# Patient Record
Sex: Female | Born: 1970 | Hispanic: No | Marital: Married | Smoking: Never smoker
Health system: Southern US, Community
[De-identification: ages and names within clinical notes are randomized; demographics above are authoritative.]

## PROBLEM LIST (undated history)

## (undated) DIAGNOSIS — I471 Supraventricular tachycardia, unspecified: Secondary | ICD-10-CM

## (undated) DIAGNOSIS — G43909 Migraine, unspecified, not intractable, without status migrainosus: Secondary | ICD-10-CM

## (undated) DIAGNOSIS — E785 Hyperlipidemia, unspecified: Secondary | ICD-10-CM

## (undated) DIAGNOSIS — R200 Anesthesia of skin: Secondary | ICD-10-CM

## (undated) DIAGNOSIS — J479 Bronchiectasis, uncomplicated: Secondary | ICD-10-CM

## (undated) DIAGNOSIS — K219 Gastro-esophageal reflux disease without esophagitis: Secondary | ICD-10-CM

## (undated) DIAGNOSIS — J449 Chronic obstructive pulmonary disease, unspecified: Secondary | ICD-10-CM

## (undated) DIAGNOSIS — I10 Essential (primary) hypertension: Secondary | ICD-10-CM

## (undated) DIAGNOSIS — D649 Anemia, unspecified: Secondary | ICD-10-CM

## (undated) HISTORY — DX: Bronchiectasis, uncomplicated: J47.9

## (undated) HISTORY — DX: Supraventricular tachycardia, unspecified: I47.10

## (undated) HISTORY — PX: LUNG BIOPSY: SHX232

## (undated) HISTORY — DX: Migraine, unspecified, not intractable, without status migrainosus: G43.909

## (undated) HISTORY — DX: Hyperlipidemia, unspecified: E78.5

## (undated) HISTORY — DX: Chronic obstructive pulmonary disease, unspecified: J44.9

## (undated) HISTORY — DX: Supraventricular tachycardia: I47.1

---

## 2014-10-27 LAB — PROTIME-INR: INR: 1.1 (ref 0.9–1.1)

## 2015-04-29 ENCOUNTER — Encounter (HOSPITAL_COMMUNITY): Payer: Self-pay | Admitting: Emergency Medicine

## 2015-04-29 ENCOUNTER — Emergency Department (HOSPITAL_COMMUNITY)
Admission: EM | Admit: 2015-04-29 | Discharge: 2015-04-29 | Disposition: A | Discharge status: Home / Self Care | Payer: Medicare (Managed Care) | Attending: Emergency Medicine | Admitting: Emergency Medicine

## 2015-04-29 DIAGNOSIS — M542 Cervicalgia: Secondary | ICD-10-CM | POA: Diagnosis present

## 2015-04-29 DIAGNOSIS — M436 Torticollis: Secondary | ICD-10-CM | POA: Insufficient documentation

## 2015-04-29 DIAGNOSIS — Z8709 Personal history of other diseases of the respiratory system: Secondary | ICD-10-CM | POA: Insufficient documentation

## 2015-04-29 MED ORDER — CYCLOBENZAPRINE HCL 10 MG PO TABS
10.0000 mg | ORAL_TABLET | Freq: Once | ORAL | Status: AC
  Administered 2015-04-29: 10 mg via ORAL
  Filled 2015-04-29: qty 1

## 2015-04-29 MED ORDER — CYCLOBENZAPRINE HCL 10 MG PO TABS: 10.0000 mg | ORAL_TABLET | Freq: Two times a day (BID) | ORAL | Status: DC | PRN

## 2015-04-29 MED ORDER — IBUPROFEN 800 MG PO TABS: 800.0000 mg | ORAL_TABLET | Freq: Three times a day (TID) | ORAL | Status: DC

## 2015-04-29 MED ORDER — IBUPROFEN 400 MG PO TABS
800.0000 mg | ORAL_TABLET | Freq: Once | ORAL | Status: AC
  Administered 2015-04-29: 800 mg via ORAL
  Filled 2015-04-29: qty 2

## 2015-04-29 NOTE — Discharge Instructions (Signed)
Acute Torticollis °Torticollis is a condition in which the muscles of the neck tighten (contract) abnormally, causing the neck to twist and the head to move into an unnatural position. Torticollis that develops suddenly is called acute torticollis. If torticollis becomes chronic and is left untreated, the face and neck can become deformed. °CAUSES °This condition may be caused by: °· Sleeping in an awkward position (common). °· Extending or twisting the neck muscles beyond their normal position. °· Infection. °In some cases, the cause may not be known. °SYMPTOMS °Symptoms of this condition include: °· An unnatural position of the head. °· Neck pain. °· A limited ability to move the neck. °· Twisting of the neck to one side. °DIAGNOSIS °This condition is diagnosed with a physical exam. You may also have imaging tests, such as an X-ray, CT scan, or MRI. °TREATMENT °Treatment for this condition involves trying to relax the neck muscles. It may include: °· Medicines or shots. °· Physical therapy. °· Surgery. This may be done in severe cases. °HOME CARE INSTRUCTIONS °· Take medicines only as directed by your health care provider. °· Do stretching exercises and massage your neck as directed by your health care provider. °· Keep all follow-up visits as directed by your health care provider. This is important. °SEEK MEDICAL CARE IF: °· You develop a fever. °SEEK IMMEDIATE MEDICAL CARE IF: °· You develop difficulty breathing. °· You develop noisy breathing (stridor). °· You start drooling. °· You have trouble swallowing or have pain with swallowing. °· You develop numbness or weakness in your hands or feet. °· You have changes in your speech, understanding, or vision. °· Your pain gets worse. °  °This information is not intended to replace advice given to you by your health care provider. Make sure you discuss any questions you have with your health care provider. °  °Document Released: 12/25/1999 Document Revised:  05/13/2014 Document Reviewed: 12/23/2013 °Elsevier Interactive Patient Education ©2016 [employer] ° °

## 2015-04-29 NOTE — ED Notes (Signed)
Pt. reports left neck pain onset this morning , denies injury , pain increases with movement /changing positions , denies fever or neck stiffness.

## 2015-04-29 NOTE — ED Provider Notes (Signed)
CSN: [id number]     Arrival date & time 04/29/15  1848 History  By signing my name below, I, Rayna Sexton, attest that this documentation has been prepared under the direction and in the presence of Domenic Moras, PA-C. Electronically Signed: Rayna Sexton, ED Scribe. 04/29/2015. 8:39 PM.   Chief Complaint  Patient presents with  . Neck Pain   The history is provided by the patient. No language interpreter was used.    HPI Comments: Hannah Barron is a 45 y.o. female who presents to the Emergency Department complaining of constant, 9/10, pinching/stabbing, atraumatic, left sided neck pain onset this morning while getting dressed. Her pain worsens with movement. She took 2x Aleve at 11:00 am without significant relief. She denies a SHx to her neck or a hx of neck injuries. She denies a hx of smoking or ETOH use as well as a FMHx of cardiac conditions. She denies fevers, chills, recent increased activity, rash, HA, CP, lightheadedness, dizziness, SOB or any other associated symptoms at this time.    Past Medical History  Diagnosis Date  . Bronchitis    Past Surgical History  Procedure Laterality Date  . Cesarean section     No family history on file. Social History  Substance Use Topics  . Smoking status: Never Smoker   . Smokeless tobacco: None  . Alcohol Use: No   OB History    No data available     Review of Systems  Constitutional: Negative for fever and chills.  Respiratory: Negative for shortness of breath.   Cardiovascular: Negative for chest pain.  Musculoskeletal: Positive for neck pain and neck stiffness.  Skin: Negative for rash.  Neurological: Negative for dizziness, light-headedness and headaches.   Allergies  Review of patient's allergies indicates no known allergies.  Home Medications   Prior to Admission medications   Not on File   BP 123/72 mmHg  Pulse 87  Temp(Src) 98 F (36.7 C) (Oral)  Resp 16  Ht 5\' 7"  (1.702 m)  Wt 169 lb (76.658 kg)  BMI 26.46  kg/m2  SpO2 98%  LMP 04/22/2015 (Approximate)    Physical Exam  Constitutional: She is oriented to person, place, and time. She appears well-developed and well-nourished.  HENT:  Head: Normocephalic and atraumatic.  Eyes: EOM are normal.  Neck: Normal range of motion. Carotid bruit is not present.  No carotid bruit   Cardiovascular: Normal rate, regular rhythm and normal heart sounds.  Exam reveals no gallop and no friction rub.   No murmur heard. Pulmonary/Chest: Effort normal and breath sounds normal. No respiratory distress. She has no wheezes. She has no rales. She exhibits no tenderness.  Abdominal: Soft.  Musculoskeletal: Normal range of motion. She exhibits edema.  No step offs or deformities of C, T or L-spine; nml left shoulder; left trapezius is mildly edematous compared to right; no warmth, erythema, induration or fluctuance  Neurological: She is alert and oriented to person, place, and time.  Skin: Skin is warm and dry.  Psychiatric: She has a normal mood and affect.  Nursing note and vitals reviewed.  ED Course  Procedures  DIAGNOSTIC STUDIES: Oxygen Saturation is 98% on RA, normal by my interpretation.    COORDINATION OF CARE: 8:36 PM Discussed next steps with pt including Flexeril, ibuprofen and a PCP referral. Return precautions noted. She verbalized understanding and is agreeable with the plan.     MDM   Final diagnoses:  Acute torticollis    BP 123/72 mmHg  Pulse  87  Temp(Src) 98 F (36.7 C) (Oral)  Resp 16  Ht 5\' 7"  (1.702 m)  Wt 76.658 kg  BMI 26.46 kg/m2  SpO2 98%  LMP 04/22/2015 (Approximate)  I personally performed the services described in this documentation, which was scribed in my presence. The recorded information has been reviewed and is accurate.     8:44 PM Patient presents with reproducible left trapezius and left paracervical spinal muscle tenderness on exam. This is likely musculoskeletal in origin. She has no carotid bruit. Low  suspicion for vertebral artery dissection or temporal arteritis. She does not have any significant cardiac history and I have low suspicion for referred pain due to cardiac pathology. No signs of infection exam no suspicion for cellulitis or abscess. Her lungs clear to auscultation and no shortness of breath. Suspect muscle skeletal strain such as torticollis. Will treat with anti-inflammatory medication and muscle relaxant with strict return precaution. Outpatient resources given as patient is requesting to find a primary care provider.   Domenic Moras, PA-C 04/29/15 2057  Davonna Belling, MD 04/30/15 [phone]

## 2015-05-08 ENCOUNTER — Emergency Department (HOSPITAL_COMMUNITY)
Admission: EM | Admit: 2015-05-08 | Discharge: 2015-05-09 | Disposition: A | Discharge status: Home / Self Care | Payer: Medicare (Managed Care) | Attending: Emergency Medicine | Admitting: Emergency Medicine

## 2015-05-08 ENCOUNTER — Encounter (HOSPITAL_COMMUNITY): Payer: Self-pay | Admitting: *Deleted

## 2015-05-08 DIAGNOSIS — N760 Acute vaginitis: Secondary | ICD-10-CM | POA: Diagnosis not present

## 2015-05-08 DIAGNOSIS — R1030 Lower abdominal pain, unspecified: Secondary | ICD-10-CM | POA: Diagnosis present

## 2015-05-08 DIAGNOSIS — N83202 Unspecified ovarian cyst, left side: Secondary | ICD-10-CM

## 2015-05-08 DIAGNOSIS — Z791 Long term (current) use of non-steroidal anti-inflammatories (NSAID): Secondary | ICD-10-CM | POA: Insufficient documentation

## 2015-05-08 DIAGNOSIS — Z8709 Personal history of other diseases of the respiratory system: Secondary | ICD-10-CM | POA: Insufficient documentation

## 2015-05-08 DIAGNOSIS — Z7951 Long term (current) use of inhaled steroids: Secondary | ICD-10-CM | POA: Insufficient documentation

## 2015-05-08 DIAGNOSIS — Z3202 Encounter for pregnancy test, result negative: Secondary | ICD-10-CM | POA: Diagnosis not present

## 2015-05-08 DIAGNOSIS — B9689 Other specified bacterial agents as the cause of diseases classified elsewhere: Secondary | ICD-10-CM

## 2015-05-08 LAB — COMPREHENSIVE METABOLIC PANEL
ALK PHOS: 49 U/L (ref 38–126)
ALT: 40 U/L (ref 14–54)
ANION GAP: 9 (ref 5–15)
AST: 28 U/L (ref 15–41)
Albumin: 3.4 g/dL — ABNORMAL LOW (ref 3.5–5.0)
BUN: 11 mg/dL (ref 6–20)
CALCIUM: 9.1 mg/dL (ref 8.9–10.3)
CHLORIDE: 106 mmol/L (ref 101–111)
CO2: 24 mmol/L (ref 22–32)
CREATININE: 0.72 mg/dL (ref 0.44–1.00)
GFR calc Af Amer: >60 mL/min (ref >60)
Glucose, Bld: 102 mg/dL — ABNORMAL HIGH (ref 65–99)
Potassium: 3.8 mmol/L (ref 3.5–5.1)
Sodium: 139 mmol/L (ref 135–145)
Total Bilirubin: 0.3 mg/dL (ref 0.3–1.2)
Total Protein: 6.7 g/dL (ref 6.5–8.1)

## 2015-05-08 LAB — CBC
HCT: 35.2 % — ABNORMAL LOW (ref 36.0–46.0)
HEMOGLOBIN: 11.8 g/dL — AB (ref 12.0–15.0)
MCH: 31.3 pg (ref 26.0–34.0)
MCHC: 33.5 g/dL (ref 30.0–36.0)
MCV: 93.4 fL (ref 78.0–100.0)
PLATELETS: 267 10*3/uL (ref 150–400)
RBC: 3.77 MIL/uL — AB (ref 3.87–5.11)
RDW: 12.4 % (ref 11.5–15.5)
WBC: 6.1 10*3/uL (ref 4.0–10.5)

## 2015-05-08 LAB — WET PREP, GENITAL
Sperm: NONE SEEN
TRICH WET PREP: NONE SEEN
YEAST WET PREP: NONE SEEN

## 2015-05-08 LAB — LIPASE, BLOOD: LIPASE: 33 U/L (ref 11–51)

## 2015-05-08 LAB — I-STAT BETA HCG BLOOD, ED (MC, WL, AP ONLY)

## 2015-05-08 MED ORDER — ONDANSETRON HCL 4 MG/2ML IJ SOLN
4.0000 mg | Freq: Once | INTRAMUSCULAR | Status: AC
  Administered 2015-05-09: 4 mg via INTRAVENOUS
  Filled 2015-05-08: qty 2

## 2015-05-08 MED ORDER — METRONIDAZOLE 500 MG PO TABS: 500.0000 mg | ORAL_TABLET | Freq: Two times a day (BID) | ORAL | Status: DC

## 2015-05-08 MED ORDER — MORPHINE SULFATE (PF) 4 MG/ML IV SOLN
4.0000 mg | Freq: Once | INTRAVENOUS | Status: AC
  Administered 2015-05-09: 4 mg via INTRAVENOUS
  Filled 2015-05-08: qty 1

## 2015-05-08 NOTE — ED Provider Notes (Signed)
CSN: [id number]     Arrival date & time 05/08/15  [id number] History   First MD Initiated Contact with Patient 05/08/15 2149     Chief Complaint  Patient presents with  . Abdominal Pain    HPI Pt is a 45 y.o. female with history of c-section and known fibroid who presents for abdominal pain x1 day. Pt reports that pain started after dinner yesterday and was a sharp pain in her lower abdomen radiating around to her left flank. She has been nauseated but no vomiting or diarrhea. She denies fevers but does report that she was sweating a lot all night. She reports some intermittent vaginal discharge and itching for the past few weeks. She reports her last period was 4/10 but was just 2 days of spotting and not a normal period. She reports having a fibroid but no symptoms from this previously. She is not on any birth control and is sexually active with her husband who is present in exam room. They recently moved to [facility] from [place] and are originally from [place].   Past Medical History  Diagnosis Date  . Bronchitis    Past Surgical History  Procedure Laterality Date  . Cesarean section     History reviewed. No pertinent family history. Social History  Substance Use Topics  . Smoking status: Never Smoker   . Smokeless tobacco: None  . Alcohol Use: No   OB History    No data available     Review of Systems  All other systems reviewed and are negative.  See HPI   Allergies  Review of patient's allergies indicates no known allergies.  Home Medications   Prior to Admission medications   Medication Sig Start Date End Date Taking? Authorizing Provider  budesonide-formoterol (SYMBICORT) 80-4.5 MCG/ACT inhaler Inhale 2 puffs into the lungs 2 (two) times daily.   Yes Historical Provider, MD  ibuprofen (ADVIL,MOTRIN) 800 MG tablet Take 1 tablet (800 mg total) by mouth 3 (three) times daily. 04/29/15  Yes Domenic Moras, PA-C  cyclobenzaprine (FLEXERIL) 10 MG tablet Take 1 tablet (10 mg total)  by mouth 2 (two) times daily as needed for muscle spasms. Patient not taking: Reported on 05/08/2015 04/29/15   Domenic Moras, PA-C   BP 110/67 mmHg  Pulse 100  Temp(Src) 98.9 F (37.2 C) (Oral)  Resp 20  SpO2 99%  LMP 04/22/2015 (Approximate) Physical Exam  Constitutional: She is oriented to person, place, and time. She appears well-developed and well-nourished. No distress.  HENT:  Head: Normocephalic and atraumatic.  Right Ear: External ear normal.  Left Ear: External ear normal.  Nose: Nose normal.  Mouth/Throat: Oropharynx is clear and moist.  Eyes: Conjunctivae are normal. Pupils are equal, round, and reactive to light. Right eye exhibits no discharge. Left eye exhibits no discharge. No scleral icterus.  Neck: Normal range of motion. Neck supple.  Cardiovascular: Normal rate, regular rhythm, normal heart sounds and intact distal pulses.   No murmur heard. Pulmonary/Chest: Effort normal and breath sounds normal. No respiratory distress. She has no wheezes.  Abdominal: Soft. Normal appearance and bowel sounds are normal. She exhibits no distension and no mass. There is no hepatosplenomegaly. There is tenderness in the right lower quadrant, suprapubic area and left lower quadrant. There is no rigidity, no rebound, no guarding and no CVA tenderness.  Genitourinary: Pelvic exam was performed with patient supine. There is no rash, tenderness or lesion on the right labia. There is no rash, tenderness or lesion on the  left labia. Uterus is enlarged. Cervix exhibits motion tenderness. Cervix exhibits no discharge and no friability. Right adnexum displays no mass and no fullness. Left adnexum displays no mass and no fullness. No tenderness or bleeding in the vagina. Vaginal discharge found.  Tenderness throughout lower abdomen, unable to localize to specific adnexa or uterus, does endorse CMT but hard to distinguish from diffuse tenderness  Musculoskeletal: She exhibits no edema.  Neurological: She  is alert and oriented to person, place, and time.  Skin: Skin is warm and dry. No rash noted. She is not diaphoretic. No pallor.  Psychiatric: She has a normal mood and affect. Her behavior is normal.  Nursing note and vitals reviewed.   ED Course  Procedures (including critical care time) Labs Review Labs Reviewed  COMPREHENSIVE METABOLIC PANEL - Abnormal; Notable for the following:    Glucose, Bld 102 (*)    Albumin 3.4 (*)    All other components within normal limits  CBC - Abnormal; Notable for the following:    RBC 3.77 (*)    Hemoglobin 11.8 (*)    HCT 35.2 (*)    All other components within normal limits  WET PREP, GENITAL  LIPASE, BLOOD  URINALYSIS, ROUTINE W REFLEX MICROSCOPIC (NOT AT [facility])  I-STAT BETA HCG BLOOD, ED ([facility], [facility], [facility] ONLY)  GC/CHLAMYDIA PROBE AMP ([place]) NOT AT [facility]    Imaging Review No results found. I have personally reviewed and evaluated these images and lab results as part of my medical decision-making.   EKG Interpretation None      MDM   Final diagnoses:  None   45 y.o. female with history of asymptomatic fibroid who presents for 1 day of lower abdominal pain, Very tender on exam. Pelvic exam done with copious gray/white vaginal discharge and generalized tenderness limited specificity of bimanual. GC/CT and wet prep sent. Will obtain CT abd/pelv given significant tenderness without clear etiology.  CT abd/pelv to be followed up by Antonietta Breach, PA. If no findings to explain pain would treat empirically for PID  Frazier Richards, MD 05/10/15 [id number]  Elnora Morrison, MD 05/10/15 2111

## 2015-05-08 NOTE — ED Notes (Signed)
Pt reports lower abd pain that radiates around to left side. Pain started last night. Having nausea but denies vomiting, diarrhea or urinary symptoms. Reports vaginal itching but no discharge.

## 2015-05-09 ENCOUNTER — Emergency Department (HOSPITAL_COMMUNITY): Payer: Medicare (Managed Care)

## 2015-05-09 DIAGNOSIS — N83202 Unspecified ovarian cyst, left side: Secondary | ICD-10-CM | POA: Diagnosis not present

## 2015-05-09 LAB — URINALYSIS, ROUTINE W REFLEX MICROSCOPIC
BILIRUBIN URINE: NEGATIVE
Glucose, UA: NEGATIVE mg/dL
Hgb urine dipstick: NEGATIVE
KETONES UR: NEGATIVE mg/dL
NITRITE: NEGATIVE
PH: 6 (ref 5.0–8.0)
PROTEIN: NEGATIVE mg/dL
Specific Gravity, Urine: 1.013 (ref 1.005–1.030)

## 2015-05-09 LAB — URINE MICROSCOPIC-ADD ON: RBC / HPF: NONE SEEN RBC/hpf (ref 0–5)

## 2015-05-09 IMAGING — CT CT ABD-PELV W/ CM
2 of 5 series · 12 of 46 positions shown, 14 images · IV contrast (Iodine)
Comparison: None.

CLINICAL DATA: Lower abdominal pain radiating to LEFT-side with
tenderness for 2 days.

EXAM:
CT ABDOMEN AND PELVIS WITH CONTRAST
TECHNIQUE: Multidetector CT imaging of the abdomen and pelvis was performed
using the standard protocol following bolus administration of
intravenous contrast.
CONTRAST:  100 cc [VQ] IOPAMIDOL ([VQ]) INJECTION 61%

[Series 201: routine, idose (2) · axial · 0.68mm/px · z∈[+93,+443]mm · 9 of 82 slices shown, 11 images]
[im 6/82  soft-tissue]
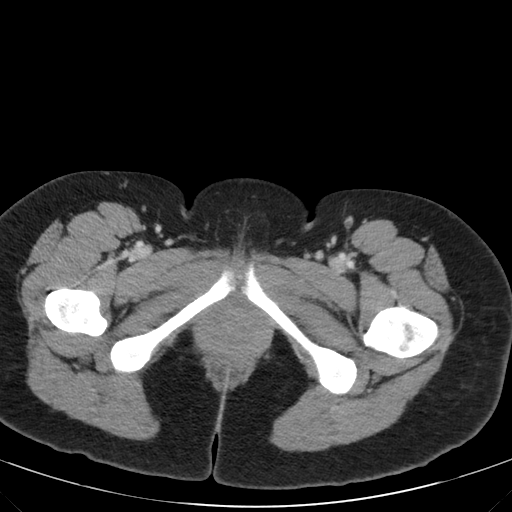
[im 6/82  bone]
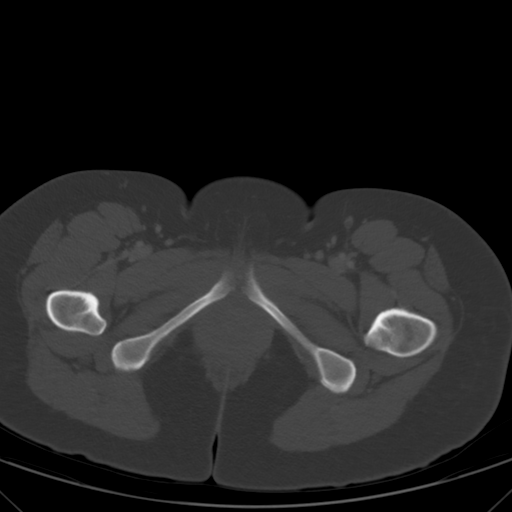
[im 18/82  soft-tissue]
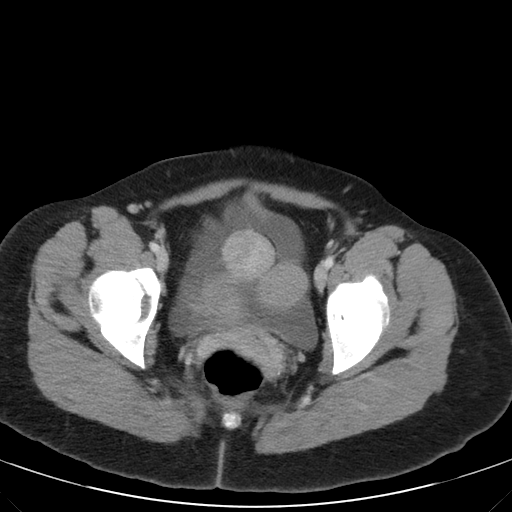
[im 24/82  soft-tissue]
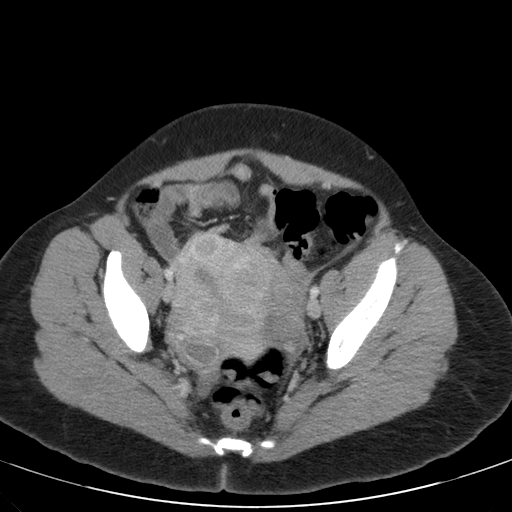
[im 35/82  soft-tissue]
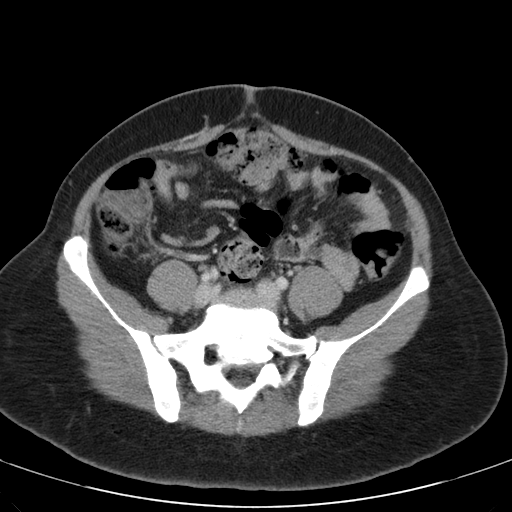
[im 41/82  soft-tissue]
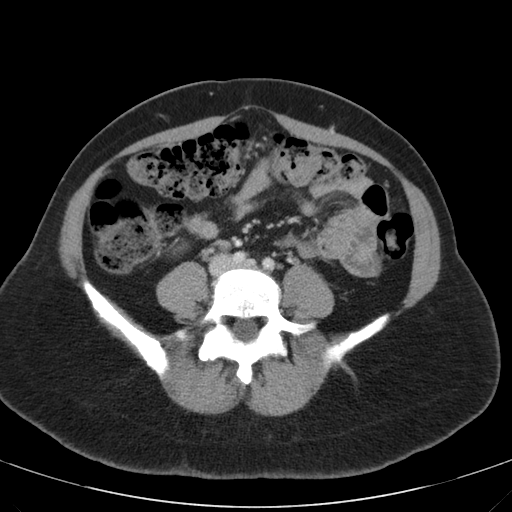
[im 47/82  soft-tissue]
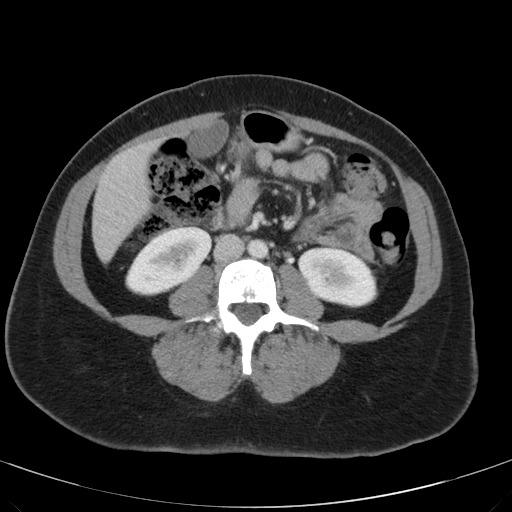
[im 58/82  soft-tissue]
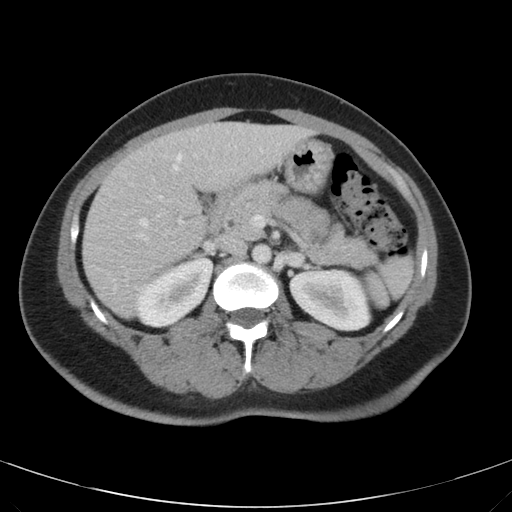
[im 64/82  soft-tissue]
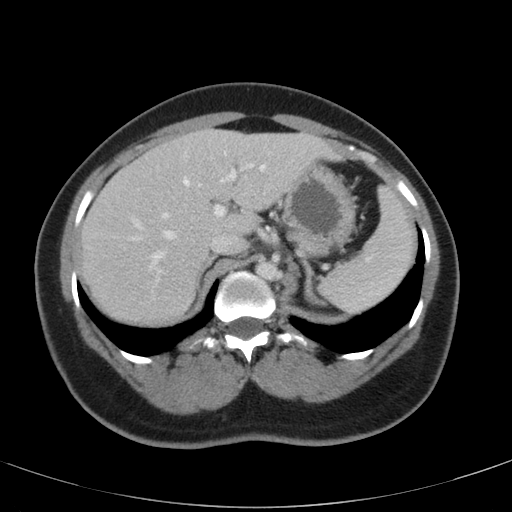
[im 76/82  soft-tissue]
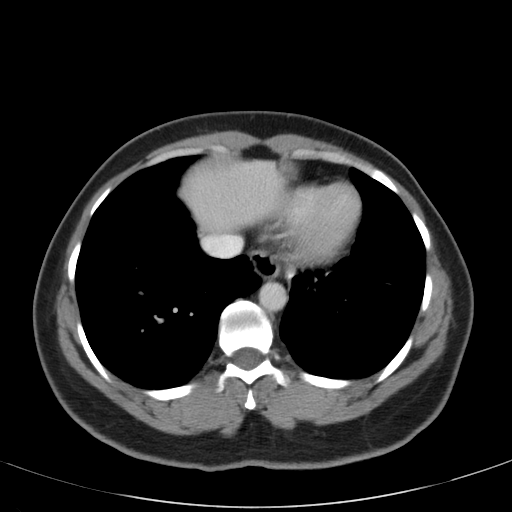
[im 76/82  bone]
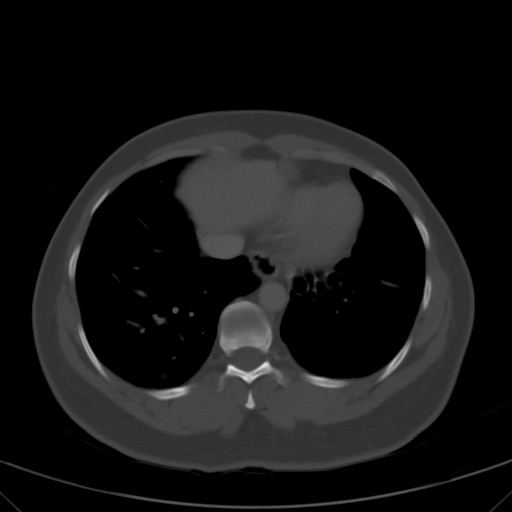

[Series 203: coronals, idose (2) · coronal · 0.45mm/px · 3 of 121 slices shown]
[im 41/121  soft-tissue]
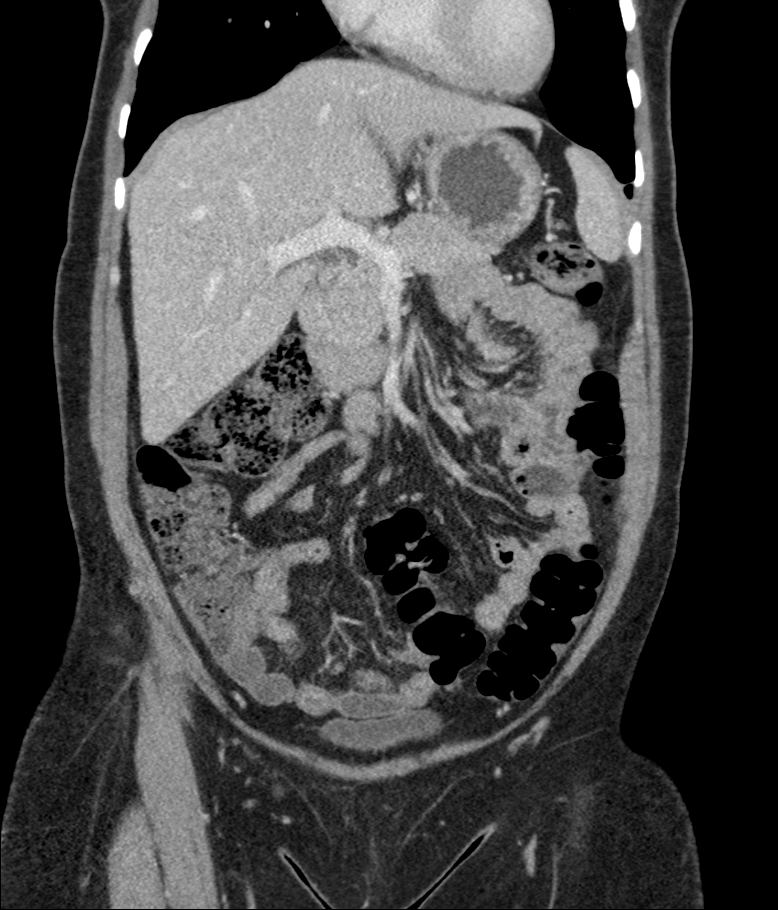
[im 54/121  soft-tissue]
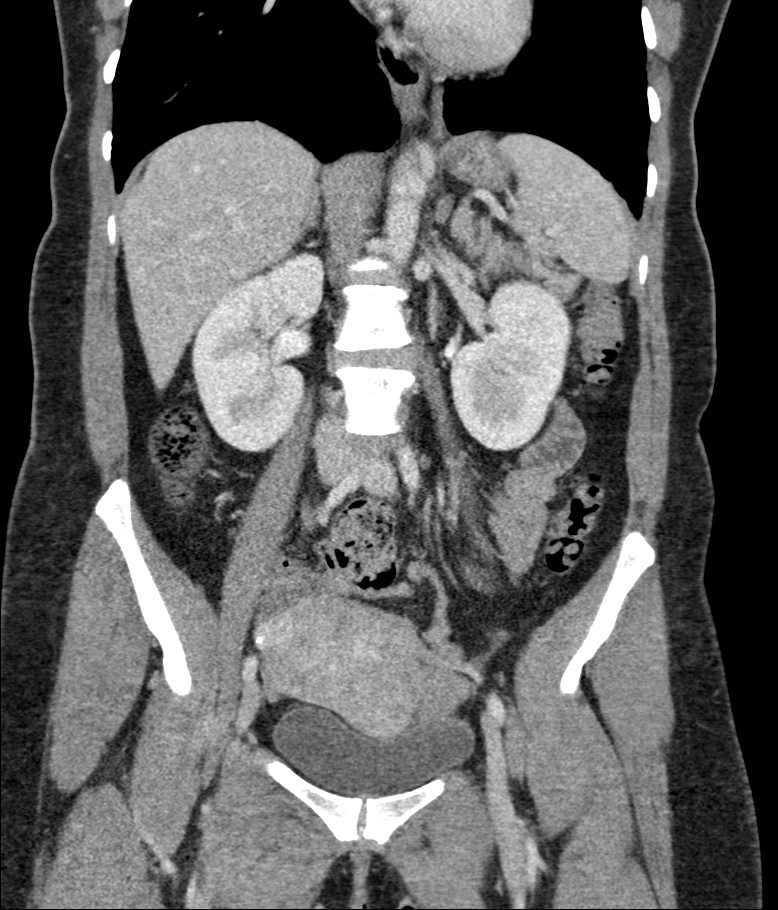
[im 67/121  soft-tissue]
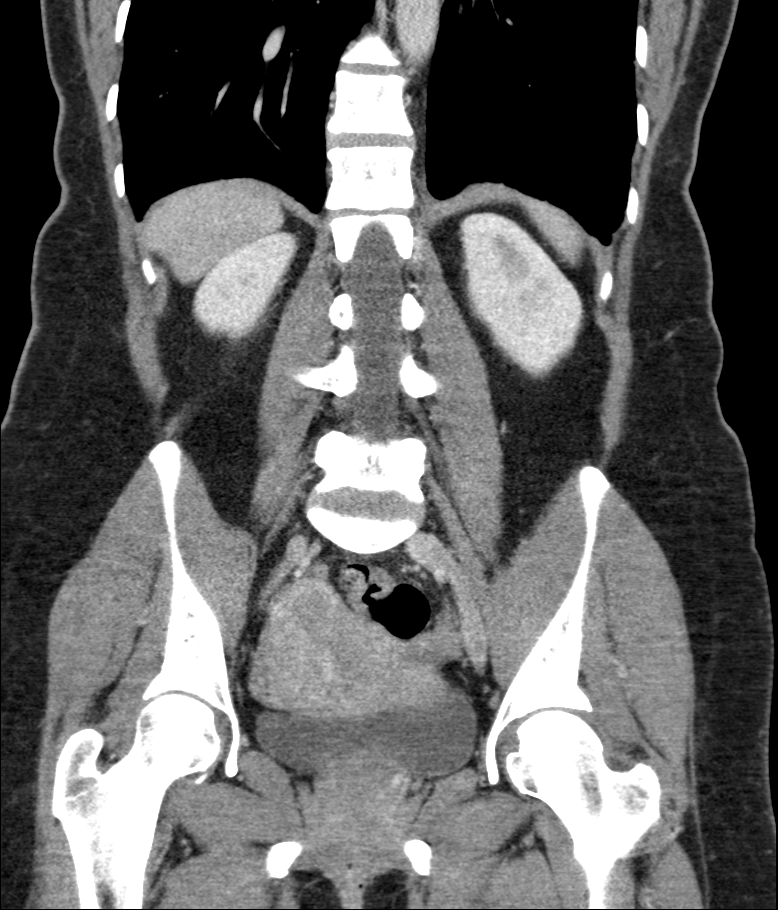

[12 of 46 positions shown; findings below may reference images not displayed]

FINDINGS: LUNG BASES: Heterogeneous lung attenuation in the lung bases most
compatible with small airway disease. 4 mm nodular thickening along
LEFT fissure. LEFT lower lobe bronchiectasis. Bilateral lower lobe
interlobular septal thickening.

SOLID ORGANS: The liver, spleen, gallbladder, pancreas and adrenal
glands are unremarkable.

GASTROINTESTINAL TRACT: Small hiatal hernia. The stomach, small and
large bowel are normal in course and caliber without inflammatory
changes. Moderate amount of retained large bowel stool. Normal
appendix.

KIDNEYS/ URINARY TRACT: Kidneys are orthotopic, demonstrating
symmetric enhancement. No nephrolithiasis, hydronephrosis or solid
renal masses. The unopacified ureters are normal in course and
caliber. Delayed imaging through the kidneys demonstrates symmetric
prompt contrast excretion within the proximal urinary collecting
system. Urinary bladder is partially distended and unremarkable.

PERITONEUM/RETROPERITONEUM: Aortoiliac vessels are normal in course
and caliber. No lymphadenopathy by CT size criteria. Lobulated
uterus with multiple intramural mass is compatible with leiomyomata.
Involuting RIGHT adnexal corpus luteal cyst. Homogeneously mildly
dense 4.5 x 3.7 cm LEFT adnexa versus sub serosal leiomyoma. No
intraperitoneal free fluid nor free air.

SOFT TISSUE/OSSEOUS STRUCTURES: Non-suspicious. Small fat containing
umbilical hernia.
IMPRESSION: Multiple uterine leiomyomata. 4.5 x 3.7 cm LEFT adnexae of possible
hemorrhagic cyst, endometrioma versus sub serosal leiomyoma.

Moderate amount of retained large bowel stool without bowel
obstruction.

Level lower lobe bronchiectasis with bibasilar interlobular septal
thickening. Findings would be better characterized on dedicated CT
of the chest on a nonemergent basis.

## 2015-05-09 MED ORDER — IOPAMIDOL (ISOVUE-300) INJECTION 61%
INTRAVENOUS | Status: AC
  Administered 2015-05-09: 100 mL
  Filled 2015-05-09: qty 100

## 2015-05-09 MED ORDER — SODIUM CHLORIDE 0.9 % IV BOLUS (SEPSIS)
500.0000 mL | Freq: Once | INTRAVENOUS | Status: AC
  Administered 2015-05-09: 500 mL via INTRAVENOUS

## 2015-05-09 MED ORDER — TRAMADOL HCL 50 MG PO TABS: 50.0000 mg | ORAL_TABLET | Freq: Four times a day (QID) | ORAL | Status: DC | PRN

## 2015-05-09 MED ORDER — NAPROXEN 500 MG PO TABS
500.0000 mg | ORAL_TABLET | Freq: Two times a day (BID) | ORAL | Status: DC
Start: 2015-05-09 — End: 2015-06-04

## 2015-05-09 NOTE — ED Notes (Signed)
Patient transported to CT 

## 2015-05-09 NOTE — ED Notes (Signed)
Pt departed in NAD.  

## 2015-05-09 NOTE — ED Provider Notes (Signed)
2:49 AM Patient care assumed from Beverlyn Roux, MD, at shift change. Patient pending CT to evaluate for etiology of LLQ abdominal pain. Patient without fever or leukocytosis. No UTI. Wet prep c/w bacterial vaginosis.  CT findings reviewed which show multiple uterine fibroids. There is also evidence of a LEFT hemorrhagic cyst vs endometrioma vs sub serosal leiomyoma. Location of these findings does correlate with area of patient's pain. There was concern for potential STIs; however, patient has rare WBCs. Given reassuring work up, discharge likely solely secondary to [place]. Will treat with flagyl. GC/Chlamydia pending. Patient informed that she will be notified if these return positive.  Plan to d/c with OBGYN follow up. Patient given Rx for Naproxen and short course of Tramadol for pain control. Return precautions discussed and provided. Patient discharged in satisfactory condition with no unaddressed concerns.   Filed Vitals:   05/09/15 0100 05/09/15 0130 05/09/15 0145 05/09/15 0231  BP: 128/81 119/76 120/84 119/62  Pulse: 86 76 82 82  Temp:      TempSrc:      Resp: 14   16  SpO2: 100% 99% 100% 100%    Results for orders placed or performed during the hospital encounter of 05/08/15  Wet prep, genital  Result Value Ref Range   Yeast Wet Prep HPF POC NONE SEEN NONE SEEN   Trich, Wet Prep NONE SEEN NONE SEEN   Clue Cells Wet Prep HPF POC PRESENT (A) NONE SEEN   WBC, Wet Prep HPF POC RARE (A) NONE SEEN   Sperm NONE SEEN   Lipase, blood  Result Value Ref Range   Lipase 33 11 - 51 U/L  Comprehensive metabolic panel  Result Value Ref Range   Sodium 139 135 - 145 mmol/L   Potassium 3.8 3.5 - 5.1 mmol/L   Chloride 106 101 - 111 mmol/L   CO2 24 22 - 32 mmol/L   Glucose, Bld 102 (H) 65 - 99 mg/dL   BUN 11 6 - 20 mg/dL   Creatinine, Ser 0.72 0.44 - 1.00 mg/dL   Calcium 9.1 8.9 - 10.3 mg/dL   Total Protein 6.7 6.5 - 8.1 g/dL   Albumin 3.4 (L) 3.5 - 5.0 g/dL   AST 28 15 - 41 U/L   ALT 40 14  - 54 U/L   Alkaline Phosphatase 49 38 - 126 U/L   Total Bilirubin 0.3 0.3 - 1.2 mg/dL   GFR calc non Af Amer >60 >60 mL/min   GFR calc Af Amer >60 >60 mL/min   Anion gap 9 5 - 15  CBC  Result Value Ref Range   WBC 6.1 4.0 - 10.5 K/uL   RBC 3.77 (L) 3.87 - 5.11 MIL/uL   Hemoglobin 11.8 (L) 12.0 - 15.0 g/dL   HCT 35.2 (L) 36.0 - 46.0 %   MCV 93.4 78.0 - 100.0 fL   MCH 31.3 26.0 - 34.0 pg   MCHC 33.5 30.0 - 36.0 g/dL   RDW 12.4 11.5 - 15.5 %   Platelets 267 150 - 400 K/uL  Urinalysis, Routine w reflex microscopic  Result Value Ref Range   Color, Urine YELLOW YELLOW   APPearance CLEAR CLEAR   Specific Gravity, Urine 1.013 1.005 - 1.030   pH 6.0 5.0 - 8.0   Glucose, UA NEGATIVE NEGATIVE mg/dL   Hgb urine dipstick NEGATIVE NEGATIVE   Bilirubin Urine NEGATIVE NEGATIVE   Ketones, ur NEGATIVE NEGATIVE mg/dL   Protein, ur NEGATIVE NEGATIVE mg/dL   Nitrite NEGATIVE NEGATIVE   Leukocytes,  UA TRACE (A) NEGATIVE  Urine microscopic-add on  Result Value Ref Range   Squamous Epithelial / LPF 0-5 (A) NONE SEEN   WBC, UA 0-5 0 - 5 WBC/hpf   RBC / HPF NONE SEEN 0 - 5 RBC/hpf   Bacteria, UA RARE (A) NONE SEEN  I-Stat beta hCG blood, ED  Result Value Ref Range   I-stat hCG, quantitative <5.0 <5 mIU/mL   Comment 3           Ct Abdomen Pelvis W Contrast  05/09/2015  CLINICAL DATA:  Lower abdominal pain radiating to LEFT-side with tenderness for 2 days. EXAM: CT ABDOMEN AND PELVIS WITH CONTRAST TECHNIQUE: Multidetector CT imaging of the abdomen and pelvis was performed using the standard protocol following bolus administration of intravenous contrast. CONTRAST:  100 cc ISOVUE-300 IOPAMIDOL (ISOVUE-300) INJECTION 61% COMPARISON:  None. FINDINGS: LUNG BASES: Heterogeneous lung attenuation in the lung bases most compatible with small airway disease. 4 mm nodular thickening along LEFT fissure. LEFT lower lobe bronchiectasis. Bilateral lower lobe interlobular septal thickening. SOLID ORGANS: The liver,  spleen, gallbladder, pancreas and adrenal glands are unremarkable. GASTROINTESTINAL TRACT: Small hiatal hernia. The stomach, small and large bowel are normal in course and caliber without inflammatory changes. Moderate amount of retained large bowel stool. Normal appendix. KIDNEYS/ URINARY TRACT: Kidneys are orthotopic, demonstrating symmetric enhancement. No nephrolithiasis, hydronephrosis or solid renal masses. The unopacified ureters are normal in course and caliber. Delayed imaging through the kidneys demonstrates symmetric prompt contrast excretion within the proximal urinary collecting system. Urinary bladder is partially distended and unremarkable. PERITONEUM/RETROPERITONEUM: Aortoiliac vessels are normal in course and caliber. No lymphadenopathy by CT size criteria. Lobulated uterus with multiple intramural mass is compatible with leiomyomata. Involuting RIGHT adnexal corpus luteal cyst. Homogeneously mildly dense 4.5 x 3.7 cm LEFT adnexa versus sub serosal leiomyoma. No intraperitoneal free fluid nor free air. SOFT TISSUE/OSSEOUS STRUCTURES: Non-suspicious. Small fat containing umbilical hernia. IMPRESSION: Multiple uterine leiomyomata. 4.5 x 3.7 cm LEFT adnexae of possible hemorrhagic cyst, endometrioma versus sub serosal leiomyoma. Moderate amount of retained large bowel stool without bowel obstruction. Level lower lobe bronchiectasis with bibasilar interlobular septal thickening. Findings would be better characterized on dedicated CT of the chest on a nonemergent basis. Electronically Signed   By: Elon Alas M.D.   On: 05/09/2015 02:32      Antonietta Breach, PA-C 05/09/15 0302  Orpah Greek, MD 05/09/15 [phone]

## 2015-05-09 NOTE — Discharge Instructions (Signed)
Bacterial Vaginosis  Bacterial vaginosis is a vaginal infection that occurs when the normal balance of bacteria in the vagina is disrupted. It results from an overgrowth of certain bacteria. This is the most common vaginal infection in women of childbearing age. Treatment is important to prevent complications, especially in pregnant women, as it can cause a premature delivery.  CAUSES   Bacterial vaginosis is caused by an increase in harmful bacteria that are normally present in smaller amounts in the vagina. Several different kinds of bacteria can cause bacterial vaginosis. However, the reason that the condition develops is not fully understood.  RISK FACTORS  Certain activities or behaviors can put you at an increased risk of developing bacterial vaginosis, including:   Having a new sex partner or multiple sex partners.   Douching.   Using an intrauterine device (IUD) for contraception.  Women do not get bacterial vaginosis from toilet seats, bedding, swimming pools, or contact with objects around them.  SIGNS AND SYMPTOMS   Some women with bacterial vaginosis have no signs or symptoms. Common symptoms include:   Grey vaginal discharge.   A fishlike odor with discharge, especially after sexual intercourse.   Itching or burning of the vagina and vulva.   Burning or pain with urination.  DIAGNOSIS   Your health care provider will take a medical history and examine the vagina for signs of bacterial vaginosis. A sample of vaginal fluid may be taken. Your health care provider will look at this sample under a microscope to check for bacteria and abnormal cells. A vaginal pH test may also be done.   TREATMENT   Bacterial vaginosis may be treated with antibiotic medicines. These may be given in the form of a pill or a vaginal cream. A second round of antibiotics may be prescribed if the condition comes back after treatment. Because bacterial vaginosis increases your risk for sexually transmitted diseases, getting  treated can help reduce your risk for chlamydia, gonorrhea, HIV, and herpes.  HOME CARE INSTRUCTIONS    Only take over-the-counter or prescription medicines as directed by your health care provider.   If antibiotic medicine was prescribed, take it as directed. Make sure you finish it even if you start to feel better.   Tell all sexual partners that you have a vaginal infection. They should see their health care provider and be treated if they have problems, such as a mild rash or itching.   During treatment, it is important that you follow these instructions:   Avoid sexual activity or use condoms correctly.   Do not douche.   Avoid alcohol as directed by your health care provider.   Avoid breastfeeding as directed by your health care provider.  SEEK MEDICAL CARE IF:    Your symptoms are not improving after 3 days of treatment.   You have increased discharge or pain.   You have a fever.  MAKE SURE YOU:    Understand these instructions.   Will watch your condition.   Will get help right away if you are not doing well or get worse.  FOR MORE INFORMATION   Centers for Disease Control and Prevention, Division of STD Prevention: www.cdc.gov/std  American Sexual Health Association (ASHA): www.ashastd.org      This information is not intended to replace advice given to you by your health care provider. Make sure you discuss any questions you have with your health care provider.     Document Released: 12/27/2004 Document Revised: 01/17/2014 Document Reviewed: 08/08/2012    Elsevier Interactive Patient Education 2016 [employer] Inc[employer]  Ovarian Cyst  An ovarian cyst is a fluid-filled sac that forms on an ovary. The ovaries are small organs that produce eggs in women. Various types of cysts can form on the ovaries. Most are not cancerous. Many do not cause problems, and they often go away on their own. Some may cause symptoms and require treatment. Common types of ovarian cysts include:   Functional cysts--These  cysts may occur every month during the menstrual cycle. This is normal. The cysts usually go away with the next menstrual cycle if the woman does not get pregnant. Usually, there are no symptoms with a functional cyst.   Endometrioma cysts--These cysts form from the tissue that lines the uterus. They are also called "chocolate cysts" because they become filled with blood that turns brown. This type of cyst can cause pain in the lower abdomen during intercourse and with your menstrual period.   Cystadenoma cysts--This type develops from the cells on the outside of the ovary. These cysts can get very big and cause lower abdomen pain and pain with intercourse. This type of cyst can twist on itself, cut off its blood supply, and cause severe pain. It can also easily rupture and cause a lot of pain.   Dermoid cysts--This type of cyst is sometimes found in both ovaries. These cysts may contain different kinds of body tissue, such as skin, teeth, hair, or cartilage. They usually do not cause symptoms unless they get very big.   Theca lutein cysts--These cysts occur when too much of a certain hormone (human chorionic gonadotropin) is produced and overstimulates the ovaries to produce an egg. This is most common after procedures used to assist with the conception of a baby (in vitro fertilization).  CAUSES    Fertility drugs can cause a condition in which multiple large cysts are formed on the ovaries. This is called ovarian hyperstimulation syndrome.   A condition called polycystic ovary syndrome can cause hormonal imbalances that can lead to nonfunctional ovarian cysts.  SIGNS AND SYMPTOMS   Many ovarian cysts do not cause symptoms. If symptoms are present, they may include:   Pelvic pain or pressure.   Pain in the lower abdomen.   Pain during sexual intercourse.   Increasing girth (swelling) of the abdomen.   Abnormal menstrual periods.   Increasing pain with menstrual periods.   Stopping having menstrual  periods without being pregnant.  DIAGNOSIS   These cysts are commonly found during a routine or annual pelvic exam. Tests may be ordered to find out more about the cyst. These tests may include:   Ultrasound.   X-ray of the pelvis.   CT scan.   MRI.   Blood tests.  TREATMENT   Many ovarian cysts go away on their own without treatment. Your health care provider may want to check your cyst regularly for 2-3 months to see if it changes. For women in menopause, it is particularly important to monitor a cyst closely because of the higher rate of ovarian cancer in menopausal women. When treatment is needed, it may include any of the following:   A procedure to drain the cyst (aspiration). This may be done using a long needle and ultrasound. It can also be done through a laparoscopic procedure. This involves using a thin, lighted tube with a tiny camera on the end (laparoscope) inserted through a small incision.   Surgery to remove the whole cyst. This may be done using laparoscopic   surgery or an open surgery involving a larger incision in the lower abdomen.   Hormone treatment or birth control pills. These methods are sometimes used to help dissolve a cyst.  HOME CARE INSTRUCTIONS    Only take over-the-counter or prescription medicines as directed by your health care provider.   Follow up with your health care provider as directed.   Get regular pelvic exams and Pap tests.  SEEK MEDICAL CARE IF:    Your periods are late, irregular, or painful, or they stop.   Your pelvic pain or abdominal pain does not go away.   Your abdomen becomes larger or swollen.   You have pressure on your bladder or trouble emptying your bladder completely.   You have pain during sexual intercourse.   You have feelings of fullness, pressure, or discomfort in your stomach.   You lose weight for no apparent reason.   You feel generally ill.   You become constipated.   You lose your appetite.   You develop acne.   You have an  increase in body and facial hair.   You are gaining weight, without changing your exercise and eating habits.   You think you are pregnant.  SEEK IMMEDIATE MEDICAL CARE IF:    You have increasing abdominal pain.   You feel sick to your stomach (nauseous), and you throw up (vomit).   You develop a fever that comes on suddenly.   You have abdominal pain during a bowel movement.   Your menstrual periods become heavier than usual.  MAKE SURE YOU:   Understand these instructions.   Will watch your condition.   Will get help right away if you are not doing well or get worse.     This information is not intended to replace advice given to you by your health care provider. Make sure you discuss any questions you have with your health care provider.     Document Released: 12/27/2004 Document Revised: 01/01/2013 Document Reviewed: 09/03/2012  Elsevier Interactive Patient Education 2016 Elsevier Inc.

## 2015-05-11 LAB — GC/CHLAMYDIA PROBE AMP (~~LOC~~) NOT AT ARMC
Chlamydia: NEGATIVE
Neisseria Gonorrhea: NEGATIVE

## 2015-06-04 ENCOUNTER — Other Ambulatory Visit (INDEPENDENT_AMBULATORY_CARE_PROVIDER_SITE_OTHER): Payer: Medicare Other

## 2015-06-04 ENCOUNTER — Ambulatory Visit (INDEPENDENT_AMBULATORY_CARE_PROVIDER_SITE_OTHER): Payer: Medicare Other | Admitting: Family

## 2015-06-04 ENCOUNTER — Encounter: Payer: Self-pay | Admitting: Family

## 2015-06-04 ENCOUNTER — Telehealth: Payer: Self-pay | Admitting: Family

## 2015-06-04 VITALS — BP 112/70 | HR 96 | Temp 98.2°F | Resp 16 | Ht 67.0 in | Wt 167.0 lb

## 2015-06-04 DIAGNOSIS — J449 Chronic obstructive pulmonary disease, unspecified: Secondary | ICD-10-CM | POA: Insufficient documentation

## 2015-06-04 DIAGNOSIS — R5383 Other fatigue: Secondary | ICD-10-CM

## 2015-06-04 DIAGNOSIS — J42 Unspecified chronic bronchitis: Secondary | ICD-10-CM | POA: Diagnosis not present

## 2015-06-04 LAB — CBC
HEMATOCRIT: 39.2 % (ref 36.0–46.0)
HEMOGLOBIN: 13.3 g/dL (ref 12.0–15.0)
MCHC: 34 g/dL (ref 30.0–36.0)
MCV: 92.4 fl (ref 78.0–100.0)
PLATELETS: 294 10*3/uL (ref 150.0–400.0)
RBC: 4.24 Mil/uL (ref 3.87–5.11)
RDW: 13.1 % (ref 11.5–15.5)
WBC: 5.2 10*3/uL (ref 4.0–10.5)

## 2015-06-04 LAB — FERRITIN: Ferritin: 24.6 ng/mL (ref 10.0–291.0)

## 2015-06-04 LAB — IBC PANEL
Iron: 121 ug/dL (ref 42–145)
Saturation Ratios: 29.5 % (ref 20.0–50.0)
Transferrin: 293 mg/dL (ref 212.0–360.0)

## 2015-06-04 LAB — B12 AND FOLATE PANEL
FOLATE: 14.8 ng/mL (ref >5.9)
Vitamin B-12: 1001 pg/mL — ABNORMAL HIGH (ref 211–911)

## 2015-06-04 LAB — HEMOGLOBIN A1C: HEMOGLOBIN A1C: 5.9 % (ref 4.6–6.5)

## 2015-06-04 LAB — TSH: TSH: 1.03 u[IU]/mL (ref 0.35–4.50)

## 2015-06-04 MED ORDER — FLUTICASONE FUROATE-VILANTEROL 100-25 MCG/INH IN AEPB: 1.0000 | INHALATION_SPRAY | Freq: Every day | RESPIRATORY_TRACT | Status: DC

## 2015-06-04 MED ORDER — ALBUTEROL SULFATE HFA 108 (90 BASE) MCG/ACT IN AERS: 2.0000 | INHALATION_SPRAY | Freq: Four times a day (QID) | RESPIRATORY_TRACT | Status: DC | PRN

## 2015-06-04 NOTE — Assessment & Plan Note (Signed)
Symptoms of fatigue with concern for metabolic, respiratory, cardiovascular, or psychological origin. Obtain CBC, B12, ferritin, IBC panel, A1c, and TSH to rule out metabolic causes. Cannot rule out underlying COPD as a cause for her fatigue or the possibility of depression or underlying cardiovascular disease. She does sleep adequately with questionable duration. Follow-up pending blood work.

## 2015-06-04 NOTE — Assessment & Plan Note (Signed)
COPD w/ chronic bronchitis and possible bronchiectasis per patient description with need for pulmonary function test. Appears labile and may be a cause of her fatigue. Will trial Breo. Hold Symbicort. Refill albuterol as needed. Refer to pulmonology to establish.

## 2015-06-04 NOTE — Telephone Encounter (Signed)
Please inform patient that her blood work shows that her white/red blood cells, iron levels, B12, and hemoglobin A1c are all within the normal limits. Therefore the fatigue she is experiencing is most likely related to her COPD, but we cannot rule out depression or underlying cardiovascular disease. Please continue to follow up with pulmonology for her COPD as discussed.

## 2015-06-04 NOTE — Progress Notes (Signed)
Subjective:    Patient ID: Hannah Barron, female    DOB: August 09, 1970, 45 y.o.   MRN: [id number]  Chief Complaint  Patient presents with  . Establish Care    referral to pulmonology for COPD with, almost every night she has body aches and night sweats and feels sick, feels tired through the day    HPI:  Hannah Barron is a 45 y.o. female who  has a past medical history of Bronchitis; Migraines; and COPD (chronic obstructive pulmonary disease) ([place]). and presents today for an office visit to establish care.  1.) COPD - Previously diagnosed in 2003 with COPD with chronic bronchitis. Currently maintained on Symbicort. Reports taking the medication as prescribed and denies adverse side effects. Continues to experience the associated symptom of shortness of breath. Has had a previous diagnosis of bronciectasis. States that she is due for a pulmonary function test and lung x-ray.  2.) Fatigue - Associated symptom of fatigue has been going on for about 2 months. Describes that the fatigue has "made her sleep". Also notes that prior to going to bed she feels tightness in her chest every night. Even with mild exertion she experiences fatigue. Denies and snoring or periods of apena. Feels mildly sleepy during the day. There are no modifying factors/treatments attempted that make it beter.   No Known Allergies   Outpatient Prescriptions Prior to Visit  Medication Sig Dispense Refill  . budesonide-formoterol (SYMBICORT) 80-4.5 MCG/ACT inhaler Inhale 2 puffs into the lungs 2 (two) times daily.    . cyclobenzaprine (FLEXERIL) 10 MG tablet Take 1 tablet (10 mg total) by mouth 2 (two) times daily as needed for muscle spasms. (Patient not taking: Reported on 05/08/2015) 20 tablet 0  . ibuprofen (ADVIL,MOTRIN) 800 MG tablet Take 1 tablet (800 mg total) by mouth 3 (three) times daily. 21 tablet 0  . metroNIDAZOLE (FLAGYL) 500 MG tablet Take 1 tablet (500 mg total) by mouth 2 (two) times daily. 14 tablet 0  .  naproxen (NAPROSYN) 500 MG tablet Take 1 tablet (500 mg total) by mouth 2 (two) times daily. 30 tablet 0  . traMADol (ULTRAM) 50 MG tablet Take 1 tablet (50 mg total) by mouth every 6 (six) hours as needed. 15 tablet 0   No facility-administered medications prior to visit.     Past Medical History  Diagnosis Date  . Bronchitis   . Migraines   . COPD (chronic obstructive pulmonary disease) [facility])      Past Surgical History  Procedure Laterality Date  . Cesarean section    . Lung biopsy       Family History  Problem Relation Age of Onset  . Healthy Mother   . Healthy Father      Social History   Social History  . Marital Status: Married    Spouse Name: N/A  . Number of Children: 1  . Years of Education: 12   Occupational History  . Not on file.   Social History Main Topics  . Smoking status: Never Smoker   . Smokeless tobacco: Never Used  . Alcohol Use: No  . Drug Use: No  . Sexual Activity: Not on file   Other Topics Concern  . Not on file   Social History Narrative   Fun: Watch TV      Review of Systems  Constitutional: Positive for fatigue. Negative for fever and chills.  Respiratory: Positive for shortness of breath. Negative for chest tightness.   Cardiovascular: Negative for chest pain,  palpitations and leg swelling.  Neurological: Negative for dizziness, weakness and numbness.      Objective:    BP 112/70 mmHg  Pulse 96  Temp(Src) 98.2 F (36.8 C) (Oral)  Resp 16  Ht 5\' 7"  (1.702 m)  Wt 167 lb (75.751 kg)  BMI 26.15 kg/m2  SpO2 98% Nursing note and vital signs reviewed.  Physical Exam  Constitutional: She is oriented to person, place, and time. She appears well-developed and well-nourished. No distress.  Cardiovascular: Normal rate, regular rhythm, normal heart sounds and intact distal pulses.   Pulmonary/Chest: Effort normal and breath sounds normal.  Neurological: She is alert and oriented to person, place, and time.  Skin: Skin  is warm and dry.  Psychiatric: She has a normal mood and affect. Her behavior is normal. Judgment and thought content normal.       Assessment & Plan:   Problem List Items Addressed This Visit      Respiratory   COPD (chronic obstructive pulmonary disease) ([place]) - Primary    COPD w/ chronic bronchitis and possible bronchiectasis per patient description with need for pulmonary function test. Appears labile and may be a cause of her fatigue. Will trial Breo. Hold Symbicort. Refill albuterol as needed. Refer to pulmonology to establish.       Relevant Medications   albuterol (PROVENTIL HFA;VENTOLIN HFA) 108 (90 Base) MCG/ACT inhaler   fluticasone furoate-vilanterol (BREO ELLIPTA) 100-25 MCG/INH AEPB   Other Relevant Orders   Ambulatory referral to Pulmonology     Other   Fatigue    Symptoms of fatigue with concern for metabolic, respiratory, cardiovascular, or psychological origin. Obtain CBC, B12, ferritin, IBC panel, A1c, and TSH to rule out metabolic causes. Cannot rule out underlying COPD as a cause for her fatigue or the possibility of depression or underlying cardiovascular disease. She does sleep adequately with questionable duration. Follow-up pending blood work.      Relevant Orders   CBC   Ferritin   Hemoglobin A1c   IBC panel   TSH   B12 and Folate Panel       I have discontinued Ms. Jeanmarie's cyclobenzaprine, ibuprofen, metroNIDAZOLE, naproxen, and traMADol. I am also having her start on albuterol and fluticasone furoate-vilanterol. Additionally, I am having her maintain her budesonide-formoterol, dicyclomine, and omeprazole.   Meds ordered this encounter  Medications  . dicyclomine (BENTYL) 20 MG tablet    Sig: Take 20 mg by mouth every 6 (six) hours.  Marland Kitchen omeprazole (PRILOSEC) 40 MG capsule    Sig: Take 40 mg by mouth daily.  Marland Kitchen albuterol (PROVENTIL HFA;VENTOLIN HFA) 108 (90 Base) MCG/ACT inhaler    Sig: Inhale 2 puffs into the lungs every 6 (six) hours as needed  for wheezing or shortness of breath.    Dispense:  1 Inhaler    Refill:  3    Order Specific Question:  Supervising Provider    Answer:  Pricilla Holm A [id number]  . fluticasone furoate-vilanterol (BREO ELLIPTA) 100-25 MCG/INH AEPB    Sig: Inhale 1 puff into the lungs daily.    Dispense:  14 each    Refill:  0    Order Specific Question:  Supervising Provider    Answer:  Pricilla Holm A [id number]     Follow-up: Return in about 1 month (around 07/05/2015), or if symptoms worsen or fail to improve.  Mauricio Po, FNP

## 2015-06-04 NOTE — Progress Notes (Signed)
Pre visit review using our clinic review tool, if applicable. No additional management support is needed unless otherwise documented below in the visit note. 

## 2015-06-04 NOTE — Patient Instructions (Addendum)
Thank you for choosing [facility].  Summary/Instructions:  Please continue to take your medications as prescribed.   They will call to schedule your pulmonology appointment.   Try the Breo - 1x per day.   Your prescription(s) have been submitted to your pharmacy or been printed and provided for you. Please take as directed and contact our office if you believe you are having problem(s) with the medication(s) or have any questions.  Please stop by the lab on the basement level of the building for your blood work. Your results will be released to Hannah Barron (or called to you) after review, usually within 72 hours after test completion. If any changes need to be made, you will be notified at that same time.  If your symptoms worsen or fail to improve, please contact our office for further instruction, or in case of emergency go directly to the emergency room at the closest medical facility.

## 2015-06-05 NOTE — Telephone Encounter (Signed)
LVM letting pt know lab results.

## 2015-06-11 ENCOUNTER — Ambulatory Visit (INDEPENDENT_AMBULATORY_CARE_PROVIDER_SITE_OTHER): Payer: Medicare Other | Admitting: Obstetrics & Gynecology

## 2015-06-11 ENCOUNTER — Encounter: Payer: Self-pay | Admitting: Obstetrics & Gynecology

## 2015-06-11 VITALS — BP 108/75 | HR 115 | Ht 67.0 in | Wt 166.1 lb

## 2015-06-11 DIAGNOSIS — N83202 Unspecified ovarian cyst, left side: Secondary | ICD-10-CM

## 2015-06-11 DIAGNOSIS — R102 Pelvic and perineal pain: Secondary | ICD-10-CM | POA: Diagnosis not present

## 2015-06-11 DIAGNOSIS — D259 Leiomyoma of uterus, unspecified: Secondary | ICD-10-CM

## 2015-06-11 MED ORDER — IBUPROFEN 600 MG PO TABS: 600.0000 mg | ORAL_TABLET | Freq: Four times a day (QID) | ORAL | Status: DC | PRN

## 2015-06-11 NOTE — Patient Instructions (Signed)
Ovarian Cyst An ovarian cyst is a fluid-filled sac that forms on an ovary. The ovaries are small organs that produce eggs in women. Various types of cysts can form on the ovaries. Most are not cancerous. Many do not cause problems, and they often go away on their own. Some may cause symptoms and require treatment. Common types of ovarian cysts include:  Functional cysts--These cysts may occur every month during the menstrual cycle. This is normal. The cysts usually go away with the next menstrual cycle if the woman does not get pregnant. Usually, there are no symptoms with a functional cyst.  Endometrioma cysts--These cysts form from the tissue that lines the uterus. They are also called "chocolate cysts" because they become filled with blood that turns brown. This type of cyst can cause pain in the lower abdomen during intercourse and with your menstrual period.  Cystadenoma cysts--This type develops from the cells on the outside of the ovary. These cysts can get very big and cause lower abdomen pain and pain with intercourse. This type of cyst can twist on itself, cut off its blood supply, and cause severe pain. It can also easily rupture and cause a lot of pain.  Dermoid cysts--This type of cyst is sometimes found in both ovaries. These cysts may contain different kinds of body tissue, such as skin, teeth, hair, or cartilage. They usually do not cause symptoms unless they get very big.  Theca lutein cysts--These cysts occur when too much of a certain hormone (human chorionic gonadotropin) is produced and overstimulates the ovaries to produce an egg. This is most common after procedures used to assist with the conception of a baby (in vitro fertilization). CAUSES   Fertility drugs can cause a condition in which multiple large cysts are formed on the ovaries. This is called ovarian hyperstimulation syndrome.  A condition called polycystic ovary syndrome can cause hormonal imbalances that can lead to  nonfunctional ovarian cysts. SIGNS AND SYMPTOMS  Many ovarian cysts do not cause symptoms. If symptoms are present, they may include:  Pelvic pain or pressure.  Pain in the lower abdomen.  Pain during sexual intercourse.  Increasing girth (swelling) of the abdomen.  Abnormal menstrual periods.  Increasing pain with menstrual periods.  Stopping having menstrual periods without being pregnant. DIAGNOSIS  These cysts are commonly found during a routine or annual pelvic exam. Tests may be ordered to find out more about the cyst. These tests may include:  Ultrasound.  X-ray of the pelvis.  CT scan.  MRI.  Blood tests. TREATMENT  Many ovarian cysts go away on their own without treatment. Your health care provider may want to check your cyst regularly for 2-3 months to see if it changes. For women in menopause, it is particularly important to monitor a cyst closely because of the higher rate of ovarian cancer in menopausal women. When treatment is needed, it may include any of the following:  A procedure to drain the cyst (aspiration). This may be done using a long needle and ultrasound. It can also be done through a laparoscopic procedure. This involves using a thin, lighted tube with a tiny camera on the end (laparoscope) inserted through a small incision.  Surgery to remove the whole cyst. This may be done using laparoscopic surgery or an open surgery involving a larger incision in the lower abdomen.  Hormone treatment or birth control pills. These methods are sometimes used to help dissolve a cyst. HOME CARE INSTRUCTIONS   Only take over-the-counter   or prescription medicines as directed by your health care provider.  Follow up with your health care provider as directed.  Get regular pelvic exams and Pap tests. SEEK MEDICAL CARE IF:   Your periods are late, irregular, or painful, or they stop.  Your pelvic pain or abdominal pain does not go away.  Your abdomen becomes  larger or swollen.  You have pressure on your bladder or trouble emptying your bladder completely.  You have pain during sexual intercourse.  You have feelings of fullness, pressure, or discomfort in your stomach.  You lose weight for no apparent reason.  You feel generally ill.  You become constipated.  You lose your appetite.  You develop acne.  You have an increase in body and facial hair.  You are gaining weight, without changing your exercise and eating habits.  You think you are pregnant. SEEK IMMEDIATE MEDICAL CARE IF:   You have increasing abdominal pain.  You feel sick to your stomach (nauseous), and you throw up (vomit).  You develop a fever that comes on suddenly.  You have abdominal pain during a bowel movement.  Your menstrual periods become heavier than usual. MAKE SURE YOU:  Understand these instructions.  Will watch your condition.  Will get help right away if you are not doing well or get worse.   This information is not intended to replace advice given to you by your health care provider. Make sure you discuss any questions you have with your health care provider.   Document Released: 12/27/2004 Document Revised: 01/01/2013 Document Reviewed: 09/03/2012 Elsevier Interactive Patient Education 2016 Elsevier Inc.  

## 2015-06-11 NOTE — Progress Notes (Addendum)
Patient ID: Hannah Barron, female   DOB: Feb 27, 1970, 45 y.o.   MRN: [id number] History:  45 y.o. G1P1001 here today for eval of pelvic pain.  Pt also reports inability to conceive.  Her husband is in his 48's but, has multiple children.   Pt reports no change in her cycles.  Taking NSAIDS for her pain with some relief.  The following portions of the patient's history were reviewed and updated as appropriate: allergies, current medications, past family history, past medical history, past social history, past surgical history and problem list.  Review of Systems:  Pertinent items are noted in HPI.  Objective:  Physical Exam Blood pressure 108/75, pulse 115, height 5\' 7"  (1.702 m), weight 166 lb 1.6 oz (75.342 kg), last menstrual period 06/11/2015. Gen: NAD Abd: Soft, nondistended. Positive tenderness in LLQ. Pelvic: Normal appearing external genitalia; normal appearing vaginal mucosa and cervix.  Normal discharge.  Small uterus, no uterine there is left adnexal tenderness and fullness on the LEFT side  04/2014 Labs and Imaging CLINICAL DATA: Lower abdominal pain radiating to LEFT-side with tenderness for 2 days.  EXAM: CT ABDOMEN AND PELVIS WITH CONTRAST  TECHNIQUE: Multidetector CT imaging of the abdomen and pelvis was performed using the standard protocol following bolus administration of intravenous contrast.  CONTRAST: 100 cc ISOVUE-300 IOPAMIDOL (ISOVUE-300) INJECTION 61%  COMPARISON: None.  FINDINGS: LUNG BASES: Heterogeneous lung attenuation in the lung bases most compatible with small airway disease. 4 mm nodular thickening along LEFT fissure. LEFT lower lobe bronchiectasis. Bilateral lower lobe interlobular septal thickening.  SOLID ORGANS: The liver, spleen, gallbladder, pancreas and adrenal glands are unremarkable.  GASTROINTESTINAL TRACT: Small hiatal hernia. The stomach, small and large bowel are normal in course and caliber without inflammatory changes.  Moderate amount of retained large bowel stool. Normal appendix.  KIDNEYS/ URINARY TRACT: Kidneys are orthotopic, demonstrating symmetric enhancement. No nephrolithiasis, hydronephrosis or solid renal masses. The unopacified ureters are normal in course and caliber. Delayed imaging through the kidneys demonstrates symmetric prompt contrast excretion within the proximal urinary collecting system. Urinary bladder is partially distended and unremarkable.  PERITONEUM/RETROPERITONEUM: Aortoiliac vessels are normal in course and caliber. No lymphadenopathy by CT size criteria. Lobulated uterus with multiple intramural mass is compatible with leiomyomata. Involuting RIGHT adnexal corpus luteal cyst. Homogeneously mildly dense 4.5 x 3.7 cm LEFT adnexa versus sub serosal leiomyoma. No intraperitoneal free fluid nor free air.  SOFT TISSUE/OSSEOUS STRUCTURES: Non-suspicious. Small fat containing umbilical hernia.  IMPRESSION: Multiple uterine leiomyomata. 4.5 x 3.7 cm LEFT adnexae of possible hemorrhagic cyst, endometrioma versus sub serosal leiomyoma.   Moderate amount of retained large bowel stool without bowel obstruction.  Level lower lobe bronchiectasis with bibasilar interlobular septal thickening. Findings would be better characterized on dedicated CT of the chest on a nonemergent basis.   Assessment & Plan:  Pelvic pain and adnexal mass on CT Uterine fibroids    Pelvic sono F/u in 2 weeks or sooner prn  Addendum: upon review of the record that pt brought with her from [facility] care in [place] it appears that pt was scheduled to have or had an endometrial ablation in 2016. Need to inquire of pt on her return visit.    Rimsha Trembley L. Harraway-Smith, M.D., Cherlynn June

## 2015-06-17 ENCOUNTER — Encounter: Payer: Self-pay | Admitting: General Practice

## 2015-06-19 ENCOUNTER — Ambulatory Visit (HOSPITAL_COMMUNITY)
Admission: RE | Admit: 2015-06-19 | Discharge: 2015-06-19 | Disposition: A | Discharge status: Home / Self Care | Payer: Medicare Other | Source: Ambulatory Visit | Attending: Obstetrics & Gynecology | Admitting: Obstetrics & Gynecology

## 2015-06-19 DIAGNOSIS — D252 Subserosal leiomyoma of uterus: Secondary | ICD-10-CM | POA: Insufficient documentation

## 2015-06-19 DIAGNOSIS — R102 Pelvic and perineal pain: Secondary | ICD-10-CM | POA: Insufficient documentation

## 2015-06-19 DIAGNOSIS — D251 Intramural leiomyoma of uterus: Secondary | ICD-10-CM | POA: Insufficient documentation

## 2015-06-19 IMAGING — US US TRANSVAGINAL NON-OB
1 series · 15 of 25 positions shown · non-contrast
Comparison: CT on [DATE]

CLINICAL DATA: Pelvic pain in female.  Fibroids.  LMP [DATE].

EXAM:
TRANSABDOMINAL AND TRANSVAGINAL ULTRASOUND OF PELVIS
TECHNIQUE: Both transabdominal and transvaginal ultrasound examinations of the
pelvis were performed. Transabdominal technique was performed for
global imaging of the pelvis including uterus, ovaries, adnexal
regions, and pelvic cul-de-sac. It was necessary to proceed with
endovaginal exam following the transabdominal exam to visualize the
endometrial stripe and ovaries.

[Series 1: us transvaginal non-ob · 15 of 75 slices shown]
[im 1/75]
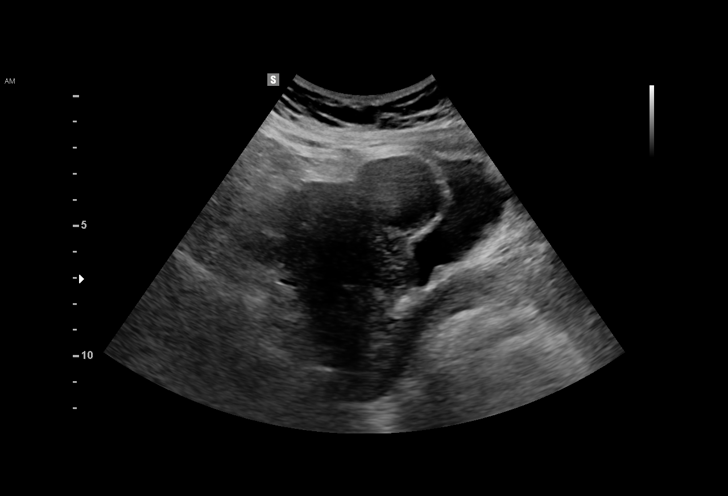
[im 7/75]
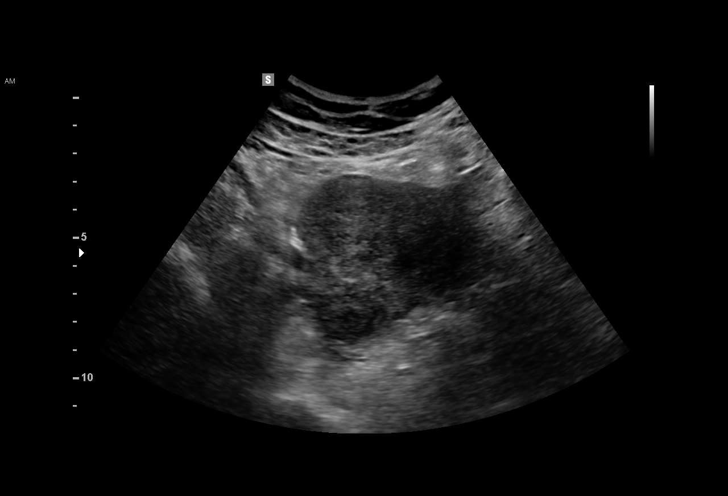
[im 13/75]
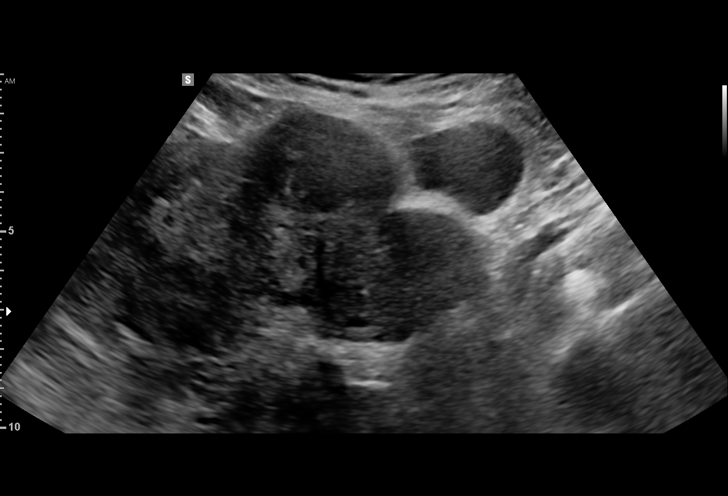
[im 16/75]
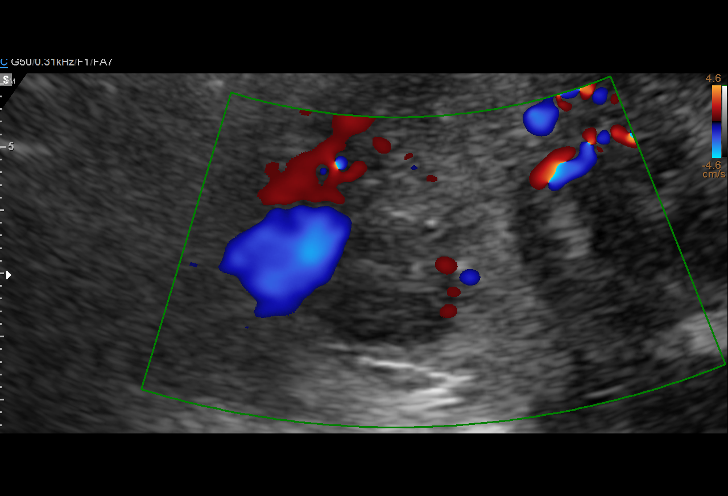
[im 22/75]
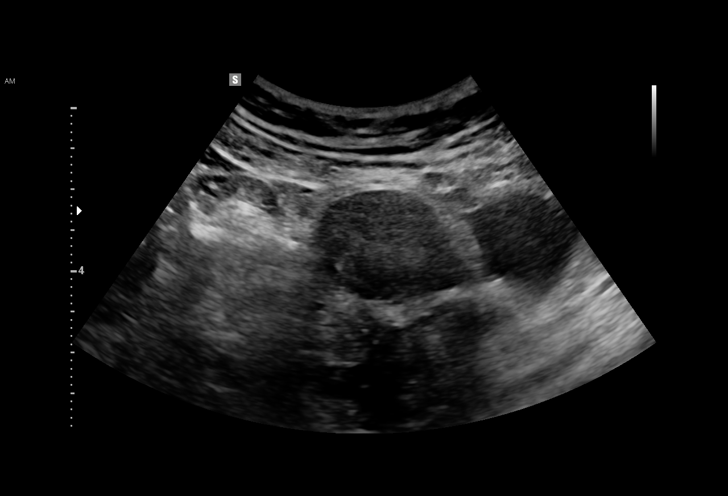
[im 28/75]
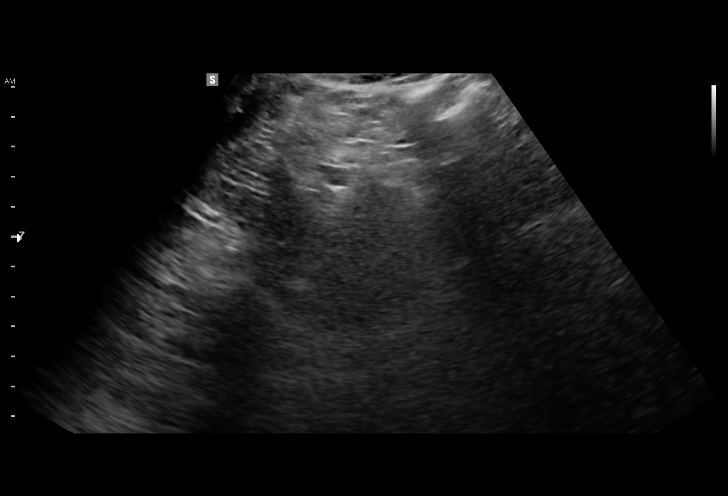
[im 31/75]
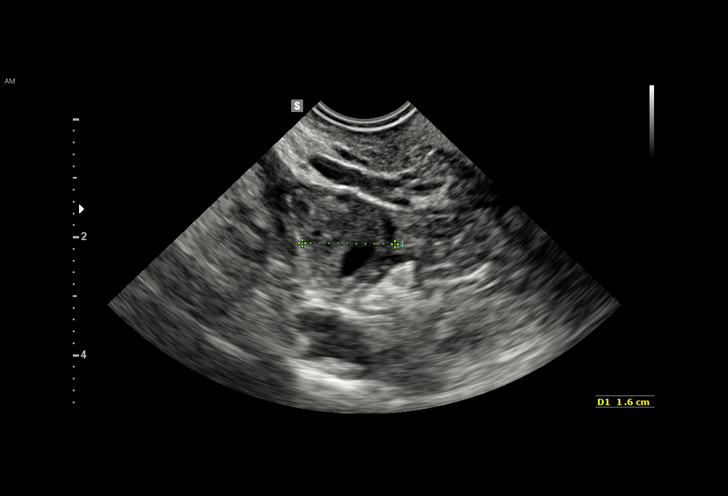
[im 38/75]
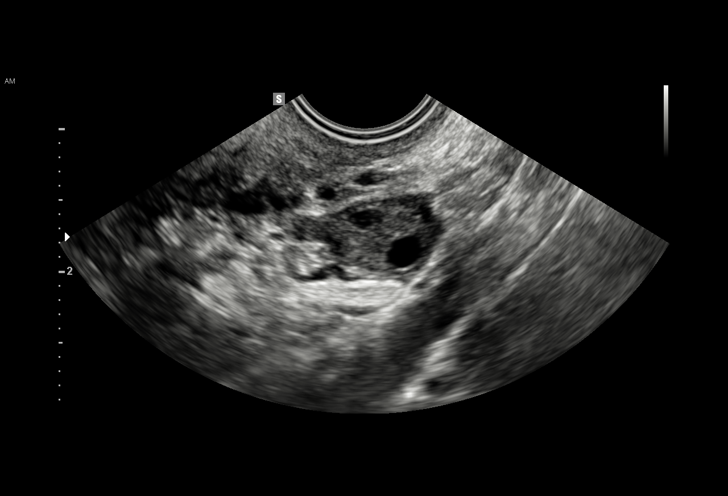
[im 44/75]
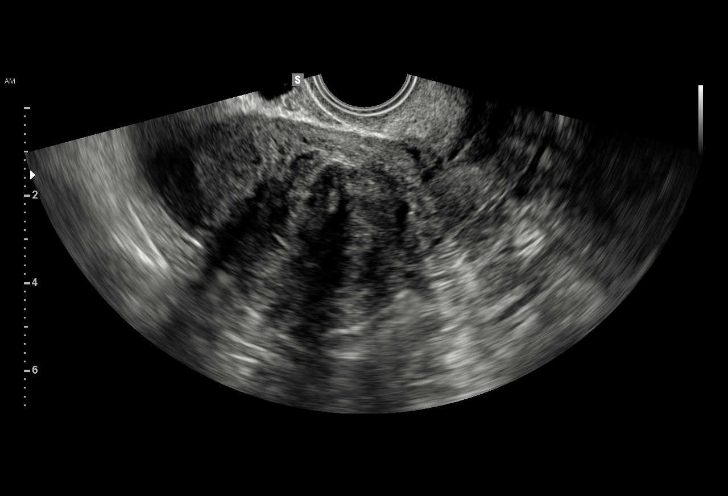
[im 47/75]
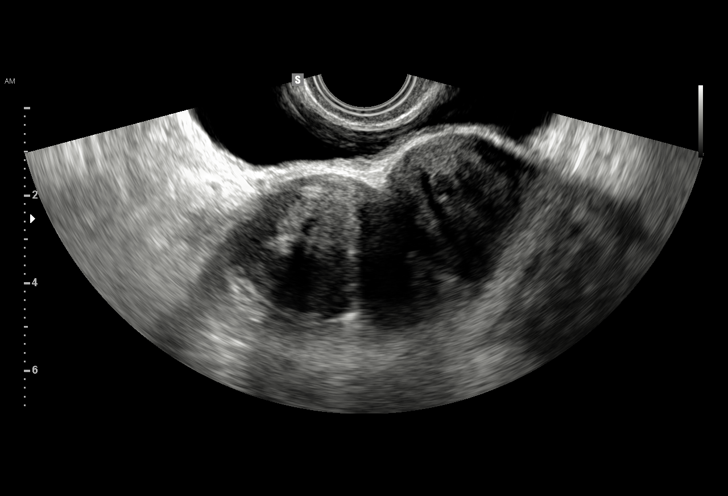
[im 53/75]
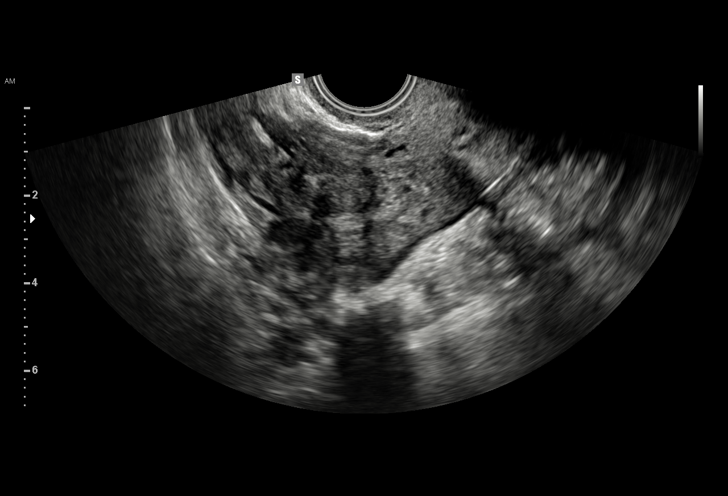
[im 59/75]
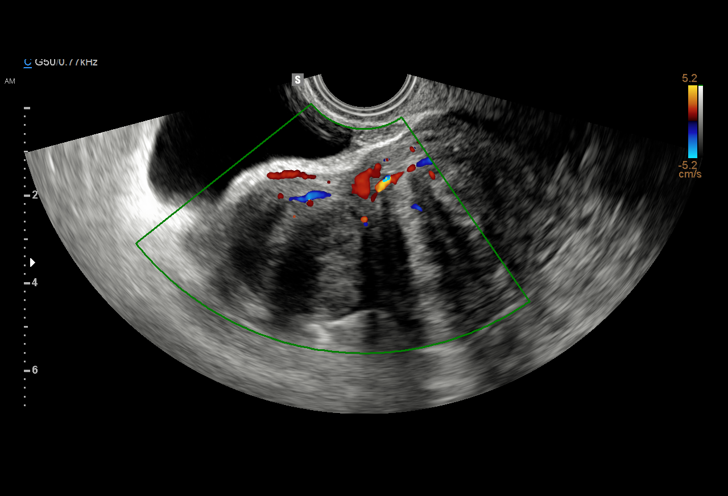
[im 62/75]
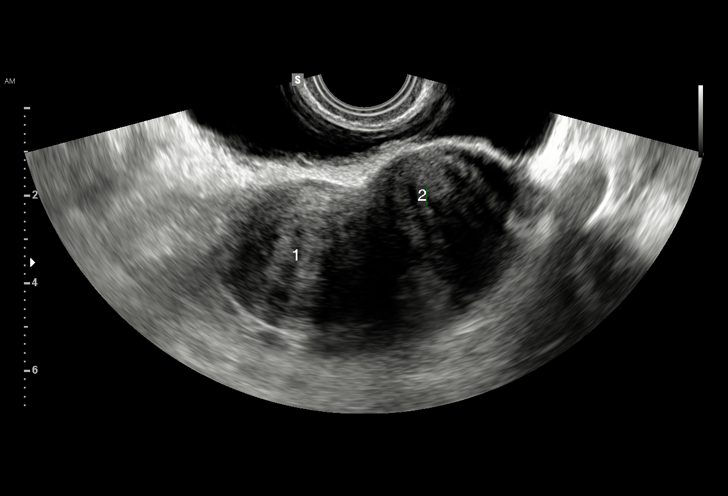
[im 68/75]
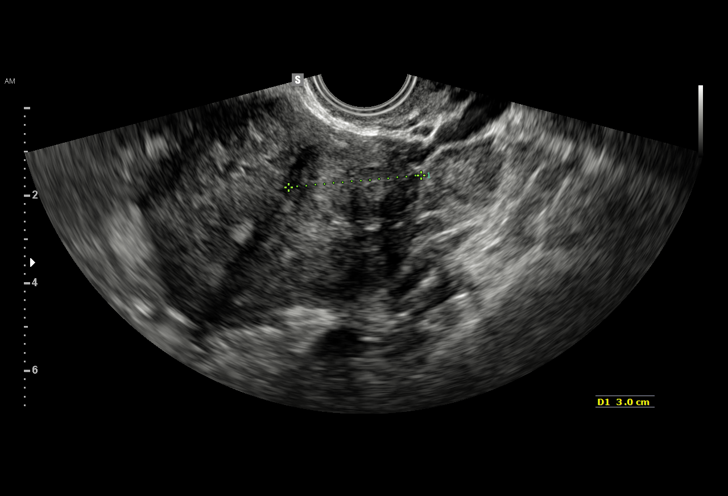
[im 75/75]
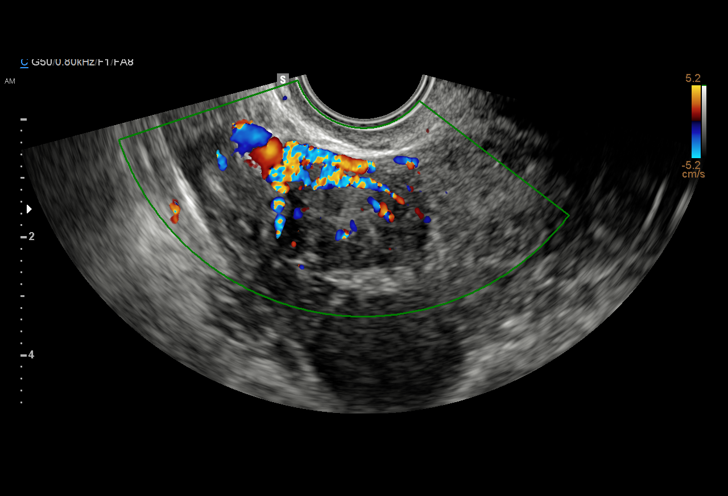

[15 of 25 positions shown; findings below may reference images not displayed]

FINDINGS: Uterus

Measurements: 9.6 x 5.0 x 7.8 cm.. Several intramural and subserosal
uterine fibroids are seen. The largest fibroids are subserosal in
location, within the uterine fundus measuring 4.0 cm in the left
lateral corpus measuring 3.8 cm.

Endometrium

Thickness: 6 mm.  No focal abnormality visualized.

Right ovary

Measurements: 3.0 x 1.9 x 1.6 cm. Normal appearance/no adnexal mass.

Left ovary

Measurements: 3.5 x 1.1 x 1.7 cm. Normal appearance/no adnexal mass.

Other findings

No abnormal free fluid.
IMPRESSION: Several intramural and subserosal uterine fibroids, largest
measuring 4 cm.

Normal appearance of both ovaries.  No adnexal mass identified.

## 2015-06-25 ENCOUNTER — Ambulatory Visit: Payer: Medicare Other | Admitting: Obstetrics & Gynecology

## 2015-07-08 ENCOUNTER — Encounter: Payer: Self-pay | Admitting: Pulmonary Disease

## 2015-07-08 ENCOUNTER — Ambulatory Visit (INDEPENDENT_AMBULATORY_CARE_PROVIDER_SITE_OTHER): Payer: Medicare Other | Admitting: Pulmonary Disease

## 2015-07-08 ENCOUNTER — Other Ambulatory Visit (INDEPENDENT_AMBULATORY_CARE_PROVIDER_SITE_OTHER): Payer: Medicare Other

## 2015-07-08 VITALS — BP 132/78 | HR 96 | Ht 67.0 in | Wt 171.0 lb

## 2015-07-08 DIAGNOSIS — J470 Bronchiectasis with acute lower respiratory infection: Secondary | ICD-10-CM

## 2015-07-08 DIAGNOSIS — R042 Hemoptysis: Secondary | ICD-10-CM

## 2015-07-08 DIAGNOSIS — J479 Bronchiectasis, uncomplicated: Secondary | ICD-10-CM | POA: Insufficient documentation

## 2015-07-08 LAB — CBC WITH DIFFERENTIAL/PLATELET
BASOS ABS: 0 10*3/uL (ref 0.0–0.1)
Basophils Relative: 0.4 % (ref 0.0–3.0)
Eosinophils Absolute: 0 10*3/uL (ref 0.0–0.7)
Eosinophils Relative: 0.6 % (ref 0.0–5.0)
HCT: 38.6 % (ref 36.0–46.0)
Hemoglobin: 13 g/dL (ref 12.0–15.0)
LYMPHS ABS: 2.6 10*3/uL (ref 0.7–4.0)
LYMPHS PCT: 39.3 % (ref 12.0–46.0)
MCHC: 33.7 g/dL (ref 30.0–36.0)
MCV: 92.8 fl (ref 78.0–100.0)
MONOS PCT: 7.8 % (ref 3.0–12.0)
Monocytes Absolute: 0.5 10*3/uL (ref 0.1–1.0)
NEUTROS PCT: 51.9 % (ref 43.0–77.0)
Neutro Abs: 3.4 10*3/uL (ref 1.4–7.7)
Platelets: 278 10*3/uL (ref 150.0–400.0)
RBC: 4.16 Mil/uL (ref 3.87–5.11)
RDW: 13.4 % (ref 11.5–15.5)
WBC: 6.6 10*3/uL (ref 4.0–10.5)

## 2015-07-08 LAB — RHEUMATOID FACTOR: Rhuematoid fact SerPl-aCnc: <10 IU/mL (ref <=14)

## 2015-07-08 LAB — NITRIC OXIDE: NITRIC OXIDE: 16

## 2015-07-08 MED ORDER — FLUTTER DEVI: Status: DC

## 2015-07-08 MED ORDER — CETIRIZINE HCL 10 MG PO TABS: 10.0000 mg | ORAL_TABLET | Freq: Every day | ORAL | Status: DC

## 2015-07-08 MED ORDER — FLUTICASONE PROPIONATE 50 MCG/ACT NA SUSP: 2.0000 | Freq: Every day | NASAL | Status: DC

## 2015-07-08 MED ORDER — LEVOFLOXACIN 750 MG PO TABS
750.0000 mg | ORAL_TABLET | Freq: Every day | ORAL | Status: DC
Start: 2015-07-08 — End: 2015-07-30

## 2015-07-08 MED ORDER — ALBUTEROL SULFATE HFA 108 (90 BASE) MCG/ACT IN AERS: 1.0000 | INHALATION_SPRAY | Freq: Four times a day (QID) | RESPIRATORY_TRACT | Status: DC | PRN

## 2015-07-08 NOTE — Progress Notes (Signed)
   Subjective:    Patient ID: Hannah Barron, female    DOB: Jul 24, 1970, 45 y.o.   MRN: [id number]  HPI    Review of Systems  Constitutional: Positive for chills ( chills and sweats at night). Negative for fever and unexpected weight change.  HENT: Negative for congestion, dental problem, ear pain, nosebleeds, postnasal drip, rhinorrhea, sinus pressure, sneezing, sore throat and trouble swallowing.   Eyes: Negative for redness and itching.  Respiratory: Positive for cough ( hemoptysis), chest tightness and shortness of breath. Negative for wheezing.   Cardiovascular: Positive for chest pain and palpitations. Negative for leg swelling.  Gastrointestinal: Negative for nausea and vomiting.  Genitourinary: Negative for dysuria.  Musculoskeletal: Negative for joint swelling.  Skin: Negative for rash.  Neurological: Positive for headaches.  Hematological: Does not bruise/bleed easily.  Psychiatric/Behavioral: Positive for agitation. Negative for dysphoric mood. The patient is nervous/anxious.        Objective:   Physical Exam        Assessment & Plan:

## 2015-07-08 NOTE — Assessment & Plan Note (Signed)
Bronchiectasis Flare  Plan: We will prescribe a rescue inhaler today. This is ProAir. Use as needed every 6 hours for shortness of breath or wheezing. Continue taking your Symbicort twice daily as you have been doing. We will add Zyrtec 10 mg daily for the post nasal drip you are having. Flonase 2 puffs in each nostril daily. FENO today. Flutter Valve : Use Twice Daily. We will instruct you on use. We will schedule you for a HRCT scan of your chest with inspiratory and expiratory images. We will schedule Pulmonary Function Tests We will start an antibiotic today. Levaquin 750 mg once daily x 7 days. Take a probiotic with this every day. ( Culturette or Align can be purchased at [employer] , [employer] conservation officer.) We will do allergy blood work today.  Additional labs today: CBC with diff, Quantitative Immunoglobulin IgE, Aspergillis antibody Sputum Culture for Fungus and AFB ANA ANCA RF Follow up appointment in 2 weeks with me. Please contact office for sooner follow up if symptoms do not improve or worsen or seek emergency care

## 2015-07-08 NOTE — Patient Instructions (Addendum)
It is nice to meet you today. We will prescribe a rescue inhaler today. This is ProAir. Use as needed every 6 hours for shortness of breath or wheezing. Continue taking your Symbicort twice daily as you have been doing. We will add Zyrtec 10 mg daily for the post nasal drip you are having. Flonase 2 puffs in each nostril daily. FENO today. Flutter Valve : Use Twice Daily. We will instruct you on use. We will schedule you for a HRCT scan of your chest with inspiratory and expiratory images. We will schedule Pulmonary Function Tests We will start an antibiotic today. Levaquin 750 mg once daily x 7 days. Take a probiotic with this every day. ( Culturette or Align can be purchased at [employer] , Nature conservation officer.) We will do allergy blood work today.  Additional labs today: CBC with diff, Quantitative Immunoglobulin IgE, Aspergillis antibody Sputum Culture for Fungus and AFB ANA ANCA RF We will request medical records from your lung doctor in [place] Follow up appointment in 2 weeks with Eric Form, NP. Please contact office for sooner follow up if symptoms do not improve or worsen or seek emergency care

## 2015-07-08 NOTE — Progress Notes (Signed)
History of Present Illness Hannah Barron is a 45 y.o. female never smoker with Bronchitis; Migraines; and COPD (chronic obstructive pulmonary disease) ([place]).    6/28/2017Consultation: Pt. Presents to the office today for consult for Bronchiectasis. She sees her PCP for control of her COPD. He had given her a BREO to try. 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 She states she is due for CXR and PFT's.She states she has been coughing up blood every morning for a week. It clears after the morning until the next day. She is also tearful and states that she has been anxious and abnormally angry with her husband. She has a lot of anxiety about her health and leaving her 62 year old daughter behind.She has not noted any weight loss but some weight gain.She has a cough with yellow sputum. She has chest tightness which is worse when she sits.She then feels short of breath.She is also noticing that she has an unusual amount of fatigue for about the last 2 months.She states she also has some right sided pain that is just below her right breast. She states it comes and goes.She states that at night she is having some night sweats.She sleeps with 5 pillows and on her left side.  Pulmonologist in [place]: Steward Ros, MD Pulmonary Medicine Critical Care  [phone] Phone [phone] Fax  Tests: CT Pelvis 05/09/2015 Level lower lobe bronchiectasis with bibasilar interlobular septal thickening.  Past medical hx Past Medical History  Diagnosis Date  . Bronchitis   . Migraines   . COPD (chronic obstructive pulmonary disease) (HCC)      Past surgical hx, Family hx, Social hx all reviewed.  Current Outpatient Prescriptions on File Prior to Visit    Medication Sig  . albuterol (PROVENTIL HFA;VENTOLIN HFA) 108 (90 Base) MCG/ACT inhaler Inhale 2 puffs into the lungs every 6 (six) hours as needed for wheezing or shortness of breath.  . budesonide-formoterol (SYMBICORT) 80-4.5 MCG/ACT inhaler Inhale 2 puffs into the lungs 2 (two) times daily.  Marland Kitchen dicyclomine (BENTYL) 20 MG tablet Take 20 mg by mouth every 6 (six) hours.  . fluticasone furoate-vilanterol (BREO ELLIPTA) 100-25 MCG/INH AEPB Inhale 1 puff into the lungs daily.  Marland Kitchen ibuprofen (ADVIL,MOTRIN) 600 MG tablet Take 1 tablet (600 mg total) by mouth every 6 (six) hours as needed for moderate pain.  Marland Kitchen omeprazole (PRILOSEC) 40 MG capsule Take 40 mg by mouth daily.   No current facility-administered medications on file prior to visit.     No Known Allergies   Family History  Problem Relation Age of Onset  . Healthy Mother   . Healthy Father     Social History   Social History Narrative   Fun: Watch TV    Review Of Systems:  Constitutional:   No  weight loss, +night sweats,  Fevers, chills, +fatigue, or  lassitude.  HEENT:   + headaches,  Difficulty swallowing,  Tooth/dental problems, or  Sore throat,                No sneezing, itching, ear ache, +nasal congestion,+ post nasal drip,   CV:  + chest pain right side underneath right breast,  + Orthopnea, PND, swelling in lower extremities, anasarca, dizziness, palpitations, syncope.  GI  No heartburn, indigestion, abdominal pain, nausea, vomiting, diarrhea, change in bowel habits, loss of appetite, bloody stools.   Resp: + shortness of breath with exertion not  at rest.  + excess mucus, + productive cough,  No non-productive cough,  + coughing up of blood.  + change in color of mucus.  No wheezing.  No chest wall deformity  Skin: no rash or lesions.  GU: no dysuria, change in color of urine, no urgency or frequency.  No flank pain, no hematuria   MS:  No joint pain or swelling.  No decreased range of motion.  No back  pain.  Psych:  + change in mood or affect. + depression or anxiety.  No memory loss.   Vital Signs BP 132/78 mmHg  Pulse 96  Ht 5\' 7"  (1.702 m)  Wt 171 lb (77.565 kg)  BMI 26.78 kg/m2  SpO2 99%  LMP 06/11/2015 (Exact Date)   Physical Exam:  General- No distress,  A&Ox3, anxious and tearful ENT: No sinus tenderness, TM clear, pale nasal mucosa, no oral exudate,+ post nasal drip, no LAN Cardiac: S1, S2, regular rate and rhythm, no murmur Chest: No wheeze/ Crackles per left mid lobes/no  dullness; no accessory muscle use, no nasal flaring, no sternal retractions Abd.: Soft Non-tender Ext: No clubbing cyanosis, edema Neuro:  normal strength Skin: No rashes, warm and dry Psych: Anxious and tearful    Assessment/Plan  Bronchiectasis without acute exacerbation (HCC) Bronchiectasis Flare  Plan: We will prescribe a rescue inhaler today. This is ProAir. Use as needed every 6 hours for shortness of breath or wheezing. Continue taking your Symbicort twice daily as you have been doing. We will add Zyrtec 10 mg daily for the post nasal drip you are having. Flonase 2 puffs in each nostril daily. FENO today. Flutter Valve : Use Twice Daily. We will instruct you on use. We will schedule you for a HRCT scan of your chest with inspiratory and expiratory images. We will schedule Pulmonary Function Tests We will start an antibiotic today. Levaquin 750 mg once daily x 7 days. Take a probiotic with this every day. ( Culturette or Align can be purchased at [employer] , Nature conservation officer.) We will do allergy blood work today.  Additional labs today: CBC with diff, Quantitative Immunoglobulin IgE, Aspergillis antibody Sputum Culture for Fungus and AFB ANA ANCA RF Follow up appointment in 2 weeks with me. Please contact office for sooner follow up if symptoms do not improve or worsen or seek emergency care     Magdalen Spatz, NP 07/08/2015  3:02 PM   Attending note: I have seen and  examined the patient with nurse practitioner/resident and agree with the note. History, labs and imaging reviewed.  45 Y/O with bronchiectasis, s/p VATs biopsy at [place] on 2003. Here to establish care. She appears to be in the middle of an exacerbation today. We will treat with a course of antibiotics, flutter valve for clearance of seceretions Start work up for bronchiectasis and try to obtain records from her pulmonologist  Marshell Garfinkel MD [facility] [place] Pager [phone] If no answer or after 3pm call: [phone] 07/11/2015, 4:12 AM

## 2015-07-09 LAB — IGE: IgE (Immunoglobulin E), Serum: 6 kU/L (ref <115)

## 2015-07-09 LAB — IGG, IGA, IGM
IGA: 134 mg/dL (ref 81–463)
IGM, SERUM: 168 mg/dL (ref 48–271)
IgG (Immunoglobin G), Serum: 1477 mg/dL (ref 694–1618)

## 2015-07-10 ENCOUNTER — Ambulatory Visit (INDEPENDENT_AMBULATORY_CARE_PROVIDER_SITE_OTHER): Payer: Medicare Other | Admitting: Pulmonary Disease

## 2015-07-10 ENCOUNTER — Other Ambulatory Visit: Payer: Medicare Other

## 2015-07-10 DIAGNOSIS — R042 Hemoptysis: Secondary | ICD-10-CM

## 2015-07-10 DIAGNOSIS — J470 Bronchiectasis with acute lower respiratory infection: Secondary | ICD-10-CM

## 2015-07-10 LAB — PULMONARY FUNCTION TEST
FEF 25-75 POST: 0.71 L/s
FEF 25-75 Pre: 0.9 L/sec
FEF2575-%Change-Post: -21 %
FEF2575-%PRED-PRE: 31 %
FEF2575-%Pred-Post: 24 %
FEV1-%Change-Post: -5 %
FEV1-%PRED-PRE: 60 %
FEV1-%Pred-Post: 56 %
FEV1-POST: 1.54 L
FEV1-Pre: 1.63 L
FEV1FVC-%Change-Post: 0 %
FEV1FVC-%PRED-PRE: 76 %
FEV6-%Change-Post: -4 %
FEV6-%PRED-POST: 75 %
FEV6-%Pred-Pre: 78 %
FEV6-POST: 2.45 L
FEV6-Pre: 2.57 L
FEV6FVC-%CHANGE-POST: 0 %
FEV6FVC-%PRED-POST: 102 %
FEV6FVC-%Pred-Pre: 101 %
FVC-%Change-Post: -5 %
FVC-%PRED-PRE: 77 %
FVC-%Pred-Post: 73 %
FVC-PRE: 2.59 L
FVC-Post: 2.45 L
POST FEV1/FVC RATIO: 63 %
PRE FEV1/FVC RATIO: 63 %
Post FEV6/FVC ratio: 100 %
Pre FEV6/FVC Ratio: 99 %

## 2015-07-10 LAB — ANA: Anti Nuclear Antibody(ANA): NEGATIVE

## 2015-07-10 LAB — ANCA SCREEN W REFLEX TITER: ANCA SCREEN: NEGATIVE

## 2015-07-10 NOTE — Progress Notes (Signed)
PFT performed today. DLCO could not be calculated due to the pt's inability to perform this test.

## 2015-07-11 LAB — RESPIRATORY CULTURE OR RESPIRATORY AND SPUTUM CULTURE

## 2015-07-15 LAB — ASPERGILLUS IGE PANEL
Aspergillus IgE Panel: <0.1 kU/L (ref <0.35)
Aspergillus Terreus IgE: <0.35 kU/L (ref <0.35)
Aspergillus Versicolor IgE: <0.1 kU/L (ref <0.35)
Aspergillus flavus IgE: <0.35 kU/L (ref <0.35)
Aspergillus nidulans IgE: <0.35 kU/L (ref <0.35)
CLASS: 0
CLASS: 0
CLASS: 0
CLASS: 0
Class: 0
Class: 0
Class: 0

## 2015-07-16 ENCOUNTER — Ambulatory Visit (INDEPENDENT_AMBULATORY_CARE_PROVIDER_SITE_OTHER)
Admission: RE | Admit: 2015-07-16 | Discharge: 2015-07-16 | Disposition: A | Discharge status: Home / Self Care | Payer: Medicare Other | Source: Ambulatory Visit | Attending: Pulmonary Disease | Admitting: Pulmonary Disease

## 2015-07-16 DIAGNOSIS — J470 Bronchiectasis with acute lower respiratory infection: Secondary | ICD-10-CM | POA: Diagnosis not present

## 2015-07-16 DIAGNOSIS — R042 Hemoptysis: Secondary | ICD-10-CM

## 2015-07-16 IMAGING — CT CT CHEST HIGH RESOLUTION W/O CM
2 of 6 series · 14 of 36 positions shown, 17 images · non-contrast
Comparison: No prior chest CT. CT the abdomen and pelvis
[DATE].

CLINICAL DATA: 45-year-old female with history of bronchiectasis.
Hemoptysis. Increasing shortness of breath with exertion. Cough.
Hoarseness for the past 2-3 months.

EXAM:
CT CHEST WITHOUT CONTRAST
TECHNIQUE: Multidetector CT imaging of the chest was performed following the
standard protocol without intravenous contrast. High resolution
imaging of the lungs, as well as inspiratory and expiratory imaging,
was performed.

[Series 4: high resolution · axial · 0.56mm/px · z∈[-323,-31]mm · 11 of 164 slices shown, 14 images]
[im 9/164  mediastinal]
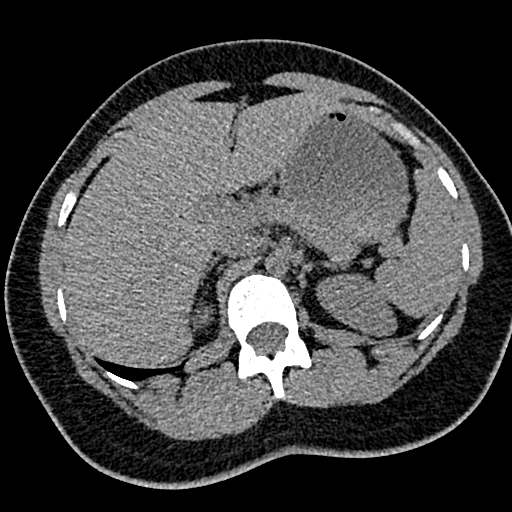
[im 9/164  lung]
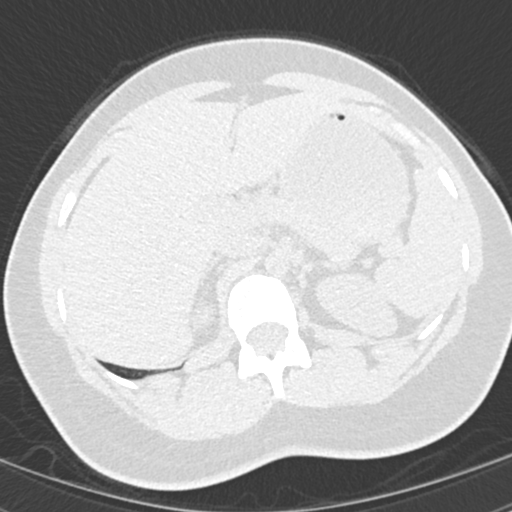
[im 26/164  lung]
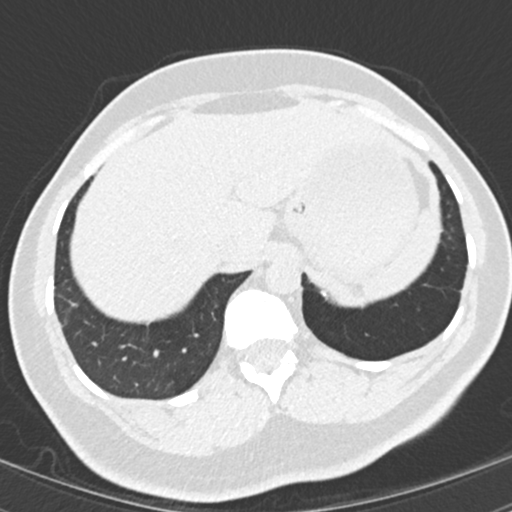
[im 43/164  lung]
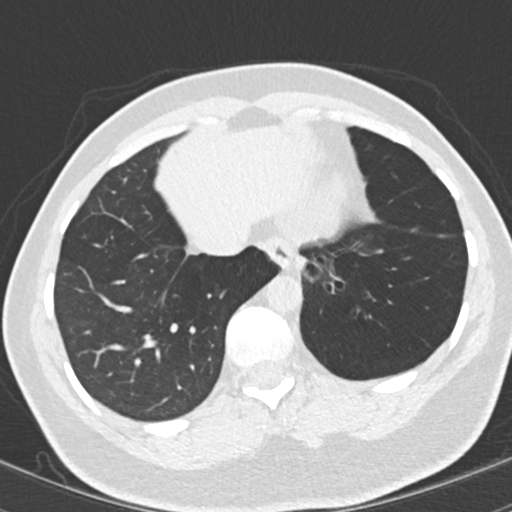
[im 52/164  lung]
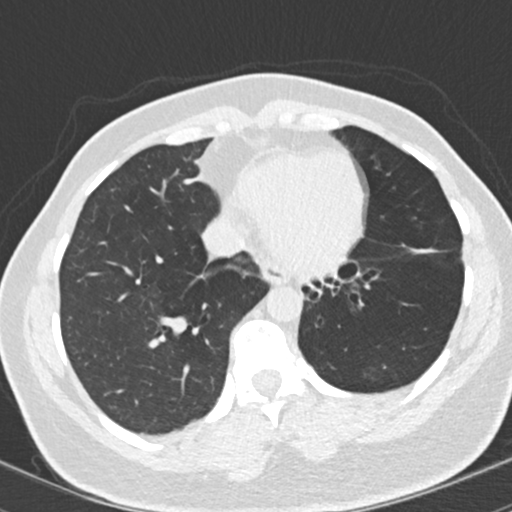
[im 69/164  mediastinal]
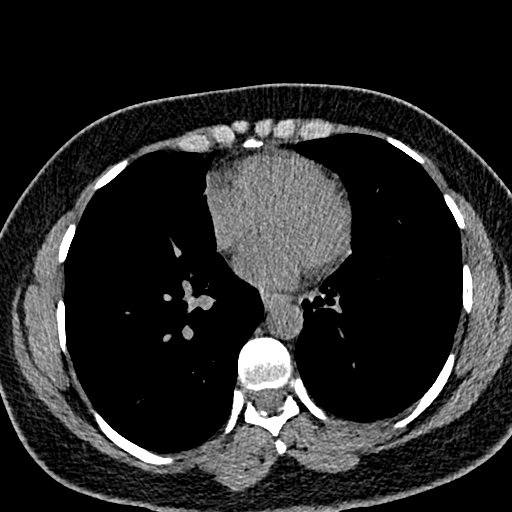
[im 69/164  lung]
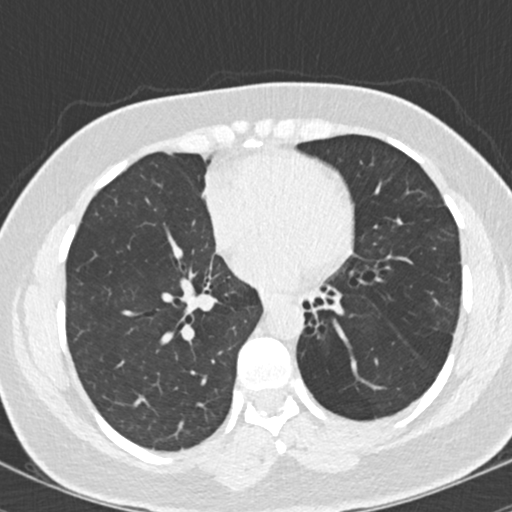
[im 86/164  lung]
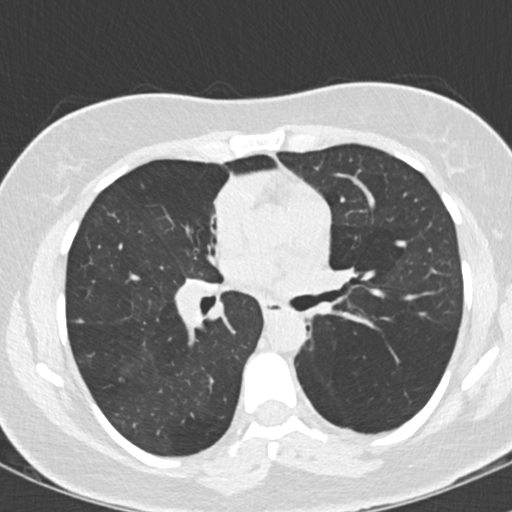
[im 95/164  lung]
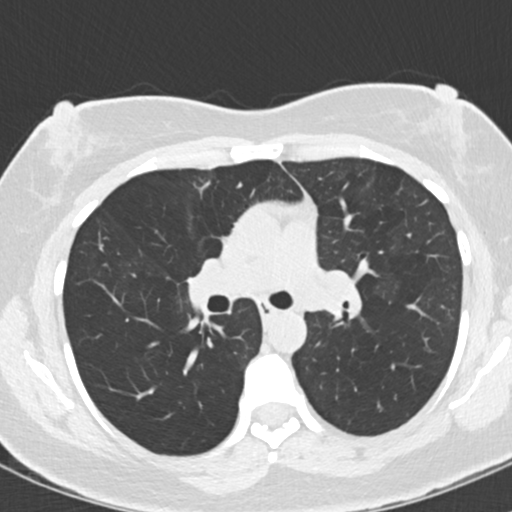
[im 112/164  lung]
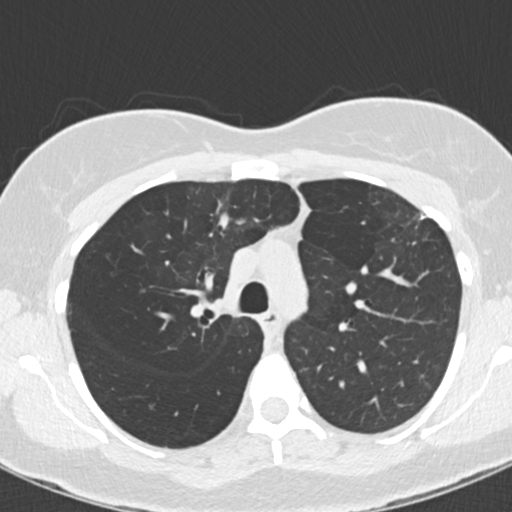
[im 121/164  mediastinal]
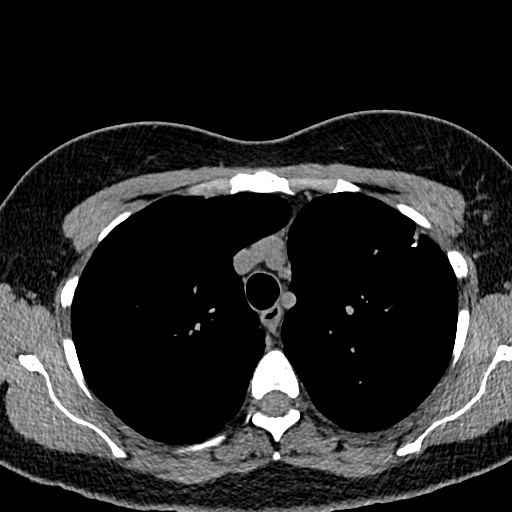
[im 121/164  lung]
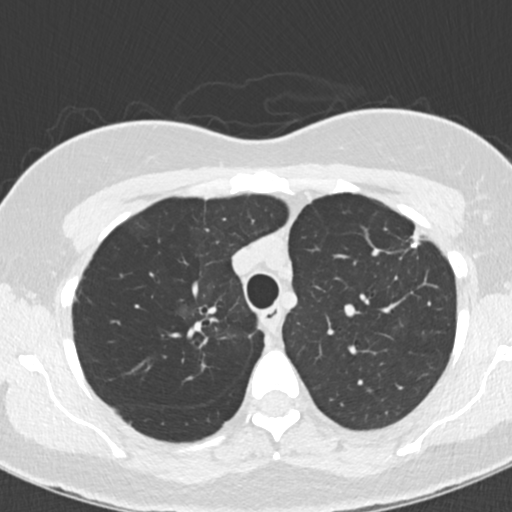
[im 138/164  lung]
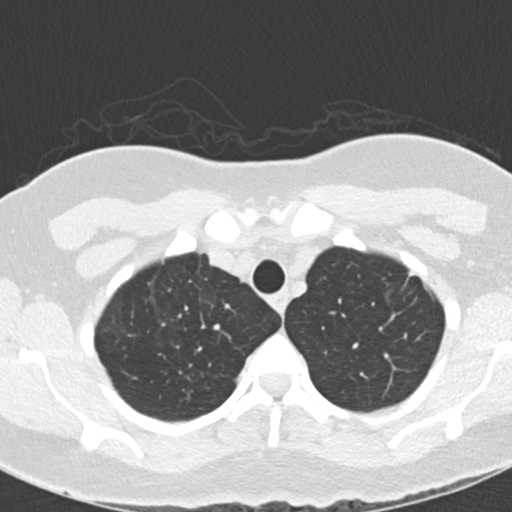
[im 155/164  lung]
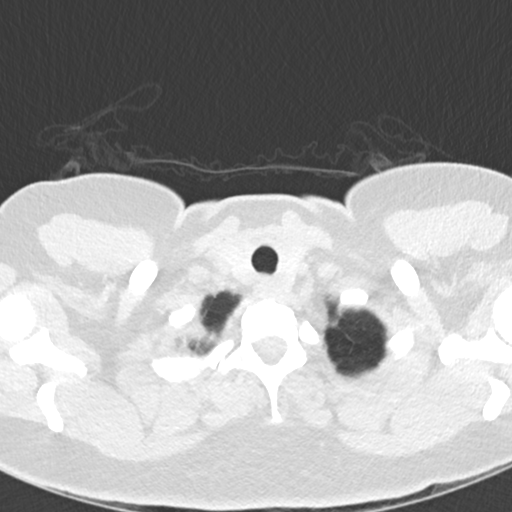

[Series 8: coronal · coronal · 0.59mm/px · 3 of 129 slices shown]
[im 26/129  lung]
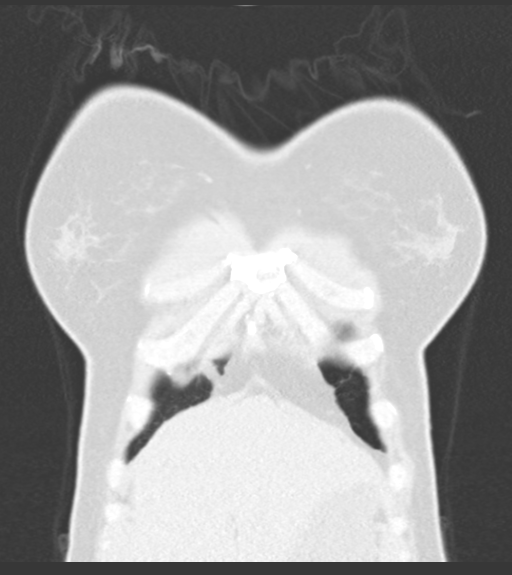
[im 52/129  lung]
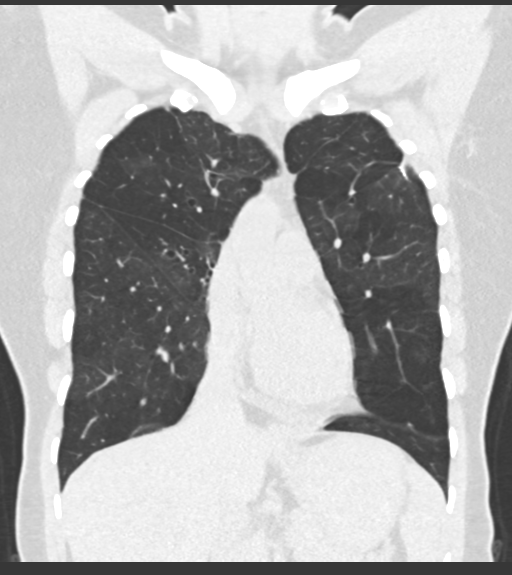
[im 77/129  lung]
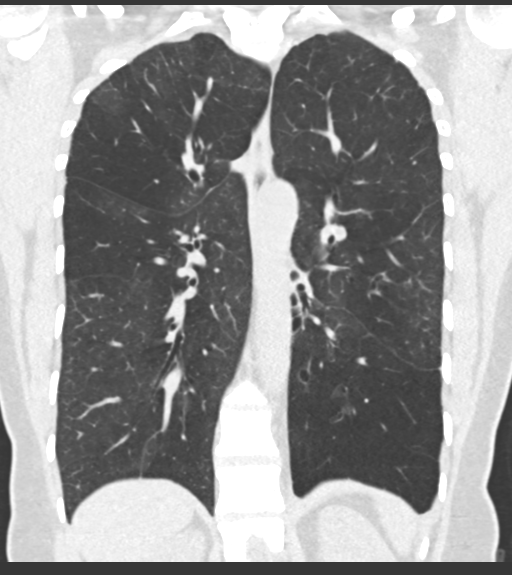

[14 of 36 positions shown; findings below may reference images not displayed]

FINDINGS: Mediastinum/Lymph Nodes: Heart size is normal. There is no
significant pericardial fluid, thickening or pericardial
calcification. No pathologically enlarged mediastinal or hilar lymph
nodes. Please note that accurate exclusion of hilar adenopathy is
limited on noncontrast CT scans. Esophagus is unremarkable in
appearance. No axillary lymphadenopathy.

Lungs/Pleura: High-resolution images demonstrate no areas of
significant ground-glass attenuation, septal thickening or
honeycombing. There are some areas of architectural distortion which
are favored to reflect post infectious or inflammatory scarring,
most evident in the apex of the right upper lobe where there is
pleuroparenchymal thickening. Some postoperative scarring is also
noted in the left upper lobe anteriorly at site of prior wedge
resection. Inspiratory and expiratory imaging demonstrates air
trapping from small airways disease. A few scattered tiny pulmonary
nodules are noted, most of which are 4 mm or less in size. The
largest pulmonary nodules include a 6 x 8 mm (mean diameter of 7 mm)
subpleural nodule in the right lower lobe (image 89 of series 5)
which is nonspecific and favored to represent a subpleural lymph
node, and a 6 x 8 mm (mean diameter of 7 mm) right upper lobe nodule
(image 53 of series 5). Several areas of bronchiectasis are noted,
most evident in the right middle lobe, inferior segment of the
lingula and in the basal segments of the left lower lobe where there
is both varicose and mild cystic bronchiectasis.

Upper abdomen: Unremarkable.

Musculoskeletal: There are no aggressive appearing lytic or blastic
lesions noted in the visualized portions of the skeleton.
IMPRESSION: 1. Areas of varicose and cystic bronchiectasis throughout the lungs
bilaterally, as above, most severe in the left lower lobe.
2. Extensive air trapping from small airways disease.
3. Multiple pulmonary nodules scattered throughout the lungs
bilaterally, measuring up to 7 mm in mean diameter. Non-contrast
chest CT at 3-6 months is recommended. If the nodules are stable at
time of repeat CT, then future CT at 18-24 months (from today's
scan) is considered optional for low-risk patients, but is
recommended for high-risk patients. This recommendation follows the
consensus statement: Guidelines for Management of Incidental
Pulmonary Nodules Detected on CT Images:From the [HOSPITAL]
[RM]; published online before print (10.1148/radiol.[PHONE_NUMBER]).
4. Additional incidental findings, as above.

## 2015-07-17 ENCOUNTER — Encounter: Payer: Self-pay | Admitting: Obstetrics & Gynecology

## 2015-07-17 ENCOUNTER — Ambulatory Visit (INDEPENDENT_AMBULATORY_CARE_PROVIDER_SITE_OTHER): Payer: Medicare Other | Admitting: Obstetrics & Gynecology

## 2015-07-17 VITALS — BP 118/64 | HR 93 | Ht 67.0 in | Wt 167.0 lb

## 2015-07-17 DIAGNOSIS — B3731 Acute candidiasis of vulva and vagina: Secondary | ICD-10-CM

## 2015-07-17 DIAGNOSIS — Z01419 Encounter for gynecological examination (general) (routine) without abnormal findings: Secondary | ICD-10-CM

## 2015-07-17 DIAGNOSIS — B373 Candidiasis of vulva and vagina: Secondary | ICD-10-CM | POA: Diagnosis not present

## 2015-07-17 DIAGNOSIS — R102 Pelvic and perineal pain: Secondary | ICD-10-CM

## 2015-07-17 MED ORDER — FLUCONAZOLE 150 MG PO TABS
150.0000 mg | ORAL_TABLET | Freq: Once | ORAL | Status: DC
Start: 2015-07-17 — End: 2015-07-30

## 2015-07-17 NOTE — Addendum Note (Signed)
Addended by: Langston Reusing on: 07/17/2015 12:13 PM   Modules accepted: Orders

## 2015-07-17 NOTE — Progress Notes (Signed)
Patient ID: Cali Kippen, female   DOB: [birth year], 45 y.o.   MRN: [id number] Subjective:     Brenlyn Rochford is a 45 y.o. female here for reeval of pelvic pain. Current complaints: pain improved since last visit.  Pt is interested in conceiving. She reports 2 months of itching vaginally.  Gynecologic History Patient's last menstrual period was 07/12/2015. Contraception: none Last Pap: 2016. Results were: normal Last mammogram: 12/2014. Results were: normal  Obstetric History OB History  Gravida Para Term Preterm AB SAB TAB Ectopic Multiple Living  1 1 1       1     # Outcome Date GA Lbr Len/2nd Weight Sex Delivery Anes PTL Lv  1 Term                The following portions of the patient's history were reviewed and updated as appropriate: allergies, current medications, past family history, past medical history, past social history, past surgical history and problem list.  Review of Systems Pertinent items are noted in HPI.    Objective:  BP 118/64 mmHg  Pulse 93  Ht 5\' 7"  (1.702 m)  Wt 167 lb (75.751 kg)  BMI 26.15 kg/m2  LMP 07/12/2015 GU: EGBUS: no lesions Vagina: no blood in vault; white discharge in small amount with some lumpiness   06/19/2015 CLINICAL DATA: Pelvic pain in female. Fibroids. LMP 06/11/2015.  EXAM: TRANSABDOMINAL AND TRANSVAGINAL ULTRASOUND OF PELVIS  TECHNIQUE: Both transabdominal and transvaginal ultrasound examinations of the pelvis were performed. Transabdominal technique was performed for global imaging of the pelvis including uterus, ovaries, adnexal regions, and pelvic cul-de-sac. It was necessary to proceed with endovaginal exam following the transabdominal exam to visualize the endometrial stripe and ovaries.  COMPARISON: CT on 05/09/2015  FINDINGS: Uterus  Measurements: 9.6 x 5.0 x 7.8 cm. Several intramural and subserosal uterine fibroids are seen. The largest fibroids are subserosal in location, within the uterine fundus  measuring 4.0 cm in the left lateral corpus measuring 3.8 cm.  Endometrium  Thickness: 6 mm. No focal abnormality visualized.  Right ovary  Measurements: 3.0 x 1.9 x 1.6 cm. Normal appearance/no adnexal mass.  Left ovary  Measurements: 3.5 x 1.1 x 1.7 cm. Normal appearance/no adnexal mass.  Other findings  No abnormal free fluid.  IMPRESSION: Several intramural and subserosal uterine fibroids, largest measuring 4 cm.  Normal appearance of both ovaries. No adnexal mass identified.   Assessment:  Pelvic pain- improved. Fibroids- asymptomatic Yeast vaginitis Infertility refer to Dr. Kerin Perna.  Due to pts age will not do initial tests here but, refer to him for counseling to avoid delays in care.   Plan:    Follow up in: 6 months.    Diflucan 150mg  po x 1 Refer to Dr. Kerin Perna Discussed conservative management of fibroids F/u KOH and wet mount Reviewed sono results from last visit.  20 min spent in face to face discussion with pt and exam    Derrisha Foos L. Harraway-Smith, M.D., Cherlynn June

## 2015-07-17 NOTE — Patient Instructions (Signed)
Uterine Fibroids Uterine fibroids are tissue masses (tumors) that can develop in the womb (uterus). They are also called leiomyomas. This type of tumor is not cancerous (benign) and does not spread to other parts of the body outside of the pelvic area, which is between the hip bones. Occasionally, fibroids may develop in the fallopian tubes, in the cervix, or on the support structures (ligaments) that surround the uterus. You can have one or many fibroids. Fibroids can vary in size, weight, and where they grow in the uterus. Some can become quite large. Most fibroids do not require medical treatment. CAUSES A fibroid can develop when a single uterine cell keeps growing (replicating). Most cells in the human body have a control mechanism that keeps them from replicating without control. SIGNS AND SYMPTOMS Symptoms may include:   Heavy bleeding during your period.  Bleeding or spotting between periods.  Pelvic pain and pressure.  Bladder problems, such as needing to urinate more often (urinary frequency) or urgently.  Inability to reproduce offspring (infertility).  Miscarriages. DIAGNOSIS Uterine fibroids are diagnosed through a physical exam. Your health care provider may feel the lumpy tumors during a pelvic exam. Ultrasonography and an MRI may be done to determine the size, location, and number of fibroids. TREATMENT Treatment may include:  Watchful waiting. This involves getting the fibroid checked by your health care provider to see if it grows or shrinks. Follow your health care provider's recommendations for how often to have this checked.  Hormone medicines. These can be taken by mouth or given through an intrauterine device (IUD).  Surgery.  Removing the fibroids (myomectomy) or the uterus (hysterectomy).  Removing blood supply to the fibroids (uterine artery embolization). If fibroids interfere with your fertility and you want to become pregnant, your health care provider  may recommend having the fibroids removed.  HOME CARE INSTRUCTIONS  Keep all follow-up visits as directed by your health care provider. This is important.  Take medicines only as directed by your health care provider.  If you were prescribed a hormone treatment, take the hormone medicines exactly as directed.  Do not take aspirin, because it can cause bleeding.  Ask your health care provider about taking iron pills and increasing the amount of dark green, leafy vegetables in your diet. These actions can help to boost your blood iron levels, which may be affected by heavy menstrual bleeding.  Pay close attention to your period and tell your health care provider about any changes, such as:  Increased blood flow that requires you to use more pads or tampons than usual per month.  A change in the number of days that your period lasts per month.  A change in symptoms that are associated with your period, such as abdominal cramping or back pain. SEEK MEDICAL CARE IF:  You have pelvic pain, back pain, or abdominal cramps that cannot be controlled with medicines.  You have an increase in bleeding between and during periods.  You soak tampons or pads in a half hour or less.  You feel lightheaded, extra tired, or weak. SEEK IMMEDIATE MEDICAL CARE IF:  You faint.  You have a sudden increase in pelvic pain.   This information is not intended to replace advice given to you by your health care provider. Make sure you discuss any questions you have with your health care provider.   Document Released: 12/25/1999 Document Revised: 01/17/2014 Document Reviewed: 06/25/2013 Elsevier Interactive Patient Education 2016 [employer]  

## 2015-07-18 LAB — WET PREP, GENITAL
CLUE CELLS WET PREP: NONE SEEN
Trich, Wet Prep: NONE SEEN

## 2015-07-20 ENCOUNTER — Telehealth: Payer: Self-pay

## 2015-07-20 NOTE — Telephone Encounter (Signed)
Please call pt. She did have a yeast infxn. It should be adequately treated with the diflucan that was prescribed.   I have left a message for patient to call us back regarding lab results.

## 2015-07-30 ENCOUNTER — Encounter: Payer: Self-pay | Admitting: Acute Care

## 2015-07-30 ENCOUNTER — Ambulatory Visit (INDEPENDENT_AMBULATORY_CARE_PROVIDER_SITE_OTHER): Payer: Medicare Other | Admitting: Acute Care

## 2015-07-30 VITALS — BP 122/68 | HR 100 | Ht 67.0 in | Wt 169.0 lb

## 2015-07-30 DIAGNOSIS — J42 Unspecified chronic bronchitis: Secondary | ICD-10-CM | POA: Diagnosis not present

## 2015-07-30 DIAGNOSIS — K219 Gastro-esophageal reflux disease without esophagitis: Secondary | ICD-10-CM

## 2015-07-30 DIAGNOSIS — J470 Bronchiectasis with acute lower respiratory infection: Secondary | ICD-10-CM | POA: Diagnosis not present

## 2015-07-30 MED ORDER — RANITIDINE HCL 150 MG PO TABS: 150.0000 mg | ORAL_TABLET | Freq: Two times a day (BID) | ORAL | Status: DC

## 2015-07-30 MED ORDER — ALBUTEROL SULFATE HFA 108 (90 BASE) MCG/ACT IN AERS: 2.0000 | INHALATION_SPRAY | Freq: Four times a day (QID) | RESPIRATORY_TRACT | Status: DC | PRN

## 2015-07-30 NOTE — Progress Notes (Signed)
History of Present Illness Hannah Barron is a 45 y.o. female never smoker with Bronchiectasis ( Biopsy diagnosis 2003); Migraines; GERD and COPD (chronic obstructive pulmonary disease) ([place]) followed by Dr. Vaughan Browner.   7/20/2017Follow Up Bronchiectasis: Pt. Returns to the clinic today for follow up of bronchiectasis  flare.She was treated with Levaquin x 7 days. In addition we added Zyrtec and Flonase daily for allergies and post nasal drip. She has completed her Levaquin and presents today feeling better. She denies fever, chest pain,  orthopnea or hemoptysis.She does still have a cough which is productive for yellow thick sputum, and some post nasal drip which is allergy related.She remains compliant with her Symbicort and Prilosec daily.She states that she has worsening cough at night, with some hoarseness. We will add zantac or Pepcid at bedtime in addition to the Prilosec 40 mg she takes daily .She is here today with her husband.She is less tearful today. She is requesting that we change her rescue inhaler to Ventolin, which we will do today.She will need follow up CT in 3 months to re-evaluate pulmonary nodules seen on CT imaging 07/16/2015. We did discuss her PFT results. We will compare them to the PFT's done by her pulmonologist in [place] once we receive them.( Requested at last visit)   Tests  PFT's 07/10/2015:  FVC:2.59/ 77% FEV1 1.63/ 60% F/F Ratio: 76% TLC: 78% DLCO: Pt. Was unable to complete. Moderate Airways Obstructive Disease Minimal Restrictive disease   HRCT:07/16/2015  IMPRESSION: 1. Areas of varicose and cystic bronchiectasis throughout the lungs bilaterally, as above, most severe in the left lower lobe. 2. Extensive air trapping from small airways disease. 3. Multiple pulmonary nodules scattered throughout the lungs bilaterally, measuring up to 7 mm in mean diameter. Non-contrast chest CT at 3-6 months is recommended. If the nodules are stable at time of repeat  CT, then future CT at 18-24 months (from today's scan) is considered optional for low-risk patients,  Allergy Blood Work>> 07/08/2015: Negative AFB/ Fungal Cultures>> Pending  Past medical hx Past Medical History  Diagnosis Date  . Bronchitis   . Migraines   . COPD (chronic obstructive pulmonary disease) (HCC)      Past surgical hx, Family hx, Social hx all reviewed.  Current Outpatient Prescriptions on File Prior to Visit  Medication Sig  . budesonide-formoterol (SYMBICORT) 80-4.5 MCG/ACT inhaler Inhale 2 puffs into the lungs 2 (two) times daily.  . cetirizine (ZYRTEC) 10 MG tablet Take 1 tablet (10 mg total) by mouth daily.  Marland Kitchen dicyclomine (BENTYL) 20 MG tablet Take 20 mg by mouth every 6 (six) hours.  . fluticasone (FLONASE) 50 MCG/ACT nasal spray Place 2 sprays into both nostrils daily.  Marland Kitchen ibuprofen (ADVIL,MOTRIN) 600 MG tablet Take 1 tablet (600 mg total) by mouth every 6 (six) hours as needed for moderate pain.  Marland Kitchen omeprazole (PRILOSEC) 40 MG capsule Take 40 mg by mouth daily.  Marland Kitchen Respiratory Therapy Supplies (FLUTTER) DEVI Use As Directed   No current facility-administered medications on file prior to visit.     No Known Allergies  Review Of Systems:  Constitutional:   No  weight loss, night sweats,  Fevers, chills, fatigue, or  lassitude.  HEENT:   No headaches,  Difficulty swallowing,  Tooth/dental problems, or  Sore throat,                No sneezing, itching, ear ache, nasal congestion, +post nasal drip,   CV:  No chest pain,  Orthopnea, PND, swelling in  lower extremities, anasarca, dizziness, palpitations, syncope.   GI  No heartburn, indigestion, abdominal pain, nausea, vomiting, diarrhea, change in bowel habits, loss of appetite, bloody stools.   Resp: + shortness of breath with exertion not  at rest.  + excess mucus, + productive cough,  No non-productive cough,  No coughing up of blood.  + change in color of mucus.  No wheezing.  No chest wall deformity  Skin:  no rash or lesions.  GU: no dysuria, change in color of urine, no urgency or frequency.  No flank pain, no hematuria   MS:  No joint pain or swelling.  No decreased range of motion.  No back pain.  Psych:  No change in mood or affect. No depression or anxiety.  No memory loss.   Vital Signs BP 122/68 mmHg  Pulse 100  Ht 5\' 7"  (1.702 m)  Wt 169 lb (76.658 kg)  BMI 26.46 kg/m2  SpO2 98%  LMP 07/12/2015   Physical Exam:  General- No distress,  A&Ox3, pleasant ENT: No sinus tenderness, TM clear, pale nasal mucosa, no oral exudate,+ post nasal drip, no LAN Cardiac: S1, S2, regular rate and rhythm, no murmur Chest: No wheeze/ rales/ dullness in left base; no accessory muscle use, no nasal flaring, no sternal retractions Abd.: Soft Non-tender Ext: No clubbing cyanosis, edema Neuro:  normal strength Skin: No rashes, warm and dry Psych: normal mood and behavior   Assessment/Plan  GERD (gastroesophageal reflux disease) Hoarseness of voice Belching at night Plan: Continue Protonix 40 mg daily Add Zantac 150mg  / or Pepcid 20 mg at bedtime GERD diet  COPD (chronic obstructive pulmonary disease) (HCC) Moderate COPD  Plan: Continue Symbicort Daily Proventil Rescue Inhaler   Bronchiectasis without acute exacerbation (HCC) We will add Zantac 150 mg/ or Pepcid 20 mg before bed. Continue your Symbicort daily as you have been doing. Resolved Flare: Plan: Continue using the Zyrtec for allergies once daily Continue Flonase 2 puffs up each nostril once daily. We will give you a copy of the GERD diet. We will change your rescue inhaler to Ventolin. Use up to every 6 hours for shortness of breath or wheezing. Follow up appointment in 3 months with Dr. Vaughan Browner or Judson Roch. We will repeat CT in 3 months to follow progression of pulmonary nodules. Please contact office for sooner follow up if symptoms do not improve or worsen or seek emergency care  We will add your old record to your  chart when we receive them from your Dr. In [facility]       Magdalen Spatz, NP 07/30/2015  11:19 PM

## 2015-07-30 NOTE — Assessment & Plan Note (Signed)
Moderate COPD  Plan: Continue Symbicort Daily Proventil Rescue Inhaler

## 2015-07-30 NOTE — Patient Instructions (Addendum)
It is good to see you today. We will add Zantac 150 mg/ or Pepcid 20 mg before bed. Continue your Symbicort daily as you have been doing. Continue using the Zyrtec for allergies once daily Continue Flonase 2 puffs up each nostril once daily. We will give you a copy of the GERD diet. We will change your rescue inhaler to Ventolin. Use up to every 6 hours for shortness of breath or wheezing. Follow up appointment in 3 months with Dr. Vaughan Browner or Judson Roch. We will repeat CT in 3 months to follow progression of pulmonary nodules. Please contact office for sooner follow up if symptoms do not improve or worsen or seek emergency care  We will add your old record to your chart when we receive them from your Dr. In [place]

## 2015-07-30 NOTE — Assessment & Plan Note (Signed)
We will add Zantac 150 mg/ or Pepcid 20 mg before bed. Continue your Symbicort daily as you have been doing. Resolved Flare: Plan: Continue using the Zyrtec for allergies once daily Continue Flonase 2 puffs up each nostril once daily. We will give you a copy of the GERD diet. We will change your rescue inhaler to Ventolin. Use up to every 6 hours for shortness of breath or wheezing. Follow up appointment in 3 months with Dr. Vaughan Browner or Judson Roch. We will repeat CT in 3 months to follow progression of pulmonary nodules. Please contact office for sooner follow up if symptoms do not improve or worsen or seek emergency care  We will add your old record to your chart when we receive them from your Dr. In [place]

## 2015-07-30 NOTE — Assessment & Plan Note (Signed)
Hoarseness of voice Belching at night Plan: Continue Protonix 40 mg daily Add Zantac 150mg  / or Pepcid 20 mg at bedtime GERD diet

## 2015-07-31 NOTE — Telephone Encounter (Signed)
Pt was notified that she had +yeast infection @ last visit and that the prescribed medication should have treated the problem.  Pt stated that she took the medication and had good results. She expressed gratitude and had no questions.

## 2015-08-28 LAB — FUNGUS CULTURE W SMEAR

## 2015-08-28 LAB — AFB CULTURE WITH SMEAR (NOT AT ARMC): Source:: 0

## 2015-09-16 ENCOUNTER — Other Ambulatory Visit: Payer: Self-pay | Admitting: Pulmonary Disease

## 2015-10-28 ENCOUNTER — Encounter: Payer: Self-pay | Admitting: Acute Care

## 2015-10-28 ENCOUNTER — Ambulatory Visit (INDEPENDENT_AMBULATORY_CARE_PROVIDER_SITE_OTHER): Payer: Medicare Other | Admitting: Acute Care

## 2015-10-28 ENCOUNTER — Ambulatory Visit (INDEPENDENT_AMBULATORY_CARE_PROVIDER_SITE_OTHER)
Admission: RE | Admit: 2015-10-28 | Discharge: 2015-10-28 | Disposition: A | Discharge status: Home / Self Care | Payer: Medicare Other | Source: Ambulatory Visit | Attending: Acute Care | Admitting: Acute Care

## 2015-10-28 VITALS — BP 106/62 | HR 95 | Ht 68.0 in | Wt 171.6 lb

## 2015-10-28 DIAGNOSIS — J471 Bronchiectasis with (acute) exacerbation: Secondary | ICD-10-CM | POA: Diagnosis not present

## 2015-10-28 DIAGNOSIS — K219 Gastro-esophageal reflux disease without esophagitis: Secondary | ICD-10-CM

## 2015-10-28 IMAGING — DX DG CHEST 2V
2 series · 2 of 2 positions shown · non-contrast
Comparison: Chest CTA [DATE]

CLINICAL DATA: Shortness of breath with cough and congestion for 2
weeks. History of bronchiectasis.

EXAM:
CHEST  2 VIEW

[chest pa]
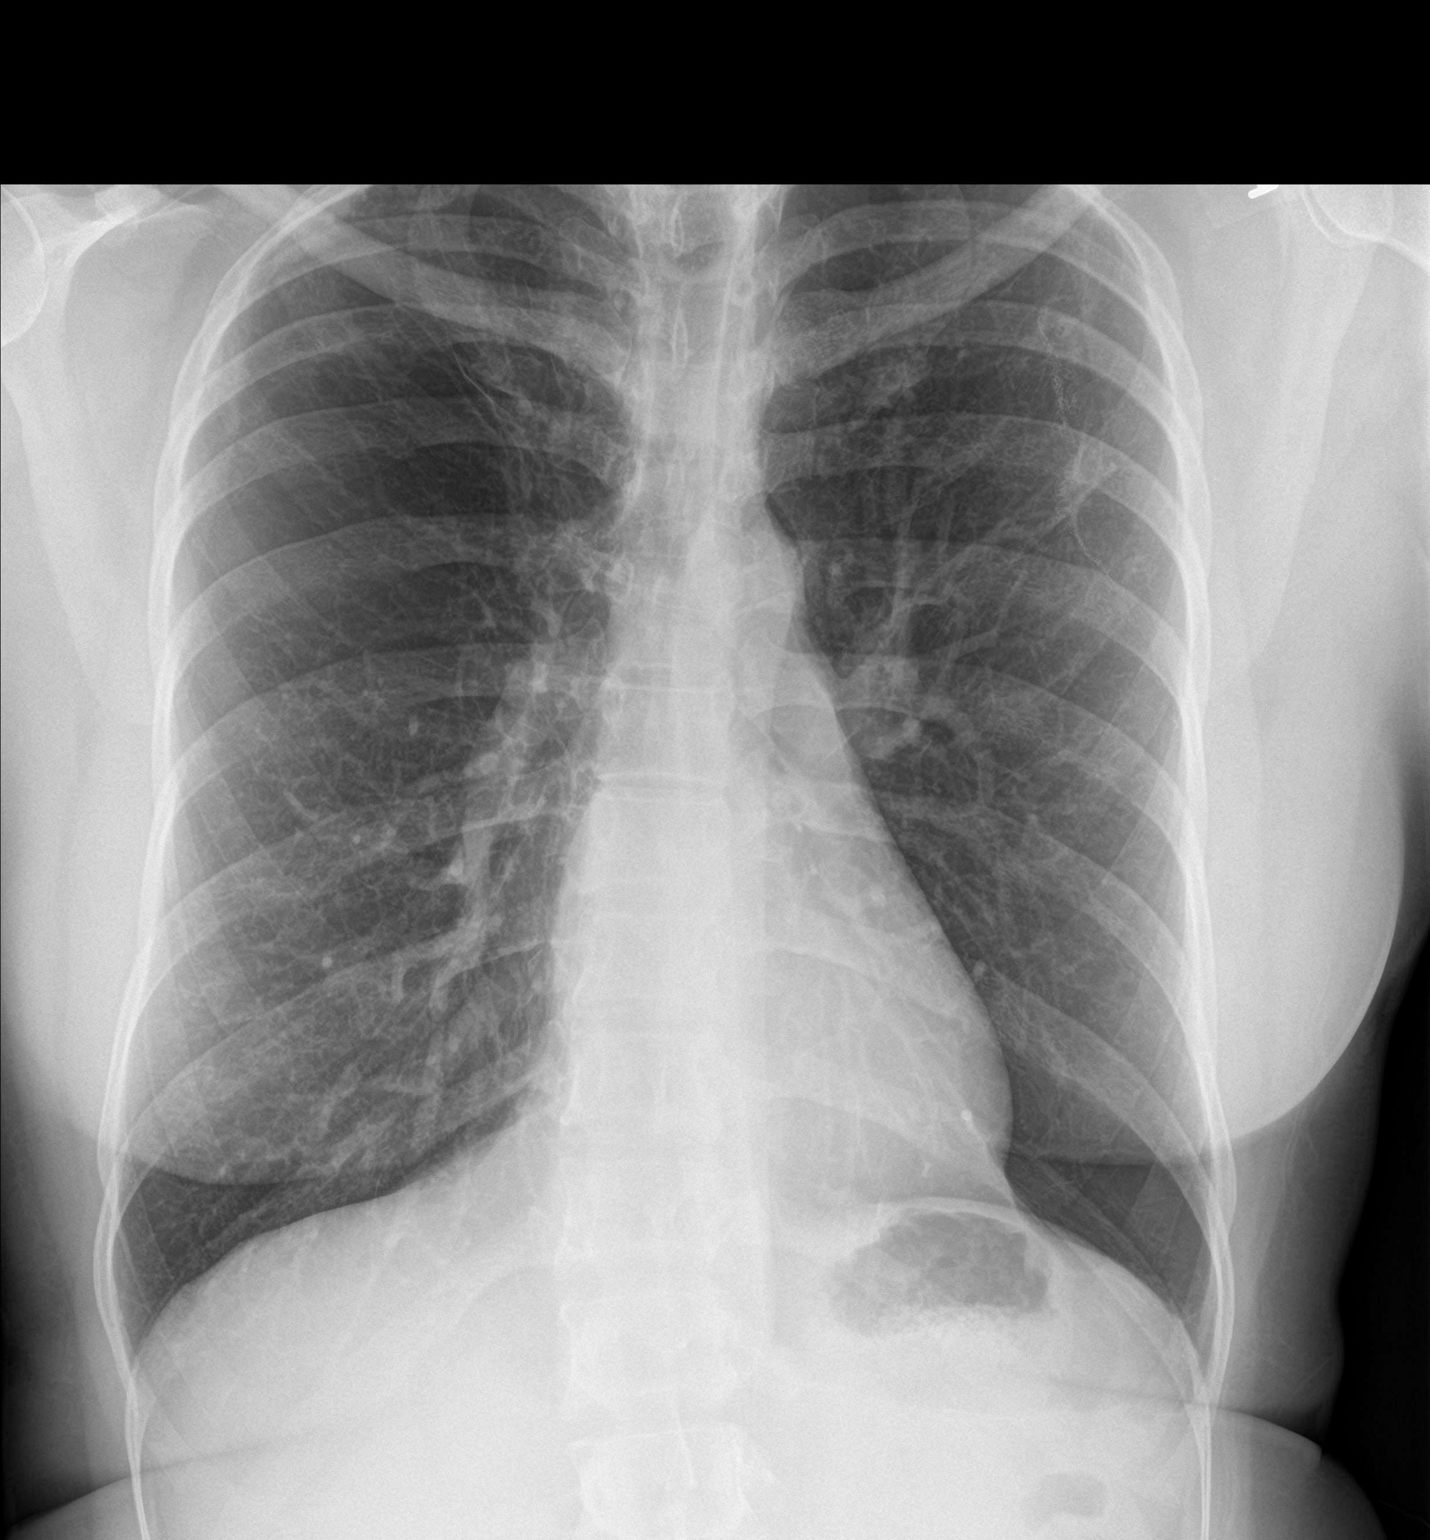

[chest lat]
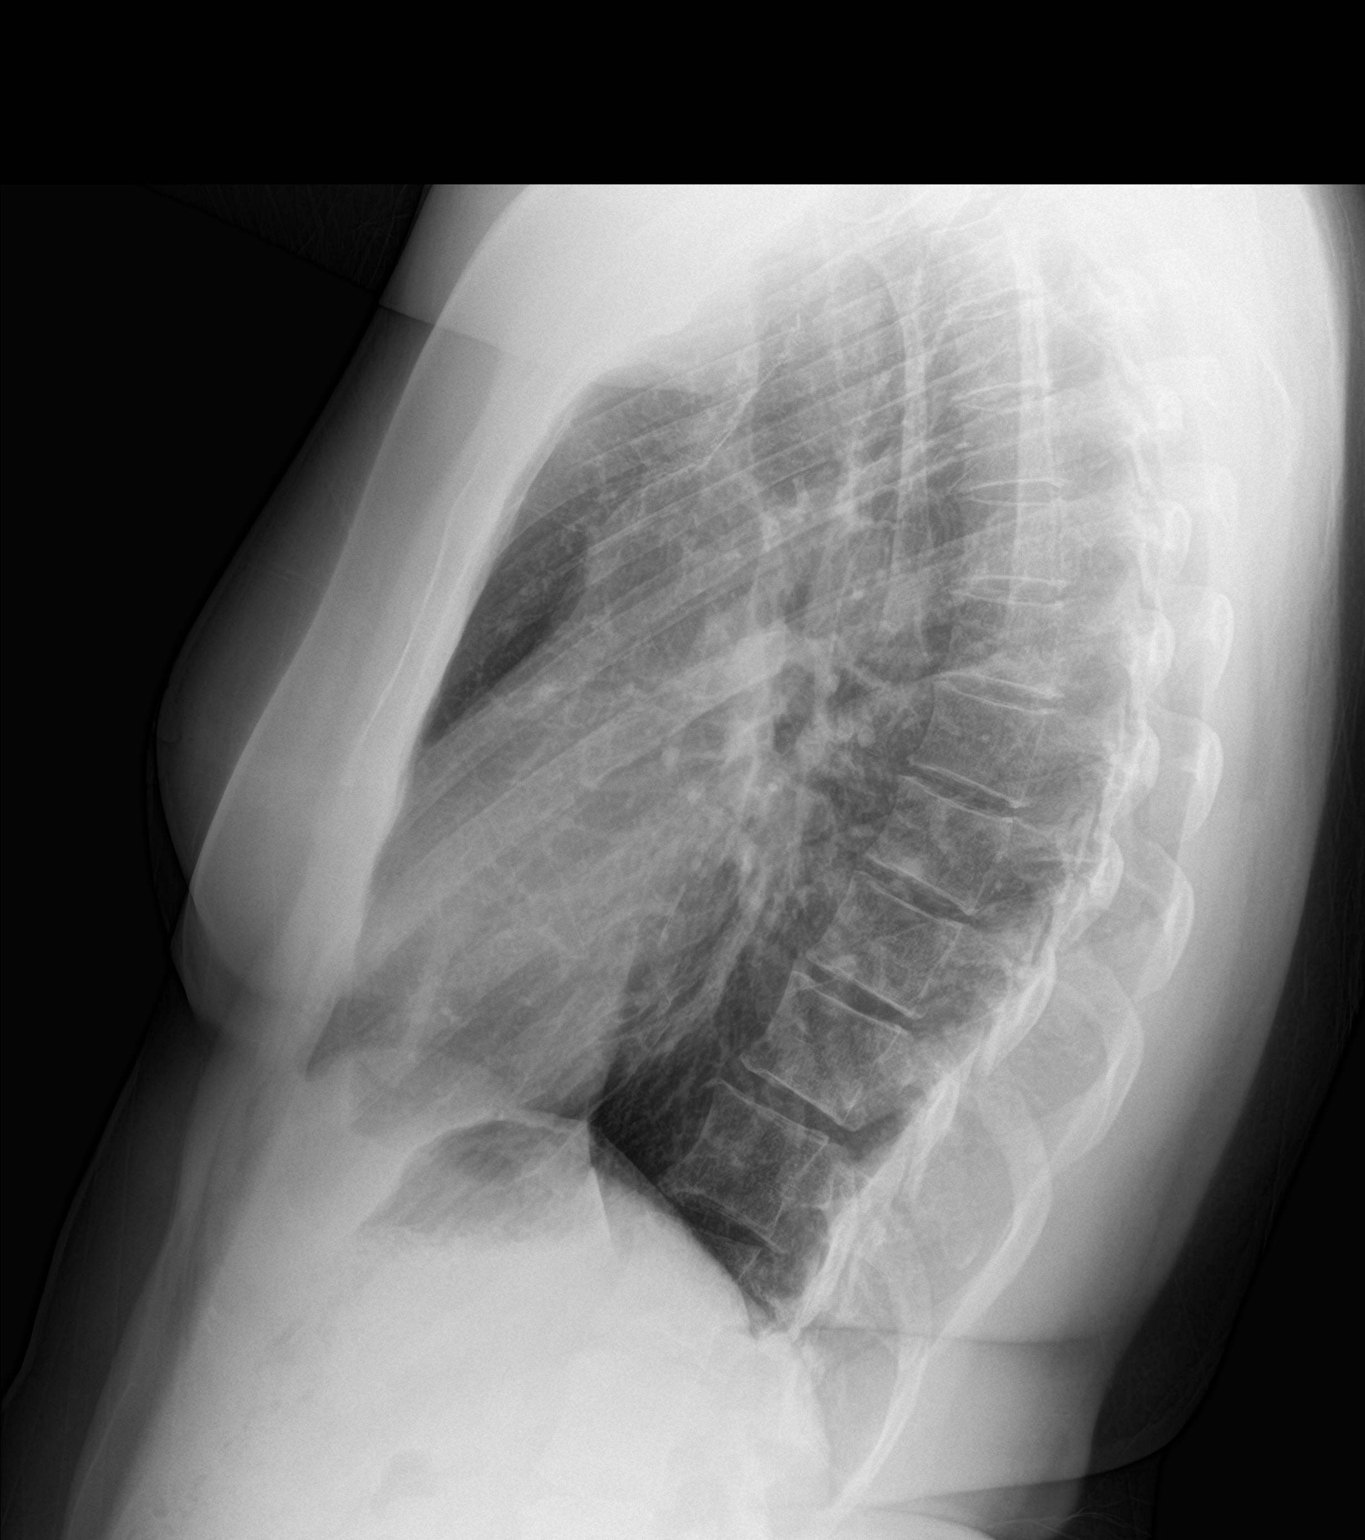

[2 of 2 positions shown; findings below may reference images not displayed]

FINDINGS: Scarring at the right lung apex. Surgical changes in the left upper
lung. Lungs are clear without airspace disease or pulmonary edema.
No pleural effusions. Heart and mediastinum are within normal
limits. Trachea is midline. No acute bone abnormality.
IMPRESSION: No active cardiopulmonary disease.

## 2015-10-28 MED ORDER — PREDNISONE 20 MG PO TABS: ORAL_TABLET | ORAL | 0 refills | Status: DC

## 2015-10-28 MED ORDER — LEVOFLOXACIN 750 MG PO TABS: 750.0000 mg | ORAL_TABLET | Freq: Every day | ORAL | 0 refills | Status: DC

## 2015-10-28 NOTE — Patient Instructions (Signed)
It is good to see you again today. CXR today We will call you with results. We will send in a prescription for Prednisone taper; 10 mg tablets: 4 tabs x 2 days, 3 tabs x 2 days, 2 tabs x 2 days 1 tab x 2 days then stop. Levaquin 750 mg. One daily x 7 days Continue using your flutter valve twice daily. Mucinex 600 mg twice daily for chest congestion. Continue your Zyrtec and Flonase Continue your Symbicort 2 puffs twice daily. Remember to rinse your mouth after use. Use your rescue inhaler as needed up to every 6 hours for shortness of breath or wheezing. Add Zantac 150 mg every night 1 hour before bed.  Follow up appointment in 2 weeks with me. Please contact office for sooner follow up if symptoms do not improve or worsen or seek emergency care

## 2015-10-28 NOTE — Assessment & Plan Note (Signed)
Continue Protonix daily Add Zantac at bedtime.

## 2015-10-28 NOTE — Assessment & Plan Note (Addendum)
Mild Flare of Bronchiectasis Plan: CXR today We will call you with results. We will send in a prescription for Prednisone taper; 10 mg tablets: 4 tabs x 2 days, 3 tabs x 2 days, 2 tabs x 2 days 1 tab x 2 days then stop. Levaquin 750 mg. One daily x 7 days Continue using your flutter valve twice daily. Mucinex 600 mg twice daily for chest congestion. Continue your Zyrtec and Flonase Continue your Symbicort 2 puffs twice daily. Remember to rinse your mouth after use. Use your rescue inhaler as needed up to every 6 hours for shortness of breath or wheezing. Add Zantac 150 mg every night 1 hour before bed.  Will need flu shot at follow up appointment in 2 weeks Follow up appointment in 2 weeks with me. Please contact office for sooner follow up if symptoms do not improve or worsen or seek emergency care

## 2015-10-28 NOTE — Progress Notes (Signed)
History of Present Illness Hannah Barron is a 45 y.o. female  Never smoker with Bronchitis; Migraines; and COPD. She is followed by Dr. Vaughan Browner.   10/28/2015 Acute OV: Pt. Presents to the office today with a 2 week history of worsening shortness of breath with cough that is productive for yellow sputum. She especially notices that climbing stairs is very difficult.She states that she does have night sweats, so she thinks she is having a fever. She states that this worse at night.She states she does feel some reflux. She has very little energy. She states that she has a 5 pillow orthopnea. She does appear anxious today, but vital signs are stable and she is afebrile.   Tests 10/28/2015>>Pending  Past medical hx Past Medical History:  Diagnosis Date  . Bronchitis   . COPD (chronic obstructive pulmonary disease) ([place])   . Migraines      Past surgical hx, Family hx, Social hx all reviewed.  Current Outpatient Prescriptions on File Prior to Visit  Medication Sig  . albuterol (PROVENTIL HFA;VENTOLIN HFA) 108 (90 Base) MCG/ACT inhaler Inhale 2 puffs into the lungs every 6 (six) hours as needed for wheezing or shortness of breath.  . budesonide-formoterol (SYMBICORT) 80-4.5 MCG/ACT inhaler Inhale 2 puffs into the lungs 2 (two) times daily.  . cetirizine (ZYRTEC) 10 MG tablet TAKE 1 TABLET (10 MG TOTAL) BY MOUTH DAILY.  Marland Kitchen dicyclomine (BENTYL) 20 MG tablet Take 20 mg by mouth every 6 (six) hours.  . fluticasone (FLONASE) 50 MCG/ACT nasal spray Place 2 sprays into both nostrils daily.  Marland Kitchen ibuprofen (ADVIL,MOTRIN) 600 MG tablet Take 1 tablet (600 mg total) by mouth every 6 (six) hours as needed for moderate pain.  . naproxen (NAPROSYN) 500 MG tablet Take 500 mg by mouth 2 (two) times daily.  Marland Kitchen omeprazole (PRILOSEC) 40 MG capsule Take 40 mg by mouth daily.  . ranitidine (ZANTAC) 150 MG tablet Take 1 tablet (150 mg total) by mouth 2 (two) times daily.  Marland Kitchen Respiratory Therapy Supplies (FLUTTER) DEVI  Use As Directed  . RESTASIS 0.05 % ophthalmic emulsion INSTILL 1 DROP IN EACH EYE TWICE DAILY.   No current facility-administered medications on file prior to visit.      No Known Allergies  Review Of Systems:  Constitutional:   No  weight loss, night sweats,  Fevers, chills, + fatigue, or  lassitude.  HEENT:   No headaches,  Difficulty swallowing,  Tooth/dental problems, or  Sore throat,                No sneezing, itching, ear ache, +nasal congestion, post nasal drip,   CV:  No chest pain,  Orthopnea, PND, swelling in lower extremities, anasarca, dizziness, palpitations, syncope.   GI  No heartburn, +indigestion, abdominal pain, nausea, vomiting, diarrhea, change in bowel habits, loss of appetite, bloody stools.   Resp: + shortness of breath with exertion less at rest.  + excess mucus, + productive cough,  No non-productive cough,  No coughing up of blood.  + change in color of mucus.  + wheezing.  No chest wall deformity  Skin: no rash or lesions.  GU: no dysuria, change in color of urine, no urgency or frequency.  No flank pain, no hematuria   MS:  No joint pain or swelling.  No decreased range of motion.  No back pain.  Psych:  No change in mood or affect. No depression or anxiety.  No memory loss.   Vital Signs BP 106/62 (BP  Location: Left Arm, Cuff Size: Normal)   Pulse 95   Ht 5\' 8"  (1.727 m)   Wt 171 lb 9.6 oz (77.8 kg)   SpO2 100%   BMI 26.09 kg/m    Physical Exam:  General- No distress,  A&Ox3, pleasant and anxious ENT: No sinus tenderness, TM clear, pale nasal mucosa, no oral exudate,+ post nasal drip, no LAN Cardiac: S1, S2, regular rate and rhythm, no murmur Chest: + wheeze/ no rales/ dullness; no accessory muscle use, no nasal flaring, no sternal retractions Abd.: Soft Non-tender Ext: No clubbing cyanosis, edema Neuro:  normal strength Skin: No rashes, warm and dry Psych: normal mood and behavior   Assessment/Plan  GERD (gastroesophageal reflux  disease) Continue Protonix daily Add Zantac at bedtime.  Bronchiectasis without acute exacerbation (HCC) Mild Flare of Bronchiectasis Plan: CXR today We will call you with results. We will send in a prescription for Prednisone taper; 10 mg tablets: 4 tabs x 2 days, 3 tabs x 2 days, 2 tabs x 2 days 1 tab x 2 days then stop. Levaquin 750 mg. One daily x 7 days Continue using your flutter valve twice daily. Mucinex 600 mg twice daily for chest congestion. Continue your Zyrtec and Flonase Continue your Symbicort 2 puffs twice daily. Remember to rinse your mouth after use. Use your rescue inhaler as needed up to every 6 hours for shortness of breath or wheezing. Add Zantac 150 mg every night 1 hour before bed.  Will need flu shot at follow up appointment in 2 weeks Follow up appointment in 2 weeks with me. Please contact office for sooner follow up if symptoms do not improve or worsen or seek emergency care       Magdalen Spatz, NP 10/28/2015  4:06 PM

## 2015-10-29 NOTE — Progress Notes (Signed)
lmtcb for pt.  

## 2015-11-02 ENCOUNTER — Ambulatory Visit: Payer: Medicare Other | Admitting: Acute Care

## 2015-11-05 ENCOUNTER — Emergency Department (HOSPITAL_COMMUNITY): Payer: Medicare Other

## 2015-11-05 ENCOUNTER — Observation Stay (HOSPITAL_COMMUNITY)
Admission: EM | Admit: 2015-11-05 | Discharge: 2015-11-07 | Disposition: A | Discharge status: Home / Self Care | Payer: Medicare Other | Attending: Internal Medicine | Admitting: Internal Medicine

## 2015-11-05 ENCOUNTER — Encounter (HOSPITAL_COMMUNITY): Payer: Self-pay

## 2015-11-05 DIAGNOSIS — K219 Gastro-esophageal reflux disease without esophagitis: Secondary | ICD-10-CM | POA: Diagnosis present

## 2015-11-05 DIAGNOSIS — J449 Chronic obstructive pulmonary disease, unspecified: Secondary | ICD-10-CM | POA: Diagnosis not present

## 2015-11-05 DIAGNOSIS — Z23 Encounter for immunization: Secondary | ICD-10-CM | POA: Insufficient documentation

## 2015-11-05 DIAGNOSIS — R002 Palpitations: Secondary | ICD-10-CM

## 2015-11-05 DIAGNOSIS — R079 Chest pain, unspecified: Secondary | ICD-10-CM | POA: Diagnosis present

## 2015-11-05 DIAGNOSIS — R0602 Shortness of breath: Secondary | ICD-10-CM | POA: Insufficient documentation

## 2015-11-05 DIAGNOSIS — R0789 Other chest pain: Secondary | ICD-10-CM | POA: Diagnosis not present

## 2015-11-05 DIAGNOSIS — I471 Supraventricular tachycardia: Secondary | ICD-10-CM | POA: Insufficient documentation

## 2015-11-05 DIAGNOSIS — G43909 Migraine, unspecified, not intractable, without status migrainosus: Secondary | ICD-10-CM | POA: Insufficient documentation

## 2015-11-05 DIAGNOSIS — J42 Unspecified chronic bronchitis: Secondary | ICD-10-CM

## 2015-11-05 LAB — I-STAT TROPONIN, ED: TROPONIN I, POC: 0 ng/mL (ref 0.00–0.08)

## 2015-11-05 LAB — BASIC METABOLIC PANEL
Anion gap: 9 (ref 5–15)
BUN: 13 mg/dL (ref 6–20)
CHLORIDE: 103 mmol/L (ref 101–111)
CO2: 23 mmol/L (ref 22–32)
CREATININE: 0.83 mg/dL (ref 0.44–1.00)
Calcium: 9.5 mg/dL (ref 8.9–10.3)
GFR calc Af Amer: >60 mL/min (ref >60)
GFR calc non Af Amer: >60 mL/min (ref >60)
Glucose, Bld: 106 mg/dL — ABNORMAL HIGH (ref 65–99)
Potassium: 4.1 mmol/L (ref 3.5–5.1)
SODIUM: 135 mmol/L (ref 135–145)

## 2015-11-05 LAB — CBC WITH DIFFERENTIAL/PLATELET
Basophils Absolute: 0 10*3/uL (ref 0.0–0.1)
Basophils Relative: 0 %
EOS ABS: 0 10*3/uL (ref 0.0–0.7)
EOS PCT: 0 %
HCT: 40.3 % (ref 36.0–46.0)
Hemoglobin: 13.8 g/dL (ref 12.0–15.0)
LYMPHS ABS: 1.9 10*3/uL (ref 0.7–4.0)
Lymphocytes Relative: 17 %
MCH: 31.5 pg (ref 26.0–34.0)
MCHC: 34.2 g/dL (ref 30.0–36.0)
MCV: 92 fL (ref 78.0–100.0)
Monocytes Absolute: 0.6 10*3/uL (ref 0.1–1.0)
Monocytes Relative: 5 %
Neutro Abs: 8.8 10*3/uL — ABNORMAL HIGH (ref 1.7–7.7)
Neutrophils Relative %: 78 %
PLATELETS: 356 10*3/uL (ref 150–400)
RBC: 4.38 MIL/uL (ref 3.87–5.11)
RDW: 12.4 % (ref 11.5–15.5)
WBC: 11.3 10*3/uL — AB (ref 4.0–10.5)

## 2015-11-05 LAB — D-DIMER, QUANTITATIVE (NOT AT ARMC)

## 2015-11-05 LAB — TROPONIN I: Troponin I: 0.04 ng/mL (ref <0.03)

## 2015-11-05 IMAGING — CR DG CHEST 2V
2 series · 2 of 2 positions shown · non-contrast
Comparison: Chest radiograph [DATE] and CT chest [DATE]

CLINICAL DATA: Shortness of breath and palpitations

EXAM:
CHEST  2 VIEW

[chest pa]
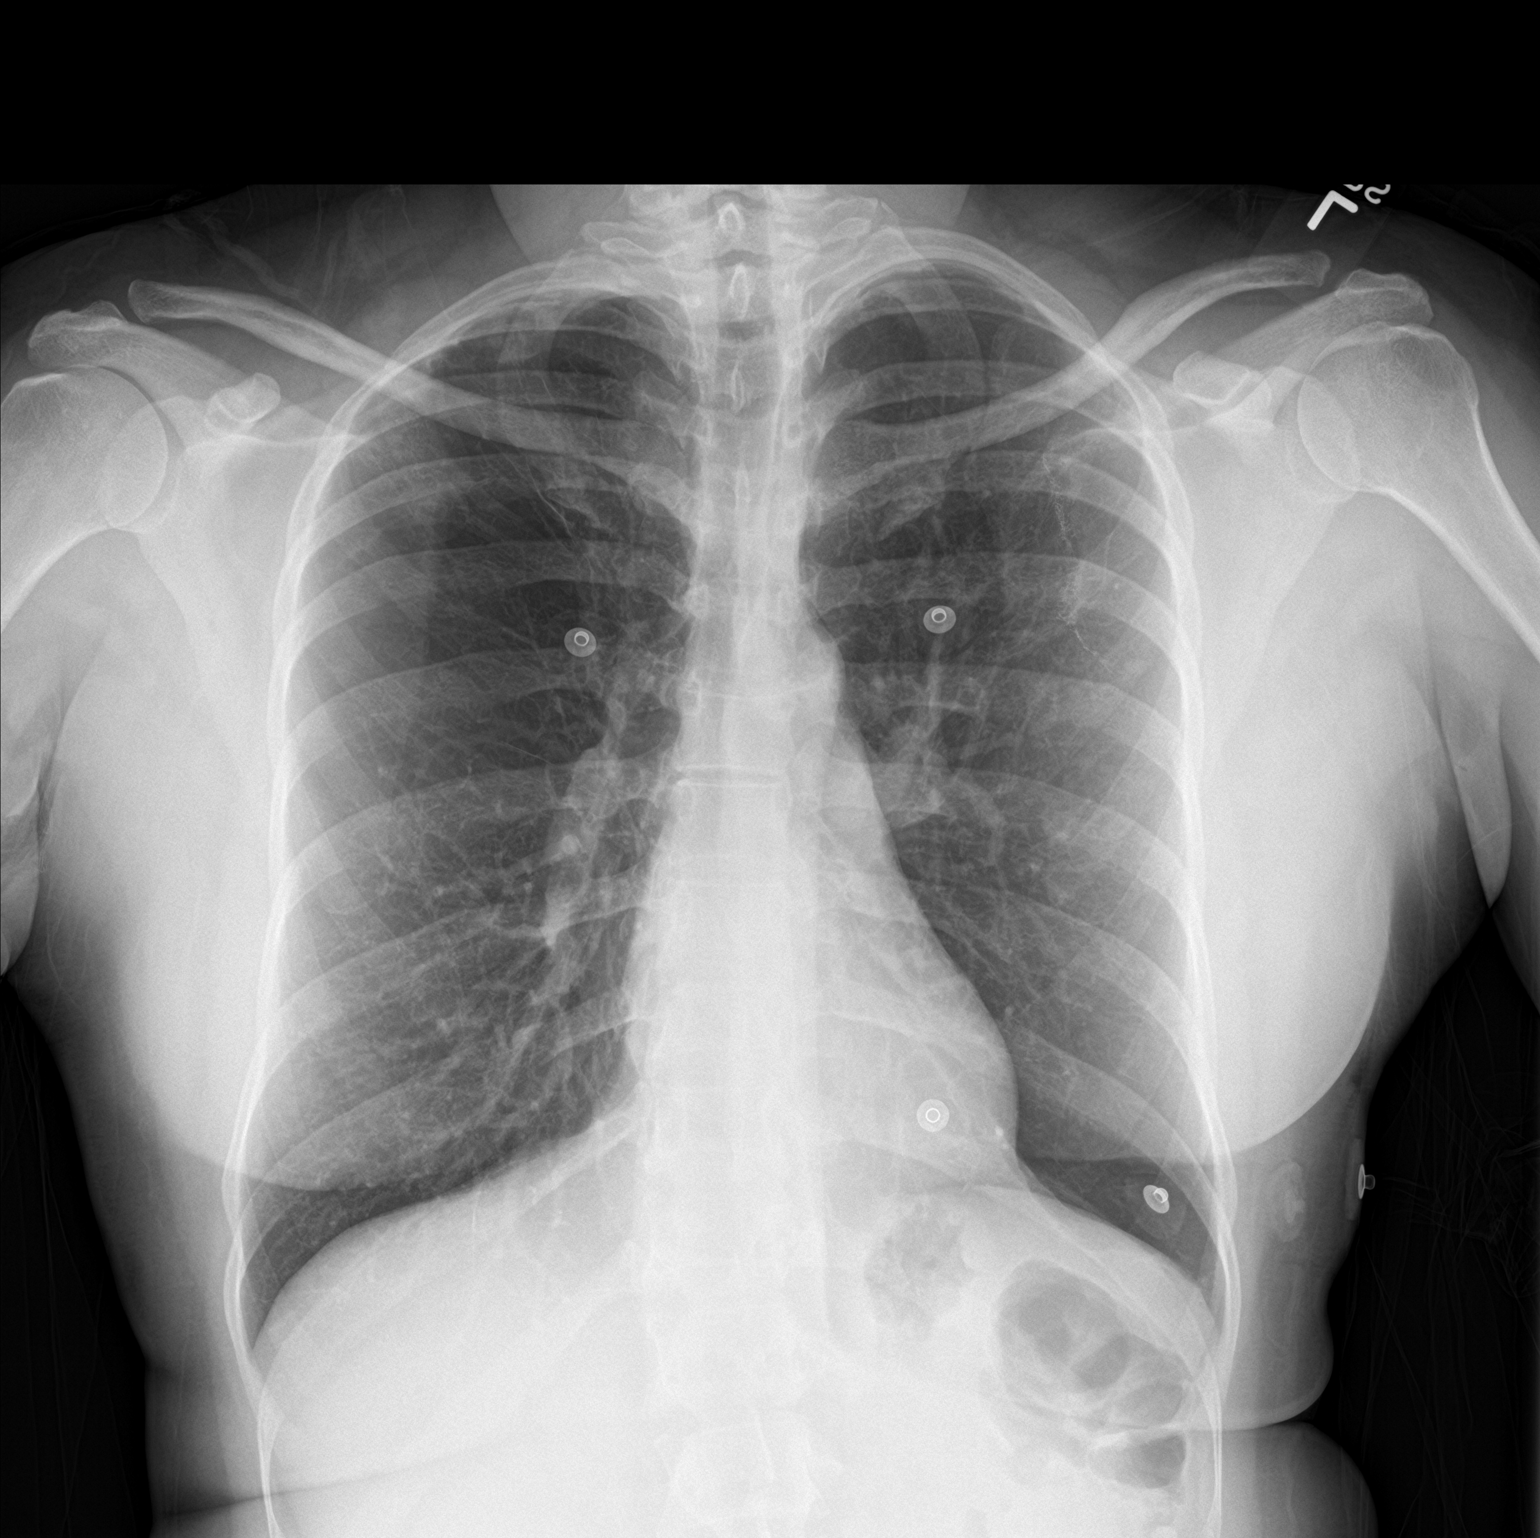

[chest lat]
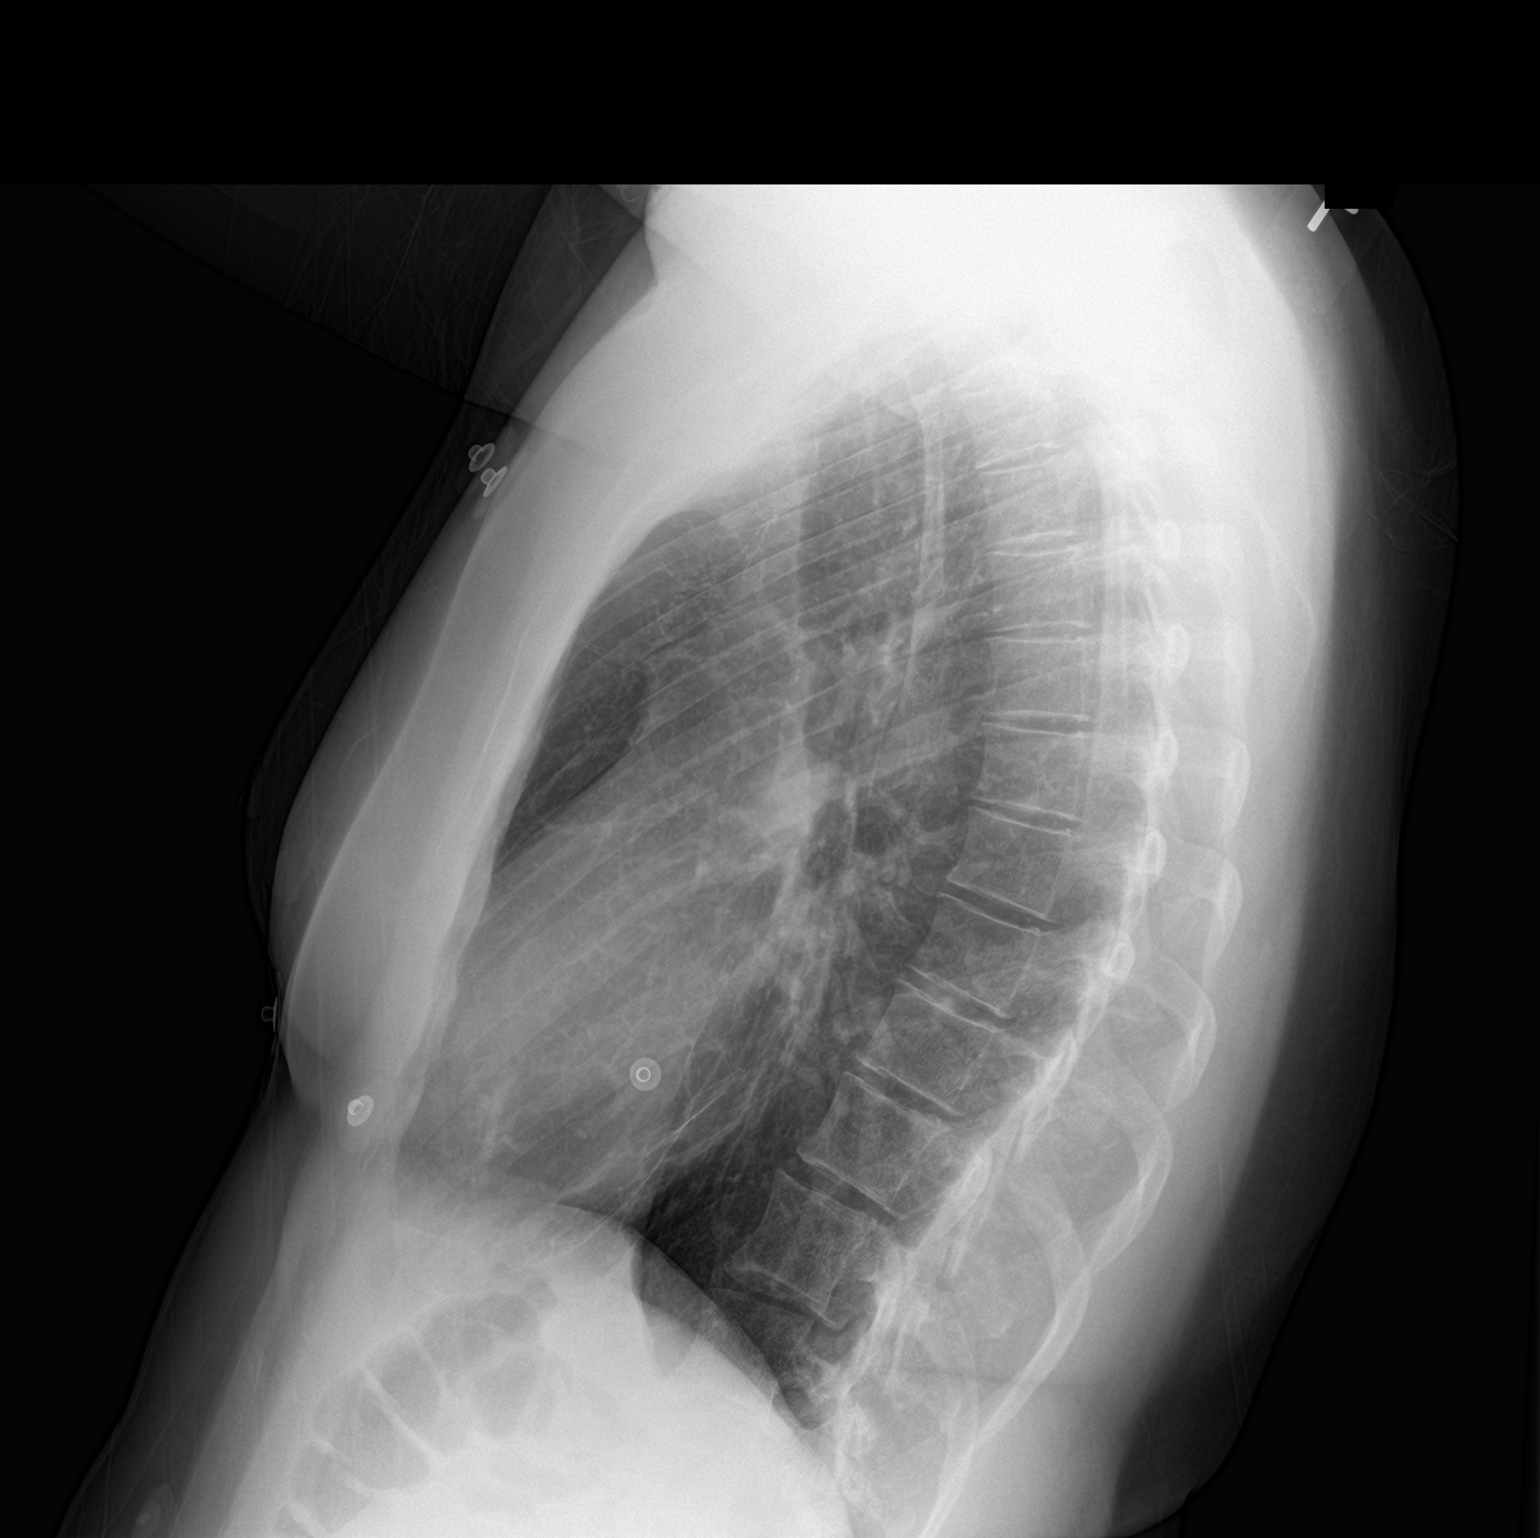

[2 of 2 positions shown; findings below may reference images not displayed]

FINDINGS: Lungs are hyperinflated. There is suture material seen within the
left upper lobe. There is no focal airspace consolidation or
pulmonary edema. No pneumothorax or pleural effusion.
Cardiomediastinal contours are normal.
IMPRESSION: No focal airspace disease.

## 2015-11-05 MED ORDER — ASPIRIN 81 MG PO CHEW
324.0000 mg | CHEWABLE_TABLET | Freq: Once | ORAL | Status: AC
  Administered 2015-11-05: 324 mg via ORAL
  Filled 2015-11-05: qty 4

## 2015-11-05 NOTE — ED Triage Notes (Signed)
Patient comes via EMS from doctors office for palpations.  Complains of lightheadedness but denies any chest pain at this time.  Patient A&Ox4. For EMS she will run heart rates of 90s and then jump up to the 120s-130s

## 2015-11-05 NOTE — ED Provider Notes (Signed)
[facility] DEPT Provider Note  CSN: [id number] Arrival date & time: 11/05/15  2024  History   Chief Complaint Chief Complaint  Patient presents with  . Palpitations    HPI Hannah Barron is a 45 y.o. female.   Palpitations   This is a new problem. The current episode started yesterday. The problem occurs rarely. The problem has been gradually worsening. The problem is associated with exercise. Associated symptoms include chest pain, chest pressure, exertional chest pressure, irregular heartbeat and shortness of breath. Pertinent negatives include no diaphoresis, no fever, no claudication, no orthopnea, no abdominal pain, no nausea, no leg pain, no lower extremity edema, no cough, no hemoptysis and no sputum production.    Past Medical History:  Diagnosis Date  . Bronchitis   . COPD (chronic obstructive pulmonary disease) ([place])   . Migraines     Patient Active Problem List   Diagnosis Date Noted  . GERD (gastroesophageal reflux disease) 07/30/2015  . Bronchiectasis without acute exacerbation ([place]) 07/08/2015  . COPD (chronic obstructive pulmonary disease) ([place]) 06/04/2015  . Fatigue 06/04/2015   Past Surgical History:  Procedure Laterality Date  . CESAREAN SECTION    . LUNG BIOPSY     OB History    Gravida Para Term Preterm AB Living   1 1 1     1    SAB TAB Ectopic Multiple Live Births                 Home Medications    Prior to Admission medications   Medication Sig Start Date End Date Taking? Authorizing Provider  albuterol (PROVENTIL HFA;VENTOLIN HFA) 108 (90 Base) MCG/ACT inhaler Inhale 2 puffs into the lungs every 6 (six) hours as needed for wheezing or shortness of breath. 07/30/15   Magdalen Spatz, NP  budesonide-formoterol (SYMBICORT) 80-4.5 MCG/ACT inhaler Inhale 2 puffs into the lungs 2 (two) times daily.    Historical Provider, MD  cetirizine (ZYRTEC) 10 MG tablet TAKE 1 TABLET (10 MG TOTAL) BY MOUTH DAILY. 09/16/15   Praveen Mannam, MD  dicyclomine  (BENTYL) 20 MG tablet Take 20 mg by mouth every 6 (six) hours.    Historical Provider, MD  fluticasone (FLONASE) 50 MCG/ACT nasal spray Place 2 sprays into both nostrils daily. 07/08/15   Praveen Mannam, MD  ibuprofen (ADVIL,MOTRIN) 600 MG tablet Take 1 tablet (600 mg total) by mouth every 6 (six) hours as needed for moderate pain. 06/11/15   Lavonia Drafts, MD  levofloxacin (LEVAQUIN) 750 MG tablet Take 1 tablet (750 mg total) by mouth daily. 10/28/15   Magdalen Spatz, NP  naproxen (NAPROSYN) 500 MG tablet Take 500 mg by mouth 2 (two) times daily. 05/09/15   Historical Provider, MD  omeprazole (PRILOSEC) 40 MG capsule Take 40 mg by mouth daily.    Historical Provider, MD  predniSONE (DELTASONE) 20 MG tablet Take 4 tabs x 2 days, then 3 tabs x 2 days, then 2 tabs x 2 days, then 1 tab x 2 days, then stop. 10/28/15   Magdalen Spatz, NP  ranitidine (ZANTAC) 150 MG tablet Take 1 tablet (150 mg total) by mouth 2 (two) times daily. 07/30/15   Magdalen Spatz, NP  Respiratory Therapy Supplies (FLUTTER) DEVI Use As Directed 07/08/15   Marshell Garfinkel, MD  RESTASIS 0.05 % ophthalmic emulsion INSTILL 1 DROP IN [facility] EYE TWICE DAILY. 04/25/15   Historical Provider, MD    Family History Family History  Problem Relation Age of Onset  . Healthy Mother   .  Healthy Father     Social History Social History  Substance Use Topics  . Smoking status: Never Smoker  . Smokeless tobacco: Never Used  . Alcohol use No     Allergies   Review of patient's allergies indicates no known allergies.   Review of Systems Review of Systems  Constitutional: Negative for diaphoresis and fever.  Respiratory: Positive for shortness of breath. Negative for cough, hemoptysis and sputum production.   Cardiovascular: Positive for chest pain and palpitations. Negative for orthopnea and claudication.  Gastrointestinal: Negative for abdominal pain and nausea.  All other systems reviewed and are negative.    Physical  Exam Updated Vital Signs BP 145/88   Pulse 98   Temp 99 F (37.2 C) (Oral)   Resp 23   LMP 10/27/2015   SpO2 99%   Physical Exam  Constitutional: She is oriented to person, place, and time. She appears well-developed and well-nourished. No distress.  HENT:  Head: Normocephalic.  Eyes: Pupils are equal, round, and reactive to light.  Neck: Normal range of motion. Neck supple.  Cardiovascular: Normal rate.   No murmur heard. Tachycardic  Pulmonary/Chest: Effort normal. No respiratory distress. She has no wheezes. She has no rales.  Abdominal: Soft. She exhibits no distension. There is no tenderness. There is no guarding.  Musculoskeletal: Normal range of motion. She exhibits no edema.  Neurological: She is alert and oriented to person, place, and time. No cranial nerve deficit.  Skin: Skin is warm. Capillary refill takes less than 2 seconds. She is not diaphoretic.  Psychiatric: Her behavior is normal.  Nursing note and vitals reviewed.  ED Treatments / Results  Labs (all labs ordered are listed, but only abnormal results are displayed) Labs Reviewed  BASIC METABOLIC PANEL - Abnormal; Notable for the following:       Result Value   Glucose, Bld 106 (*)    All other components within normal limits  CBC WITH DIFFERENTIAL/PLATELET - Abnormal; Notable for the following:    WBC 11.3 (*)    Neutro Abs 8.8 (*)    All other components within normal limits  TROPONIN I - Abnormal; Notable for the following:    Troponin I 0.04 (*)    All other components within normal limits  D-DIMER, QUANTITATIVE (NOT AT [facility])  I-STAT TROPOININ, ED   EKG  EKG Interpretation  Date/Time:  Thursday November 05 2015 20:30:26 EDT Ventricular Rate:  96 PR Interval:    QRS Duration: 64 QT Interval:  328 QTC Calculation: 415 R Axis:   57 Text Interpretation:  Sinus rhythm Left atrial enlargement No previous ECGs available Confirmed by LITTLE MD, RACHEL [id number]) on 11/05/2015 9:46:33 PM       Radiology No results found.  Procedures Procedures (including critical care time)  Medications Ordered in ED Medications - No data to display  Initial Impression / Assessment and Plan / ED Course  I have reviewed the triage vital signs and the nursing notes.  Pertinent labs & imaging results that were available during my care of the patient were reviewed by me and considered in my medical decision making (see chart for details).  Clinical Course   Patient presents to the ED with chest pressure, palpitations and SOB that started yesterday for the first time. Patient endorses that it is worse with exertion but also occurs spontaneously.  Denies any additional symptoms.   No leg swelling.   Low well's. D-dimer negative. Doubt PE.   Afebrile, no significant cough and negative  chest x-ray for PNA.   ACS seems unlikely and this is more than likely palpitations. Patient denies any new medications, caffeine intake or drug use.   Troponin positive. HEART score a 4. Possibly demand ischemia from tachyarrhythmia vs ACS. ASA given.  Will admit to hospitalist for trending troponin and telemetry.   Final Clinical Impressions(s) / ED Diagnoses   Final diagnoses:  Palpitations  Chest pressure     Roberto Scales, MD 11/05/15 Stella, MD 11/06/15 2009

## 2015-11-06 ENCOUNTER — Other Ambulatory Visit (HOSPITAL_COMMUNITY): Payer: Medicare Other

## 2015-11-06 ENCOUNTER — Encounter (HOSPITAL_COMMUNITY): Payer: Self-pay | Admitting: *Deleted

## 2015-11-06 DIAGNOSIS — R002 Palpitations: Secondary | ICD-10-CM

## 2015-11-06 DIAGNOSIS — R079 Chest pain, unspecified: Secondary | ICD-10-CM | POA: Diagnosis not present

## 2015-11-06 DIAGNOSIS — R0602 Shortness of breath: Secondary | ICD-10-CM | POA: Diagnosis not present

## 2015-11-06 DIAGNOSIS — K219 Gastro-esophageal reflux disease without esophagitis: Secondary | ICD-10-CM | POA: Diagnosis not present

## 2015-11-06 DIAGNOSIS — R0789 Other chest pain: Secondary | ICD-10-CM | POA: Diagnosis not present

## 2015-11-06 LAB — TROPONIN I
TROPONIN I: 0.04 ng/mL — AB (ref <0.03)
Troponin I: 0.14 ng/mL (ref <0.03)

## 2015-11-06 LAB — TSH: TSH: 0.74 u[IU]/mL (ref 0.350–4.500)

## 2015-11-06 LAB — HCG, SERUM, QUALITATIVE: PREG SERUM: NEGATIVE

## 2015-11-06 LAB — T4, FREE: FREE T4: 1.19 ng/dL — AB (ref 0.61–1.12)

## 2015-11-06 MED ORDER — PANTOPRAZOLE SODIUM 40 MG PO TBEC
40.0000 mg | DELAYED_RELEASE_TABLET | Freq: Every day | ORAL | Status: DC
  Administered 2015-11-06 – 2015-11-07 (×2): 40 mg via ORAL
  Filled 2015-11-06 (×2): qty 1

## 2015-11-06 MED ORDER — INFLUENZA VAC SPLIT QUAD 0.5 ML IM SUSY
0.5000 mL | PREFILLED_SYRINGE | INTRAMUSCULAR | Status: AC
  Administered 2015-11-07: 0.5 mL via INTRAMUSCULAR
  Filled 2015-11-06: qty 0.5

## 2015-11-06 MED ORDER — ASPIRIN EC 325 MG PO TBEC
325.0000 mg | DELAYED_RELEASE_TABLET | Freq: Every day | ORAL | Status: DC
  Administered 2015-11-06 – 2015-11-07 (×2): 325 mg via ORAL
  Filled 2015-11-06 (×2): qty 1

## 2015-11-06 MED ORDER — ONDANSETRON HCL 4 MG/2ML IJ SOLN: 4.0000 mg | Freq: Four times a day (QID) | INTRAMUSCULAR | Status: DC | PRN

## 2015-11-06 MED ORDER — PNEUMOCOCCAL VAC POLYVALENT 25 MCG/0.5ML IJ INJ
0.5000 mL | INJECTION | INTRAMUSCULAR | Status: AC
  Administered 2015-11-07: 0.5 mL via INTRAMUSCULAR
  Filled 2015-11-06: qty 0.5

## 2015-11-06 MED ORDER — ALBUTEROL SULFATE (2.5 MG/3ML) 0.083% IN NEBU
3.0000 mL | INHALATION_SOLUTION | Freq: Two times a day (BID) | RESPIRATORY_TRACT | Status: DC
  Administered 2015-11-07: 3 mL via RESPIRATORY_TRACT
  Filled 2015-11-06: qty 3

## 2015-11-06 MED ORDER — IBUPROFEN 200 MG PO TABS
400.0000 mg | ORAL_TABLET | Freq: Four times a day (QID) | ORAL | Status: DC | PRN
  Administered 2015-11-06: 400 mg via ORAL
  Filled 2015-11-06: qty 2

## 2015-11-06 MED ORDER — ALBUTEROL SULFATE (2.5 MG/3ML) 0.083% IN NEBU
3.0000 mL | INHALATION_SOLUTION | RESPIRATORY_TRACT | Status: DC
  Administered 2015-11-06: 3 mL via RESPIRATORY_TRACT
  Filled 2015-11-06: qty 3

## 2015-11-06 MED ORDER — ACETAMINOPHEN 325 MG PO TABS: 650.0000 mg | ORAL_TABLET | ORAL | Status: DC | PRN

## 2015-11-06 NOTE — Progress Notes (Signed)
Pt c/o chest tightness, oxygen applied at 2 Liters. Chest tightness decreased after oxygen was applied, will continue to monitor.

## 2015-11-06 NOTE — Progress Notes (Signed)
PROGRESS NOTE    Hannah Barron  [id number] DOB: November 23, 1970 DOA: 11/05/2015 PCP: Mauricio Po, FNP    Brief Narrative:  45 yo female with cystic fibrosis, presents with palpitations and dyspnea, episodic. On exam not in heart failure, noted mild troponin elevation.   Assessment & Plan:   Principal Problem:   Chest pain Active Problems:   COPD (chronic obstructive pulmonary disease) (HCC)   GERD (gastroesophageal reflux disease)   Chest pressure   1. Palpitations with troponin elevation. History suggestive of SVT, will consult cardiology for event monitor placement, elevated cardiac markers possible related to SVT/ may need stress test.. ekg personally reviewed no signs of ischemia, will continue tylenol and will add ibuprofen for pain control.   2. Cystic fibrosis. Will continue oxymetry monitoring, supplemental 02 per [place], keep 02 above 92%, albuterol as needed.  Chest film with positive hyperinflation.   DVT prophylaxis: scd  Code Status: full code  Family Communication: I spoke with patient's family at the bedside and all questions were addressed Disposition Plan: home  Consultants:   Cardiology    Procedures:     Antimicrobials:     Subjective: Patient with no chest pain, no dyspnea, no nausea or vomiting. Has episode of palpitations at home, associated with dyspnea.   Objective: Vitals:   11/06/15 0027 11/06/15 0534 11/06/15 0939 11/06/15 1301  BP: (!) 142/90 101/64 129/70 114/64  Pulse: 92 76 (!) 105 87  Resp: 18 18  18   Temp: 98.6 F (37 C) 98.2 F (36.8 C)  98.1 F (36.7 C)  TempSrc: Oral Oral  Oral  SpO2: 100% 100% 100% 96%  Weight: 75.4 kg (166 lb 3.2 oz)     Height: 5\' 7"  (1.702 m)       Intake/Output Summary (Last 24 hours) at 11/06/15 1549 Last data filed at 11/06/15 0957  Gross per 24 hour  Intake              360 ml  Output                0 ml  Net              360 ml   Filed Weights   11/06/15 0027  Weight: 75.4 kg (166 lb 3.2  oz)    Examination:  General exam: Not in pain or dyspnea.  Respiratory system: decrease breath sounds, positive air trapping.  Respiratory effort normal. Cardiovascular system: S1 & S2 heard, RRR. No JVD, murmurs, rubs, gallops or clicks. No pedal edema. Gastrointestinal system: Abdomen is nondistended, soft and nontender. No organomegaly or masses felt. Normal bowel sounds heard. Central nervous system: Alert and oriented. No focal neurological deficits. Extremities: Symmetric 5 x 5 power. Skin: No rashes, lesions or ulcers      Data Reviewed: I have personally reviewed following labs and imaging studies  CBC:  Recent Labs Lab 11/05/15 2149  WBC 11.3*  NEUTROABS 8.8*  HGB 13.8  HCT 40.3  MCV 92.0  PLT [id number]   Basic Metabolic Panel:  Recent Labs Lab 11/05/15 2149  NA 135  K 4.1  CL 103  CO2 23  GLUCOSE 106*  BUN 13  CREATININE 0.83  CALCIUM 9.5   GFR: Estimated Creatinine Clearance: 90.7 mL/min (by C-G formula based on SCr of 0.83 mg/dL). Liver Function Tests: No results for input(s): AST, ALT, ALKPHOS, BILITOT, PROT, ALBUMIN in the last 168 hours. No results for input(s): LIPASE, AMYLASE in the last 168 hours. No results for input(s): AMMONIA  in the last 168 hours. Coagulation Profile: No results for input(s): INR, PROTIME in the last 168 hours. Cardiac Enzymes:  Recent Labs Lab 11/05/15 2149 11/06/15 0114 11/06/15 0410 11/06/15 0654  TROPONINI 0.04* 0.14* 0.04* <0.03   BNP (last 3 results) No results for input(s): PROBNP in the last 8760 hours. HbA1C: No results for input(s): HGBA1C in the last 72 hours. CBG: No results for input(s): GLUCAP in the last 168 hours. Lipid Profile: No results for input(s): CHOL, HDL, LDLCALC, TRIG, CHOLHDL, LDLDIRECT in the last 72 hours. Thyroid Function Tests:  Recent Labs  11/06/15 0114 11/06/15 1057  TSH 0.740  --   FREET4  --  1.19*   Anemia Panel: No results for input(s): VITAMINB12, FOLATE, FERRITIN,  TIBC, IRON, RETICCTPCT in the last 72 hours. Sepsis Labs: No results for input(s): PROCALCITON, LATICACIDVEN in the last 168 hours.  No results found for this or any previous visit (from the past 240 hour(s)).       Radiology Studies: Dg Chest 2 View  Result Date: 11/05/2015 CLINICAL DATA:  Shortness of breath and palpitations EXAM: CHEST  2 VIEW COMPARISON:  Chest radiograph 10/28/2015 and CT chest 07/16/2015 FINDINGS: Lungs are hyperinflated. There is suture material seen within the left upper lobe. There is no focal airspace consolidation or pulmonary edema. No pneumothorax or pleural effusion. Cardiomediastinal contours are normal. IMPRESSION: No focal airspace disease. Electronically Signed   By: Ulyses Jarred M.D.   On: 11/05/2015 22:35        Scheduled Meds: . aspirin EC  325 mg Oral Daily  . [START ON 11/07/2015] Influenza vac split quadrivalent PF  0.5 mL Intramuscular Tomorrow-1000  . [START ON 11/07/2015] pneumococcal 23 valent vaccine  0.5 mL Intramuscular Tomorrow-1000   Continuous Infusions:    LOS: 0 days       Mauricio Gerome Apley, MD [facility] Pager [phone]  If 7PM-7AM, please contact night-coverage [email or web address] Password [facility] 11/06/2015, 3:49 PM

## 2015-11-06 NOTE — Consult Note (Signed)
Cardiology Consult    Patient ID: Hannah Barron MRN: [id number], DOB/AGE: [birth year]   Admit date: 11/05/2015 Date of Consult: 11/06/2015  Primary Physician: Mauricio Po, [place] Reason for Consult: chest pain Primary Cardiologist: New Requesting Provider: Dr. Cathlean Sauer  Patient Profile    Ms. Hannah Barron is a 45 year old female with a history of COPD and cystic fibrosis.   History of Present Illness  [name] tells me that abut 2 days ago she was sitting in her home and she developed palpitations associated with chest pain and diaphoresis. The first episode lasted about 20 minutes and resolved spontaneously. As the palpitations resolve, her chest pain resolves. Episodes of palpitations and chest pain became more and more frequent so she decided to seek care in the ED.   Her EKG showed NSR, with no acute ST or T wave changes. Troponin was initially 0.04, then 0.14, last was 0.04.   During the time of my encounter she became SOB and diaphoretic. I reviewed her telemetry which reveals sinus tach with rates in 100-110's.   She denies illicit drug use, does not smoke or drink ETOH.   She is followed by pulmonology and was last seen on 10/28/15 where she reported a 2 week history of SOB, and night sweats. She also reported 5 pillow orthopnea which is her baseline, she has slept like that for 14 years due to her bronchchiectasis.   She has no family history of cardiac disease.    Past Medical History   Past Medical History:  Diagnosis Date  . Bronchitis   . COPD (chronic obstructive pulmonary disease) ([place])   . Migraines     Past Surgical History:  Procedure Laterality Date  . CESAREAN SECTION    . LUNG BIOPSY       Allergies  No Known Allergies  Inpatient Medications    . albuterol  1-2 puff Inhalation Q4H  . aspirin EC  325 mg Oral Daily  . [START ON 11/07/2015] Influenza vac split quadrivalent PF  0.5 mL Intramuscular Tomorrow-1000  . pantoprazole  40 mg Oral Daily  .  [START ON 11/07/2015] pneumococcal 23 valent vaccine  0.5 mL Intramuscular Tomorrow-1000    Family History    Family History  Problem Relation Age of Onset  . Healthy Mother   . Healthy Father     Social History    Social History   Social History  . Marital status: Married    Spouse name: N/A  . Number of children: 1  . Years of education: 89   Occupational History  . disability    Social History Main Topics  . Smoking status: Never Smoker  . Smokeless tobacco: Never Used  . Alcohol use No  . Drug use: No  . Sexual activity: Yes   Other Topics Concern  . Not on file   Social History Narrative   Fun: Watch TV     Review of Systems    General:  No chills, fever, night sweats or weight changes.  Cardiovascular:  No chest pain, dyspnea on exertion, edema, +orthopnea, +palpitations, paroxysmal nocturnal dyspnea. Dermatological: No rash, lesions/masses Respiratory: No cough, dyspnea Urologic: No hematuria, dysuria Abdominal:   No nausea, vomiting, diarrhea, bright red blood per rectum, melena, or hematemesis Neurologic:  No visual changes, wkns, changes in mental status. All other systems reviewed and are otherwise negative except as noted above.  Physical Exam    Blood pressure 114/64, pulse 87, temperature 98.1 F (36.7 C), temperature source  Oral, resp. rate 18, height 5\' 7"  (1.702 m), weight 166 lb 3.2 oz (75.4 kg), last menstrual period 10/27/2015, SpO2 96 %.  General: Pleasant, NAD Psych: Normal affect. Neuro: Alert and oriented X 3. Moves all extremities spontaneously. HEENT: Normal  Neck: Supple without bruits or JVD. Lungs:  Resp regular and unlabored, CTA. Heart: RRR no s3, s4, or murmurs. Abdomen: Soft, non-tender, non-distended, BS + x 4.  Extremities: No clubbing, cyanosis or edema. DP/PT/Radials 2+ and equal bilaterally.  Labs    Troponin [facility] Test)  Recent Labs  11/05/15 2154  TROPIPOC 0.00    Recent Labs  11/05/15 2149  11/06/15 0114 11/06/15 0410 11/06/15 0654  TROPONINI 0.04* 0.14* 0.04* <0.03   Lab Results  Component Value Date   WBC 11.3 (H) 11/05/2015   HGB 13.8 11/05/2015   HCT 40.3 11/05/2015   MCV 92.0 11/05/2015   PLT 356 11/05/2015    Recent Labs Lab 11/05/15 2149  NA 135  K 4.1  CL 103  CO2 23  BUN 13  CREATININE 0.83  CALCIUM 9.5  GLUCOSE 106*   No results found for: CHOL, HDL, LDLCALC, TRIG Lab Results  Component Value Date   DDIMER <0.27 11/05/2015     Radiology Studies    Dg Chest 2 View  Result Date: 11/05/2015 CLINICAL DATA:  Shortness of breath and palpitations EXAM: CHEST  2 VIEW COMPARISON:  Chest radiograph 10/28/2015 and CT chest 07/16/2015 FINDINGS: Lungs are hyperinflated. There is suture material seen within the left upper lobe. There is no focal airspace consolidation or pulmonary edema. No pneumothorax or pleural effusion. Cardiomediastinal contours are normal. IMPRESSION: No focal airspace disease. Electronically Signed   By: Ulyses Jarred M.D.   On: 11/05/2015 22:35   Dg Chest 2 View  Result Date: 10/29/2015 CLINICAL DATA:  Shortness of breath with cough and congestion for 2 weeks. History of bronchiectasis. EXAM: CHEST  2 VIEW COMPARISON:  Chest CTA 07/16/2015 FINDINGS: Scarring at the right lung apex. Surgical changes in the left upper lung. Lungs are clear without airspace disease or pulmonary edema. No pleural effusions. Heart and mediastinum are within normal limits. Trachea is midline. No acute bone abnormality. IMPRESSION: No active cardiopulmonary disease. Electronically Signed   By: Markus Daft M.D.   On: 10/29/2015 08:05    EKG & Cardiac Imaging    EKG: NSR  Echocardiogram: pending   Assessment & Plan  1. Chest pain/palpitations: Has episodes of Sinus tach which are associated with diaphoresis, palpitations and chest pain. Will check Echo to further assess for structural heart disease or wall motion abnormalities  Also, will set her up with  30 day monitor to assess for tachyarrhythmias.   I wonder if there is an anxiety component to this as well.    Signed, Arbutus Leas, NP 11/06/2015, 4:14 PM Pager: [phone] Patient seen and examined and history reviewed. Agree with above findings and plan. Pleasant 45 yo AF presents with symptoms of tachycardia associated with sweats, feeling warm, and dizziness. She has some chest pain associated with this. No history of HTN, HL, DM, tob use, or Fam hx of CAD. On exam she is pleasant in NAD Lungs clear CV without gallop or murmur Abdomen Nl. No edema  Ecg is normal. Troponin slightly elevated. CXR without acute change  Telemetry during episodes show sinus tachy with rate up to 120.  Impression Palpitations associated with chest pain. Monitor shows sinus tachy. No risk factors for CAD.   Will check  Echo. Continue to monitor on telemetry. Could arrange for 30 day event monitor. She does report more dyspnea recently so it is possible that her bronchiectasis may be driving this.   Lanell Carpenter [place], [place] 11/06/2015 5:19 PM

## 2015-11-06 NOTE — Progress Notes (Signed)
Pt c/o chest pain mid sternal 8/10.  Notified Dr. Cathlean Sauer whether to give pt nitro S.L.  Instructed not to will other other pain med.  BP 121/7, P91. EKG NSR 89. Will continue to monitor.  Karie Kirks, Therapist, sports.

## 2015-11-06 NOTE — Plan of Care (Signed)
Problem: Phase I Progression Outcomes Goal: MD aware of Cardiac Marker results Outcome: Progressing MD is aware of Troponin level.

## 2015-11-06 NOTE — H&P (Signed)
History and Physical    Hannah Barron [id number] DOB: 1970/04/07 DOA: 11/05/2015  PCP: Mauricio Po, FNP  Patient coming from: home  Chief Complaint:  Chest pain  HPI: Hannah Barron is a 45 y.o. female with medical history significant of copd comes in with one day of waxing and waning sscp that is more like sharp pains associated with palpitations.  Lasts several minutes than spontaneously resolves.  No fevers.  No coughing.  Pain is somewhat reproducible.  She denies any le swelling or edema.  No recent traveling (last travel to [place] in  March).  Pt referred for admission for her chest pain and possible ACS.   Review of Systems: As per HPI otherwise 10 point review of systems negative.   Past Medical History:  Diagnosis Date  . Bronchitis   . COPD (chronic obstructive pulmonary disease) ([place])   . Migraines     Past Surgical History:  Procedure Laterality Date  . CESAREAN SECTION    . LUNG BIOPSY       reports that she has never smoked. She has never used smokeless tobacco. She reports that she does not drink alcohol or use drugs.  No Known Allergies  Family History  Problem Relation Age of Onset  . Healthy Mother   . Healthy Father     Prior to Admission medications   Medication Sig Start Date End Date Taking? Authorizing Provider  albuterol (PROVENTIL HFA;VENTOLIN HFA) 108 (90 Base) MCG/ACT inhaler Inhale 2 puffs into the lungs every 6 (six) hours as needed for wheezing or shortness of breath. 07/30/15   Magdalen Spatz, NP  budesonide-formoterol (SYMBICORT) 80-4.5 MCG/ACT inhaler Inhale 2 puffs into the lungs 2 (two) times daily.    Historical Provider, MD  cetirizine (ZYRTEC) 10 MG tablet TAKE 1 TABLET (10 MG TOTAL) BY MOUTH DAILY. 09/16/15   Praveen Mannam, MD  dicyclomine (BENTYL) 20 MG tablet Take 20 mg by mouth every 6 (six) hours.    Historical Provider, MD  fluticasone (FLONASE) 50 MCG/ACT nasal spray Place 2 sprays into both nostrils daily. 07/08/15   Praveen  Mannam, MD  ibuprofen (ADVIL,MOTRIN) 600 MG tablet Take 1 tablet (600 mg total) by mouth every 6 (six) hours as needed for moderate pain. 06/11/15   Lavonia Drafts, MD  levofloxacin (LEVAQUIN) 750 MG tablet Take 1 tablet (750 mg total) by mouth daily. 10/28/15   Magdalen Spatz, NP  naproxen (NAPROSYN) 500 MG tablet Take 500 mg by mouth 2 (two) times daily. 05/09/15   Historical Provider, MD  omeprazole (PRILOSEC) 40 MG capsule Take 40 mg by mouth daily.    Historical Provider, MD  predniSONE (DELTASONE) 20 MG tablet Take 4 tabs x 2 days, then 3 tabs x 2 days, then 2 tabs x 2 days, then 1 tab x 2 days, then stop. 10/28/15   Magdalen Spatz, NP  ranitidine (ZANTAC) 150 MG tablet Take 1 tablet (150 mg total) by mouth 2 (two) times daily. 07/30/15   Magdalen Spatz, NP  Respiratory Therapy Supplies (FLUTTER) DEVI Use As Directed 07/08/15   Marshell Garfinkel, MD  RESTASIS 0.05 % ophthalmic emulsion INSTILL 1 DROP IN [facility] EYE TWICE DAILY. 04/25/15   Historical Provider, MD    Physical Exam: Vitals:   11/05/15 2145 11/05/15 2148 11/05/15 2300 11/05/15 2330  BP: (!) 147/107 (!) 147/107 132/86 125/78  Pulse: 91 98 84 78  Resp: 16 19 12 17   Temp:      TempSrc:  SpO2: 98% 99% 99% 99%      Constitutional: NAD, calm, comfortable Vitals:   11/05/15 2145 11/05/15 2148 11/05/15 2300 11/05/15 2330  BP: (!) 147/107 (!) 147/107 132/86 125/78  Pulse: 91 98 84 78  Resp: 16 19 12 17   Temp:      TempSrc:      SpO2: 98% 99% 99% 99%   Eyes: PERRL, lids and conjunctivae normal ENMT: Mucous membranes are moist. Posterior pharynx clear of any exudate or lesions.Normal dentition.  Neck: normal, supple, no masses, no thyromegaly Respiratory: clear to auscultation bilaterally, no wheezing, no crackles. Normal respiratory effort. No accessory muscle use.  Cardiovascular: Regular rate and rhythm, no murmurs / rubs / gallops. No extremity edema. 2+ pedal pulses. No carotid bruits.  Abdomen: no tenderness, no  masses palpated. No hepatosplenomegaly. Bowel sounds positive.  Musculoskeletal: no clubbing / cyanosis. No joint deformity upper and lower extremities. Good ROM, no contractures. Normal muscle tone.  Skin: no rashes, lesions, ulcers. No induration Neurologic: CN 2-12 grossly intact. Sensation intact, DTR normal. Strength 5/5 in all 4.  Psychiatric: Normal judgment and insight. Alert and oriented x 3. Normal mood.    Labs on Admission: I have personally reviewed following labs and imaging studies  CBC:  Recent Labs Lab 11/05/15 2149  WBC 11.3*  NEUTROABS 8.8*  HGB 13.8  HCT 40.3  MCV 92.0  PLT [id number]   Basic Metabolic Panel:  Recent Labs Lab 11/05/15 2149  NA 135  K 4.1  CL 103  CO2 23  GLUCOSE 106*  BUN 13  CREATININE 0.83  CALCIUM 9.5   GFR: Estimated Creatinine Clearance: 93.9 mL/min (by C-G formula based on SCr of 0.83 mg/dL).  Cardiac Enzymes:  Recent Labs Lab 11/05/15 2149  TROPONINI 0.04*    Urine analysis:    Component Value Date/Time   COLORURINE YELLOW 05/09/2015 0025   APPEARANCEUR CLEAR 05/09/2015 0025   LABSPEC 1.013 05/09/2015 0025   PHURINE 6.0 05/09/2015 0025   GLUCOSEU NEGATIVE 05/09/2015 0025   HGBUR NEGATIVE 05/09/2015 0025   BILIRUBINUR NEGATIVE 05/09/2015 0025   KETONESUR NEGATIVE 05/09/2015 0025   PROTEINUR NEGATIVE 05/09/2015 0025   NITRITE NEGATIVE 05/09/2015 0025   LEUKOCYTESUR TRACE (A) 05/09/2015 0025   Radiological Exams on Admission: Dg Chest 2 View  Result Date: 11/05/2015 CLINICAL DATA:  Shortness of breath and palpitations EXAM: CHEST  2 VIEW COMPARISON:  Chest radiograph 10/28/2015 and CT chest 07/16/2015 FINDINGS: Lungs are hyperinflated. There is suture material seen within the left upper lobe. There is no focal airspace consolidation or pulmonary edema. No pneumothorax or pleural effusion. Cardiomediastinal contours are normal. IMPRESSION: No focal airspace disease. Electronically Signed   By: Ulyses Jarred M.D.   On:  11/05/2015 22:35    EKG: Independently reviewed. nsr no acute issues  LMP last week  Assessment/Plan 45 yo female with low risk factors with atypical chest pain and palpitations  Principal Problem:   Chest pain- serial trop/  Obtain cardiac echo in the am.  Tele monitoring.  Aspirin.  ddimer neg so low threshold for PE.  Check TSH level.  Active Problems:   COPD (chronic obstructive pulmonary disease) (HCC)   GERD (gastroesophageal reflux disease)   Chest pressure    DVT prophylaxis: scds  Code Status:  full Family Communication: none Disposition Plan:  Per day team Consults called:  none Admission status:  observation   Keshawn Fiorito A MD [facility]  If 7PM-7AM, please contact night-coverage [email or web address] Password [email or web address]  11/06/2015, 12:01 AM

## 2015-11-07 ENCOUNTER — Observation Stay (HOSPITAL_BASED_OUTPATIENT_CLINIC_OR_DEPARTMENT_OTHER): Payer: Medicare Other

## 2015-11-07 ENCOUNTER — Other Ambulatory Visit: Payer: Self-pay | Admitting: Physician Assistant

## 2015-11-07 DIAGNOSIS — R002 Palpitations: Secondary | ICD-10-CM

## 2015-11-07 DIAGNOSIS — R0789 Other chest pain: Secondary | ICD-10-CM

## 2015-11-07 DIAGNOSIS — R000 Tachycardia, unspecified: Secondary | ICD-10-CM | POA: Diagnosis not present

## 2015-11-07 DIAGNOSIS — K219 Gastro-esophageal reflux disease without esophagitis: Secondary | ICD-10-CM

## 2015-11-07 DIAGNOSIS — R9431 Abnormal electrocardiogram [ECG] [EKG]: Secondary | ICD-10-CM

## 2015-11-07 LAB — ECHOCARDIOGRAM COMPLETE
Height: 67 in
Weight: 2659.2 oz

## 2015-11-07 MED ORDER — CETIRIZINE HCL 10 MG PO TABS: 10.0000 mg | ORAL_TABLET | Freq: Every day | ORAL | 0 refills | Status: DC | PRN

## 2015-11-07 NOTE — Progress Notes (Signed)
Patient Name: Hannah Barron Date of Encounter: 11/07/2015  Hospital Problem List     Principal Problem:   Chest pain Active Problems:   COPD (chronic obstructive pulmonary disease) (HCC)   GERD (gastroesophageal reflux disease)   Chest pressure    Patient Profile     Ms. Perrow is a 45 year old female with a history of COPD and cystic fibrosis.   We were consulted secondary to palpitations.    Subjective   No chest pain.  No SOB.    Inpatient Medications    . albuterol  3 mL Inhalation BID  . aspirin EC  325 mg Oral Daily  . pantoprazole  40 mg Oral Daily    Vital Signs    Vitals:   11/06/15 2353 11/07/15 0426 11/07/15 0859 11/07/15 1227  BP: 116/75 114/80  125/73  Pulse: 96 82  92  Resp: 18 17  20   Temp: 98.2 F (36.8 C) 98.2 F (36.8 C)  98.4 F (36.9 C)  TempSrc: Oral Oral  Oral  SpO2: 100% 100% 99% 99%  Weight:      Height:        Intake/Output Summary (Last 24 hours) at 11/07/15 1229 Last data filed at 11/07/15 0929  Gross per 24 hour  Intake              480 ml  Output              650 ml  Net             -170 ml   Filed Weights   11/06/15 0027  Weight: 166 lb 3.2 oz (75.4 kg)    Physical Exam    GEN: Well nourished, well developed, in no acute distress.  Neck: Supple, no JVD, carotid bruits, or masses. Cardiac: RRR, no rubs, or gallops. No clubbing, cyanosis, no edema.  Radials/DP/PT 2+ and equal bilaterally.  Respiratory:  Respirations  regular and unlabored, clear to auscultation bilaterally. GI: Soft, nontender, nondistended, BS + x 4. Neuro:  Strength and sensation are intact.   Labs    CBC  Recent Labs  11/05/15 2149  WBC 11.3*  NEUTROABS 8.8*  HGB 13.8  HCT 40.3  MCV 92.0  PLT [id number]   Basic Metabolic Panel  Recent Labs  11/05/15 2149  NA 135  K 4.1  CL 103  CO2 23  GLUCOSE 106*  BUN 13  CREATININE 0.83  CALCIUM 9.5   Liver Function Tests No results for input(s): AST, ALT, ALKPHOS, BILITOT, PROT, ALBUMIN in the  last 72 hours. No results for input(s): LIPASE, AMYLASE in the last 72 hours. Cardiac Enzymes  Recent Labs  11/06/15 0114 11/06/15 0410 11/06/15 0654  TROPONINI 0.14* 0.04* <0.03   BNP Invalid input(s): POCBNP D-Dimer  Recent Labs  11/05/15 2149  DDIMER <0.27   Hemoglobin A1C No results for input(s): HGBA1C in the last 72 hours. Fasting Lipid Panel No results for input(s): CHOL, HDL, LDLCALC, TRIG, CHOLHDL, LDLDIRECT in the last 72 hours. Thyroid Function Tests  Recent Labs  11/06/15 0114  TSH 0.740    Telemetry    Sinus and sinus tachycardia  ECG    NA  Radiology    Dg Chest 2 View  Result Date: 11/05/2015 CLINICAL DATA:  Shortness of breath and palpitations EXAM: CHEST  2 VIEW COMPARISON:  Chest radiograph 10/28/2015 and CT chest 07/16/2015 FINDINGS: Lungs are hyperinflated. There is suture material seen within the left upper lobe. There is no focal airspace  consolidation or pulmonary edema. No pneumothorax or pleural effusion. Cardiomediastinal contours are normal. IMPRESSION: No focal airspace disease. Electronically Signed   By: Ulyses Jarred M.D.   On: 11/05/2015 22:35   Dg Chest 2 View  Result Date: 10/29/2015 CLINICAL DATA:  Shortness of breath with cough and congestion for 2 weeks. History of bronchiectasis. EXAM: CHEST  2 VIEW COMPARISON:  Chest CTA 07/16/2015 FINDINGS: Scarring at the right lung apex. Surgical changes in the left upper lung. Lungs are clear without airspace disease or pulmonary edema. No pleural effusions. Heart and mediastinum are within normal limits. Trachea is midline. No acute bone abnormality. IMPRESSION: No active cardiopulmonary disease. Electronically Signed   By: Markus Daft M.D.   On: 10/29/2015 08:05    Assessment & Plan    TACHYCARDIA:   Echo results pending.  No plan for ischemia work up.  Probable out patient monitor.    Signed, Minus Breeding, MD  11/07/2015, 12:29 PM

## 2015-11-07 NOTE — Discharge Summary (Signed)
Physician Discharge Summary  Hannah Barron [id number] DOB: 04-Jun-1970 DOA: 11/05/2015  PCP: Dov Dill Po, FNP  Admit date: 11/05/2015 Discharge date: 11/07/2015  Admitted From:  Home Disposition:  Home   Recommendations for Outpatient Follow-up:  1. Follow up with PCP in 1-week 2.   Home Health: Home  Equipment/Devices: no    Discharge Condition: Stable  CODE STATUS: full  Diet recommendation: Regular   Brief/Interim Summary: This is a 45 year old female who presented to hospital with chief complaint of palpitations which were associated with chest pain. They lasted several minutes and were self resolved. No symptoms of heart failure. On her initial physical examination blood pressure was 132/86, heart rate 84, respiratory rate 12, oxygen saturation 99%, oral mucosa was moist, lungs were clear to auscultation bilaterally, decrease air movement, heart S1-S2 present rhythmic no gallops or murmurs, abdomen was soft and nontender lower extremities with no edema. Her EKG was normal sinus rhythm, chest x-ray shows signs of hyperinflation but no infiltrates. Sodium 135, potassium 4.1, bicarbonate 23, creatinine 0.83, BUN 13, troponin 0.04, white count 11.3, hemoglobin 13.8, hematocrit 40.3, platelets 356, d-dimer negative.   Patient was admitted to the hospital with working diagnosis of atypical chest pain rule out acute coronary syndrome.   1. Chest pain or palpitations. Patient remained sinus rhythm with sinus tachycardia on telemetry. Patient was seen by cardiology to arrange outpatient event monitoring, it is likely that patient experienced an episodes of SVT especially considering elevated cardiac markers. Echocardiogram with normal LV function. Will have outpatient event monitor arranged.   2. COPD/cystic fibrosis. Continue outpatient management with rescue inhaler   Discharge Diagnoses:  Principal Problem:   Chest pain Active Problems:   COPD (chronic obstructive pulmonary  disease) (HCC)   GERD (gastroesophageal reflux disease)   Chest pressure    Discharge Instructions     Medication List    STOP taking these medications   fluticasone 50 MCG/ACT nasal spray Commonly known as:  FLONASE   FLUTTER Devi   levofloxacin 750 MG tablet Commonly known as:  LEVAQUIN   predniSONE 20 MG tablet Commonly known as:  DELTASONE     TAKE these medications   albuterol 108 (90 Base) MCG/ACT inhaler Commonly known as:  PROVENTIL HFA;VENTOLIN HFA Inhale 2 puffs into the lungs every 6 (six) hours as needed for wheezing or shortness of breath.   budesonide-formoterol 160-4.5 MCG/ACT inhaler Commonly known as:  SYMBICORT Inhale 2 puffs into the lungs 2 (two) times daily as needed (for shortness of breath).   cetirizine 10 MG tablet Commonly known as:  ZYRTEC Take 1 tablet (10 mg total) by mouth daily as needed for allergies.   dicyclomine 20 MG tablet Commonly known as:  BENTYL Take 20 mg by mouth every 6 (six) hours as needed for spasms.   ibuprofen 600 MG tablet Commonly known as:  ADVIL,MOTRIN Take 1 tablet (600 mg total) by mouth every 6 (six) hours as needed for moderate pain.   omeprazole 40 MG capsule Commonly known as:  PRILOSEC Take 40 mg by mouth daily as needed (for acid reflux).   ranitidine 150 MG tablet Commonly known as:  ZANTAC Take 1 tablet (150 mg total) by mouth 2 (two) times daily.   RESTASIS 0.05 % ophthalmic emulsion Generic drug:  cycloSPORINE INSTILL 1 DROP IN EACH EYE TWICE DAILY.       No Known Allergies  Consultations:  Cardiology    Procedures/Studies: Dg Chest 2 View  Result Date: 11/05/2015 CLINICAL DATA:  Shortness of  breath and palpitations EXAM: CHEST  2 VIEW COMPARISON:  Chest radiograph 10/28/2015 and CT chest 07/16/2015 FINDINGS: Lungs are hyperinflated. There is suture material seen within the left upper lobe. There is no focal airspace consolidation or pulmonary edema. No pneumothorax or pleural  effusion. Cardiomediastinal contours are normal. IMPRESSION: No focal airspace disease. Electronically Signed   By: Ulyses Jarred M.D.   On: 11/05/2015 22:35   Dg Chest 2 View  Result Date: 10/29/2015 CLINICAL DATA:  Shortness of breath with cough and congestion for 2 weeks. History of bronchiectasis. EXAM: CHEST  2 VIEW COMPARISON:  Chest CTA 07/16/2015 FINDINGS: Scarring at the right lung apex. Surgical changes in the left upper lung. Lungs are clear without airspace disease or pulmonary edema. No pleural effusions. Heart and mediastinum are within normal limits. Trachea is midline. No acute bone abnormality. IMPRESSION: No active cardiopulmonary disease. Electronically Signed   By: Markus Daft M.D.   On: 10/29/2015 08:05        Subjective: Patient feeling well, no nausea or vomiting. No further chest pain or palpitations.   Discharge Exam: Vitals:   11/07/15 0426 11/07/15 1227  BP: 114/80 125/73  Pulse: 82 92  Resp: 17 20  Temp: 98.2 F (36.8 C) 98.4 F (36.9 C)   Vitals:   11/06/15 2353 11/07/15 0426 11/07/15 0859 11/07/15 1227  BP: 116/75 114/80  125/73  Pulse: 96 82  92  Resp: 18 17  20   Temp: 98.2 F (36.8 C) 98.2 F (36.8 C)  98.4 F (36.9 C)  TempSrc: Oral Oral  Oral  SpO2: 100% 100% 99% 99%  Weight:      Height:        General: Pt is alert, awake, not in acute distress Cardiovascular: RRR, S1/S2 +, no rubs, no gallops Respiratory: CTA bilaterally, no wheezing, no rhonchi Abdominal: Soft, NT, ND, bowel sounds + Extremities: no edema, no cyanosis    The results of significant diagnostics from this hospitalization (including imaging, microbiology, ancillary and laboratory) are listed below for reference.     Microbiology: No results found for this or any previous visit (from the past 240 hour(s)).   Labs: BNP (last 3 results) No results for input(s): BNP in the last 8760 hours. Basic Metabolic Panel:  Recent Labs Lab 11/05/15 2149  NA 135  K 4.1   CL 103  CO2 23  GLUCOSE 106*  BUN 13  CREATININE 0.83  CALCIUM 9.5   Liver Function Tests: No results for input(s): AST, ALT, ALKPHOS, BILITOT, PROT, ALBUMIN in the last 168 hours. No results for input(s): LIPASE, AMYLASE in the last 168 hours. No results for input(s): AMMONIA in the last 168 hours. CBC:  Recent Labs Lab 11/05/15 2149  WBC 11.3*  NEUTROABS 8.8*  HGB 13.8  HCT 40.3  MCV 92.0  PLT 356   Cardiac Enzymes:  Recent Labs Lab 11/05/15 2149 11/06/15 0114 11/06/15 0410 11/06/15 0654  TROPONINI 0.04* 0.14* 0.04* <0.03   BNP: Invalid input(s): POCBNP CBG: No results for input(s): GLUCAP in the last 168 hours. D-Dimer  Recent Labs  11/05/15 2149  DDIMER <0.27   Hgb A1c No results for input(s): HGBA1C in the last 72 hours. Lipid Profile No results for input(s): CHOL, HDL, LDLCALC, TRIG, CHOLHDL, LDLDIRECT in the last 72 hours. Thyroid function studies  Recent Labs  11/06/15 0114  TSH 0.740   Anemia work up No results for input(s): VITAMINB12, FOLATE, FERRITIN, TIBC, IRON, RETICCTPCT in the last 72 hours. Urinalysis  Component Value Date/Time   COLORURINE YELLOW 05/09/2015 0025   APPEARANCEUR CLEAR 05/09/2015 0025   LABSPEC 1.013 05/09/2015 0025   PHURINE 6.0 05/09/2015 0025   GLUCOSEU NEGATIVE 05/09/2015 0025   HGBUR NEGATIVE 05/09/2015 0025   BILIRUBINUR NEGATIVE 05/09/2015 0025   KETONESUR NEGATIVE 05/09/2015 0025   PROTEINUR NEGATIVE 05/09/2015 0025   NITRITE NEGATIVE 05/09/2015 0025   LEUKOCYTESUR TRACE (A) 05/09/2015 0025   Sepsis Labs Invalid input(s): PROCALCITONIN,  WBC,  LACTICIDVEN Microbiology No results found for this or any previous visit (from the past 240 hour(s)).   Time coordinating discharge: 45 minutes  SIGNED:   Tawni Millers, MD  [facility] 11/07/2015, 1:13 PM Pager   If 7PM-7AM, please contact night-coverage [email or web address] Password [email or web address]

## 2015-11-07 NOTE — Progress Notes (Signed)
  Echocardiogram 2D Echocardiogram has been performed.  Darlina Sicilian M 11/07/2015, 10:52 AM

## 2015-11-07 NOTE — Progress Notes (Signed)
Patient alert and oriented denies pain, no shortness of breath. D/c instruction explain and given to the patient, all question answer, iv and tele d/c. Pt. D/c per order.

## 2015-11-12 ENCOUNTER — Encounter: Payer: Self-pay | Admitting: Acute Care

## 2015-11-12 ENCOUNTER — Ambulatory Visit (INDEPENDENT_AMBULATORY_CARE_PROVIDER_SITE_OTHER): Payer: Medicare Other | Admitting: Acute Care

## 2015-11-12 DIAGNOSIS — J479 Bronchiectasis, uncomplicated: Secondary | ICD-10-CM

## 2015-11-12 MED ORDER — LEVALBUTEROL TARTRATE 45 MCG/ACT IN AERO: 1.0000 | INHALATION_SPRAY | Freq: Four times a day (QID) | RESPIRATORY_TRACT | 6 refills | Status: DC | PRN

## 2015-11-12 NOTE — Patient Instructions (Addendum)
It is  Good to see you today. Follow up with Cardiology as scheduled tomorrow  Continue using your flutter valve twice daily. Mucinex 600 mg twice daily for chest congestion. Continue your Zyrtec and Flonase Continue your Symbicort 2 puffs twice daily. Remember to rinse your mouth after use. Use your rescue inhaler as needed up to every 6 hours for shortness of breath or wheezing. Add Zantac 150 mg every night 1 hour before bed.  We will prescribe a Xopenex rescue inhaler to use instead of your . Check your cost with your insurance.   Follow up in 2 months with me.                                                                                                                                                            Please contact office for sooner follow up if symptoms do not improve or worsen or seek emergency care

## 2015-11-12 NOTE — Progress Notes (Signed)
Cardiology Office Note   Date:  11/13/2015   ID:  Hannah Barron, DOB Feb 11, 1970, MRN [id number]  PCP:  Mauricio Po, FNP  Cardiologist:  Dr. Martinique    Chief Complaint  Patient presents with  . Hospitalization Follow-up    for syncope and tachycardia and HTN      History of Present Illness: Hannah Barron is a 45 y.o. female who presents for post hospitalization for chest palpitations. She has episodes of Sinus tach which are associated with diaphoresis, palpitations and chest pain- this was witnessed in the hospital. Admitted after BP elevated to [id number] systolic and rapid HR and at [facility] she passed out and EMS called. In EMS HR from 90 to 120-130s.   Plans are for outpt monitor. Echo EF 65-70%, no RWMA.  Her EKG showed NSR, with no acute ST or T wave changes. Troponin was initially 0.04, then 0.14, last was 0.04.   She has a history of COPD and recent bronchiectasis.    Today she reports an episode of rapid HR and her husband felt she did not respond well until he touched her and she was alert.    She is anxious about her symptoms. Reassured both pt and her husband that this is not due to CAD and that her Echo was fairly normal.  Her thyroid studies were normal as well.   Negative DDimer.    Past Medical History:  Diagnosis Date  . Bronchitis   . COPD (chronic obstructive pulmonary disease) ([place])   . Migraines     Past Surgical History:  Procedure Laterality Date  . CESAREAN SECTION    . LUNG BIOPSY       Current Outpatient Prescriptions  Medication Sig Dispense Refill  . albuterol (PROVENTIL HFA;VENTOLIN HFA) 108 (90 Base) MCG/ACT inhaler Inhale 2 puffs into the lungs every 6 (six) hours as needed for wheezing or shortness of breath. 1 Inhaler 6  . budesonide-formoterol (SYMBICORT) 160-4.5 MCG/ACT inhaler Inhale 2 puffs into the lungs 2 (two) times daily.    . cetirizine (ZYRTEC) 10 MG tablet Take 1 tablet (10 mg total) by mouth daily as needed for allergies. 10 tablet 0    . dicyclomine (BENTYL) 20 MG tablet Take 20 mg by mouth every 6 (six) hours as needed for spasms.    Marland Kitchen ibuprofen (ADVIL,MOTRIN) 600 MG tablet Take 1 tablet (600 mg total) by mouth every 6 (six) hours as needed for moderate pain. 30 tablet 1  . levalbuterol (XOPENEX HFA) 45 MCG/ACT inhaler Inhale 1-2 puffs into the lungs every 6 (six) hours as needed for wheezing. 1 Inhaler 6  . omeprazole (PRILOSEC) 40 MG capsule Take 40 mg by mouth daily as needed (for acid reflux).     . ranitidine (ZANTAC) 150 MG tablet Take 1 tablet (150 mg total) by mouth 2 (two) times daily. 30 tablet 5  . RESTASIS 0.05 % ophthalmic emulsion INSTILL 1 DROP IN EACH EYE TWICE DAILY.  2   No current facility-administered medications for this visit.     Allergies:   Review of patient's allergies indicates no known allergies.    Social History:  The patient  reports that she has never smoked. She has never used smokeless tobacco. She reports that she does not drink alcohol or use drugs.   Family History:  The patient's family history includes Healthy in her father and mother.    ROS:  General:no colds or fevers, + weight increase Skin:no rashes or ulcers HEENT:no blurred vision, no congestion  CV:see HPI PUL:see HPI GI:no diarrhea constipation or melena, no indigestion GU:no hematuria, no dysuria MS:no joint pain, no claudication Neuro:+ syncope, no lightheadedness Endo:no diabetes, no thyroid disease  Wt Readings from Last 3 Encounters:  11/13/15 171 lb 6.4 oz (77.7 kg)  11/12/15 170 lb 12.8 oz (77.5 kg)  11/06/15 166 lb 3.2 oz (75.4 kg)     PHYSICAL EXAM: VS:  BP 124/74 (BP Location: Left Arm)   Pulse 92   Ht 5\' 7"  (1.702 m)   Wt 171 lb 6.4 oz (77.7 kg)   LMP 10/27/2015   BMI 26.85 kg/m  , BMI Body mass index is 26.85 kg/m. General:Pleasant affect, NAD Skin:Warm and dry, brisk capillary refill HEENT:normocephalic, sclera clear, mucus membranes moist Neck:supple, no JVD, no bruits  Heart:S1S2 RRR  without murmur, gallup, rub or click Lungs:clear without rales, rhonchi, or wheezes [id number], non tender, + BS, do not palpate liver spleen or masses Ext:no lower ext edema, 2+ pedal pulses, 2+ radial pulses Neuro:alert and oriented X 3, MAE, follows commands, + facial symmetry    EKG:  EKG is NOT ordered today. Reviewed EKGs from the hospital and no acute process.   Recent Labs: 05/08/2015: ALT 40 11/05/2015: BUN 13; Creatinine, Ser 0.83; Hemoglobin 13.8; Platelets 356; Potassium 4.1; Sodium 135 11/06/2015: TSH 0.740    Lipid Panel No results found for: CHOL, TRIG, HDL, CHOLHDL, VLDL, LDLCALC, LDLDIRECT     Other studies Reviewed: Additional studies/ records that were reviewed today include: . Echo 11/07/15 Study Conclusions  - Left ventricle: The cavity size was normal. There was mild focal   basal hypertrophy of the septum. Systolic function was vigorous.   The estimated ejection fraction was in the range of 65% to 70%.   Wall motion was normal; there were no regional wall motion   abnormalities. Left ventricular diastolic function parameters   were normal. - Aortic valve: Trileaflet; normal thickness, mildly calcified   leaflets.   ASSESSMENT AND PLAN:  1.  Tachycardia and recent syncope with HTN.  Will do 30 day event monitor to rule out SVT.  Only ST seen in the hospital.  2.  HTN and today stable BP, will do ETT to evaluate BP with exertion as well as HR.   She will follow up after a month with me or Dr. Martinique.     Current medicines are reviewed with the patient today.  The patient Has no concerns regarding medicines.  The following changes have been made:  See above Labs/ tests ordered today include:see above  Disposition:   FU:  see above  Signed, Cecilie Kicks, NP  11/13/2015 11:47 AM    [facility], [place], [place], [place] Phone: [phone]; Fax: [phone]

## 2015-11-12 NOTE — Assessment & Plan Note (Signed)
Resolved Flare after treatment with Levaquin and prednisone taper Plan Follow up with Cardiology as scheduled tomorrow  Continue using your flutter valve twice daily. Mucinex 600 mg twice daily for chest congestion. Continue your Zyrtec and Flonase Continue your Symbicort 2 puffs twice daily. Remember to rinse your mouth after use. Use your rescue inhaler as needed up to every 6 hours for shortness of breath or wheezing. Add Zantac 150 mg every night 1 hour before bed.  We will prescribe a Xopenex rescue inhaler to use instead of your . Check your cost with your insurance.   Follow up in 2 months with me.                                                                                                                                 Please contact office for sooner follow up if symptoms do not improve or worsen or seek emergency care

## 2015-11-12 NOTE — Progress Notes (Signed)
History of Present Illness Hannah Barron is a 45 y.o. female never smoker with Bronchitis; Migraines; and COPD. She is followed by Dr. Vaughan Browner.   11/12/2015 Follow UP OV: Pt. Presents to the office today for two-week follow up of her cough, bronchiectasis flare. She has completed her Levaquin and Prednisone taper. She states her breathing is better. Her cough is back to baseline . Secretions are minimal and clear. From a pulmonary standpoint she states she  feels good.She explained that she has been admitted to the hospital recently for chest pain. She was worked up for acute coronary syndrome on November 05, 2015 . She states she feels lightheaded and dizzy during these events, and feels her heart racing. Per the discharge note there is high suspicion that the patient is experiencing episodes of SVT, as cardiac markers were elevated on admission. She has an appointment tomorrow, 11/13/2015 with [facility] cardiology to evaluate for outpatient event monitoring. She states there is no pattern to the onset of these events. Today in the office she is pain-free, with a heart rate of 116.  She received her Flu shot and pneumonia vaccine at hospital with this admission. We did discuss whether or not her albuterol inhaler could be a causative factor in regard to her heart rate. She said that her inhaler does not affect her heart rate, and there is no pattern or trigger that she can note that causes it. She said it is very spontaneous. I will send her with a prescription for Xopenex in the event she feels she should change from her albuterol inhaler.  11/05/2015: CXR Lungs are hyperinflated. There is suture material seen within the left upper lobe. There is no focal airspace consolidation or pulmonary edema. No pneumothorax or pleural effusion. Cardiomediastinal contours are normal.    ECHO 11/07/2015 Study Conclusions  - Left ventricle: The cavity size was normal. There was mild focal   basal hypertrophy  of the septum. Systolic function was vigorous.   The estimated ejection fraction was in the range of 65% to 70%.   Wall motion was normal; there were no regional wall motion   abnormalities. Left ventricular diastolic function parameters   were normal. - Aortic valve: Trileaflet; normal thickness, mildly calcified   leaflets  Results for KECHA, LIDDIARD (MRN [id number]) as of 11/12/2015 11:30  Ref. Range 11/06/2015 01:14 11/06/2015 04:10 11/06/2015 06:54  Troponin I Latest Ref Range: <0.03 ng/mL 0.14 (HH) 0.04 (HH) <0.03    Past medical hx Past Medical History:  Diagnosis Date  . Bronchitis   . COPD (chronic obstructive pulmonary disease) ([place])   . Migraines      Past surgical hx, Family hx, Social hx all reviewed.  Current Outpatient Prescriptions on File Prior to Visit  Medication Sig  . albuterol (PROVENTIL HFA;VENTOLIN HFA) 108 (90 Base) MCG/ACT inhaler Inhale 2 puffs into the lungs every 6 (six) hours as needed for wheezing or shortness of breath.  . budesonide-formoterol (SYMBICORT) 160-4.5 MCG/ACT inhaler Inhale 2 puffs into the lungs 2 (two) times daily as needed (for shortness of breath).  . cetirizine (ZYRTEC) 10 MG tablet Take 1 tablet (10 mg total) by mouth daily as needed for allergies.  Marland Kitchen dicyclomine (BENTYL) 20 MG tablet Take 20 mg by mouth every 6 (six) hours as needed for spasms.  Marland Kitchen ibuprofen (ADVIL,MOTRIN) 600 MG tablet Take 1 tablet (600 mg total) by mouth every 6 (six) hours as needed for moderate pain.  Marland Kitchen omeprazole (PRILOSEC) 40 MG capsule Take 40 mg  by mouth daily as needed (for acid reflux).   . RESTASIS 0.05 % ophthalmic emulsion INSTILL 1 DROP IN EACH EYE TWICE DAILY.  . ranitidine (ZANTAC) 150 MG tablet Take 1 tablet (150 mg total) by mouth 2 (two) times daily. (Patient not taking: Reported on 11/12/2015)   No current facility-administered medications on file prior to visit.      No Known Allergies  Review Of Systems:  Constitutional:   No  weight loss,  night sweats,  Fevers, chills, fatigue, or  lassitude.  HEENT:   No headaches,  Difficulty swallowing,  Tooth/dental problems, or  Sore throat,                No sneezing, itching, ear ache, nasal congestion, post nasal drip,   CV:  No chest pain,  Orthopnea, PND, swelling in lower extremities, anasarca, dizziness, palpitations, syncope.   GI  No heartburn, indigestion, abdominal pain, nausea, vomiting, diarrhea, change in bowel habits, loss of appetite, bloody stools.   Resp: No shortness of breath with exertion or at rest.  No excess mucus, + productive cough ( Baseline),  No non-productive cough,  No coughing up of blood.  No change in color of mucus.  No wheezing.  No chest wall deformity  Skin: no rash or lesions.  GU: no dysuria, change in color of urine, no urgency or frequency.  No flank pain, no hematuria   MS:  No joint pain or swelling.  No decreased range of motion.  No back pain.  Psych:  No change in mood or affect. No depression + anxiety.  No memory loss.   Vital Signs BP 118/72 (BP Location: Right Arm, Patient Position: Sitting, Cuff Size: Normal)   Pulse (!) 114   Ht 5\' 7"  (1.702 m)   Wt 170 lb 12.8 oz (77.5 kg)   LMP 10/27/2015   SpO2 99%   BMI 26.75 kg/m    Physical Exam:  General- No distress,  A&Ox3, pleasant, slightly anxious ENT: No sinus tenderness, TM clear, pale nasal mucosa, no oral exudate,no post nasal drip, no LAN Cardiac: S1, S2, regular rate and rhythm, no murmur Chest: No wheeze/ rales/ dullness; no accessory muscle use, no nasal flaring, no sternal retractions Abd.: Soft Non-tender Ext: No clubbing cyanosis, edema Neuro:  normal strength Skin: No rashes, warm and dry Psych: normal mood and behavior   Assessment/Plan  Bronchiectasis without acute exacerbation (HCC) Resolved Flare after treatment with Levaquin and prednisone taper Plan Follow up with Cardiology as scheduled tomorrow  Continue using your flutter valve twice  daily. Mucinex 600 mg twice daily for chest congestion. Continue your Zyrtec and Flonase Continue your Symbicort 2 puffs twice daily. Remember to rinse your mouth after use. Use your rescue inhaler as needed up to every 6 hours for shortness of breath or wheezing. Add Zantac 150 mg every night 1 hour before bed.  We will prescribe a Xopenex rescue inhaler to use instead of your . Check your cost with your insurance.   Follow up in 2 months with me.  Please contact office for sooner follow up if symptoms do not improve or worsen or seek emergency care        Magdalen Spatz, NP 11/12/2015  1:11 PM

## 2015-11-13 ENCOUNTER — Encounter: Payer: Self-pay | Admitting: Cardiology

## 2015-11-13 ENCOUNTER — Ambulatory Visit (INDEPENDENT_AMBULATORY_CARE_PROVIDER_SITE_OTHER): Payer: Medicare Other | Admitting: Cardiology

## 2015-11-13 ENCOUNTER — Encounter (INDEPENDENT_AMBULATORY_CARE_PROVIDER_SITE_OTHER): Payer: Self-pay

## 2015-11-13 VITALS — BP 124/74 | HR 92 | Ht 67.0 in | Wt 171.4 lb

## 2015-11-13 DIAGNOSIS — R55 Syncope and collapse: Secondary | ICD-10-CM | POA: Diagnosis not present

## 2015-11-13 DIAGNOSIS — R002 Palpitations: Secondary | ICD-10-CM

## 2015-11-13 DIAGNOSIS — R000 Tachycardia, unspecified: Secondary | ICD-10-CM | POA: Diagnosis not present

## 2015-11-13 NOTE — Patient Instructions (Signed)
Schedule 30 day Event Monitor   Schedule Plain Treadmill    Your physician recommends that you schedule a follow-up appointment with Cecilie Kicks NP after 30 day monitor.

## 2015-11-19 ENCOUNTER — Ambulatory Visit (INDEPENDENT_AMBULATORY_CARE_PROVIDER_SITE_OTHER): Payer: Medicare Other

## 2015-11-19 DIAGNOSIS — R000 Tachycardia, unspecified: Secondary | ICD-10-CM | POA: Diagnosis not present

## 2015-11-19 DIAGNOSIS — R55 Syncope and collapse: Secondary | ICD-10-CM

## 2015-11-19 DIAGNOSIS — R002 Palpitations: Secondary | ICD-10-CM

## 2015-11-27 ENCOUNTER — Encounter: Payer: Self-pay | Admitting: Family

## 2015-11-27 ENCOUNTER — Ambulatory Visit (INDEPENDENT_AMBULATORY_CARE_PROVIDER_SITE_OTHER): Payer: Medicare Other | Admitting: Family

## 2015-11-27 VITALS — BP 128/82 | HR 113 | Temp 98.4°F | Resp 16 | Ht 67.0 in | Wt 171.0 lb

## 2015-11-27 DIAGNOSIS — R002 Palpitations: Secondary | ICD-10-CM

## 2015-11-27 NOTE — Assessment & Plan Note (Signed)
Symptoms and exam remain consistent palpitations with concern for possible episodes of supraventricular tachycardia given the secondary symptoms of weakness, dizziness and shortness of breath on occasion. Continue with event monitoring per cardiology. Previous blood work reviewed with no evidence of significant abnormalities. Unlikely anxiety. Follow-up and additional treatment per cardiology.

## 2015-11-27 NOTE — Patient Instructions (Addendum)
Thank you for choosing [facility].  SUMMARY AND INSTRUCTIONS:  Medication:  Keep taking medications as prescribed.   Follow up:  If your symptoms worsen or fail to improve, please contact our office for further instruction, or in case of emergency go directly to the emergency room at the closest medical facility.     Supraventricular Tachycardia, Adult Supraventricular tachycardia (SVT) is a type of abnormal heart rhythm. It causes the heart to beat very quickly and then return to a normal speed. A normal heart rate is 60-100 beats per minute. During an episode of SVT, your heart rate may be higher than 150 beats per minute. Episodes of SVT can be frightening, but they are usually not dangerous. However, if episodes happens often or last for long periods of time, they may lead to heart failure. What are the causes? Usually, a normal heartbeat starts when an area called the sinoatrial node releases an electrical signal. In SVT, other areas of the heart send out electrical signals that interfere with the signal from the sinoatrial node. It is not known why some people get SVT and others do not. What increases the risk? This condition is more likely to develop in:  People who are 40?45 years old.  Women. Factors that may increase your chances of an attack include:  Stress.  Tiredness.  Smoking.  Stimulant drugs, such as cocaine and methamphetamine.  Alcohol.  Caffeine.  Pregnancy.  Anxiety. What are the signs or symptoms? Symptoms of this condition include:  A pounding heart.  A feeling that the heart is skipping beats (palpitations).  Weakness.  Shortness of breath.  Tightness or pain in your chest.  Light-headedness.  Anxiety.  Dizziness.  Sweating.  Nausea.  Fainting.  Fatigue or tiredness. A mild episode may not cause symptoms. How is this diagnosed? This condition may be diagnosed based on:  Your symptoms.  A physical exam. If you  are have an episode of SVT during the exam, the health care provider may be able to diagnose SVT by listening to your heart and feeling your pulse.  Tests. These may include:  An electrocardiogram (ECG). This test is done to check for problems with electrical activity in the heart.  A Holter monitor or event monitor test. This test involves wearing a portable device that monitors your heart rate over time.  An echocardiogram. This test involves taking an image of your heart using sound waves. It is done to rule out other causes of a fast heart rate.  Blood tests. How is this treated? This condition may be treated with:  Vagal nerve stimulation. The treatment involves stimulating your vagus nerve, which slows down the heart. It is often the first and only treatment that is needed for this condition. It is a good idea to try the several ways of doing vagal stimulation to find which one works best for you. Ways to do this treatment include:  Holding your breath and pushing, as though you are having a bowel movement.  Massaging an area on one side of your neck, below your jaw. Do not try this yourself. Only a health care provider should do this. If done the wrong way, it can lead to a stroke.  Bending forward with your head between your legs.  Coughing while bending forward with your head between your legs.  Closing your eyes and massaging your eyeballs. A health care provider should guide you through this method before you try it on your own.  Medicines that  prevent attacks.  Medicine to stop an attack. The medicine is given through an IV tube at the hospital.  A small electric shock (cardioversion) that stops an attack. Before you get the shock, you will get medicine to make you fall asleep.  Radiofrequency ablation. In this procedure, a small, thin tube (catheter) is used to send radiofrequency energy to the area of tissue that is causing the rapid heartbeats. The energy kills the  cells and helps your heart keep a normal rhythm. You may have this treatment if you have symptoms of SVT often. If you do not have symptoms, you may not need treatment. Follow these instructions at home: Stress  Avoid stressful situations when possible.  Find healthy ways of managing stress that work for you. Some healthy ways to manage stress include:  Taking part in relaxing activities, such as yoga, meditation, or being out in nature.  Listening to relaxing music.  Practicing relaxation techniques, such as deep breathing.  Leading a healthy lifestyle. This involves getting plenty of sleep, exercising, and eating a balanced diet.  Attending counseling or talk therapy with a mental health professional. Sleep  Try to get at least 7 hours of sleep each night. Tobacco and nicotine  Do not use any products that contain nicotine or tobacco, such as cigarettes and e-cigarettes. If you need help quitting, ask your health care provider. Alcohol  If alcohol triggers episodes of SVT, do not drink alcohol.  If alcohol does not seem to trigger episodes, limit alcohol intake to no more than 1 drink a day for nonpregnant women and 2 drinks a day for men. One drink equals 12 oz of beer, 5 oz of wine, or 1 oz of hard liquor. Caffeine  If caffeine triggers episodes of SVT, do not eat, drink, or use anything with caffeine in it.  If caffeine does not seem to trigger episodes, consume caffeine in moderation. Stimulant drugs  Do not use stimulant drugs. If you need help quitting, talk with your health care provider. General instructions  Maintain a healthy weight.  Exercise regularly. Ask your health care provider to suggest some good activities for you. Aim for one or a combination of the following:  150 minutes per week of moderate exercise, such as walking or yoga.  75 minutes per week of vigorous exercise, such as running or swimming.  Perform vagus nerve stimulation as directed by  your health care provider.  Take over-the-counter and prescription medicines only as told by your health care provider. Contact a health care provider if:  You have episodes of SVT more often than before.  Episodes of SVT last longer than before.  Vagus nerve stimulation is no longer helping.  You have new symptoms. Get help right away if:  You have chest pain.  Your symptoms get worse.  You have trouble breathing.  You have an episode of SVT that lasts longer than 20 minutes.  You faint. These symptoms may represent a serious problem that is an emergency. Do not wait to see if the symptoms will go away. Get medical help right away. Call your local emergency services (911 in the [place] Do not drive yourself to the hospital.  This information is not intended to replace advice given to you by your health care provider. Make sure you discuss any questions you have with your health care provider. Document Released: 12/27/2004 Document Revised: 09/03/2015 Document Reviewed: 09/03/2015 Elsevier Interactive Patient Education  2017 [employer].

## 2015-11-27 NOTE — Progress Notes (Signed)
Subjective:    Patient ID: Hannah Barron, female    DOB: 16-Dec-1970, 45 y.o.   MRN: [id number]  Chief Complaint  Patient presents with  . Hospitalization Follow-up    wants to talk about her heart    HPI:  Lanina Marcon is a 45 y.o. female who  has a past medical history of Bronchitis; COPD (chronic obstructive pulmonary disease) ([place]); and Migraines. and presents today for   Palpitations/syncope -this is a new problem. Recently evaluated in the emergency department for heart palpitations and the associated symptoms of chest pain, chest pressure, and shortness of breath. Physical exam positive for tachycardia. Symptoms are generally worse with exertion. D-dimer was negative. ACS was unlikely, however troponin was positive with question for demand ischemia from tachyarrhythmia. She remained in sinus rhythm while on telemetry. Cardiology recommended to outpatient event monitoring with concern for episodes of supraventricular tachycardia given elevated cardiac markers. Echocardiogram performed was normal with good ejection fraction and normal left ventricular function. All hospital records, imaging, and labs were reviewed in detail.   Since leaving the hospital she reports that she continues to have episodes of chest pain that is described as tightness that generally lasts for about 10-15 minutes and is relieved by rest that occur sporadically. Timing of the symptoms generally occur at night. Currently wearing her event monitor for 30 days and being followed by cardiology for possibility of SVT. Not currently experiencing symptoms.  No Known Allergies    Outpatient Medications Prior to Visit  Medication Sig Dispense Refill  . albuterol (PROVENTIL HFA;VENTOLIN HFA) 108 (90 Base) MCG/ACT inhaler Inhale 2 puffs into the lungs every 6 (six) hours as needed for wheezing or shortness of breath. 1 Inhaler 6  . budesonide-formoterol (SYMBICORT) 160-4.5 MCG/ACT inhaler Inhale 2 puffs into the lungs 2  (two) times daily.    . cetirizine (ZYRTEC) 10 MG tablet Take 1 tablet (10 mg total) by mouth daily as needed for allergies. 10 tablet 0  . dicyclomine (BENTYL) 20 MG tablet Take 20 mg by mouth every 6 (six) hours as needed for spasms.    Marland Kitchen ibuprofen (ADVIL,MOTRIN) 600 MG tablet Take 1 tablet (600 mg total) by mouth every 6 (six) hours as needed for moderate pain. 30 tablet 1  . levalbuterol (XOPENEX HFA) 45 MCG/ACT inhaler Inhale 1-2 puffs into the lungs every 6 (six) hours as needed for wheezing. 1 Inhaler 6  . omeprazole (PRILOSEC) 40 MG capsule Take 40 mg by mouth daily as needed (for acid reflux).     . ranitidine (ZANTAC) 150 MG tablet Take 1 tablet (150 mg total) by mouth 2 (two) times daily. 30 tablet 5  . RESTASIS 0.05 % ophthalmic emulsion INSTILL 1 DROP IN EACH EYE TWICE DAILY.  2   No facility-administered medications prior to visit.      Past Medical History:  Diagnosis Date  . Bronchitis   . COPD (chronic obstructive pulmonary disease) ([place])   . Migraines     Review of Systems  Constitutional: Negative for chills and fever.  Eyes:       Negative for changes in vision  Respiratory: Negative for cough, chest tightness and wheezing.   Cardiovascular: Negative for chest pain, palpitations and leg swelling.  Neurological: Negative for dizziness, weakness and light-headedness.      Objective:    BP 128/82 (BP Location: Left Arm, Patient Position: Sitting, Cuff Size: Normal)   Pulse (!) 113   Temp 98.4 F (36.9 C) (Oral)   Resp 16  Ht 5\' 7"  (1.702 m)   Wt 171 lb (77.6 kg)   LMP 10/27/2015   SpO2 97%   BMI 26.78 kg/m  Nursing note and vital signs reviewed.  Physical Exam  Constitutional: She is oriented to person, place, and time. She appears well-developed and well-nourished. No distress.  Cardiovascular: Regular rhythm, normal heart sounds and intact distal pulses.   No extrasystoles are present. Tachycardia present.  Exam reveals no gallop and no friction rub.    No murmur heard. Pulmonary/Chest: Effort normal and breath sounds normal.  Neurological: She is alert and oriented to person, place, and time.  Skin: Skin is warm and dry. She is not diaphoretic.  Psychiatric: She has a normal mood and affect. Her behavior is normal. Judgment and thought content normal.       Assessment & Plan:   Problem List Items Addressed This Visit      Other   Palpitations - Primary    Symptoms and exam remain consistent palpitations with concern for possible episodes of supraventricular tachycardia given the secondary symptoms of weakness, dizziness and shortness of breath on occasion. Continue with event monitoring per cardiology. Previous blood work reviewed with no evidence of significant abnormalities. Unlikely anxiety. Follow-up and additional treatment per cardiology.         I am having Ms. Thornley maintain her omeprazole, ibuprofen, RESTASIS, albuterol, ranitidine, dicyclomine, cetirizine, levalbuterol, and budesonide-formoterol.   Follow-up: Return if symptoms worsen or fail to improve.  Mauricio Po, FNP

## 2015-12-02 ENCOUNTER — Ambulatory Visit (INDEPENDENT_AMBULATORY_CARE_PROVIDER_SITE_OTHER): Payer: Medicare Other

## 2015-12-02 DIAGNOSIS — R002 Palpitations: Secondary | ICD-10-CM

## 2015-12-02 DIAGNOSIS — R55 Syncope and collapse: Secondary | ICD-10-CM

## 2015-12-02 DIAGNOSIS — R000 Tachycardia, unspecified: Secondary | ICD-10-CM | POA: Diagnosis not present

## 2015-12-02 LAB — EXERCISE TOLERANCE TEST
CHL CUP MPHR: 175 {beats}/min
CHL CUP STRESS STAGE 1 GRADE: 0 %
CHL CUP STRESS STAGE 1 SBP: 99 mmHg
CHL CUP STRESS STAGE 1 SPEED: 0 mph
CHL CUP STRESS STAGE 2 GRADE: 0 %
CHL CUP STRESS STAGE 2 HR: 100 {beats}/min
CHL CUP STRESS STAGE 3 GRADE: 0 %
CHL CUP STRESS STAGE 3 HR: 100 {beats}/min
CHL CUP STRESS STAGE 5 SPEED: 2.5 mph
CHL CUP STRESS STAGE 6 DBP: 54 mmHg
CHL CUP STRESS STAGE 6 GRADE: 0 %
CHL CUP STRESS STAGE 6 SBP: 155 mmHg
CHL CUP STRESS STAGE 6 SPEED: 0 mph
CHL CUP STRESS STAGE 7 DBP: 68 mmHg
CHL CUP STRESS STAGE 7 GRADE: 0 %
CHL CUP STRESS STAGE 7 SBP: 129 mmHg
CSEPPHR: 157 {beats}/min
Estimated workload: 6.4 METS
Exercise duration (min): 4 min
Exercise duration (sec): 30 s
Percent HR: 91 %
Percent of predicted max HR: 89 %
RPE: 17
Rest HR: 87 {beats}/min
Stage 1 DBP: 75 mmHg
Stage 1 HR: 96 {beats}/min
Stage 2 Speed: 1 mph
Stage 3 Speed: 1 mph
Stage 4 DBP: 73 mmHg
Stage 4 Grade: 10 %
Stage 4 HR: 137 {beats}/min
Stage 4 SBP: 183 mmHg
Stage 4 Speed: 1.7 mph
Stage 5 Grade: 12 %
Stage 5 HR: 157 {beats}/min
Stage 6 HR: 120 {beats}/min
Stage 7 HR: 93 {beats}/min
Stage 7 Speed: 0 mph

## 2015-12-08 ENCOUNTER — Telehealth: Payer: Self-pay | Admitting: Cardiology

## 2015-12-08 ENCOUNTER — Telehealth: Payer: Self-pay

## 2015-12-08 DIAGNOSIS — R0789 Other chest pain: Secondary | ICD-10-CM

## 2015-12-08 NOTE — Telephone Encounter (Signed)
Returned call to patient.[name] PA advised treadmill a little abnormal.She spoke to [place] he advised needs a stress myoview.Scheduler will call back to schedule.She stated she is wearing a monitor,to be return 12/19/15.Stated she would like a sooner appointment.Appointment rescheduled with [name] PA 12/28/15 at 2:30 pm at [facility] office.

## 2015-12-08 NOTE — Telephone Encounter (Signed)
Called patient no answer.Left message on personal voice mail to call me back about treadmill results.

## 2015-12-08 NOTE — Telephone Encounter (Signed)
Returning your call. °

## 2015-12-10 ENCOUNTER — Telehealth: Payer: Self-pay | Admitting: *Deleted

## 2015-12-10 NOTE — Telephone Encounter (Signed)
Received alert notification for monitor report. Hannah Barron from [facility] informed me of SVT finding, patient reported, w peak rate of 171.  Monitoring service called patient and left msg. They are faxing a report to our office. I have also called patient - no answer when dialed, left msg to call office.  Will review w [facility] - awaiting call from patient and event strips.

## 2015-12-10 NOTE — Telephone Encounter (Signed)
Discussed w Dr. Percival Spanish. As pt's SVT resolved, she is not currently symptomatic, he recommended APP follow up <72 hrs, consider EP evaluation/physician reassignment (she is new patient and has not seen Dr. Martinique yet). She had been set up to return on 12/18 for monitor findings w Suanne Marker. I have scheduled her w [place] for Monday 11am appt.  Today is day 22 of 30 for monitoring.  I will have event strip copy on-hand for PA review. Routed to [place] as FYI

## 2015-12-10 NOTE — Telephone Encounter (Signed)
Pt is returning [employer]

## 2015-12-10 NOTE — Telephone Encounter (Signed)
I spoke to patient. She reports she is "okay right now", no symptoms, reports no chest tightness, shortness of breath, fatigue, etc. She did state she was "a little dizzy" when her rapid rate occurred earlier this AM.   I also just received strips - fax machine had been out of paper.  SVT at peak of 171 as previously noted. Event happened approx 9:20am per [employer] report. Will review w [facility].

## 2015-12-13 NOTE — Progress Notes (Signed)
Cardiology Office Note    Date:  12/14/2015   ID:  Hannah Barron, DOB 04-Feb-1970, MRN [id number]  PCP:  Mauricio Po, FNP  Cardiologist: Dr. Martinique  Chief Complaint  Patient presents with  . Follow-up  . Palpitations    frequently.  . Dizziness    frequently.    History of Present Illness:    Hannah Barron is a 45 y.o. female with past medical history of Bronchiectasis and migraine headaches who presents to the office today for follow-up of palpitations.  She was recently admitted from 10/26 - 11/07/2015 at [facility] for evaluation of palpitations and chest pain. Cyclic troponin values were minimally elevated at 0.04, 0.14, and 0.04 with her EKG showing no acute ST or T-wave changes. Telemetry was reviewed which showed episodes of sinus tachycardia and this was thought to concur with her symptoms. TSH WNL. Echocardiogram was obtained which showed an EF of 65-70% with no regional WMA.   Was seen in the office by Cecilie Kicks, NP on 11/13/2015 and a 30-day event monitor was arranged to evaluate for tachyarrhythmias. An exercise treadmill stress test was performed on 11/22 and showed 1-2 mm of ST depression in the anterior and lateral leads. With this being abnormal, a Lexiscan Myoview was recommended for further ischemic evaluation and is scheduled for 12/18/2015.    On 12/10/2015, [employer] called reporting the patient had an episode of SVT with peak HR of 171. Reported symptoms at that time were palpitations and dizziness. This was reviewed with the DOD (Dr. Percival Spanish) who recommended she be seen for evaluation today.  She reports having frequent episodes of palpitations and dizziness for the past month. Feels like she "spaces out" with these but denies any LOC or seizure-like activity. Says this occurs 2-3 times per week and the episodes last for seconds to several minutes at a time. She does have occasional chest pressure when her heart feels "like it is beating out of her  chest". Notices these episodes occurring more frequently when she is stressed and angry. She then develops the palpitations and becomes anxious about her symptoms. Denies any symptoms at the time of today's encounter.  She has never tried any medications or maneuvers for this. Says her first episode was in 10/2015 when she went to the hospital for evaluation. Denies any known history of cardiac arrhythmias.    Past Medical History:  Diagnosis Date  . Bronchiectasis ([place])   . COPD (chronic obstructive pulmonary disease) ([place])   . Migraines   . Paroxysmal SVT (supraventricular tachycardia) ([place])    a. diagnosed in 11/2015.    Past Surgical History:  Procedure Laterality Date  . CESAREAN SECTION    . LUNG BIOPSY      Current Medications: Outpatient Medications Prior to Visit  Medication Sig Dispense Refill  . albuterol (PROVENTIL HFA;VENTOLIN HFA) 108 (90 Base) MCG/ACT inhaler Inhale 2 puffs into the lungs every 6 (six) hours as needed for wheezing or shortness of breath. 1 Inhaler 6  . budesonide-formoterol (SYMBICORT) 160-4.5 MCG/ACT inhaler Inhale 2 puffs into the lungs 2 (two) times daily.    . cetirizine (ZYRTEC) 10 MG tablet Take 1 tablet (10 mg total) by mouth daily as needed for allergies. 10 tablet 0  . dicyclomine (BENTYL) 20 MG tablet Take 20 mg by mouth every 6 (six) hours as needed for spasms.    Marland Kitchen ibuprofen (ADVIL,MOTRIN) 600 MG tablet Take 1 tablet (600 mg total) by mouth every 6 (six) hours as needed for  moderate pain. 30 tablet 1  . levalbuterol (XOPENEX HFA) 45 MCG/ACT inhaler Inhale 1-2 puffs into the lungs every 6 (six) hours as needed for wheezing. 1 Inhaler 6  . omeprazole (PRILOSEC) 40 MG capsule Take 40 mg by mouth daily as needed (for acid reflux).     . ranitidine (ZANTAC) 150 MG tablet Take 1 tablet (150 mg total) by mouth 2 (two) times daily. 30 tablet 5  . RESTASIS 0.05 % ophthalmic emulsion INSTILL 1 DROP IN EACH EYE TWICE DAILY.  2   No  facility-administered medications prior to visit.      Allergies:   Patient has no known allergies.   Social History   Social History  . Marital status: Married    Spouse name: N/A  . Number of children: 1  . Years of education: 22   Occupational History  . disability    Social History Main Topics  . Smoking status: Never Smoker  . Smokeless tobacco: Never Used  . Alcohol use No  . Drug use: No  . Sexual activity: Yes   Other Topics Concern  . None   Social History Narrative   Fun: Watch TV     Family History:  The patient's family history includes Healthy in her father and mother. No family history of CAD or cardiac arrhythmias.   Review of Systems:   Please see the history of present illness.     General:  No chills, fever, night sweats or weight changes.  Cardiovascular:  No chest pain, dyspnea on exertion, edema, orthopnea,  paroxysmal nocturnal dyspnea. Positive for palpitations.  Dermatological: No rash, lesions/masses Respiratory: No cough, dyspnea Urologic: No hematuria, dysuria Abdominal:   No nausea, vomiting, diarrhea, bright red blood per rectum, melena, or hematemesis Neurologic:  No visual changes, wkns, changes in mental status. Positive for dizziness.  All other systems reviewed and are otherwise negative except as noted above.   Physical Exam:    VS:  BP 113/62   Pulse 92   Ht 5\' 7"  (1.702 m)   Wt 173 lb 6.4 oz (78.7 kg)   BMI 27.16 kg/m    General: Well developed, well nourished, female appearing in no acute distress. Head: Normocephalic, atraumatic, sclera non-icteric, no xanthomas, nares are without discharge.  Neck: No carotid bruits. JVD not elevated.  Lungs: Respirations regular and unlabored, without wheezes or rales.  Heart: Regular rate and rhythm. No S3 or S4.  No murmur, no rubs, or gallops appreciated. Abdomen: Soft, non-tender, non-distended with normoactive bowel sounds. No hepatomegaly. No rebound/guarding. No obvious abdominal  masses. Msk:  Strength and tone appear normal for age. No joint deformities or effusions. Extremities: No clubbing or cyanosis. No edema.  Distal pedal pulses are 2+ bilaterally. Neuro: Alert and oriented X 3. Moves all extremities spontaneously. No focal deficits noted. Psych:  Responds to questions appropriately with a normal affect. Skin: No rashes or lesions noted  Wt Readings from Last 3 Encounters:  12/14/15 173 lb 6.4 oz (78.7 kg)  11/27/15 171 lb (77.6 kg)  11/13/15 171 lb 6.4 oz (77.7 kg)    Studies/Labs Reviewed:   EKG:  EKG is not ordered today.    Recent Labs: 05/08/2015: ALT 40 11/05/2015: BUN 13; Creatinine, Ser 0.83; Hemoglobin 13.8; Platelets 356; Potassium 4.1; Sodium 135 11/06/2015: TSH 0.740   Lipid Panel No results found for: CHOL, TRIG, HDL, CHOLHDL, VLDL, LDLCALC, LDLDIRECT  Additional studies/ records that were reviewed today include:   Echocardiogram: 10/2015 Study Conclusions  -  Left ventricle: The cavity size was normal. There was mild focal   basal hypertrophy of the septum. Systolic function was vigorous.   The estimated ejection fraction was in the range of 65% to 70%.   Wall motion was normal; there were no regional wall motion   abnormalities. Left ventricular diastolic function parameters   were normal. - Aortic valve: Trileaflet; normal thickness, mildly calcified   leaflets.  [employer] Monitoring: 12/10/2015: SVT with HR peaking at 171 bpm (lasting for less than 30 seconds) - Will have records scanned into the system.  Assessment:    1. SVT (supraventricular tachycardia) ([facility])   2. Palpitations   3. Chest pressure   4. Chronic bronchitis, unspecified chronic bronchitis type ([place])      Plan:   In order of problems listed above:  1. SVT/ Palpitations - admitted in 10/2015 for chest pressure and found to have episodes of sinus tachycardia. Monitor placed in the outpatient setting to evaluate for tachyarrhythmias. Electrolytes and  TSH have been WNL. Echo shows preserved EF with no WMA. Myoview is pending for further ischemic evaluation.  - monitoring strip from 11/30 reviewed which appears to be most consistent with SVT (peak HR of 171 and episode lasting less than 30 seconds). The patient reported feeling dizzy and having palpitations at that time. Says this occurs 2-3 times per week and the episodes last for several minutes at a time.  - reviewed the importance of avoiding alcohol and caffeine (patient only consumes water and decaffeinated tea). Educated on how to perform a Valsalva maneuver. Will initiate low-dose BB with Lopressor 12.5mg  BID (BP at 113/62 today with HR of 92). Titrate as BP allows. Reviewed referral to EP for possible ablation in the future if she continues to have symptomatic episodes. She wishes to attempt medical management first which certainly seems reasonable. Will await for official report of her 30-day monitor. She will report back if her symptoms persist despite initiation of BB therapy, as she may need referral at that time. Otherwise, we will reevaluate in 2 months.  2. Chest Pressure - occurring in the setting of her palpitations when her heart is "racing and pounding out of her chest". - echo in 10/2015 showed a preserved EF of 65-70% with no regional WMA. ETT on 11/22 showed 1-2 mm of ST depression in the anterior and lateral leads. With this being abnormal, a Lexiscan Myoview was recommended for further ischemic evaluation and is scheduled for 12/18/2015.  - encouraged to keep her appointment for her Myoview later this week. Overall, her symptoms of chest discomfort seem to be secondary to her episodes of pSVT.   3. Bronchiectasis  - followed by Pulmonology - would prefer Xopenex over Albuterol to avoid rebound tachycardia.    Medication Adjustments/Labs and Tests Ordered: Current medicines are reviewed at length with the patient today.  Concerns regarding medicines are outlined above.   Medication changes, Labs and Tests ordered today are listed in the Patient Instructions below.  Patient Instructions  Bernerd Pho, PA-C has recommended making the following medication changes: 1. START Metoprolol tartrate 12.5 mg - take 0.5 tablet (12.5 mg total) by mouth twice daily  Please keep the appointment for your stress test on Friday!   [place] recommends that you schedule a follow-up appointment in 2 months with Dr Martinique or a PA/NP.   Signed, Erma Heritage, PA  12/14/2015 2:25 PM    [place], [place], [place]  [address] Phone: [phone]; Fax: [phone]  [address] [place], [place] [address] Phone: [phone]

## 2015-12-14 ENCOUNTER — Ambulatory Visit (INDEPENDENT_AMBULATORY_CARE_PROVIDER_SITE_OTHER): Payer: Medicare Other | Admitting: Student

## 2015-12-14 ENCOUNTER — Encounter: Payer: Self-pay | Admitting: Student

## 2015-12-14 VITALS — BP 113/62 | HR 92 | Ht 67.0 in | Wt 173.4 lb

## 2015-12-14 DIAGNOSIS — R0789 Other chest pain: Secondary | ICD-10-CM | POA: Diagnosis not present

## 2015-12-14 DIAGNOSIS — R002 Palpitations: Secondary | ICD-10-CM | POA: Diagnosis not present

## 2015-12-14 DIAGNOSIS — J479 Bronchiectasis, uncomplicated: Secondary | ICD-10-CM

## 2015-12-14 DIAGNOSIS — I471 Supraventricular tachycardia: Secondary | ICD-10-CM

## 2015-12-14 MED ORDER — METOPROLOL TARTRATE 25 MG PO TABS: 12.5000 mg | ORAL_TABLET | Freq: Two times a day (BID) | ORAL | 3 refills | Status: DC

## 2015-12-14 NOTE — Patient Instructions (Signed)
Bernerd Pho, PA-C has recommended making the following medication changes: 1. START Metoprolol tartrate 12.5 mg - take 0.5 tablet (12.5 mg total) by mouth twice daily  Please keep the appointment for your stress test on Friday!  You DO NOT have to see the PA back on 12/28/15. We will cancel this appointment today.  [place] recommends that you schedule a follow-up appointment in 2 months with Dr Martinique or a PA/NP.

## 2015-12-16 ENCOUNTER — Telehealth (HOSPITAL_COMMUNITY): Payer: Self-pay

## 2015-12-16 NOTE — Telephone Encounter (Signed)
Encounter complete. 

## 2015-12-18 ENCOUNTER — Telehealth (HOSPITAL_COMMUNITY): Payer: Self-pay

## 2015-12-18 ENCOUNTER — Ambulatory Visit (HOSPITAL_COMMUNITY)
Admission: RE | Admit: 2015-12-18 | Discharge: 2015-12-18 | Disposition: A | Discharge status: Home / Self Care | Payer: Medicare Other | Source: Ambulatory Visit | Attending: Cardiology | Admitting: Cardiology

## 2015-12-18 DIAGNOSIS — I259 Chronic ischemic heart disease, unspecified: Secondary | ICD-10-CM | POA: Insufficient documentation

## 2015-12-18 DIAGNOSIS — R0789 Other chest pain: Secondary | ICD-10-CM

## 2015-12-18 DIAGNOSIS — I5189 Other ill-defined heart diseases: Secondary | ICD-10-CM | POA: Diagnosis not present

## 2015-12-18 LAB — MYOCARDIAL PERFUSION IMAGING
CHL CUP MPHR: 175 {beats}/min
CHL CUP NUCLEAR SRS: 0
CHL CUP NUCLEAR SSS: 2
CSEPED: 6 min
CSEPEDS: 0 s
CSEPEW: 7 METS
CSEPHR: 88 %
CSEPPHR: 155 {beats}/min
LV dias vol: 72 mL (ref 46–106)
LVSYSVOL: 26 mL
RPE: 17
Rest HR: 66 {beats}/min
SDS: 2
TID: 1.19

## 2015-12-18 IMAGING — NM NM MISC PROCEDURE
6 series · 36 of 36 positions shown · non-contrast
Comparison: none

[Series 1: wbr rest · 6.40mm/px · 6 of 64 frames shown]
[frame 6/64]
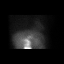
[frame 16/64]
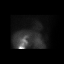
[frame 27/64]
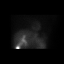
[frame 38/64]
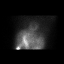
[frame 48/64]
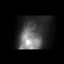
[frame 59/64]
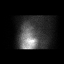

[Series 1: wbr_r-proj_st wbr rest · 6.40mm/px · 6 of 64 frames shown]
[frame 6/64]
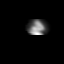
[frame 16/64]
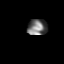
[frame 27/64]
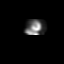
[frame 38/64]
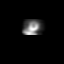
[frame 48/64]
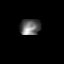
[frame 59/64]
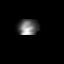

[Series 2: wbr stress-gsp · 6.40mm/px · 6 of 512 frames shown]
[frame 43/512]
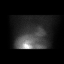
[frame 128/512]
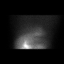
[frame 214/512]
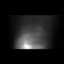
[frame 299/512]
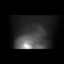
[frame 384/512]
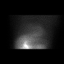
[frame 470/512]
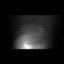

[Series 2: wbr_s-proj_st wbr stress-gsp · 6.40mm/px · 6 of 512 frames shown]
[frame 43/512]
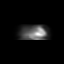
[frame 128/512]
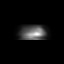
[frame 214/512]
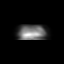
[frame 299/512]
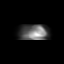
[frame 384/512]
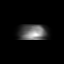
[frame 470/512]
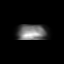

[Series 3: wbr stress-sum-em · 6.40mm/px · 6 of 64 frames shown]
[frame 6/64]
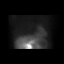
[frame 16/64]
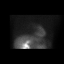
[frame 27/64]
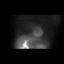
[frame 38/64]
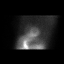
[frame 48/64]
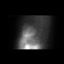
[frame 59/64]
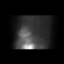

[Series 3: wbr_s-proj_st wbr stress-sum-em · 6.40mm/px · 6 of 64 frames shown]
[frame 6/64]
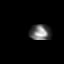
[frame 16/64]
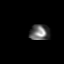
[frame 27/64]
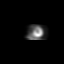
[frame 38/64]
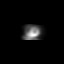
[frame 48/64]
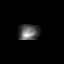
[frame 59/64]
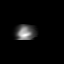

[36 of 36 positions shown; findings below may reference images not displayed]

Canned report from images found in remote index.

Refer to host system for actual result text.

## 2015-12-18 MED ORDER — TECHNETIUM TC 99M TETROFOSMIN IV KIT
29.7000 | PACK | Freq: Once | INTRAVENOUS | Status: AC | PRN
  Administered 2015-12-18: 29.7 via INTRAVENOUS
  Filled 2015-12-18: qty 30

## 2015-12-18 MED ORDER — TECHNETIUM TC 99M TETROFOSMIN IV KIT
11.0000 | PACK | Freq: Once | INTRAVENOUS | Status: AC | PRN
  Administered 2015-12-18: 11 via INTRAVENOUS
  Filled 2015-12-18: qty 11

## 2015-12-18 NOTE — Telephone Encounter (Signed)
Encounter complete. 

## 2015-12-21 ENCOUNTER — Encounter: Payer: Self-pay | Admitting: *Deleted

## 2015-12-28 ENCOUNTER — Ambulatory Visit: Payer: Medicare Other | Admitting: Physician Assistant

## 2016-01-07 ENCOUNTER — Ambulatory Visit: Payer: Medicare Other | Admitting: Cardiology

## 2016-01-14 ENCOUNTER — Ambulatory Visit (INDEPENDENT_AMBULATORY_CARE_PROVIDER_SITE_OTHER): Payer: Medicare HMO | Admitting: Acute Care

## 2016-01-14 ENCOUNTER — Encounter: Payer: Self-pay | Admitting: Acute Care

## 2016-01-14 VITALS — BP 118/68 | HR 109 | Ht 67.0 in | Wt 174.0 lb

## 2016-01-14 DIAGNOSIS — J449 Chronic obstructive pulmonary disease, unspecified: Secondary | ICD-10-CM

## 2016-01-14 DIAGNOSIS — J479 Bronchiectasis, uncomplicated: Secondary | ICD-10-CM | POA: Diagnosis not present

## 2016-01-14 DIAGNOSIS — R002 Palpitations: Secondary | ICD-10-CM | POA: Diagnosis not present

## 2016-01-14 MED ORDER — BUDESONIDE-FORMOTEROL FUMARATE 160-4.5 MCG/ACT IN AERO: 2.0000 | INHALATION_SPRAY | Freq: Two times a day (BID) | RESPIRATORY_TRACT | 4 refills | Status: DC

## 2016-01-14 MED ORDER — ALBUTEROL SULFATE HFA 108 (90 BASE) MCG/ACT IN AERS: 2.0000 | INHALATION_SPRAY | Freq: Four times a day (QID) | RESPIRATORY_TRACT | 3 refills | Status: DC | PRN

## 2016-01-14 NOTE — Assessment & Plan Note (Signed)
2 month follow up>> at baseline Plan: Continue  flutter valve twice daily. Mucinex 600 mg twice daily for chest congestion. Continue  Zyrtec and Flonase Continue  Symbicort 2 puffs twice daily.  Remember to rinse your mouth after use. Use your rescue inhaler as needed up to every 6 hours for shortness of breath or wheezing. Add Zantac 150 mg every night 1 hour before bed.  We will re-check a sputum culture today. Follow  up in 4 months with Dr. Vaughan Browner. Please contact office for sooner follow up if symptoms do not improve or worsen or seek emergency care

## 2016-01-14 NOTE — Assessment & Plan Note (Signed)
Moderate COPD Plan: Continue Symbicort Daily Rescue inhaler as needed for shortness of breath.

## 2016-01-14 NOTE — Progress Notes (Signed)
History of Present Illness Hannah Barron is a 46 y.o. female never smoker  with Bronchiectasis; Migraines; and COPD. She is followed by Dr. Vaughan Browner.   01/14/2016  2 month Follow UP OV: Pt presents to the office today for follow up. She states she continues to have some shortness of breath, especially with activity. She states it is worse with bending over and lying down. She states if she sits for a long time her chest feels heavy, and then she has shortness of breath. She states this is her baseline since she was diagnosed with Bronchiectasis. She does not use her rescue inhaler on a regular basis, but she is compliant with her Symbicort twice daily. She has had recent heart palpitations which have been evaluated by cards. She had an Ed admission 11/05/15 after a syncopal event.Troponins were initially elevated , but returned to normal, suspicion is that it was secondary to demand ischemia with suspected SVT.  Echo and Myocardial Perfusion study were WNL.  Cardiac event monitoring was read as ST. Thyroid function is WNL. ? If could be contributing  cause of dyspnea.She is to follow up with cards for further syncope. She states she has been getting dizzy, but no palpitations.I reminded her to follow up with Cardiology as they instructed her to do. Cough is worse in the am, with some yellow secretions. Last sputum culture specimen in 06/2015  was rejected. We will repeat today. I did ask if her palpitations are triggered by her rescue inhaler use, and she stated no. I did offer Xopenex as an option at the last appointment, but she did not feel it was necessary to justify the additional cost. Pt. Denies fever, chest pain, orthopnea or hemoptysis. No recent car or automobile travel. She is in no distress today in the office. Her husband is with her.  Tests Myocardial Perfusion Study: 12/18/2015  The left ventricular ejection fraction is normal (55-65%).  Nuclear stress EF: 64%.  The myocardial perufusion  images are normal.  There is borderline transient ischemic dilatation with a TID of 1.19.  Consider further evaluation with coronary CTA if clinically indicated.  This is an intermediate risk study due to borderline TID.  Blood pressure demonstrated a normal response to exercise.  There was 62mm of J point depression with upsloping ST segment depression  in the inferolateral leads  Echo 11/07/2015: EF = 65-70%, Aortic valve: Trileaflet; normal thickness, mildly calcified   leaflets  Results for CARRE, BEREZIN (MRN [id number]) as of 11/12/2015 11:30  Ref. Range 11/06/2015 01:14 11/06/2015 04:10 11/06/2015 06:54  Troponin I Latest Ref Range: <0.03 ng/mL 0.14 (HH) 0.04 (HH) <0.03     Past medical hx Past Medical History:  Diagnosis Date  . Bronchiectasis ([place])   . COPD (chronic obstructive pulmonary disease) ([place])   . Migraines   . Paroxysmal SVT (supraventricular tachycardia) ([place])    a. diagnosed in 11/2015.     Past surgical hx, Family hx, Social hx all reviewed.  Current Outpatient Prescriptions on File Prior to Visit  Medication Sig  . cetirizine (ZYRTEC) 10 MG tablet Take 1 tablet (10 mg total) by mouth daily as needed for allergies.  Marland Kitchen dicyclomine (BENTYL) 20 MG tablet Take 20 mg by mouth every 6 (six) hours as needed for spasms.  Marland Kitchen ibuprofen (ADVIL,MOTRIN) 600 MG tablet Take 1 tablet (600 mg total) by mouth every 6 (six) hours as needed for moderate pain.  Marland Kitchen levalbuterol (XOPENEX HFA) 45 MCG/ACT inhaler Inhale 1-2 puffs into the lungs  every 6 (six) hours as needed for wheezing.  . metoprolol tartrate (LOPRESSOR) 25 MG tablet Take 0.5 tablets (12.5 mg total) by mouth 2 (two) times daily.  Marland Kitchen omeprazole (PRILOSEC) 40 MG capsule Take 40 mg by mouth daily as needed (for acid reflux).   . ranitidine (ZANTAC) 150 MG tablet Take 1 tablet (150 mg total) by mouth 2 (two) times daily.  . RESTASIS 0.05 % ophthalmic emulsion INSTILL 1 DROP IN EACH EYE TWICE DAILY.   No current  facility-administered medications on file prior to visit.      No Known Allergies  Review Of Systems:  Constitutional:   No  weight loss, night sweats,  Fevers, chills, fatigue, or  lassitude.  HEENT:   No headaches,  Difficulty swallowing,  Tooth/dental problems, or  Sore throat,                No sneezing, itching, ear ache, nasal congestion, post nasal drip,   CV:  No chest pain,  Orthopnea, PND, swelling in lower extremities, anasarca, dizziness, palpitations, syncope.   GI  No heartburn, indigestion, abdominal pain, nausea, vomiting, diarrhea, change in bowel habits, loss of appetite, bloody stools.   Resp: + shortness of breath with exertion not  at rest.  + excess mucus, + productive cough,  No non-productive cough,  No coughing up of blood.  + change in color of mucus.  No wheezing.  No chest wall deformity  Skin: no rash or lesions.  GU: no dysuria, change in color of urine, no urgency or frequency.  No flank pain, no hematuria   MS:  No joint pain or swelling.  No decreased range of motion.  No back pain.  Psych:  No change in mood or affect. No depression or anxiety.  No memory loss.   Vital Signs BP 118/68 (BP Location: Left Arm, Patient Position: Sitting, Cuff Size: Normal)   Pulse (!) 109   Ht 5\' 7"  (1.702 m)   Wt 174 lb (78.9 kg)   SpO2 98%   BMI 27.25 kg/m    Physical Exam:  General- No distress,  A&Ox3, pleasant ENT: No sinus tenderness, TM clear, pale nasal mucosa, no oral exudate,no post nasal drip, no LAN Cardiac: S1, S2, regular rate and rhythm, no murmur Chest: No wheeze/ rales/ dullness; no accessory muscle use, no nasal flaring, no sternal retractions Abd.: Soft Non-tender Ext: No clubbing cyanosis, edema Neuro:  normal strength Skin: No rashes, warm and dry Psych: normal mood and behavior   Assessment/Plan  Bronchiectasis without acute exacerbation (HCC) 2 month follow up>> at baseline Plan: Continue  flutter valve twice daily. Mucinex  600 mg twice daily for chest congestion. Continue  Zyrtec and Flonase Continue  Symbicort 2 puffs twice daily.  Remember to rinse your mouth after use. Use your rescue inhaler as needed up to every 6 hours for shortness of breath or wheezing. Add Zantac 150 mg every night 1 hour before bed.  We will re-check a sputum culture today. Follow  up in 4 months with Dr. Vaughan Browner. Please contact office for sooner follow up if symptoms do not improve or worsen or seek emergency care   COPD (chronic obstructive pulmonary disease) ([place]) Moderate COPD Plan: Continue Symbicort Daily Rescue inhaler as needed for shortness of breath.  Palpitations Continued dizziness . Plan: Encouraged her to follow up with cardiology as they instructed.    Magdalen Spatz, NP 01/14/2016  1:27 PM

## 2016-01-14 NOTE — Patient Instructions (Addendum)
It is good to see you today. Continue using your Symbicort twice daily. Remember to rinse after use. Continue using your Ventolin as needed for shortness of breath or wheezing. ( Especially when doing what you know makes you short of breath) Sputum Culture today Follow up with Cardiology about your recent heart palpitations and recent dizziness. Follow up with Dr. Vaughan Browner in 4 months. Please contact office for sooner follow up if symptoms do not improve or worsen or seek emergency care

## 2016-01-14 NOTE — Assessment & Plan Note (Signed)
Continued dizziness . Plan: Encouraged her to follow up with cardiology as they instructed.

## 2016-01-15 ENCOUNTER — Encounter: Payer: Self-pay | Admitting: Physician Assistant

## 2016-01-15 ENCOUNTER — Ambulatory Visit (INDEPENDENT_AMBULATORY_CARE_PROVIDER_SITE_OTHER): Payer: Medicare HMO | Admitting: Physician Assistant

## 2016-01-15 VITALS — BP 106/66 | HR 78 | Wt 173.8 lb

## 2016-01-15 DIAGNOSIS — R5383 Other fatigue: Secondary | ICD-10-CM

## 2016-01-15 DIAGNOSIS — R002 Palpitations: Secondary | ICD-10-CM | POA: Diagnosis not present

## 2016-01-15 DIAGNOSIS — R0789 Other chest pain: Secondary | ICD-10-CM

## 2016-01-15 DIAGNOSIS — I471 Supraventricular tachycardia: Secondary | ICD-10-CM | POA: Diagnosis not present

## 2016-01-15 LAB — T3, FREE: T3, Free: 3.9 pg/mL (ref 2.3–4.2)

## 2016-01-15 LAB — T4, FREE: FREE T4: 1 ng/dL (ref 0.8–1.8)

## 2016-01-15 LAB — TSH: TSH: 1.4 m[IU]/L

## 2016-01-15 MED ORDER — METOPROLOL TARTRATE 25 MG PO TABS: 12.5000 mg | ORAL_TABLET | Freq: Three times a day (TID) | ORAL | 3 refills | Status: DC

## 2016-01-15 NOTE — Progress Notes (Signed)
Cardiology Office Note   Date:  01/15/2016   ID:  Hannah Barron, DOB 1970/11/11, MRN [id number]  PCP:  Mauricio Po, FNP  Cardiologist:  Dr Martinique - saw in-hospital 11/06/2015 B. Ahmed Prima, [facility] 12/14/2015  Lynsay Fesperman, Suanne Marker, PA-C    History of Present Illness: Hannah Barron is a 46 y.o. female with a history of bronchiectasis and COPD (never smoker), migraines, palpitations (ST when hospitalized 10/2015), echo 10/2015 w/ EF 65-70%, nl TSH w/ mildly elevated free T4, cystic fibrosis  11/22 POET w/ ST depression, MV planned 12/08 12/04 office visit, event monitor in place, 1 episode SVT seen w/ HR 171. Pt instructed in [place] and started on metoprolol.   Hannah Barron presents for follow up. Her husband is with her.  She has been having more palpitations. They can come at rest or with exertion. Her heart will beat very fast, she will feel weak. She has tried the [place] maneurver several times with effect, but does not do it often. The palpitations make her confused. She has not had her thyroid rechecked.  She has chest pain, tightness, with the palpitations. Sometimes the chest pain will last after the palpitations stop. Usually it goes away when the palpitations stop. She also has DOE, feels this is worse recently. No weight gain, LE edema, orthopnea or PND. She saw Pulm NP yesterday.    Past Medical History:  Diagnosis Date  . Bronchiectasis ([place])   . COPD (chronic obstructive pulmonary disease) ([place])   . Migraines   . Paroxysmal SVT (supraventricular tachycardia) ([place])    a. diagnosed in 11/2015.    Past Surgical History:  Procedure Laterality Date  . CESAREAN SECTION    . LUNG BIOPSY      Current Outpatient Prescriptions  Medication Sig Dispense Refill  . albuterol (PROVENTIL HFA;VENTOLIN HFA) 108 (90 Base) MCG/ACT inhaler Inhale 2 puffs into the lungs every 6 (six) hours as needed for wheezing or shortness of breath. 1 Inhaler 3  . budesonide-formoterol (SYMBICORT)  160-4.5 MCG/ACT inhaler Inhale 2 puffs into the lungs 2 (two) times daily. 1 Inhaler 4  . cetirizine (ZYRTEC) 10 MG tablet Take 1 tablet (10 mg total) by mouth daily as needed for allergies. 10 tablet 0  . dicyclomine (BENTYL) 20 MG tablet Take 20 mg by mouth every 6 (six) hours as needed for spasms.    Marland Kitchen ibuprofen (ADVIL,MOTRIN) 600 MG tablet Take 1 tablet (600 mg total) by mouth every 6 (six) hours as needed for moderate pain. 30 tablet 1  . levalbuterol (XOPENEX HFA) 45 MCG/ACT inhaler Inhale 1-2 puffs into the lungs every 6 (six) hours as needed for wheezing. 1 Inhaler 6  . metoprolol tartrate (LOPRESSOR) 25 MG tablet Take 0.5 tablets (12.5 mg total) by mouth 2 (two) times daily. 90 tablet 3  . omeprazole (PRILOSEC) 40 MG capsule Take 40 mg by mouth daily as needed (for acid reflux).     . ranitidine (ZANTAC) 150 MG tablet Take 1 tablet (150 mg total) by mouth 2 (two) times daily. 30 tablet 5  . RESTASIS 0.05 % ophthalmic emulsion INSTILL 1 DROP IN EACH EYE TWICE DAILY.  2   No current facility-administered medications for this visit.     Allergies:   Patient has no known allergies.    Social History:  The patient  reports that she has never smoked. She has never used smokeless tobacco. She reports that she does not drink alcohol or use drugs.   Family History:  The patient's family  history includes Healthy in her father and mother.    ROS:  Please see the history of present illness. All other systems are reviewed and negative.    PHYSICAL EXAM: VS:  BP 106/66   Pulse 78   Wt 173 lb 12.8 oz (78.8 kg)   SpO2 100%   BMI 27.22 kg/m  , BMI Body mass index is 27.22 kg/m. GEN: Well nourished, well developed, female in no acute distress  HEENT: normal for age  Neck: no JVD, no carotid bruit, no masses Cardiac: RRR; no murmur, no rubs, or gallops Respiratory:  clear to auscultation bilaterally, normal work of breathing GI: soft, nontender, nondistended, + BS MS: no deformity or  atrophy; no edema; distal pulses are 2+ in all 4 extremities   Skin: warm and dry, no rash Neuro:  Strength and sensation are intact Psych: euthymic mood, full affect   EKG:  EKG is not ordered today.  EVENT MONITOR: Per Dr Martinique, SVT looks more like ST, she has a lot, using BB is reasonable.   MYOVIEW:  12/08 **Dr Martinique reviewed and felt no further eval needed  The left ventricular ejection fraction is normal (55-65%).  Nuclear stress EF: 64%.  The myocardial perufusion images are normal.  There is borderline transient ischemic dilatation with a TID of 1.19. Consider further evaluation with coronary CTA if clinically indicated.  This is an intermediate risk study due to borderline TID.  Blood pressure demonstrated a normal response to exercise.  There was 19mm of J point depression with upsloping ST segment depression in the inferolateral leads   Recent Labs: 05/08/2015: ALT 40 11/05/2015: BUN 13; Creatinine, Ser 0.83; Hemoglobin 13.8; Platelets 356; Potassium 4.1; Sodium 135 11/06/2015: TSH 0.740    Lipid Panel No results found for: CHOL, TRIG, HDL, CHOLHDL, VLDL, LDLCALC, LDLDIRECT   Wt Readings from Last 3 Encounters:  01/15/16 173 lb 12.8 oz (78.8 kg)  01/14/16 174 lb (78.9 kg)  12/18/15 173 lb (78.5 kg)     Other studies Reviewed: Additional studies/ records that were reviewed today include: Office notes, hospital records and testing.  ASSESSMENT AND PLAN:  1.  Tachycardia: She has frequent sinus tachycardia. I discussed this with patient and her husband. I advised that sinus tachycardia is a result of a stress on her body. Since her free T4 was mildly elevated, we will recheck her thyroid functions now. I discussed with she and her husband that our ability to increase her metoprolol or any other rate control medication was limited by her blood pressure. We will try increasing her metoprolol to one half tablet 3 times a day and see how she tolerates this.  Upon  review of her event monitor, a high percentage of the time she was in sinus tachycardia. Only one episode possible SVT was seen. However, at other times her heart rate would be greater than 140. Her symptoms are not consistent with POTS. They're not associated with position change, so I do not suspect autonomic dysfunction. She drinks mainly water and does not use caffeine, alcohol or any stimulants. She is encouraged to continue hydration and was told it was okay to add a little salt to her food. Continue to up titrate beta blocker as tolerated.  2. Chest pain: She has no history of CAD. No coronary calcifications are mentioned on the CT report from July. A Myoview showed a normal EF and no evidence of ischemia. Her chest pain comes only in the setting of the tachycardia. No further  evaluation needed at this time, continue to follow for symptoms.   Current medicines are reviewed at length with the patient today.  The patient does not have concerns regarding medicines.  The following changes have been made:  Increase metoprolol  Labs/ tests ordered today include:   Orders Placed This Encounter  Procedures  . TSH  . T4, free  . T3, free     Disposition:   FU with Dr. Martinique  Signed, [name], PA-C  01/15/2016 10:21 AM    [place] Phone: [phone]; Fax: [phone]  This note was written with the assistance of speech recognition software. Please excuse any transcriptional errors.

## 2016-01-15 NOTE — Patient Instructions (Addendum)
Medication Instructions:  INCREASE METOPROLOL 12.5MG  TO THREE TIMES DAILY   If you need a refill on your cardiac medications before your next appointment, please call your pharmacy.  Labwork: TSH,FREE T3 AND FREE T4 TODAY AT [facility] ON THE 1ST FLOOR  Follow-Up: Your physician recommends that you schedule a follow-up appointment in: 3 MONTHS WITH DR Martinique  WILL CALL IF YOU NEED TO FOLLOW UP WITH PRIMARY CARE FOR LAB RESULTS  OK TO SALT FOOD   HAPPY NEW YEAR!  Thank you for choosing [facility] at [employer] [employer], LPN RHONDA BARRETT, PA-C

## 2016-01-19 ENCOUNTER — Other Ambulatory Visit: Payer: Medicare HMO

## 2016-01-19 DIAGNOSIS — J479 Bronchiectasis, uncomplicated: Secondary | ICD-10-CM

## 2016-01-19 NOTE — Addendum Note (Signed)
Addended by: Waylan Rocher on: 01/19/2016 09:55 AM   Modules accepted: Orders

## 2016-01-21 ENCOUNTER — Other Ambulatory Visit: Payer: Medicare HMO

## 2016-01-21 DIAGNOSIS — R0789 Other chest pain: Secondary | ICD-10-CM

## 2016-01-21 LAB — RESPIRATORY CULTURE OR RESPIRATORY AND SPUTUM CULTURE

## 2016-01-25 ENCOUNTER — Other Ambulatory Visit: Payer: Medicare HMO

## 2016-01-25 DIAGNOSIS — R0789 Other chest pain: Secondary | ICD-10-CM

## 2016-01-26 LAB — RESPIRATORY CULTURE OR RESPIRATORY AND SPUTUM CULTURE

## 2016-02-16 NOTE — Progress Notes (Signed)
Cardiology Office Note    Date:  02/17/2016   ID:  Hannah Barron, DOB 1970-05-22, MRN [id number]  PCP:  Mauricio Po, FNP  Cardiologist: Dr. Martinique  Chief Complaint  Patient presents with  . Follow-up  . Shortness of Breath    frequently.  . Dizziness    occassionally.  . Palpitations    History of Present Illness:    Hannah Barron is a 46 y.o. female with past medical history of Bronchiectasis and migraine headaches who presents to the office today for follow-up of palpitations.  She was admitted from 10/26 - 11/07/2015 at [facility] for evaluation of palpitations and chest pain. Cyclic troponin values were minimally elevated at 0.04, 0.14, and 0.04 with her EKG showing no acute ST or T-wave changes. Telemetry was reviewed which showed episodes of sinus tachycardia and this was thought to concur with her symptoms. TSH WNL. Echocardiogram was obtained which showed an EF of 65-70% with no regional WMA.   Was seen in the office by Hannah Kicks, NP on 11/13/2015 and a 30-day event monitor was arranged to evaluate for tachyarrhythmias. An exercise treadmill stress test was performed on 11/22 and showed 1-2 mm of ST depression in the anterior and lateral leads. With this being abnormal, a Lexiscan Myoview was recommended for further ischemic evaluation and was scheduled for 12/18/2015. This showed normal perfusion with < 1 mm ST depression. This was felt to be low risk.    On 12/10/2015, [employer] called reporting the patient had an episode of SVT with peak HR of 171. Reported symptoms at that time were palpitations and dizziness. She was started on metoprolol.  Since then she still has episodes where her heart races. It is not as bad on metoprolol. Associated with sweating. No syncope. Sometimes feels lightheaded. Episodes are random and no clear triggers.    Past Medical History:  Diagnosis Date  . Bronchiectasis ([place])   . COPD (chronic obstructive pulmonary disease) ([place])   .  Migraines   . Paroxysmal SVT (supraventricular tachycardia) ([place])    a. diagnosed in 11/2015.    Past Surgical History:  Procedure Laterality Date  . CESAREAN SECTION    . LUNG BIOPSY      Current Medications: Outpatient Medications Prior to Visit  Medication Sig Dispense Refill  . albuterol (PROVENTIL HFA;VENTOLIN HFA) 108 (90 Base) MCG/ACT inhaler Inhale 2 puffs into the lungs every 6 (six) hours as needed for wheezing or shortness of breath. 1 Inhaler 3  . budesonide-formoterol (SYMBICORT) 160-4.5 MCG/ACT inhaler Inhale 2 puffs into the lungs 2 (two) times daily. 1 Inhaler 4  . cetirizine (ZYRTEC) 10 MG tablet Take 1 tablet (10 mg total) by mouth daily as needed for allergies. 10 tablet 0  . dicyclomine (BENTYL) 20 MG tablet Take 20 mg by mouth every 6 (six) hours as needed for spasms.    Marland Kitchen ibuprofen (ADVIL,MOTRIN) 600 MG tablet Take 1 tablet (600 mg total) by mouth every 6 (six) hours as needed for moderate pain. 30 tablet 1  . levalbuterol (XOPENEX HFA) 45 MCG/ACT inhaler Inhale 1-2 puffs into the lungs every 6 (six) hours as needed for wheezing. 1 Inhaler 6  . RESTASIS 0.05 % ophthalmic emulsion INSTILL 1 DROP IN EACH EYE TWICE DAILY.  2  . metoprolol tartrate (LOPRESSOR) 25 MG tablet Take 0.5 tablets (12.5 mg total) by mouth 3 (three) times daily. 45 tablet 3  . omeprazole (PRILOSEC) 40 MG capsule Take 40 mg by mouth daily as needed (for  acid reflux).     . ranitidine (ZANTAC) 150 MG tablet Take 1 tablet (150 mg total) by mouth 2 (two) times daily. 30 tablet 5   No facility-administered medications prior to visit.      Allergies:   Patient has no known allergies.   Social History   Social History  . Marital status: Married    Spouse name: N/A  . Number of children: 1  . Years of education: 81   Occupational History  . disability    Social History Main Topics  . Smoking status: Never Smoker  . Smokeless tobacco: Never Used  . Alcohol use No  . Drug use: No  .  Sexual activity: Yes   Other Topics Concern  . None   Social History Narrative   Fun: Watch TV     Family History:  The patient's family history includes Healthy in her father and mother. No family history of CAD or cardiac arrhythmias.   Review of Systems:   Please see the history of present illness.     As noted in HPI.  Positive for dizziness.  All other systems reviewed and are otherwise negative except as noted above.   Physical Exam:    VS:  BP 126/73   Pulse (!) 103   Ht 5\' 7"  (1.702 m)   Wt 172 lb 3.2 oz (78.1 kg)   BMI 26.97 kg/m    General: Well developed, well nourished, female appearing in no acute distress. Head: Normocephalic, atraumatic, sclera non-icteric, no xanthomas, nares are without discharge.  Neck: No carotid bruits. JVD not elevated.  Lungs: Respirations regular and unlabored, without wheezes or rales.  Heart: Regular rate and rhythm. No S3 or S4.  No murmur, no rubs, or gallops appreciated. Abdomen: Soft, non-tender, non-distended with normoactive bowel sounds. No hepatomegaly. No rebound/guarding. No obvious abdominal masses. Msk:  Strength and tone appear normal for age. No joint deformities or effusions. Extremities: No clubbing or cyanosis. No edema.  Distal pedal pulses are 2+ bilaterally. Neuro: Alert and oriented X 3. Moves all extremities spontaneously. No focal deficits noted. Psych:  Responds to questions appropriately with a normal affect. Skin: No rashes or lesions noted  Wt Readings from Last 3 Encounters:  02/17/16 172 lb 3.2 oz (78.1 kg)  01/15/16 173 lb 12.8 oz (78.8 kg)  01/14/16 174 lb (78.9 kg)    Studies/Labs Reviewed:   EKG:  EKG is not ordered today.    Recent Labs: 05/08/2015: ALT 40 11/05/2015: BUN 13; Creatinine, Ser 0.83; Hemoglobin 13.8; Platelets 356; Potassium 4.1; Sodium 135 01/15/2016: TSH 1.40   Lipid Panel No results found for: CHOL, TRIG, HDL, CHOLHDL, VLDL, LDLCALC, LDLDIRECT  Additional studies/ records  that were reviewed today include:   Echocardiogram: 10/2015 Study Conclusions  - Left ventricle: The cavity size was normal. There was mild focal   basal hypertrophy of the septum. Systolic function was vigorous.   The estimated ejection fraction was in the range of 65% to 70%.   Wall motion was normal; there were no regional wall motion   abnormalities. Left ventricular diastolic function parameters   were normal. - Aortic valve: Trileaflet; normal thickness, mildly calcified   leaflets.  Myoview: 12/18/15: Study Highlights    The left ventricular ejection fraction is normal (55-65%).  Nuclear stress EF: 64%.  The myocardial perufusion images are normal.  There is borderline transient ischemic dilatation with a TID of 1.19. Consider further evaluation with coronary CTA if clinically indicated.  This is  an intermediate risk study due to borderline TID.  Blood pressure demonstrated a normal response to exercise.  There was 17mm of J point depression with upsloping ST segment depression in the inferolateral leads     [employer] Monitoring: 12/10/2015: SVT with HR peaking at 171 bpm (lasting for less than 30 seconds) - Will have records scanned into the system.  Assessment:    1. SVT (supraventricular tachycardia) (HCC)   2. Palpitations   3. Fast heart beat      Plan:   In order of problems listed above:  1. SVT/ Palpitations/ sinus tachycardia  other cardiac evaluation is unremarkable. Will switch metoprolol to Toprol XL 50 mg daily. Avoid stimulants. Follow up in 6  Months.   2. Chest Pressure - occurring in the setting of her palpitations when her heart is "racing and pounding out of her chest". - echo in 10/2015 showed a preserved EF of 65-70% with no regional WMA. ETT on 11/22 showed 1-2 mm of ST depression in the anterior and lateral leads. No coronary calcifications are mentioned on the CT report from July. A Myoview showed a normal EF and no evidence of  ischemia. Her chest pain comes only in the setting of the tachycardia. No further evaluation needed at this time, continue to follow for symptoms.  3. Bronchiectasis  - followed by Pulmonology - continue  Xopenex    Medication Adjustments/Labs and Tests Ordered: Current medicines are reviewed at length with the patient today.  Concerns regarding medicines are outlined above.  Medication changes, Labs and Tests ordered today are listed in the Patient Instructions below.    Signed, Peter Martinique, [place]  02/17/2016 11:34 AM    [facility] [address], [place], [place] [address] Phone: [phone]

## 2016-02-17 ENCOUNTER — Encounter: Payer: Self-pay | Admitting: Cardiology

## 2016-02-17 ENCOUNTER — Ambulatory Visit (INDEPENDENT_AMBULATORY_CARE_PROVIDER_SITE_OTHER): Payer: Medicare HMO | Admitting: Cardiology

## 2016-02-17 VITALS — BP 126/73 | HR 103 | Ht 67.0 in | Wt 172.2 lb

## 2016-02-17 DIAGNOSIS — I471 Supraventricular tachycardia: Secondary | ICD-10-CM

## 2016-02-17 DIAGNOSIS — R002 Palpitations: Secondary | ICD-10-CM

## 2016-02-17 DIAGNOSIS — R000 Tachycardia, unspecified: Secondary | ICD-10-CM

## 2016-02-17 MED ORDER — METOPROLOL SUCCINATE ER 50 MG PO TB24: 50.0000 mg | ORAL_TABLET | Freq: Every day | ORAL | 3 refills | Status: DC

## 2016-02-17 NOTE — Patient Instructions (Signed)
We will switch metoprolol to Toprol XL 50 mg daily.   I will see you in 6 months.

## 2016-02-22 ENCOUNTER — Ambulatory Visit (INDEPENDENT_AMBULATORY_CARE_PROVIDER_SITE_OTHER): Payer: Medicare HMO | Admitting: Obstetrics & Gynecology

## 2016-02-22 ENCOUNTER — Ambulatory Visit: Payer: Medicare HMO | Admitting: Clinical

## 2016-02-22 VITALS — BP 111/63 | HR 79 | Wt 174.0 lb

## 2016-02-22 DIAGNOSIS — R102 Pelvic and perineal pain: Secondary | ICD-10-CM

## 2016-02-22 DIAGNOSIS — D259 Leiomyoma of uterus, unspecified: Secondary | ICD-10-CM | POA: Diagnosis not present

## 2016-02-22 DIAGNOSIS — F4323 Adjustment disorder with mixed anxiety and depressed mood: Secondary | ICD-10-CM

## 2016-02-22 NOTE — Patient Instructions (Signed)
Laparoscopically Assisted Vaginal Hysterectomy A laparoscopically assisted vaginal hysterectomy (LAVH) is a surgical procedure to remove the uterus and cervix, and sometimes the ovaries and fallopian tubes. During an LAVH, some of the surgical removal is done through the vagina, and the rest is done through a few small surgical cuts (incisions) in the abdomen. This procedure is usually considered in women when a vaginal hysterectomy is not an option. Your health care provider will discuss the risks and benefits of the different surgical techniques at your appointment. Generally, recovery time is faster and there are fewer complications after laparoscopic procedures than after open incisional procedures. Tell a health care provider about:  Any allergies you have.  All medicines you are taking, including vitamins, herbs, eye drops, creams, and over-the-counter medicines.  Any problems you or family members have had with anesthetic medicines.  Any blood disorders you have.  Any surgeries you have had.  Any medical conditions you have. What are the risks? Generally, this is a safe procedure. However, as with any procedure, complications can occur. Possible complications include:  Allergies to medicines.  Difficulty breathing.  Bleeding.  Infection.  Damage to other structures near your uterus and cervix. What happens before the procedure?  Ask your health care provider about changing or stopping your regular medicines.  Take certain medicines, such as a colon-emptying preparation, as directed.  Do not eat or drink anything for at least 8 hours before your surgery.  Stop smoking if you smoke. Stopping will improve your health after surgery.  Arrange for a ride home after surgery and for help at home during recovery. What happens during the procedure?  An IV tube will be put into one of your veins in order to give you fluids and medicines.  You will receive medicines to relax you  and medicines that make you sleep (general anesthetic).  You may have a flexible tube (catheter) put into your bladder to drain urine.  You may have a tube put through your nose or mouth that goes into your stomach (nasogastric tube). The nasogastric tube removes digestive fluids and prevents you from feeling nauseated and from vomiting.  Tight-fitting (compression) stockings will be placed on your legs to promote circulation.  Three to four small incisions will be made in your abdomen. An incision also will be made in your vagina. Probes and tools will be inserted into the small incisions. The uterus and cervix are removed (and possibly your ovaries and fallopian tubes) through your vagina as well as through the small incisions that were made in the abdomen.  Your vagina is then sewn back to normal. What happens after the procedure?  You may have a liquid diet temporarily. You will most likely return to, and tolerate, your usual diet the day after surgery.  You will be passing urine through a catheter. It will be removed the day after surgery.  Your temperature, breathing rate, heart rate, blood pressure, and oxygen level will be monitored regularly.  You will still wear compression stockings on your legs until you are able to move around.  You will use a special device or do breathing exercises to keep your lungs clear.  You will be encouraged to walk as soon as possible. This information is not intended to replace advice given to you by your health care provider. Make sure you discuss any questions you have with your health care provider. Document Released: 12/16/2010 Document Revised: 06/04/2015 Document Reviewed: 07/12/2012 Elsevier Interactive Patient Education  2017 Elsevier Inc.  

## 2016-02-22 NOTE — BH Specialist Note (Signed)
Session Start time: 4:07  End Time: 4:17 Total Time:  10 minutes Type of Service: Behavioral Health - Individual/Family Interpreter: No.   Interpreter Name & Language: n/a # [facility] Visits July 2017-June 2018: 1st  SUBJECTIVE: Hannah Barron is a 46 y.o. female  Pt. was referred by Dr Ihor Dow for:  anxiety and depression. Pt. reports the following symptoms/concerns: Pt states that her primary concern is that she worries about her family in [place]; copes by watching TV and listening to music to sleep, and spending time with her husband. Pt most interested to find out about community resources. Duration of problem:  About one year Severity: mild Previous treatment: none  OBJECTIVE: Mood: Appropriate & Affect: Appropriate Risk of harm to self or others: No known risk of harm to self or others Assessments administered: PHQ9: 11/ GAD7: 4  LIFE CONTEXT:  Family & Social: Lives with husband; 89yo daughter in college; parents and extended family in [place]  School/ Work: Undetermined  Self-Care: TV and music  Life changes: move to [place] in past year, unfamiliar with resources available in area What is important to pt/family (values): Family  GOALS ADDRESSED:  -Reduce symptoms of anxiety and depression  INTERVENTIONS: Motivational Interviewing   ASSESSMENT:  Pt currently experiencing Adjustment disorder with mixed anxious and depressed mood.  Pt may benefit from psychoeducation and brief therapeutic intervention regarding coping with symptoms of anxiety and depression.   PLAN: 1. F/U with behavioral health clinician: As needed 2. Behavioral Health meds: none 3. Behavioral recommendations:  -Consider [employer] for additional social support via classes/workshops for women -Read educational material regarding coping with symptoms of anxiety and depression -Consider sleep app as additional self-coping strategy for improved sleep 4. Referral: Brief  Counseling/Psychotherapy, Data processing manager and Psychoeducation 5. From scale of 1-10, how likely are you to follow plan: [place]:   Warm Hand Off Completed.       Depression screen [facility] 2/9 02/22/2016  Decreased Interest 3  Down, Depressed, Hopeless 0  PHQ - 2 Score 3  Altered sleeping 3  Tired, decreased energy 2  Change in appetite 1  Feeling bad or failure about yourself  0  Trouble concentrating 2  Moving slowly or fidgety/restless 0  Suicidal thoughts 0  PHQ-9 Score 11   GAD 7 : Generalized Anxiety Score 02/22/2016  Nervous, Anxious, on Edge 0  Control/stop worrying 0  Worry too much - different things 3  Trouble relaxing 1  Restless 0  Easily annoyed or irritable 0  Afraid - awful might happen 0  Total GAD 7 Score 4

## 2016-02-22 NOTE — Progress Notes (Signed)
History:  46 y.o. G1P1001 here today for f/u of uterine fibroids.  LMP 01/16/2016.  Pt continues to c/o pelvic pain. She has uterine fiborids.  The pain is now daily and it makes it difficult for her to sleep.  It limits her ADLs. She has been followed for this in the past and declined surgery but pt is now requesting definitive management as it is adversely affecting her quality of life and conservative measures have failed.   In the past she declined surgery because she wanted to conceive however, she now feels that she is too old and does not want to deal with her sx any longer.  She does not want to try any other conservative measures.  The following portions of the patient's history were reviewed and updated as appropriate: allergies, current medications, past family history, past medical history, past social history, past surgical history and problem list.  Review of Systems:  Pertinent items are noted in HPI.   Objective:  Physical Exam Blood pressure 111/63, pulse 79, weight 174 lb (78.9 kg), last menstrual period 01/16/2016. BP 111/63   Pulse 79   Wt 174 lb (78.9 kg)   LMP 01/16/2016   BMI 27.25 kg/m  CONSTITUTIONAL: Well-developed, well-nourished female in no acute distress.  HENT:  Normocephalic, atraumatic EYES: Conjunctivae and EOM are normal. No scleral icterus.  NECK: Normal range of motion SKIN: Skin is warm and dry. No rash noted. Not diaphoretic.No pallor. [place]: Alert and oriented to person, place, and time. Normal coordination.  Abd: Soft, nontender and nondistended Pelvic: Normal appearing external genitalia; normal appearing vaginal mucosa and cervix.  Normal discharge.  10 week sized uterus, no other palpable masses, no adnexal tenderness but, there was +uterine tenderness  Labs and Imaging 06/19/2015 CLINICAL DATA:  Pelvic pain in female.  Fibroids.  LMP 06/11/2015.  EXAM: TRANSABDOMINAL AND TRANSVAGINAL ULTRASOUND OF PELVIS  TECHNIQUE: Both transabdominal  and transvaginal ultrasound examinations of the pelvis were performed. Transabdominal technique was performed for global imaging of the pelvis including uterus, ovaries, adnexal regions, and pelvic cul-de-sac. It was necessary to proceed with endovaginal exam following the transabdominal exam to visualize the endometrial stripe and ovaries.  COMPARISON:  CT on 05/09/2015  FINDINGS: Uterus  Measurements: 9.6 x 5.0 x 7.8 cm. Several intramural and subserosal uterine fibroids are seen. The largest fibroids are subserosal in location, within the uterine fundus measuring 4.0 cm in the left lateral corpus measuring 3.8 cm.  Endometrium  Thickness: 6 mm.  No focal abnormality visualized.  Right ovary  Measurements: 3.0 x 1.9 x 1.6 cm. Normal appearance/no adnexal mass.  Left ovary  Measurements: 3.5 x 1.1 x 1.7 cm. Normal appearance/no adnexal mass.  Other findings  No abnormal free fluid.  IMPRESSION: Several intramural and subserosal uterine fibroids, largest measuring 4 cm.  Normal appearance of both ovaries.  No adnexal mass identified.  Assessment & Plan:  Symptomatic uterine fibroids causing increasing pelvic pain  Patient desires surgical management with LAVH with bilateral salpingectomy.  The risks of surgery were discussed in detail with the patient including but not limited to: bleeding which may require transfusion or reoperation; infection which may require prolonged hospitalization or re-hospitalization and antibiotic therapy; injury to bowel, bladder, ureters and major vessels or other surrounding organs; need for additional procedures including laparotomy; thromboembolic phenomenon, incisional problems and other postoperative or anesthesia complications.  Patient was told that the likelihood that her condition and symptoms will be treated effectively with this surgical management was very high;  the postoperative expectations were also discussed in  detail. The patient also understands the alternative treatment options which were discussed in full. All questions were answered.  She was told that she will be contacted by our surgical scheduler regarding the time and date of her surgery; routine preoperative instructions of having nothing to eat or drink after midnight on the day prior to surgery and also coming to the hospital 1 1/2 hours prior to her time of surgery were also emphasized.  She was told she may be called for a preoperative appointment about a week prior to surgery and will be given further preoperative instructions at that visit. Printed patient education handouts about the procedure were given to the patient to review at home.  Total face-to-face time with patient was 15 min.  Greater than 50% was spent in counseling and coordination of care with the patient.   Addendum: This pt needs to f/u for a PAP prior to surgery unless we can document tha tshe had a normal  PAP from another location.  Geraldine Tesar L. Harraway-Smith, M.D., [facility]

## 2016-02-23 ENCOUNTER — Encounter (HOSPITAL_COMMUNITY): Payer: Self-pay

## 2016-02-28 ENCOUNTER — Encounter: Payer: Self-pay | Admitting: Obstetrics & Gynecology

## 2016-03-01 ENCOUNTER — Telehealth: Payer: Self-pay | Admitting: Cardiology

## 2016-03-01 ENCOUNTER — Telehealth: Payer: Self-pay | Admitting: Pulmonary Disease

## 2016-03-01 NOTE — Telephone Encounter (Signed)
New message     Request for surgical clearance:  1. What type of surgery is being performed? Lap, vaginal hysterectomy   2. When is this surgery scheduled? 04/05/16  3. Are there any medications that need to be held prior to surgery and how long? Doesn't know   4. Name of physician performing surgery? Dr Lavonia Drafts  5. What is your office phone and fax number? Fax [phone] ofc [phone]  Needs medical clearance for surgery

## 2016-03-01 NOTE — Telephone Encounter (Signed)
Note faxed to [facility] attention Morey Hummingbird at fax # [phone].

## 2016-03-01 NOTE — Telephone Encounter (Signed)
Called and spoke with Hannah Barron at [facility] and she is aware that the pt has not been seen by PM since 06/2015.  Pt was seen by SG 1/18 and has an upcoming appt for 5/21 to see PM.  Pt will need surgical clearance.  PM please advise.  thanks

## 2016-03-01 NOTE — Telephone Encounter (Signed)
She is clear for hysterectomy from a cardiac standpoint.  Keiri Solano [place] MD, [facility]

## 2016-03-02 NOTE — Telephone Encounter (Signed)
I have not seen this patient since June 17. Ask SG if she would clear based on January visit. Otherwise see if she can be seen earlier in my clinic.   PM

## 2016-03-02 NOTE — Telephone Encounter (Signed)
SG would you be willing to clear this pt for surgery based on your January visit with her?

## 2016-03-10 DIAGNOSIS — E119 Type 2 diabetes mellitus without complications: Secondary | ICD-10-CM | POA: Diagnosis not present

## 2016-03-10 DIAGNOSIS — H16223 Keratoconjunctivitis sicca, not specified as Sjogren's, bilateral: Secondary | ICD-10-CM | POA: Diagnosis not present

## 2016-03-11 NOTE — Telephone Encounter (Signed)
SG please advise. Thanks   

## 2016-03-11 NOTE — Telephone Encounter (Signed)
She would need to be seen. Thanks

## 2016-03-11 NOTE — Telephone Encounter (Addendum)
Attempted to call the [facility]. There was no answer and I could not leave a message. We need to know if pt still needs surgical clearance as this message was originated on 03/01/16.

## 2016-03-14 NOTE — Telephone Encounter (Signed)
Spoke with pt. She has been scheduled on 03/16/16 at 10am. Nothing further was needed.

## 2016-03-15 ENCOUNTER — Ambulatory Visit: Payer: Self-pay | Admitting: Obstetrics & Gynecology

## 2016-03-16 ENCOUNTER — Encounter: Payer: Self-pay | Admitting: Adult Health

## 2016-03-16 ENCOUNTER — Other Ambulatory Visit: Payer: Medicare HMO

## 2016-03-16 ENCOUNTER — Ambulatory Visit (INDEPENDENT_AMBULATORY_CARE_PROVIDER_SITE_OTHER): Payer: Medicare HMO | Admitting: Adult Health

## 2016-03-16 VITALS — BP 118/80 | HR 106 | Ht 67.0 in | Wt 171.0 lb

## 2016-03-16 DIAGNOSIS — J449 Chronic obstructive pulmonary disease, unspecified: Secondary | ICD-10-CM

## 2016-03-16 DIAGNOSIS — Z01818 Encounter for other preprocedural examination: Secondary | ICD-10-CM

## 2016-03-16 DIAGNOSIS — J479 Bronchiectasis, uncomplicated: Secondary | ICD-10-CM

## 2016-03-16 DIAGNOSIS — R918 Other nonspecific abnormal finding of lung field: Secondary | ICD-10-CM

## 2016-03-16 MED ORDER — FLUTTER DEVI: 0 refills | Status: AC

## 2016-03-16 NOTE — Addendum Note (Signed)
Addended by: Parke Poisson E on: 03/16/2016 01:05 PM   Modules accepted: Orders

## 2016-03-16 NOTE — Addendum Note (Signed)
Addended by: Parke Poisson E on: 03/16/2016 11:29 AM   Modules accepted: Orders

## 2016-03-16 NOTE — Progress Notes (Signed)
@Patient  ID: Hannah Barron, female    DOB: July 30, 1970, 47 y.o.   MRN: [id number]  No chief complaint on file.   Referring provider: [place] Circle, FNP  HPI: 46 yo female never smoker ,  Followed for Bronchiectasis , ? Cystic fibrosis , chronic obstructive dz.  Say in [place] VATS + Cystic Fibrosis .  From [place] , moved to [place] 1998 .   TEST  Myocardial perfusion study 12/2015 >neg  Ischemia , EF 55% Echo 10/2015 >EF 65%.  PFT 06/2015 FEV1 60%, ratio 63, FVC 77%,  CT chest 07/2015 >Areas of varicose and cystic bronchiectasis throughout the lungs Bilaterally,  Extensive air trapping from small airways disease.,  Multiple pulmonary nodules scattered throughout the lungs  03/16/2016 Follow up : Bronchiectasis /Surgical Clearance  Pt returns for follow up for surgical clearance . She is planning upcoming hysterectomy secondary to uterine fibroids.  No date is planned.   Pt is followed for Bronchiectasis , with previous dx of cystic fibrosis/VATS .  Says over her breathing has been doing okay . Has intermittently dyspnea. Not very active due to breathing. Has minimal cough and hoarseness. No fever. No chest pain . Last abx was 6 months ago.  Remains on Symbicort Twice daily  .  Does not have flutter valve , lost it.  Spirometry today shows FEV1 54%, ratio 64, FVC 69%.      No Known Allergies  Immunization History  Administered Date(s) Administered  . Influenza,inj,Quad PF,36+ Mos 11/07/2015  . Pneumococcal Polysaccharide-23 11/07/2015    Past Medical History:  Diagnosis Date  . Bronchiectasis ([place])   . COPD (chronic obstructive pulmonary disease) ([place])   . Migraines   . Paroxysmal SVT (supraventricular tachycardia) ([place])    a. diagnosed in 11/2015.    Tobacco History: History  Smoking Status  . Never Smoker  Smokeless Tobacco  . Never Used   Counseling given: Not Answered   Outpatient Encounter Prescriptions as of 03/16/2016  Medication Sig  .  albuterol (PROVENTIL HFA;VENTOLIN HFA) 108 (90 Base) MCG/ACT inhaler Inhale 2 puffs into the lungs every 6 (six) hours as needed for wheezing or shortness of breath.  . budesonide-formoterol (SYMBICORT) 160-4.5 MCG/ACT inhaler Inhale 2 puffs into the lungs 2 (two) times daily.  . cetirizine (ZYRTEC) 10 MG tablet Take 1 tablet (10 mg total) by mouth daily as needed for allergies.  Marland Kitchen dicyclomine (BENTYL) 20 MG tablet Take 20 mg by mouth every 6 (six) hours as needed for spasms.  Marland Kitchen ibuprofen (ADVIL,MOTRIN) 600 MG tablet Take 1 tablet (600 mg total) by mouth every 6 (six) hours as needed for moderate pain.  Marland Kitchen levalbuterol (XOPENEX HFA) 45 MCG/ACT inhaler Inhale 1-2 puffs into the lungs every 6 (six) hours as needed for wheezing.  . metoprolol succinate (TOPROL-XL) 50 MG 24 hr tablet Take 1 tablet (50 mg total) by mouth daily. Take with or immediately following a meal.  . RESTASIS 0.05 % ophthalmic emulsion INSTILL 1 DROP IN EACH EYE TWICE DAILY.   No facility-administered encounter medications on file as of 03/16/2016.      Review of Systems  Constitutional:   No  weight loss, night sweats,  Fevers, chills,  +fatigue, or  lassitude.  HEENT:   No headaches,  Difficulty swallowing,  Tooth/dental problems, or  Sore throat,                No sneezing, itching, ear ache, nasal congestion, post nasal drip,   CV:  No  chest pain,  Orthopnea, PND, swelling in lower extremities, anasarca, dizziness, palpitations, syncope.   GI  No heartburn, indigestion, abdominal pain, nausea, vomiting, diarrhea, change in bowel habits, loss of appetite, bloody stools.   Resp:  .  No chest wall deformity  Skin: no rash or lesions.  GU: no dysuria, change in color of urine, no urgency or frequency.  No flank pain, no hematuria   MS:  No joint pain or swelling.  No decreased range of motion.  No back pain.    Physical Exam  BP 118/80 (BP Location: Right Arm, Cuff Size: Normal)   Pulse (!) 106   Ht 5\' 7"  (1.702  m)   Wt 171 lb (77.6 kg)   SpO2 99%   BMI 26.78 kg/m   GEN: A/Ox3; pleasant , NAD    HEENT:  [place]/AT,  EACs-clear, TMs-wnl, NOSE-clear drainage THROAT-clear, no lesions, no postnasal drip or exudate noted.   NECK:  Supple w/ fair ROM; no JVD; normal carotid impulses w/o bruits; no thyromegaly or nodules palpated; no lymphadenopathy.    RESP  Clear  P & A; w/o, wheezes/ rales/ or rhonchi. no accessory muscle use, no dullness to percussion  CARD:  RRR, no m/r/g, no peripheral edema, pulses intact, no cyanosis or clubbing.  GI:   Soft & nt; nml bowel sounds; no organomegaly or masses detected.   Musco: Warm bil, no deformities or joint swelling noted.   Neuro: alert, no focal deficits noted.    Skin: Warm, no lesions or rashes    Lab Results:ProBNP No results found for: PROBNP  Imaging: No results found.   Assessment & Plan:   Bronchiectasis without acute exacerbation (HCC) Chronic Bronchiectasis ? Cystic Fibrosis  Check CF panel, alpha 1 , quantiferon gold  Add flutter  May need VEST going forward   Plan  Patient Instructions  Set up CT chest w/o contrast to follow lung nodules.  Labs today .  Begin Flutter valve Twice daily  .  You are approved from pulmonary standpoint to proceed with necessary GYN surgery .  Follow up Dr. Vaughan Browner in 2 months and As needed      COPD (chronic obstructive pulmonary disease) ([place]) Chronic obstructive lung dz from bronchiectasis , never smoker  Spirometry somewhat stable  Cont  On Symbicort  Add flutter.    Lung nodules Seen on previous CT chest in 07/2015 , will need serial follow up  Check CT chest .      Rexene Edison, NP 03/16/2016

## 2016-03-16 NOTE — Assessment & Plan Note (Signed)
Seen on previous CT chest in 07/2015 , will need serial follow up  Check CT chest .

## 2016-03-16 NOTE — Patient Instructions (Addendum)
Set up CT chest w/o contrast to follow lung nodules.  Labs today .  Begin Flutter valve Twice daily  .  You are approved from pulmonary standpoint to proceed with necessary GYN surgery .  Follow up Dr. Vaughan Browner in 2 months and As needed

## 2016-03-16 NOTE — Assessment & Plan Note (Signed)
Chronic obstructive lung dz from bronchiectasis , never smoker  Spirometry somewhat stable  Cont  On Symbicort  Add flutter.

## 2016-03-16 NOTE — Assessment & Plan Note (Addendum)
Chronic Bronchiectasis ? Cystic Fibrosis  Check CF panel, alpha 1 , quantiferon gold  Add flutter  May need VEST going forward   Plan  Patient Instructions  Set up CT chest w/o contrast to follow lung nodules.  Labs today .  Begin Flutter valve Twice daily  .  You are approved from pulmonary standpoint to proceed with necessary GYN surgery .  Follow up Dr. Vaughan Browner in 2 months and As needed

## 2016-03-18 DIAGNOSIS — Z01818 Encounter for other preprocedural examination: Secondary | ICD-10-CM | POA: Insufficient documentation

## 2016-03-18 LAB — QUANTIFERON TB GOLD ASSAY (BLOOD)
Interferon Gamma Release Assay: POSITIVE — AB
Mitogen-Nil: >10 IU/mL
QUANTIFERON TB AG MINUS NIL: 2.13 [IU]/mL
Quantiferon Nil Value: 0.05 IU/mL

## 2016-03-18 NOTE — Progress Notes (Signed)
LMOMTCB x 1 

## 2016-03-18 NOTE — Assessment & Plan Note (Signed)
Pulmonary preop clearance . She is currently stable with her bronchiectaisis without flare . Lung function is stable .  She is at moderate pulmonary risk due to underlying chronic lung disease but approved for a necessary GYN surgery    Major Pulmonary risks identified in the multifactorial risk analysis are but not limited to a) pneumonia; b) recurrent intubation risk; c) prolonged or recurrent acute respiratory failure needing mechanical ventilation; d) prolonged hospitalization; e) DVT/Pulmonary embolism; f) Acute Pulmonary edema  Recommend  Short duration of surgery as much as possible and avoid paralytic if possible  Aggressive pulmonary toilet with o2, bronchodilatation, and incentive spirometry and early ambulation

## 2016-03-21 ENCOUNTER — Ambulatory Visit (INDEPENDENT_AMBULATORY_CARE_PROVIDER_SITE_OTHER)
Admission: RE | Admit: 2016-03-21 | Discharge: 2016-03-21 | Disposition: A | Discharge status: Home / Self Care | Payer: Medicare HMO | Source: Ambulatory Visit | Attending: Adult Health | Admitting: Adult Health

## 2016-03-21 DIAGNOSIS — J479 Bronchiectasis, uncomplicated: Secondary | ICD-10-CM | POA: Diagnosis not present

## 2016-03-21 DIAGNOSIS — R918 Other nonspecific abnormal finding of lung field: Secondary | ICD-10-CM

## 2016-03-21 LAB — ALPHA-1 ANTITRYPSIN PHENOTYPE: A-1 Antitrypsin: 161 mg/dL (ref 83–199)

## 2016-03-21 IMAGING — CT CT CHEST W/O CM
2 of 3 series · 15 of 36 positions shown, 18 images · non-contrast
Comparison: [DATE] high-resolution chest CT.

CLINICAL DATA: Follow-up bronchiectasis and pulmonary nodules.

EXAM:
CT CHEST WITHOUT CONTRAST
TECHNIQUE: Multidetector CT imaging of the chest was performed following the
standard protocol without IV contrast.

[Series 2: thorax · axial · 0.60mm/px · z∈[-321,-41]mm · 12 of 166 slices shown, 15 images]
[im 13/166  mediastinal]
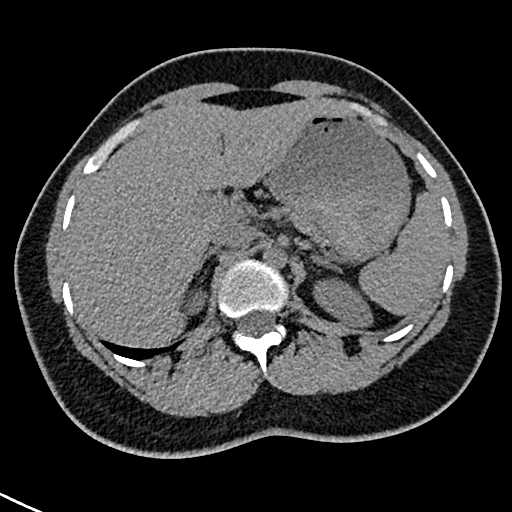
[im 13/166  lung]
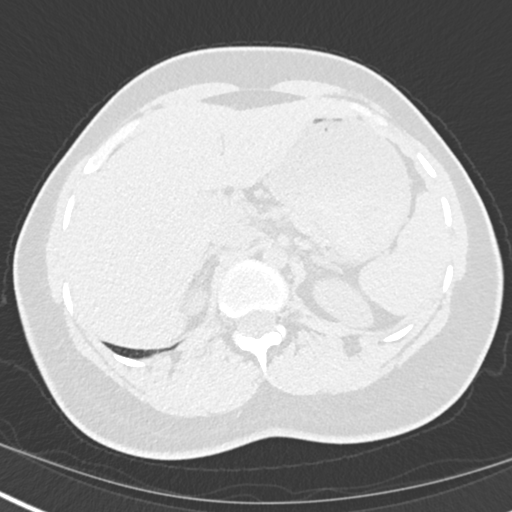
[im 25/166  lung]
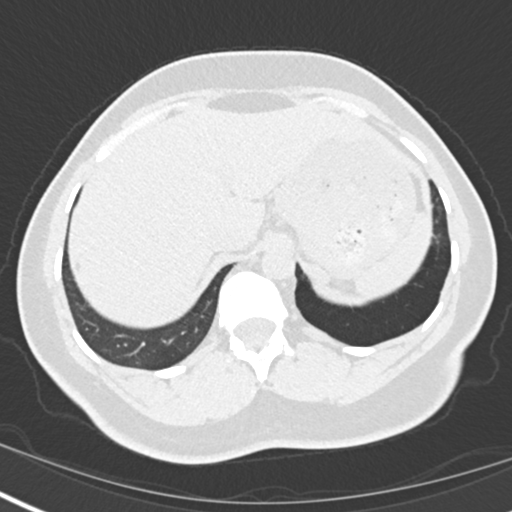
[im 37/166  lung]
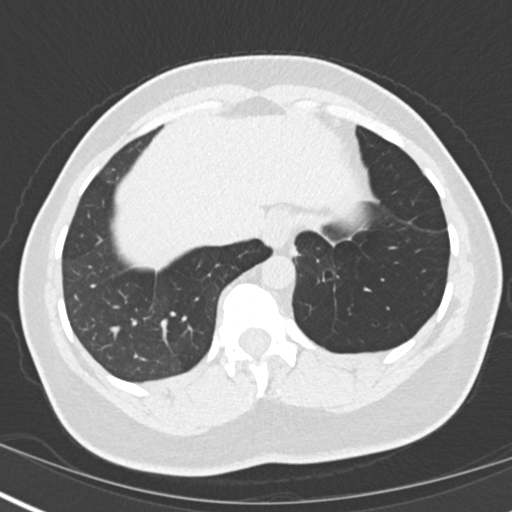
[im 49/166  lung]
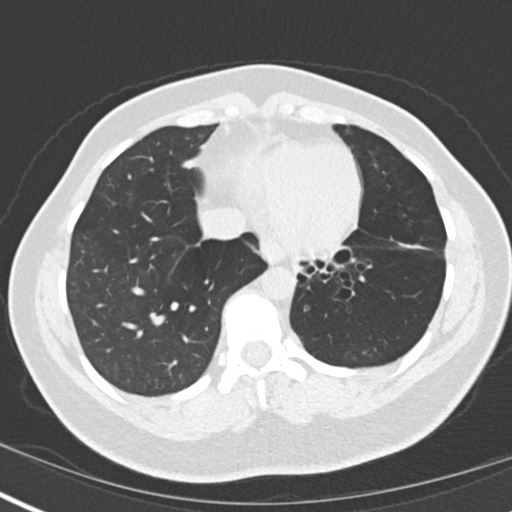
[im 62/166  mediastinal]
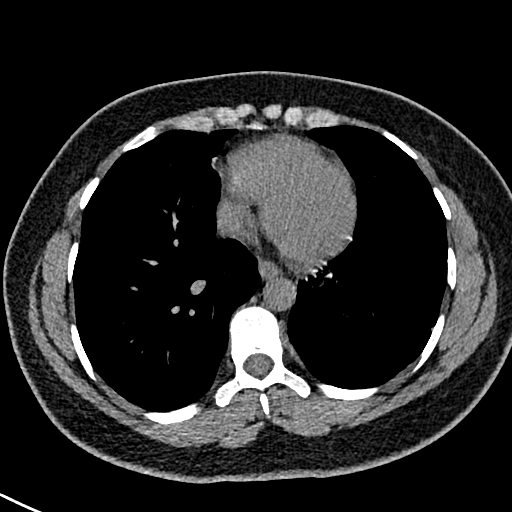
[im 62/166  lung]
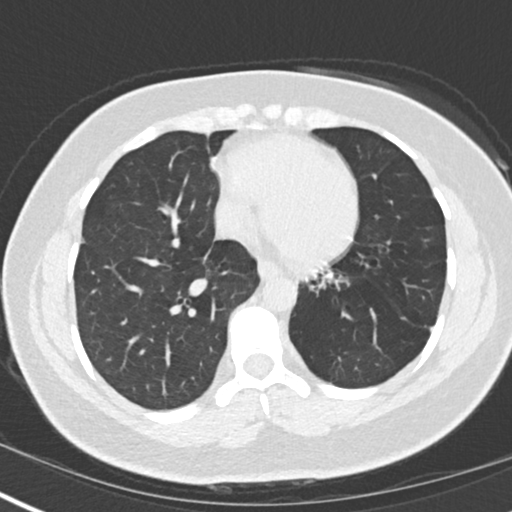
[im 74/166  lung]
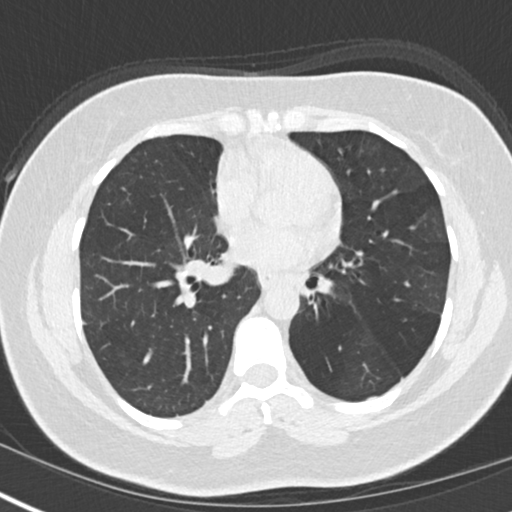
[im 92/166  lung]
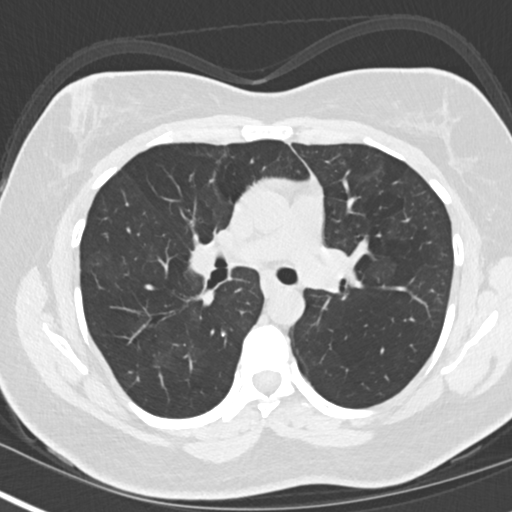
[im 104/166  lung]
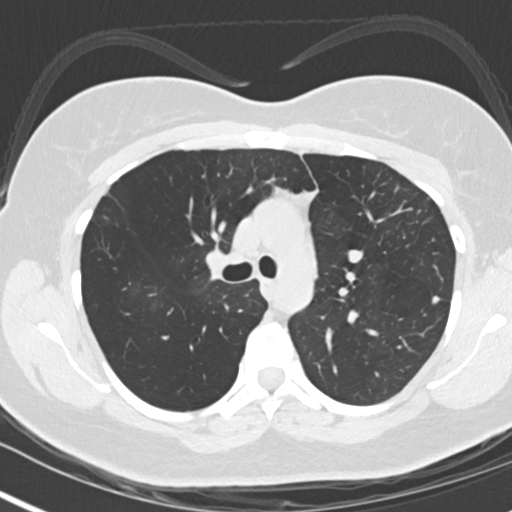
[im 117/166  mediastinal]
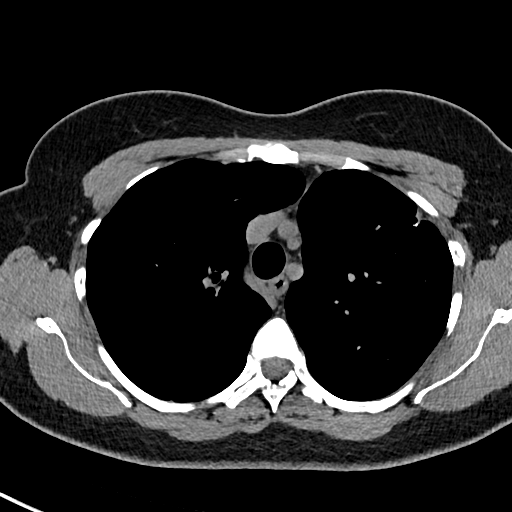
[im 117/166  lung]
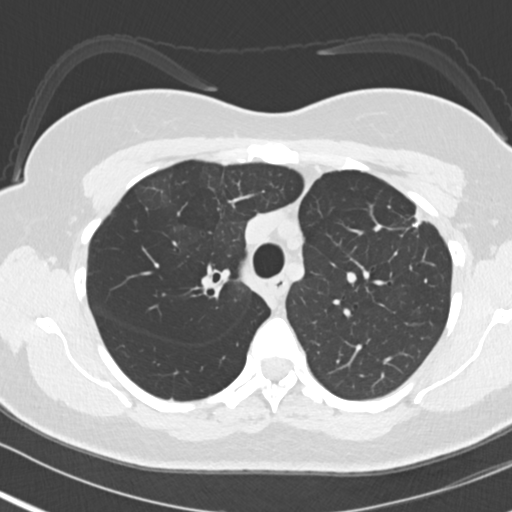
[im 129/166  lung]
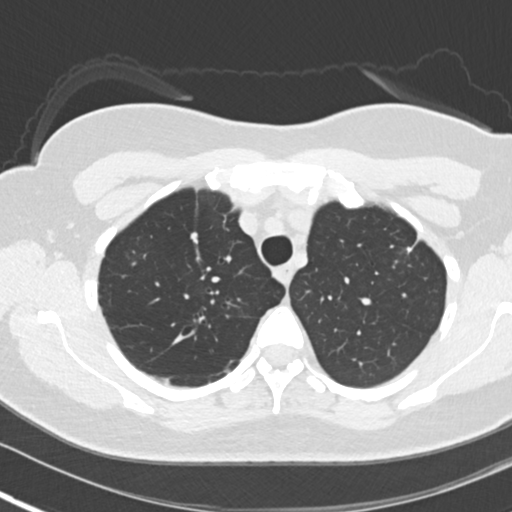
[im 141/166  lung]
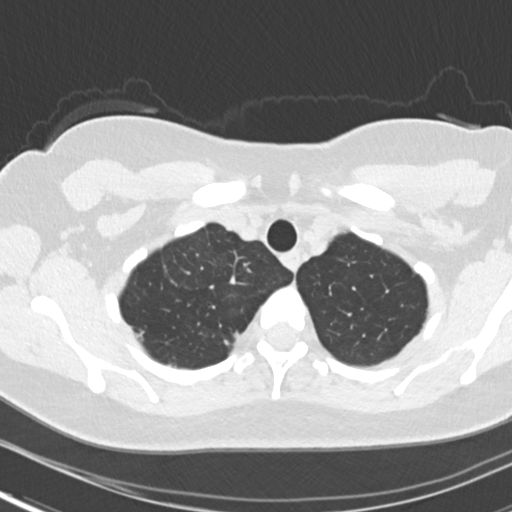
[im 153/166  lung]
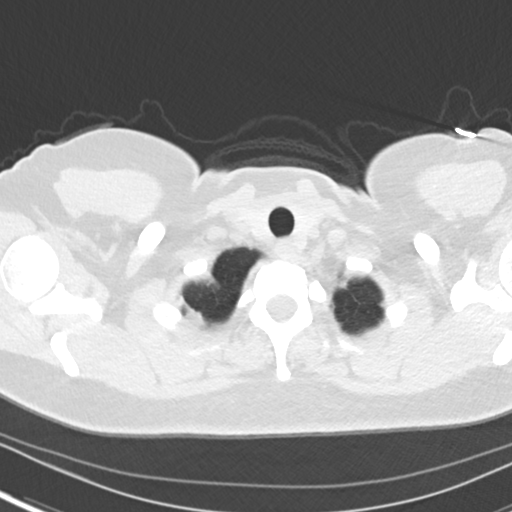

[Series 5: coronal · coronal · 0.54mm/px · 3 of 110 slices shown]
[im 22/110  lung]
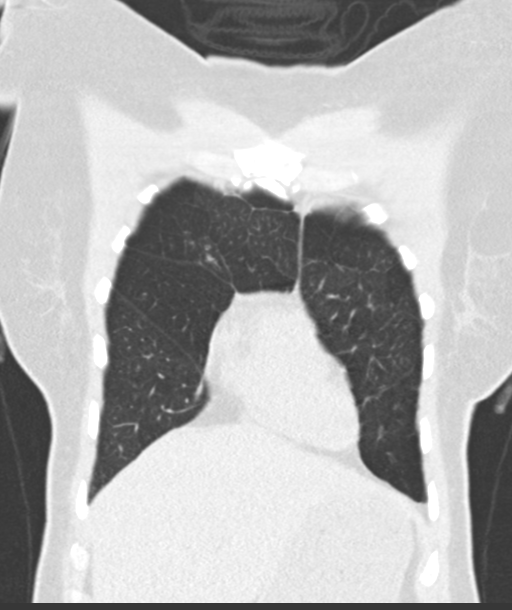
[im 44/110  lung]
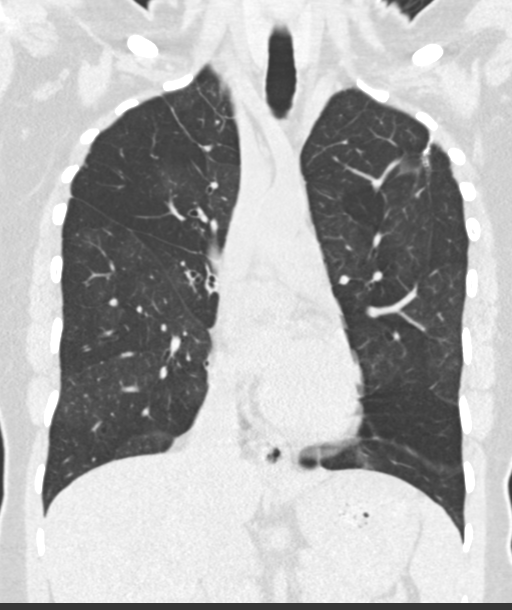
[im 66/110  lung]
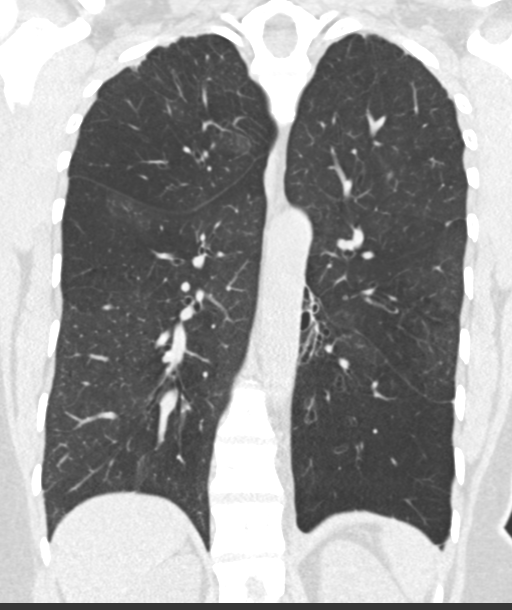

[15 of 36 positions shown; findings below may reference images not displayed]

FINDINGS: Cardiovascular: Normal heart size. No significant pericardial
fluid/thickening. Great vessels are normal in course and caliber.

Mediastinum/Nodes: No discrete thyroid nodules. Unremarkable
esophagus. No pathologically enlarged axillary, mediastinal or gross
hilar lymph nodes, noting limited sensitivity for the detection of
hilar adenopathy on this noncontrast study.

Lungs/Pleura: No pneumothorax. No pleural effusion. Mosaic
attenuation throughout both lungs appears unchanged, due to
extensive air trapping as seen on the prior high-resolution chest
CT. There is patchy moderate to severe cylindrical and varicoid
bronchiectasis throughout both lungs, asymmetrically prominent in
the basilar right upper lobe, right middle lobe and medial left
lower lobe, with associated bronchial wall thickening and scattered
mucoid impaction, not appreciably changed in severity. Scattered
solid pulmonary nodules in both lungs in centrilobular and
subpleural locations measuring up to the 8 mm in the right upper
lobe (series 3/ image 56), 8 mm in the posterior right lower lobe
(series 3/ image 88) and 5 mm in the left upper lobe (series 3/
image 63), all stable since [DATE], considered benign. No acute
consolidative airspace disease, lung masses or new significant
pulmonary nodules. Stable small bandlike opacities at the areas of
bronchiectasis with associated distortion and mild volume loss,
compatible with postinfectious/ postinflammatory scarring.

Upper abdomen: Unremarkable.

Musculoskeletal:  No aggressive appearing focal osseous lesions.
IMPRESSION: 1. Stable scattered centrilobular and subpleural pulmonary nodules
measuring up to 8 mm, for which 8 month stability has been
demonstrated, considered benign.
2. No acute disease in the chest.
3. Stable asymmetrically distributed regions of moderate to severe
cylindrical and varicoid bronchiectasis throughout both lungs.
Stable associated mild regions of postinfectious/postinflammatory
scarring.
4. Stable mosaic attenuation throughout both lungs due to extensive
air trapping as seen on the prior high-resolution chest CT.

## 2016-03-21 NOTE — Progress Notes (Signed)
Patient returned call.  Advised of lab results / recs as stated by TP.  Pt verbalized understanding and denied any questions.  Appt scheduled with PM 3.15.18 @ 1015.

## 2016-03-24 ENCOUNTER — Ambulatory Visit (INDEPENDENT_AMBULATORY_CARE_PROVIDER_SITE_OTHER): Payer: Medicare HMO | Admitting: Pulmonary Disease

## 2016-03-24 ENCOUNTER — Encounter: Payer: Self-pay | Admitting: Pulmonary Disease

## 2016-03-24 ENCOUNTER — Other Ambulatory Visit (INDEPENDENT_AMBULATORY_CARE_PROVIDER_SITE_OTHER): Payer: Medicare HMO

## 2016-03-24 VITALS — BP 116/70 | HR 106 | Ht 67.0 in | Wt 172.8 lb

## 2016-03-24 DIAGNOSIS — T8059XA Anaphylactic reaction due to other serum, initial encounter: Secondary | ICD-10-CM | POA: Diagnosis not present

## 2016-03-24 DIAGNOSIS — J471 Bronchiectasis with (acute) exacerbation: Secondary | ICD-10-CM

## 2016-03-24 LAB — SEDIMENTATION RATE: Sed Rate: 40 mm/hr — ABNORMAL HIGH (ref 0–20)

## 2016-03-24 NOTE — Patient Instructions (Signed)
We will check blood work for work up of cystic fibrosis We will schedule you for a bronchoscopy You will need a smart vest as the flutter valve is not working effectively  Return in 1-2 months.

## 2016-03-24 NOTE — Progress Notes (Signed)
Hannah Barron    [id number]    [birth year]  Primary Care Physician:Calone, Belenda Cruise, [place]  Referring Physician: Golden Circle, [place],  [id number]  Chief complaint:  Follow up for bronciectasis  HPI: 46 year old with chronic obstructive lung disease, bronchiectasis. She was previously followed at [place] with a VATS biopsy in 2003 and was told she had cystic bronchiectasis ? CF. She had followed up with a pulmonologist at [facility] but has moved to [place] on 2017  She reports intermittent symptoms of cough with mucus production. She denies any fevers, chills. She continues on her inhalers including Symbicort and albuterol rescue inhaler. She is an immigrant from [place] in 1998 and does not report any exposure to tuberculosis. She does not recall getting a BCG vaccine as a child.  Outpatient Encounter Prescriptions as of 03/24/2016  Medication Sig  . albuterol (VENTOLIN HFA) 108 (90 Base) MCG/ACT inhaler Inhale 2 puffs into the lungs every 6 (six) hours as needed for wheezing or shortness of breath.  . budesonide-formoterol (SYMBICORT) 160-4.5 MCG/ACT inhaler Inhale 2 puffs into the lungs 2 (two) times daily.  . cetirizine (ZYRTEC) 10 MG tablet Take 1 tablet (10 mg total) by mouth daily as needed for allergies.  Marland Kitchen ibuprofen (ADVIL,MOTRIN) 600 MG tablet Take 1 tablet (600 mg total) by mouth every 6 (six) hours as needed for moderate pain. (Patient taking differently: Take 600 mg by mouth every 6 (six) hours as needed for moderate pain. Pain)  . metoprolol succinate (TOPROL-XL) 50 MG 24 hr tablet Take 1 tablet (50 mg total) by mouth daily. Take with or immediately following a meal.  . omeprazole (PRILOSEC) 40 MG capsule Take 40 mg by mouth daily.  Marland Kitchen Respiratory Therapy Supplies (FLUTTER) DEVI Use as directed.  . RESTASIS 0.05 % ophthalmic emulsion INSTILL 1 DROP IN EACH EYE TWICE DAILY.  . [DISCONTINUED] albuterol (PROVENTIL HFA;VENTOLIN HFA) 108 (90  Base) MCG/ACT inhaler Inhale 2 puffs into the lungs every 6 (six) hours as needed for wheezing or shortness of breath.  . [DISCONTINUED] levalbuterol (XOPENEX HFA) 45 MCG/ACT inhaler Inhale 1-2 puffs into the lungs every 6 (six) hours as needed for wheezing.  . [DISCONTINUED] OMEPRAZOLE PO Take 1 tablet by mouth 2 (two) times daily.   No facility-administered encounter medications on file as of 03/24/2016.     Allergies as of 03/24/2016  . (No Known Allergies)    Past Medical History:  Diagnosis Date  . Bronchiectasis ([place])   . COPD (chronic obstructive pulmonary disease) ([place])   . Migraines   . Paroxysmal SVT (supraventricular tachycardia) ([place])    a. diagnosed in 11/2015.    Past Surgical History:  Procedure Laterality Date  . CESAREAN SECTION    . LUNG BIOPSY      Family History  Problem Relation Age of Onset  . Healthy Mother   . Healthy Father     Social History   Social History  . Marital status: Married    Spouse name: N/A  . Number of children: 1  . Years of education: 50   Occupational History  . disability    Social History Main Topics  . Smoking status: Never Smoker  . Smokeless tobacco: Never Used  . Alcohol use No  . Drug use: No  . Sexual activity: Yes   Other Topics Concern  . Not on file   Social History Narrative   Fun: Watch TV    Review  of systems: Review of Systems  Constitutional: Negative for fever and chills.  HENT: Negative.   Eyes: Negative for blurred vision.  Respiratory: as per HPI  Cardiovascular: Negative for chest pain and palpitations.  Gastrointestinal: Negative for vomiting, diarrhea, blood per rectum. Genitourinary: Negative for dysuria, urgency, frequency and hematuria.  Musculoskeletal: Negative for myalgias, back pain and joint pain.  Skin: Negative for itching and rash.  Neurological: Negative for dizziness, tremors, focal weakness, seizures and loss of consciousness.  Endo/Heme/Allergies: Negative for  environmental allergies.  Psychiatric/Behavioral: Negative for depression, suicidal ideas and hallucinations.  All other systems reviewed and are negative.  Physical Exam: Blood pressure 116/70, pulse (!) 106, height _0  (1.702 m), weight 172 lb 12.8 oz (78.4 kg), SpO2 98 %. Gen:      No acute distress HEENT:  EOMI, sclera anicteric Neck:     No masses; no thyromegaly Lungs:    Clear to auscultation bilaterally; normal respiratory effort CV:         Regular rate and rhythm; no murmurs Abd:      + bowel sounds; soft, non-tender; no palpable masses, no distension Ext:    No edema; adequate peripheral perfusion Skin:      Warm and dry; no rash Neuro: alert and oriented x 3 Psych: normal mood and affect  Data Reviewed: A1AT 03/16/16- 161, PIMM Quantiferon 03/16/16- Positive  Labs 03/24/16 IgG 1477 IgA 134 IgM 168 IgE 6 ANA, CCP, RA- negative  CT high resolution 07/16/15-areas of cystic and varicose bronchiectasis, extensive air trapping, multiple pulmonary nodules. CT chest 03/21/16-areas of bronchiectasis, at trapping and pulmonary nodules. Unchanged compared to 2017 I have reviewed all images personally.  Myocardial perfusion study 12/2015 >neg  Ischemia , EF 55% Echo 10/2015 >EF 65%.  PFT 06/2015 FEV1 60%, ratio 63, FVC 77%,   Assessment:  Follow-up for bronchiectasis. She continues to have intermittent symptoms of cough, dyspnea, wheezing. She was started on a flutter valve at last visit but does not appear to be effective. We will assess for smart vest. She continues on Symbicort inhaler.  Her workup including alpha-1 antitrypsin levels and immunoglobulins are normal. She had a cystic fibrosis panel ordered at last visit but hasn't been done yet. QuantiFERON test noted to be positive however suspicion for tuberculosis infection is low. She may have MAI infection but is unable to produce an adequate sputum specimen. We will schedule for a bronchoscopy with BAL for further evaluation.  Risk benefits of the procedure were discussed with the patient and she is agreeable to proceed.  Pulmonary clearance for hysterectomy She has been cleared to undergo the procedure for hysterectomy on 3/9. This has been scheduled for later this month. She is at moderate pulmonary risk due to underlying lung disease. Recommend early mobilization, pulmonary toilet with supplemental oxygen, incentive spirometry and inhalers.   Plan/Recommendations: - Continue symbicort, albuterol - Get smart vest for clearence of secretions. - Check CF panel. Get records from pulmonologist at [facility] for bronch with BAL  Marshell Garfinkel MD [facility] Pager [phone] 03/24/2016, 11:00 AM  CC: Golden Circle, FNP

## 2016-03-25 LAB — RHEUMATOID FACTOR: Rhuematoid fact SerPl-aCnc: <14 IU/mL (ref <14)

## 2016-03-25 LAB — CYCLIC CITRUL PEPTIDE ANTIBODY, IGG: Cyclic Citrullin Peptide Ab: <16 Units

## 2016-03-28 ENCOUNTER — Encounter (HOSPITAL_COMMUNITY): Payer: Self-pay

## 2016-03-28 ENCOUNTER — Encounter (HOSPITAL_COMMUNITY)
Admission: RE | Admit: 2016-03-28 | Discharge: 2016-03-28 | Disposition: A | Discharge status: Home / Self Care | Payer: Medicare HMO | Source: Ambulatory Visit | Attending: Obstetrics & Gynecology | Admitting: Obstetrics & Gynecology

## 2016-03-28 ENCOUNTER — Ambulatory Visit (HOSPITAL_COMMUNITY): Admit: 2016-03-28 | Payer: Medicare HMO | Admitting: Pulmonary Disease

## 2016-03-28 DIAGNOSIS — R918 Other nonspecific abnormal finding of lung field: Secondary | ICD-10-CM | POA: Diagnosis not present

## 2016-03-28 DIAGNOSIS — J479 Bronchiectasis, uncomplicated: Secondary | ICD-10-CM | POA: Diagnosis present

## 2016-03-28 DIAGNOSIS — I471 Supraventricular tachycardia: Secondary | ICD-10-CM | POA: Diagnosis not present

## 2016-03-28 DIAGNOSIS — Z79899 Other long term (current) drug therapy: Secondary | ICD-10-CM | POA: Diagnosis not present

## 2016-03-28 DIAGNOSIS — Z7951 Long term (current) use of inhaled steroids: Secondary | ICD-10-CM | POA: Diagnosis not present

## 2016-03-28 HISTORY — DX: Gastro-esophageal reflux disease without esophagitis: K21.9

## 2016-03-28 HISTORY — DX: Anemia, unspecified: D64.9

## 2016-03-28 HISTORY — DX: Anesthesia of skin: R20.0

## 2016-03-28 LAB — ASPERGILLUS IGE PANEL
Aspergillus Amstel/glauc IgE: <0.35 kU/L (ref <0.35)
Aspergillus IgE Panel: <0.1 kU/L (ref <0.35)
CLASS: 0
CLASS: 0
CLASS: 0
CLASS: 0
CLASS: 0
Class: 0
Class: 0
Results Received-AspIgE: <0.1 kU/L (ref <0.35)

## 2016-03-28 LAB — IGG SUBCLASSES
IGG SUBCLASS 4: 10.1 mg/dL (ref 4.0–86.0)
IgG Subclass 1: 578 mg/dL (ref 382–929)
IgG Subclass 2: 601 mg/dL (ref 241–700)
IgG Subclass 3: 48 mg/dL (ref 22–178)

## 2016-03-28 LAB — CYSTIC FIBROSIS MUTATION 97: Interpretation: NOT DETECTED

## 2016-03-28 LAB — CBC
HCT: 38.6 % (ref 36.0–46.0)
Hemoglobin: 13.2 g/dL (ref 12.0–15.0)
MCH: 31.7 pg (ref 26.0–34.0)
MCHC: 34.2 g/dL (ref 30.0–36.0)
MCV: 92.6 fL (ref 78.0–100.0)
PLATELETS: 296 10*3/uL (ref 150–400)
RBC: 4.17 MIL/uL (ref 3.87–5.11)
RDW: 13 % (ref 11.5–15.5)
WBC: 6.2 10*3/uL (ref 4.0–10.5)

## 2016-03-28 LAB — BASIC METABOLIC PANEL
Anion gap: 9 (ref 5–15)
BUN: 12 mg/dL (ref 6–20)
CALCIUM: 9.3 mg/dL (ref 8.9–10.3)
CO2: 23 mmol/L (ref 22–32)
CREATININE: 0.71 mg/dL (ref 0.44–1.00)
Chloride: 106 mmol/L (ref 101–111)
GFR calc non Af Amer: >60 mL/min (ref >60)
Glucose, Bld: 107 mg/dL — ABNORMAL HIGH (ref 65–99)
Potassium: 3.4 mmol/L — ABNORMAL LOW (ref 3.5–5.1)
SODIUM: 138 mmol/L (ref 135–145)

## 2016-03-28 LAB — TYPE AND SCREEN
ABO/RH(D): O POS
Antibody Screen: NEGATIVE

## 2016-03-28 LAB — ABO/RH: ABO/RH(D): O POS

## 2016-03-28 SURGERY — VIDEO BRONCHOSCOPY WITHOUT FLUORO
Anesthesia: Moderate Sedation | Laterality: Bilateral

## 2016-03-28 NOTE — Patient Instructions (Signed)
Your procedure is scheduled on:  Tuesday, April 05, 2016  Enter through the Main Entrance of [facility] at:  6:00 AM  Pick up the phone at the desk and dial [phone].  Call this number if you have problems the morning of surgery: [phone].  Remember: Do NOT eat food or drink after:  Midnight Monday  Take these medicines the morning of surgery with a SIP OF WATER:  Metoprolol, Omeprazole, Use asthma inhalers per normal routine  Bring Rescue Asthma inhaler day of surgery  Stop ALL herbal medications and Ibuprofen at this time  Do NOT smoke the day of surgery.  Do NOT wear jewelry (body piercing), metal hair clips/bobby pins, make-up, or nail polish. Do NOT wear lotions, powders, or perfumes.  You may wear deodorant. Do NOT shave for 48 hours prior to surgery. Do NOT bring valuables to the hospital. Contacts, dentures, or bridgework may not be worn into surgery.  Leave suitcase in car.  After surgery it may be brought to your room.  For patients admitted to the hospital, checkout time is 11:00 AM the day of discharge.  Bring a copy of your healthcare power of attorney and living will documents.

## 2016-03-29 ENCOUNTER — Encounter (HOSPITAL_COMMUNITY)
Admission: RE | Disposition: A | Discharge status: Home / Self Care | Payer: Self-pay | Source: Ambulatory Visit | Attending: Pulmonary Disease

## 2016-03-29 ENCOUNTER — Ambulatory Visit (HOSPITAL_COMMUNITY): Payer: Medicare HMO

## 2016-03-29 ENCOUNTER — Ambulatory Visit (HOSPITAL_COMMUNITY)
Admission: RE | Admit: 2016-03-29 | Discharge: 2016-03-29 | Disposition: A | Discharge status: Home / Self Care | Payer: Medicare HMO | Source: Ambulatory Visit | Attending: Pulmonary Disease | Admitting: Pulmonary Disease

## 2016-03-29 ENCOUNTER — Encounter (HOSPITAL_COMMUNITY): Payer: Self-pay | Admitting: Respiratory Therapy

## 2016-03-29 DIAGNOSIS — I471 Supraventricular tachycardia: Secondary | ICD-10-CM | POA: Diagnosis not present

## 2016-03-29 DIAGNOSIS — Z7951 Long term (current) use of inhaled steroids: Secondary | ICD-10-CM | POA: Diagnosis not present

## 2016-03-29 DIAGNOSIS — Z79899 Other long term (current) drug therapy: Secondary | ICD-10-CM | POA: Diagnosis not present

## 2016-03-29 DIAGNOSIS — R918 Other nonspecific abnormal finding of lung field: Secondary | ICD-10-CM | POA: Diagnosis not present

## 2016-03-29 DIAGNOSIS — J479 Bronchiectasis, uncomplicated: Secondary | ICD-10-CM | POA: Diagnosis not present

## 2016-03-29 DIAGNOSIS — R938 Abnormal findings on diagnostic imaging of other specified body structures: Secondary | ICD-10-CM | POA: Diagnosis not present

## 2016-03-29 DIAGNOSIS — Z9889 Other specified postprocedural states: Secondary | ICD-10-CM

## 2016-03-29 HISTORY — PX: VIDEO BRONCHOSCOPY: SHX5072

## 2016-03-29 LAB — BODY FLUID CELL COUNT WITH DIFFERENTIAL
LYMPHS FL: 26 %
MONOCYTE-MACROPHAGE-SEROUS FLUID: 24 % — AB (ref 50–90)
NEUTROPHIL FLUID: 50 % — AB (ref 0–25)
Total Nucleated Cell Count, Fluid: 26 cu mm (ref 0–1000)

## 2016-03-29 LAB — CYSTIC FIBROSIS MUTATION 97: GENE DIS ANAL CARRIER INTERP BLD/T-IMP: NOT DETECTED

## 2016-03-29 LAB — ANA W/REFLEX: Anti Nuclear Antibody(ANA): NEGATIVE

## 2016-03-29 IMAGING — DX DG CHEST 1V PORT
2 series · 2 of 2 positions shown · non-contrast
Comparison: [DATE]

CLINICAL DATA: Post bilateral Long washings, bronchoalveolar
lavage.

EXAM:
PORTABLE CHEST 1 VIEW

[chest ap (1 of 2)]
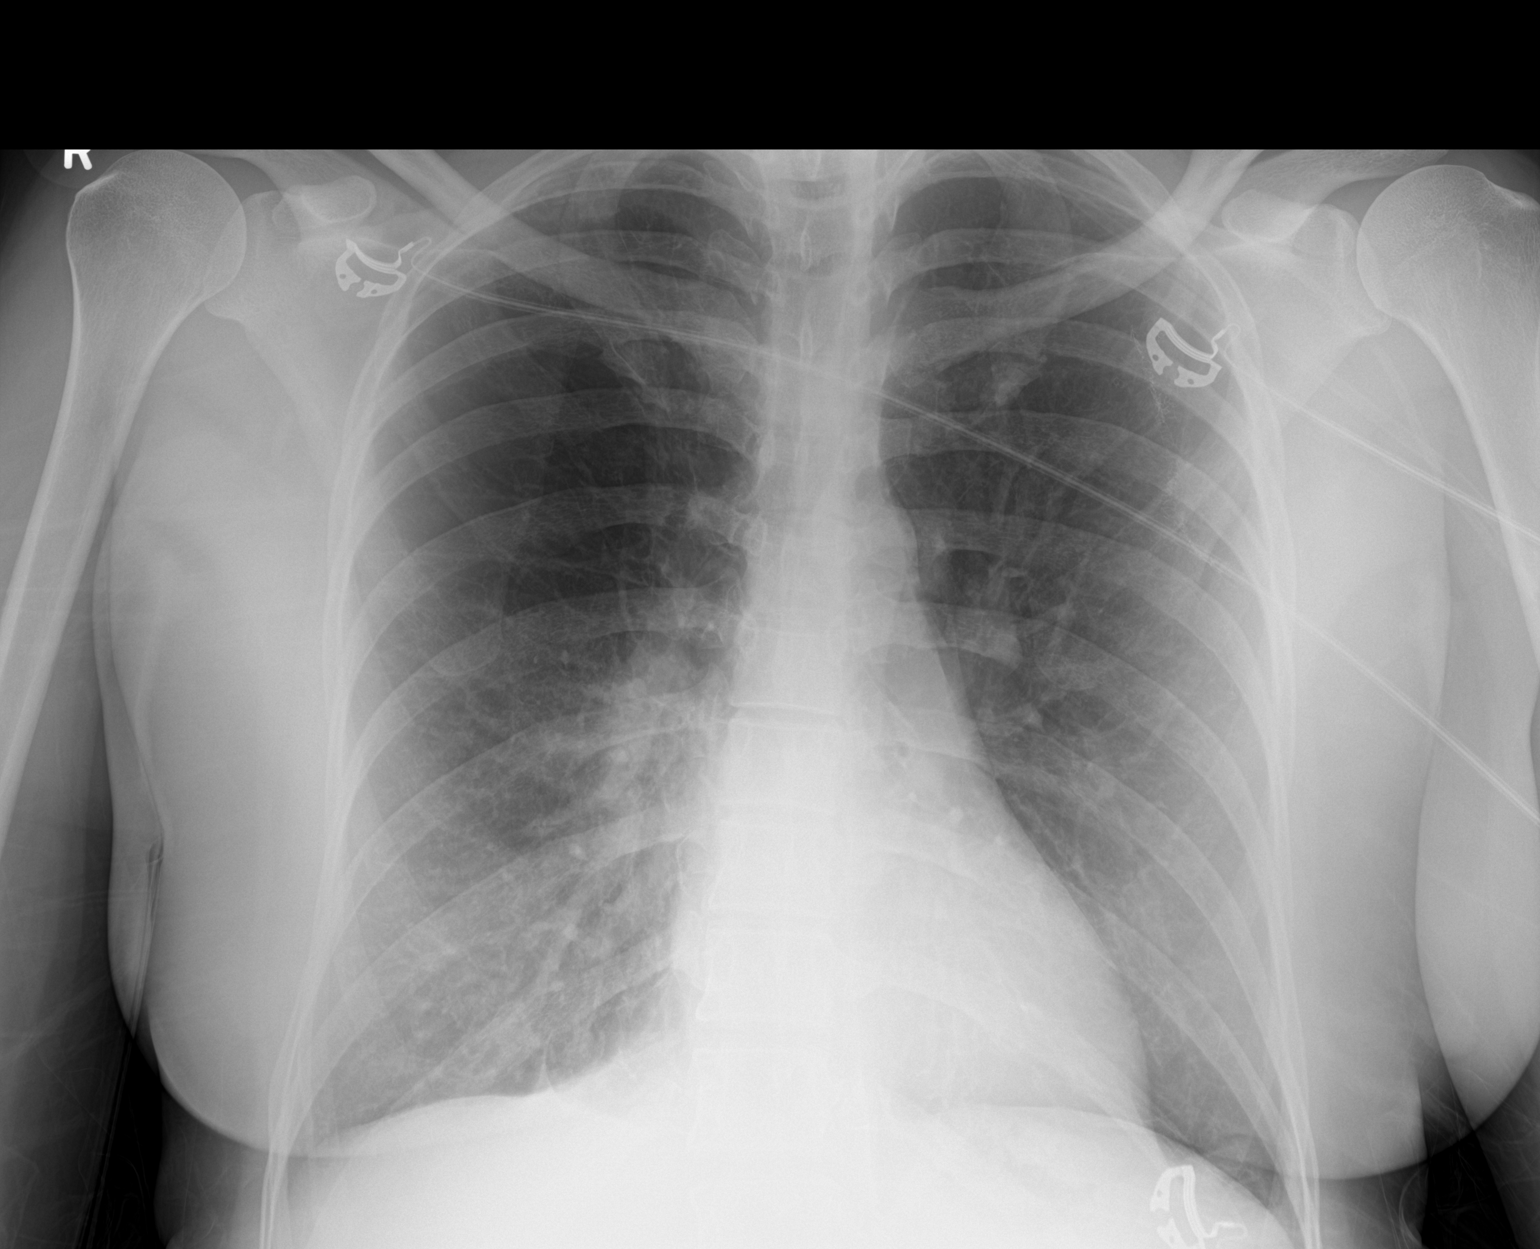

[chest ap (2 of 2)]
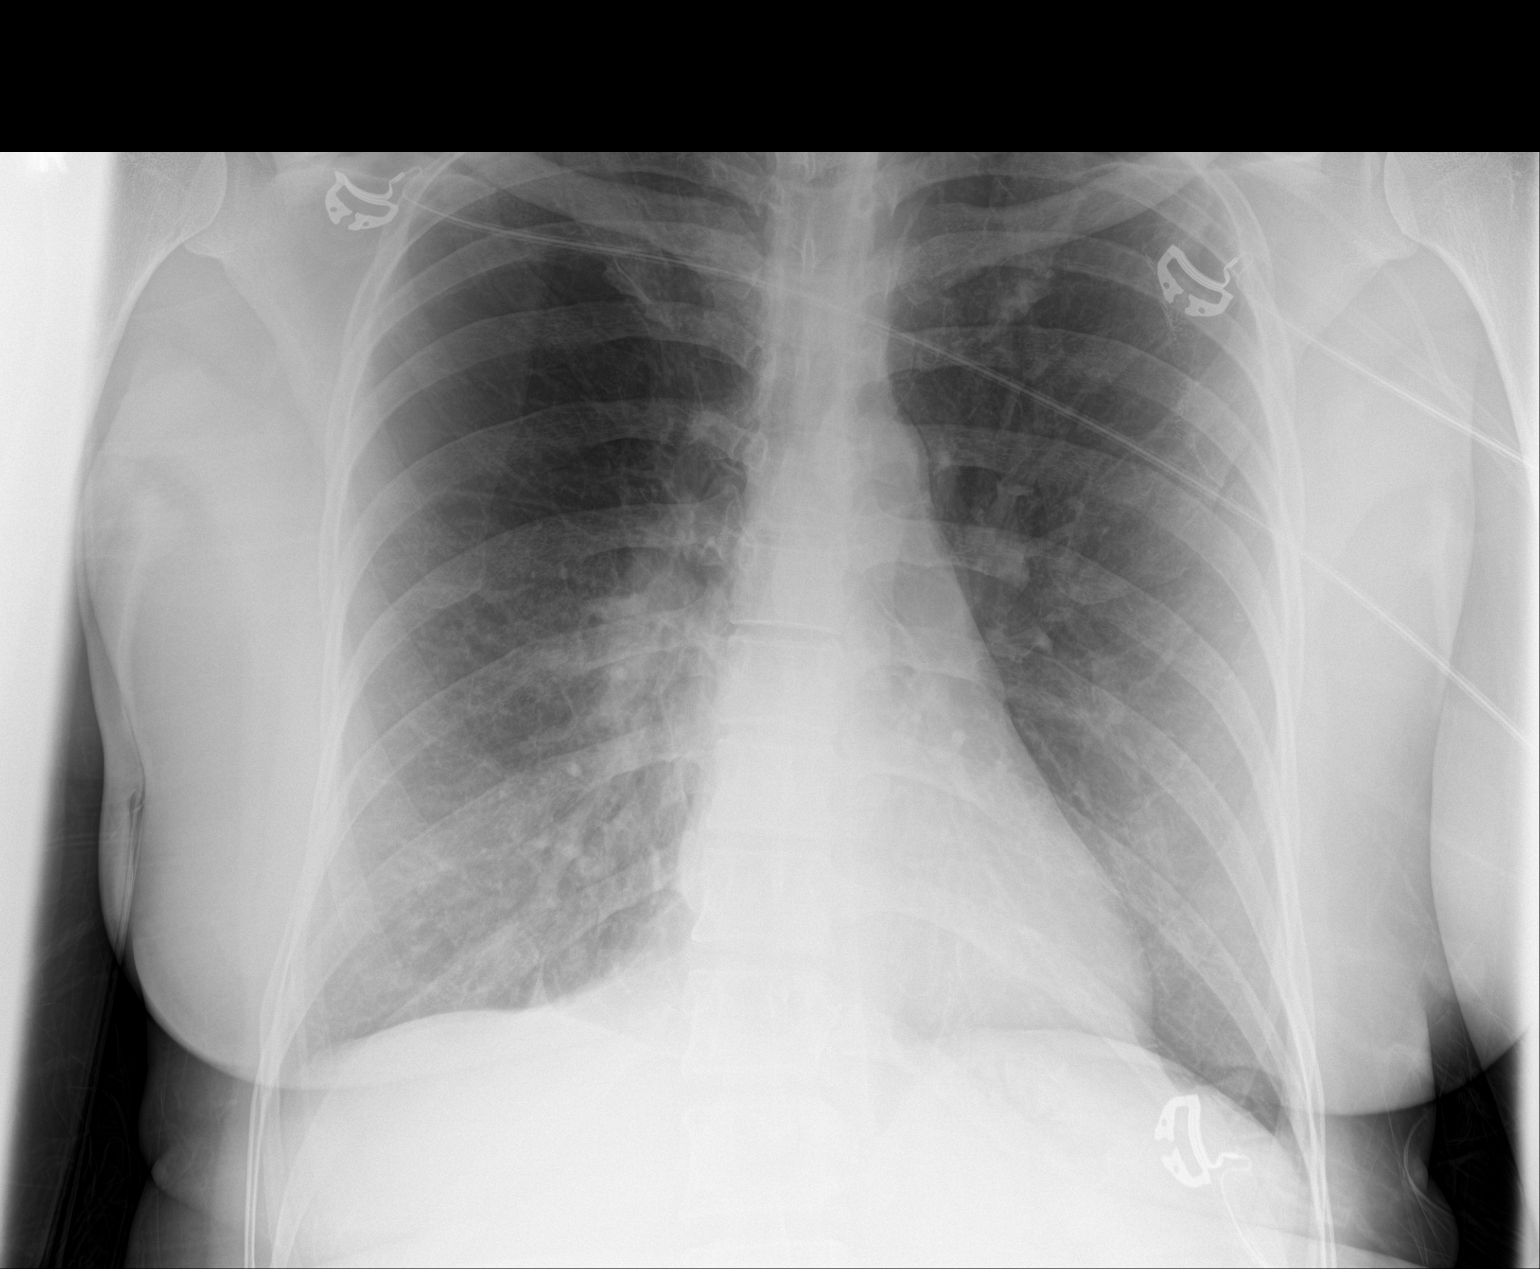

[2 of 2 positions shown; findings below may reference images not displayed]

FINDINGS: Hazy perihilar opacities likely rib related to the bronchoalveolar
lavage. No pneumothorax. Heart is normal size. No effusions or acute
bony abnormality.
IMPRESSION: No pneumothorax. Mild hazy perihilar opacities likely related to the
bronchoalveolar lavage.

## 2016-03-29 SURGERY — VIDEO BRONCHOSCOPY WITHOUT FLUORO
Anesthesia: Moderate Sedation | Laterality: Bilateral

## 2016-03-29 MED ORDER — SODIUM CHLORIDE 0.9 % IV SOLN
INTRAVENOUS | Status: DC
  Administered 2016-03-29: 09:00:00 via INTRAVENOUS

## 2016-03-29 MED ORDER — BUTAMBEN-TETRACAINE-BENZOCAINE 2-2-14 % EX AERO: 1.0000 | INHALATION_SPRAY | Freq: Once | CUTANEOUS | Status: DC

## 2016-03-29 MED ORDER — LIDOCAINE HCL 1 % IJ SOLN
INTRAMUSCULAR | Status: DC | PRN
  Administered 2016-03-29: 6 mL via RESPIRATORY_TRACT

## 2016-03-29 MED ORDER — LIDOCAINE HCL 2 % EX GEL
CUTANEOUS | Status: DC | PRN
  Administered 2016-03-29: 1

## 2016-03-29 MED ORDER — PHENYLEPHRINE HCL 0.25 % NA SOLN
NASAL | Status: DC | PRN
  Administered 2016-03-29: 2 via NASAL

## 2016-03-29 MED ORDER — LIDOCAINE HCL 2 % EX GEL: 1.0000 "application " | Freq: Once | CUTANEOUS | Status: DC

## 2016-03-29 MED ORDER — MIDAZOLAM HCL 5 MG/ML IJ SOLN
INTRAMUSCULAR | Status: AC
  Filled 2016-03-29: qty 2

## 2016-03-29 MED ORDER — FENTANYL CITRATE (PF) 100 MCG/2ML IJ SOLN
INTRAMUSCULAR | Status: DC | PRN
  Administered 2016-03-29 (×4): 25 ug via INTRAVENOUS

## 2016-03-29 MED ORDER — PHENYLEPHRINE HCL 0.25 % NA SOLN: 1.0000 | Freq: Four times a day (QID) | NASAL | Status: DC | PRN

## 2016-03-29 MED ORDER — MIDAZOLAM HCL 10 MG/2ML IJ SOLN
INTRAMUSCULAR | Status: DC | PRN
  Administered 2016-03-29 (×4): 1 mg via INTRAVENOUS

## 2016-03-29 MED ORDER — FENTANYL CITRATE (PF) 100 MCG/2ML IJ SOLN
INTRAMUSCULAR | Status: AC
  Filled 2016-03-29: qty 4

## 2016-03-29 NOTE — Progress Notes (Signed)
Video Bronchoscopy done  Intervention Bronchial washing done 

## 2016-03-29 NOTE — Discharge Instructions (Signed)
Flexible Bronchoscopy, Care After These instructions give you information on caring for yourself after your procedure. Your doctor may also give you more specific instructions. Call your doctor if you have any problems or questions after your procedure. Follow these instructions at home:  Do not eat or drink anything for 2 hours after your procedure. If you try to eat or drink before the medicine wears off, food or drink could go into your lungs. You could also burn yourself.  After 2 hours have passed and when you can cough and gag normally, you may eat soft food and drink liquids slowly.  The day after the test, you may eat your normal diet.  You may do your normal activities.  Keep all doctor visits. Get help right away if:  You get more and more short of breath.  You get light-headed.  You feel like you are going to pass out (faint).  You have chest pain.  You have new problems that worry you.  You cough up more than a little blood.  You cough up more blood than before. This information is not intended to replace advice given to you by your health care provider. Make sure you discuss any questions you have with your health care provider. Document Released: 10/24/2008 Document Revised: 06/04/2015 Document Reviewed: 08/31/2012 Elsevier Interactive Patient Education  2017 [place].   Nothing to eat or drink until   12:00 nooon   Today 03/29/2016

## 2016-03-29 NOTE — Op Note (Signed)
[facility] Cardiopulmonary Patient Name: Hannah Barron Procedure Date: 03/29/2016 MRN: [id number] Attending MD: Marshell Garfinkel , MD Date of Birth: 1970-07-18 CSN: [id number] Age: 46 Admit Type: Outpatient Ethnicity: Not Hispanic or Latino Procedure:            Bronchoscopy Indications:          Abnormal CT scan of chest, Bronchiectasis, r/o TB Providers:            Marshell Garfinkel, MD, Andre Lefort RRT,RCP, Ashley Mariner RRT,RCP Referring MD:          Medicines:            Midazolam 4 mg IV, Fentanyl 443 mcg IV Complications:        No immediate complications Estimated Blood Loss: Estimated blood loss: none. Procedure:      Pre-Anesthesia Assessment:      - A History and Physical has been performed. Patient meds and allergies       have been reviewed. The risks and benefits of the procedure and the       sedation options and risks were discussed with the patient. All       questions were answered and informed consent was obtained. Patient       identification and proposed procedure were verified prior to the       procedure by the physician. Mental Status Examination: alert and       oriented. Airway Examination: normal oropharyngeal airway. Respiratory       Examination: clear to auscultation. CV Examination: RRR, no murmurs, no       S3 or S4. ASA Grade Assessment: I - A normal healthy patient. After       reviewing the risks and benefits, the patient was deemed in satisfactory       condition to undergo the procedure. The anesthesia plan was to use       moderate sedation / analgesia (conscious sedation). Immediately prior to       administration of medications, the patient was re-assessed for adequacy       to receive sedatives. The heart rate, respiratory rate, oxygen       saturations, blood pressure, adequacy of pulmonary ventilation, and       response to care were monitored throughout the procedure. The physical       status of the  patient was re-assessed after the procedure.      After obtaining informed consent, the bronchoscope was passed under       direct vision. Throughout the procedure, the patient's blood pressure,       pulse, and oxygen saturations were monitored continuously. the [id number]       [id number] scope was introduced through the left nostril and advanced to       the tracheobronchial tree of both lungs. The procedure was accomplished       with ease. The procedure was accomplished with ease. The patient       tolerated the procedure well. The total duration of the procedure was 1       hour and 15 minutes. Findings:      The nasopharynx/oropharynx appears normal. The larynx appears normal.       The vocal cords appear normal. The subglottic space is normal. The       trachea is of normal caliber.  The carina is sharp. The tracheobronchial       tree was examined to at least the first subsegmental level. Bronchial       mucosa and anatomy are normal; there are no endobronchial lesions, and       no secretions.      Bronchoalveolar lavage was performed in the RML lateral segment (B4) of       the lung and sent for cell count, bacterial culture, viral smears &       culture, and fungal & AFB analysis and cytology. 180 mL of fluid were       instilled. 180 mL were returned. The return was blood-tinged. There were       no mucoid plugs in the return fluid. Multiple specimens were obtained       and pooled into one specimen, which was sent for analysis. Impression:      - Abnormal CT scan of chest      - The examination was normal.      - Bronchoalveolar lavage was performed. Moderate Sedation:      Moderate (conscious) sedation was administered by the endoscopy nurse       and supervised by the endoscopist. The following parameters were       monitored: oxygen saturation, heart rate, blood pressure, respiratory       rate, EKG, adequacy of pulmonary ventilation, and response to care.       Total  physician intraservice time was 15 minutes. Recommendation:      - Await BAL results. Procedure Code(s):      --- Professional ---      [phone], Bronchoscopy, rigid or flexible, including fluoroscopic guidance,       when performed; with bronchial alveolar lavage      99152, Moderate sedation services provided by the same physician or       other qualified health care professional performing the diagnostic or       therapeutic service that the sedation supports, requiring the presence       of an independent trained observer to assist in the monitoring of the       patient's level of consciousness and physiological status; initial 15       minutes of intraservice time, patient age 15 years or older Diagnosis Code(s):      --- Professional ---      R93.8, Abnormal findings on diagnostic imaging of other specified body       structures CPT copyright 2016 American Medical Association. All rights reserved. The codes documented in this report are preliminary and upon coder review may  be revised to meet current compliance requirements. Marshell Garfinkel, MD 03/29/2016 10:36:18 AM Number of Addenda: 0 Scope In: 9:38:10 AM Scope Out: 1:75:10 AM

## 2016-03-30 ENCOUNTER — Encounter (HOSPITAL_COMMUNITY): Payer: Self-pay | Admitting: Pulmonary Disease

## 2016-03-30 DIAGNOSIS — J479 Bronchiectasis, uncomplicated: Secondary | ICD-10-CM | POA: Diagnosis not present

## 2016-03-30 LAB — PNEUMOCYSTIS JIROVECI SMEAR BY DFA: PNEUMOCYSTIS JIROVECI AG: POSITIVE

## 2016-03-30 LAB — ACID FAST SMEAR (AFB): ACID FAST SMEAR - AFSCU2: NEGATIVE

## 2016-03-30 LAB — ACID FAST SMEAR (AFB, MYCOBACTERIA)

## 2016-03-31 LAB — CULTURE, BAL-QUANTITATIVE

## 2016-03-31 LAB — CULTURE, BAL-QUANTITATIVE W GRAM STAIN
Culture: NO GROWTH
Special Requests: NORMAL

## 2016-03-31 NOTE — Progress Notes (Signed)
Additional information has not been received Called [facility] and spoke with Collie Siad - report will be faxed

## 2016-04-01 ENCOUNTER — Ambulatory Visit (INDEPENDENT_AMBULATORY_CARE_PROVIDER_SITE_OTHER): Payer: Medicare HMO | Admitting: Obstetrics & Gynecology

## 2016-04-01 ENCOUNTER — Encounter: Payer: Self-pay | Admitting: Obstetrics & Gynecology

## 2016-04-01 ENCOUNTER — Other Ambulatory Visit (HOSPITAL_COMMUNITY)
Admission: RE | Admit: 2016-04-01 | Discharge: 2016-04-01 | Disposition: A | Discharge status: Home / Self Care | Payer: Medicare HMO | Source: Ambulatory Visit | Attending: Obstetrics & Gynecology | Admitting: Obstetrics & Gynecology

## 2016-04-01 ENCOUNTER — Other Ambulatory Visit: Payer: Self-pay

## 2016-04-01 ENCOUNTER — Other Ambulatory Visit: Payer: Medicare HMO

## 2016-04-01 ENCOUNTER — Telehealth: Payer: Self-pay | Admitting: Pulmonary Disease

## 2016-04-01 VITALS — BP 105/57 | HR 83 | Wt 169.8 lb

## 2016-04-01 DIAGNOSIS — J479 Bronchiectasis, uncomplicated: Secondary | ICD-10-CM

## 2016-04-01 DIAGNOSIS — N939 Abnormal uterine and vaginal bleeding, unspecified: Secondary | ICD-10-CM

## 2016-04-01 DIAGNOSIS — Z01419 Encounter for gynecological examination (general) (routine) without abnormal findings: Secondary | ICD-10-CM | POA: Diagnosis present

## 2016-04-01 DIAGNOSIS — D251 Intramural leiomyoma of uterus: Secondary | ICD-10-CM

## 2016-04-01 NOTE — Telephone Encounter (Signed)
Labs ordered, pt aware. Nothing further needed.

## 2016-04-01 NOTE — Telephone Encounter (Signed)
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 I am sure if this a contaminant or false positive. It is possible that she may have undiagnosed HIV. I have called and informed the patient about this  She will come in today for HIV and beta D glucan test

## 2016-04-01 NOTE — Progress Notes (Signed)
Preoperative History and Physical  Hannah Barron is a 46 y.o. G1P1001 here for surgical management of AUB and uterine fibroids  Proposed surgery:  LAVH with bilateral salpingectomy  Past Medical History:  Diagnosis Date  . Anemia   . Bronchiectasis ([place])   . COPD (chronic obstructive pulmonary disease) ([place])   . GERD (gastroesophageal reflux disease)   . Migraines   . Paroxysmal SVT (supraventricular tachycardia) ([place])    a. diagnosed in 11/2015.  . Right leg numbness    Past Surgical History:  Procedure Laterality Date  . CESAREAN SECTION    . LUNG BIOPSY    . VIDEO BRONCHOSCOPY Bilateral 03/29/2016   Procedure: VIDEO BRONCHOSCOPY WITHOUT FLUORO;  Surgeon: Marshell Garfinkel, MD;  Location: [facility];  Service: Cardiopulmonary;  Laterality: Bilateral;   OB History    Gravida Para Term Preterm AB Living   _0 SAB TAB Ectopic Multiple Live Births                 Patient denies any cervical dysplasia or STIs.  (Not in a hospital admission)  No Known Allergies Social History:   reports that she has never smoked. She has never used smokeless tobacco. She reports that she does not drink alcohol or use drugs. Family History  Problem Relation Age of Onset  . Healthy Mother   . Healthy Father     Review of Systems: Noncontributory  PHYSICAL EXAM: Blood pressure (!) 105/57, pulse 83, weight 169 lb 12.8 oz (77 kg), last menstrual period 03/31/2016. General appearance - alert, well appearing, and in no distress Chest - clear to auscultation, no wheezes, rales or rhonchi, symmetric air entry Heart - normal rate and regular rhythm Abdomen - soft, nontender, nondistended, no masses or organomegaly. Well healed transverse incision Pelvic -s/p PAP today.  Uterus enlarged with a wide base. Pt has good descensus.   Extremities - peripheral pulses normal, no pedal edema, no clubbing or cyanosis  Labs: Results for orders placed or performed during the hospital encounter of  03/29/16 (from the past 336 hour(s))  Culture, bal-quantitative   Collection Time: 03/29/16  9:56 AM  Result Value Ref Range   Specimen Description BRONCHIAL ALVEOLAR LAVAGE    Special Requests Normal    Gram Stain      RARE WBC PRESENT,BOTH PMN AND MONONUCLEAR NO ORGANISMS SEEN    Culture      NO GROWTH 2 DAYS Performed at [facility], [place] [address], [place], [place] [address]    Report Status 03/31/2016 FINAL   Pneumocystis smear by DFA   Collection Time: 03/29/16  9:56 AM  Result Value Ref Range   Specimen Source-[facility] BRONCHIAL ALVEOLAR LAVAGE    Pneumocystis jiroveci Ag POSITIVE   Acid Fast Smear (AFB)   Collection Time: 03/29/16  9:56 AM  Result Value Ref Range   AFB Specimen Processing Concentration    Acid Fast Smear Negative    Source (AFB) BRONCHIAL ALVEOLAR LAVAGE   Body fluid cell count with differential   Collection Time: 03/29/16  9:56 AM  Result Value Ref Range   Fluid Type-FCT BRONCHIAL ALVEOLAR LAVAGE    Color, Fluid PINK (A) YELLOW   Appearance, Fluid CLOUDY (A) CLEAR   WBC, Fluid 26 0 - 1,000 cu mm   Neutrophil Count, Fluid 50 (H) 0 - 25 %   Lymphs, Fluid 26 %   Monocyte-Macrophage-Serous Fluid 24 (L) 50 - 90 %  Results for orders  placed or performed during the hospital encounter of 03/28/16 (from the past 336 hour(s))  Basic metabolic panel   Collection Time: 03/28/16 11:58 AM  Result Value Ref Range   Sodium 138 135 - 145 mmol/L   Potassium 3.4 (L) 3.5 - 5.1 mmol/L   Chloride 106 101 - 111 mmol/L   CO2 23 22 - 32 mmol/L   Glucose, Bld 107 (H) 65 - 99 mg/dL   BUN 12 6 - 20 mg/dL   Creatinine, Ser 0.71 0.44 - 1.00 mg/dL   Calcium 9.3 8.9 - 10.3 mg/dL   GFR calc non Af Amer >60 >60 mL/min   GFR calc Af Amer >60 >60 mL/min   Anion gap 9 5 - 15  CBC   Collection Time: 03/28/16 11:58 AM  Result Value Ref Range   WBC 6.2 4.0 - 10.5 K/uL   RBC 4.17 3.87 - 5.11 MIL/uL   Hemoglobin 13.2 12.0 - 15.0 g/dL   HCT 38.6 36.0 - 46.0 %   MCV 92.6  78.0 - 100.0 fL   MCH 31.7 26.0 - 34.0 pg   MCHC 34.2 30.0 - 36.0 g/dL   RDW 13.0 11.5 - 15.5 %   Platelets 296 150 - 400 K/uL  Type and screen [place]   Collection Time: 03/28/16 11:58 AM  Result Value Ref Range   ABO/RH(D) O POS    Antibody Screen NEG    Sample Expiration 04/11/2016    Extend sample reason NO TRANSFUSIONS OR PREGNANCY IN THE PAST 3 MONTHS   ABO/Rh   Collection Time: 03/28/16 11:58 AM  Result Value Ref Range   ABO/RH(D) O POS   Results for orders placed or performed in visit on 03/24/16 (from the past 336 hour(s))  Cystic Fibrosis Mutation 97   Collection Time: 03/24/16 11:50 AM  Result Value Ref Range   Gene dis anal carrier interp Bld/T-Imp No variant detected.    Cystic Fibrosis Mutation 97 Comment   Rheumatoid Factor   Collection Time: 03/24/16 11:50 AM  Result Value Ref Range   Rhuematoid fact SerPl-aCnc <14 <14 IU/mL  Sed Rate (ESR)   Collection Time: 03/24/16 11:50 AM  Result Value Ref Range   Sed Rate 40 (H) 0 - 20 mm/hr  Aspergillus IgE Panel   Collection Time: 03/24/16 11:50 AM  Result Value Ref Range   Aspergillus IgE Panel <0.10 <0.35 kU/L   Class 0    Results Received-AspIgE <0.10 <0.35 kU/L   Class 0    Aspergillus Versicolor IgE <0.10 <0.35 kU/L   Class 0    Aspergillus nidulans IgE <0.35 <0.35 kU/L   Class 0    Aspergillus flavus IgE <0.35 <0.35 kU/L   Class 0    Aspergillus Terreus IgE <0.35 <0.35 kU/L   Class 0    Aspergillus Amstel/glauc IgE <0.35 <0.35 kU/L   Class 0   IgG Subclasses   Collection Time: 03/24/16 11:50 AM  Result Value Ref Range   IgG Subclass 1 578 382 - 929 mg/dL   IgG Subclass 2 601 241 - 700 mg/dL   IgG Subclass 3 48 22 - 178 mg/dL   IgG Subclass 4 10.1 4.0 - 29.9 mg/dL  Cyclic citrul peptide antibody, IgG   Collection Time: 03/24/16 11:50 AM  Result Value Ref Range   Cyclic Citrullin Peptide Ab <16 Units  ANA w/Reflex   Collection Time: 03/24/16 11:50 AM  Result Value Ref Range    Anit Nuclear Antibody(ANA) Negative Negative  Imaging Studies: Ct Chest Wo Contrast  Result Date: 03/21/2016 CLINICAL DATA:  Follow-up bronchiectasis and pulmonary nodules. EXAM: CT CHEST WITHOUT CONTRAST TECHNIQUE: Multidetector CT imaging of the chest was performed following the standard protocol without IV contrast. COMPARISON:  07/16/2015 high-resolution chest CT. FINDINGS: Cardiovascular: Normal heart size. No significant pericardial fluid/thickening. Great vessels are normal in course and caliber. Mediastinum/Nodes: No discrete thyroid nodules. Unremarkable esophagus. No pathologically enlarged axillary, mediastinal or gross hilar lymph nodes, noting limited sensitivity for the detection of hilar adenopathy on this noncontrast study. Lungs/Pleura: No pneumothorax. No pleural effusion. Mosaic attenuation throughout both lungs appears unchanged, due to extensive air trapping as seen on the prior high-resolution chest CT. There is patchy moderate to severe cylindrical and varicoid bronchiectasis throughout both lungs, asymmetrically prominent in the basilar right upper lobe, right middle lobe and medial left lower lobe, with associated bronchial wall thickening and scattered mucoid impaction, not appreciably changed in severity. Scattered solid pulmonary nodules in both lungs in centrilobular and subpleural locations measuring up to the 8 mm in the right upper lobe (series 3/ image 56), 8 mm in the posterior right lower lobe (series 3/ image 88) and 5 mm in the left upper lobe (series 3/ image 63), all stable since 07/16/2015, considered benign. No acute consolidative airspace disease, lung masses or new significant pulmonary nodules. Stable small bandlike opacities at the areas of bronchiectasis with associated distortion and mild volume loss, compatible with postinfectious/ postinflammatory scarring. Upper abdomen: Unremarkable. Musculoskeletal:  No aggressive appearing focal osseous lesions.  IMPRESSION: 1. Stable scattered centrilobular and subpleural pulmonary nodules measuring up to 8 mm, for which 8 month stability has been demonstrated, considered benign. 2. No acute disease in the chest. 3. Stable asymmetrically distributed regions of moderate to severe cylindrical and varicoid bronchiectasis throughout both lungs. Stable associated mild regions of postinfectious/postinflammatory scarring. 4. Stable mosaic attenuation throughout both lungs due to extensive air trapping as seen on the prior high-resolution chest CT. Electronically Signed   By: Ilona Sorrel M.D.   On: 03/21/2016 15:19   Dg Chest Port 1 View  Result Date: 03/29/2016 CLINICAL DATA:  Post bilateral Long washings, bronchoalveolar lavage. EXAM: PORTABLE CHEST 1 VIEW COMPARISON:  11/05/2015 FINDINGS: Hazy perihilar opacities likely rib related to the bronchoalveolar lavage. No pneumothorax. Heart is normal size. No effusions or acute bony abnormality. IMPRESSION: No pneumothorax. Mild hazy perihilar opacities likely related to the bronchoalveolar lavage. Electronically Signed   By: Rolm Baptise M.D.   On: 03/29/2016 10:54    Assessment: Patient Active Problem List   Diagnosis Date Noted  . Preoperative clearance 03/18/2016  . Lung nodules 03/16/2016  . Chest pain 11/05/2015  . Chest pressure 11/05/2015  . Palpitations   . GERD (gastroesophageal reflux disease) 07/30/2015  . Bronchiectasis without acute exacerbation ([place]) 07/08/2015  . COPD (chronic obstructive pulmonary disease) ([place]) 06/04/2015  . Fatigue 06/04/2015    Plan: Patient will undergo surgical management with LAVH with bilateral salpingectomy.   The risks of surgery were discussed in detail with the patient including but not limited to: bleeding which may require transfusion or reoperation; infection which may require antibiotics; injury to surrounding organs which may involve bowel, bladder, ureters ; need for additional procedures including laparoscopy  or laparotomy; thromboembolic phenomenon, surgical site problems and other postoperative/anesthesia complications. Likelihood of success in alleviating the patient's condition was discussed. Routine postoperative instructions will be reviewed with the patient and her family in detail after surgery.  The patient concurred with the proposed  plan, giving informed written consent for the surgery.  Patient has been NPO since last night she will remain NPO for procedure.  Anesthesia and OR aware.  Preoperative prophylactic antibiotics and SCDs ordered on call to the OR.  To OR when ready.  Pt is s/p pulm clearance  Total face-to-face time with patient was 20 min.  Greater than 50% was spent in counseling and coordination of care with the patient.     Dianey Suchy L. Ihor Dow, M.D., [facility] 04/01/2016 11:37 AM

## 2016-04-02 LAB — HIV ANTIBODY (ROUTINE TESTING W REFLEX): HIV 1&2 Ab, 4th Generation: NONREACTIVE

## 2016-04-02 LAB — ANA W/REFLEX: ANA: NEGATIVE

## 2016-04-03 ENCOUNTER — Telehealth: Payer: Self-pay | Admitting: Pulmonary Disease

## 2016-04-03 NOTE — Telephone Encounter (Signed)
Pt's HIV test is negative. I called and informed the patient about this today as she was anxious to get the results. Also the bronch results so far including the AFB smear, culture are negative.  She is asking if it is still Ok to proceed with the hysterectomy. I told her that it would be ok to go ahead.   I will contact the micro lab to see if they can get PJP PCR to confirm the findings. Beta D glucan is still pending Discussed with ID regarding the positive PJP by DFA. We feel this may be a false positive.  PM

## 2016-04-04 ENCOUNTER — Encounter (HOSPITAL_COMMUNITY): Payer: Self-pay | Admitting: Anesthesiology

## 2016-04-04 LAB — CYTOLOGY - PAP
Diagnosis: NEGATIVE
HPV (WINDOPATH): DETECTED — AB

## 2016-04-04 LAB — C3 AND C4
C3 COMPLEMENT: 178 mg/dL (ref 83–193)
C4 COMPLEMENT: 33 mg/dL (ref 15–57)

## 2016-04-04 LAB — COMPLEMENT, TOTAL

## 2016-04-04 NOTE — Anesthesia Preprocedure Evaluation (Signed)
Anesthesia Evaluation  Patient identified by MRN, date of birth, ID band Patient awake    Reviewed: Allergy & Precautions, NPO status , Patient's Chart, lab work & pertinent test results, reviewed documented beta blocker date and time   Airway        Dental   Pulmonary COPD,  Bronchiectasis Hx/o pulmonary nodules           Cardiovascular + dysrhythmias Supra Ventricular Tachycardia      Neuro/Psych  Headaches, Right leg numbness negative psych ROS   GI/Hepatic Neg liver ROS, GERD  Medicated and Controlled,  Endo/Other  negative endocrine ROS  Renal/GU negative Renal ROS  negative genitourinary   Musculoskeletal negative musculoskeletal ROS (+)   Abdominal   Peds  Hematology  (+) anemia ,   Anesthesia Other Findings   Reproductive/Obstetrics Uterine fibroids                             Anesthesia Physical Anesthesia Plan  ASA: III  Anesthesia Plan: General   Post-op Pain Management:    Induction: Intravenous  Airway Management Planned: Oral ETT  Additional Equipment:   Intra-op Plan:   Post-operative Plan: Extubation in OR  Informed Consent:   Plan Discussed with:   Anesthesia Plan Comments:         Anesthesia Quick Evaluation

## 2016-04-04 NOTE — Anesthesia Preprocedure Evaluation (Addendum)
Anesthesia Evaluation  Patient identified by MRN, date of birth, ID band Patient awake    Reviewed: Allergy & Precautions, NPO status , Patient's Chart, lab work & pertinent test results  Airway Mallampati: II       Dental no notable dental hx.    Pulmonary    Pulmonary exam normal        Cardiovascular negative cardio ROS Normal cardiovascular exam     Neuro/Psych negative psych ROS   GI/Hepatic Neg liver ROS, GERD  Medicated and Controlled,  Endo/Other  negative endocrine ROS  Renal/GU negative Renal ROS     Musculoskeletal negative musculoskeletal ROS (+)   Abdominal Normal abdominal exam  (+)   Peds  Hematology   Anesthesia Other Findings Gated Spect Myocardial Perfusion Imaging with Exercise Stress 1 Day Rest/Stress Protocol  Order# [id number]  Reading physician: Sueanne Margarita, MD Ordering physician: Lonn Georgia, PA-C Study date: 12/18/15 Patient Information   Name MRN Description Solara Goodchild [id number] 46 y.o. Female Result Notes   Notes Recorded by Jeanann Lewandowsky, RMA on 12/21/2015 at 10:04 AM EST Pt aware of his stress test and he verbalized understanding. ------  Notes Recorded by Jeanann Lewandowsky, RMA on 12/21/2015 at 9:54 AM EST Lmptcb jw 12/21/15 ------  Notes Recorded by Erma Heritage, PA on 12/19/2015 at 3:40 PM EST Please let the patient know her stress test showed normal pumping function and no ischemia on images. Reviewed borderline TID with Dr. Martinique and would not pursue further testing at this time. Thanks!  ECHO COMPLETE WO IMAGE ENHANCING AGENT  Order# [id number]  Reading physician: Sueanne Margarita, MD Ordering physician: Phillips Grout, MD Study date: 11/07/15 Study Result   Result status: Final result                             *[facility]*                         [address], [place]  [address]                            [phone]  ------------------------------------------------------------------- Transthoracic Echocardiography  Patient:    Rebeka, Kimble MR #:       [id number] Study Date: 11/07/2015 Gender:     F Age:        88 Height:     170.2 cm Weight:     75.4 kg BSA:        1.9 m^2 Pt. Status: Room:       3E11C   ADMITTING    Lyanne Co, Rachal A  REFERRING    Phillips Grout  PERFORMING   [facility], Inpatient  SONOGRAPHER  Darlina Sicilian, RDCS  ATTENDING    Little, Wenda Overland  cc:  ------------------------------------------------------------------- LV EF: 65% -   70%  ------------------------------------------------------------------- Indications:      Abnormal EKG 794.31.  ------------------------------------------------------------------- History:   PMH:  Tachycardia and Shortness of Breath.  Chest pain. Chronic obstructive pulmonary disease.  ------------------------------------------------------------------- Study Conclusions  - Left ventricle: The  cavity size was normal. There was mild focal   basal hypertrophy of the septum. Systolic function was vigorous.   The estimated ejection fraction was in the range of 65% to 70%.   Wall motion was normal; there were no regional wall motion   abnormalities. Left ventricular diastolic function parameters   were normal. - Aortic valve: Trileaflet; normal thickness, mildly calcified   leaflets.  ------------------------------------------------------------------- Study data:  No prior study was available for comparison.  Study status:  Routine.  Procedure:  The patient reported no pain pre or post test. Transthoracic echocardiography. Image quality was adequate.  Study completion:  There were no complications. Transthoracic echocardiography.  M-mode, complete 2D, spectral Doppler, and color Doppler.  Birthdate:  Patient birthdate: 01/29/1970.  Age:  Patient is 46  yr old.  Sex:  Gender: female. BMI: 26 kg/m^2.  Blood pressure:     114/80  Patient status: Inpatient.  Study date:  Study date: 11/07/2015. Study time: 10:17 AM.  Location:  Bedside.  -------------------------------------------------------------------  ------------------------------------------------------------------- Left ventricle:  The cavity size was normal. There was mild focal basal hypertrophy of the septum. Systolic function was vigorous. The estimated ejection fraction was in the range of 65% to 70%. Wall motion was normal; there were no regional wall motion abnormalities. The transmitral flow pattern was normal. The deceleration time of the early transmitral flow velocity was normal. The pulmonary vein flow pattern was normal. The tissue Doppler parameters were normal. Left ventricular diastolic function parameters were normal.  ------------------------------------------------------------------- Aortic valve:   Trileaflet; normal thickness, mildly calcified leaflets. Mobility was not restricted.  Doppler:  Transvalvular velocity was within the normal range. There was no stenosis. There was no regurgitation.  ------------------------------------------------------------------- Aorta:  Aortic root: The aortic root was normal in size.  ------------------------------------------------------------------- Mitral valve:   Structurally normal valve.   Mobility was not restricted.  Doppler:  Transvalvular velocity was within the normal range. There was no evidence for stenosis. There was no regurgitation.    Peak gradient (D): 2 mm Hg.  ------------------------------------------------------------------- Left atrium:  The atrium was normal in size.  ------------------------------------------------------------------- Right ventricle:  The cavity size was normal. Wall thickness was normal. Systolic function was  normal.  ------------------------------------------------------------------- Pulmonic valve:    Structurally normal valve.   Cusp separation was normal.  Doppler:  Transvalvular velocity was within the normal range. There was no evidence for stenosis. There was no regurgitation.  ------------------------------------------------------------------- Tricuspid valve:   Structurally normal valve.    Doppler: Transvalvular velocity was within the normal range. There was no regurgitation.  ------------------------------------------------------------------- Pulmonary artery:   The main pulmonary artery was normal-sized. Systolic pressure could not be accurately estimated.  ------------------------------------------------------------------- Right atrium:  The atrium was normal in size.  ------------------------------------------------------------------- Pericardium:  There was no pericardial effusion.  ------------------------------------------------------------------- Systemic veins: Inferior vena cava: The vessel was normal in size.  ------------------------------------------------------------------- Measurements   Left ventricle                           Value        Reference  LV ID, ED, PLAX chordal        (L)       32.7  mm     43 - 52  LV ID, ES, PLAX chordal        (L)       21.8  mm     23 - 38  LV fx shortening, PLAX chordal  33    %      >=29  LV PW thickness, ED                      9.39  mm     ---------  IVS/LV PW ratio, ED                      1.3          <=1.3  Stroke volume, 2D                        49    ml     ---------  Stroke volume/bsa, 2D                    26    ml/m^2 ---------  LV e&', lateral                           11.1  cm/s   ---------  LV E/e&', lateral                         6.83         ---------  LV e&', medial                            9.25  cm/s   ---------  LV E/e&', medial                          8.19         ---------  LV  e&', average                           10.18 cm/s   ---------  LV E/e&', average                         7.45         ---------    Ventricular septum                       Value        Reference  IVS thickness, ED                        12.2  mm     ---------    LVOT                                     Value        Reference  LVOT ID, S                               15    mm     ---------  LVOT area                                1.77  cm^2   ---------  LVOT peak velocity, S  173   cm/s   ---------  LVOT mean velocity, S                    125   cm/s   ---------  LVOT VTI, S                              27.8  cm     ---------  LVOT peak gradient, S                    12    mm Hg  ---------    Aorta                                    Value        Reference  Aortic root ID, ED                       27    mm     ---------    Left atrium                              Value        Reference  LA ID, A-P, ES                           27    mm     ---------  LA ID/bsa, A-P                           1.42  cm/m^2 <=2.2  LA volume, S                             24.7  ml     ---------  LA volume/bsa, S                         13    ml/m^2 ---------  LA volume, ES, 1-p A4C                   15.9  ml     ---------  LA volume/bsa, ES, 1-p A4C               8.4   ml/m^2 ---------  LA volume, ES, 1-p A2C                   34.5  ml     ---------  LA volume/bsa, ES, 1-p A2C               18.1  ml/m^2 ---------    Mitral valve                             Value        Reference  Mitral E-wave peak velocity              75.8  cm/s   ---------  Mitral A-wave peak velocity              100   cm/s   ---------  Mitral deceleration time       (  L)       76    ms     150 - 230  Mitral peak gradient, D                  2     mm Hg  ---------  Mitral E/A ratio, peak                   0.8          ---------    Right ventricle                          Value        Reference  RV s&', lateral, S                         19    cm/s   ---------  Legend: (L)  and  (H)  mark values outside specified reference range.  ------------------------------------------------------------------- Prepared and Electronically Authenticated by  Fransico Him, MD 2017-10-28T13:45:08 PACS Images   Show images for Echocardiogram Patient Information   Patient Name Betzaira, Mentel Sex Female DOB 1970/03/28 SSN [id number] Reason For Exam  Priority: Routine  Not on file Surgical History   Surgical History    No past medical history on file.  Other Surgical History    Procedure Laterality Date Comment Source CESAREAN SECTION    Provider LUNG BIOPSY    Provider VIDEO BRONCHOSCOPY Bilateral 03/29/2016 Procedure: VIDEO BRONCHOSCOPY WITHOUT FLUORO; Surgeon: Marshell Garfinkel, MD; Location: [facility]; Service: Cardiopulmonary; Laterality: Bilateral; Provider  Patient Data   Height  67 in  BP  114/80 mmHg    Performing Technologist/Nurse   Performing Technologist/Nurse: Hassie Bruce          Implants     No active implants to display in this view. Order-Level Documents - 11/05/2015:   Scan on 11/06/2015 11:57 AM by Provider Default, [place] on 11/06/2015 11:57 AM by Provider Default, MD    Encounter-Level Documents - 11/05/2015:   Document on 11/10/2015 12:11 AM by Evelina Dun, RN : ED Encounter Summary  Document on 11/10/2015 12:11 AM by Evelina Dun, RN : ED PB Summary  Scan on 11/08/2015 11:24 AM by Provider Default, MDScan on 11/08/2015 11:24 AM by Provider Default, MD  Document on 11/07/2015 6:31 PM by Lindwood Qua, RN : IP After Visit Summary  Document on 11/06/2015 8:09 PM by Sharlett Iles, MD : ED PB Summary  Scan on 11/06/2015 11:42 AM by Provider Default, MDScan on 11/06/2015 11:42 AM by Provider Default, MD  Scan on 11/07/2015 9:04 AM by Provider Default, MDScan on 11/07/2015 9:04 AM by Provider Default, MD  Electronic signature on 11/05/2015  9:43 PM    Signed   Electronically signed by Sueanne Margarita, MD on 11/07/15 at 1345 EDT Printable Result Report   Result Report    Reproductive/Obstetrics                            Anesthesia Physical Anesthesia Plan  ASA: II  Anesthesia Plan: General   Post-op Pain Management:    Induction: Intravenous  Airway Management Planned: Oral ETT  Additional Equipment:   Intra-op Plan:   Post-operative Plan: Extubation in OR  Informed Consent: I have reviewed the patients History and Physical, chart, labs and discussed the procedure including the risks, benefits and alternatives  for the proposed anesthesia with the patient or authorized representative who has indicated his/her understanding and acceptance.   Dental advisory given  Plan Discussed with: CRNA and Surgeon  Anesthesia Plan Comments:        Anesthesia Quick Evaluation

## 2016-04-05 ENCOUNTER — Encounter (HOSPITAL_COMMUNITY)
Admission: RE | Disposition: A | Discharge status: Home / Self Care | Payer: Self-pay | Source: Ambulatory Visit | Attending: Obstetrics & Gynecology

## 2016-04-05 ENCOUNTER — Ambulatory Visit (HOSPITAL_COMMUNITY): Payer: Medicare HMO | Admitting: Anesthesiology

## 2016-04-05 ENCOUNTER — Encounter (HOSPITAL_COMMUNITY): Payer: Self-pay

## 2016-04-05 ENCOUNTER — Observation Stay (HOSPITAL_COMMUNITY)
Admission: RE | Admit: 2016-04-05 | Discharge: 2016-04-06 | Disposition: A | Discharge status: Home / Self Care | Payer: Medicare HMO | Source: Ambulatory Visit | Attending: Obstetrics & Gynecology | Admitting: Obstetrics & Gynecology

## 2016-04-05 DIAGNOSIS — Z79899 Other long term (current) drug therapy: Secondary | ICD-10-CM | POA: Insufficient documentation

## 2016-04-05 DIAGNOSIS — K219 Gastro-esophageal reflux disease without esophagitis: Secondary | ICD-10-CM | POA: Diagnosis not present

## 2016-04-05 DIAGNOSIS — D259 Leiomyoma of uterus, unspecified: Secondary | ICD-10-CM | POA: Diagnosis not present

## 2016-04-05 DIAGNOSIS — J449 Chronic obstructive pulmonary disease, unspecified: Secondary | ICD-10-CM | POA: Insufficient documentation

## 2016-04-05 DIAGNOSIS — I471 Supraventricular tachycardia: Secondary | ICD-10-CM | POA: Insufficient documentation

## 2016-04-05 DIAGNOSIS — N939 Abnormal uterine and vaginal bleeding, unspecified: Secondary | ICD-10-CM | POA: Diagnosis present

## 2016-04-05 DIAGNOSIS — Z7951 Long term (current) use of inhaled steroids: Secondary | ICD-10-CM | POA: Diagnosis not present

## 2016-04-05 DIAGNOSIS — N938 Other specified abnormal uterine and vaginal bleeding: Secondary | ICD-10-CM | POA: Diagnosis not present

## 2016-04-05 DIAGNOSIS — Z8709 Personal history of other diseases of the respiratory system: Secondary | ICD-10-CM | POA: Insufficient documentation

## 2016-04-05 DIAGNOSIS — R102 Pelvic and perineal pain: Secondary | ICD-10-CM | POA: Diagnosis not present

## 2016-04-05 DIAGNOSIS — Z9889 Other specified postprocedural states: Secondary | ICD-10-CM

## 2016-04-05 DIAGNOSIS — D251 Intramural leiomyoma of uterus: Secondary | ICD-10-CM

## 2016-04-05 HISTORY — PX: CYSTOSCOPY: SHX5120

## 2016-04-05 HISTORY — PX: LAPAROSCOPIC VAGINAL HYSTERECTOMY WITH SALPINGECTOMY: SHX6680

## 2016-04-05 LAB — PREGNANCY, URINE: Preg Test, Ur: NEGATIVE

## 2016-04-05 LAB — FUNGITELL (1-3)-B-D-GLUCAN: Interpretation: NEGATIVE

## 2016-04-05 SURGERY — HYSTERECTOMY, VAGINAL, LAPAROSCOPY-ASSISTED, WITH SALPINGECTOMY
Anesthesia: General | Site: Bladder

## 2016-04-05 MED ORDER — PHENYLEPHRINE 40 MCG/ML (10ML) SYRINGE FOR IV PUSH (FOR BLOOD PRESSURE SUPPORT)
PREFILLED_SYRINGE | INTRAVENOUS | Status: AC
  Filled 2016-04-05: qty 10

## 2016-04-05 MED ORDER — ROCURONIUM BROMIDE 100 MG/10ML IV SOLN
INTRAVENOUS | Status: AC
  Filled 2016-04-05: qty 1

## 2016-04-05 MED ORDER — KETOROLAC TROMETHAMINE 30 MG/ML IJ SOLN: 30.0000 mg | Freq: Once | INTRAMUSCULAR | Status: DC

## 2016-04-05 MED ORDER — BISACODYL 10 MG RE SUPP: 10.0000 mg | Freq: Every day | RECTAL | Status: DC | PRN

## 2016-04-05 MED ORDER — METHYLENE BLUE 0.5 % INJ SOLN
INTRAVENOUS | Status: DC | PRN
  Administered 2016-04-05: 50 mg via INTRAVENOUS

## 2016-04-05 MED ORDER — SUGAMMADEX SODIUM 200 MG/2ML IV SOLN
INTRAVENOUS | Status: AC
  Filled 2016-04-05: qty 2

## 2016-04-05 MED ORDER — METOPROLOL SUCCINATE ER 50 MG PO TB24
50.0000 mg | ORAL_TABLET | Freq: Every day | ORAL | Status: DC
  Administered 2016-04-06: 50 mg via ORAL
  Filled 2016-04-05: qty 1

## 2016-04-05 MED ORDER — ONDANSETRON HCL 4 MG/2ML IJ SOLN
4.0000 mg | Freq: Four times a day (QID) | INTRAMUSCULAR | Status: DC | PRN
  Administered 2016-04-05: 4 mg via INTRAVENOUS
  Filled 2016-04-05: qty 2

## 2016-04-05 MED ORDER — KETOROLAC TROMETHAMINE 30 MG/ML IJ SOLN
30.0000 mg | Freq: Four times a day (QID) | INTRAMUSCULAR | Status: DC
  Filled 2016-04-05: qty 1

## 2016-04-05 MED ORDER — SCOPOLAMINE 1 MG/3DAYS TD PT72
1.0000 | MEDICATED_PATCH | Freq: Once | TRANSDERMAL | Status: DC
  Administered 2016-04-05: 1.5 mg via TRANSDERMAL

## 2016-04-05 MED ORDER — ALBUTEROL SULFATE (2.5 MG/3ML) 0.083% IN NEBU
3.0000 mL | INHALATION_SOLUTION | Freq: Four times a day (QID) | RESPIRATORY_TRACT | Status: DC | PRN
Start: 2016-04-05 — End: 2016-04-06

## 2016-04-05 MED ORDER — MOMETASONE FURO-FORMOTEROL FUM 200-5 MCG/ACT IN AERO
2.0000 | INHALATION_SPRAY | Freq: Two times a day (BID) | RESPIRATORY_TRACT | Status: DC
  Administered 2016-04-05 – 2016-04-06 (×2): 2 via RESPIRATORY_TRACT
  Filled 2016-04-05: qty 8.8

## 2016-04-05 MED ORDER — BUPIVACAINE HCL (PF) 0.5 % IJ SOLN
INTRAMUSCULAR | Status: DC | PRN
  Administered 2016-04-05: 30 mL

## 2016-04-05 MED ORDER — MEPERIDINE HCL 25 MG/ML IJ SOLN: 6.2500 mg | INTRAMUSCULAR | Status: DC | PRN

## 2016-04-05 MED ORDER — GLYCOPYRROLATE 0.2 MG/ML IJ SOLN
INTRAMUSCULAR | Status: AC
  Filled 2016-04-05: qty 1

## 2016-04-05 MED ORDER — PANTOPRAZOLE SODIUM 40 MG PO TBEC
40.0000 mg | DELAYED_RELEASE_TABLET | Freq: Every day | ORAL | Status: DC
  Administered 2016-04-06: 40 mg via ORAL
  Filled 2016-04-05: qty 1

## 2016-04-05 MED ORDER — KETOROLAC TROMETHAMINE 30 MG/ML IJ SOLN
INTRAMUSCULAR | Status: AC
  Filled 2016-04-05: qty 1

## 2016-04-05 MED ORDER — FENTANYL CITRATE (PF) 250 MCG/5ML IJ SOLN
INTRAMUSCULAR | Status: AC
  Filled 2016-04-05: qty 5

## 2016-04-05 MED ORDER — ONDANSETRON HCL 4 MG PO TABS: 4.0000 mg | ORAL_TABLET | Freq: Four times a day (QID) | ORAL | Status: DC | PRN

## 2016-04-05 MED ORDER — CEFAZOLIN SODIUM-DEXTROSE 2-4 GM/100ML-% IV SOLN
2.0000 g | INTRAVENOUS | Status: AC
  Administered 2016-04-05: 2 g via INTRAVENOUS

## 2016-04-05 MED ORDER — HYDROMORPHONE HCL 1 MG/ML IJ SOLN
INTRAMUSCULAR | Status: AC
  Filled 2016-04-05: qty 1

## 2016-04-05 MED ORDER — LIDOCAINE HCL (CARDIAC) 20 MG/ML IV SOLN
INTRAVENOUS | Status: DC | PRN
  Administered 2016-04-05: 80 mg via INTRAVENOUS

## 2016-04-05 MED ORDER — DEXAMETHASONE SODIUM PHOSPHATE 4 MG/ML IJ SOLN
INTRAMUSCULAR | Status: AC
Start: 2016-04-05 — End: 2016-04-05
  Filled 2016-04-05: qty 1

## 2016-04-05 MED ORDER — FENTANYL CITRATE (PF) 100 MCG/2ML IJ SOLN
INTRAMUSCULAR | Status: DC | PRN
  Administered 2016-04-05: 100 ug via INTRAVENOUS
  Administered 2016-04-05: 25 ug via INTRAVENOUS
  Administered 2016-04-05: 50 ug via INTRAVENOUS
  Administered 2016-04-05: 25 ug via INTRAVENOUS
  Administered 2016-04-05: 50 ug via INTRAVENOUS

## 2016-04-05 MED ORDER — HYDROMORPHONE HCL 1 MG/ML IJ SOLN
0.2000 mg | INTRAMUSCULAR | Status: DC | PRN
  Administered 2016-04-05 – 2016-04-06 (×3): 0.6 mg via INTRAVENOUS
  Filled 2016-04-05 (×3): qty 1

## 2016-04-05 MED ORDER — ALBUTEROL SULFATE HFA 108 (90 BASE) MCG/ACT IN AERS
INHALATION_SPRAY | RESPIRATORY_TRACT | Status: AC
  Filled 2016-04-05: qty 6.7

## 2016-04-05 MED ORDER — SODIUM CHLORIDE 0.9 % IJ SOLN
INTRAMUSCULAR | Status: AC
  Filled 2016-04-05: qty 100

## 2016-04-05 MED ORDER — KETOROLAC TROMETHAMINE 30 MG/ML IJ SOLN
30.0000 mg | Freq: Four times a day (QID) | INTRAMUSCULAR | Status: DC
  Administered 2016-04-05 – 2016-04-06 (×3): 30 mg via INTRAVENOUS
  Filled 2016-04-05 (×3): qty 1

## 2016-04-05 MED ORDER — IBUPROFEN 600 MG PO TABS: 600.0000 mg | ORAL_TABLET | Freq: Four times a day (QID) | ORAL | Status: DC | PRN

## 2016-04-05 MED ORDER — DOCUSATE SODIUM 100 MG PO CAPS
100.0000 mg | ORAL_CAPSULE | Freq: Two times a day (BID) | ORAL | Status: DC
  Administered 2016-04-05 – 2016-04-06 (×2): 100 mg via ORAL
  Filled 2016-04-05 (×2): qty 1

## 2016-04-05 MED ORDER — SODIUM CHLORIDE 0.9 % IJ SOLN
INTRAMUSCULAR | Status: AC
  Filled 2016-04-05: qty 10

## 2016-04-05 MED ORDER — VASOPRESSIN 20 UNIT/ML IV SOLN
INTRAVENOUS | Status: AC
  Filled 2016-04-05: qty 1

## 2016-04-05 MED ORDER — STERILE WATER FOR IRRIGATION IR SOLN
Status: DC | PRN
  Administered 2016-04-05: 1000 mL via INTRAVESICAL

## 2016-04-05 MED ORDER — ONDANSETRON HCL 4 MG/2ML IJ SOLN
INTRAMUSCULAR | Status: DC | PRN
  Administered 2016-04-05: 4 mg via INTRAVENOUS

## 2016-04-05 MED ORDER — ROCURONIUM BROMIDE 100 MG/10ML IV SOLN
INTRAVENOUS | Status: DC | PRN
  Administered 2016-04-05: 40 mg via INTRAVENOUS
  Administered 2016-04-05: 10 mg via INTRAVENOUS

## 2016-04-05 MED ORDER — PROPOFOL 10 MG/ML IV BOLUS
INTRAVENOUS | Status: AC
  Filled 2016-04-05: qty 20

## 2016-04-05 MED ORDER — DEXAMETHASONE SODIUM PHOSPHATE 10 MG/ML IJ SOLN
INTRAMUSCULAR | Status: DC | PRN
  Administered 2016-04-05: 4 mg via INTRAVENOUS

## 2016-04-05 MED ORDER — LACTATED RINGERS IR SOLN
Status: DC | PRN
  Administered 2016-04-05: 3000 mL

## 2016-04-05 MED ORDER — GLYCOPYRROLATE 0.2 MG/ML IJ SOLN
INTRAMUSCULAR | Status: DC | PRN
  Administered 2016-04-05: 0.2 mg via INTRAVENOUS

## 2016-04-05 MED ORDER — DEXTROSE-NACL 5-0.45 % IV SOLN
INTRAVENOUS | Status: AC
  Administered 2016-04-05: 15:00:00 via INTRAVENOUS

## 2016-04-05 MED ORDER — PROMETHAZINE HCL 25 MG/ML IJ SOLN: 6.2500 mg | INTRAMUSCULAR | Status: DC | PRN

## 2016-04-05 MED ORDER — POLYETHYLENE GLYCOL 3350 17 G PO PACK: 17.0000 g | PACK | Freq: Every day | ORAL | Status: DC | PRN

## 2016-04-05 MED ORDER — PHENYLEPHRINE HCL 10 MG/ML IJ SOLN
INTRAMUSCULAR | Status: DC | PRN
  Administered 2016-04-05: 120 ug via INTRAVENOUS
  Administered 2016-04-05 (×2): 80 ug via INTRAVENOUS
  Administered 2016-04-05: 40 ug via INTRAVENOUS
  Administered 2016-04-05 (×5): 80 ug via INTRAVENOUS
  Administered 2016-04-05: 40 ug via INTRAVENOUS

## 2016-04-05 MED ORDER — EPHEDRINE SULFATE 50 MG/ML IJ SOLN
INTRAMUSCULAR | Status: DC | PRN
  Administered 2016-04-05 (×2): 5 mg via INTRAVENOUS

## 2016-04-05 MED ORDER — HYDROMORPHONE HCL 1 MG/ML IJ SOLN
0.2500 mg | INTRAMUSCULAR | Status: DC | PRN
  Administered 2016-04-05 (×3): 0.5 mg via INTRAVENOUS

## 2016-04-05 MED ORDER — KETOROLAC TROMETHAMINE 30 MG/ML IJ SOLN
INTRAMUSCULAR | Status: DC | PRN
  Administered 2016-04-05: 30 mg via INTRAVENOUS

## 2016-04-05 MED ORDER — VASOPRESSIN 20 UNIT/ML IV SOLN
INTRAVENOUS | Status: DC | PRN
  Administered 2016-04-05: 09:00:00 via INTRAMUSCULAR

## 2016-04-05 MED ORDER — ONDANSETRON HCL 4 MG/2ML IJ SOLN
INTRAMUSCULAR | Status: AC
  Filled 2016-04-05: qty 2

## 2016-04-05 MED ORDER — PROPOFOL 10 MG/ML IV BOLUS
INTRAVENOUS | Status: DC | PRN
  Administered 2016-04-05: 160 mg via INTRAVENOUS

## 2016-04-05 MED ORDER — SIMETHICONE 80 MG PO CHEW
80.0000 mg | CHEWABLE_TABLET | Freq: Four times a day (QID) | ORAL | Status: DC | PRN
  Administered 2016-04-06: 80 mg via ORAL
  Filled 2016-04-05: qty 1

## 2016-04-05 MED ORDER — EPHEDRINE 5 MG/ML INJ
INTRAVENOUS | Status: AC
  Filled 2016-04-05: qty 10

## 2016-04-05 MED ORDER — BUPIVACAINE HCL (PF) 0.5 % IJ SOLN
INTRAMUSCULAR | Status: AC
  Filled 2016-04-05: qty 30

## 2016-04-05 MED ORDER — LIDOCAINE HCL (CARDIAC) 20 MG/ML IV SOLN
INTRAVENOUS | Status: AC
  Filled 2016-04-05: qty 5

## 2016-04-05 MED ORDER — METHYLENE BLUE 0.5 % INJ SOLN
INTRAVENOUS | Status: AC
  Filled 2016-04-05: qty 10

## 2016-04-05 MED ORDER — ALBUTEROL SULFATE HFA 108 (90 BASE) MCG/ACT IN AERS
2.0000 | INHALATION_SPRAY | RESPIRATORY_TRACT | Status: AC
  Administered 2016-04-05: 3 via RESPIRATORY_TRACT

## 2016-04-05 MED ORDER — LACTATED RINGERS IV SOLN
INTRAVENOUS | Status: DC
  Administered 2016-04-05: 09:00:00 via INTRAVENOUS
  Administered 2016-04-05: 125 mL/h via INTRAVENOUS

## 2016-04-05 MED ORDER — MIDAZOLAM HCL 2 MG/2ML IJ SOLN
INTRAMUSCULAR | Status: AC
  Filled 2016-04-05: qty 2

## 2016-04-05 MED ORDER — ESTRADIOL 0.1 MG/GM VA CREA
TOPICAL_CREAM | VAGINAL | Status: AC
  Filled 2016-04-05: qty 42.5

## 2016-04-05 MED ORDER — SUGAMMADEX SODIUM 200 MG/2ML IV SOLN
INTRAVENOUS | Status: DC | PRN
Start: 2016-04-05 — End: 2016-04-05
  Administered 2016-04-05: 160 mg via INTRAVENOUS

## 2016-04-05 MED ORDER — SCOPOLAMINE 1 MG/3DAYS TD PT72
MEDICATED_PATCH | TRANSDERMAL | Status: AC
  Administered 2016-04-05: 1.5 mg via TRANSDERMAL
  Filled 2016-04-05: qty 1

## 2016-04-05 MED ORDER — HYDROMORPHONE HCL 1 MG/ML IJ SOLN
INTRAMUSCULAR | Status: DC | PRN
  Administered 2016-04-05 (×2): 0.5 mg via INTRAVENOUS

## 2016-04-05 MED ORDER — MIDAZOLAM HCL 2 MG/2ML IJ SOLN
INTRAMUSCULAR | Status: DC | PRN
  Administered 2016-04-05: 2 mg via INTRAVENOUS

## 2016-04-05 SURGICAL SUPPLY — 53 items
APPLICATOR ARISTA FLEXITIP XL (MISCELLANEOUS) ×3 IMPLANT
BNDG GAUZE ELAST 4 BULKY (GAUZE/BANDAGES/DRESSINGS) IMPLANT
CABLE HIGH FREQUENCY MONO STRZ (ELECTRODE) ×3 IMPLANT
CLOTH BEACON ORANGE TIMEOUT ST (SAFETY) ×3 IMPLANT
CONT PATH 16OZ SNAP LID 3702 (MISCELLANEOUS) ×3 IMPLANT
COVER BACK TABLE 60X90IN (DRAPES) ×3 IMPLANT
COVER MAYO STAND STRL (DRAPES) ×3 IMPLANT
DECANTER SPIKE VIAL GLASS SM (MISCELLANEOUS) IMPLANT
DRSG OPSITE POSTOP 3X4 (GAUZE/BANDAGES/DRESSINGS) IMPLANT
DURAPREP 26ML APPLICATOR (WOUND CARE) ×6 IMPLANT
ELECT REM PT RETURN 9FT ADLT (ELECTROSURGICAL) ×3
ELECTRODE REM PT RTRN 9FT ADLT (ELECTROSURGICAL) ×2 IMPLANT
GAUZE PACKING 2X5 YD STRL (GAUZE/BANDAGES/DRESSINGS) IMPLANT
GLOVE BIO SURGEON STRL SZ7 (GLOVE) ×15 IMPLANT
GLOVE BIOGEL PI IND STRL 6.5 (GLOVE) ×6 IMPLANT
GLOVE BIOGEL PI IND STRL 7.0 (GLOVE) ×8 IMPLANT
GLOVE BIOGEL PI INDICATOR 6.5 (GLOVE) ×3
GLOVE BIOGEL PI INDICATOR 7.0 (GLOVE) ×4
GOWN STRL REUS W/TWL XL LVL3 (GOWN DISPOSABLE) ×3 IMPLANT
HEMOSTAT ARISTA ABSORB 3G PWDR (MISCELLANEOUS) ×3 IMPLANT
LEGGING LITHOTOMY PAIR STRL (DRAPES) ×3 IMPLANT
LIQUID BAND (GAUZE/BANDAGES/DRESSINGS) ×6 IMPLANT
MANIPULATOR UTERINE 4.5 ZUMI (MISCELLANEOUS) ×3 IMPLANT
NS IRRIG 1000ML POUR BTL (IV SOLUTION) ×3 IMPLANT
PACK LAVH (CUSTOM PROCEDURE TRAY) ×3 IMPLANT
PACK ROBOTIC GOWN (GOWN DISPOSABLE) ×3 IMPLANT
PACK TRENDGUARD 450 HYBRID PRO (MISCELLANEOUS) IMPLANT
PACK TRENDGUARD 600 HYBRD PROC (MISCELLANEOUS) IMPLANT
PROTECTOR NERVE ULNAR (MISCELLANEOUS) ×6 IMPLANT
SCISSORS LAP 5X35 DISP (ENDOMECHANICALS) IMPLANT
SET CYSTO W/LG BORE CLAMP LF (SET/KITS/TRAYS/PACK) ×3 IMPLANT
SET IRRIG TUBING LAPAROSCOPIC (IRRIGATION / IRRIGATOR) ×3 IMPLANT
SHEARS FOC LG CVD HARMONIC 17C (MISCELLANEOUS) ×3 IMPLANT
SHEARS HARMONIC ACE PLUS 36CM (ENDOMECHANICALS) ×3 IMPLANT
SLEEVE XCEL OPT CAN 5 100 (ENDOMECHANICALS) ×9 IMPLANT
SOLUTION ELECTROLUBE (MISCELLANEOUS) IMPLANT
SUT VIC AB 0 CT1 18XCR BRD8 (SUTURE) ×4 IMPLANT
SUT VIC AB 0 CT1 36 (SUTURE) ×3 IMPLANT
SUT VIC AB 0 CT1 8-18 (SUTURE) ×2
SUT VIC AB 3-0 SH 18 (SUTURE) ×3 IMPLANT
SUT VIC AB 3-0 X1 27 (SUTURE) ×3 IMPLANT
SUT VICRYL 0 TIES 12 18 (SUTURE) ×3 IMPLANT
SUT VICRYL 0 UR6 27IN ABS (SUTURE) ×3 IMPLANT
SUT VICRYL 4-0 PS2 18IN ABS (SUTURE) ×3 IMPLANT
SYR 50ML LL SCALE MARK (SYRINGE) IMPLANT
SYR 5ML LL (SYRINGE) ×3 IMPLANT
SYRINGE 10CC LL (SYRINGE) ×3 IMPLANT
TOWEL OR 17X24 6PK STRL BLUE (TOWEL DISPOSABLE) ×6 IMPLANT
TRAY FOLEY CATH SILVER 14FR (SET/KITS/TRAYS/PACK) ×3 IMPLANT
TRENDGUARD 450 HYBRID PRO PACK (MISCELLANEOUS)
TRENDGUARD 600 HYBRID PROC PK (MISCELLANEOUS)
TROCAR OPTI TIP 5M 100M (ENDOMECHANICALS) ×3 IMPLANT
WARMER LAPAROSCOPE (MISCELLANEOUS) ×3 IMPLANT

## 2016-04-05 NOTE — Anesthesia Procedure Notes (Signed)
Procedure Name: Intubation Date/Time: 04/05/2016 7:31 AM Performed by: Raenette Rover Pre-anesthesia Checklist: Patient identified, Emergency Drugs available, Suction available and Patient being monitored Patient Re-evaluated:Patient Re-evaluated prior to inductionOxygen Delivery Method: Circle system utilized Preoxygenation: Pre-oxygenation with 100% oxygen Intubation Type: IV induction Ventilation: Mask ventilation without difficulty Laryngoscope Size: Mac and 3 Grade View: Grade I Tube type: Oral Tube size: 7.0 mm Number of attempts: 1 Airway Equipment and Method: Stylet Placement Confirmation: ETT inserted through vocal cords under direct vision,  positive ETCO2,  CO2 detector and breath sounds checked- equal and bilateral Secured at: 22 cm Tube secured with: Tape Dental Injury: Teeth and Oropharynx as per pre-operative assessment

## 2016-04-05 NOTE — Op Note (Signed)
04/05/2016  9:58 AM  PATIENT:  Hannah Barron  46 y.o. female  PRE-OPERATIVE DIAGNOSIS:  Pelvic Pain  Uterine Fibroids 58552  POST-OPERATIVE DIAGNOSIS:  Pelvic Pain  Uterine Fibroids  PROCEDURE:  Procedure(s): LAPAROSCOPIC ASSISTED VAGINAL HYSTERECTOMY WITH SALPINGECTOMY (Bilateral) CYSTOSCOPY (N/A)  SURGEON:  Surgeon(s) and Role:    * Lavonia Drafts, MD - Primary    * Peggy Constant, MD  ANESTHESIA:   general  EBL:  Total I/O In: 1400 [I.V.:1400] Out: 450 [Urine:150; Blood:300]  BLOOD ADMINISTERED:none  DRAINS: none   LOCAL MEDICATIONS USED:  MARCAINE     SPECIMEN:  Source of Specimen:  uterus, cervix and fallopian tubes   DISPOSITION OF SPECIMEN:  PATHOLOGY  COUNTS:  YES  TOURNIQUET:  * No tourniquets in log *  DICTATION: .Note written in EPIC  PLAN OF CARE: Admit for overnight observation  PATIENT DISPOSITION:  PACU - hemodynamically stable.   Delay start of Pharmacological VTE agent (>24hrs) due to surgical blood loss or risk of bleeding: yes  Complications : none immediate   INDICATIONS: 46yo with h/o c/s x1 and aforementioned preoprative diagnoses here today for definitive surgical management.   Risks of surgery were discussed with the patient including but not limited to: bleeding which may require transfusion or reoperation; infection which may require antibiotics; injury to bowel, bladder, ureters or other surrounding organs; need for additional procedures including laparotomy; thromboembolic phenomenon, incisional problems and other postoperative/anesthesia complications. Written informed consent was obtained.    FINDINGS: 11 week sized uterus with irreg contour.  There was a large borad ligament fibroid on the left side. The ovaries and fallopian tubes were normal bilaterally. The upper abd was normal. The cystoscopy was normal with extravasation of blue dye bilaterally.  PROCEDURE IN DETAIL:  The patient received intravenous antibiotics and had  sequential compression devices applied to her lower extremities while in the preoperative area.  She was then taken to the operating room where general anesthesia was administered and was found to be adequate.  She was placed in the dorsal lithotomy position, and was prepped and draped in a sterile manner.  A Foley catheter was inserted into her bladder and attached to constant drainage and a HUMI uterine manipulator was then advanced into the uterus .  After an adequate timeout was performed, attention was then turned to the patient's abdomen where a 5-mm skin incision was made in the left upper quadrant.  A trocar was then placed through the incision.  After intraperitoneal placement was confirmed a pneumoperitoneum was obtained with carbon dioxide gas.  A survey of the patient's pelvis and abdomen revealed the above anatomy.   Bilateral 5-mm lower quadrant ports  were then placed under direct visualization.  The pelvis was then carefully examined.   On the right side, the round ligament was then clamped and transected with the Harmonic scapel device.  The uteroovarian ligament was also clamped and transected, and the fallopian tube on the right  was freed from the underlying mesosalpinx.  Excellent hemostasis was noted.  The broad ligament was serially clamped using the Harmonic to the level of the uterine artieries. On the left side, the fibroid extending into the broad ligament was carefull dissected out without difficulty. Excellent hemostasis was noted.  The decision was made to leave the trocars in place and proceed with completing the hysterectomy via the vaginal route .  Attention was then turned to her pelvis.  A weighted speculum was then placed in the vagina, and the anterior and  posterior lips of the cervix were grasped bilaterally with Edison Nasuti tenaculums.  The cervix was then injected circumferentially with dilute vasopressin solution.  The cervix was then circumferentially incised, and the anterior  cul-de-sac was entered sharply without difficulty and a retractor was placed.  The same procedure was performed posteriorly and the posterior cul-de-sac was entered sharply without difficulty.  A long weighted speculum was inserted into the posterior cul-de-sac.  The Heaney clamp was then used to clamp the uterosacral ligaments on either side.  They were then cut and sutured ligated with 0 Vicryl, and were held with a tag for later identification. Of note, all sutures used in this case were 0 Vicryl unless otherwise noted.   The cardinal ligaments were then clamped, cut and ligated bilaterally. The uterine vessels and broad ligaments were then serially clamped with the Heaney clamps, cut, and suture ligated on both sides.  The uterus was noted to be freed from all ligaments and was then delivered and sent to pathology.  Excellent hemostasis was noted at this point. The uterus was noted to be freed from all ligaments and was then delivered and sent to pathology.   After completion of the hysterectomy, all pedicles from the uterosacral ligament to the cornua were examined hemostasis was confirmed.  The uterosacral pedicles were then affixed to the ipsilateral vaginal cuff angles using 0 Vicryl sutures.  The vaginal cuff was then closed in a running locked fashion with 0 Vicryl.  All instruments were then removed from the pelvis.   At this point a cystoscopy was performed and there was bilateral flow of blue urine (pt had been given methylene blue by anesthesia)    Attention was then returned to her abdomen which was insufflated again with carbon dioxide gas.  The laparoscope was used to survey the operative site, and it was found to be hemostatic.   No intraoperative injury to other surrounding organs was noted. Arista was placed over the vaginal cuff and the abdomen was desufflated and all instruments were then removed from the patient's abdomen. The port sites were repaired with Dermabond and all of the sites  were injected with 0.5% Marcaine.  Benzoin and steri strips were applied. The patient tolerated the procedures well.  All instruments, needles, and sponge counts were correct x 2. The patient was taken to the recovery room awake, extubated and in stable condition.   Akul Leggette L. Harraway-Smith, M.D., Cherlynn June

## 2016-04-05 NOTE — Anesthesia Postprocedure Evaluation (Addendum)
Anesthesia Post Note  Patient: Hannah Barron  Procedure(s) Performed: Procedure(s) (LRB): LAPAROSCOPIC ASSISTED VAGINAL HYSTERECTOMY WITH SALPINGECTOMY (Bilateral) CYSTOSCOPY (N/A)  Patient location during evaluation: PACU Anesthesia Type: General Level of consciousness: sedated Pain management: pain level controlled Vital Signs Assessment: post-procedure vital signs reviewed and stable Cardiovascular status: stable Postop Assessment: no signs of nausea or vomiting Anesthetic complications: no        Last Vitals:  Vitals:   04/05/16 1130 04/05/16 1145  BP: (!) 111/58 108/63  Pulse: 71 75  Resp: (!) 9 13  Temp:      Last Pain:  Vitals:   04/05/16 1130  TempSrc:   PainSc: Asleep   Pain Goal: Patients Stated Pain Goal: 4 (04/05/16 1130)               Khasir Woodrome JR,JOHN Mateo Flow

## 2016-04-05 NOTE — Brief Op Note (Signed)
04/05/2016  9:58 AM  PATIENT:  Genita Ledonne  46 y.o. female  PRE-OPERATIVE DIAGNOSIS:  Pelvic Pain  Uterine Fibroids 58552  POST-OPERATIVE DIAGNOSIS:  Pelvic Pain  Uterine Fibroids  PROCEDURE:  Procedure(s): LAPAROSCOPIC ASSISTED VAGINAL HYSTERECTOMY WITH SALPINGECTOMY (Bilateral) CYSTOSCOPY (N/A)  SURGEON:  Surgeon(s) and Role:    * Lavonia Drafts, MD - Primary    * Peggy Constant, MD  ANESTHESIA:   general  EBL:  Total I/O In: 1400 [I.V.:1400] Out: 450 [Urine:150; Blood:300]  BLOOD ADMINISTERED:none  DRAINS: none   LOCAL MEDICATIONS USED:  MARCAINE     SPECIMEN:  Source of Specimen:  uterus, cervix and fallopian tubes   DISPOSITION OF SPECIMEN:  PATHOLOGY  COUNTS:  YES  TOURNIQUET:  * No tourniquets in log *  DICTATION: .Note written in EPIC  PLAN OF CARE: Admit for overnight observation  PATIENT DISPOSITION:  PACU - hemodynamically stable.   Delay start of Pharmacological VTE agent (>24hrs) due to surgical blood loss or risk of bleeding: yes  Complications : none immediate  Tyrell Seifer L. Harraway-Smith, M.D., Cherlynn June

## 2016-04-05 NOTE — Transfer of Care (Signed)
Immediate Anesthesia Transfer of Care Note  Patient: Hannah Barron  Procedure(s) Performed: Procedure(s): LAPAROSCOPIC ASSISTED VAGINAL HYSTERECTOMY WITH SALPINGECTOMY (Bilateral) CYSTOSCOPY (N/A)  Patient Location: PACU  Anesthesia Type:General  Level of Consciousness: awake, alert , oriented and patient cooperative  Airway & Oxygen Therapy: Patient Spontanous Breathing and Patient connected to nasal cannula oxygen  Post-op Assessment: Report given to RN and Post -op Vital signs reviewed and stable  Post vital signs: Reviewed and stable  Last Vitals:  Vitals:   04/05/16 0650  BP: 135/77  Pulse: 88  Resp: 20  Temp: 37.2 C    Last Pain:  Vitals:   04/05/16 0650  TempSrc: Oral      Patients Stated Pain Goal: 4 (56/38/75 6433)  Complications: No apparent anesthesia complications

## 2016-04-05 NOTE — H&P (Signed)
Preoperative History and Physical  Hannah Barron is a 46 y.o. G1P1001 here for surgical management of AUB and uterine fibroids   Proposed surgery: Laparoscopic assisted vaginal hysterectomy with bilateral salpigectomy  Past Medical History:  Diagnosis Date  . Anemia   . Bronchiectasis ([place])   . COPD (chronic obstructive pulmonary disease) ([place])   . GERD (gastroesophageal reflux disease)   . Migraines   . Paroxysmal SVT (supraventricular tachycardia) ([place])    a. diagnosed in 11/2015.  . Right leg numbness    Past Surgical History:  Procedure Laterality Date  . CESAREAN SECTION    . LUNG BIOPSY    . VIDEO BRONCHOSCOPY Bilateral 03/29/2016   Procedure: VIDEO BRONCHOSCOPY WITHOUT FLUORO;  Surgeon: Marshell Garfinkel, MD;  Location: [facility];  Service: Cardiopulmonary;  Laterality: Bilateral;   OB History    Gravida Para Term Preterm AB Living   _0 SAB TAB Ectopic Multiple Live Births                 Patient denies any cervical dysplasia or STIs. Prescriptions Prior to Admission  Medication Sig Dispense Refill Last Dose  . albuterol (VENTOLIN HFA) 108 (90 Base) MCG/ACT inhaler Inhale 2 puffs into the lungs every 6 (six) hours as needed for wheezing or shortness of breath.   04/04/2016 at Unknown time  . budesonide-formoterol (SYMBICORT) 160-4.5 MCG/ACT inhaler Inhale 2 puffs into the lungs 2 (two) times daily. 1 Inhaler 4 Past Week at Unknown time  . ibuprofen (ADVIL,MOTRIN) 600 MG tablet Take 1 tablet (600 mg total) by mouth every 6 (six) hours as needed for moderate pain. (Patient taking differently: Take 600 mg by mouth every 6 (six) hours as needed for moderate pain. Pain) 30 tablet 1 Past Week at Unknown time  . metoprolol succinate (TOPROL-XL) 50 MG 24 hr tablet Take 1 tablet (50 mg total) by mouth daily. Take with or immediately following a meal. 90 tablet 3 04/05/2016 at 0630  . omeprazole (PRILOSEC) 40 MG capsule Take 40 mg by mouth daily.  5 04/04/2016 at 2130  .  Respiratory Therapy Supplies (FLUTTER) DEVI Use as directed. 1 each 0 Past Week at Unknown time  . RESTASIS 0.05 % ophthalmic emulsion INSTILL 1 DROP IN EACH EYE TWICE DAILY.  2 Past Month at Unknown time  . cetirizine (ZYRTEC) 10 MG tablet Take 1 tablet (10 mg total) by mouth daily as needed for allergies. 10 tablet 0 More than a month at Unknown time    No Known Allergies Social History:   reports that she has never smoked. She has never used smokeless tobacco. She reports that she does not drink alcohol or use drugs. Family History  Problem Relation Age of Onset  . Healthy Mother   . Healthy Father     Review of Systems: Noncontributory  PHYSICAL EXAM: Blood pressure 135/77, pulse 88, temperature 98.9 F (37.2 C), temperature source Oral, resp. rate 20, last menstrual period 03/31/2016, SpO2 100 %. General appearance - alert, well appearing, and in no distress Chest - clear to auscultation, no wheezes, rales or rhonchi, symmetric air entry Heart - normal rate and regular rhythm Abdomen - soft, nontender, nondistended, no masses or organomegaly Pelvic - examination not indicated Extremities - peripheral pulses normal, no pedal edema, no clubbing or cyanosis  Labs: Results for orders placed or performed during the hospital encounter of 04/05/16 (from the past 336 hour(s))  Pregnancy, urine   Collection Time: 04/05/16  6:00 AM  Result Value Ref Range   Preg Test, Ur NEGATIVE NEGATIVE  Results for orders placed or performed in visit on 04/01/16 (from the past 336 hour(s))  Complement, total   Collection Time: 04/01/16  2:53 PM  Result Value Ref Range   Compl, Total (CH50) >60 (H) 31 - 60 U/mL  ANA w/Reflex   Collection Time: 04/01/16  2:53 PM  Result Value Ref Range   Anit Nuclear Antibody(ANA) Negative Negative  C3 and C4   Collection Time: 04/01/16  2:53 PM  Result Value Ref Range   C3 Complement 178 83 - 193 mg/dL   C4 Complement 33 15 - 57 mg/dL  HIV antibody (with  reflex)   Collection Time: 04/01/16  2:53 PM  Result Value Ref Range   HIV 1&2 Ab, 4th Generation NONREACTIVE NONREACTIVE  Fungitell (1-3)-B-D-Glucan   Collection Time: 04/01/16  2:53 PM  Result Value Ref Range   (1-3)-B-D-Glucan <31 pg/mL   Interpretation NEGATIVE   Results for orders placed or performed in visit on 04/01/16 (from the past 336 hour(s))  Cytology - PAP   Collection Time: 04/01/16 12:00 AM  Result Value Ref Range   Adequacy      Satisfactory for evaluation  endocervical/transformation zone component PRESENT.   Diagnosis      NEGATIVE FOR INTRAEPITHELIAL LESIONS OR MALIGNANCY.   HPV DETECTED (A)    Material Submitted CervicoVaginal Pap [ThinPrep Imaged]   Results for orders placed or performed during the hospital encounter of 03/29/16 (from the past 336 hour(s))  Culture, bal-quantitative   Collection Time: 03/29/16  9:56 AM  Result Value Ref Range   Specimen Description BRONCHIAL ALVEOLAR LAVAGE    Special Requests Normal    Gram Stain      RARE WBC PRESENT,BOTH PMN AND MONONUCLEAR NO ORGANISMS SEEN    Culture      NO GROWTH 2 DAYS Performed at [facility], [address], [place],  [address]    Report Status 03/31/2016 FINAL   Pneumocystis smear by DFA   Collection Time: 03/29/16  9:56 AM  Result Value Ref Range   Specimen Source-[facility] BRONCHIAL ALVEOLAR LAVAGE    Pneumocystis jiroveci Ag POSITIVE   Acid Fast Smear (AFB)   Collection Time: 03/29/16  9:56 AM  Result Value Ref Range   AFB Specimen Processing Concentration    Acid Fast Smear Negative    Source (AFB) BRONCHIAL ALVEOLAR LAVAGE   Fungus Culture With Stain   Collection Time: 03/29/16  9:56 AM  Result Value Ref Range   Fungus Stain Final report    Fungus (Mycology) Culture PENDING    Fungal Source BRONCHIAL ALVEOLAR LAVAGE   Fungus Culture Result   Collection Time: 03/29/16  9:56 AM  Result Value Ref Range   Result 1 Comment   Body fluid cell count with differential    Collection Time: 03/29/16  9:56 AM  Result Value Ref Range   Fluid Type-FCT BRONCHIAL ALVEOLAR LAVAGE    Color, Fluid PINK (A) YELLOW   Appearance, Fluid CLOUDY (A) CLEAR   WBC, Fluid 26 0 - 1,000 cu mm   Neutrophil Count, Fluid 50 (H) 0 - 25 %   Lymphs, Fluid 26 %   Monocyte-Macrophage-Serous Fluid 24 (L) 50 - 90 %  Results for orders placed or performed during the hospital encounter of 03/28/16 (from the past 336 hour(s))  Basic metabolic panel   Collection Time: 03/28/16 11:58 AM  Result Value Ref Range   Sodium 138  135 - 145 mmol/L   Potassium 3.4 (L) 3.5 - 5.1 mmol/L   Chloride 106 101 - 111 mmol/L   CO2 23 22 - 32 mmol/L   Glucose, Bld 107 (H) 65 - 99 mg/dL   BUN 12 6 - 20 mg/dL   Creatinine, Ser 0.71 0.44 - 1.00 mg/dL   Calcium 9.3 8.9 - 10.3 mg/dL   GFR calc non Af Amer >60 >60 mL/min   GFR calc Af Amer >60 >60 mL/min   Anion gap 9 5 - 15  CBC   Collection Time: 03/28/16 11:58 AM  Result Value Ref Range   WBC 6.2 4.0 - 10.5 K/uL   RBC 4.17 3.87 - 5.11 MIL/uL   Hemoglobin 13.2 12.0 - 15.0 g/dL   HCT 38.6 36.0 - 46.0 %   MCV 92.6 78.0 - 100.0 fL   MCH 31.7 26.0 - 34.0 pg   MCHC 34.2 30.0 - 36.0 g/dL   RDW 13.0 11.5 - 15.5 %   Platelets 296 150 - 400 K/uL  Type and screen [place]   Collection Time: 03/28/16 11:58 AM  Result Value Ref Range   ABO/RH(D) O POS    Antibody Screen NEG    Sample Expiration 04/11/2016    Extend sample reason NO TRANSFUSIONS OR PREGNANCY IN THE PAST 3 MONTHS   ABO/Rh   Collection Time: 03/28/16 11:58 AM  Result Value Ref Range   ABO/RH(D) O POS   Results for orders placed or performed in visit on 03/24/16 (from the past 336 hour(s))  Cystic Fibrosis Mutation 97   Collection Time: 03/24/16 11:50 AM  Result Value Ref Range   Gene dis anal carrier interp Bld/T-Imp No variant detected.    Cystic Fibrosis Mutation 97 Comment   Rheumatoid Factor   Collection Time: 03/24/16 11:50 AM  Result Value Ref Range    Rhuematoid fact SerPl-aCnc <14 <14 IU/mL  Sed Rate (ESR)   Collection Time: 03/24/16 11:50 AM  Result Value Ref Range   Sed Rate 40 (H) 0 - 20 mm/hr  Aspergillus IgE Panel   Collection Time: 03/24/16 11:50 AM  Result Value Ref Range   Aspergillus IgE Panel <0.10 <0.35 kU/L   Class 0    Results Received-AspIgE <0.10 <0.35 kU/L   Class 0    Aspergillus Versicolor IgE <0.10 <0.35 kU/L   Class 0    Aspergillus nidulans IgE <0.35 <0.35 kU/L   Class 0    Aspergillus flavus IgE <0.35 <0.35 kU/L   Class 0    Aspergillus Terreus IgE <0.35 <0.35 kU/L   Class 0    Aspergillus Amstel/glauc IgE <0.35 <0.35 kU/L   Class 0   IgG Subclasses   Collection Time: 03/24/16 11:50 AM  Result Value Ref Range   IgG Subclass 1 578 382 - 929 mg/dL   IgG Subclass 2 601 241 - 700 mg/dL   IgG Subclass 3 48 22 - 178 mg/dL   IgG Subclass 4 10.1 4.0 - 10.6 mg/dL  Cyclic citrul peptide antibody, IgG   Collection Time: 03/24/16 11:50 AM  Result Value Ref Range   Cyclic Citrullin Peptide Ab <16 Units  ANA w/Reflex   Collection Time: 03/24/16 11:50 AM  Result Value Ref Range   Anit Nuclear Antibody(ANA) Negative Negative    Imaging Studies: Ct Chest Wo Contrast  Result Date: 03/21/2016 CLINICAL DATA:  Follow-up bronchiectasis and pulmonary nodules. EXAM: CT CHEST WITHOUT CONTRAST TECHNIQUE: Multidetector CT imaging of the chest was performed following the standard  protocol without IV contrast. COMPARISON:  07/16/2015 high-resolution chest CT. FINDINGS: Cardiovascular: Normal heart size. No significant pericardial fluid/thickening. Great vessels are normal in course and caliber. Mediastinum/Nodes: No discrete thyroid nodules. Unremarkable esophagus. No pathologically enlarged axillary, mediastinal or gross hilar lymph nodes, noting limited sensitivity for the detection of hilar adenopathy on this noncontrast study. Lungs/Pleura: No pneumothorax. No pleural effusion. Mosaic attenuation throughout both lungs  appears unchanged, due to extensive air trapping as seen on the prior high-resolution chest CT. There is patchy moderate to severe cylindrical and varicoid bronchiectasis throughout both lungs, asymmetrically prominent in the basilar right upper lobe, right middle lobe and medial left lower lobe, with associated bronchial wall thickening and scattered mucoid impaction, not appreciably changed in severity. Scattered solid pulmonary nodules in both lungs in centrilobular and subpleural locations measuring up to the 8 mm in the right upper lobe (series 3/ image 56), 8 mm in the posterior right lower lobe (series 3/ image 88) and 5 mm in the left upper lobe (series 3/ image 63), all stable since 07/16/2015, considered benign. No acute consolidative airspace disease, lung masses or new significant pulmonary nodules. Stable small bandlike opacities at the areas of bronchiectasis with associated distortion and mild volume loss, compatible with postinfectious/ postinflammatory scarring. Upper abdomen: Unremarkable. Musculoskeletal:  No aggressive appearing focal osseous lesions. IMPRESSION: 1. Stable scattered centrilobular and subpleural pulmonary nodules measuring up to 8 mm, for which 8 month stability has been demonstrated, considered benign. 2. No acute disease in the chest. 3. Stable asymmetrically distributed regions of moderate to severe cylindrical and varicoid bronchiectasis throughout both lungs. Stable associated mild regions of postinfectious/postinflammatory scarring. 4. Stable mosaic attenuation throughout both lungs due to extensive air trapping as seen on the prior high-resolution chest CT. Electronically Signed   By: Ilona Sorrel M.D.   On: 03/21/2016 15:19   Dg Chest Port 1 View  Result Date: 03/29/2016 CLINICAL DATA:  Post bilateral Long washings, bronchoalveolar lavage. EXAM: PORTABLE CHEST 1 VIEW COMPARISON:  11/05/2015 FINDINGS: Hazy perihilar opacities likely rib related to the bronchoalveolar  lavage. No pneumothorax. Heart is normal size. No effusions or acute bony abnormality. IMPRESSION: No pneumothorax. Mild hazy perihilar opacities likely related to the bronchoalveolar lavage. Electronically Signed   By: Rolm Baptise M.D.   On: 03/29/2016 10:54    Assessment: Patient Active Problem List   Diagnosis Date Noted  . Fibroids, intramural 04/01/2016  . Abnormal uterine bleeding (AUB) 04/01/2016  . Preoperative clearance 03/18/2016  . Lung nodules 03/16/2016  . Chest pain 11/05/2015  . Chest pressure 11/05/2015  . Palpitations   . GERD (gastroesophageal reflux disease) 07/30/2015  . Bronchiectasis without acute exacerbation ([place]) 07/08/2015  . COPD (chronic obstructive pulmonary disease) ([place]) 06/04/2015  . Fatigue 06/04/2015    Plan: Patient will undergo surgical management with Laparoscopic assisted vaginal hysterectomy with bilateral salpigectomy.   The risks of surgery were discussed in detail with the patient including but not limited to: bleeding which may require transfusion or reoperation; infection which may require antibiotics; injury to surrounding organs which may involve bowel, bladder, ureters ; need for additional procedures including laparoscopy or laparotomy; thromboembolic phenomenon, surgical site problems and other postoperative/anesthesia complications. Likelihood of success in alleviating the patient's condition was discussed. Routine postoperative instructions will be reviewed with the patient and her family in detail after surgery.  The patient concurred with the proposed plan, giving informed written consent for the surgery.  Patient has been NPO since last night she will remain NPO  for procedure.  Anesthesia and OR aware.  Preoperative prophylactic antibiotics and SCDs ordered on call to the OR.  To OR when ready.  Timeka Goette L. Ihor Dow, M.D., [facility] 04/05/2016 7:10 AM

## 2016-04-06 ENCOUNTER — Encounter (HOSPITAL_COMMUNITY): Payer: Self-pay | Admitting: Obstetrics & Gynecology

## 2016-04-06 DIAGNOSIS — D259 Leiomyoma of uterus, unspecified: Secondary | ICD-10-CM | POA: Diagnosis not present

## 2016-04-06 LAB — CBC
HEMATOCRIT: 26.9 % — AB (ref 36.0–46.0)
Hemoglobin: 9.3 g/dL — ABNORMAL LOW (ref 12.0–15.0)
MCH: 32.1 pg (ref 26.0–34.0)
MCHC: 34.6 g/dL (ref 30.0–36.0)
MCV: 92.8 fL (ref 78.0–100.0)
PLATELETS: 249 10*3/uL (ref 150–400)
RBC: 2.9 MIL/uL — ABNORMAL LOW (ref 3.87–5.11)
RDW: 13 % (ref 11.5–15.5)
WBC: 7.5 10*3/uL (ref 4.0–10.5)

## 2016-04-06 MED ORDER — IBUPROFEN 600 MG PO TABS: 600.0000 mg | ORAL_TABLET | Freq: Four times a day (QID) | ORAL | 1 refills | Status: DC | PRN

## 2016-04-06 MED ORDER — DOCUSATE SODIUM 100 MG PO CAPS: 100.0000 mg | ORAL_CAPSULE | Freq: Two times a day (BID) | ORAL | 0 refills | Status: DC

## 2016-04-06 MED ORDER — HYDROCODONE-ACETAMINOPHEN 5-325 MG PO TABS: 1.0000 | ORAL_TABLET | Freq: Four times a day (QID) | ORAL | 0 refills | Status: DC | PRN

## 2016-04-06 NOTE — Progress Notes (Signed)
Pt given discharge instructions and prescriptions, questions answered, states understanding, signs and given copy

## 2016-04-06 NOTE — Discharge Instructions (Signed)
Laparoscopically Assisted Vaginal Hysterectomy, Care After °Refer to this sheet in the next few weeks. These instructions provide you with information on caring for yourself after your procedure. Your health care provider may also give you more specific instructions. Your treatment has been planned according to current medical practices, but problems sometimes occur. Call your health care provider if you have any problems or questions after your procedure. °What can I expect after the procedure? °After your procedure, it is typical to have the following: °· Abdominal pain. You will be given pain medicine to control it. °· Sore throat from the breathing tube that was inserted during surgery. ° °Follow these instructions at home: °· Only take over-the-counter or prescription medicines for pain, discomfort, or fever as directed by your health care provider. °· Do not take aspirin. It can cause bleeding. °· Do not drive when taking pain medicine. °· Follow your health care provider's advice regarding diet, exercise, lifting, driving, and general activities. °· Resume your usual diet as directed and allowed. °· Get plenty of rest and sleep. °· Do not douche, use tampons, or have sexual intercourse for at least 6 weeks, or until your health care provider gives you permission. °· Change your bandages (dressings) as directed by your health care provider. °· Monitor your temperature and notify your health care provider of a fever. °· Take showers instead of baths for 2-3 weeks. °· Do not drink alcohol until your health care provider gives you permission. °· If you develop constipation, you may take a mild laxative with your health care provider's permission. Bran foods may help with constipation problems. Drinking enough fluids to keep your urine clear or pale yellow may help as well. °· Try to have someone home with you for 1-2 weeks to help around the house. °· Keep all of your follow-up appointments as directed by your  health care provider. °Contact a health care provider if: °· You have swelling, redness, or increasing pain around your incision sites. °· You have pus coming from your incision. °· You notice a bad smell coming from your incision. °· Your incision breaks open. °· You feel dizzy or lightheaded. °· You have pain or bleeding when you urinate. °· You have persistent diarrhea. °· You have persistent nausea and vomiting. °· You have abnormal vaginal discharge. °· You have a rash. °· You have any type of abnormal reaction or develop an allergy to your medicine. °· You have poor pain control with your prescribed medicine. °Get help right away if: °· You have a fever. °· You have severe abdominal pain. °· You have chest pain. °· You have shortness of breath. °· You faint. °· You have pain, swelling, or redness in your leg. °· You have heavy vaginal bleeding with blood clots. °This information is not intended to replace advice given to you by your health care provider. Make sure you discuss any questions you have with your health care provider. °Document Released: 12/16/2010 Document Revised: 06/04/2015 Document Reviewed: 07/12/2012 °Elsevier Interactive Patient Education © 2017 [employer] ° °

## 2016-04-06 NOTE — Discharge Summary (Signed)
Physician Discharge Summary  Patient ID: Giavana Rooke MRN: [id number] DOB/AGE: Mar 05, 1970 46 y.o.  Admit date: 04/05/2016 Discharge date: 04/06/2016  Admission Diagnoses: AUB, pelvic pain and uterine fibroids  Discharge Diagnoses:  same  Discharged Condition: good  Hospital Course: Patient had an uncomplicated surgery; for further details of this surgery, please refer to the operative note. Furthermore, the patient had an uncomplicated postoperative course.  By time of discharge, her pain was controlled on oral pain medications; she was ambulating, voiding without difficulty, tolerating regular diet and passing flatus.  She was deemed stable for discharge to home.    Consults: None  Significant Diagnostic Studies: labs: CBC  Treatments: surgery: Laparoscopic assisted vagina hysterectomy with bilateral salpingectomy  Discharge Exam: Blood pressure (!) 95/48, pulse 92, temperature 98.8 F (37.1 C), temperature source Oral, resp. rate 16, last menstrual period 03/31/2016, SpO2 98 %.  I/O last 3 completed shifts: In: 600 [P.O.:600] Out: 2600 [Urine:2600] Total I/O In: 1760.4 [P.O.:1200; I.V.:560.4] Out: 2985 [Urine:2985]   General appearance: alert and no distress Cardio: regular rate and rhythm, S1, S2 normal, no murmur, click, rub or gallop GI: soft, non-tender; bowel sounds normal; no masses,  no organomegaly Extremities: extremities normal, atraumatic, no cyanosis or edema  Incision: clean, dry and intact   CBC    Component Value Date/Time   WBC 7.5 04/06/2016 0539   RBC 2.90 (L) 04/06/2016 0539   HGB 9.3 (L) 04/06/2016 0539   HCT 26.9 (L) 04/06/2016 0539   PLT 249 04/06/2016 0539   MCV 92.8 04/06/2016 0539   MCH 32.1 04/06/2016 0539   MCHC 34.6 04/06/2016 0539   RDW 13.0 04/06/2016 0539   LYMPHSABS 1.9 11/05/2015 2149   MONOABS 0.6 11/05/2015 2149   EOSABS 0.0 11/05/2015 2149   BASOSABS 0.0 11/05/2015 2149    Disposition: 01-Home or Self Care  Discharge  Instructions    Call MD for:  difficulty breathing, headache or visual disturbances    Complete by:  As directed    Call MD for:  extreme fatigue    Complete by:  As directed    Call MD for:  hives    Complete by:  As directed    Call MD for:  persistant dizziness or light-headedness    Complete by:  As directed    Call MD for:  persistant nausea and vomiting    Complete by:  As directed    Call MD for:  redness, tenderness, or signs of infection (pain, swelling, redness, odor or green/yellow discharge around incision site)    Complete by:  As directed    Call MD for:  severe uncontrolled pain    Complete by:  As directed    Call MD for:  temperature >100.4    Complete by:  As directed    Diet - low sodium heart healthy    Complete by:  As directed    Driving Restrictions    Complete by:  As directed    No driving for 2 weeks   Increase activity slowly    Complete by:  As directed    Lifting restrictions    Complete by:  As directed    No heavy lifting for 2 weeks   Sexual Activity Restrictions    Complete by:  As directed    No sexual activity for 6 weeks     Allergies as of 04/06/2016   No Known Allergies     Medication List    TAKE these medications   budesonide-formoterol  160-4.5 MCG/ACT inhaler Commonly known as:  SYMBICORT Inhale 2 puffs into the lungs 2 (two) times daily.   cetirizine 10 MG tablet Commonly known as:  ZYRTEC Take 1 tablet (10 mg total) by mouth daily as needed for allergies.   docusate sodium 100 MG capsule Commonly known as:  COLACE Take 1 capsule (100 mg total) by mouth 2 (two) times daily.   FLUTTER Devi Use as directed.   HYDROcodone-acetaminophen 5-325 MG tablet Commonly known as:  NORCO/VICODIN Take 1-2 tablets by mouth every 6 (six) hours as needed for moderate pain.   ibuprofen 600 MG tablet Commonly known as:  ADVIL,MOTRIN Take 1 tablet (600 mg total) by mouth every 6 (six) hours as needed for moderate pain. What changed:   additional instructions   metoprolol succinate 50 MG 24 hr tablet Commonly known as:  TOPROL-XL Take 1 tablet (50 mg total) by mouth daily. Take with or immediately following a meal.   omeprazole 40 MG capsule Commonly known as:  PRILOSEC Take 40 mg by mouth daily.   RESTASIS 0.05 % ophthalmic emulsion Generic drug:  cycloSPORINE INSTILL 1 DROP IN EACH EYE TWICE DAILY.   VENTOLIN HFA 108 (90 Base) MCG/ACT inhaler Generic drug:  albuterol Inhale 2 puffs into the lungs every 6 (six) hours as needed for wheezing or shortness of breath.      Follow-up Information    Lavonia Drafts, MD Follow up in 2 week(s).   Specialty:  Obstetrics and Gynecology Contact information: [place] [address] [phone]           Signed: Lavonia Drafts 04/06/2016, 11:26 AM

## 2016-04-07 ENCOUNTER — Other Ambulatory Visit: Payer: Self-pay | Admitting: Obstetrics & Gynecology

## 2016-04-07 DIAGNOSIS — R102 Pelvic and perineal pain: Secondary | ICD-10-CM

## 2016-04-07 MED ORDER — INTEGRA F 125-1 MG PO CAPS: 1.0000 | ORAL_CAPSULE | Freq: Every day | ORAL | 2 refills | Status: DC

## 2016-04-11 ENCOUNTER — Inpatient Hospital Stay (HOSPITAL_COMMUNITY)
Admission: AD | Admit: 2016-04-11 | Discharge: 2016-04-11 | Disposition: A | Discharge status: Home / Self Care | Payer: Medicare HMO | Source: Ambulatory Visit | Attending: Obstetrics & Gynecology | Admitting: Obstetrics & Gynecology

## 2016-04-11 ENCOUNTER — Encounter (HOSPITAL_COMMUNITY): Payer: Self-pay | Admitting: *Deleted

## 2016-04-11 DIAGNOSIS — K219 Gastro-esophageal reflux disease without esophagitis: Secondary | ICD-10-CM | POA: Diagnosis not present

## 2016-04-11 DIAGNOSIS — R200 Anesthesia of skin: Secondary | ICD-10-CM | POA: Diagnosis not present

## 2016-04-11 DIAGNOSIS — J449 Chronic obstructive pulmonary disease, unspecified: Secondary | ICD-10-CM | POA: Insufficient documentation

## 2016-04-11 DIAGNOSIS — R42 Dizziness and giddiness: Secondary | ICD-10-CM

## 2016-04-11 DIAGNOSIS — I471 Supraventricular tachycardia: Secondary | ICD-10-CM | POA: Insufficient documentation

## 2016-04-11 DIAGNOSIS — G8918 Other acute postprocedural pain: Secondary | ICD-10-CM | POA: Diagnosis not present

## 2016-04-11 DIAGNOSIS — Z90710 Acquired absence of both cervix and uterus: Secondary | ICD-10-CM | POA: Diagnosis not present

## 2016-04-11 DIAGNOSIS — D649 Anemia, unspecified: Secondary | ICD-10-CM | POA: Insufficient documentation

## 2016-04-11 DIAGNOSIS — G44209 Tension-type headache, unspecified, not intractable: Secondary | ICD-10-CM

## 2016-04-11 DIAGNOSIS — R51 Headache: Secondary | ICD-10-CM

## 2016-04-11 DIAGNOSIS — Z9889 Other specified postprocedural states: Secondary | ICD-10-CM | POA: Diagnosis not present

## 2016-04-11 DIAGNOSIS — T50905A Adverse effect of unspecified drugs, medicaments and biological substances, initial encounter: Secondary | ICD-10-CM

## 2016-04-11 LAB — URINALYSIS, ROUTINE W REFLEX MICROSCOPIC
BILIRUBIN URINE: NEGATIVE
Glucose, UA: NEGATIVE mg/dL
Hgb urine dipstick: NEGATIVE
Ketones, ur: NEGATIVE mg/dL
Leukocytes, UA: NEGATIVE
NITRITE: NEGATIVE
PROTEIN: NEGATIVE mg/dL
SPECIFIC GRAVITY, URINE: 1.014 (ref 1.005–1.030)
pH: 5 (ref 5.0–8.0)

## 2016-04-11 NOTE — MAU Provider Note (Signed)
History     CSN: [id number]  Arrival date and time: 04/11/16 1443   First Provider Initiated Contact with Patient 04/11/16 1857      Chief Complaint  Patient presents with  . Headache  . Dizziness   HPI Hannah Barron is 46 y.o. G1P1001 status post uncomplicated total laproscopic hysterectomy, BSO  (patient of Dr. Ihor Dow)  6 days ago who presents today with headache and dizziness that began 2 days ago.  She states that the symptoms are noticed after she takes Norco that was prescribed for post-op pain, but does not notice these symptoms when she takes OTC ibuprofen.  The headache is described as throbbing and located behind her forehead and top of her head.  She denies any chest pain, SOB, Diarrhea, Vomiting, vaginal bleeding or episodes of fainting.  She feels the headache and dizziness are caused by Norco.    Past Medical History:  Diagnosis Date  . Anemia   . Bronchiectasis ([place])   . COPD (chronic obstructive pulmonary disease) ([place])   . GERD (gastroesophageal reflux disease)   . Migraines   . Paroxysmal SVT (supraventricular tachycardia) ([place])    a. diagnosed in 11/2015.  . Right leg numbness     Past Surgical History:  Procedure Laterality Date  . CESAREAN SECTION    . CYSTOSCOPY N/A 04/05/2016   Procedure: CYSTOSCOPY;  Surgeon: Lavonia Drafts, MD;  Location: [facility];  Service: Gynecology;  Laterality: N/A;  . LAPAROSCOPIC VAGINAL HYSTERECTOMY WITH SALPINGECTOMY Bilateral 04/05/2016   Procedure: LAPAROSCOPIC ASSISTED VAGINAL HYSTERECTOMY WITH SALPINGECTOMY;  Surgeon: Lavonia Drafts, MD;  Location: [facility];  Service: Gynecology;  Laterality: Bilateral;  . LUNG BIOPSY    . VIDEO BRONCHOSCOPY Bilateral 03/29/2016   Procedure: VIDEO BRONCHOSCOPY WITHOUT FLUORO;  Surgeon: Marshell Garfinkel, MD;  Location: [facility];  Service: Cardiopulmonary;  Laterality: Bilateral;    Family History  Problem Relation Age of Onset  . Healthy Mother   . Healthy Father      Social History  Substance Use Topics  . Smoking status: Never Smoker  . Smokeless tobacco: Never Used  . Alcohol use No    Allergies: No Known Allergies  Prescriptions Prior to Admission  Medication Sig Dispense Refill Last Dose  . albuterol (VENTOLIN HFA) 108 (90 Base) MCG/ACT inhaler Inhale 2 puffs into the lungs every 6 (six) hours as needed for wheezing or shortness of breath.   04/04/2016 at Unknown time  . budesonide-formoterol (SYMBICORT) 160-4.5 MCG/ACT inhaler Inhale 2 puffs into the lungs 2 (two) times daily. 1 Inhaler 4 Past Week at Unknown time  . cetirizine (ZYRTEC) 10 MG tablet Take 1 tablet (10 mg total) by mouth daily as needed for allergies. 10 tablet 0 More than a month at Unknown time  . docusate sodium (COLACE) 100 MG capsule Take 1 capsule (100 mg total) by mouth 2 (two) times daily. 10 capsule 0   . Fe Fum-FePoly-FA-Vit C-Vit B3 (INTEGRA F) 125-1 MG CAPS Take 1 capsule by mouth daily. 30 capsule 2   . HYDROcodone-acetaminophen (NORCO/VICODIN) 5-325 MG tablet Take 1-2 tablets by mouth every 6 (six) hours as needed for moderate pain. 30 tablet 0   . ibuprofen (ADVIL,MOTRIN) 600 MG tablet Take 1 tablet (600 mg total) by mouth every 6 (six) hours as needed for moderate pain. 30 tablet 1   . metoprolol succinate (TOPROL-XL) 50 MG 24 hr tablet Take 1 tablet (50 mg total) by mouth daily. Take with or immediately following a meal. 90 tablet 3 04/05/2016 at  0630  . omeprazole (PRILOSEC) 40 MG capsule Take 40 mg by mouth daily.  5 04/04/2016 at 2130  . Respiratory Therapy Supplies (FLUTTER) DEVI Use as directed. 1 each 0 Past Week at Unknown time  . RESTASIS 0.05 % ophthalmic emulsion INSTILL 1 DROP IN EACH EYE TWICE DAILY.  2 Past Month at Unknown time    Review of Systems  Constitutional: Negative.   HENT: Negative.   Eyes: Negative for visual disturbance.  Respiratory: Negative.  Negative for chest tightness.   Cardiovascular: Negative.   Gastrointestinal: Positive for  abdominal pain.  Endocrine: Negative.   Genitourinary: Negative.   Musculoskeletal: Negative.   Allergic/Immunologic: Negative.   Neurological: Positive for dizziness and headaches.  Hematological: Negative.   Psychiatric/Behavioral: Negative.    Physical Exam   Blood pressure 129/66, pulse (!) 113, temperature 98.9 F (37.2 C), temperature source Oral, resp. rate 16, height 5\' 7"  (1.702 m), weight 167 lb (75.8 kg), last menstrual period 03/31/2016, SpO2 100 %.  Physical Exam  Constitutional: She is oriented to person, place, and time. She appears well-developed and well-nourished. No distress.  HENT:  Head: Normocephalic and atraumatic.  Eyes: EOM are normal. Pupils are equal, round, and reactive to light.  Cardiovascular: Normal rate, regular rhythm, normal heart sounds and intact distal pulses.  Exam reveals no gallop and no friction rub.   No murmur heard. Respiratory: Effort normal and breath sounds normal.  GI: Soft. Bowel sounds are normal. There is tenderness (mild).  Surgical incision sites without redness, pain or signs of infection  Genitourinary:  Genitourinary Comments: Pelvic exam deferred.  Lymphadenopathy:    She has no cervical adenopathy.  Neurological: She is alert and oriented to person, place, and time. No cranial nerve deficit.  Skin: Skin is warm and dry. No rash noted. No erythema. No pallor.  Psychiatric: She has a normal mood and affect. Her behavior is normal.   Results for orders placed or performed during the hospital encounter of 04/11/16 (from the past 24 hour(s))  Urinalysis, Routine w reflex microscopic     Status: None   Collection Time: 04/11/16  3:04 PM  Result Value Ref Range   Color, Urine YELLOW YELLOW   APPearance CLEAR CLEAR   Specific Gravity, Urine 1.014 1.005 - 1.030   pH 5.0 5.0 - 8.0   Glucose, UA NEGATIVE NEGATIVE mg/dL   Hgb urine dipstick NEGATIVE NEGATIVE   Bilirubin Urine NEGATIVE NEGATIVE   Ketones, ur NEGATIVE NEGATIVE  mg/dL   Protein, ur NEGATIVE NEGATIVE mg/dL   Nitrite NEGATIVE NEGATIVE   Leukocytes, UA NEGATIVE NEGATIVE   MAU Course  Procedures  MDM MSE Exam Consulted with Dr. Roselie Awkward.  He agreed to D/C use of Norco and use Ibuprofen for mild incisional pain.  Assessment and Plan  A: Headache and dizziness thought to be from [place]- pain med  P:  Patient not tolerating current NORCO regimen for post-op pain.      Will advise patient to discontinue NORCO and take Ibuprofen as needed for pain and     headache.     Keep schedule appointment for post op eval with Dr. Ihor Dow     Call for worsening sxs.   Waynetta Sandy Monesha Monreal 04/11/2016, 7:27 PM   I confirm that I have verified the information documented in the student physician assistant's note and that I have also personally performed the physical exam and all medical decision making activities.

## 2016-04-11 NOTE — MAU Note (Signed)
Pt had a lap hysterectomy last Wednesday and has had a severe headache and dizziness since Saturday.  Has tried ibuprofen but it has not helped.  Last time she took it was last night.

## 2016-04-11 NOTE — Discharge Instructions (Signed)
General Headache Without Cause A headache is pain or discomfort felt around the head or neck area. There are many causes and types of headaches. In some cases, the cause may not be found. Follow these instructions at home: Managing pain   Take over-the-counter and prescription medicines only as told by your doctor.  Lie down in a dark, quiet room when you have a headache.  If directed, apply ice to the head and neck area:  Put ice in a plastic bag.  Place a towel between your skin and the bag.  Leave the ice on for 20 minutes, 2-3 times per day.  Use a heating pad or hot shower to apply heat to the head and neck area as told by your doctor.  Keep lights dim if bright lights bother you or make your headaches worse. Eating and drinking   Eat meals on a regular schedule.  Lessen how much alcohol you drink.  Lessen how much caffeine you drink, or stop drinking caffeine. General instructions   Keep all follow-up visits as told by your doctor. This is important.  Keep a journal to find out if certain things bring on headaches. For example, write down:  What you eat and drink.  How much sleep you get.  Any change to your diet or medicines.  Relax by getting a massage or doing other relaxing activities.  Lessen stress.  Sit up straight. Do not tighten (tense) your muscles.  Do not use tobacco products. This includes cigarettes, chewing tobacco, or e-cigarettes. If you need help quitting, ask your doctor.  Exercise regularly as told by your doctor.  Get enough sleep. This often means 7-9 hours of sleep. Contact a doctor if:  Your symptoms are not helped by medicine.  You have a headache that feels different than the other headaches.  You feel sick to your stomach (nauseous) or you throw up (vomit).  You have a fever. Get help right away if:  Your headache becomes really bad.  You keep throwing up.  You have a stiff neck.  You have trouble seeing.  You have  trouble speaking.  You have pain in the eye or ear.  Your muscles are weak or you lose muscle control.  You lose your balance or have trouble walking.  You feel like you will pass out (faint) or you pass out.  You have confusion. This information is not intended to replace advice given to you by your health care provider. Make sure you discuss any questions you have with your health care provider. Document Released: 10/06/2007 Document Revised: 06/04/2015 Document Reviewed: 04/21/2014 Elsevier Interactive Patient Education  2017 [employer]. Pain Relief Preoperatively and Postoperatively Everyone experiences pain differently, and you have the right to have your pain evaluated and managed. If you have questions, problems, or concerns about pain that you may feel before surgery (preoperatively) or after surgery (postoperatively), tell your health care provider. Severe pain after surgery--and the fear or worry associated with that pain--may cause extreme discomfort that:  Prevents sleep.  Decreases the ability to breathe deeply and to cough. This can result in pneumonia or upper airway infections.  Causes the heart to beat more quickly and the blood pressure to be higher.  Increases the risk for constipation and bloating.  Interferes with wound healing.  May result in depression, increased worry, and feelings of helplessness. Relieving pain before surgery is also important because it lessens the pain that you will have after surgery. Patients who receive pain  relief both before and after surgery experience greater pain relief than patients who receive pain relief only after surgery. If you have pain that is not controlled by medicine, tell your health care provider. Thisis very important. If you become constipated after taking pain medicine, drink more fluids or take a laxative as told by your health care provider. Pain control methods Your health care provider follows guidelines about  the management of your pain. These guidelines should be explained to you before your procedure. Work with your health care provider to plan for your postoperative pain relief. Make sure that you fully understand and agree with this plan. You should nothesitate to ask questions about the care that you are receiving. Your health care provider may use more than one method at the same time to help relieve your pain (multimodal analgesia). Using this approach has many benefits for you, which may include being able to eat, move around, and possibly leave the hospital sooner. Opioids  Opioids are substances that relieve pain by binding to pain receptors in the brain and spinal cord (narcotic pain medicines). Opioids may help to relieve short-term (acute) postoperative pain that is moderate to moderately severe.  Opioids are often combined with non-narcotic medicines to improve pain relief, lower the risk of side effects, and reduce the chance of addiction.  To help prevent addiction, opioids are given for short periods of time in careful doses.If you follow instructions from your health care provider about taking opioids and you do not have a history of substance abuse, your risk of becoming addicted to opioids is low. As-Needed Pain Control  You can receive pain medicine when you need it through an IV tube inserted into one of your veins, or through other forms such as a pill or liquid that you can swallow. When you tell your health care provider that you are having pain, he or she will give you the proper pain medicine. IV Patient-Controlled Analgesia (PCA) Pump  You can receive pain medicine through an IV tube that is connected to a PCA pump. The PCA pump gives you a specific amount of medicine when you push a button. This lets you control how much medicine you receive. The pump is set up so that you cannot accidentally give yourself too much medicine.  This button should be pushed only by you or by  someone who is specifically assigned by you to do so.  You will be able to start using your PCA pump in the recovery room after your procedure.  Tell your health care provider:  If you are having too much pain.  If you are feeling too sleepy or nauseous. Continuous Epidural Pain Control  You can receive pain medicine through a thin, flexible tube (catheter) that is inserted into your back, near your spinal cord. Medicine flows through the catheter to reduce pain in areas of your body that are below the catheter.  This method may be recommended if you are having surgery on your abdomen, hip area, or legs.  The catheter is usually put into the back shortly before surgery. It may be left in until you can eat, take medicine by mouth, pass urine, and have a bowel movement.  This method of pain relief may help you to heal more quickly because you may be able to do these things sooner:  Regain normal bowel and bladder function.  Return to eating.  Get up and walk. Medicine That Numbs the Area (Local Anesthetic)  You may be  given pain medicine:  As an injection near your painful area (local infiltration).  As an injection near the nerve that provides feeling to a specific part of your body (peripheral nerve block).  As an injection in your spine (spinal block).  Through a local anesthetic reservoir pump. This means that one or more catheters are inserted into your incision at the end of your procedure. These catheters are connected to a device that is filled with a non-narcotic pain medicine. Medicine gradually empties into your incision site over the next several days. Other Methods of Pain Control  Steroids.  Physical therapy.  Heat and cold therapy.  Applying pressure (compression) to the painful area, such as wrapping an elastic bandage around the area.  Massage. This information is not intended to replace advice given to you by your health care provider. Make sure you  discuss any questions you have with your health care provider. Document Released: 03/19/2002 Document Revised: 06/01/2015 Document Reviewed: 09/10/2014 Elsevier Interactive Patient Education  2017 [employer].

## 2016-04-20 NOTE — Progress Notes (Signed)
Called patient and informed her of Rx. Patient verbalized understanding and had no questions

## 2016-04-21 ENCOUNTER — Ambulatory Visit: Payer: Medicare HMO | Admitting: Obstetrics and Gynecology

## 2016-04-21 ENCOUNTER — Encounter: Payer: Self-pay | Admitting: Obstetrics and Gynecology

## 2016-04-21 VITALS — BP 99/59 | HR 79 | Wt 169.7 lb

## 2016-04-21 DIAGNOSIS — Z9889 Other specified postprocedural states: Secondary | ICD-10-CM

## 2016-04-21 NOTE — Progress Notes (Signed)
46 yo G1P1 s/p LAVH on 3/26 presenting today for post op check. Patient reports feeling well. She has gradually returned to her regular activities. She continues to experience some mild lower abdominal discomfort which is well controlled with ibuprofen. She denies fever, chills, or abnormal drainage from her incisions.  Past Medical History:  Diagnosis Date  . Anemia   . Bronchiectasis ([place])   . COPD (chronic obstructive pulmonary disease) ([place])   . GERD (gastroesophageal reflux disease)   . Migraines   . Paroxysmal SVT (supraventricular tachycardia) ([place])    a. diagnosed in 11/2015.  . Right leg numbness    Past Surgical History:  Procedure Laterality Date  . CESAREAN SECTION    . CYSTOSCOPY N/A 04/05/2016   Procedure: CYSTOSCOPY;  Surgeon: Lavonia Drafts, MD;  Location: [facility];  Service: Gynecology;  Laterality: N/A;  . LAPAROSCOPIC VAGINAL HYSTERECTOMY WITH SALPINGECTOMY Bilateral 04/05/2016   Procedure: LAPAROSCOPIC ASSISTED VAGINAL HYSTERECTOMY WITH SALPINGECTOMY;  Surgeon: Lavonia Drafts, MD;  Location: [facility];  Service: Gynecology;  Laterality: Bilateral;  . LUNG BIOPSY    . VIDEO BRONCHOSCOPY Bilateral 03/29/2016   Procedure: VIDEO BRONCHOSCOPY WITHOUT FLUORO;  Surgeon: Marshell Garfinkel, MD;  Location: [facility];  Service: Cardiopulmonary;  Laterality: Bilateral;   Family History  Problem Relation Age of Onset  . Healthy Mother   . Healthy Father    Social History  Substance Use Topics  . Smoking status: Never Smoker  . Smokeless tobacco: Never Used  . Alcohol use No   ROS See pertinent in HPI  Blood pressure (!) 99/59, pulse 79, weight 169 lb 11.2 oz (77 kg), last menstrual period 03/31/2016. GENERAL: Well-developed, well-nourished female in no acute distress.  LUNGS: Clear to auscultation bilaterally.  HEART: Regular rate and rhythm. ABDOMEN: Soft, nontender, nondistended.  Incision : clean, no erythema, induration or drainage x 3 PELVIC: Normal  external female genitalia. Vagina is pink and rugated. Vaginal vault intact EXTREMITIES: No cyanosis, clubbing, or edema, 2+ distal pulses.  A/P 46 yo here for post op check s/p LAVH - Encouraged patient to return to baseline pre-op activities - Continue pain management with ibuprofen - Instructed patient to scrub off dermabond - Follow up with Dr. Ihor Dow for post op check in 4 weeks - RTC prn

## 2016-04-27 LAB — FUNGUS CULTURE WITH STAIN

## 2016-04-27 LAB — FUNGAL ORGANISM REFLEX

## 2016-04-27 LAB — FUNGUS CULTURE RESULT

## 2016-04-28 ENCOUNTER — Other Ambulatory Visit (INDEPENDENT_AMBULATORY_CARE_PROVIDER_SITE_OTHER): Payer: Medicare HMO

## 2016-04-28 ENCOUNTER — Ambulatory Visit (INDEPENDENT_AMBULATORY_CARE_PROVIDER_SITE_OTHER): Payer: Medicare HMO | Admitting: Family

## 2016-04-28 ENCOUNTER — Encounter: Payer: Self-pay | Admitting: Family

## 2016-04-28 VITALS — BP 114/66 | HR 83 | Temp 98.3°F | Resp 16 | Ht 67.0 in | Wt 171.1 lb

## 2016-04-28 DIAGNOSIS — R102 Pelvic and perineal pain unspecified side: Secondary | ICD-10-CM

## 2016-04-28 DIAGNOSIS — J449 Chronic obstructive pulmonary disease, unspecified: Secondary | ICD-10-CM | POA: Diagnosis not present

## 2016-04-28 DIAGNOSIS — Z0000 Encounter for general adult medical examination without abnormal findings: Secondary | ICD-10-CM | POA: Diagnosis not present

## 2016-04-28 DIAGNOSIS — Z0001 Encounter for general adult medical examination with abnormal findings: Secondary | ICD-10-CM | POA: Insufficient documentation

## 2016-04-28 LAB — CBC
HEMATOCRIT: 34.7 % — AB (ref 36.0–46.0)
Hemoglobin: 11.5 g/dL — ABNORMAL LOW (ref 12.0–15.0)
MCHC: 33.1 g/dL (ref 30.0–36.0)
MCV: 96.9 fl (ref 78.0–100.0)
Platelets: 386 10*3/uL (ref 150.0–400.0)
RBC: 3.58 Mil/uL — ABNORMAL LOW (ref 3.87–5.11)
RDW: 14.1 % (ref 11.5–15.5)
WBC: 6 10*3/uL (ref 4.0–10.5)

## 2016-04-28 LAB — COMPREHENSIVE METABOLIC PANEL
ALBUMIN: 4 g/dL (ref 3.5–5.2)
ALK PHOS: 72 U/L (ref 39–117)
ALT: 10 U/L (ref 0–35)
AST: 13 U/L (ref 0–37)
BUN: 12 mg/dL (ref 6–23)
CALCIUM: 9.2 mg/dL (ref 8.4–10.5)
CHLORIDE: 106 meq/L (ref 96–112)
CO2: 23 mEq/L (ref 19–32)
Creatinine, Ser: 0.73 mg/dL (ref 0.40–1.20)
GFR: 91.17 mL/min (ref >60.00)
Glucose, Bld: 104 mg/dL — ABNORMAL HIGH (ref 70–99)
POTASSIUM: 3.6 meq/L (ref 3.5–5.1)
Sodium: 138 mEq/L (ref 135–145)
Total Bilirubin: 0.2 mg/dL (ref 0.2–1.2)
Total Protein: 7.4 g/dL (ref 6.0–8.3)

## 2016-04-28 LAB — LIPID PANEL
CHOLESTEROL: 194 mg/dL (ref 0–200)
HDL: 62 mg/dL (ref >39.00)
LDL Cholesterol: 111 mg/dL — ABNORMAL HIGH (ref 0–99)
NonHDL: 132.3
Total CHOL/HDL Ratio: 3
Triglycerides: 108 mg/dL (ref 0.0–149.0)
VLDL: 21.6 mg/dL (ref 0.0–40.0)

## 2016-04-28 MED ORDER — OMEPRAZOLE 40 MG PO CPDR: 40.0000 mg | DELAYED_RELEASE_CAPSULE | Freq: Every day | ORAL | 2 refills | Status: DC

## 2016-04-28 MED ORDER — IBUPROFEN 600 MG PO TABS: 600.0000 mg | ORAL_TABLET | Freq: Four times a day (QID) | ORAL | 1 refills | Status: DC | PRN

## 2016-04-28 MED ORDER — TRAZODONE HCL 50 MG PO TABS: 25.0000 mg | ORAL_TABLET | Freq: Every evening | ORAL | 1 refills | Status: DC | PRN

## 2016-04-28 NOTE — Assessment & Plan Note (Signed)
Reviewed and updated patient's medical, surgical, family and social history. Medications and allergies were also reviewed. Basic screenings for depression, activities of daily living, hearing, cognition and safety were performed. Provider list was updated and health plan was provided to the patient.  

## 2016-04-28 NOTE — Patient Instructions (Addendum)
Thank you for choosing [facility].  SUMMARY AND INSTRUCTIONS:  Medication:  Your prescription(s) have been submitted to your pharmacy or been printed and provided for you. Please take as directed and contact our office if you believe you are having problem(s) with the medication(s) or have any questions.  Labs:  Please stop by the lab on the lower level of the building for your blood work. Your results will be released to [place] (or called to you) after review, usually within 72 hours after test completion. If any changes need to be made, you will be notified at that same time.  1.) The lab is open from 7:30am to 5:30 pm Monday-Friday 2.) No appointment is necessary 3.) Fasting (if needed) is 6-8 hours after food and drink; black coffee and water are okay   Follow up:  If your symptoms worsen or fail to improve, please contact our office for further instruction, or in case of emergency go directly to the emergency room at the closest medical facility.    Health Maintenance  Topic Date Due  . INFLUENZA VACCINE  08/10/2016  . PAP SMEAR  04/02/2019  . TETANUS/TDAP  11/10/2024  . HIV Screening  Completed    Health Maintenance, Female Adopting a healthy lifestyle and getting preventive care can go a long way to promote health and wellness. Talk with your health care provider about what schedule of regular examinations is right for you. This is a good chance for you to check in with your provider about disease prevention and staying healthy. In between checkups, there are plenty of things you can do on your own. Experts have done a lot of research about which lifestyle changes and preventive measures are most likely to keep you healthy. Ask your health care provider for more information. Weight and diet Eat a healthy diet  Be sure to include plenty of vegetables, fruits, low-fat dairy products, and lean protein.  Do not eat a lot of foods high in solid fats, added sugars, or  salt.  Get regular exercise. This is one of the most important things you can do for your health.  Most adults should exercise for at least 150 minutes each week. The exercise should increase your heart rate and make you sweat (moderate-intensity exercise).  Most adults should also do strengthening exercises at least twice a week. This is in addition to the moderate-intensity exercise. Maintain a healthy weight  Body mass index (BMI) is a measurement that can be used to identify possible weight problems. It estimates body fat based on height and weight. Your health care provider can help determine your BMI and help you achieve or maintain a healthy weight.  For females 61 years of age and older:  A BMI below 18.5 is considered underweight.  A BMI of 18.5 to 24.9 is normal.  A BMI of 25 to 29.9 is considered overweight.  A BMI of 30 and above is considered obese. Watch levels of cholesterol and blood lipids  You should start having your blood tested for lipids and cholesterol at 46 years of age, then have this test every 5 years.  You may need to have your cholesterol levels checked more often if:  Your lipid or cholesterol levels are high.  You are older than 46 years of age.  You are at high risk for heart disease. Cancer screening Lung Cancer  Lung cancer screening is recommended for adults 6-7 years old who are at high risk for lung cancer because of  a history of smoking.  A yearly low-dose CT scan of the lungs is recommended for people who:  Currently smoke.  Have quit within the past 15 years.  Have at least a 30-pack-year history of smoking. A pack year is smoking an average of one pack of cigarettes a day for 1 year.  Yearly screening should continue until it has been 15 years since you quit.  Yearly screening should stop if you develop a health problem that would prevent you from having lung cancer treatment. Breast Cancer  Practice breast self-awareness.  This means understanding how your breasts normally appear and feel.  It also means doing regular breast self-exams. Let your health care provider know about any changes, no matter how small.  If you are in your 20s or 30s, you should have a clinical breast exam (CBE) by a health care provider every 1-3 years as part of a regular health exam.  If you are 49 or older, have a CBE every year. Also consider having a breast X-ray (mammogram) every year.  If you have a family history of breast cancer, talk to your health care provider about genetic screening.  If you are at high risk for breast cancer, talk to your health care provider about having an MRI and a mammogram every year.  Breast cancer gene (BRCA) assessment is recommended for women who have family members with BRCA-related cancers. BRCA-related cancers include:  Breast.  Ovarian.  Tubal.  Peritoneal cancers.  Results of the assessment will determine the need for genetic counseling and BRCA1 and BRCA2 testing. Cervical Cancer  Your health care provider may recommend that you be screened regularly for cancer of the pelvic organs (ovaries, uterus, and vagina). This screening involves a pelvic examination, including checking for microscopic changes to the surface of your cervix (Pap test). You may be encouraged to have this screening done every 3 years, beginning at age 48.  For women ages 56-65, health care providers may recommend pelvic exams and Pap testing every 3 years, or they may recommend the Pap and pelvic exam, combined with testing for human papilloma virus (HPV), every 5 years. Some types of HPV increase your risk of cervical cancer. Testing for HPV may also be done on women of any age with unclear Pap test results.  Other health care providers may not recommend any screening for nonpregnant women who are considered low risk for pelvic cancer and who do not have symptoms. Ask your health care provider if a screening pelvic  exam is right for you.  If you have had past treatment for cervical cancer or a condition that could lead to cancer, you need Pap tests and screening for cancer for at least 20 years after your treatment. If Pap tests have been discontinued, your risk factors (such as having a new sexual partner) need to be reassessed to determine if screening should resume. Some women have medical problems that increase the chance of getting cervical cancer. In these cases, your health care provider may recommend more frequent screening and Pap tests. Colorectal Cancer  This type of cancer can be detected and often prevented.  Routine colorectal cancer screening usually begins at 46 years of age and continues through 46 years of age.  Your health care provider may recommend screening at an earlier age if you have risk factors for colon cancer.  Your health care provider may also recommend using home test kits to check for hidden blood in the stool.  A small camera  at the end of a tube can be used to examine your colon directly (sigmoidoscopy or colonoscopy). This is done to check for the earliest forms of colorectal cancer.  Routine screening usually begins at age 64.  Direct examination of the colon should be repeated every 5-10 years through 46 years of age. However, you may need to be screened more often if early forms of precancerous polyps or small growths are found. Skin Cancer  Check your skin from head to toe regularly.  Tell your health care provider about any new moles or changes in moles, especially if there is a change in a mole's shape or color.  Also tell your health care provider if you have a mole that is larger than the size of a pencil eraser.  Always use sunscreen. Apply sunscreen liberally and repeatedly throughout the day.  Protect yourself by wearing long sleeves, pants, a wide-brimmed hat, and sunglasses whenever you are outside. Heart disease, diabetes, and high blood  pressure  High blood pressure causes heart disease and increases the risk of stroke. High blood pressure is more likely to develop in:  People who have blood pressure in the high end of the normal range (130-139/85-89 mm Hg).  People who are overweight or obese.  People who are African American.  If you are 85-31 years of age, have your blood pressure checked every 3-5 years. If you are 22 years of age or older, have your blood pressure checked every year. You should have your blood pressure measured twice-once when you are at a hospital or clinic, and once when you are not at a hospital or clinic. Record the average of the two measurements. To check your blood pressure when you are not at a hospital or clinic, you can use:  An automated blood pressure machine at a pharmacy.  A home blood pressure monitor.  If you are between 49 years and 1 years old, ask your health care provider if you should take aspirin to prevent strokes.  Have regular diabetes screenings. This involves taking a blood sample to check your fasting blood sugar level.  If you are at a normal weight and have a low risk for diabetes, have this test once every three years after 46 years of age.  If you are overweight and have a high risk for diabetes, consider being tested at a younger age or more often. Preventing infection Hepatitis B  If you have a higher risk for hepatitis B, you should be screened for this virus. You are considered at high risk for hepatitis B if:  You were born in a country where hepatitis B is common. Ask your health care provider which countries are considered high risk.  Your parents were born in a high-risk country, and you have not been immunized against hepatitis B (hepatitis B vaccine).  You have HIV or AIDS.  You use needles to inject street drugs.  You live with someone who has hepatitis B.  You have had sex with someone who has hepatitis B.  You get hemodialysis  treatment.  You take certain medicines for conditions, including cancer, organ transplantation, and autoimmune conditions. Hepatitis C  Blood testing is recommended for:  Everyone born from [birth year] through 1965.  Anyone with known risk factors for hepatitis C. Sexually transmitted infections (STIs)  You should be screened for sexually transmitted infections (STIs) including gonorrhea and chlamydia if:  You are sexually active and are younger than 46 years of age.  You are older  than 46 years of age and your health care provider tells you that you are at risk for this type of infection.  Your sexual activity has changed since you were last screened and you are at an increased risk for chlamydia or gonorrhea. Ask your health care provider if you are at risk.  If you do not have HIV, but are at risk, it may be recommended that you take a prescription medicine daily to prevent HIV infection. This is called pre-exposure prophylaxis (PrEP). You are considered at risk if:  You are sexually active and do not regularly use condoms or know the HIV status of your partner(s).  You take drugs by injection.  You are sexually active with a partner who has HIV. Talk with your health care provider about whether you are at high risk of being infected with HIV. If you choose to begin PrEP, you should first be tested for HIV. You should then be tested every 3 months for as long as you are taking PrEP. Pregnancy  If you are premenopausal and you may become pregnant, ask your health care provider about preconception counseling.  If you may become pregnant, take 400 to 800 micrograms (mcg) of folic acid every day.  If you want to prevent pregnancy, talk to your health care provider about birth control (contraception). Osteoporosis and menopause  Osteoporosis is a disease in which the bones lose minerals and strength with aging. This can result in serious bone fractures. Your risk for osteoporosis can be  identified using a bone density scan.  If you are 40 years of age or older, or if you are at risk for osteoporosis and fractures, ask your health care provider if you should be screened.  Ask your health care provider whether you should take a calcium or vitamin D supplement to lower your risk for osteoporosis.  Menopause may have certain physical symptoms and risks.  Hormone replacement therapy may reduce some of these symptoms and risks. Talk to your health care provider about whether hormone replacement therapy is right for you. Follow these instructions at home:  Schedule regular health, dental, and eye exams.  Stay current with your immunizations.  Do not use any tobacco products including cigarettes, chewing tobacco, or electronic cigarettes.  If you are pregnant, do not drink alcohol.  If you are breastfeeding, limit how much and how often you drink alcohol.  Limit alcohol intake to no more than 1 drink per day for nonpregnant women. One drink equals 12 ounces of beer, 5 ounces of wine, or 1 ounces of hard liquor.  Do not use street drugs.  Do not share needles.  Ask your health care provider for help if you need support or information about quitting drugs.  Tell your health care provider if you often feel depressed.  Tell your health care provider if you have ever been abused or do not feel safe at home. This information is not intended to replace advice given to you by your health care provider. Make sure you discuss any questions you have with your health care provider. Document Released: 07/12/2010 Document Revised: 06/04/2015 Document Reviewed: 09/30/2014 Elsevier Interactive Patient Education  2017 [place] Directive Advance directives are legal documents that let you make choices ahead of time about your health care and medical treatment in case you become unable to communicate for yourself. Advance directives are a way for you to communicate your  wishes to family, friends, and health care providers. This can  help convey your decisions about end-of-life care if you become unable to communicate. Discussing and writing advance directives should happen over time rather than all at once. Advance directives can be changed depending on your situation and what you want, even after you have signed the advance directives. If you do not have an advance directive, some states assign family decision makers to act on your behalf based on how closely you are related to them. Each state has its own laws regarding advance directives. You may want to check with your health care provider, attorney, or state representative about the laws in your state. There are different types of advance directives, such as:  Medical power of attorney.  Living will.  Do not resuscitate (DNR) or do not attempt resuscitation (DNAR) order. Health care proxy and medical power of attorney A health care proxy, also called a health care agent, is a person who is appointed to make medical decisions for you in cases in which you are unable to make the decisions yourself. Generally, people choose someone they know well and trust to represent their preferences. Make sure to ask this person for an agreement to act as your proxy. A proxy may have to exercise judgment in the event of a medical decision for which your wishes are not known. A medical power of attorney is a legal document that names your health care proxy. Depending on the laws in your state, after the document is written, it may also need to be:  Signed.  Notarized.  Dated.  Copied.  Witnessed.  Incorporated into your medical record. You may also want to appoint someone to manage your financial affairs in a situation in which you are unable to do so. This is called a durable power of attorney for finances. It is a separate legal document from the durable power of attorney for health care. You may choose the same person  or someone different from your health care proxy to act as your agent in financial matters. If you do not appoint a proxy, or if there is a concern that the proxy is not acting in your best interests, a court-appointed guardian may be designated to act on your behalf. Living will A living will is a set of instructions documenting your wishes about medical care when you cannot express them yourself. Health care providers should keep a copy of your living will in your medical record. You may want to give a copy to family members or friends. To alert caregivers in case of an emergency, you can place a card in your wallet to let them know that you have a living will and where they can find it. A living will is used if you become:  Terminally ill.  Incapacitated.  Unable to communicate or make decisions. Items to consider in your living will include:  The use or non-use of life-sustaining equipment, such as dialysis machines and breathing machines (ventilators).  A DNR or DNAR order, which is the instruction not to use cardiopulmonary resuscitation (CPR) if breathing or heartbeat stops.  The use or non-use of tube feeding.  Withholding of food and fluids.  Comfort (palliative) care when the goal becomes comfort rather than a cure.  Organ and tissue donation. A living will does not give instructions for distributing your money and property if you should pass away. It is recommended that you seek the advice of a lawyer when writing a will. Decisions about taxes, beneficiaries, and asset distribution will be legally binding.  This process can relieve your family and friends of any concerns surrounding disputes or questions that may come up about the distribution of your assets. DNR or DNAR A DNR or DNAR order is a request not to have CPR in the event that your heart stops beating or you stop breathing. If a DNR or DNAR order has not been made and shared, a health care provider will try to help any  patient whose heart has stopped or who has stopped breathing. If you plan to have surgery, talk with your health care provider about how your DNR or DNAR order will be followed if problems occur. Summary  Advance directives are the legal documents that allow you to make choices ahead of time about your health care and medical treatment in case you become unable to communicate for yourself.  The process of discussing and writing advance directives should happen over time. You can change the advance directives, even after you have signed them.  Advance directives include DNR or DNAR orders, living wills, and designating an agent as your medical power of attorney. This information is not intended to replace advice given to you by your health care provider. Make sure you discuss any questions you have with your health care provider. Document Released: 04/05/2007 Document Revised: 11/16/2015 Document Reviewed: 11/16/2015 Elsevier Interactive Patient Education  2017 [employer].

## 2016-04-28 NOTE — Progress Notes (Signed)
Subjective:    Patient ID: Hannah Barron, female    DOB: 1970-07-21, 46 y.o.   MRN: [id number]  Chief Complaint  Patient presents with  . CPE    not fasting    HPI:  Hannah Barron is a 46 y.o. female who presents today for a Medicare Annual Wellness/Physical exam.    1) Health Maintenance -   Diet - Averages about 2-3 meals per day consisting of a regular diet; Caffeine on occasion.   Exercise - Limited secondary to surgery  2) Preventative Exams / Immunizations:  Dental -- Due for exam   Vision -- Up to date   Health Maintenance  Topic Date Due  . INFLUENZA VACCINE  08/10/2016  . PAP SMEAR  04/02/2019  . TETANUS/TDAP  11/10/2024  . HIV Screening  Completed     Immunization History  Administered Date(s) Administered  . Influenza,inj,Quad PF,36+ Mos 11/07/2015  . Pneumococcal Polysaccharide-23 11/07/2015    RISK FACTORS  Tobacco History  Smoking Status  . Never Smoker  Smokeless Tobacco  . Never Used     Cardiac risk factors: none.  Depression Screen  Depression screen [facility] 2/9 04/21/2016  Decreased Interest 3  Down, Depressed, Hopeless 2  PHQ - 2 Score 5  Altered sleeping 0  Tired, decreased energy 2  Change in appetite 3  Feeling bad or failure about yourself  0  Trouble concentrating 0  Moving slowly or fidgety/restless 0  Suicidal thoughts 0  PHQ-9 Score 10     Activities of Daily Living In your present state of health, do you have any difficulty performing the following activities?:  Driving? No Managing money?  No Feeding yourself? No Getting from bed to chair? No Climbing a flight of stairs? No Preparing food and eating?: No Bathing or showering? No Getting dressed: No Getting to the toilet? No Using the toilet: No Moving around from place to place: No In the past year have you fallen or had a near fall?:No   Home Safety Has smoke detector and wears seat belts. No firearms. No excess sun exposure. Are there smokers in your home  (other than you)?  No Do you feel safe at home?  Yes  Hearing Difficulties: No Do you often ask people to speak up or repeat themselves? No Do you experience ringing or noises in your ears? No  Do you have difficulty understanding soft or whispered voices? No    Cognitive Testing  Alert? Yes   Normal Appearance? Yes  Oriented to person? Yes  Place? Yes   Time? Yes  Recall of three objects?  Yes  Can perform simple calculations? Yes  Displays appropriate judgment? Yes  Can read the correct time from a watch face? Yes  Do you feel that you have a problem with memory? No  Do you often misplace items? No   Advanced Directives have been discussed with the patient? Yes   Current Physicians/Providers and Suppliers  1. Terri Piedra, FNP - Internal Medicine 2. Mora Bellman, MD - Gynecology 3. Lavonia Drafts, MD - Gynecology 4. Marshell Garfinkel, MD - Pulmonology 5. Rexene Edison, NP - Pulmonology 6. Peter Martinique, MD - Cardiology  Indicate any recent Medical Services you may have received from other than [facility] providers in the past year (date may be approximate).  All answers were reviewed with the patient and necessary referrals were made:  Mauricio Po, [place]   04/28/2016    No Known Allergies   Outpatient Medications Prior to Visit  Medication Sig Dispense Refill  . albuterol (VENTOLIN HFA) 108 (90 Base) MCG/ACT inhaler Inhale 2 puffs into the lungs every 6 (six) hours as needed for wheezing or shortness of breath.    . budesonide-formoterol (SYMBICORT) 160-4.5 MCG/ACT inhaler Inhale 2 puffs into the lungs 2 (two) times daily. 1 Inhaler 4  . cetirizine (ZYRTEC) 10 MG tablet Take 1 tablet (10 mg total) by mouth daily as needed for allergies. 10 tablet 0  . docusate sodium (COLACE) 100 MG capsule Take 1 capsule (100 mg total) by mouth 2 (two) times daily. 10 capsule 0  . Fe Fum-FePoly-FA-Vit C-Vit B3 (INTEGRA F) 125-1 MG CAPS Take 1 capsule by mouth daily. 30 capsule 2  .  metoprolol succinate (TOPROL-XL) 50 MG 24 hr tablet Take 1 tablet (50 mg total) by mouth daily. Take with or immediately following a meal. 90 tablet 3  . Respiratory Therapy Supplies (FLUTTER) DEVI Use as directed. 1 each 0  . RESTASIS 0.05 % ophthalmic emulsion INSTILL 1 DROP IN EACH EYE TWICE DAILY.  2  . ibuprofen (ADVIL,MOTRIN) 600 MG tablet Take 1 tablet (600 mg total) by mouth every 6 (six) hours as needed for moderate pain. 30 tablet 1  . omeprazole (PRILOSEC) 40 MG capsule Take 40 mg by mouth daily.  5   No facility-administered medications prior to visit.      Past Medical History:  Diagnosis Date  . Anemia   . Bronchiectasis ([place])   . COPD (chronic obstructive pulmonary disease) ([place])   . GERD (gastroesophageal reflux disease)   . Migraines   . Paroxysmal SVT (supraventricular tachycardia) ([place])    a. diagnosed in 11/2015.  . Right leg numbness      Past Surgical History:  Procedure Laterality Date  . CESAREAN SECTION    . CYSTOSCOPY N/A 04/05/2016   Procedure: CYSTOSCOPY;  Surgeon: Lavonia Drafts, MD;  Location: [facility];  Service: Gynecology;  Laterality: N/A;  . LAPAROSCOPIC VAGINAL HYSTERECTOMY WITH SALPINGECTOMY Bilateral 04/05/2016   Procedure: LAPAROSCOPIC ASSISTED VAGINAL HYSTERECTOMY WITH SALPINGECTOMY;  Surgeon: Lavonia Drafts, MD;  Location: [facility];  Service: Gynecology;  Laterality: Bilateral;  . LUNG BIOPSY    . VIDEO BRONCHOSCOPY Bilateral 03/29/2016   Procedure: VIDEO BRONCHOSCOPY WITHOUT FLUORO;  Surgeon: Marshell Garfinkel, MD;  Location: [facility];  Service: Cardiopulmonary;  Laterality: Bilateral;     Family History  Problem Relation Age of Onset  . Healthy Mother   . Healthy Father      Social History   Social History  . Marital status: Married    Spouse name: N/A  . Number of children: 1  . Years of education: 35   Occupational History  . disability    Social History Main Topics  . Smoking status: Never Smoker  .  Smokeless tobacco: Never Used  . Alcohol use No  . Drug use: No  . Sexual activity: Yes   Other Topics Concern  . Not on file   Social History Narrative   Fun: Watch TV     Review of Systems  Constitutional: Denies fever, chills, fatigue, or significant weight gain/loss. HENT: Head: Denies headache or neck pain Ears: Denies changes in hearing, ringing in ears, earache, drainage Nose: Denies discharge, stuffiness, itching, nosebleed, sinus pain Throat: Denies sore throat, hoarseness, dry mouth, sores, thrush Eyes: Denies loss/changes in vision, pain, redness, blurry/double vision, flashing lights Cardiovascular: Denies chest pain/discomfort, tightness, palpitations, shortness of breath with activity, difficulty lying down, swelling, sudden awakening with shortness of breath Respiratory: Denies  shortness of breath, cough, sputum production, wheezing Gastrointestinal: Denies dysphasia, heartburn, change in appetite, nausea, change in bowel habits, rectal bleeding, constipation, diarrhea, yellow skin or eyes Genitourinary: Denies frequency, urgency, burning/pain, blood in urine, incontinence, change in urinary strength. Musculoskeletal: Denies muscle/joint pain, stiffness, back pain, redness or swelling of joints, trauma Skin: Denies rashes, lumps, itching, dryness, color changes, or hair/nail changes Neurological: Denies dizziness, fainting, seizures, weakness, numbness, tingling, tremor Psychiatric - Denies nervousness, stress, depression or memory loss Endocrine: Denies heat or cold intolerance, sweating, frequent urination, excessive thirst, changes in appetite Hematologic: Denies ease of bruising or bleeding    Objective:     BP 114/66 (BP Location: Right Arm, Patient Position: Sitting, Cuff Size: Large)   Pulse 83   Temp 98.3 F (36.8 C) (Oral)   Resp 16   Ht 5\' 7"  (1.702 m)   Wt 171 lb 1.9 oz (77.6 kg)   LMP 03/31/2016   SpO2 98%   BMI 26.80 kg/m  Nursing note and  vital signs reviewed.  Physical Exam  Constitutional: She is oriented to person, place, and time. She appears well-developed and well-nourished.  HENT:  Head: Normocephalic.  Right Ear: Hearing, tympanic membrane, external ear and ear canal normal.  Left Ear: Hearing, tympanic membrane, external ear and ear canal normal.  Nose: Nose normal.  Mouth/Throat: Uvula is midline, oropharynx is clear and moist and mucous membranes are normal.  Eyes: Conjunctivae and EOM are normal. Pupils are equal, round, and reactive to light.  Neck: Neck supple. No JVD present. No tracheal deviation present. No thyromegaly present.  Cardiovascular: Normal rate, regular rhythm, normal heart sounds and intact distal pulses.   Pulmonary/Chest: Effort normal and breath sounds normal.  Abdominal: Soft. Bowel sounds are normal. She exhibits no distension and no mass. There is no tenderness. There is no rebound and no guarding.  Musculoskeletal: Normal range of motion. She exhibits no edema or tenderness.  Lymphadenopathy:    She has no cervical adenopathy.  Neurological: She is alert and oriented to person, place, and time. She has normal reflexes. No cranial nerve deficit. She exhibits normal muscle tone. Coordination normal.  Skin: Skin is warm and dry.  Psychiatric: She has a normal mood and affect. Her behavior is normal. Judgment and thought content normal.       Assessment & Plan:   During the course of the visit the patient was educated and counseled about appropriate screening and preventive services including:    Pneumococcal vaccine   Influenza vaccine  Td vaccine  Nutrition counseling   Diet review for nutrition referral? Yes ____  Not Indicated _X___   Patient Instructions (the written plan) was given to the patient.  Medicare Attestation I have personally reviewed: The patient's medical and social history Their use of alcohol, tobacco or illicit drugs Their current medications and  supplements The patient's functional ability including ADLs,fall risks, home safety risks, cognitive, and hearing and visual impairment Diet and physical activities Evidence for depression or mood disorders  The patient's weight, height, BMI,  have been recorded in the chart.  I have made referrals, counseling, and provided education to the patient based on review of the above and I have provided the patient with a written personalized care plan for preventive services.     Problem List Items Addressed This Visit      Respiratory   COPD (chronic obstructive pulmonary disease) ([place])    Stable with no current exacerbation. Continue medications and follow per pulmonology.  Other   Pelvic pain in female   Relevant Medications   ibuprofen (ADVIL,MOTRIN) 600 MG tablet   Medicare annual wellness visit, subsequent - Primary    Reviewed and updated patient's medical, surgical, family and social history. Medications and allergies were also reviewed. Basic screenings for depression, activities of daily living, hearing, cognition and safety were performed. Provider list was updated and health plan was provided to the patient.       Routine adult health maintenance    1) Anticipatory Guidance: Discussed importance of wearing a seatbelt while driving and not texting while driving; changing batteries in smoke detector at least once annually; wearing suntan lotion when outside; eating a balanced and moderate diet; getting physical activity at least 30 minutes per day.  2) Immunizations / Screenings / Labs:  All immunizations are up to date per recommendations. Due for a dental screen encouraged to be completed independently. Cervical cancer screening is up to date. All other screenings are up to date per recommendations. Obtain CBC, CMET, and lipid profile.    Overall well exam with minimal risk factors for cardiovascular disease. BMI does indicate overweight. Goal is to increase physical  activity as she gets further post-surgery. Continue other healthy lifestyle behaviors and choices. Follow up prevention exam in 1 year. Follow up office visit pending blood work as needed.       Relevant Orders   CBC (Completed)   Comprehensive metabolic panel (Completed)   Lipid panel (Completed)       I have changed Ms. Barajas's omeprazole. I am also having her start on traZODone. Additionally, I am having her maintain her RESTASIS, cetirizine, budesonide-formoterol, metoprolol succinate, FLUTTER, albuterol, docusate sodium, INTEGRA F, and ibuprofen.   Meds ordered this encounter  Medications  . omeprazole (PRILOSEC) 40 MG capsule    Sig: Take 1 capsule (40 mg total) by mouth daily.    Dispense:  30 capsule    Refill:  2  . ibuprofen (ADVIL,MOTRIN) 600 MG tablet    Sig: Take 1 tablet (600 mg total) by mouth every 6 (six) hours as needed for moderate pain.    Dispense:  30 tablet    Refill:  1  . traZODone (DESYREL) 50 MG tablet    Sig: Take 0.5-1 tablets (25-50 mg total) by mouth at bedtime as needed for sleep.    Dispense:  30 tablet    Refill:  1    Order Specific Question:   Supervising Provider    Answer:   Pricilla Holm A [[id number]]     Follow-up: Return in about 6 months (around 10/28/2016), or if symptoms worsen or fail to improve.   Mauricio Po, FNP

## 2016-04-28 NOTE — Assessment & Plan Note (Signed)
Stable with no current exacerbation. Continue medications and follow per pulmonology.

## 2016-04-28 NOTE — Assessment & Plan Note (Signed)
1) Anticipatory Guidance: Discussed importance of wearing a seatbelt while driving and not texting while driving; changing batteries in smoke detector at least once annually; wearing suntan lotion when outside; eating a balanced and moderate diet; getting physical activity at least 30 minutes per day.  2) Immunizations / Screenings / Labs:  All immunizations are up to date per recommendations. Due for a dental screen encouraged to be completed independently. Cervical cancer screening is up to date. All other screenings are up to date per recommendations. Obtain CBC, CMET, and lipid profile.    Overall well exam with minimal risk factors for cardiovascular disease. BMI does indicate overweight. Goal is to increase physical activity as she gets further post-surgery. Continue other healthy lifestyle behaviors and choices. Follow up prevention exam in 1 year. Follow up office visit pending blood work as needed.

## 2016-05-02 NOTE — Progress Notes (Signed)
Spoke with Lesleigh Noe last week, nothing further needed at this time

## 2016-05-06 DIAGNOSIS — J309 Allergic rhinitis, unspecified: Secondary | ICD-10-CM | POA: Diagnosis not present

## 2016-05-06 DIAGNOSIS — Z6826 Body mass index (BMI) 26.0-26.9, adult: Secondary | ICD-10-CM | POA: Diagnosis not present

## 2016-05-06 DIAGNOSIS — Z90710 Acquired absence of both cervix and uterus: Secondary | ICD-10-CM | POA: Diagnosis not present

## 2016-05-06 DIAGNOSIS — Z9181 History of falling: Secondary | ICD-10-CM | POA: Diagnosis not present

## 2016-05-06 DIAGNOSIS — R000 Tachycardia, unspecified: Secondary | ICD-10-CM | POA: Diagnosis not present

## 2016-05-06 DIAGNOSIS — R262 Difficulty in walking, not elsewhere classified: Secondary | ICD-10-CM | POA: Diagnosis not present

## 2016-05-06 DIAGNOSIS — D509 Iron deficiency anemia, unspecified: Secondary | ICD-10-CM | POA: Diagnosis not present

## 2016-05-06 DIAGNOSIS — G8918 Other acute postprocedural pain: Secondary | ICD-10-CM | POA: Diagnosis not present

## 2016-05-06 DIAGNOSIS — J449 Chronic obstructive pulmonary disease, unspecified: Secondary | ICD-10-CM | POA: Diagnosis not present

## 2016-05-06 DIAGNOSIS — Z0000 Encounter for general adult medical examination without abnormal findings: Secondary | ICD-10-CM | POA: Diagnosis not present

## 2016-05-06 DIAGNOSIS — K219 Gastro-esophageal reflux disease without esophagitis: Secondary | ICD-10-CM | POA: Diagnosis not present

## 2016-05-06 DIAGNOSIS — G4700 Insomnia, unspecified: Secondary | ICD-10-CM | POA: Diagnosis not present

## 2016-05-12 LAB — ACID FAST CULTURE WITH REFLEXED SENSITIVITIES (MYCOBACTERIA)

## 2016-05-12 LAB — ACID FAST CULTURE WITH REFLEXED SENSITIVITIES: ACID FAST CULTURE - AFSCU3: NEGATIVE

## 2016-05-16 ENCOUNTER — Ambulatory Visit (INDEPENDENT_AMBULATORY_CARE_PROVIDER_SITE_OTHER): Payer: Self-pay | Admitting: Obstetrics & Gynecology

## 2016-05-16 VITALS — BP 111/64 | HR 84 | Wt 171.0 lb

## 2016-05-16 DIAGNOSIS — Z9889 Other specified postprocedural states: Secondary | ICD-10-CM

## 2016-05-16 NOTE — Patient Instructions (Signed)
Laparoscopically Assisted Vaginal Hysterectomy, Care After °Refer to this sheet in the next few weeks. These instructions provide you with information on caring for yourself after your procedure. Your health care provider may also give you more specific instructions. Your treatment has been planned according to current medical practices, but problems sometimes occur. Call your health care provider if you have any problems or questions after your procedure. °What can I expect after the procedure? °After your procedure, it is typical to have the following: °· Abdominal pain. You will be given pain medicine to control it. °· Sore throat from the breathing tube that was inserted during surgery. ° °Follow these instructions at home: °· Only take over-the-counter or prescription medicines for pain, discomfort, or fever as directed by your health care provider. °· Do not take aspirin. It can cause bleeding. °· Do not drive when taking pain medicine. °· Follow your health care provider's advice regarding diet, exercise, lifting, driving, and general activities. °· Resume your usual diet as directed and allowed. °· Get plenty of rest and sleep. °· Do not douche, use tampons, or have sexual intercourse for at least 6 weeks, or until your health care provider gives you permission. °· Change your bandages (dressings) as directed by your health care provider. °· Monitor your temperature and notify your health care provider of a fever. °· Take showers instead of baths for 2-3 weeks. °· Do not drink alcohol until your health care provider gives you permission. °· If you develop constipation, you may take a mild laxative with your health care provider's permission. Bran foods may help with constipation problems. Drinking enough fluids to keep your urine clear or pale yellow may help as well. °· Try to have someone home with you for 1-2 weeks to help around the house. °· Keep all of your follow-up appointments as directed by your  health care provider. °Contact a health care provider if: °· You have swelling, redness, or increasing pain around your incision sites. °· You have pus coming from your incision. °· You notice a bad smell coming from your incision. °· Your incision breaks open. °· You feel dizzy or lightheaded. °· You have pain or bleeding when you urinate. °· You have persistent diarrhea. °· You have persistent nausea and vomiting. °· You have abnormal vaginal discharge. °· You have a rash. °· You have any type of abnormal reaction or develop an allergy to your medicine. °· You have poor pain control with your prescribed medicine. °Get help right away if: °· You have a fever. °· You have severe abdominal pain. °· You have chest pain. °· You have shortness of breath. °· You faint. °· You have pain, swelling, or redness in your leg. °· You have heavy vaginal bleeding with blood clots. °This information is not intended to replace advice given to you by your health care provider. Make sure you discuss any questions you have with your health care provider. °Document Released: 12/16/2010 Document Revised: 06/04/2015 Document Reviewed: 07/12/2012 °Elsevier Interactive Patient Education © 2017 [employer] ° °

## 2016-05-16 NOTE — Progress Notes (Signed)
History:  46 y.o. G1P1001 here today for LAVH with bilateral salpingectomy 04/05/2016.  She reports that she is doing very well. She denies pain. She is voiding and passing stools without difficulty.  She reports that she is very happy that she had the surgery.     The following portions of the patient's history were reviewed and updated as appropriate: allergies, current medications, past family history, past medical history, past social history, past surgical history and problem list.  Review of Systems:  Pertinent items are noted in HPI.   Objective:  Physical Exam Blood pressure 111/64, pulse 84, weight 171 lb (77.6 kg), last menstrual period 03/31/2016. Gen: NAD Abd: Soft, nontender and nondistended. Port sites well healed. Pelvic: Normal appearing external genitalia; normal appearing vaginal mucosa. Vaginal cuff well healed.  Normal discharge. No adnexal tenderness  Labs and Imaging 04/05/2016 Diagnosis Uterus, cervix and bilateral fallopian tubes, with fibroids CERVIX: CHRONIC CERVICITIS ENDOMETRIUM: PROLIFERATIVE ENDOMETRIUM MYOMETRIUM: LEIOMYOMAS BILATERAL FALLOPIAN TUBES: HISTOLOGICAL UNREMARKABLE  Assessment & Plan:  6 week post op check pt is doing very well. All questions answered.  Gradual return to full activities f/u in 3 months or sooner prn  Riot Waterworth L. Harraway-Smith, M.D., [place]lynn June

## 2016-05-17 ENCOUNTER — Encounter: Payer: Self-pay | Admitting: Obstetrics & Gynecology

## 2016-05-30 ENCOUNTER — Encounter: Payer: Self-pay | Admitting: Pulmonary Disease

## 2016-05-30 ENCOUNTER — Ambulatory Visit (INDEPENDENT_AMBULATORY_CARE_PROVIDER_SITE_OTHER): Payer: Medicare HMO | Admitting: Pulmonary Disease

## 2016-05-30 VITALS — BP 112/64 | HR 84 | Ht 67.0 in | Wt 171.0 lb

## 2016-05-30 DIAGNOSIS — J479 Bronchiectasis, uncomplicated: Secondary | ICD-10-CM | POA: Diagnosis not present

## 2016-05-30 NOTE — Patient Instructions (Addendum)
You have a diagnosis of bronchiectasis Continue using the symbicort and albuterol Continue using the flutter valve  Follow up in 6 months

## 2016-05-30 NOTE — Progress Notes (Signed)
Hannah Barron    [id number]    1970/09/07  Primary Care Physician:Calone, Ples Specter, FNP  Referring Physician: [address], [place], [place] [address]  Chief complaint:  Follow up for  Cystic bronchiectasis  HPI: 46 year old with chronic obstructive lung disease, bronchiectasis. She was previously followed at [place] with a VATS biopsy in 2003 and was told she had cystic bronchiectasis. She had followed up with a pulmonologist at [facility] but has moved to [place] on 2017. We have been unable to get records from [place] in spite of multiple attempts.  She reports intermittent symptoms of cough with mucus production. She denies any fevers, chills. She continues on her inhalers including Symbicort and albuterol rescue inhaler. She is an immigrant from [place] in 1998 and does not report any exposure to tuberculosis. She does not recall getting a BCG vaccine as a child.  Interim history: She underwent a bronchoscopy to evaluate for MAI infection and a positive quantiferon as she was unable to give a good sputum sample. All results are negative except for positive PJP on DFA. She underwent further evaluation with negative HIV and beta glucan tests.  She had a hysterectomy for uterine fibroids. She is doing well post op and reports improvement in her cough, sputum production, dyspnea.  Outpatient Encounter Prescriptions as of 05/30/2016  Medication Sig  . albuterol (VENTOLIN HFA) 108 (90 Base) MCG/ACT inhaler Inhale 2 puffs into the lungs every 6 (six) hours as needed for wheezing or shortness of breath.  . budesonide-formoterol (SYMBICORT) 160-4.5 MCG/ACT inhaler Inhale 2 puffs into the lungs 2 (two) times daily.  Marland Kitchen docusate sodium (COLACE) 100 MG capsule Take 1 capsule (100 mg total) by mouth 2 (two) times daily.  . Fe Fum-FePoly-FA-Vit C-Vit B3 (INTEGRA F) 125-1 MG CAPS Take 1 capsule by mouth daily.  Marland Kitchen ibuprofen (ADVIL,MOTRIN) 600 MG tablet Take 1  tablet (600 mg total) by mouth every 6 (six) hours as needed for moderate pain.  . metoprolol succinate (TOPROL-XL) 50 MG 24 hr tablet Take 1 tablet (50 mg total) by mouth daily. Take with or immediately following a meal.  . omeprazole (PRILOSEC) 40 MG capsule Take 1 capsule (40 mg total) by mouth daily.  Marland Kitchen Respiratory Therapy Supplies (FLUTTER) DEVI Use as directed.  . RESTASIS 0.05 % ophthalmic emulsion INSTILL 1 DROP IN EACH EYE TWICE DAILY.  . traZODone (DESYREL) 50 MG tablet Take 0.5-1 tablets (25-50 mg total) by mouth at bedtime as needed for sleep.  . [DISCONTINUED] cetirizine (ZYRTEC) 10 MG tablet Take 1 tablet (10 mg total) by mouth daily as needed for allergies.   No facility-administered encounter medications on file as of 05/30/2016.     Allergies as of 05/30/2016  . (No Known Allergies)    Past Medical History:  Diagnosis Date  . Anemia   . Bronchiectasis ([place])   . COPD (chronic obstructive pulmonary disease) ([place])   . GERD (gastroesophageal reflux disease)   . Migraines   . Paroxysmal SVT (supraventricular tachycardia) ([place])    a. diagnosed in 11/2015.  . Right leg numbness     Past Surgical History:  Procedure Laterality Date  . CESAREAN SECTION    . CYSTOSCOPY N/A 04/05/2016   Procedure: CYSTOSCOPY;  Surgeon: Lavonia Drafts, MD;  Location: [facility];  Service: Gynecology;  Laterality: N/A;  . LAPAROSCOPIC VAGINAL HYSTERECTOMY WITH SALPINGECTOMY Bilateral 04/05/2016   Procedure: LAPAROSCOPIC ASSISTED VAGINAL HYSTERECTOMY WITH SALPINGECTOMY;  Surgeon:  Lavonia Drafts, MD;  Location: [facility];  Service: Gynecology;  Laterality: Bilateral;  . LUNG BIOPSY    . VIDEO BRONCHOSCOPY Bilateral 03/29/2016   Procedure: VIDEO BRONCHOSCOPY WITHOUT FLUORO;  Surgeon: Marshell Garfinkel, MD;  Location: [facility];  Service: Cardiopulmonary;  Laterality: Bilateral;    Family History  Problem Relation Age of Onset  . Healthy Mother   . Healthy Father     Social History     Social History  . Marital status: Married    Spouse name: N/A  . Number of children: 1  . Years of education: 64   Occupational History  . disability    Social History Main Topics  . Smoking status: Never Smoker  . Smokeless tobacco: Never Used  . Alcohol use No  . Drug use: No  . Sexual activity: Yes   Other Topics Concern  . Not on file   Social History Narrative   Fun: Watch TV    Review of systems: Review of Systems  Constitutional: Negative for fever and chills.  HENT: Negative.   Eyes: Negative for blurred vision.  Respiratory: as per HPI  Cardiovascular: Negative for chest pain and palpitations.  Gastrointestinal: Negative for vomiting, diarrhea, blood per rectum. Genitourinary: Negative for dysuria, urgency, frequency and hematuria.  Musculoskeletal: Negative for myalgias, back pain and joint pain.  Skin: Negative for itching and rash.  Neurological: Negative for dizziness, tremors, focal weakness, seizures and loss of consciousness.  Endo/Heme/Allergies: Negative for environmental allergies.  Psychiatric/Behavioral: Negative for depression, suicidal ideas and hallucinations.  All other systems reviewed and are negative.  Physical Exam: Blood pressure 116/70, pulse (!) 106, height 5\' 7"  (1.702 m), weight 172 lb 12.8 oz (78.4 kg), SpO2 98 %. Gen:      No acute distress HEENT:  EOMI, sclera anicteric Neck:     No masses; no thyromegaly Lungs:    Clear to auscultation bilaterally; normal respiratory effort CV:         Regular rate and rhythm; no murmurs Abd:      + bowel sounds; soft, non-tender; no palpable masses, no distension Ext:    No edema; adequate peripheral perfusion Skin:      Warm and dry; no rash Neuro: alert and oriented x 3 Psych: normal mood and affect  Data Reviewed: A1AT 03/16/16- 161, PIMM Quantiferon 03/16/16- Positive CF panel 03/25/15- negative for 97 mutations analyzed (report scanned)  Labs 03/24/16 IgG 1477 IgA 134 IgM 168 IgE  6 ANA, CCP, RA- negative  HIV 04/01/16- Negative Beta D glucan 04/01/16- Negative  CT high resolution 07/16/15-areas of cystic and varicose bronchiectasis, extensive air trapping, multiple pulmonary nodules. CT chest 03/21/16-areas of bronchiectasis, at trapping and pulmonary nodules. Unchanged compared to 2017 CXR 03/29/16- mild hazy perihilar opacities likely secondary to large, no pneumothorax. I have reviewed all images personally.  Myocardial perfusion study 12/2015 >neg  Ischemia , EF 55% Echo 10/2015 >EF 65%.   PFT 06/2015  FVC 2.45 (70%) FEV1 1.54 [56%) F/F 63 TLC 78% Moderate obstructive airway disease, minimal restriction. DLCO could not be completed  03/16/16 FVC 2.29 (69%) FEV1 1.46 (54%) F/F 64  Bronchoscopy 03/29/16 Cultures, AFB, fungal cultures-negative to date PJP DFA-positive Cytology-no malignant cells, CD4: CD8 ratio-1.62 Cell count WBC-26, 26% lymphs, 50% neutrophils, 24% monocyte macrophage  Assessment:  Cystic bronchiectasis She's had workup which is negative for alpha-1 antitrypsin, immunoglobulin deficiency, autoimmune disease, cystic fibrosis and mycobacterial infections. She is doing well on Symbicort and flutter valve. We discussed starting her on  smart vest that since a flutter valve is doing well we with continue this for now.  Positive PJP DFA This is likely a false positive assessment evidence of immunodeficiency, HIV is negative. Besides her CT scan is not characteristic of pneumocystis infection and beta D glucan is negative. We will continue to observe for now.  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 Will discuss INH prophylaxis at next visit.   Plan/Recommendations: - Continue symbicort, albuterol - Flutter valve.   Marshell Garfinkel MD [facility] Pager [phone] 05/30/2016, 12:50 PM  CC: [facility], FNP

## 2016-06-17 NOTE — Addendum Note (Signed)
Addendum  created 06/17/16 1127 by Lyn Hollingshead, MD   Sign clinical note

## 2016-06-25 ENCOUNTER — Other Ambulatory Visit: Payer: Self-pay | Admitting: Family

## 2016-07-30 NOTE — Telephone Encounter (Signed)
Opened in error

## 2016-08-23 ENCOUNTER — Ambulatory Visit (INDEPENDENT_AMBULATORY_CARE_PROVIDER_SITE_OTHER): Payer: Medicare HMO | Admitting: Obstetrics & Gynecology

## 2016-08-23 ENCOUNTER — Encounter: Payer: Self-pay | Admitting: Obstetrics & Gynecology

## 2016-08-23 VITALS — BP 123/70 | HR 91 | Ht 67.0 in | Wt 171.7 lb

## 2016-08-23 DIAGNOSIS — R102 Pelvic and perineal pain: Secondary | ICD-10-CM

## 2016-08-23 NOTE — Patient Instructions (Signed)
Fiber Content in Foods See the following list for the dietary fiber content of some common foods. High-fiber foods High-fiber foods contain 4 grams or more (4g or more) of fiber per serving. They include:  Artichoke (fresh) - 1 medium has 10.3g of fiber.  Baked beans, plain or vegetarian (canned) -  cup has 5.2g of fiber.  Blackberries or raspberries (fresh) -  cup has 4g of fiber.  Bran cereal -  cup has 8.6g of fiber.  Bulgur (cooked) -  cup has 4g of fiber.  Kidney beans (canned) -  cup has 6.8g of fiber.  Lentils (cooked) -  cup has 7.8g of fiber.  Pear (fresh) - 1 medium has 5.1g of fiber.  Peas (frozen) -  cup has 4.4g of fiber.  Pinto beans (canned) -  cup has 5.5g of fiber.  Pinto beans (dried and cooked) -  cup has 7.7g of fiber.  Potato with skin (baked) - 1 medium has 4.4g of fiber.  Quinoa (cooked) -  cup has 5g of fiber.  Soybeans (canned, frozen, or fresh) -  cup has 5.1g of fiber.  Moderate-fiber foods Moderate-fiber foods contain 1-4 grams (1-4g) of fiber per serving. They include:  Almonds - 1 oz. has 3.5g of fiber.  Apple with skin - 1 medium has 3.3g of fiber.  Applesauce, sweetened -  cup has 1.5g of fiber.  Bagel, plain - one 4-inch (10-cm) bagel has 2g of fiber.  Banana - 1 medium has 3.1g of fiber.  Broccoli (cooked) -  cup has 2.5g of fiber.  Carrots (cooked) -  cup has 2.3g of fiber.  Corn (canned or frozen) -  cup has 2.1g of fiber.  Corn tortilla - one 6-inch (15-cm) tortilla has 1.5g of fiber.  Green beans (canned) -  cup has 2g of fiber.  Instant oatmeal -  cup has about 2g of fiber.  Long-grain brown rice (cooked) - 1 cup has 3.5g of fiber.  Macaroni, enriched (cooked) - 1 cup has 2.5g of fiber.  Melon - 1 cup has 1.4g of fiber.  Multigrain cereal -  cup has about 2-4g of fiber.  Orange - 1 small has 3.1g of fiber.  Potatoes, mashed -  cup has 1.6g of fiber.  Raisins - 1/4 cup has 1.6g of  fiber.  Squash -  cup has 2.9g of fiber.  Sunflower seeds -  cup has 1.1g of fiber.  Tomato - 1 medium has 1.5g of fiber.  Vegetable or soy patty - 1 has 3.4g of fiber.  Whole-wheat bread - 1 slice has 2g of fiber.  Whole-wheat spaghetti -  cup has 3.2g of fiber.  Low-fiber foods Low-fiber foods contain less than 1 gram (less than 1g) of fiber per serving. They include:  Egg - 1 large.  Flour tortilla - one 6-inch (15-cm) tortilla.  Fruit juice -  cup.  Lettuce - 1 cup.  Meat, poultry, or fish - 1 oz.  Milk - 1 cup.  Spinach (raw) - 1 cup.  White bread - 1 slice.  White rice -  cup.  Yogurt -  cup.  Actual amounts of fiber in foods may be different depending on processing. Talk with your dietitian about how much fiber you need in your diet. This information is not intended to replace advice given to you by your health care provider. Make sure you discuss any questions you have with your health care provider. Document Released: 05/15/2006 Document Revised: 06/04/2015 Document Reviewed: 02/19/2015 Elsevier Interactive Patient   Education  2018 Elsevier Inc.  

## 2016-08-23 NOTE — Progress Notes (Signed)
History:  46 y.o. G1P1001 here today for 5 month eval after LAVH wth bilateral salpingectomy 03/2016. Pt reports oc mild pain in lower mid abd. She reports BM daily. She reports that this does NOT interfere with her daily activities.  She is not taking meds for this pain.   She does reports lots of gas and bloating. She does not eat many frits or veggies.   The following portions of the patient's history were reviewed and updated as appropriate: allergies, current medications, past family history, past medical history, past social history, past surgical history and problem list.  Review of Systems:  Pertinent items are noted in HPI.   Objective:  Physical Exam Last menstrual period 03/31/2016.  BP 123/70   Pulse 91   Ht 5\' 7"  (1.702 m)   Wt 171 lb 11.2 oz (77.9 kg)   LMP 03/31/2016   BMI 26.89 kg/m   CONSTITUTIONAL: Well-developed, well-nourished female in no acute distress.  HENT:  Normocephalic, atraumatic EYES: Conjunctivae and EOM are normal. No scleral icterus.  NECK: Normal range of motion SKIN: Skin is warm and dry. No rash noted. Not diaphoretic.No pallor. Thorp: Alert and oriented to person, place, and time. Normal coordination.  Abd: Soft, nontender and nondistended. Pelvic: Normal appearing external genitalia; normal appearing vaginal mucosa. No masses or adnexal tenderness  Labs and Imaging No results found.  Assessment & Plan:  Pelvic pain after hyst- Given the assoc GI sx rec increased fiber in her diet.  Sx possibly due to cont healing. Could also be due to adhesions.  Rec increased fiber in her diet. Reviewed how to check food labels for fiber. Reviewed food with increased fiber  Rec NSAIDS prn  Pt will f/u with primary care if sx persist  F/u 1 year or sooner prn  Total face-to-face time with patient was 15 min.  Greater than 50% was spent in counseling and coordination of care with the patient.    Liandro Thelin L. Harraway-Smith, M.D., Cherlynn June

## 2016-08-24 ENCOUNTER — Encounter: Payer: Self-pay | Admitting: Physician Assistant

## 2016-08-24 ENCOUNTER — Ambulatory Visit (INDEPENDENT_AMBULATORY_CARE_PROVIDER_SITE_OTHER): Payer: Medicare HMO | Admitting: Physician Assistant

## 2016-08-24 VITALS — BP 113/78 | HR 96 | Ht 67.0 in | Wt 172.6 lb

## 2016-08-24 DIAGNOSIS — R002 Palpitations: Secondary | ICD-10-CM

## 2016-08-24 DIAGNOSIS — R531 Weakness: Secondary | ICD-10-CM

## 2016-08-24 DIAGNOSIS — R079 Chest pain, unspecified: Secondary | ICD-10-CM | POA: Diagnosis not present

## 2016-08-24 DIAGNOSIS — G43101 Migraine with aura, not intractable, with status migrainosus: Secondary | ICD-10-CM | POA: Diagnosis not present

## 2016-08-24 MED ORDER — METOPROLOL SUCCINATE ER 50 MG PO TB24: ORAL_TABLET | ORAL | 1 refills | Status: DC

## 2016-08-24 NOTE — Patient Instructions (Signed)
Medication Instructions:   CHANGE the way you are taking the  Following medications:   Metoprolol  CONTINUE the current dose of 50mg  in the morning.  ADD 25mg  in the evening (1/2 tablet)  Labwork:   CBC, ANA, CMET today   Testing/Procedures:  none  Follow-Up:  With Dr. Martinique or with Isaac Laud in 6 weeks on a day Dr. Martinique is in the office.    If you need a refill on your cardiac medications before your next appointment, please call your pharmacy.

## 2016-08-24 NOTE — Progress Notes (Signed)
Cardiology Office Note    Date:  08/26/2016   ID:  Hannah Barron, DOB April 12, 1970, MRN [id number]  PCP:  Golden Circle, FNP  Cardiologist:  Dr. Martinique   Chief Complaint  Patient presents with  . Follow-up    pt c/o no energy    History of Present Illness:  Hannah Barron is a 46 y.o. female with PMH of bronchiectasis, migraine headache, COPD, GERD and paroxysmal SVT. She was admitted in October 2017 for evaluation of palpitations and chest pain. Serial troponin was mildly elevated, EKG showed no acute ST-T wave changes. Telemetry showed episodes of sinus tachycardia and this was thought to concur with her symptom. Echocardiogram showed EF 65-70% with no regional wall motion abnormality. She had a 30 day event monitor in November 2017 for evaluation of tachyarrhythmia. Later prevent this called on 12/10/2015 with a episode of SVT with heart rate of 171. She had symptom at the time with palpitation and dizziness. She was started on metoprolol. Exercise treadmill stress test performed on 12/02/2015 showed 1-2 mm ST depression in the anterior and lateral leads, with this being abnormal, Lexiscan Myoview was recommended to further assess ischemic etiology. Myoview obtained on 12/18/2015 showed normal perfusion was less than 1 mm ST depression, overall low risk. She did undergo to cause bradycardia earlier this year to evaluate MAI infection and positive quantiferon, all results negative except for positive PGP, this was followed by negative HIV.  She is accompanied by her husband today. She has been feeling fatigue for the past several months. She denies any obvious chest pain, but she mentions sometimes she'll wake up in the morning feeling her heart racing. I plan to add a 25 mg nighttime dose of metoprolol. I will also obtain a CBC, CMP and ANA. Otherwise, she does not have any significant lower extremity edema, orthopnea or PND. There is no sign of heart failure on physical exam.   Past Medical  History:  Diagnosis Date  . Anemia   . Bronchiectasis ([place])   . COPD (chronic obstructive pulmonary disease) ([place])   . GERD (gastroesophageal reflux disease)   . Migraines   . Paroxysmal SVT (supraventricular tachycardia) ([place])    a. diagnosed in 11/2015.  . Right leg numbness     Past Surgical History:  Procedure Laterality Date  . CESAREAN SECTION    . CYSTOSCOPY N/A 04/05/2016   Procedure: CYSTOSCOPY;  Surgeon: Lavonia Drafts, MD;  Location: [facility];  Service: Gynecology;  Laterality: N/A;  . LAPAROSCOPIC VAGINAL HYSTERECTOMY WITH SALPINGECTOMY Bilateral 04/05/2016   Procedure: LAPAROSCOPIC ASSISTED VAGINAL HYSTERECTOMY WITH SALPINGECTOMY;  Surgeon: Lavonia Drafts, MD;  Location: [facility];  Service: Gynecology;  Laterality: Bilateral;  . LUNG BIOPSY    . VIDEO BRONCHOSCOPY Bilateral 03/29/2016   Procedure: VIDEO BRONCHOSCOPY WITHOUT FLUORO;  Surgeon: Marshell Garfinkel, MD;  [facility];  Service: Cardiopulmonary;  Laterality: Bilateral;    Current Medications: Outpatient Medications Prior to Visit  Medication Sig Dispense Refill  . albuterol (VENTOLIN HFA) 108 (90 Base) MCG/ACT inhaler Inhale 2 puffs into the lungs every 6 (six) hours as needed for wheezing or shortness of breath.    . budesonide-formoterol (SYMBICORT) 160-4.5 MCG/ACT inhaler Inhale 2 puffs into the lungs 2 (two) times daily. 1 Inhaler 4  . Fe Fum-FePoly-FA-Vit C-Vit B3 (INTEGRA F) 125-1 MG CAPS Take 1 capsule by mouth daily. 30 capsule 2  . ibuprofen (ADVIL,MOTRIN) 600 MG tablet Take 1 tablet (600 mg total) by mouth every 6 (six) hours as needed  for moderate pain. 30 tablet 1  . omeprazole (PRILOSEC) 40 MG capsule Take 1 capsule (40 mg total) by mouth daily. 30 capsule 2  . Respiratory Therapy Supplies (FLUTTER) DEVI Use as directed. 1 each 0  . RESTASIS 0.05 % ophthalmic emulsion INSTILL 1 DROP IN EACH EYE TWICE DAILY.  2  . docusate sodium (COLACE) 100 MG capsule Take 1 capsule (100 mg total)  by mouth 2 (two) times daily. 10 capsule 0  . metoprolol succinate (TOPROL-XL) 50 MG 24 hr tablet Take 1 tablet (50 mg total) by mouth daily. Take with or immediately following a meal. 90 tablet 3  . traZODone (DESYREL) 50 MG tablet TAKE 1/2 TO 1 TABLET BY MOUTH AT BEDTIME AS NEEDED FOR SLEEP 30 tablet 1   No facility-administered medications prior to visit.      Allergies:   Patient has no known allergies.   Social History   Social History  . Marital status: Married    Spouse name: N/A  . Number of children: 1  . Years of education: 33   Occupational History  . disability    Social History Main Topics  . Smoking status: Never Smoker  . Smokeless tobacco: Never Used  . Alcohol use No  . Drug use: No  . Sexual activity: Yes   Other Topics Concern  . None   Social History Narrative   Fun: Watch TV     Family History:  The patient's family history includes Healthy in her father and mother.   ROS:   Please see the history of present illness.    ROS All other systems reviewed and are negative.   PHYSICAL EXAM:   VS:  BP 113/78   Pulse 96   Ht '5\' 7"'$  (1.702 m)   Wt 172 lb 9.6 oz (78.3 kg)   LMP 03/31/2016   BMI 27.03 kg/m    GEN: Well nourished, well developed, in no acute distress  HEENT: normal  Neck: no JVD, carotid bruits, or masses Cardiac: RRR; no murmurs, rubs, or gallops,no edema  Respiratory:  clear to auscultation bilaterally, normal work of breathing GI: soft, nontender, nondistended, + BS MS: no deformity or atrophy  Skin: warm and dry, no rash Neuro:  Alert and Oriented x 3, Strength and sensation are intact Psych: euthymic mood, full affect  Wt Readings from Last 3 Encounters:  08/24/16 172 lb 9.6 oz (78.3 kg)  08/23/16 171 lb 11.2 oz (77.9 kg)  05/30/16 171 lb (77.6 kg)      Studies/Labs Reviewed:   EKG:  EKG is ordered today.  The ekg ordered today demonstrates Mild sinus tachycardia, heart rate 96.  Recent Labs: 01/15/2016: TSH  1.40 08/24/2016: ALT 15; BUN 10; Creatinine, Ser 0.67; Hemoglobin 12.8; Platelets 325; Potassium 4.3; Sodium 140   Lipid Panel    Component Value Date/Time   CHOL 194 04/28/2016 1614   TRIG 108.0 04/28/2016 1614   HDL 62.00 04/28/2016 1614   CHOLHDL 3 04/28/2016 1614   VLDL 21.6 04/28/2016 1614   LDLCALC 111 (H) 04/28/2016 1614    Additional studies/ records that were reviewed today include:    Echo 11/07/2015 LV EF: 65% -   70%  Study Conclusions  - Left ventricle: The cavity size was normal. There was mild focal   basal hypertrophy of the septum. Systolic function was vigorous.   The estimated ejection fraction was in the range of 65% to 70%.   Wall motion was normal; there were no regional wall  motion   abnormalities. Left ventricular diastolic function parameters   were normal. - Aortic valve: Trileaflet; normal thickness, mildly calcified   leaflets.   Myoview 12/18/2015 Study Highlights    The left ventricular ejection fraction is normal (55-65%).  Nuclear stress EF: 64%.  The myocardial perufusion images are normal.  There is borderline transient ischemic dilatation with a TID of 1.19. Consider further evaluation with coronary CTA if clinically indicated.  This is an intermediate risk study due to borderline TID.  Blood pressure demonstrated a normal response to exercise.  There was 84m of J point depression with upsloping ST segment depression in the inferolateral leads      ASSESSMENT:    1. Weakness   2. Palpitation   3. Migraine with aura and with status migrainosus, not intractable      PLAN:  In order of problems listed above:  1. Weakness: Unclear cause for her weakness, I recommended 6 initial lab work such as CBC, CMP and ANA to further assess. She has a myriad of complaints, exact cause for her weakness is unclear. Given reassuring echocardiogram and Myoview in late 2017, I would not recommend any further workup at this  time.  2. Palpitation: Low risk myoview in November 2017. She continued to have palpitation many in the morning when she first woke up, we will add additional 25 mg nighttime dose of metoprolol.  3. Migraine: She described headache with aura, sounds like migraine, there is a diagnosis of migraine in her chart, I asked her to discuss this with her primary care physician.   Medication Adjustments/Labs and Tests Ordered: Current medicines are reviewed at length with the patient today.  Concerns regarding medicines are outlined above.  Medication changes, Labs and Tests ordered today are listed in the Patient Instructions below. Patient Instructions  Medication Instructions:   CHANGE the way you are taking the  Following medications:   Metoprolol  CONTINUE the current dose of '50mg'$  in the morning.  ADD '25mg'$  in the evening (1/2 tablet)  Labwork:   CBC, ANA, CMET today   Testing/Procedures:  none  Follow-Up:  With Dr. JMartiniqueor with HIsaac Laudin 6 weeks on a day Dr. JMartiniqueis in the office.    If you need a refill on your cardiac medications before your next appointment, please call your pharmacy.      SHilbert Corrigan PUtah 08/26/2016 2:29 PM    [facility] [address] [place]  [id number]: [phone] Fax: [phone]

## 2016-08-25 ENCOUNTER — Encounter: Payer: Self-pay | Admitting: Obstetrics & Gynecology

## 2016-08-25 LAB — COMPREHENSIVE METABOLIC PANEL
ALBUMIN: 4.3 g/dL (ref 3.5–5.5)
ALK PHOS: 81 IU/L (ref 39–117)
ALT: 15 IU/L (ref 0–32)
AST: 19 IU/L (ref 0–40)
Albumin/Globulin Ratio: 1.3 (ref 1.2–2.2)
BUN/Creatinine Ratio: 15 (ref 9–23)
BUN: 10 mg/dL (ref 6–24)
CHLORIDE: 102 mmol/L (ref 96–106)
CO2: 20 mmol/L (ref 20–29)
Calcium: 9.7 mg/dL (ref 8.7–10.2)
Creatinine, Ser: 0.67 mg/dL (ref 0.57–1.00)
GFR calc Af Amer: 122 mL/min/{1.73_m2} (ref >59)
GFR calc non Af Amer: 106 mL/min/{1.73_m2} (ref >59)
GLUCOSE: 88 mg/dL (ref 65–99)
Globulin, Total: 3.4 g/dL (ref 1.5–4.5)
Potassium: 4.3 mmol/L (ref 3.5–5.2)
SODIUM: 140 mmol/L (ref 134–144)
Total Protein: 7.7 g/dL (ref 6.0–8.5)

## 2016-08-25 LAB — CBC
HEMOGLOBIN: 12.8 g/dL (ref 11.1–15.9)
Hematocrit: 38.2 % (ref 34.0–46.6)
MCH: 30.3 pg (ref 26.6–33.0)
MCHC: 33.5 g/dL (ref 31.5–35.7)
MCV: 91 fL (ref 79–97)
PLATELETS: 325 10*3/uL (ref 150–379)
RBC: 4.22 x10E6/uL (ref 3.77–5.28)
RDW: 14.2 % (ref 12.3–15.4)
WBC: 6.3 10*3/uL (ref 3.4–10.8)

## 2016-08-25 LAB — ANA W/REFLEX IF POSITIVE: ANA: NEGATIVE

## 2016-08-25 NOTE — Progress Notes (Signed)
ANA negative as well.

## 2016-08-26 ENCOUNTER — Encounter: Payer: Self-pay | Admitting: Physician Assistant

## 2016-08-30 NOTE — Addendum Note (Signed)
Addended by: Theodore Demark on: 08/30/2016 05:42 PM   Modules accepted: Orders

## 2016-09-08 ENCOUNTER — Ambulatory Visit (INDEPENDENT_AMBULATORY_CARE_PROVIDER_SITE_OTHER): Payer: Medicare HMO | Admitting: Family

## 2016-09-08 ENCOUNTER — Encounter: Payer: Self-pay | Admitting: Family

## 2016-09-08 VITALS — BP 112/68 | HR 80 | Temp 98.5°F | Resp 16 | Ht 67.0 in | Wt 174.8 lb

## 2016-09-08 DIAGNOSIS — R5383 Other fatigue: Secondary | ICD-10-CM | POA: Diagnosis not present

## 2016-09-08 MED ORDER — TRAZODONE HCL 50 MG PO TABS: 50.0000 mg | ORAL_TABLET | Freq: Every evening | ORAL | 2 refills | Status: DC | PRN

## 2016-09-08 NOTE — Progress Notes (Signed)
Subjective:    Patient ID: Hannah Barron, female    DOB: 1971-01-03, 47 y.o.   MRN: [id number]  Chief Complaint  Patient presents with  . Fatigue    has been feeling very tired and low on engergy, weak, blurred vision, x2 weeks    HPI:  Hannah Barron is a 46 y.o. female who  has a past medical history of Anemia; Bronchiectasis ([place]); COPD (chronic obstructive pulmonary disease) ([place]); GERD (gastroesophageal reflux disease); Migraines; Paroxysmal SVT (supraventricular tachycardia) ([place]); and Right leg numbness. and presents today for an acute office visit.   This is a new problem. Associated symptoms of fatigue, low energy, weakness and blurred vision have been going on for about 2 weeks. She completed blood work which all appeared within normal limits and the ANA was negative. Previous thyroid blood work was negative. Reports that she is eating okay. Does have long term sleeping problems where she is unable to sleep and averaging about 3 hours per night. She was previously maintained on trazodone which did help her to sleep on a nightly basis.   No Known Allergies    Outpatient Medications Prior to Visit  Medication Sig Dispense Refill  . albuterol (VENTOLIN HFA) 108 (90 Base) MCG/ACT inhaler Inhale 2 puffs into the lungs every 6 (six) hours as needed for wheezing or shortness of breath.    . budesonide-formoterol (SYMBICORT) 160-4.5 MCG/ACT inhaler Inhale 2 puffs into the lungs 2 (two) times daily. 1 Inhaler 4  . Fe Fum-FePoly-FA-Vit C-Vit B3 (INTEGRA F) 125-1 MG CAPS Take 1 capsule by mouth daily. 30 capsule 2  . ibuprofen (ADVIL,MOTRIN) 600 MG tablet Take 1 tablet (600 mg total) by mouth every 6 (six) hours as needed for moderate pain. 30 tablet 1  . metoprolol succinate (TOPROL-XL) 50 MG 24 hr tablet Take 1 tablet (50mg ) in morning and 1/2 tablet (25mg ) in evening with or immediately following a meal. 135 tablet 1  . omeprazole (PRILOSEC) 40 MG capsule Take 1 capsule (40 mg total) by  mouth daily. 30 capsule 2  . Respiratory Therapy Supplies (FLUTTER) DEVI Use as directed. 1 each 0  . RESTASIS 0.05 % ophthalmic emulsion INSTILL 1 DROP IN EACH EYE TWICE DAILY.  2   No facility-administered medications prior to visit.      Past Medical History:  Diagnosis Date  . Anemia   . Bronchiectasis ([place])   . COPD (chronic obstructive pulmonary disease) ([place])   . GERD (gastroesophageal reflux disease)   . Migraines   . Paroxysmal SVT (supraventricular tachycardia) ([place])    a. diagnosed in 11/2015.  . Right leg numbness       Review of Systems  Constitutional: Positive for fatigue. Negative for appetite change, chills, fever and unexpected weight change.  Respiratory: Positive for shortness of breath. Negative for chest tightness.   Cardiovascular: Negative for chest pain, palpitations and leg swelling.  Neurological: Negative for weakness and numbness.  Psychiatric/Behavioral: Positive for sleep disturbance.      Objective:    BP 112/68 (BP Location: Left Arm, Patient Position: Sitting, Cuff Size: Large)   Pulse 80   Temp 98.5 F (36.9 C) (Oral)   Resp 16   Ht 5\' 7"  (1.702 m)   Wt 174 lb 12.8 oz (79.3 kg)   LMP 03/31/2016   SpO2 98%   BMI 27.38 kg/m  Nursing note and vital signs reviewed.  Physical Exam  Constitutional: She is oriented to person, place, and time. She appears well-developed and well-nourished. No  distress.  Cardiovascular: Normal rate, regular rhythm, normal heart sounds and intact distal pulses.  Exam reveals no gallop and no friction rub.   No murmur heard. Pulmonary/Chest: Effort normal and breath sounds normal. No respiratory distress. She has no wheezes. She has no rales. She exhibits no tenderness.  Neurological: She is alert and oriented to person, place, and time.  Skin: Skin is warm and dry.  Psychiatric: She has a normal mood and affect. Her behavior is normal. Judgment and thought content normal.       Assessment & Plan:    Problem List Items Addressed This Visit      Other   Fatigue - Primary    Symptoms of fatigue most likely related to decreased sleep given most recent blood work being negative for metabolic causes. Cannot rule out sleep apnea or depression. Restart trazodone for sleep. Follow up if symptoms worsen or do not improve.           I am having Ms. Julin start on traZODone. I am also having her maintain her RESTASIS, budesonide-formoterol, FLUTTER, albuterol, INTEGRA F, omeprazole, ibuprofen, and metoprolol succinate.   Meds ordered this encounter  Medications  . traZODone (DESYREL) 50 MG tablet    Sig: Take 1 tablet (50 mg total) by mouth at bedtime as needed for sleep.    Dispense:  30 tablet    Refill:  2    Order Specific Question:   Supervising Provider    Answer:   Pricilla Holm A [[id number]]     Follow-up: Return in about 1 month (around 10/09/2016), or if symptoms worsen or fail to improve.  Mauricio Po, FNP

## 2016-09-08 NOTE — Assessment & Plan Note (Signed)
Symptoms of fatigue most likely related to decreased sleep given most recent blood work being negative for metabolic causes. Cannot rule out sleep apnea or depression. Restart trazodone for sleep. Follow up if symptoms worsen or do not improve.

## 2016-09-08 NOTE — Patient Instructions (Signed)
Thank you for choosing [facility].  SUMMARY AND INSTRUCTIONS:  Please restart the Trazodone.  Follow up with pulmonology for potential sleep study.  If your symptoms do not improve we will consider additional testing.   Medication:  Your prescription(s) have been submitted to your pharmacy or been printed and provided for you. Please take as directed and contact our office if you believe you are having problem(s) with the medication(s) or have any questions.  Follow up:  If your symptoms worsen or fail to improve, please contact our office for further instruction, or in case of emergency go directly to the emergency room at the closest medical facility.

## 2016-09-12 ENCOUNTER — Encounter (HOSPITAL_COMMUNITY): Payer: Self-pay | Admitting: Emergency Medicine

## 2016-09-12 ENCOUNTER — Emergency Department (HOSPITAL_COMMUNITY)
Admission: EM | Admit: 2016-09-12 | Discharge: 2016-09-12 | Disposition: A | Discharge status: Home / Self Care | Payer: Medicare HMO | Attending: Emergency Medicine | Admitting: Emergency Medicine

## 2016-09-12 DIAGNOSIS — R1030 Lower abdominal pain, unspecified: Secondary | ICD-10-CM

## 2016-09-12 DIAGNOSIS — Z79899 Other long term (current) drug therapy: Secondary | ICD-10-CM | POA: Insufficient documentation

## 2016-09-12 DIAGNOSIS — R5383 Other fatigue: Secondary | ICD-10-CM | POA: Diagnosis not present

## 2016-09-12 DIAGNOSIS — J449 Chronic obstructive pulmonary disease, unspecified: Secondary | ICD-10-CM | POA: Diagnosis not present

## 2016-09-12 LAB — URINALYSIS, ROUTINE W REFLEX MICROSCOPIC
Bilirubin Urine: NEGATIVE
Glucose, UA: NEGATIVE mg/dL
Hgb urine dipstick: NEGATIVE
KETONES UR: NEGATIVE mg/dL
Nitrite: NEGATIVE
PH: 6 (ref 5.0–8.0)
Protein, ur: NEGATIVE mg/dL
SPECIFIC GRAVITY, URINE: 1.016 (ref 1.005–1.030)

## 2016-09-12 LAB — CBC
HCT: 34.7 % — ABNORMAL LOW (ref 36.0–46.0)
Hemoglobin: 11.5 g/dL — ABNORMAL LOW (ref 12.0–15.0)
MCH: 30.3 pg (ref 26.0–34.0)
MCHC: 33.1 g/dL (ref 30.0–36.0)
MCV: 91.3 fL (ref 78.0–100.0)
PLATELETS: 305 10*3/uL (ref 150–400)
RBC: 3.8 MIL/uL — ABNORMAL LOW (ref 3.87–5.11)
RDW: 13.4 % (ref 11.5–15.5)
WBC: 5.5 10*3/uL (ref 4.0–10.5)

## 2016-09-12 LAB — COMPREHENSIVE METABOLIC PANEL
ALBUMIN: 3.5 g/dL (ref 3.5–5.0)
ALT: 17 U/L (ref 14–54)
AST: 22 U/L (ref 15–41)
Alkaline Phosphatase: 62 U/L (ref 38–126)
Anion gap: 8 (ref 5–15)
BILIRUBIN TOTAL: 0.4 mg/dL (ref 0.3–1.2)
BUN: 9 mg/dL (ref 6–20)
CALCIUM: 9 mg/dL (ref 8.9–10.3)
CO2: 23 mmol/L (ref 22–32)
CREATININE: 0.82 mg/dL (ref 0.44–1.00)
Chloride: 105 mmol/L (ref 101–111)
GFR calc Af Amer: >60 mL/min (ref >60)
GFR calc non Af Amer: >60 mL/min (ref >60)
Glucose, Bld: 113 mg/dL — ABNORMAL HIGH (ref 65–99)
POTASSIUM: 3.8 mmol/L (ref 3.5–5.1)
Sodium: 136 mmol/L (ref 135–145)
Total Protein: 6.9 g/dL (ref 6.5–8.1)

## 2016-09-12 LAB — LIPASE, BLOOD: Lipase: 41 U/L (ref 11–51)

## 2016-09-12 MED ORDER — IBUPROFEN 800 MG PO TABS: 800.0000 mg | ORAL_TABLET | Freq: Three times a day (TID) | ORAL | 0 refills | Status: AC | PRN

## 2016-09-12 NOTE — ED Triage Notes (Signed)
Pt states she has abd pain for 5 days. Denies N/V. Pt states she feels very fatigued from the pain.

## 2016-09-12 NOTE — ED Provider Notes (Signed)
[facility] DEPT Provider Note   CSN: [id number] Arrival date & time: 09/12/16  1426     History   Chief Complaint Chief Complaint  Patient presents with  . Abdominal Pain    HPI Nayleen Kosa is a 46 y.o. female.  46 year old female with past medical history including bronchiectasis, migraines, paroxysmal SVT, hysterectomy who presents with abdominal pain. The patient reports 5 days of constant abdominal pain across her lower abdomen associated with fatigue and decreased energy. The pain does not change with eating. No associated nausea, vomiting, diarrhea, urinary symptoms, or vaginal bleeding/discharge. No fevers. She has not tried any medications for her symptoms.   The history is provided by the patient.  Abdominal Pain      Past Medical History:  Diagnosis Date  . Anemia   . Bronchiectasis ([place])   . COPD (chronic obstructive pulmonary disease) ([place])   . GERD (gastroesophageal reflux disease)   . Migraines   . Paroxysmal SVT (supraventricular tachycardia) ([place])    a. diagnosed in 11/2015.  . Right leg numbness     Patient Active Problem List   Diagnosis Date Noted  . Medicare annual wellness visit, subsequent 04/28/2016  . Routine adult health maintenance 04/28/2016  . Pelvic pain in female   . Post-operative state 04/05/2016  . Fibroids, intramural 04/01/2016  . Abnormal uterine bleeding (AUB) 04/01/2016  . Preoperative clearance 03/18/2016  . Lung nodules 03/16/2016  . Chest pain 11/05/2015  . Chest pressure 11/05/2015  . Palpitations   . GERD (gastroesophageal reflux disease) 07/30/2015  . Bronchiectasis without acute exacerbation ([place]) 07/08/2015  . COPD (chronic obstructive pulmonary disease) ([place]) 06/04/2015  . Fatigue 06/04/2015    Past Surgical History:  Procedure Laterality Date  . CESAREAN SECTION    . CYSTOSCOPY N/A 04/05/2016   Procedure: CYSTOSCOPY;  Surgeon: Lavonia Drafts, MD;  Location: [facility];  Service: Gynecology;  Laterality:  N/A;  . LAPAROSCOPIC VAGINAL HYSTERECTOMY WITH SALPINGECTOMY Bilateral 04/05/2016   Procedure: LAPAROSCOPIC ASSISTED VAGINAL HYSTERECTOMY WITH SALPINGECTOMY;  Surgeon: Lavonia Drafts, MD;  Location: [facility];  Service: Gynecology;  Laterality: Bilateral;  . LUNG BIOPSY    . VIDEO BRONCHOSCOPY Bilateral 03/29/2016   Procedure: VIDEO BRONCHOSCOPY WITHOUT FLUORO;  Surgeon: Marshell Garfinkel, MD;  Location: [facility];  Service: Cardiopulmonary;  Laterality: Bilateral;    OB History    Gravida Para Term Preterm AB Living   1 1 1     1    SAB TAB Ectopic Multiple Live Births                   Home Medications    Prior to Admission medications   Medication Sig Start Date End Date Taking? Authorizing Provider  albuterol (VENTOLIN HFA) 108 (90 Base) MCG/ACT inhaler Inhale 2 puffs into the lungs every 6 (six) hours as needed for wheezing or shortness of breath.   Yes [provider]  budesonide-formoterol (SYMBICORT) 160-4.5 MCG/ACT inhaler Inhale 2 puffs into the lungs 2 (two) times daily. 01/14/16  Yes Magdalen Spatz, NP  ibuprofen (ADVIL,MOTRIN) 600 MG tablet Take 1 tablet (600 mg total) by mouth every 6 (six) hours as needed for moderate pain. 04/28/16  Yes Golden Circle, FNP  metoprolol succinate (TOPROL-XL) 50 MG 24 hr tablet Take 1 tablet (50mg ) in morning and 1/2 tablet (25mg ) in evening with or immediately following a meal. Patient taking differently: Take 25-50 mg by mouth See admin instructions. 50 mg in the morning and 25 mg in the evening 08/24/16  Yes Almyra Deforest, PA  omeprazole (PRILOSEC) 40 MG capsule Take 1 capsule (40 mg total) by mouth daily. 04/28/16  Yes Golden Circle, FNP  RESTASIS 0.05 % ophthalmic emulsion Instill 1 drop into both eyes two times a day 04/25/15  Yes [provider]  traZODone (DESYREL) 50 MG tablet Take 1 tablet (50 mg total) by mouth at bedtime as needed for sleep. Patient taking differently: Take 50 mg by mouth at bedtime.  09/08/16   Yes Golden Circle, FNP  Fe Fum-FePoly-FA-Vit C-Vit B3 (INTEGRA F) 125-1 MG CAPS Take 1 capsule by mouth daily. Patient not taking: Reported on 09/12/2016 04/07/16   Lavonia Drafts, MD  Respiratory Therapy Supplies (FLUTTER) DEVI Use as directed. 03/16/16   Parrett, Fonnie Mu, NP    Family History Family History  Problem Relation Age of Onset  . Healthy Mother   . Healthy Father     Social History Social History  Substance Use Topics  . Smoking status: Never Smoker  . Smokeless tobacco: Never Used  . Alcohol use No     Allergies   Patient has no known allergies.   Review of Systems Review of Systems  Gastrointestinal: Positive for abdominal pain.   All other systems reviewed and are negative except that which was mentioned in HPI   Physical Exam Updated Vital Signs BP 104/65   Pulse 71   Temp 98.6 F (37 C) (Oral)   Resp 16   Ht 5\' 7"  (1.702 m)   Wt 78.5 kg (173 lb)   LMP 03/31/2016   SpO2 100%   BMI 27.10 kg/m   Physical Exam  Constitutional: She is oriented to person, place, and time. She appears well-developed and well-nourished. No distress.  HENT:  Head: Normocephalic and atraumatic.  Mouth/Throat: Oropharynx is clear and moist.  Moist mucous membranes  Eyes: Pupils are equal, round, and reactive to light. Conjunctivae are normal.  Neck: Neck supple.  Cardiovascular: Normal rate, regular rhythm and normal heart sounds.   No murmur heard. Pulmonary/Chest: Effort normal and breath sounds normal.  Abdominal: Soft. Bowel sounds are normal. She exhibits no distension and no mass. There is tenderness (across lower abdomen, worst in suprapubic abd). There is no rebound and no guarding.  Musculoskeletal: She exhibits no edema.  Neurological: She is alert and oriented to person, place, and time.  Fluent speech  Skin: Skin is warm and dry.  Psychiatric: She has a normal mood and affect. Judgment normal.  Nursing note and vitals reviewed.    ED  Treatments / Results  Labs (all labs ordered are listed, but only abnormal results are displayed) Labs Reviewed  COMPREHENSIVE METABOLIC PANEL - Abnormal; Notable for the following:       Result Value   Glucose, Bld 113 (*)    All other components within normal limits  CBC - Abnormal; Notable for the following:    RBC 3.80 (*)    Hemoglobin 11.5 (*)    HCT 34.7 (*)    All other components within normal limits  URINALYSIS, ROUTINE W REFLEX MICROSCOPIC - Abnormal; Notable for the following:    APPearance HAZY (*)    Leukocytes, UA LARGE (*)    Bacteria, UA RARE (*)    Squamous Epithelial / LPF 0-5 (*)    All other components within normal limits  URINE CULTURE  LIPASE, BLOOD    EKG  EKG Interpretation None       Radiology No results found.  Procedures Procedures (including critical  care time)  Medications Ordered in ED Medications - No data to display   Initial Impression / Assessment and Plan / ED Course  I have reviewed the triage vital signs and the nursing notes.  Pertinent labs results that were available during my care of the patient were reviewed by me and considered in my medical decision making (see chart for details).     PT w/ 5d constant abdominal pain across lower abdomen with no other associated sx. She was well-appearing on exam with normal vital signs, afebrile. She had some mild tenderness across her lower abdomen. No distention or peritonitis. Her lab work here is unremarkable including normal CMP, CBC, and lipase. UA had large leukocytes with no other signs of infection, at a urine culture but patient denies any urinary symptoms. She has had a hysterectomy with salpingectomy therefore I highly doubt any GYN process such as PID, endometriosis. No diarrhea, vomiting, or other associated sx and no leukocytosis to suggest appendicitis, bowel obstruction, or diverticulitis, especially given almost 1 week of constant sx. I reviewed her chart which shows a  visit with her OB/GYN earlier this month for pelvic pain and a recent PCP visit for fatigue. She states today that sx are the same as during these previous visits. I feel she is safe for discharge without any further imaging at this time. I have recommended follow-up with PCP and I have extensively reviewed return precautions. Patient voiced understanding and was discharged in satisfactory condition.  Final Clinical Impressions(s) / ED Diagnoses   Final diagnoses:  Lower abdominal pain  Fatigue, unspecified type    New Prescriptions New Prescriptions   No medications on file     Madgie Dhaliwal, Wenda Overland, MD 09/12/16 [phone]

## 2016-09-13 ENCOUNTER — Ambulatory Visit (INDEPENDENT_AMBULATORY_CARE_PROVIDER_SITE_OTHER)
Admission: RE | Admit: 2016-09-13 | Discharge: 2016-09-13 | Disposition: A | Discharge status: Home / Self Care | Payer: Medicare HMO | Source: Ambulatory Visit | Attending: Pulmonary Disease | Admitting: Pulmonary Disease

## 2016-09-13 ENCOUNTER — Ambulatory Visit (INDEPENDENT_AMBULATORY_CARE_PROVIDER_SITE_OTHER): Payer: Medicare HMO | Admitting: Pulmonary Disease

## 2016-09-13 ENCOUNTER — Encounter: Payer: Self-pay | Admitting: Pulmonary Disease

## 2016-09-13 VITALS — BP 120/74 | HR 85 | Ht 67.0 in | Wt 174.4 lb

## 2016-09-13 DIAGNOSIS — R079 Chest pain, unspecified: Secondary | ICD-10-CM | POA: Diagnosis not present

## 2016-09-13 DIAGNOSIS — J479 Bronchiectasis, uncomplicated: Secondary | ICD-10-CM

## 2016-09-13 DIAGNOSIS — R0602 Shortness of breath: Secondary | ICD-10-CM | POA: Diagnosis not present

## 2016-09-13 IMAGING — DX DG CHEST 2V
2 series · 2 of 2 positions shown · non-contrast
Comparison: [DATE].

CLINICAL DATA: Shortness of breath for the past week. Intermittent
chest pain and fatigue for 1 week.

EXAM:
CHEST  2 VIEW

[chest pa]
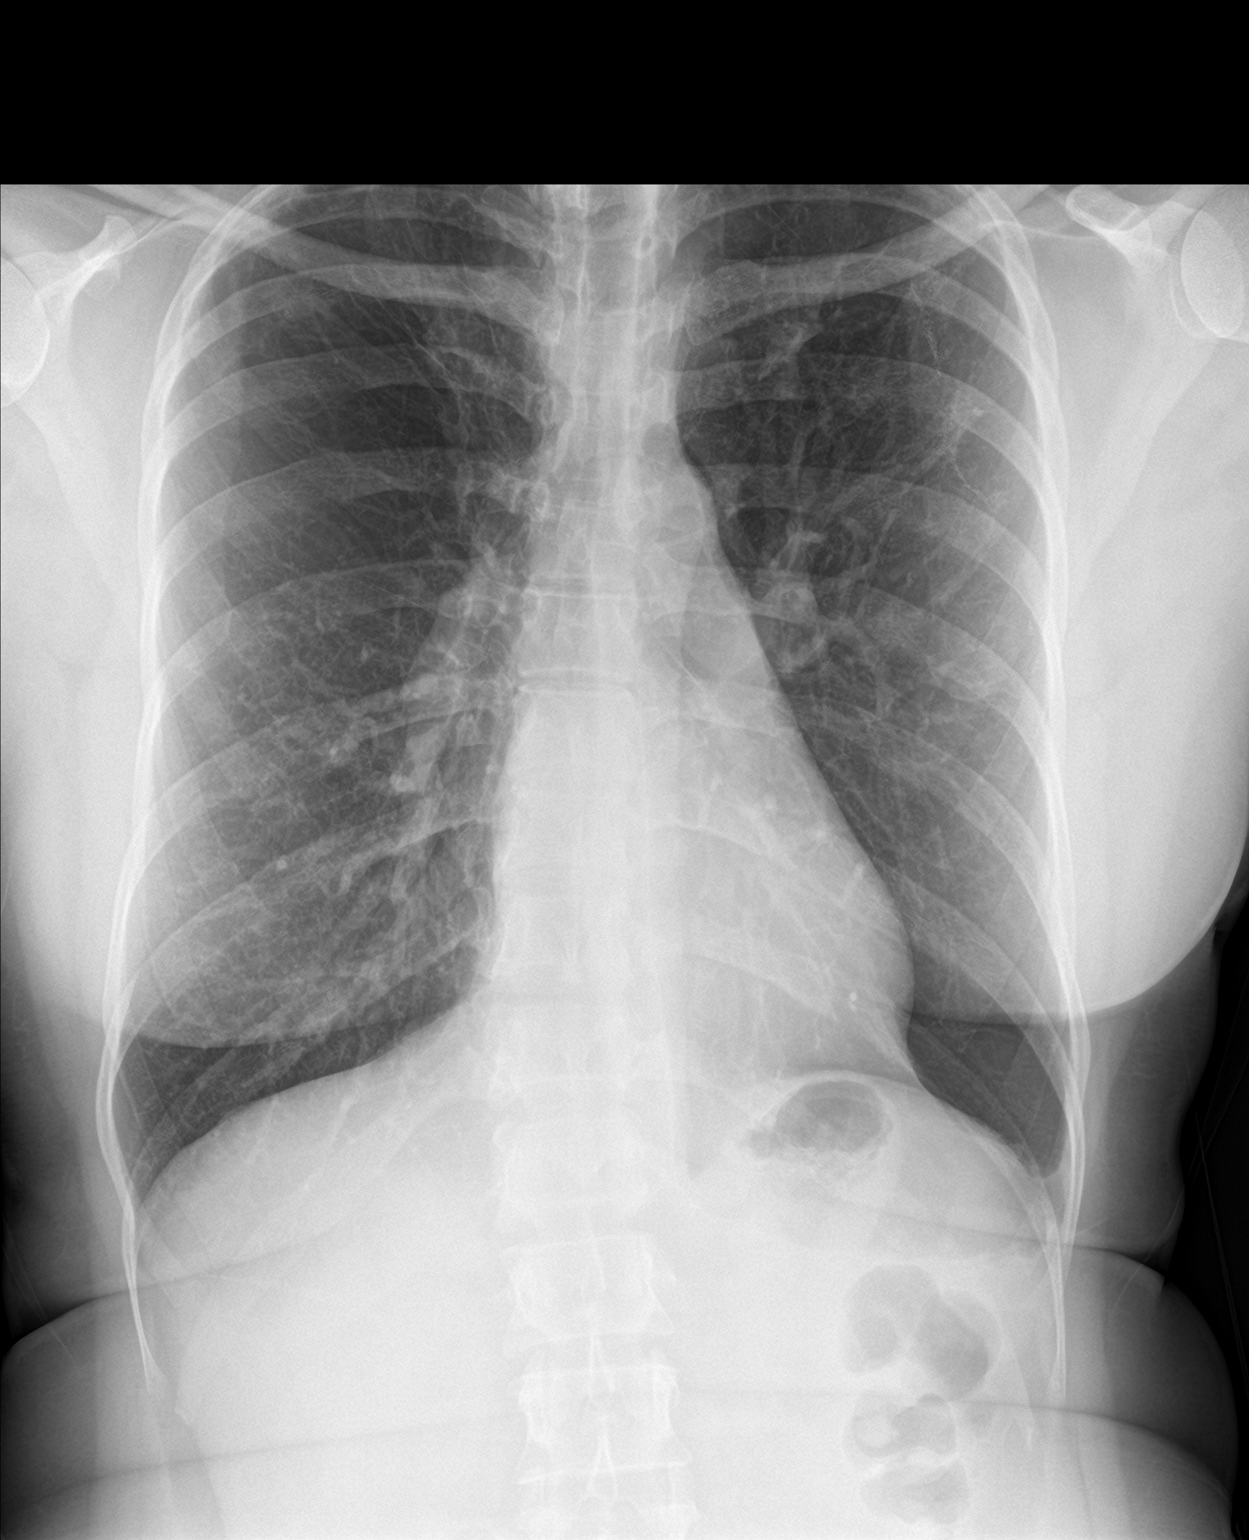

[chest lat]
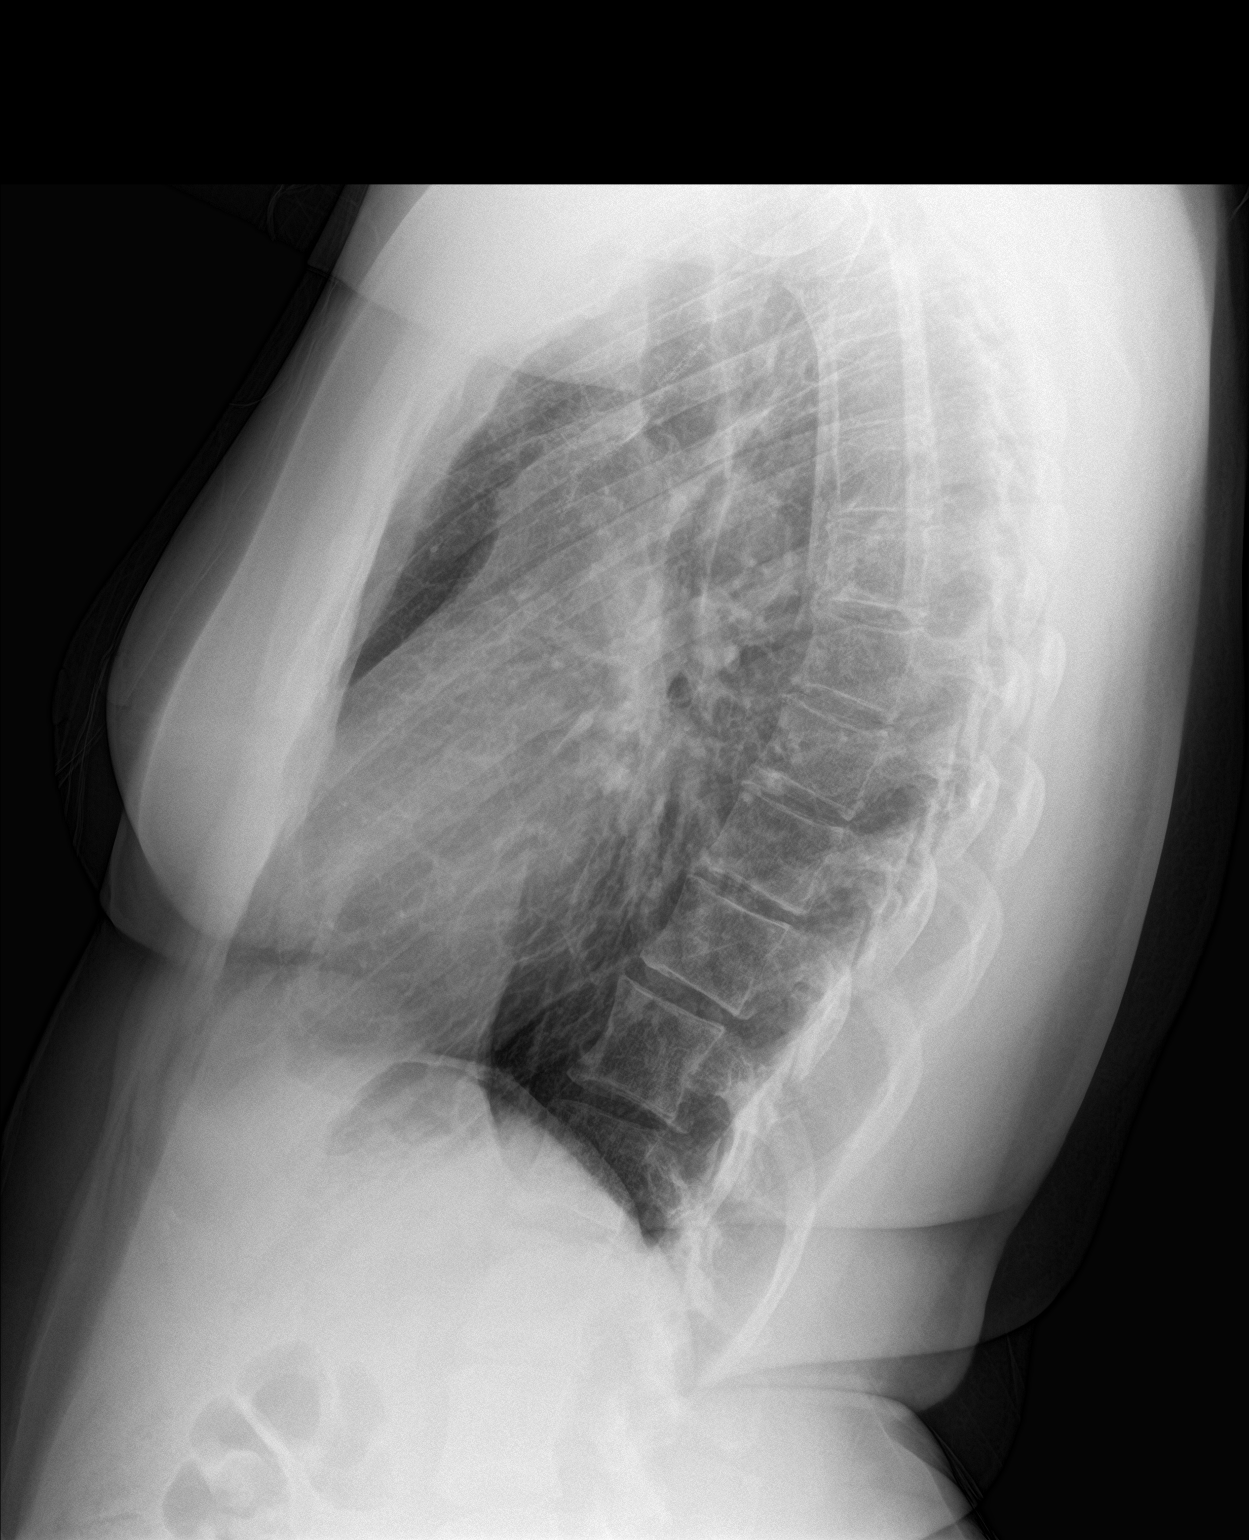

[2 of 2 positions shown; findings below may reference images not displayed]

FINDINGS: Normal sized heart. Stable mild biapical pleural and parenchymal
scarring. The lungs remain hyperexpanded. Left upper lobe surgical
staples are again demonstrated. Mild scoliosis.
IMPRESSION: No acute abnormality.  Stable mild changes of COPD.

## 2016-09-13 NOTE — Progress Notes (Signed)
Hannah Barron    [id number]    [birth year]  Primary Care Physician:Calone, Ples Specter, FNP  Referring Physician: Golden Circle, [place] [place], [place] [address]  Chief complaint:  Follow up for  Cystic bronchiectasis  HPI: 46 year old with chronic obstructive lung disease, bronchiectasis. She was previously followed at [place] with a VATS biopsy in 2003 and was told she had cystic bronchiectasis. She had followed up with a pulmonologist at [facility] but has moved to [place] on 2017. We have been unable to get records from [place] in spite of multiple attempts.  She reports intermittent symptoms of cough with mucus production. She denies any fevers, chills. She continues on her inhalers including Symbicort and albuterol rescue inhaler. She is an immigrant from [place] in 1998 and does not report any exposure to tuberculosis. She does not recall getting a BCG vaccine as a child.  She underwent a bronchoscopy to evaluate for MAI infection and a positive quantiferon as she was unable to give a good sputum sample. All results are negative except for positive PJP on DFA. She underwent further evaluation with negative HIV and beta glucan tests. She had a hysterectomy for uterine fibroids  On 3/27  Interim history: She did well postop. However for the past  month she complains of increasing fatigue, Low energy, blurry vision. She had an evaluation in the emergency room yesterday with normal labs. She has chronic cough with sputum production, occasional hemoptysis. She denies any fevers, chills.  She was seen by her primary care last week and started on trazodone for possible depression.   Outpatient Encounter Prescriptions as of 09/13/2016  Medication Sig  . albuterol (VENTOLIN HFA) 108 (90 Base) MCG/ACT inhaler Inhale 2 puffs into the lungs every 6 (six) hours as needed for wheezing or shortness of breath.  . budesonide-formoterol (SYMBICORT) 160-4.5 MCG/ACT  inhaler Inhale 2 puffs into the lungs 2 (two) times daily.  . Fe Fum-FePoly-FA-Vit C-Vit B3 (INTEGRA F) 125-1 MG CAPS Take 1 capsule by mouth daily.  Marland Kitchen ibuprofen (ADVIL,MOTRIN) 800 MG tablet Take 1 tablet (800 mg total) by mouth every 8 (eight) hours as needed for mild pain or moderate pain.  . metoprolol succinate (TOPROL-XL) 50 MG 24 hr tablet Take 1 tablet (50mg ) in morning and 1/2 tablet (25mg ) in evening with or immediately following a meal. (Patient taking differently: Take 25-50 mg by mouth See admin instructions. 50 mg in the morning and 25 mg in the evening)  . omeprazole (PRILOSEC) 40 MG capsule Take 1 capsule (40 mg total) by mouth daily.  Marland Kitchen Respiratory Therapy Supplies (FLUTTER) DEVI Use as directed.  . RESTASIS 0.05 % ophthalmic emulsion Instill 1 drop into both eyes two times a day  . traZODone (DESYREL) 50 MG tablet Take 1 tablet (50 mg total) by mouth at bedtime as needed for sleep. (Patient taking differently: Take 50 mg by mouth at bedtime. )   No facility-administered encounter medications on file as of 09/13/2016.     Allergies as of 09/13/2016  . (No Known Allergies)    Past Medical History:  Diagnosis Date  . Anemia   . Bronchiectasis ([place])   . COPD (chronic obstructive pulmonary disease) ([place])   . GERD (gastroesophageal reflux disease)   . Migraines   . Paroxysmal SVT (supraventricular tachycardia) ([place])    a. diagnosed in 11/2015.  . Right leg numbness     Past Surgical History:  Procedure Laterality Date  .  CESAREAN SECTION    . CYSTOSCOPY N/A 04/05/2016   Procedure: CYSTOSCOPY;  Surgeon: Lavonia Drafts, MD;  Location: [facility];  Service: Gynecology;  Laterality: N/A;  . LAPAROSCOPIC VAGINAL HYSTERECTOMY WITH SALPINGECTOMY Bilateral 04/05/2016   Procedure: LAPAROSCOPIC ASSISTED VAGINAL HYSTERECTOMY WITH SALPINGECTOMY;  Surgeon: Lavonia Drafts, MD;  Location: [facility];  Service: Gynecology;  Laterality: Bilateral;  . LUNG BIOPSY    . VIDEO  BRONCHOSCOPY Bilateral 03/29/2016   Procedure: VIDEO BRONCHOSCOPY WITHOUT FLUORO;  Surgeon: Marshell Garfinkel, MD;  Location: [facility];  Service: Cardiopulmonary;  Laterality: Bilateral;    Family History  Problem Relation Age of Onset  . Healthy Mother   . Healthy Father     Social History   Social History  . Marital status: Married    Spouse name: N/A  . Number of children: 1  . Years of education: 64   Occupational History  . disability    Social History Main Topics  . Smoking status: Never Smoker  . Smokeless tobacco: Never Used  . Alcohol use No  . Drug use: No  . Sexual activity: Yes   Other Topics Concern  . Not on file   Social History Narrative   Fun: Watch TV    Review of systems: Review of Systems  Constitutional: Negative for fever and chills.  HENT: Negative.   Eyes: Negative for blurred vision.  Respiratory: as per HPI  Cardiovascular: Negative for chest pain and palpitations.  Gastrointestinal: Negative for vomiting, diarrhea, blood per rectum. Genitourinary: Negative for dysuria, urgency, frequency and hematuria.  Musculoskeletal: Negative for myalgias, back pain and joint pain.  Skin: Negative for itching and rash.  Neurological: Negative for dizziness, tremors, focal weakness, seizures and loss of consciousness.  Endo/Heme/Allergies: Negative for environmental allergies.  Psychiatric/Behavioral: Negative for depression, suicidal ideas and hallucinations.  All other systems reviewed and are negative.  Physical Exam: Blood pressure 116/70, pulse (!) 106, height 5\' 7"  (1.702 m), weight 172 lb 12.8 oz (78.4 kg), SpO2 98 %. Gen:      No acute distress HEENT:  EOMI, sclera anicteric Neck:     No masses; no thyromegaly Lungs:    Clear to auscultation bilaterally; normal respiratory effort CV:         Regular rate and rhythm; no murmurs Abd:      + bowel sounds; soft, non-tender; no palpable masses, no distension Ext:    No edema; adequate  peripheral perfusion Skin:      Warm and dry; no rash Neuro: alert and oriented x 3 Psych: normal mood and affect  Data Reviewed: A1AT 03/16/16- 161, PIMM Quantiferon 03/16/16- Positive CF panel 03/25/15- negative for 97 mutations analyzed (report scanned)  Labs 03/24/16 IgG 1477 IgA 134 IgM 168 IgE 6 ANA, CCP, RA- negative  HIV 04/01/16- Negative Beta D glucan 04/01/16- Negative  CT high resolution 07/16/15-areas of cystic and varicose bronchiectasis, extensive air trapping, multiple pulmonary nodules. CT chest 03/21/16-areas of bronchiectasis, at trapping and pulmonary nodules. Unchanged compared to 2017 CXR 03/29/16- mild hazy perihilar opacities likely secondary to large, no pneumothorax. I have reviewed all images personally.  Myocardial perfusion study 12/2015 >neg  Ischemia , EF 55% Echo 10/2015 >EF 65%.   PFT 06/2015  FVC 2.45 (70%) FEV1 1.54 [56%) F/F 63 TLC 78% Moderate obstructive airway disease, minimal restriction. DLCO could not be completed  03/16/16 FVC 2.29 (69%) FEV1 1.46 (54%) F/F 64  Bronchoscopy 03/29/16 Cultures, AFB, fungal cultures-negative to date PJP DFA-positive Cytology-no malignant cells, CD4: CD8 ratio-1.62  Cell count WBC-26, 26% lymphs, 50% neutrophils, 24% monocyte macrophage  Assessment:  Cystic bronchiectasis She's had workup which is negative for alpha-1 antitrypsin, immunoglobulin deficiency, autoimmune disease, cystic fibrosis and mycobacterial infections. She is doing well on Symbicort and flutter valve. We discussed starting her on smart vest that since a flutter valve is doing well we with continue this for now.  We will repeat CXR today to make sure there is no new infiltrate  Positive PJP DFA This is likely a false positive assessment evidence of immunodeficiency, HIV is negative. Besides her CT scan is not characteristic of pneumocystis infection and beta D glucan is negative. We will continue to observe for now.  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 Discussed INH therapy but defer as per pt wishes  Plan/Recommendations: - Continue symbicort, albuterol - Flutter valve.  - CXR  Marshell Garfinkel MD [facility] Pager [phone] 09/13/2016, 2:42 PM  CC: Golden Circle, FNP

## 2016-09-13 NOTE — Patient Instructions (Addendum)
We'll get a chest x-ray today just to make sure there is no change  Follow-up in 6 months.

## 2016-09-14 LAB — URINE CULTURE: SPECIAL REQUESTS: NORMAL

## 2016-09-15 ENCOUNTER — Telehealth: Payer: Self-pay | Admitting: *Deleted

## 2016-09-15 NOTE — Telephone Encounter (Signed)
Post ED Visit - Positive Culture Follow-up  Culture report reviewed by antimicrobial stewardship pharmacist:  []  Elenor Quinones, Pharm.D. []  Heide Guile, Pharm.D., BCPS AQ-ID []  Parks Neptune, Pharm.D., BCPS []  Alycia Rossetti, Pharm.D., BCPS []  [place], Pharm.D., BCPS, AAHIVP []  Legrand Como, Pharm.D., BCPS, AAHIVP []  Salome Arnt, PharmD, BCPS []  Dimitri Ped, PharmD, BCPS []  Vincenza Hews, PharmD, BCPS  Positive urine culture, seen by OB-GYN and PCP and no further patient follow-up is required at this time.  Harlon Flor [facility] 09/15/2016, 3:01 PM

## 2016-10-17 ENCOUNTER — Encounter: Payer: Self-pay | Admitting: Cardiology

## 2016-10-24 NOTE — Progress Notes (Signed)
Cardiology Office Note    Date:  10/26/2016   ID:  Hannah Barron, DOB [birth year], MRN [id number]  PCP:  Golden Circle, FNP  Cardiologist: Dr. Martinique  Chief Complaint  Patient presents with  . Follow-up    6 weeks  . Shortness of Breath    History of Present Illness:    Hannah Barron is a 46 y.o. female with past medical history of Bronchiectasis and migraine headaches who presents to the office today for follow-up of palpitations.  She was admitted from 10/26 - 11/07/2015 at [facility] for evaluation of palpitations and chest pain. Cyclic troponin values were minimally elevated at 0.04, 0.14, and 0.04 with her EKG showing no acute ST or T-wave changes. Telemetry was reviewed which showed episodes of sinus tachycardia and this was thought to concur with her symptoms. TSH WNL. Echocardiogram was obtained which showed an EF of 65-70% with no regional WMA.   Was seen in the office by Cecilie Kicks, NP on 11/13/2015 and a 30-day event monitor was arranged to evaluate for tachyarrhythmias. An exercise treadmill stress test was performed on 11/22 and showed 1-2 mm of ST depression in the anterior and lateral leads. With this being abnormal, a Lexiscan Myoview was recommended for further ischemic evaluation and was scheduled for 12/18/2015. This showed normal perfusion with < 1 mm ST depression. This was felt to be low risk.    On 12/10/2015, [employer] called reporting the patient had an episode of SVT with peak HR of 171. Reported symptoms at that time were palpitations and dizziness. She was started on metoprolol.  She underwent vaginal hysterectomy in March for uterine fibroids.   On follow up today she still has times where her heart goes "boom-boom" and she feels sweaty. She just rests and it goes away. She has chronic chest tightness. Minimal cough. No edema.   Past Medical History:  Diagnosis Date  . Anemia   . Bronchiectasis ([place])   . COPD (chronic obstructive pulmonary  disease) ([place])   . GERD (gastroesophageal reflux disease)   . Migraines   . Paroxysmal SVT (supraventricular tachycardia) ([place])    a. diagnosed in 11/2015.  . Right leg numbness     Past Surgical History:  Procedure Laterality Date  . CESAREAN SECTION    . CYSTOSCOPY N/A 04/05/2016   Procedure: CYSTOSCOPY;  Surgeon: Lavonia Drafts, MD;  Location: [facility];  Service: Gynecology;  Laterality: N/A;  . LAPAROSCOPIC VAGINAL HYSTERECTOMY WITH SALPINGECTOMY Bilateral 04/05/2016   Procedure: LAPAROSCOPIC ASSISTED VAGINAL HYSTERECTOMY WITH SALPINGECTOMY;  Surgeon: Lavonia Drafts, MD;  Location: [facility];  Service: Gynecology;  Laterality: Bilateral;  . LUNG BIOPSY    . VIDEO BRONCHOSCOPY Bilateral 03/29/2016   Procedure: VIDEO BRONCHOSCOPY WITHOUT FLUORO;  Surgeon: Marshell Garfinkel, MD;  Location: [facility];  Service: Cardiopulmonary;  Laterality: Bilateral;    Current Medications: Outpatient Medications Prior to Visit  Medication Sig Dispense Refill  . albuterol (VENTOLIN HFA) 108 (90 Base) MCG/ACT inhaler Inhale 2 puffs into the lungs every 6 (six) hours as needed for wheezing or shortness of breath.    . budesonide-formoterol (SYMBICORT) 160-4.5 MCG/ACT inhaler Inhale 2 puffs into the lungs 2 (two) times daily. 1 Inhaler 4  . Fe Fum-FePoly-FA-Vit C-Vit B3 (INTEGRA F) 125-1 MG CAPS Take 1 capsule by mouth daily. 30 capsule 2  . metoprolol succinate (TOPROL-XL) 50 MG 24 hr tablet Take 1 tablet (51m) in morning and 1/2 tablet (261m in evening with or immediately following a meal. (Patient taking  differently: Take 25-50 mg by mouth See admin instructions. 50 mg in the morning and 25 mg in the evening) 135 tablet 1  . omeprazole (PRILOSEC) 40 MG capsule Take 1 capsule (40 mg total) by mouth daily. 30 capsule 2  . Respiratory Therapy Supplies (FLUTTER) DEVI Use as directed. 1 each 0  . RESTASIS 0.05 % ophthalmic emulsion Instill 1 drop into both eyes two times a day  2  . traZODone  (DESYREL) 50 MG tablet Take 1 tablet (50 mg total) by mouth at bedtime as needed for sleep. (Patient taking differently: Take 50 mg by mouth at bedtime. ) 30 tablet 2   No facility-administered medications prior to visit.      Allergies:   Patient has no known allergies.   Social History   Social History  . Marital status: Married    Spouse name: N/A  . Number of children: 1  . Years of education: 67   Occupational History  . disability    Social History Main Topics  . Smoking status: Never Smoker  . Smokeless tobacco: Never Used  . Alcohol use No  . Drug use: No  . Sexual activity: Yes   Other Topics Concern  . None   Social History Narrative   Fun: Watch TV     Family History:  The patient's family history includes Healthy in her father and mother. No family history of CAD or cardiac arrhythmias.   Review of Systems:   Please see the history of present illness.     As noted in HPI.   All other systems reviewed and are otherwise negative except as noted above.   Physical Exam:    VS:  BP 114/62   Pulse 90   Ht _0  (1.702 m)   Wt 179 lb (81.2 kg)   LMP 03/31/2016   BMI 28.04 kg/m    GENERAL:  Well appearing African female in NAD HEENT:  PERRL, EOMI, sclera are clear. Oropharynx is clear. NECK:  No jugular venous distention, carotid upstroke brisk and symmetric, no bruits, no thyromegaly or adenopathy LUNGS:  Clear to auscultation bilaterally CHEST:  Unremarkable HEART:  RRR,  PMI not displaced or sustained,S1 and S2 within normal limits, no S3, no S4: no clicks, no rubs, no murmurs ABD:  Soft, nontender. BS +, no masses or bruits. No hepatomegaly, no splenomegaly EXT:  2 + pulses throughout, no edema, no cyanosis no clubbing SKIN:  Warm and dry.  No rashes NEURO:  Alert and oriented x 3. Cranial nerves II through XII intact. PSYCH:  Cognitively intact    Wt Readings from Last 3 Encounters:  10/26/16 179 lb (81.2 kg)  09/13/16 174 lb 6.4 oz (79.1 kg)   09/12/16 173 lb (78.5 kg)    Studies/Labs Reviewed:   EKG:  EKG is not ordered today.    Recent Labs: 01/15/2016: TSH 1.40 09/12/2016: ALT 17; BUN 9; Creatinine, Ser 0.82; Hemoglobin 11.5; Platelets 305; Potassium 3.8; Sodium 136   Lipid Panel    Component Value Date/Time   CHOL 194 04/28/2016 1614   TRIG 108.0 04/28/2016 1614   HDL 62.00 04/28/2016 1614   CHOLHDL 3 04/28/2016 1614   VLDL 21.6 04/28/2016 1614   LDLCALC 111 (H) 04/28/2016 1614    Additional studies/ records that were reviewed today include:   Echocardiogram: 10/2015 Study Conclusions  - Left ventricle: The cavity size was normal. There was mild focal   basal hypertrophy of the septum. Systolic function was vigorous.  The estimated ejection fraction was in the range of 65% to 70%.   Wall motion was normal; there were no regional wall motion   abnormalities. Left ventricular diastolic function parameters   were normal. - Aortic valve: Trileaflet; normal thickness, mildly calcified   leaflets.  Myoview: 12/18/15: Study Highlights    The left ventricular ejection fraction is normal (55-65%).  Nuclear stress EF: 64%.  The myocardial perufusion images are normal.  There is borderline transient ischemic dilatation with a TID of 1.19. Consider further evaluation with coronary CTA if clinically indicated.  This is an intermediate risk study due to borderline TID.  Blood pressure demonstrated a normal response to exercise.  There was 65m of J point depression with upsloping ST segment depression in the inferolateral leads     [employer] Monitoring: 12/10/2015: SVT with HR peaking at 171 bpm (lasting for less than 30 seconds) - Will have records scanned into the system.  Assessment:    1. SVT (supraventricular tachycardia) (HCC)   2. Palpitations   3. Other chest pain      Plan:   In order of problems listed above:  1. SVT/ Palpitations/ sinus tachycardia  other cardiac evaluation is  unremarkable. This appears to be well controlled on Toprol XL.  Avoid stimulants. Follow up in one year  2. Chest Pressure - occurring in the setting of her palpitations when her heart is "racing and pounding out of her chest". - otherwise likely related to bronchiectasis.  - prior ischemic evaluation was negative. Normal LV function.  3. Bronchiectasis  - followed by Pulmonology. Negative BAL.  - continue  Xopenex  - Flu shot given today   Medication Adjustments/Labs and Tests Ordered: Current medicines are reviewed at length with the patient today.  Concerns regarding medicines are outlined above.  Medication changes, Labs and Tests ordered today are listed in the Patient Instructions below.    Signed, Kaizer Dissinger JMartinique MD,FACC  10/26/2016 3:23 PM    [facility] [address] [place] [address]Phone: [phone]

## 2016-10-26 ENCOUNTER — Ambulatory Visit (INDEPENDENT_AMBULATORY_CARE_PROVIDER_SITE_OTHER): Payer: Medicare HMO | Admitting: Cardiology

## 2016-10-26 ENCOUNTER — Encounter: Payer: Self-pay | Admitting: Cardiology

## 2016-10-26 VITALS — BP 114/62 | HR 90 | Ht 67.0 in | Wt 179.0 lb

## 2016-10-26 DIAGNOSIS — I471 Supraventricular tachycardia: Secondary | ICD-10-CM | POA: Diagnosis not present

## 2016-10-26 DIAGNOSIS — R002 Palpitations: Secondary | ICD-10-CM

## 2016-10-26 DIAGNOSIS — Z23 Encounter for immunization: Secondary | ICD-10-CM | POA: Diagnosis not present

## 2016-10-26 DIAGNOSIS — R0789 Other chest pain: Secondary | ICD-10-CM | POA: Diagnosis not present

## 2016-10-26 NOTE — Patient Instructions (Signed)
Continue your current therapy  I will see you in one year   

## 2016-11-28 ENCOUNTER — Ambulatory Visit: Payer: Medicare HMO | Admitting: Pulmonary Disease

## 2016-11-28 ENCOUNTER — Encounter: Payer: Self-pay | Admitting: Pulmonary Disease

## 2016-11-28 VITALS — BP 120/70 | HR 90 | Ht 67.0 in | Wt 182.2 lb

## 2016-11-28 DIAGNOSIS — J479 Bronchiectasis, uncomplicated: Secondary | ICD-10-CM | POA: Diagnosis not present

## 2016-11-28 NOTE — Progress Notes (Signed)
Hannah Barron    [id number]    Mar 30, 1970  Primary Care Physician:Calone, Ples Specter, FNP  Referring Physician: Golden Circle, [address] [place], [place] [address]  Chief complaint:  Follow up for  Cystic bronchiectasis  HPI: 46 year old with chronic obstructive lung disease, bronchiectasis. She was previously followed at [place] with a VATS biopsy in 2003 and was told she had cystic bronchiectasis. She had followed up with a pulmonologist at [facility] but has moved to [place] on 2017. We have been unable to get records from [place] in spite of multiple attempts.  She is an immigrant from [place] in 1998 and does not report any exposure to tuberculosis. She does not recall getting a BCG vaccine as a child. Underwent a bronchoscopy to evaluate for MAI infection and a positive quantiferon as she was unable to give a good sputum sample. All results are negative except for positive PJP on DFA. She underwent further evaluation with negative HIV and beta glucan tests. She had a hysterectomy for uterine fibroids  On 3/27  Interim history: She continues to have baseline cough with white mucus, denies any wheezing, fevers, chills, hemoptysis.  Her main complaint is chronic fatigue.  Not using her flutter valve on a regular basis.  Outpatient Encounter Medications as of 11/28/2016  Medication Sig  . albuterol (VENTOLIN HFA) 108 (90 Base) MCG/ACT inhaler Inhale 2 puffs into the lungs every 6 (six) hours as needed for wheezing or shortness of breath.  . budesonide-formoterol (SYMBICORT) 160-4.5 MCG/ACT inhaler Inhale 2 puffs into the lungs 2 (two) times daily.  . metoprolol succinate (TOPROL-XL) 50 MG 24 hr tablet Take 1 tablet (50mg ) in morning and 1/2 tablet (25mg ) in evening with or immediately following a meal. (Patient taking differently: Take 25-50 mg by mouth See admin instructions. 50 mg in the morning and 25 mg in the evening)  . omeprazole  (PRILOSEC) 40 MG capsule Take 1 capsule (40 mg total) by mouth daily.  Marland Kitchen Respiratory Therapy Supplies (FLUTTER) DEVI Use as directed.  . RESTASIS 0.05 % ophthalmic emulsion Instill 1 drop into both eyes two times a day  . Fe Fum-FePoly-FA-Vit C-Vit B3 (INTEGRA F) 125-1 MG CAPS Take 1 capsule by mouth daily. (Patient not taking: Reported on 11/28/2016)  . traZODone (DESYREL) 50 MG tablet Take 1 tablet (50 mg total) by mouth at bedtime as needed for sleep. (Patient not taking: Reported on 11/28/2016)   No facility-administered encounter medications on file as of 11/28/2016.     Allergies as of 11/28/2016  . (No Known Allergies)    Past Medical History:  Diagnosis Date  . Anemia   . Bronchiectasis ([place])   . COPD (chronic obstructive pulmonary disease) ([place])   . GERD (gastroesophageal reflux disease)   . Migraines   . Paroxysmal SVT (supraventricular tachycardia) ([place])    a. diagnosed in 11/2015.  . Right leg numbness     Past Surgical History:  Procedure Laterality Date  . CESAREAN SECTION    . CYSTOSCOPY N/A 04/05/2016   Performed by Lavonia Drafts, MD at [facility]  . LAPAROSCOPIC ASSISTED VAGINAL HYSTERECTOMY WITH SALPINGECTOMY Bilateral 04/05/2016   Performed by Lavonia Drafts, MD at [facility]  . LUNG BIOPSY    . VIDEO BRONCHOSCOPY WITHOUT FLUORO Bilateral 03/29/2016   Performed by Marshell Garfinkel, MD at [facility]COPY    Family History  Problem Relation Age of Onset  . Healthy Mother   .  Healthy Father     Social History   Socioeconomic History  . Marital status: Married    Spouse name: Not on file  . Number of children: 1  . Years of education: 33  . Highest education level: Not on file  Social Needs  . Financial resource strain: Not on file  . Food insecurity - worry: Not on file  . Food insecurity - inability: Not on file  . Transportation needs - medical: Not on file  . Transportation needs - non-medical: Not on file  Occupational History  .  Occupation: disability  Tobacco Use  . Smoking status: Never Smoker  . Smokeless tobacco: Never Used  Substance and Sexual Activity  . Alcohol use: No    Alcohol/week: 0.0 oz  . Drug use: No  . Sexual activity: Yes  Other Topics Concern  . Not on file  Social History Narrative   Fun: Watch TV    Review of systems: Review of Systems  Constitutional: Negative for fever and chills.  HENT: Negative.   Eyes: Negative for blurred vision.  Respiratory: as per HPI  Cardiovascular: Negative for chest pain and palpitations.  Gastrointestinal: Negative for vomiting, diarrhea, blood per rectum. Genitourinary: Negative for dysuria, urgency, frequency and hematuria.  Musculoskeletal: Negative for myalgias, back pain and joint pain.  Skin: Negative for itching and rash.  Neurological: Negative for dizziness, tremors, focal weakness, seizures and loss of consciousness.  Endo/Heme/Allergies: Negative for environmental allergies.  Psychiatric/Behavioral: Negative for depression, suicidal ideas and hallucinations.  All other systems reviewed and are negative.  Physical Exam: Blood pressure 120/70, pulse 90, height 5\' 7"  (1.702 m), weight 182 lb 3.2 oz (82.6 kg), last menstrual period 03/31/2016, SpO2 99 %. Gen:      No acute distress HEENT:  EOMI, sclera anicteric Neck:     No masses; no thyromegaly Lungs:    Clear to auscultation bilaterally; normal respiratory effort CV:         Regular rate and rhythm; no murmurs Abd:      + bowel sounds; soft, non-tender; no palpable masses, no distension Ext:    No edema; adequate peripheral perfusion Skin:      Warm and dry; no rash Neuro: alert and oriented x 3 Psych: normal mood and affect  Data Reviewed: A1AT 03/16/16- 161, PIMM Quantiferon 03/16/16- Positive CF panel 03/25/15- negative for 97 mutations analyzed (report scanned)  Labs 03/24/16 IgG 1477 IgA 134 IgM 168 IgE 6 ANA, CCP, RA- negative  HIV 04/01/16- Negative Beta D glucan 04/01/16-  Negative  CT high resolution 07/16/15-areas of cystic and varicose bronchiectasis, extensive air trapping, multiple pulmonary nodules. CT chest 03/21/16-areas of bronchiectasis, at trapping and pulmonary nodules. Unchanged compared to 2017 CXR 03/29/16- mild hazy perihilar opacities likely secondary to large, no pneumothorax. I have reviewed all images personally.  Myocardial perfusion study 12/2015 >neg  Ischemia , EF 55% Echo 10/2015 >EF 65%.   PFT 06/2015  FVC 2.45 (70%) FEV1 1.54 [56%) F/F 63 TLC 78% Moderate obstructive airway disease, minimal restriction. DLCO could not be completed  03/16/16 FVC 2.29 (69%) FEV1 1.46 (54%) F/F 64  Bronchoscopy 03/29/16 Cultures, AFB, fungal cultures-negative to date PJP DFA-positive Cytology-no malignant cells, CD4: CD8 ratio-1.62 Cell count WBC-26, 26% lymphs, 50% neutrophils, 24% monocyte macrophage  Assessment:  Cystic bronchiectasis She's had workup which is negative for alpha-1 antitrypsin, immunoglobulin deficiency, autoimmune disease, cystic fibrosis and mycobacterial infections. She is doing well on Symbicort but has not been compliant with a flutter valve.  I emphasized to her that she needs to use the flutter 3 times daily.  She will also use Mucinex over-the-counter and stay well-hydrated for clearance of secretion.  She will benefit from pulmonary rehabilitation and we will make the referral.   Positive PJP DFA This is likely a false positive as there is no evidence of immunodeficiency, HIV is negative. Besides her CT scan is not characteristic of pneumocystis infection and beta D glucan is negative. We will continue to observe for now.  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 Discussed INH therapy but defer as per pt wishes  Plan/Recommendations: - Continue symbicort, albuterol - Flutter valve,  Mucinex - Pulmonary rehab  Marshell Garfinkel MD  Pulmonary and Critical Care Pager [phone] 11/28/2016, 2:11 PM  CC: Golden Circle, FNP

## 2016-11-28 NOTE — Patient Instructions (Signed)
Continue the inhaler You will need to use the flutter valve 3 times a day.  Use Mucinex over-the-counter twice daily and will hydrate yourself well for clearance of secretions For pulmonary rehab at [facility] Follow-up in 6 months

## 2016-11-29 ENCOUNTER — Telehealth (HOSPITAL_COMMUNITY): Payer: Self-pay

## 2016-11-29 NOTE — Telephone Encounter (Signed)
Attempted to call patient in regards to Pulmonary Rehab. LMTCB °

## 2016-11-29 NOTE — Telephone Encounter (Signed)
Patient returned phone call in regards to Pulmonary Rehab - Patient is interested in the program. Patient is aware of the $30 co-pay. Scheduled orientation on 12/26/16 at 1:30pm. Patient will attend the 1:30pm exc class.

## 2016-11-29 NOTE — Telephone Encounter (Signed)
Patients insurance is active and benefits verified through [facility] [place] - $30.00 co-pay, no deductible, out of pocket amount of $4,500/$1,205.88 has been met, no co-insurance, and no pre-authorization is required. Reference #[id number]  Patient will be contacted and scheduled.

## 2016-12-22 ENCOUNTER — Other Ambulatory Visit: Payer: Self-pay | Admitting: Acute Care

## 2016-12-22 ENCOUNTER — Ambulatory Visit: Payer: Medicare HMO | Admitting: Emergency Medicine

## 2016-12-22 ENCOUNTER — Encounter: Payer: Self-pay | Admitting: Emergency Medicine

## 2016-12-22 DIAGNOSIS — J471 Bronchiectasis with (acute) exacerbation: Secondary | ICD-10-CM | POA: Diagnosis not present

## 2016-12-22 DIAGNOSIS — R0789 Other chest pain: Secondary | ICD-10-CM | POA: Diagnosis not present

## 2016-12-22 MED ORDER — LEVOFLOXACIN 500 MG PO TABS: 500.0000 mg | ORAL_TABLET | Freq: Every day | ORAL | 0 refills | Status: DC

## 2016-12-22 NOTE — Assessment & Plan Note (Signed)
Given her sputum, response to albuterol, previous similar flares I suspect that this is an exacerbation of her bronchiectasis.  All the same given her chest pain, no hemoptysis I believe it will need to be worked up further as below.  I will treat her with levofloxacin.  Continue her bronchodilators as ordered.  Continue her flutter valve.  I discussed her case with Dr. Vaughan Browner.  CT scan will likely help with evaluation of her bronchiectasis and her micronodular disease as well.  She may ultimately need bronchoscopy.

## 2016-12-22 NOTE — Patient Instructions (Signed)
We will perform a CT scan of your chest Please take Levaquin 500 mg once a day for the next 10 days. Continue Symbicort 2 puffs twice a day Continue albuterol 2 puffs as needed Continue your flutter valve twice a day Please follow with Dr. Vaughan Browner in 2 weeks to review your status

## 2016-12-22 NOTE — Progress Notes (Signed)
Subjective:    Patient ID: Hannah Barron, female    DOB: Feb 08, 1970, 46 y.o.   MRN: [id number]  HPI 46 year old never smoker followed in our office by Dr. Vaughan Browner for cystic bronchiectasis.  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   She reports that she was well until yesterday, began to have chest pain, tightness, increase in cough. Has evolved more mucous, some blood tinged mucous, yellow. She is using flutter 2x a day reliably. Sx better w albuterol, but not resolved.  Had similar symptoms before in the past with bronchiectasis flares although her chest discomfort yesterday was worse than on previous occasions.   Review of Systems  Past Medical History:  Diagnosis Date  . Anemia   . Bronchiectasis ([place])   . COPD (chronic obstructive pulmonary disease) ([place])   . GERD (gastroesophageal reflux disease)   . Migraines   . Paroxysmal SVT (supraventricular tachycardia) ([place])    a. diagnosed in 11/2015.  . Right leg numbness      Family History  Problem Relation Age of Onset  . Healthy Mother   . Healthy Father      Social History   Socioeconomic History  . Marital status: Married    Spouse name: Not on file  . Number of children: 1  . Years of education: 7  . Highest education level: Not on file  Social Needs  . Financial resource strain: Not on file  . Food insecurity - worry: Not on file  . Food insecurity - inability: Not on file  . Transportation needs - medical: Not on file  . Transportation needs - non-medical: Not on file  Occupational History  . Occupation: disability  Tobacco Use  . Smoking status: Never Smoker  . Smokeless tobacco: Never Used  Substance and Sexual Activity  . Alcohol use: No    Alcohol/week: 0.0 oz  . Drug use: No  . Sexual activity: Yes  Other Topics Concern  . Not on file  Social History Narrative   Fun: Watch TV     No Known Allergies   Outpatient Medications Prior  to Visit  Medication Sig Dispense Refill  . albuterol (VENTOLIN HFA) 108 (90 Base) MCG/ACT inhaler Inhale 2 puffs into the lungs every 6 (six) hours as needed for wheezing or shortness of breath.    . budesonide-formoterol (SYMBICORT) 160-4.5 MCG/ACT inhaler Inhale 2 puffs into the lungs 2 (two) times daily. 1 Inhaler 4  . metoprolol succinate (TOPROL-XL) 50 MG 24 hr tablet Take 1 tablet (50mg ) in morning and 1/2 tablet (25mg ) in evening with or immediately following a meal. (Patient taking differently: Take 25-50 mg by mouth See admin instructions. 50 mg in the morning and 25 mg in the evening) 135 tablet 1  . omeprazole (PRILOSEC) 40 MG capsule Take 1 capsule (40 mg total) by mouth daily. 30 capsule 2  . Respiratory Therapy Supplies (FLUTTER) DEVI Use as directed. 1 each 0  . RESTASIS 0.05 % ophthalmic emulsion Instill 1 drop into both eyes two times a day  2  . Fe Fum-FePoly-FA-Vit C-Vit B3 (INTEGRA F) 125-1 MG CAPS Take 1 capsule by mouth daily. (Patient not taking: Reported on 11/28/2016) 30 capsule 2  . traZODone (DESYREL) 50 MG tablet Take 1 tablet (50 mg total) by mouth at bedtime as needed for sleep. (Patient not taking: Reported on 11/28/2016) 30 tablet 2   No facility-administered medications prior to visit.  Objective:   Physical Exam Vitals:   12/22/16 1554  BP: 110/70  Pulse: 98  SpO2: 99%  Weight: 182 lb (82.6 kg)  Height: 5\' 6"  (1.676 m)   Gen: Pleasant, well-nourished, in no distress,  normal affect  ENT: No lesions,  mouth clear,  oropharynx clear, no postnasal drip  Neck: No JVD, no stridor  Lungs: No use of accessory muscles, few scattered bilateral expiratory wheezes  Cardiovascular: RRR, heart sounds normal, no murmur or gallops, no peripheral edema  Musculoskeletal: No deformities, no cyanosis or clubbing  Neuro: alert, non focal  Skin: Warm, no lesions or rashes     Assessment & Plan:  Bronchiectasis with acute exacerbation (HCC) Given her  sputum, response to albuterol, previous similar flares I suspect that this is an exacerbation of her bronchiectasis.  All the same given her chest pain, no hemoptysis I believe it will need to be worked up further as below.  I will treat her with levofloxacin.  Continue her bronchodilators as ordered.  Continue her flutter valve.  I discussed her case with Dr. Vaughan Browner.  CT scan will likely help with evaluation of her bronchiectasis and her micronodular disease as well.  She may ultimately need bronchoscopy.  Chest pain Associated with new hemoptysis.  As above suspect that this is a flare of her bronchiectasis given similar episodes in the past.  Must also consider acute pulmonary embolism however.  I believe she needs a CTPA to evaluate further.  The CT will also assist with better characterization of her bronchiectasis and micronodular disease.  Baltazar Apo, MD, PhD 12/22/2016, 4:23 PM [facility] [phone] or if no answer [phone]

## 2016-12-22 NOTE — Assessment & Plan Note (Signed)
Associated with new hemoptysis.  As above suspect that this is a flare of her bronchiectasis given similar episodes in the past.  Must also consider acute pulmonary embolism however.  I believe she needs a CTPA to evaluate further.  The CT will also assist with better characterization of her bronchiectasis and micronodular disease.

## 2016-12-23 ENCOUNTER — Telehealth (HOSPITAL_COMMUNITY): Payer: Self-pay | Admitting: *Deleted

## 2016-12-23 ENCOUNTER — Ambulatory Visit (INDEPENDENT_AMBULATORY_CARE_PROVIDER_SITE_OTHER)
Admission: RE | Admit: 2016-12-23 | Discharge: 2016-12-23 | Disposition: A | Discharge status: Home / Self Care | Payer: Medicare HMO | Source: Ambulatory Visit | Attending: Emergency Medicine | Admitting: Emergency Medicine

## 2016-12-23 DIAGNOSIS — R0789 Other chest pain: Secondary | ICD-10-CM | POA: Diagnosis not present

## 2016-12-23 DIAGNOSIS — R0602 Shortness of breath: Secondary | ICD-10-CM | POA: Diagnosis not present

## 2016-12-23 DIAGNOSIS — R042 Hemoptysis: Secondary | ICD-10-CM | POA: Diagnosis not present

## 2016-12-23 IMAGING — CT CT ANGIO CHEST
2 of 7 series · 18 of 46 positions shown · IV contrast (ISOVUE 370)
Comparison: Chest CT [DATE] and chest radiograph GEDE

CLINICAL DATA: Shortness of breath and chest pain.  Hemoptysis.

EXAM:
CT ANGIOGRAPHY CHEST WITH CONTRAST
TECHNIQUE: Multidetector CT imaging of the chest was performed using the
standard protocol during bolus administration of intravenous
contrast. Multiplanar CT image reconstructions and MIPs were
obtained to evaluate the vascular anatomy.
CONTRAST:  80mL [PL] IOPAMIDOL ([PL]) INJECTION 76%

[Series 5: thins · axial · 0.62mm/px · z∈[-318,-36]mm · 15 of 309 slices shown]
[im 14/309  lung]
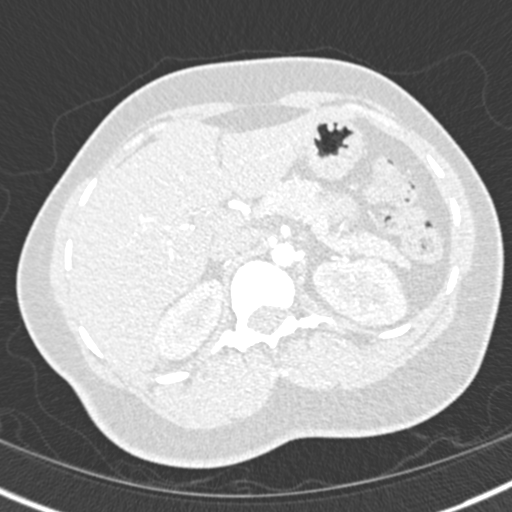
[im 41/309  soft-tissue]
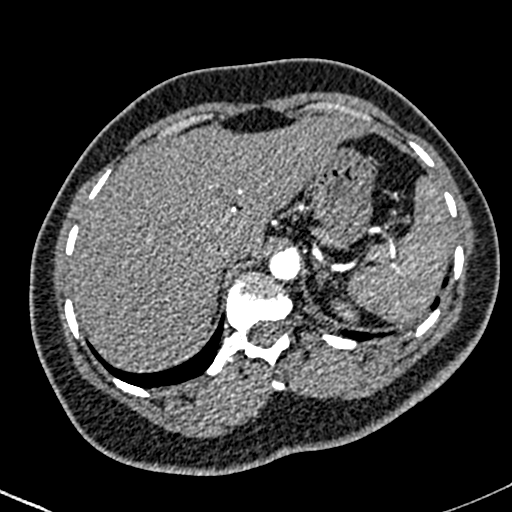
[im 54/309  lung]
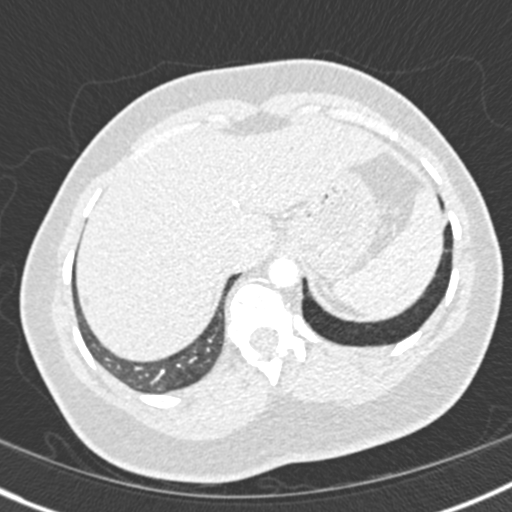
[im 81/309  soft-tissue]
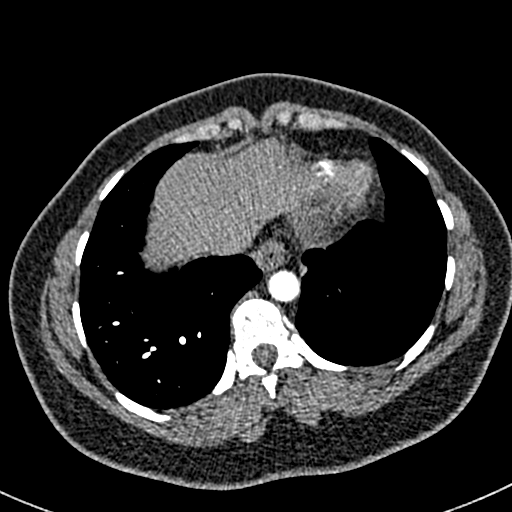
[im 94/309  lung]
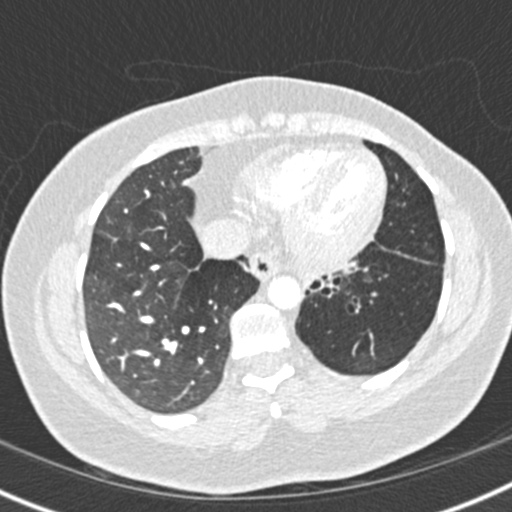
[im 121/309  soft-tissue]
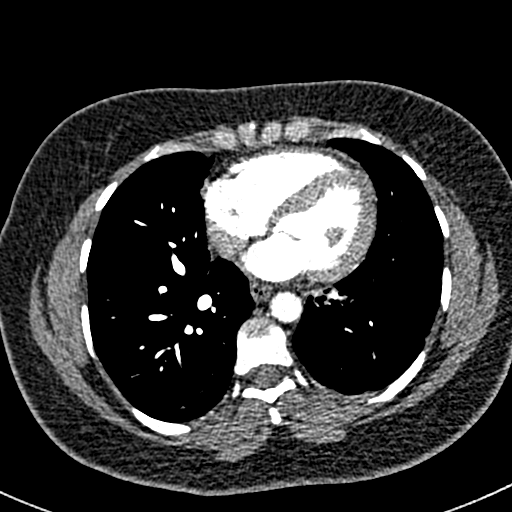
[im 134/309  lung]
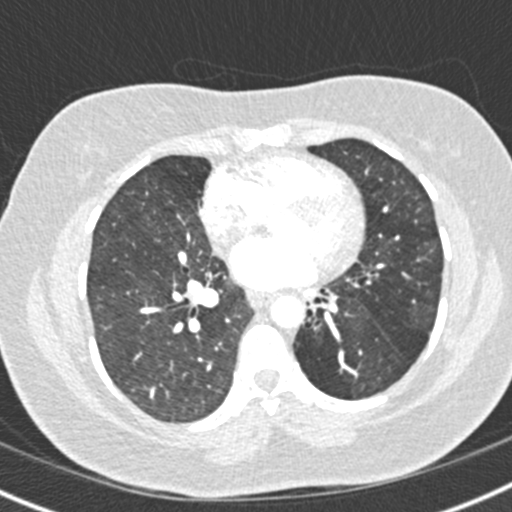
[im 161/309  soft-tissue]
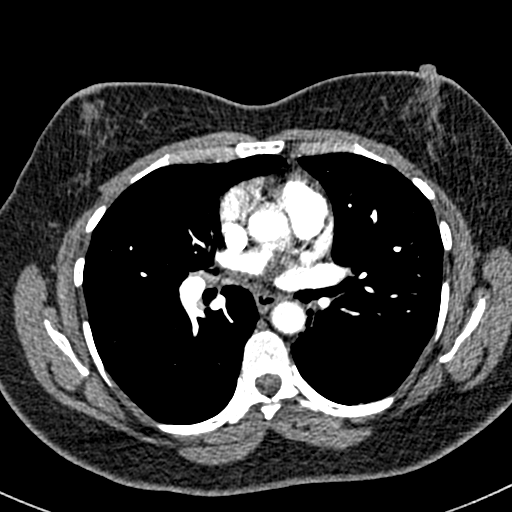
[im 175/309  lung]
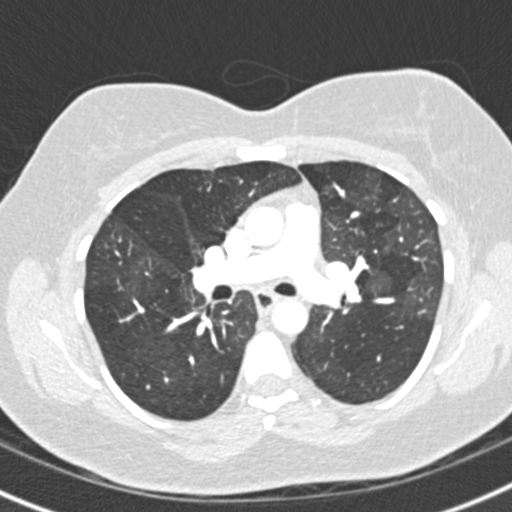
[im 188/309  soft-tissue]
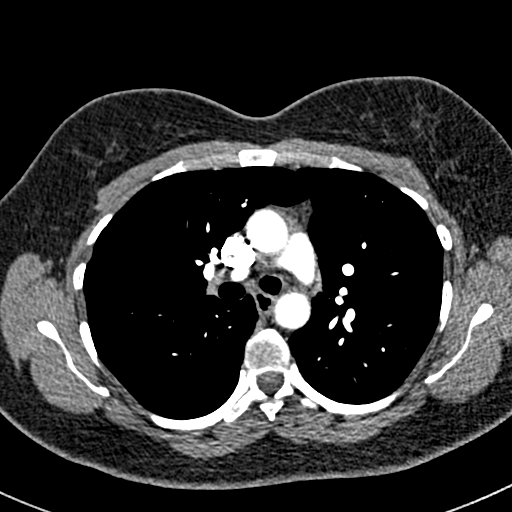
[im 215/309  lung]
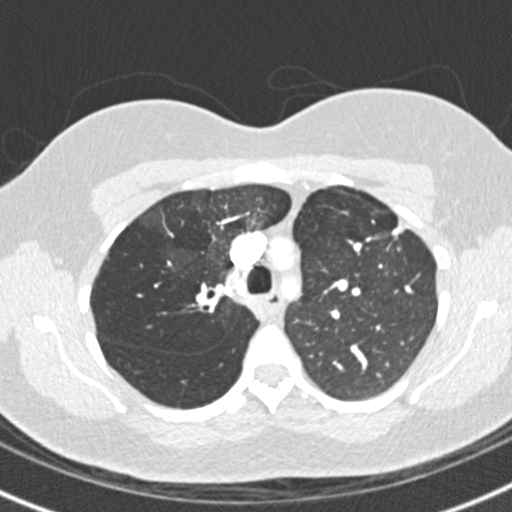
[im 228/309  soft-tissue]
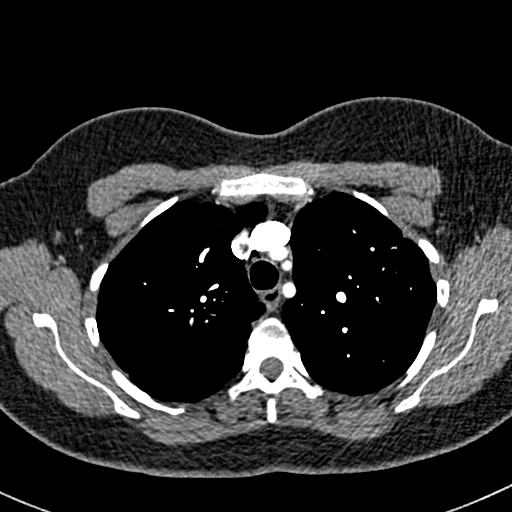
[im 255/309  lung]
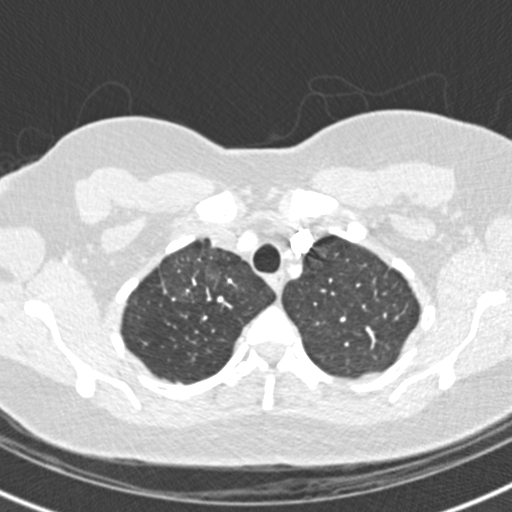
[im 268/309  soft-tissue]
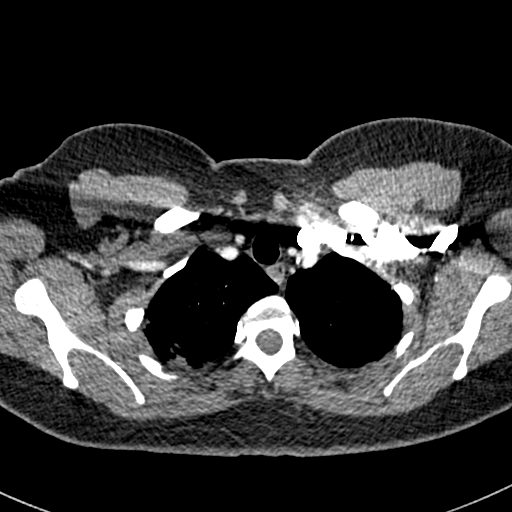
[im 295/309  lung]
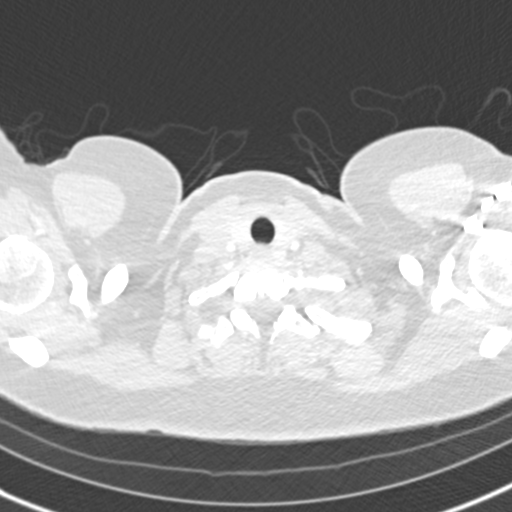

[Series 7: coronal mpr · coronal · 0.60mm/px · 3 of 106 slices shown]
[im 27/106  soft-tissue]
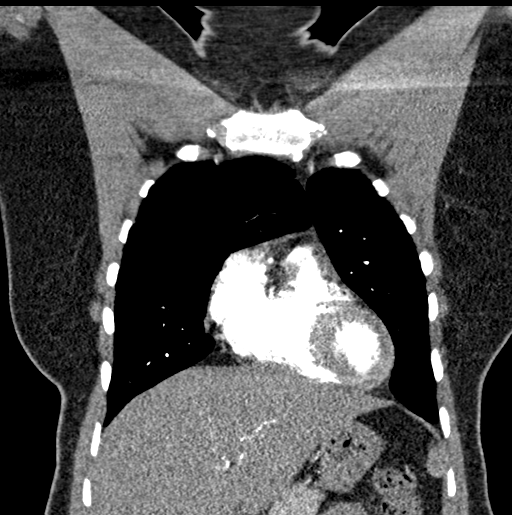
[im 53/106  soft-tissue]
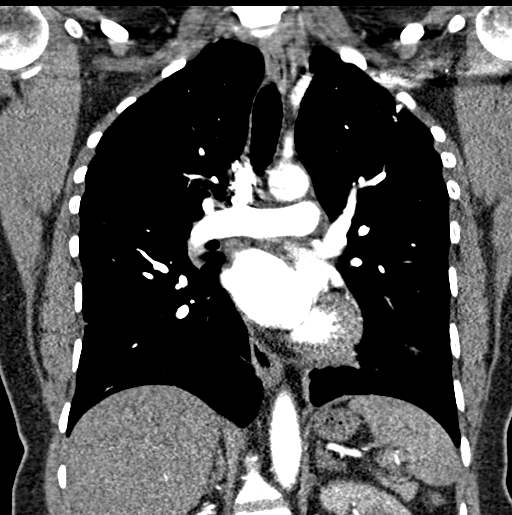
[im 79/106  soft-tissue]
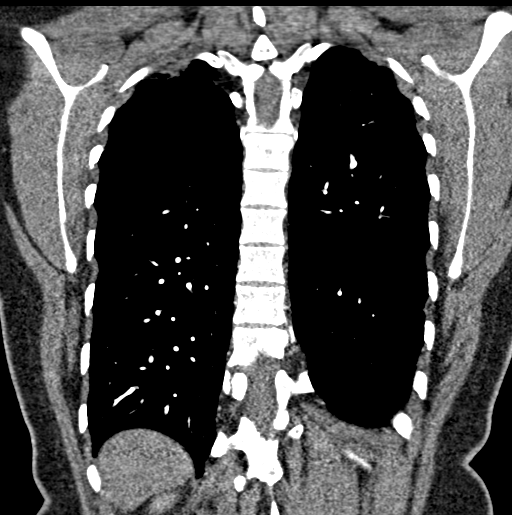

[18 of 46 positions shown; findings below may reference images not displayed]

FINDINGS: Cardiovascular: There is no demonstrable pulmonary embolus. There is
no thoracic aortic aneurysm or dissection. Visualized great vessels
appear unremarkable. There is no appreciable pericardial effusion or
thickening.

Mediastinum/Nodes: Visualized thyroid appears normal. There is no
appreciable thoracic adenopathy. No esophageal lesions are evident.

Lungs/Pleura: There is underlying centrilobular emphysematous
change. Scattered areas of scarring in the right apex remains
stable. Linear scarring in the anterior segment left upper lobe is
stable. On axial slice 25 series 6, there is a nodular opacity
measuring 5 x 5 mm, unchanged. On axial slice 37 series 6, there is
a nodular opacity measuring 5 x 5 mm in the superior segment left
lower lobe near the major fissure. On axial slice 36 series 6, there
is a nodular opacity measuring 8 x 7 mm in the anterior segment of
the right upper lobe along its more medial aspect, unchanged. There
is a stable 4 x 4 mm nodular opacity in the superior segment of the
right lower lobe, stable. There is a nodular opacity abutting the
pleura in the superior segment of the right lower lobe seen on axial
slice 54 series 6 measuring 7 x 5 mm, not felt to be changed given
slight difference in scan plane compared to prior study. No new
parenchymal lung lesions are identified.

There is bronchiectatic change in the right upper lobe as well as in
both lower lobes, most notably in the left lower lobe somewhat
medially and posteriorly. There are scattered areas of mosaic
attenuation, similar prior study. There is no well-defined
consolidation. No pleural effusion or pleural thickening.

Upper Abdomen: Visualized upper abdominal structures appear
unremarkable.

Musculoskeletal: There are no blastic or lytic bone lesions.

Review of the MIP images confirms the above findings.
IMPRESSION: 1.  No evident pulmonary embolus.

2. Underlying centrilobular emphysematous change with areas of
scarring. Bronchiectatic changes noted at multiple sites, most
notably in the left lower lobe region but also present in the right
lower lobe and right upper lobe regions.

3. Stable nodular opacities as described. No new pulmonary nodular
lesions evident.

4. No edema or consolidation. No pleural effusion. Mosaic
attenuation likely represents chronic small airways obstructive
disease/air trapping. The degree of mosaic attenuation appears
stable.

5.  No evident adenopathy.

Emphysema ([PL]-[PL]).

## 2016-12-23 MED ORDER — IOPAMIDOL (ISOVUE-370) INJECTION 76%
80.0000 mL | Freq: Once | INTRAVENOUS | Status: AC | PRN
  Administered 2016-12-23: 80 mL via INTRAVENOUS

## 2016-12-26 ENCOUNTER — Ambulatory Visit (HOSPITAL_COMMUNITY): Payer: Self-pay

## 2016-12-27 ENCOUNTER — Telehealth (HOSPITAL_COMMUNITY): Payer: Self-pay

## 2016-12-27 NOTE — Telephone Encounter (Signed)
Called and spoke with patient in regards to [place] orientation - Patient called on 12/23/2016 to reschedule as patient was ill. Rescheduled orientation for 02/03/2017 at 1:30pm. Patient will attend the 1:30pm exc class.

## 2016-12-28 ENCOUNTER — Ambulatory Visit (INDEPENDENT_AMBULATORY_CARE_PROVIDER_SITE_OTHER): Payer: Medicare HMO | Admitting: Nurse Practitioner

## 2016-12-28 ENCOUNTER — Other Ambulatory Visit (INDEPENDENT_AMBULATORY_CARE_PROVIDER_SITE_OTHER): Payer: Medicare HMO

## 2016-12-28 ENCOUNTER — Encounter: Payer: Self-pay | Admitting: Nurse Practitioner

## 2016-12-28 VITALS — BP 120/72 | HR 99 | Temp 98.5°F | Resp 20 | Ht 66.0 in | Wt 182.0 lb

## 2016-12-28 DIAGNOSIS — R232 Flushing: Secondary | ICD-10-CM

## 2016-12-28 DIAGNOSIS — L700 Acne vulgaris: Secondary | ICD-10-CM

## 2016-12-28 DIAGNOSIS — K2100 Gastro-esophageal reflux disease with esophagitis, without bleeding: Secondary | ICD-10-CM

## 2016-12-28 LAB — COMPREHENSIVE METABOLIC PANEL
ALBUMIN: 3.9 g/dL (ref 3.5–5.2)
ALK PHOS: 68 U/L (ref 39–117)
ALT: 13 U/L (ref 0–35)
AST: 15 U/L (ref 0–37)
BILIRUBIN TOTAL: 0.2 mg/dL (ref 0.2–1.2)
BUN: 11 mg/dL (ref 6–23)
CALCIUM: 9.1 mg/dL (ref 8.4–10.5)
CHLORIDE: 104 meq/L (ref 96–112)
CO2: 27 mEq/L (ref 19–32)
CREATININE: 0.74 mg/dL (ref 0.40–1.20)
GFR: 89.49 mL/min (ref >60.00)
Glucose, Bld: 87 mg/dL (ref 70–99)
Potassium: 4 mEq/L (ref 3.5–5.1)
Sodium: 138 mEq/L (ref 135–145)
Total Protein: 7.7 g/dL (ref 6.0–8.3)

## 2016-12-28 LAB — TSH: TSH: 0.92 u[IU]/mL (ref 0.35–4.50)

## 2016-12-28 LAB — CBC
HCT: 38.1 % (ref 36.0–46.0)
Hemoglobin: 12.5 g/dL (ref 12.0–15.0)
MCHC: 32.9 g/dL (ref 30.0–36.0)
MCV: 96.4 fl (ref 78.0–100.0)
Platelets: 286 10*3/uL (ref 150.0–400.0)
RBC: 3.95 Mil/uL (ref 3.87–5.11)
RDW: 13.2 % (ref 11.5–15.5)
WBC: 6.8 10*3/uL (ref 4.0–10.5)

## 2016-12-28 MED ORDER — ESOMEPRAZOLE MAGNESIUM 20 MG PO CPDR: 20.0000 mg | DELAYED_RELEASE_CAPSULE | Freq: Every day | ORAL | 0 refills | Status: DC

## 2016-12-28 MED ORDER — ALBUTEROL SULFATE HFA 108 (90 BASE) MCG/ACT IN AERS: 2.0000 | INHALATION_SPRAY | Freq: Four times a day (QID) | RESPIRATORY_TRACT | 3 refills | Status: DC | PRN

## 2016-12-28 NOTE — Patient Instructions (Addendum)
Please head downstairs for lab work.  I have placed a referral to gastroenterology and dermatology. Our office will call you to schedule this appointment. You should hear from our office in 7-10 days.  Please call your gynecologist for follow up of hot flashes. Let me know if you are unable to schedule an appointment with them.  I have sent a new prescription for nexium 20 mg. Please take this prescription twice daily for one week, then reduce to once daily. This is for your acid reflux.  I'd like to see you back in about 1 month to see how you are doing.  It was nice to meet you. Thanks for letting me take care of you today :)

## 2016-12-28 NOTE — Assessment & Plan Note (Signed)
She is out of her PPI. History consistent with GERD. Cardiology and pulmonology are also following with the patient. We will continue PPI and refer to GI further evaluation, she was followed by GI for routine endoscopies in the past, but did not re-establish GI care when she moved to [place] Medications ordered: - esomeprazole (NEXIUM) 20 MG capsule; Take 1 capsule (20 mg total) by mouth daily at 12 noon.  Dispense: 90 capsule; Refill: 0 BID x 1 week, then once daily Referrals ordered: - Ambulatory referral to Gastroenterology Return precautions given

## 2016-12-28 NOTE — Progress Notes (Addendum)
Subjective:    Patient ID: Hannah Barron, female    DOB: April 07, 1970, 46 y.o.   MRN: [id number]  HPI Hannah Barron is a 46 yo who presents today to establish care. She is transferring to me from another provider in the same clinic. She has the following significant medical problems: COPD, GERD, uterine fibroids, and lung nodules. She presents today with complaint of decreased appetite and medication refills.  Decreased appetite- This is a new problem. She makes food but does not feel like eating at all. She has noticed the problem over the past few weeks. She was followed by GI provider in [facility] for routine endoscopies for an "esophageal problem" but she can not name the exact diagnosis. When she moved to [place] she did not continue follow up with GI. She is following with pulmonology currently and had a recent CT scan and levaquin course for chest pain and shortness of breath. She is returning to pulmonology next week for follow up. She is also followed by cardiology every 6 months for palpitations. She reports night sweats, regurgitation, frequent belching, esophageal burning, upper abdominal pain, abdominal bloating, midsternal pain. Her symptoms increase when she is lying flat. She denies weakness, weight loss, nausea, vomiting, constipation, diarrhea, pale skin, jaundice.  Skin-  This is a chronic problem. This problem began years ago. She describes as severe acne to her face. She has tried variety of OTC acne medications with no relief. She has not been to dermatology.  Review of Systems  See HPI  Past Medical History:  Diagnosis Date  . Anemia   . Bronchiectasis ([place])   . COPD (chronic obstructive pulmonary disease) ([place])   . GERD (gastroesophageal reflux disease)   . Migraines   . Paroxysmal SVT (supraventricular tachycardia) ([place])    a. diagnosed in 11/2015.  . Right leg numbness      Social History   Socioeconomic History  . Marital status: Married    Spouse name:  Not on file  . Number of children: 1  . Years of education: 101  . Highest education level: Not on file  Social Needs  . Financial resource strain: Not on file  . Food insecurity - worry: Not on file  . Food insecurity - inability: Not on file  . Transportation needs - medical: Not on file  . Transportation needs - non-medical: Not on file  Occupational History  . Occupation: disability  Tobacco Use  . Smoking status: Never Smoker  . Smokeless tobacco: Never Used  Substance and Sexual Activity  . Alcohol use: No    Alcohol/week: 0.0 oz  . Drug use: No  . Sexual activity: Yes  Other Topics Concern  . Not on file  Social History Narrative   Fun: Watch TV    Past Surgical History:  Procedure Laterality Date  . CESAREAN SECTION    . CYSTOSCOPY N/A 04/05/2016   Procedure: CYSTOSCOPY;  Surgeon: Lavonia Drafts, MD;  Location: [facility];  Service: Gynecology;  Laterality: N/A;  . LAPAROSCOPIC VAGINAL HYSTERECTOMY WITH SALPINGECTOMY Bilateral 04/05/2016   Procedure: LAPAROSCOPIC ASSISTED VAGINAL HYSTERECTOMY WITH SALPINGECTOMY;  Surgeon: Lavonia Drafts, MD;  Location: [facility];  Service: Gynecology;  Laterality: Bilateral;  . LUNG BIOPSY    . VIDEO BRONCHOSCOPY Bilateral 03/29/2016   Procedure: VIDEO BRONCHOSCOPY WITHOUT FLUORO;  Surgeon: Marshell Garfinkel, MD;  Location: [facility];  Service: Cardiopulmonary;  Laterality: Bilateral;    Family History  Problem Relation Age of Onset  . Healthy Mother   .  Healthy Father     No Known Allergies  Current Outpatient Medications on File Prior to Visit  Medication Sig Dispense Refill  . albuterol (VENTOLIN HFA) 108 (90 Base) MCG/ACT inhaler Inhale 2 puffs into the lungs every 6 (six) hours as needed for wheezing or shortness of breath.    . levofloxacin (LEVAQUIN) 500 MG tablet Take 1 tablet (500 mg total) by mouth daily. 10 tablet 0  . metoprolol succinate (TOPROL-XL) 50 MG 24 hr tablet Take 1 tablet (50mg ) in morning and 1/2  tablet (25mg ) in evening with or immediately following a meal. (Patient taking differently: Take 25-50 mg by mouth See admin instructions. 50 mg in the morning and 25 mg in the evening) 135 tablet 1  . omeprazole (PRILOSEC) 40 MG capsule Take 1 capsule (40 mg total) by mouth daily. 30 capsule 2  . Respiratory Therapy Supplies (FLUTTER) DEVI Use as directed. 1 each 0  . RESTASIS 0.05 % ophthalmic emulsion Instill 1 drop into both eyes two times a day  2  . SYMBICORT 160-4.5 MCG/ACT inhaler INHALE 2 PUFFS INTO THE LUNGS 2 (TWO) TIMES DAILY. 10.2 Inhaler 4   No current facility-administered medications on file prior to visit.    BP 120/72 (BP Location: Left Arm, Patient Position: Sitting, Cuff Size: Large)   Pulse 99   Temp 98.5 F (36.9 C) (Oral)   Resp 20   Ht 5\' 6"  (1.676 m)   Wt 182 lb (82.6 kg)   LMP 03/31/2016   SpO2 97%   BMI 29.38 kg/m      Objective:   Physical Exam  Constitutional: She is oriented to person, place, and time. She appears well-developed and well-nourished. No distress.  HENT:  Head: Normocephalic and atraumatic.  Cardiovascular: Normal rate, regular rhythm, normal heart sounds and intact distal pulses.  Pulmonary/Chest: Effort normal and breath sounds normal.  Abdominal: Soft. Normal appearance and bowel sounds are normal. She exhibits no distension, no ascites and no mass. There is no hepatosplenomegaly. There is generalized tenderness. There is no rebound.  Neurological: She is alert and oriented to person, place, and time. Coordination normal.  Skin: Skin is warm and dry.  Mixed acne with comedones, papulopustular lesions, and scarring to entire face.  Psychiatric: She has a normal mood and affect. Judgment and thought content normal.      Assessment & Plan:   Hot flashes Diagnostic testing ordered: - CBC; Future - Comprehensive metabolic panel; Future - TSH; Future Referrals ordered: She has GYN that she has been working with since hysterectomy  with salpingectomy-05/16/16. She will GYN to discuss treatment for hot flashes.

## 2016-12-28 NOTE — Assessment & Plan Note (Signed)
Mderate to severe. Scarring. No response from various OTC products. Referrals ordered: - Ambulatory referral to Dermatology

## 2016-12-29 ENCOUNTER — Ambulatory Visit (INDEPENDENT_AMBULATORY_CARE_PROVIDER_SITE_OTHER): Payer: Medicare HMO | Admitting: Obstetrics & Gynecology

## 2016-12-29 ENCOUNTER — Encounter: Payer: Self-pay | Admitting: Obstetrics & Gynecology

## 2016-12-29 ENCOUNTER — Encounter: Payer: Self-pay | Admitting: Nurse Practitioner

## 2016-12-29 VITALS — BP 114/68 | HR 89 | Wt 181.3 lb

## 2016-12-29 DIAGNOSIS — N951 Menopausal and female climacteric states: Secondary | ICD-10-CM | POA: Diagnosis not present

## 2016-12-29 DIAGNOSIS — R102 Pelvic and perineal pain: Secondary | ICD-10-CM | POA: Diagnosis not present

## 2016-12-29 MED ORDER — ESTRADIOL 0.5 MG PO TABS: 0.5000 mg | ORAL_TABLET | Freq: Every day | ORAL | 1 refills | Status: DC

## 2016-12-29 NOTE — Patient Instructions (Signed)
Menopause Menopause is the normal time of life when menstrual periods stop completely. Menopause is complete when you have missed 12 consecutive menstrual periods. It usually occurs between the ages of 48 years and 55 years. Very rarely does a woman develop menopause before the age of 40 years. At menopause, your ovaries stop producing the female hormones estrogen and progesterone. This can cause undesirable symptoms and also affect your health. Sometimes the symptoms may occur 4-5 years before the menopause begins. There is no relationship between menopause and:  Oral contraceptives.  Number of children you had.  Race.  The age your menstrual periods started (menarche).  Heavy smokers and very thin women may develop menopause earlier in life. What are the causes?  The ovaries stop producing the female hormones estrogen and progesterone. Other causes include:  Surgery to remove both ovaries.  The ovaries stop functioning for no known reason.  Tumors of the pituitary gland in the brain.  Medical disease that affects the ovaries and hormone production.  Radiation treatment to the abdomen or pelvis.  Chemotherapy that affects the ovaries.  What are the signs or symptoms?  Hot flashes.  Night sweats.  Decrease in sex drive.  Vaginal dryness and thinning of the vagina causing painful intercourse.  Dryness of the skin and developing wrinkles.  Headaches.  Tiredness.  Irritability.  Memory problems.  Weight gain.  Bladder infections.  Hair growth of the face and chest.  Infertility. More serious symptoms include:  Loss of bone (osteoporosis) causing breaks (fractures).  Depression.  Hardening and narrowing of the arteries (atherosclerosis) causing heart attacks and strokes.  How is this diagnosed?  When the menstrual periods have stopped for 12 straight months.  Physical exam.  Hormone studies of the blood. How is this treated? There are many treatment  choices and nearly as many questions about them. The decisions to treat or not to treat menopausal changes is an individual choice made with your health care provider. Your health care provider can discuss the treatments with you. Together, you can decide which treatment will work best for you. Your treatment choices may include:  Hormone therapy (estrogen and progesterone).  Non-hormonal medicines.  Treating the individual symptoms with medicine (for example antidepressants for depression).  Herbal medicines that may help specific symptoms.  Counseling by a psychiatrist or psychologist.  Group therapy.  Lifestyle changes including: ? Eating healthy. ? Regular exercise. ? Limiting caffeine and alcohol. ? Stress management and meditation.  No treatment.  Follow these instructions at home:  Take the medicine your health care provider gives you as directed.  Get plenty of sleep and rest.  Exercise regularly.  Eat a diet that contains calcium (good for the bones) and soy products (acts like estrogen hormone).  Avoid alcoholic beverages.  Do not smoke.  If you have hot flashes, dress in layers.  Take supplements, calcium, and vitamin D to strengthen bones.  You can use over-the-counter lubricants or moisturizers for vaginal dryness.  Group therapy is sometimes very helpful.  Acupuncture may be helpful in some cases. Contact a health care provider if:  You are not sure you are in menopause.  You are having menopausal symptoms and need advice and treatment.  You are still having menstrual periods after age 55 years.  You have pain with intercourse.  Menopause is complete (no menstrual period for 12 months) and you develop vaginal bleeding.  You need a referral to a specialist (gynecologist, psychiatrist, or psychologist) for treatment. Get help right   away if:  You have severe depression.  You have excessive vaginal bleeding.  You fell and think you have a  broken bone.  You have pain when you urinate.  You develop leg or chest pain.  You have a fast pounding heart beat (palpitations).  You have severe headaches.  You develop vision problems.  You feel a lump in your breast.  You have abdominal pain or severe indigestion. This information is not intended to replace advice given to you by your health care provider. Make sure you discuss any questions you have with your health care provider. Document Released: 03/19/2003 Document Revised: 06/04/2015 Document Reviewed: 07/26/2012 Elsevier Interactive Patient Education  2017 [employer]vier Inc.  

## 2016-12-29 NOTE — Progress Notes (Signed)
History:  46 y.o. G1P1001 here today for pelvic pain .She reports that the pain is daily but, worse after intercourse. She reports that the pain may last 3 days after intercourse. She also c/o hot flushes and mood changes. She reports that she is moody and his husband is complaining about her being unkind.      The following portions of the patient's history were reviewed and updated as appropriate: allergies, current medications, past family history, past medical history, past social history, past surgical history and problem list.  Review of Systems:  Pertinent items are noted in HPI.   Objective:  Physical Exam Blood pressure 114/68, pulse 89, weight 181 lb 4.8 oz (82.2 kg), last menstrual period 03/31/2016. CONSTITUTIONAL: Well-developed, well-nourished female in no acute distress.  HENT:  Normocephalic, atraumatic EYES: Conjunctivae and EOM are normal. No scleral icterus.  NECK: Normal range of motion Lungs: CTA CV: RRR SKIN: Skin is warm and dry. No rash noted. Not diaphoretic.No pallor. [place]: Alert and oriented to person, place, and time. Normal coordination.  Abd: Soft, diffusely tender; nondistended; no rebound or guarding Pelvic: Normal appearing external genitalia; normal appearing vaginal cuff.  Normal discharge.  There is adnexal tenderness. No masses palpable.    Assessment & Plan:  Pelvic pain in a female- not sure the etiology but, I am concerned that it may be GI in origin.  It is still possible that it is related to scar tissue.  Pelvic US to eval adnexa  Menopausal sx Patient with bothersome menopausal vasomotor symptoms. Discussed lifestyle interventions such as wearing light clothing, remaining in cool environments, having fan/air conditioner in the room, avoiding hot beverages etc.  Discussed using hormone therapy and concerns about increased risk of heart disease, cerebrovascular disease, thromboembolic disease,  and breast cancer.  Also discussed other  medical options such as Paxil, Effexor or Neurontin.   Also discussed alternative therapies such as herbal remedies but cautioned that most of the products contained phytoestrogens (plant estrogens) in unregulated amounts which can have the same effects on the body as the pharmaceutical estrogen preparations.  Also referred her to www.menopause.org for other alternative options.  Patient opted for Estrace estrogen therapy for now, wants to try the oral formulation. Estrace .5mg  prescribed. May increase to 1 mg if no alleviation of symptoms. She will return in 6 weeks for reevaluation.  Total face-to-face time with patient was 20 min.  Greater than 50% was spent in counseling and coordination of care with the patient.   Shadi Larner L. Harraway-Smith, M.D., [facility]

## 2017-01-04 ENCOUNTER — Ambulatory Visit (HOSPITAL_COMMUNITY)
Admission: RE | Admit: 2017-01-04 | Discharge: 2017-01-04 | Disposition: A | Discharge status: Home / Self Care | Payer: Medicare HMO | Source: Ambulatory Visit | Attending: Obstetrics & Gynecology | Admitting: Obstetrics & Gynecology

## 2017-01-04 DIAGNOSIS — Z90710 Acquired absence of both cervix and uterus: Secondary | ICD-10-CM | POA: Diagnosis not present

## 2017-01-04 DIAGNOSIS — R102 Pelvic and perineal pain: Secondary | ICD-10-CM | POA: Insufficient documentation

## 2017-01-04 IMAGING — US US TRANSVAGINAL NON-OB
1 series · 15 of 25 positions shown · non-contrast
Comparison: [DATE]

CLINICAL DATA: Pelvic pain in a female, prior hysterectomy and
salpingectomy, lower abdominal pain for 1 month worse with
intercourse

EXAM:
TRANSABDOMINAL AND TRANSVAGINAL ULTRASOUND OF PELVIS
TECHNIQUE: Both transabdominal and transvaginal ultrasound examinations of the
pelvis were performed. Transabdominal technique was performed for
global imaging of the pelvis including uterus, ovaries, adnexal
regions, and pelvic cul-de-sac. It was necessary to proceed with
endovaginal exam following the transabdominal exam to visualize the
ovaries and adnexa.

[Series 1: us transvaginal non-ob · 15 of 41 slices shown]
[im 1/41]
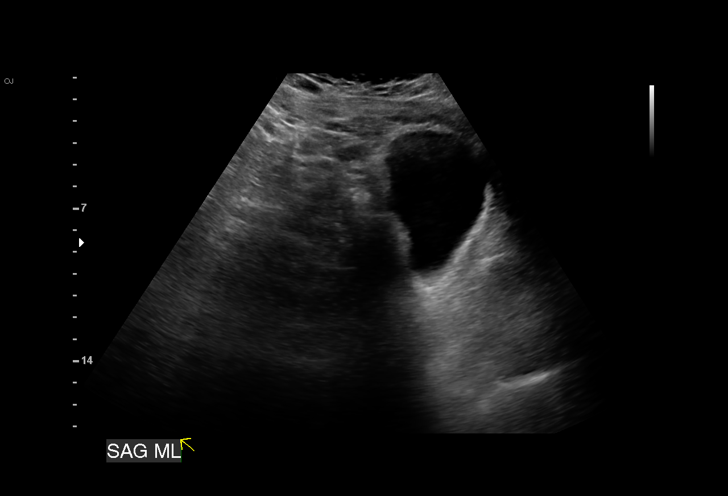
[im 4/41]
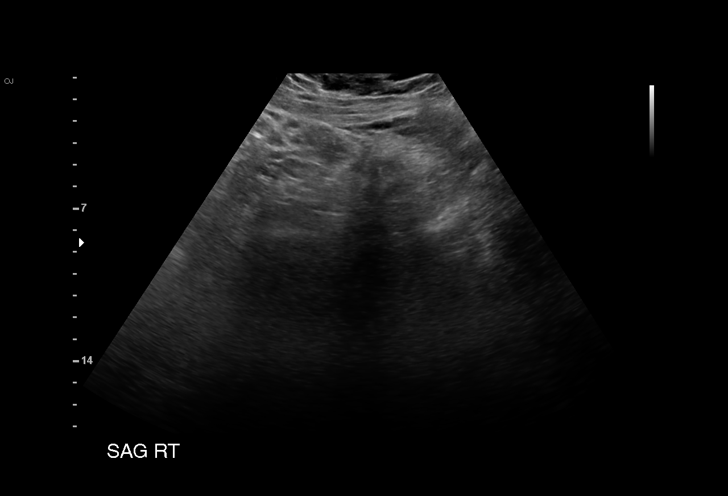
[im 7/41]
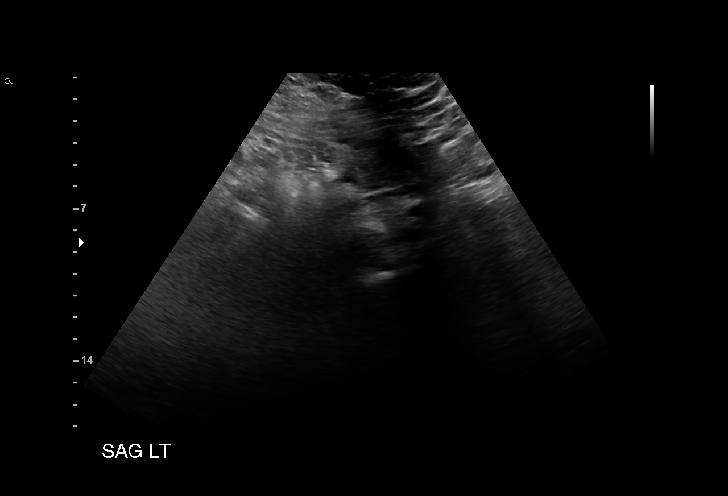
[im 9/41]
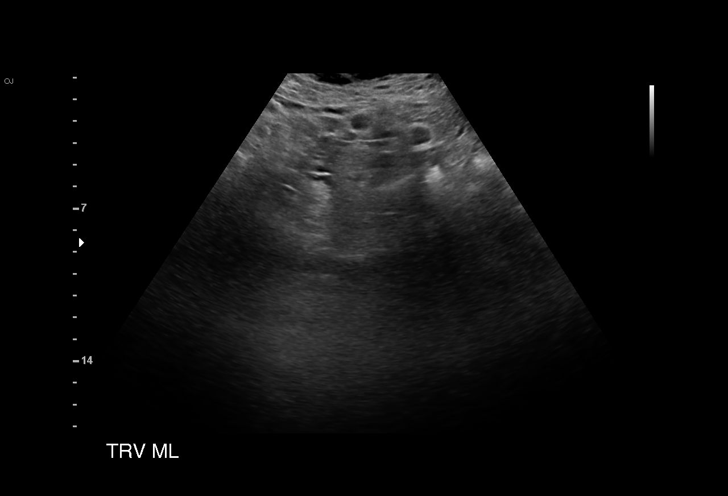
[im 12/41]
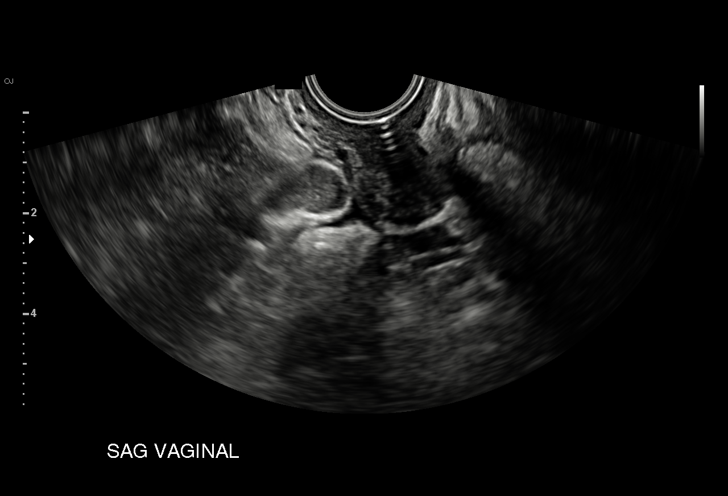
[im 16/41]
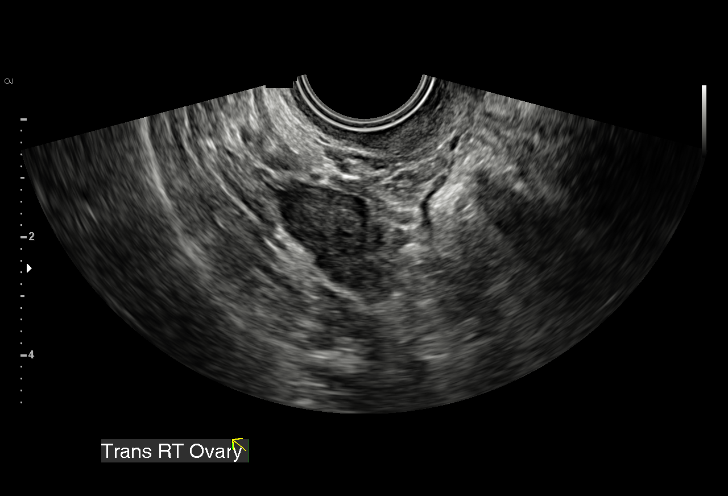
[im 17/41]
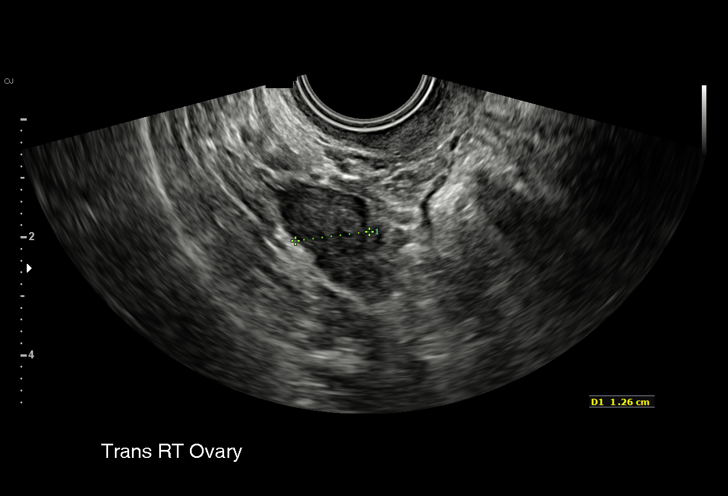
[im 21/41]
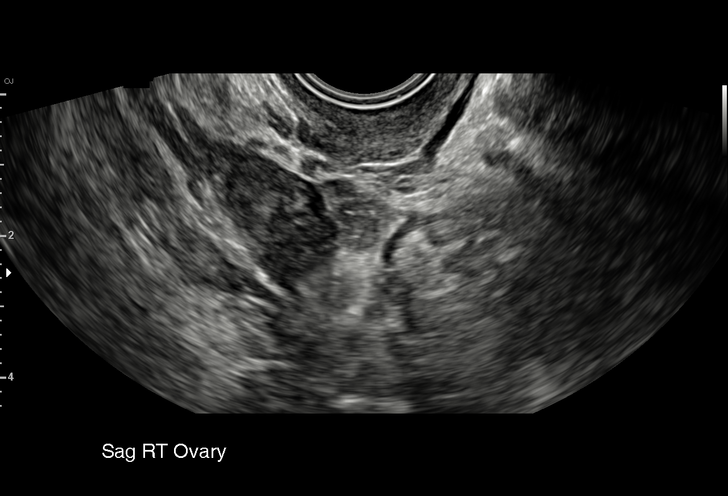
[im 24/41]
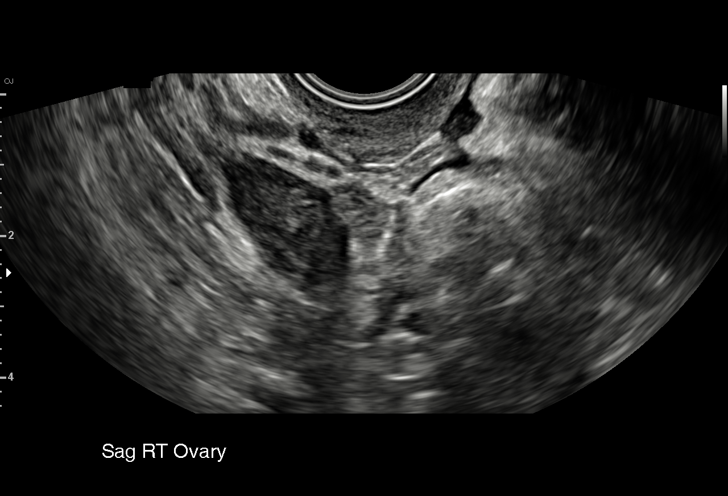
[im 26/41]
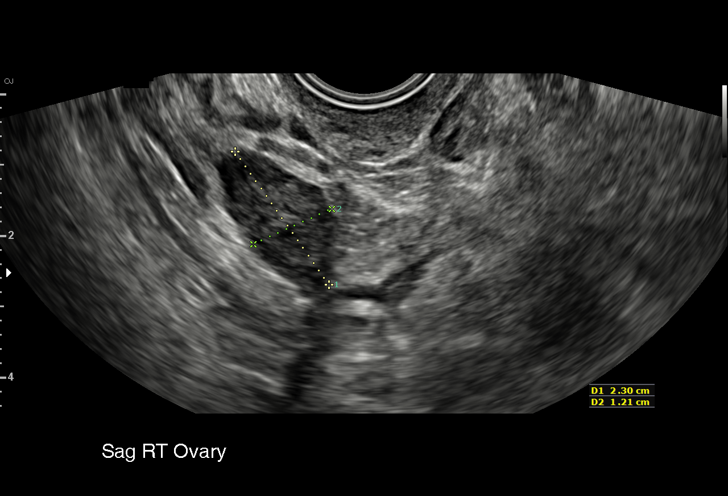
[im 29/41]
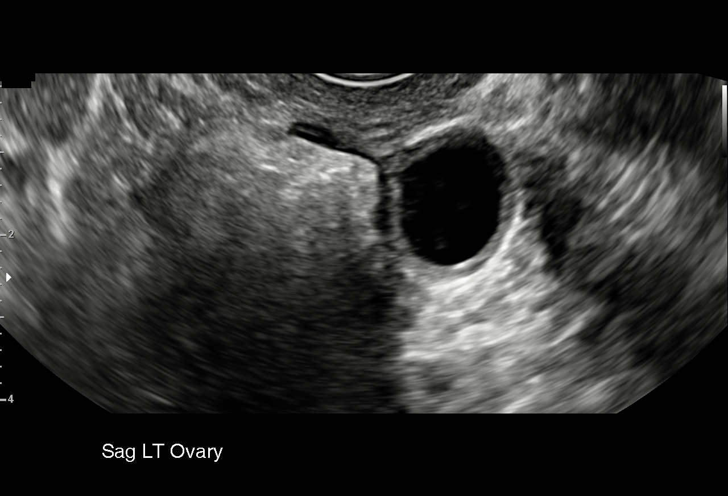
[im 32/41]
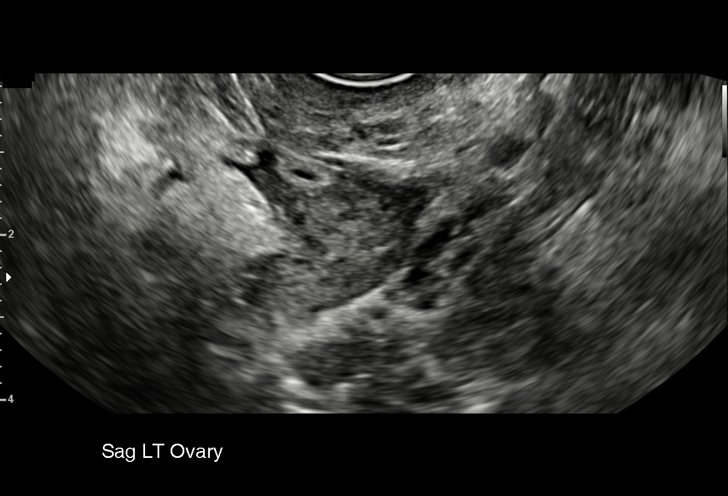
[im 34/41]
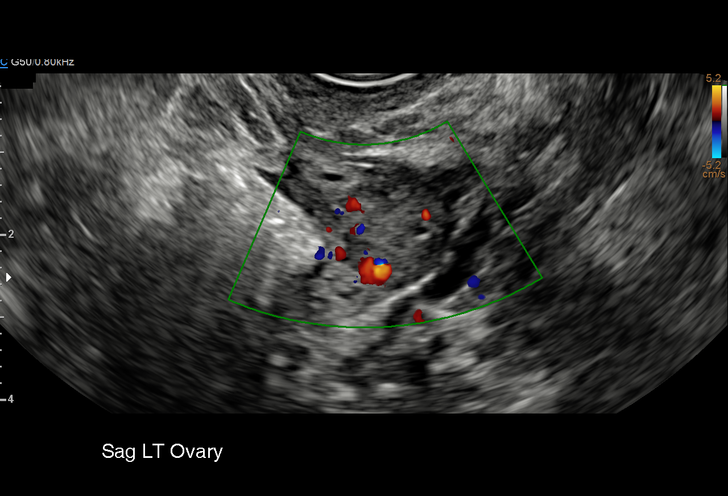
[im 37/41]
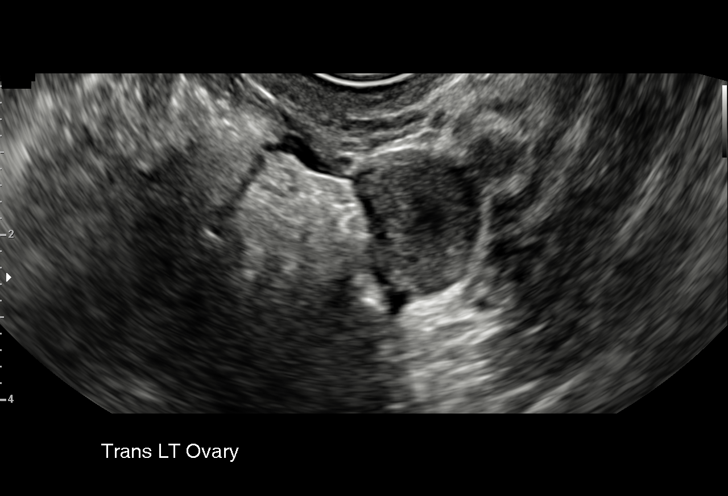
[im 41/41]
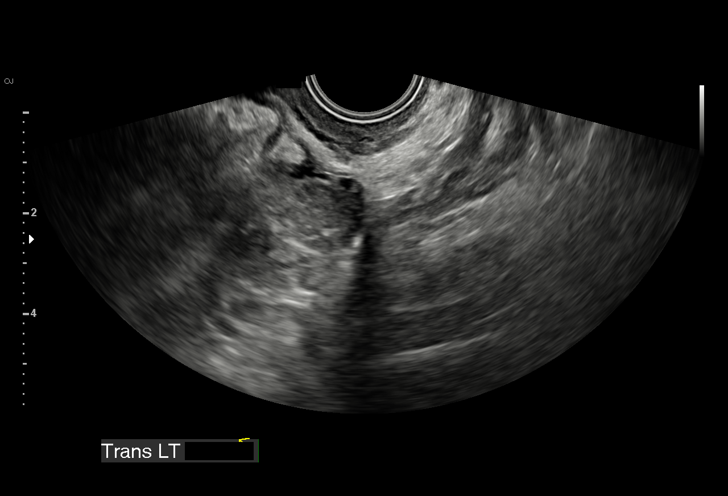

[15 of 25 positions shown; findings below may reference images not displayed]

FINDINGS: Uterus

Surgically absent

Endometrium

N/A

Right ovary

Measurements: 2.3 x 1.2 x 1.3 cm. Normal morphology without mass

Left ovary

Measurements: 2.3 x 1.5 x 1.5 cm. Dominant follicle 1.4 cm diameter.
No additional mass.

Other findings

No free pelvic fluid or adnexal masses.
IMPRESSION: Post hysterectomy.

Otherwise normal exam.

## 2017-01-17 ENCOUNTER — Ambulatory Visit: Payer: Medicare HMO | Admitting: Nurse Practitioner

## 2017-01-17 VITALS — BP 108/66 | HR 88 | Ht 67.0 in | Wt 180.8 lb

## 2017-01-17 DIAGNOSIS — K2100 Gastro-esophageal reflux disease with esophagitis, without bleeding: Secondary | ICD-10-CM

## 2017-01-17 DIAGNOSIS — R079 Chest pain, unspecified: Secondary | ICD-10-CM

## 2017-01-17 DIAGNOSIS — R1013 Epigastric pain: Secondary | ICD-10-CM | POA: Diagnosis not present

## 2017-01-17 MED ORDER — ESOMEPRAZOLE MAGNESIUM 20 MG PO CPDR: 20.0000 mg | DELAYED_RELEASE_CAPSULE | Freq: Two times a day (BID) | ORAL | 2 refills | Status: DC

## 2017-01-17 MED ORDER — DICYCLOMINE HCL 20 MG PO TABS: 20.0000 mg | ORAL_TABLET | Freq: Every day | ORAL | 0 refills | Status: DC | PRN

## 2017-01-17 NOTE — Progress Notes (Signed)
Chief Complaint:  GERD  HPI: Patient is a 47 year old female from [place] referred by PCP Caesar Chestnut, NP for GERD with epigastric / chest pain and regurgitation.  She has SVT, followed by Cardiology. She has never smoked by has emphysema and is followed by Pulmonary. Patient had similar symptoms GI sx in 2012 in [place], saw GI, sounds like she had an EGD but can't remember name of what was found. She was prescribed Dicyclomine which helped. She took Dicyclomine for a year or so then was able to come off from it with any recurrent sx until about 5 weeks ago. Having subxyphoid pain which doesn't radiate through to her back but may spread across upper abdomen and into her chest.  No nausea but feels full and doesn't want to eat.  She frequently regurgitates food and bitter fluid into her mouth. Says she started a PPI several weeks ago.  helps some but not near as much as the dicyclomine did in 2012. She doesn't understand why the sx have recurred. No dietary changes. She has gained weight gradually over the last year. No dysphagia. Recent LFTs / CBC normal. She has had a lap hysterctomy. Gallbladder is intact.   O[place] mentions that her stool has been black. No bismuth use or iron. No nausea / vomiting but her appetite is not good. Weight is stable. .     Past Medical History:  Diagnosis Date  . Anemia   . Bronchiectasis ([place])   . COPD (chronic obstructive pulmonary disease) ([place])   . GERD (gastroesophageal reflux disease)   . Migraines   . Paroxysmal SVT (supraventricular tachycardia) ([place])    a. diagnosed in 11/2015.  . Right leg numbness      Past Surgical History:  Procedure Laterality Date  . CESAREAN SECTION    . CYSTOSCOPY N/A 04/05/2016   Procedure: CYSTOSCOPY;  Surgeon: Lavonia Drafts, MD;  Location: [facility];  Service: Gynecology;  Laterality: N/A;  . LAPAROSCOPIC VAGINAL HYSTERECTOMY WITH SALPINGECTOMY Bilateral 04/05/2016   Procedure: LAPAROSCOPIC ASSISTED  VAGINAL HYSTERECTOMY WITH SALPINGECTOMY;  Surgeon: Lavonia Drafts, MD;  Location: [facility];  Service: Gynecology;  Laterality: Bilateral;  . LUNG BIOPSY    . VIDEO BRONCHOSCOPY Bilateral 03/29/2016   Procedure: VIDEO BRONCHOSCOPY WITHOUT FLUORO;  Surgeon: Marshell Garfinkel, MD;  Location: [facility];  Service: Cardiopulmonary;  Laterality: Bilateral;   Family History  Problem Relation Age of Onset  . Healthy Mother   . Healthy Father    Social History   Tobacco Use  . Smoking status: Never Smoker  . Smokeless tobacco: Never Used  Substance Use Topics  . Alcohol use: No    Alcohol/week: 0.0 oz  . Drug use: No   Current Outpatient Medications  Medication Sig Dispense Refill  . albuterol (VENTOLIN HFA) 108 (90 Base) MCG/ACT inhaler Inhale 2 puffs into the lungs every 6 (six) hours as needed for wheezing or shortness of breath. 3 Inhaler 3  . esomeprazole (NEXIUM) 20 MG capsule Take 1 capsule (20 mg total) by mouth daily at 12 noon. 90 capsule 0  . estradiol (ESTRACE) 0.5 MG tablet Take 1 tablet (0.5 mg total) by mouth daily. 30 tablet 1  . levofloxacin (LEVAQUIN) 500 MG tablet Take 1 tablet (500 mg total) by mouth daily. 10 tablet 0  . metoprolol succinate (TOPROL-XL) 50 MG 24 hr tablet Take 1 tablet (50mg ) in morning and 1/2 tablet (25mg ) in evening with or immediately following a meal. (Patient taking differently: Take 25-50 mg by  mouth See admin instructions. 50 mg in the morning and 25 mg in the evening) 135 tablet 1  . Respiratory Therapy Supplies (FLUTTER) DEVI Use as directed. 1 each 0  . RESTASIS 0.05 % ophthalmic emulsion Instill 1 drop into both eyes two times a day  2  . SYMBICORT 160-4.5 MCG/ACT inhaler INHALE 2 PUFFS INTO THE LUNGS 2 (TWO) TIMES DAILY. 10.2 Inhaler 4   No current facility-administered medications for this visit.    No Known Allergies   Review of Systems: Positive for allergy, sinus from recent hemoptysis, headaches, shortness of breath, sleeping  problems and voice changes . All others systems reviewed and negative except where noted in HPI.    Physical Exam: BP 108/66   Pulse 88   Ht 5\' 7"  (1.702 m)   Wt 180 lb 12.8 oz (82 kg)   LMP 03/31/2016   BMI 28.32 kg/m  Constitutional:  Well-developed, black female in no acute distress. Psychiatric: Normal mood and affect. Behavior is normal. EENT: Pupils normal.  Conjunctivae are normal. No scleral icterus. Neck supple.  Cardiovascular: Normal rate, regular rhythm. No edema Pulmonary/chest: Effort normal and breath sounds normal. No wheezing, rales or rhonchi. Abdominal: Soft, nondistended. Nontender. Bowel sounds active throughout. There are no masses palpable. No hepatomegaly. Lymphadenopathy: No cervical adenopathy noted. Neurological: Alert and oriented to person place and time. Skin: Skin is warm and dry. No rashes noted.   ASSESSMENT AND PLAN:  1. 47 yo female with epigastric / chest pain / regurgitation. PPI started several weeks ago, some improvement but still quite symptomatic. She has bronchiectasis and emphysema (though never smoked) and is seeing Pulmonary. Recent CTA of chest for chest pain / hemoptysis was negative.  Her chest pain maybe multifactorial but with the regurgitation there does seem to be a component of GERD. Response to PPI has been suboptimal -For further evaluation patient will be scheduled for EGD. The risks and benefits of EGD were discussed and the patient agrees to proceed -Recommended a wedge pillow. .  -Patient wants to retry Dicyclomine which was very helpful for similar problems back in 2012. Will send in Rx -Continue daily Omeprazole. If adding dicyclomine doesn't help then increase Omeprazole to BID for now. -If sx persists, EGD negative consider abdominal imaging. Recent LFTs normal.     2. Stool color change. Describes black stool. On exam stool light brown, heme negative. Could of ingested some blood recently given complaints of hemoptysis.  No evidence for active GI bleeding, -will check a CBC.   Marland Kitchen Tye Savoy, NP  01/17/2017, 2:01 PM  Cc: Lance Sell, NP

## 2017-01-17 NOTE — Patient Instructions (Addendum)
If you are age 47 or older, your body mass index should be between 23-30. Your Body mass index is 28.32 kg/m. If this is out of the aforementioned range listed, please consider follow up with your Primary Care Provider.  If you are age 61 or younger, your body mass index should be between 19-25. Your Body mass index is 28.32 kg/m. If this is out of the aformentioned range listed, please consider follow up with your Primary Care Provider.   You have been scheduled for an endoscopy. Please follow written instructions given to you at your visit today. If you use inhalers (even only as needed), please bring them with you on the day of your procedure. Your physician has requested that you go to www.startemmi.com and enter the access code given to you at your visit today. This web site gives a general overview about your procedure. However, you should still follow specific instructions given to you by our office regarding your preparation for the procedure.  We have sent the following medications to your pharmacy for you to pick up at your convenience:  Dicyclomine Nexium  In no improvement, increase Nexium to twice daily.  You have been given GERD Literature. Try a wedge pillow at night.  Thank you for choosing me and [facility].   Tye Savoy, NP

## 2017-01-19 ENCOUNTER — Encounter: Payer: Self-pay | Admitting: Nurse Practitioner

## 2017-01-19 ENCOUNTER — Encounter: Payer: Self-pay | Admitting: Gastroenterology

## 2017-01-19 ENCOUNTER — Ambulatory Visit (AMBULATORY_SURGERY_CENTER): Payer: Medicare HMO | Admitting: Gastroenterology

## 2017-01-19 VITALS — BP 128/67 | HR 71 | Temp 96.8°F | Resp 10 | Ht 67.0 in | Wt 180.0 lb

## 2017-01-19 DIAGNOSIS — R109 Unspecified abdominal pain: Secondary | ICD-10-CM | POA: Diagnosis not present

## 2017-01-19 DIAGNOSIS — R079 Chest pain, unspecified: Secondary | ICD-10-CM

## 2017-01-19 DIAGNOSIS — G8929 Other chronic pain: Secondary | ICD-10-CM

## 2017-01-19 DIAGNOSIS — K219 Gastro-esophageal reflux disease without esophagitis: Secondary | ICD-10-CM | POA: Diagnosis not present

## 2017-01-19 DIAGNOSIS — R1013 Epigastric pain: Secondary | ICD-10-CM | POA: Diagnosis not present

## 2017-01-19 MED ORDER — SODIUM CHLORIDE 0.9 % IV SOLN: 500.0000 mL | Freq: Once | INTRAVENOUS | Status: DC

## 2017-01-19 NOTE — Patient Instructions (Signed)
YOU HAD AN ENDOSCOPIC PROCEDURE TODAY: Refer to the procedure report and other information in the discharge instructions given to you for any specific questions about what was found during the examination. If this information does not answer your questions, please call [facility] office at [phone] to clarify.   YOU SHOULD EXPECT: Some feelings of bloating in the abdomen. Passage of more gas than usual. Walking can help get rid of the air that was put into your GI tract during the procedure and reduce the bloating. If you had a lower endoscopy (such as a colonoscopy or flexible sigmoidoscopy) you may notice spotting of blood in your stool or on the toilet paper. Some abdominal soreness may be present for a day or two, also.  DIET: Your first meal following the procedure should be a light meal and then it is ok to progress to your normal diet. A half-sandwich or bowl of soup is an example of a good first meal. Heavy or fried foods are harder to digest and may make you feel nauseous or bloated. Drink plenty of fluids but you should avoid alcoholic beverages for 24 hours. If you had a esophageal dilation, please see attached instructions for diet.    ACTIVITY: Your care partner should take you home directly after the procedure. You should plan to take it easy, moving slowly for the rest of the day. You can resume normal activity the day after the procedure however YOU SHOULD NOT DRIVE, use power tools, machinery or perform tasks that involve climbing or major physical exertion for 24 hours (because of the sedation medicines used during the test).   SYMPTOMS TO REPORT IMMEDIATELY: A gastroenterologist can be reached at any hour. Please call [phone]  for any of the following symptoms:   Following upper endoscopy (EGD, EUS, ERCP, esophageal dilation) Vomiting of blood or coffee ground material  New, significant abdominal pain  New, significant chest pain or pain under the shoulder blades  Painful or  persistently difficult swallowing  New shortness of breath  Black, tarry-looking or red, bloody stools  FOLLOW UP:  If any biopsies were taken you will be contacted by phone or by letter within the next 1-3 weeks. Call [phone]  if you have not heard about the biopsies in 3 weeks.  Please also call with any specific questions about appointments or follow up tests.  

## 2017-01-19 NOTE — Progress Notes (Signed)
Pt's states no medical or surgical changes since previsit or office visit. 

## 2017-01-19 NOTE — Progress Notes (Signed)
To PACU, VSS. Report to [email or web address] 

## 2017-01-19 NOTE — Op Note (Signed)
[place] Patient Name: Hannah Barron Procedure Date: 01/19/2017 9:54 AM MRN: [id number] Endoscopist: Spencer. Loletha Carrow , MD Age: 47 Referring MD:  Date of Birth: [birth year] Gender: Female Account #: [id number] Procedure:                Upper GI endoscopy Indications:              Epigastric abdominal pain, Suspected                            gastro-esophageal reflux disease Medicines:                Monitored Anesthesia Care Procedure:                Pre-Anesthesia Assessment:                           - Prior to the procedure, a History and Physical                            was performed, and patient medications and                            allergies were reviewed. The patient's tolerance of                            previous anesthesia was also reviewed. The risks                            and benefits of the procedure and the sedation                            options and risks were discussed with the patient.                            All questions were answered, and informed consent                            was obtained. Prior Anticoagulants: The patient has                            taken no previous anticoagulant or antiplatelet                            agents. ASA Grade Assessment: II - A patient with                            mild systemic disease. After reviewing the risks                            and benefits, the patient was deemed in                            satisfactory condition to undergo the procedure.  After obtaining informed consent, the endoscope was                            passed under direct vision. Throughout the                            procedure, the patient's blood pressure, pulse, and                            oxygen saturations were monitored continuously. The                            Endoscope was introduced through the mouth, and                            advanced to the second part of duodenum.  The upper                            GI endoscopy was accomplished without difficulty.                            The patient tolerated the procedure well. Scope In: Scope Out: Findings:                 The larynx was normal.                           The esophagus was normal.                           The stomach was normal.                           The cardia and gastric fundus were normal on                            retroflexion.                           The examined duodenum was normal. Complications:            No immediate complications. Estimated Blood Loss:     Estimated blood loss: none. Impression:               - Normal larynx.                           - Normal esophagus.                           - Normal stomach.                           - Normal examined duodenum.                           - No specimens collected. Recommendation:           - Patient has a contact  number available for                            emergencies. The signs and symptoms of potential                            delayed complications were discussed with the                            patient. Return to normal activities tomorrow.                            Written discharge instructions were provided to the                            patient.                           - Resume previous diet.                           - Continue present medications.                           - Perform a RUQ ultrasound at appointment to be                            scheduled. Henry L. Loletha Carrow, MD 01/19/2017 10:12:54 AM This report has been signed electronically.

## 2017-01-20 ENCOUNTER — Other Ambulatory Visit: Payer: Self-pay

## 2017-01-20 ENCOUNTER — Telehealth: Payer: Self-pay

## 2017-01-20 DIAGNOSIS — R1013 Epigastric pain: Secondary | ICD-10-CM

## 2017-01-20 NOTE — Telephone Encounter (Signed)
Spoke to patient and she is aware of scheduled [place] at [facility] on 1/15 arrive at 8:45 for 9:00, NPO after midnight.

## 2017-01-20 NOTE — Progress Notes (Signed)
Thank you for sending this case to me. I have reviewed the entire note, and the outlined plan seems appropriate.  I reviewed this note yesterday before performing EGD.  U/S ordered.  Wilfrid Lund, MD

## 2017-01-20 NOTE — Telephone Encounter (Signed)
  Follow up Call-  Call back number 01/19/2017  Post procedure Call Back phone  # [phone]  Permission to leave phone message Yes     Patient questions:  Do you have a fever, pain , or abdominal swelling? No. Pain Score  0 *  Have you tolerated food without any problems? Yes  Have you been able to return to your normal activities? Yes  Do you have any questions about your discharge instructions: Diet   No. Medications  No. Follow up visit  No.  Do you have questions or concerns about your Care? No.  Actions: * If pain score is 4 or above: No action needed, pain <4.

## 2017-01-23 ENCOUNTER — Telehealth: Payer: Self-pay | Admitting: General Practice

## 2017-01-23 NOTE — Telephone Encounter (Signed)
-----   Message from Lavonia Drafts, MD sent at 01/13/2017  1:31 AM EST ----- Please call pt. Her [place] was neg.  She needs a referral to GI. She does not have MyChart.   Thx, clh-S

## 2017-01-23 NOTE — Telephone Encounter (Signed)
Per chart review, patient has already seen GI earlier this month. Called patient and informed her of results. Patient verbalized understanding & had no questions

## 2017-01-24 ENCOUNTER — Ambulatory Visit (HOSPITAL_COMMUNITY)
Admission: RE | Admit: 2017-01-24 | Discharge: 2017-01-24 | Disposition: A | Discharge status: Home / Self Care | Payer: Medicare HMO | Source: Ambulatory Visit | Attending: Gastroenterology | Admitting: Gastroenterology

## 2017-01-24 DIAGNOSIS — K760 Fatty (change of) liver, not elsewhere classified: Secondary | ICD-10-CM | POA: Insufficient documentation

## 2017-01-24 DIAGNOSIS — R1013 Epigastric pain: Secondary | ICD-10-CM | POA: Diagnosis not present

## 2017-01-24 DIAGNOSIS — R932 Abnormal findings on diagnostic imaging of liver and biliary tract: Secondary | ICD-10-CM | POA: Diagnosis not present

## 2017-01-26 ENCOUNTER — Ambulatory Visit (INDEPENDENT_AMBULATORY_CARE_PROVIDER_SITE_OTHER): Payer: Medicare HMO | Admitting: Nurse Practitioner

## 2017-01-26 ENCOUNTER — Encounter: Payer: Self-pay | Admitting: Nurse Practitioner

## 2017-01-26 ENCOUNTER — Ambulatory Visit: Payer: Self-pay | Admitting: Pulmonary Disease

## 2017-01-26 VITALS — BP 110/72 | HR 71 | Temp 98.8°F | Resp 16 | Ht 67.0 in | Wt 180.0 lb

## 2017-01-26 DIAGNOSIS — R519 Headache, unspecified: Secondary | ICD-10-CM

## 2017-01-26 DIAGNOSIS — K2100 Gastro-esophageal reflux disease with esophagitis, without bleeding: Secondary | ICD-10-CM

## 2017-01-26 DIAGNOSIS — R51 Headache: Secondary | ICD-10-CM | POA: Diagnosis not present

## 2017-01-26 DIAGNOSIS — R232 Flushing: Secondary | ICD-10-CM | POA: Diagnosis not present

## 2017-01-26 MED ORDER — IBUPROFEN 400 MG PO TABS: 400.0000 mg | ORAL_TABLET | Freq: Four times a day (QID) | ORAL | 0 refills | Status: DC | PRN

## 2017-01-26 NOTE — Progress Notes (Signed)
Name: Hannah Barron   MRN: [id number]    DOB: 1970/05/05   Date:01/26/2017       Progress Note  Subjective  Chief Complaint Follow up, ibuprofen Rx for headache  HPI  Hannah Barron presents today for a follow up visit today. She was last seen on 12/19 with complaints of hot flashes, decreased appetite, and skin problem. She had a normal TSH, CMET, and CBC drawn that day. She was started on nexium once daily. She was referred to gynecology for the hot flashes, dermatology for the skin problem, and gastroenterology for her weight loss. She has since been to gynecology- transvaginal ultrasound ordered and estradiol started- and gastroenterology-with endoscopy ordered and started on dicyclomine, continued on nexium.  Since her visit on 12/19, she says her symptoms have much improved. She still does not have a great appetite but has been eating a little better and her regurgitation, belching and heartburn are gone. She has also had improvement in her hot flashes. She does report mild intermittent headaches- describes as dull headaches that have been occurring about one day a month - for at least a year. She notices the headaches mostly when she is tired. She is able to take ibuprofen with relief and would like a prescription for ibuprofen today. She denies weakness, syncope, confusion,  weight loss, vision changes, nausea, vomiting, constipation, diarrhea, pale skin, jaundice.   Patient Active Problem List   Diagnosis Date Noted  . Acne vulgaris 12/28/2016  . Bronchiectasis with acute exacerbation ([place]) 12/22/2016  . Medicare annual wellness visit, subsequent 04/28/2016  . Routine adult health maintenance 04/28/2016  . Fibroids, intramural 04/01/2016  . Abnormal uterine bleeding (AUB) 04/01/2016  . Lung nodules 03/16/2016  . Palpitations   . GERD (gastroesophageal reflux disease) 07/30/2015  . Bronchiectasis without acute exacerbation ([place]) 07/08/2015  . COPD (chronic obstructive pulmonary disease) ([place])  06/04/2015  . Fatigue 06/04/2015    Past Surgical History:  Procedure Laterality Date  . CESAREAN SECTION    . CYSTOSCOPY N/A 04/05/2016   Procedure: CYSTOSCOPY;  Surgeon: Lavonia Drafts, MD;  Location: [facility];  Service: Gynecology;  Laterality: N/A;  . LAPAROSCOPIC VAGINAL HYSTERECTOMY WITH SALPINGECTOMY Bilateral 04/05/2016   Procedure: LAPAROSCOPIC ASSISTED VAGINAL HYSTERECTOMY WITH SALPINGECTOMY;  Surgeon: Lavonia Drafts, MD;  Location: [facility];  Service: Gynecology;  Laterality: Bilateral;  . LUNG BIOPSY    . VIDEO BRONCHOSCOPY Bilateral 03/29/2016   Procedure: VIDEO BRONCHOSCOPY WITHOUT FLUORO;  Surgeon: Marshell Garfinkel, MD;  Location: [facility];  Service: Cardiopulmonary;  Laterality: Bilateral;    Family History  Problem Relation Age of Onset  . Healthy Mother   . Healthy Father     Social History   Socioeconomic History  . Marital status: Married    Spouse name: Not on file  . Number of children: 1  . Years of education: 60  . Highest education level: Not on file  Social Needs  . Financial resource strain: Not on file  . Food insecurity - worry: Not on file  . Food insecurity - inability: Not on file  . Transportation needs - medical: Not on file  . Transportation needs - non-medical: Not on file  Occupational History  . Occupation: disability  Tobacco Use  . Smoking status: Never Smoker  . Smokeless tobacco: Never Used  Substance and Sexual Activity  . Alcohol use: No    Alcohol/week: 0.0 oz  . Drug use: No  . Sexual activity: Yes  Other Topics Concern  . Not on file  Social History Narrative   Fun: Watch TV     Current Outpatient Medications:  .  albuterol (VENTOLIN HFA) 108 (90 Base) MCG/ACT inhaler, Inhale 2 puffs into the lungs every 6 (six) hours as needed for wheezing or shortness of breath., Disp: 3 Inhaler, Rfl: 3 .  dicyclomine (BENTYL) 20 MG tablet, Take 1 tablet (20 mg total) by mouth daily as needed for spasms., Disp: 30  tablet, Rfl: 0 .  esomeprazole (NEXIUM) 20 MG capsule, Take 1 capsule (20 mg total) by mouth 2 (two) times daily. 30 minutes before meals, Disp: 60 capsule, Rfl: 2 .  estradiol (ESTRACE) 0.5 MG tablet, Take 1 tablet (0.5 mg total) by mouth daily., Disp: 30 tablet, Rfl: 1 .  metoprolol succinate (TOPROL-XL) 50 MG 24 hr tablet, Take 1 tablet (50mg ) in morning and 1/2 tablet (25mg ) in evening with or immediately following a meal. (Patient taking differently: Take 25-50 mg by mouth See admin instructions. 50 mg in the morning and 25 mg in the evening), Disp: 135 tablet, Rfl: 1 .  Respiratory Therapy Supplies (FLUTTER) DEVI, Use as directed., Disp: 1 each, Rfl: 0 .  RESTASIS 0.05 % ophthalmic emulsion, Instill 1 drop into both eyes two times a day, Disp: , Rfl: 2 .  SYMBICORT 160-4.5 MCG/ACT inhaler, INHALE 2 PUFFS INTO THE LUNGS 2 (TWO) TIMES DAILY., Disp: 10.2 Inhaler, Rfl: 4 .  ibuprofen (ADVIL,MOTRIN) 400 MG tablet, Take 1 tablet (400 mg total) by mouth every 6 (six) hours as needed., Disp: 30 tablet, Rfl: 0  Current Facility-Administered Medications:  .  0.9 %  sodium chloride infusion, 500 mL, Intravenous, Once, Danis, Estill Cotta III, MD  No Known Allergies   ROS See HPI  Objective  Vitals:   01/26/17 1256  BP: 110/72  Pulse: 71  Resp: 16  Temp: 98.8 F (37.1 C)  TempSrc: Oral  SpO2: 96%  Weight: 180 lb (81.6 kg)  Height: 5\' 7"  (1.702 m)   Body mass index is 28.19 kg/m.  Physical Exam  Vital signs reviewed. Constitutional: Patient appears well-developed and well-nourished. No distress.  HENT: Head: Normocephalic and atraumatic. Ears: B TMs ok, no erythema or effusion; Nose: Nose normal. Mouth/Throat: Oropharynx is clear and moist. No oropharyngeal exudate.  Eyes: Conjunctivae and EOM are normal. Pupils are equal, round, and reactive to light. No scleral icterus.  Neck: Normal range of motion. Neck supple. Cardiovascular: Normal rate, regular rhythm and normal heart sounds.  No  murmur heard. No BLE edema. Pulmonary/Chest: Effort normal and breath sounds normal. No respiratory distress. Musculoskeletal: Normal range of motion. No gross deformities Neurological: she is alert and oriented to person, place, and time. No cranial nerve deficit. Coordination, balance, strength, speech and gait are normal.  Skin: Skin is warm and dry. No rash noted. Psychiatric: Patient has a normal mood and affect. behavior is normal. Judgment and thought content normal.  Recent Results (from the past 2160 hour(s))  TSH     Status: None   Collection Time: 12/28/16  3:10 PM  Result Value Ref Range   TSH 0.92 0.35 - 4.50 uIU/mL  Comprehensive metabolic panel     Status: None   Collection Time: 12/28/16  3:10 PM  Result Value Ref Range   Sodium 138 135 - 145 mEq/L   Potassium 4.0 3.5 - 5.1 mEq/L   Chloride 104 96 - 112 mEq/L   CO2 27 19 - 32 mEq/L   Glucose, Bld 87 70 - 99 mg/dL   BUN 11 6 -  23 mg/dL   Creatinine, Ser 0.74 0.40 - 1.20 mg/dL   Total Bilirubin 0.2 0.2 - 1.2 mg/dL   Alkaline Phosphatase 68 39 - 117 U/L   AST 15 0 - 37 U/L   ALT 13 0 - 35 U/L   Total Protein 7.7 6.0 - 8.3 g/dL   Albumin 3.9 3.5 - 5.2 g/dL   Calcium 9.1 8.4 - 10.5 mg/dL   GFR 89.49 >60.00 mL/min  CBC     Status: None   Collection Time: 12/28/16  3:10 PM  Result Value Ref Range   WBC 6.8 4.0 - 10.5 K/uL   RBC 3.95 3.87 - 5.11 Mil/uL   Platelets 286.0 150.0 - 400.0 K/uL   Hemoglobin 12.5 12.0 - 15.0 g/dL   HCT 38.1 36.0 - 46.0 %   MCV 96.4 78.0 - 100.0 fl   MCHC 32.9 30.0 - 36.0 g/dL   RDW 13.2 11.5 - 15.5 %     Assessment & Plan RTC for CPE or as needed  Nonintractable episodic headache, unspecified headache type No alarming findings on history or PE today. She will let me know if headaches worsen. - ibuprofen (ADVIL,MOTRIN) 400 MG tablet; Take 1 tablet (400 mg total) by mouth every 6 (six) hours as needed.  Dispense: 30 tablet; Refill: 0  Hot flashes Improved. Continue f/u with  GYN

## 2017-01-26 NOTE — Patient Instructions (Addendum)
I have sent a prescription for ibuprofen to your pharmacy for headaches as needed.  Please let me know if your headaches arent relieved with ibuprofen or become more frequent.  I will see you back around April for your annual physical, or sooner if you need me.

## 2017-01-26 NOTE — Assessment & Plan Note (Signed)
Improved with PPI, continue. Continue f/u with GI

## 2017-01-30 ENCOUNTER — Ambulatory Visit: Payer: Self-pay | Admitting: Nurse Practitioner

## 2017-02-01 ENCOUNTER — Other Ambulatory Visit: Payer: Self-pay | Admitting: Student

## 2017-02-02 ENCOUNTER — Ambulatory Visit: Payer: Self-pay | Admitting: Pulmonary Disease

## 2017-02-03 ENCOUNTER — Ambulatory Visit (HOSPITAL_COMMUNITY): Payer: Self-pay

## 2017-02-03 ENCOUNTER — Other Ambulatory Visit: Payer: Self-pay

## 2017-02-03 DIAGNOSIS — R519 Headache, unspecified: Secondary | ICD-10-CM

## 2017-02-03 DIAGNOSIS — R51 Headache: Principal | ICD-10-CM

## 2017-02-03 MED ORDER — IBUPROFEN 400 MG PO TABS: 400.0000 mg | ORAL_TABLET | Freq: Four times a day (QID) | ORAL | 0 refills | Status: DC | PRN

## 2017-02-08 ENCOUNTER — Ambulatory Visit: Payer: Medicare HMO | Admitting: Obstetrics & Gynecology

## 2017-02-08 ENCOUNTER — Encounter: Payer: Self-pay | Admitting: Obstetrics & Gynecology

## 2017-02-08 VITALS — BP 124/62 | HR 73 | Wt 180.2 lb

## 2017-02-08 DIAGNOSIS — N951 Menopausal and female climacteric states: Secondary | ICD-10-CM

## 2017-02-08 MED ORDER — ESTRADIOL 0.5 MG PO TABS: 0.5000 mg | ORAL_TABLET | Freq: Every day | ORAL | 6 refills | Status: DC

## 2017-02-09 ENCOUNTER — Encounter: Payer: Self-pay | Admitting: Obstetrics & Gynecology

## 2017-02-09 NOTE — Progress Notes (Signed)
History:  47 y.o. G1P1001 here today for f/u of ERT. Pt reports that since starting ERT her moods have improved. She reports improvement of vasomotor sx as well.  She thinks that she is doing well on this current dose.   The following portions of the patient's history were reviewed and updated as appropriate: allergies, current medications, past family history, past medical history, past social history, past surgical history and problem list.  Review of Systems:  Pertinent items are noted in HPI.   Objective:  Physical Exam Blood pressure 124/62, pulse 73, weight 180 lb 3.2 oz (81.7 kg), last menstrual period 03/31/2016.  CONSTITUTIONAL: Well-developed, well-nourished female in no acute distress.  HENT:  Normocephalic, atraumatic EYES: Conjunctivae and EOM are normal. No scleral icterus.  NECK: Normal range of motion SKIN: Skin is warm and dry. No rash noted. Not diaphoretic.No pallor. [place]: Alert and oriented to person, place, and time. Normal coordination.   Labs and Imaging US Abdomen Limited Ruq  Result Date: 01/24/2017 CLINICAL DATA:  Abdominal pain EXAM: ULTRASOUND ABDOMEN LIMITED RIGHT UPPER QUADRANT COMPARISON:  05/09/2015 FINDINGS: Gallbladder: No gallstones or wall thickening visualized. No sonographic Murphy sign noted by sonographer. Common bile duct: Diameter: 6 mm Liver: Increased parenchymal echogenicity suggestive of hepatic steatosis. The contour of the liver is slightly irregular. Portal vein is patent on color Doppler imaging with normal direction of blood flow towards the liver. IMPRESSION: 1. No acute findings. 2. Hepatic steatosis and irregular liver contour which may be seen with early cirrhosis. Electronically Signed   By: Kerby Moors M.D.   On: 01/24/2017 08:50    Assessment & Plan:  Perimenopausal state-   Keep Estrace .5mg  po q day  F/u in 3 months or sooner prn  Total face-to-face time with patient was 15 min.  Greater than 50% was spent in counseling  and coordination of care with the patient.   Obert Espindola L. Harraway-Smith, M.D., Cherlynn June

## 2017-02-14 ENCOUNTER — Other Ambulatory Visit: Payer: Self-pay | Admitting: Nurse Practitioner

## 2017-02-14 DIAGNOSIS — Z23 Encounter for immunization: Secondary | ICD-10-CM | POA: Diagnosis not present

## 2017-02-14 DIAGNOSIS — L700 Acne vulgaris: Secondary | ICD-10-CM | POA: Diagnosis not present

## 2017-02-17 ENCOUNTER — Encounter (HOSPITAL_COMMUNITY)
Admission: RE | Admit: 2017-02-17 | Discharge: 2017-02-17 | Disposition: A | Discharge status: Home / Self Care | Payer: Medicare HMO | Source: Ambulatory Visit | Attending: Pulmonary Disease | Admitting: Pulmonary Disease

## 2017-02-17 ENCOUNTER — Encounter (HOSPITAL_COMMUNITY): Payer: Self-pay

## 2017-02-17 VITALS — BP 118/77 | HR 84 | Resp 18 | Ht 67.5 in | Wt 181.7 lb

## 2017-02-17 DIAGNOSIS — G43909 Migraine, unspecified, not intractable, without status migrainosus: Secondary | ICD-10-CM | POA: Insufficient documentation

## 2017-02-17 DIAGNOSIS — J449 Chronic obstructive pulmonary disease, unspecified: Secondary | ICD-10-CM | POA: Diagnosis not present

## 2017-02-17 DIAGNOSIS — J479 Bronchiectasis, uncomplicated: Secondary | ICD-10-CM | POA: Diagnosis present

## 2017-02-17 DIAGNOSIS — K219 Gastro-esophageal reflux disease without esophagitis: Secondary | ICD-10-CM | POA: Diagnosis not present

## 2017-02-17 NOTE — Progress Notes (Signed)
Hannah Barron 47 y.o. female Pulmonary Rehab Orientation Note Patient arrived today in Cardiac and Pulmonary Rehab for orientation to Pulmonary Rehab. She ambulated from [employer]. She does not carry portable oxygen and has not been prescribed oxygen for home use. Color good, skin warm and dry. Patient is oriented to time and place. Patient's medical history, psychosocial health, and medications reviewed. Psychosocial assessment reveals pt lives with their spouse. Pt is currently unemployed. Pt does not have any hobbys. Pt reports her stress level is low. Areas of stress/anxiety include Health.  Pt does not exhibit signs of depression. PHQ2/9 score 0/na. Pt shows good  coping skills with positive outlook. She is offered emotional support and reassurance. Will continue to monitor and evaluate progress toward psychosocial goal(s) of maintaining a positive outlook. Physical assessment reveals heart rate is normal, breath sounds clear to auscultation, no wheezes, rales, or rhonchi, diminished. Grip strength equal, strong. Distal pulses palpable. Patient reports she does take medications as prescribed. Patient states she follows a Regular diet. The patient reports no specific efforts to gain or lose weight. Patient's weight will be monitored closely. Demonstration and practice of PLB using pulse oximeter. Patient able to return demonstration satisfactorily. Safety and hand hygiene in the exercise area reviewed with patient. Patient voices understanding of the information reviewed. Department expectations discussed with patient and achievable goals were set. The patient shows enthusiasm about attending the program and we look forward to working with this nice lady. The patient is scheduled for a 6 min walk test on 02/21/17 and to begin exercise on 02/29/16 at 1030.   45 minutes was spent on a variety of activities such as assessment of the patient, obtaining baseline data including height, weight, BMI, and grip  strength, verifying medical history, allergies, and current medications, and teaching patient strategies for performing tasks with less respiratory effort with emphasis on pursed lip breathing.

## 2017-02-20 ENCOUNTER — Encounter: Payer: Self-pay | Admitting: Pulmonary Disease

## 2017-02-20 ENCOUNTER — Ambulatory Visit: Payer: Medicare HMO | Admitting: Pulmonary Disease

## 2017-02-20 VITALS — BP 122/78 | HR 81 | Ht 67.0 in | Wt 181.0 lb

## 2017-02-20 DIAGNOSIS — J479 Bronchiectasis, uncomplicated: Secondary | ICD-10-CM | POA: Diagnosis not present

## 2017-02-20 DIAGNOSIS — J42 Unspecified chronic bronchitis: Secondary | ICD-10-CM | POA: Diagnosis not present

## 2017-02-20 NOTE — Patient Instructions (Signed)
Continue on your inhalers Use flutter valve 3-4 time daily Follow up in 6 months

## 2017-02-20 NOTE — Progress Notes (Signed)
Hannah Barron    [id number]    Aug 04, 1970  Primary Care Physician:Shambley, Delphia Grates, NP  Referring Physician: [address], [place], [place] [address]  Chief complaint:  Follow up for  Cystic bronchiectasis  HPI: 47 year old with chronic obstructive lung disease, bronchiectasis. She was previously followed at [place] with a VATS biopsy in 2003 and was told she had cystic bronchiectasis. She had followed up with a pulmonologist at [facility] but has moved to [place] on 2017. We have been unable to get records from [place] in spite of multiple attempts.  She is an immigrant from [place] in [place] and does not report any exposure to tuberculosis. She does not recall getting a BCG vaccine as a child. Underwent a bronchoscopy to evaluate for MAI infection and a positive quantiferon as she was unable to give a good sputum sample. All results are negative except for positive PJP on DFA. She underwent further evaluation with negative HIV and beta glucan tests. She had a hysterectomy for uterine fibroids  On 3/27  Interim history: Seen in December by Dr. Lamonte Sakai for bronchiectasis exacerbation.  She was treated with Levaquin.  CT scan at that time redemonstrated bronchiectasis and nodules with no new changes. Followed up with her OB/GYN and is on hormone replacement therapy with Estrace.  Reports improvement in mood symptoms. Chief complaint today is chronic cough with white mucus.  She has chronic dyspnea on exertion.  No fevers, chills, hemoptysis On Symbicort and is using the flutter valve 3-4 times a day regularly now. Started pulmonary rehab a few weeks ago and is doing well on it.  Outpatient Encounter Medications as of 02/20/2017  Medication Sig  . albuterol (VENTOLIN HFA) 108 (90 Base) MCG/ACT inhaler Inhale 2 puffs into the lungs every 6 (six) hours as needed for wheezing or shortness of breath.  . dicyclomine (BENTYL) 20 MG tablet TAKE  1 TABLET (20 MG TOTAL) BY MOUTH DAILY AS NEEDED FOR SPASMS.  Marland Kitchen esomeprazole (NEXIUM) 20 MG capsule Take 1 capsule (20 mg total) by mouth 2 (two) times daily. 30 minutes before meals  . estradiol (ESTRACE) 0.5 MG tablet Take 1 tablet (0.5 mg total) by mouth daily.  Marland Kitchen ibuprofen (ADVIL,MOTRIN) 400 MG tablet Take 1 tablet (400 mg total) by mouth every 6 (six) hours as needed.  . metoprolol succinate (TOPROL-XL) 50 MG 24 hr tablet Take 1 tablet (50mg ) in morning and 1/2 tablet (25mg ) in evening with or immediately following a meal. (Patient taking differently: Take 25-50 mg by mouth See admin instructions. 50 mg in the morning and 25 mg in the evening)  . metoprolol tartrate (LOPRESSOR) 25 MG tablet TAKE 1/2 TABLET BY MOUTH TWICE A DAY  . Respiratory Therapy Supplies (FLUTTER) DEVI Use as directed.  . RESTASIS 0.05 % ophthalmic emulsion Instill 1 drop into both eyes two times a day  . SYMBICORT 160-4.5 MCG/ACT inhaler INHALE 2 PUFFS INTO THE LUNGS 2 (TWO) TIMES DAILY.   Facility-Administered Encounter Medications as of 02/20/2017  Medication  . 0.9 %  sodium chloride infusion    Allergies as of 02/20/2017  . (No Known Allergies)    Past Medical History:  Diagnosis Date  . Anemia   . Bronchiectasis ([facility])   . COPD (chronic obstructive pulmonary disease) ([facility])   . GERD (gastroesophageal reflux disease)   . Migraines   . Paroxysmal SVT (supraventricular tachycardia) ([facility])    a. diagnosed in  11/2015.  . Right leg numbness     Past Surgical History:  Procedure Laterality Date  . CESAREAN SECTION    . CYSTOSCOPY N/A 04/05/2016   Procedure: CYSTOSCOPY;  Surgeon: Lavonia Drafts, MD;  Location: [facility];  Service: Gynecology;  Laterality: N/A;  . LAPAROSCOPIC VAGINAL HYSTERECTOMY WITH SALPINGECTOMY Bilateral 04/05/2016   Procedure: LAPAROSCOPIC ASSISTED VAGINAL HYSTERECTOMY WITH SALPINGECTOMY;  Surgeon: Lavonia Drafts, MD;  Location: [facility];  Service: Gynecology;  Laterality:  Bilateral;  . LUNG BIOPSY    . VIDEO BRONCHOSCOPY Bilateral 03/29/2016   Procedure: VIDEO BRONCHOSCOPY WITHOUT FLUORO;  Surgeon: Marshell Garfinkel, MD;  Location: [facility];  Service: Cardiopulmonary;  Laterality: Bilateral;    Family History  Problem Relation Age of Onset  . Healthy Mother   . Healthy Father     Social History   Socioeconomic History  . Marital status: Married    Spouse name: Not on file  . Number of children: 1  . Years of education: 64  . Highest education level: Not on file  Social Needs  . Financial resource strain: Not on file  . Food insecurity - worry: Not on file  . Food insecurity - inability: Not on file  . Transportation needs - medical: Not on file  . Transportation needs - non-medical: Not on file  Occupational History  . Occupation: disability  Tobacco Use  . Smoking status: Never Smoker  . Smokeless tobacco: Never Used  Substance and Sexual Activity  . Alcohol use: No    Alcohol/week: 0.0 oz  . Drug use: No  . Sexual activity: Yes  Other Topics Concern  . Not on file  Social History Narrative   Fun: Watch TV    Review of systems: Review of Systems  Constitutional: Negative for fever and chills.  HENT: Negative.   Eyes: Negative for blurred vision.  Respiratory: as per HPI  Cardiovascular: Negative for chest pain and palpitations.  Gastrointestinal: Negative for vomiting, diarrhea, blood per rectum. Genitourinary: Negative for dysuria, urgency, frequency and hematuria.  Musculoskeletal: Negative for myalgias, back pain and joint pain.  Skin: Negative for itching and rash.  Neurological: Negative for dizziness, tremors, focal weakness, seizures and loss of consciousness.  Endo/Heme/Allergies: Negative for environmental allergies.  Psychiatric/Behavioral: Negative for depression, suicidal ideas and hallucinations.  All other systems reviewed and are negative.  Physical Exam: Blood pressure 122/78, pulse 81, height 5\' 7"  (1.702  m), weight 181 lb (82.1 kg), last menstrual period 03/31/2016, SpO2 99 %. Gen:      No acute distress HEENT:  EOMI, sclera anicteric Neck:     No masses; no thyromegaly Lungs:    Clear to auscultation bilaterally; normal respiratory effort CV:         Regular rate and rhythm; no murmurs Abd:      + bowel sounds; soft, non-tender; no palpable masses, no distension Ext:    No edema; adequate peripheral perfusion Skin:      Warm and dry; no rash Neuro: alert and oriented x 3 Psych: normal mood and affect  Data Reviewed: A1AT 03/16/16- 161, PIMM Quantiferon 03/16/16- Positive CF panel 03/25/15- negative for 97 mutations analyzed (report scanned)  Labs 03/24/16 IgG 1477 IgA 134 IgM 168 IgE 6 ANA, CCP, RA- negative  HIV 04/01/16- Negative Beta D glucan 04/01/16- Negative  CT high resolution 07/16/15-areas of cystic and varicose bronchiectasis, extensive air trapping, multiple pulmonary nodules. CT chest 03/21/16-areas of bronchiectasis, air trapping and pulmonary nodules. Unchanged compared to 2017 CT angiogram 12/23/16-stable areas  of bronchiectasis, air trapping and pulmonary nodules.  No pulmonary embolism. I have reviewed all images personally.  Myocardial perfusion study 12/2015 >neg  Ischemia , EF 55% Echo 10/2015 >EF 65%.   PFT 06/2015  FVC 2.45 (70%) FEV1 1.54 [56%), F/F 63, TLC 78% Moderate obstructive airway disease, minimal restriction. DLCO could not be completed  03/16/16 FVC 2.29 (69%), FEV1 1.46 (54%), F/F 64  Bronchoscopy 03/29/16 Cultures, AFB, fungal cultures-negative to date PJP DFA-positive Cytology-no malignant cells, CD4: CD8 ratio-1.62 Cell count WBC-26, 26% lymphs, 50% neutrophils, 24% monocyte macrophage  Assessment:  Cystic bronchiectasis She's had workup which is negative for alpha-1 antitrypsin, immunoglobulin deficiency, autoimmune disease, cystic fibrosis and mycobacterial infections.  Recovered from her recent exacerbation in December and is now back to  baseline Continues on Symbicort.  She has been more compliant with a flutter wall over the past few months. Encouraged her to use Mucinex over-the-counter twice daily  Continue pulmonary rehab.  Positive PJP DFA This is likely a false positive as there is no evidence of immunodeficiency, HIV is negative. Besides her CT scan is not characteristic of pneumocystis infection and beta D glucan is negative. We will continue to observe for now.  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 Discussed INH therapy but defer as per pt wishes  Plan/Recommendations: - Continue symbicort, albuterol - Flutter valve,  Mucinex - Pulmonary rehab  Marshell Garfinkel MD  Pulmonary and Critical Care Pager [phone] 02/20/2017, 10:41 AM  CC: [place], FNP

## 2017-02-21 ENCOUNTER — Encounter (HOSPITAL_COMMUNITY)
Admission: RE | Admit: 2017-02-21 | Discharge: 2017-02-21 | Disposition: A | Discharge status: Home / Self Care | Payer: Medicare HMO | Source: Ambulatory Visit | Attending: Pulmonary Disease | Admitting: Pulmonary Disease

## 2017-02-21 DIAGNOSIS — K219 Gastro-esophageal reflux disease without esophagitis: Secondary | ICD-10-CM | POA: Diagnosis not present

## 2017-02-21 DIAGNOSIS — G43909 Migraine, unspecified, not intractable, without status migrainosus: Secondary | ICD-10-CM | POA: Diagnosis not present

## 2017-02-21 DIAGNOSIS — J449 Chronic obstructive pulmonary disease, unspecified: Secondary | ICD-10-CM | POA: Diagnosis not present

## 2017-02-21 DIAGNOSIS — J479 Bronchiectasis, uncomplicated: Secondary | ICD-10-CM | POA: Diagnosis not present

## 2017-02-22 ENCOUNTER — Encounter (HOSPITAL_COMMUNITY): Payer: Self-pay | Admitting: *Deleted

## 2017-02-23 NOTE — Progress Notes (Signed)
Pulmonary Individual Treatment Plan  Patient Details  Name: Hannah Barron MRN: [id number] Date of Birth: 06-23-1970 Referring Provider:     Pulmonary Rehab Walk Test from 02/21/2017 in [place]  Referring Provider  Dr. Vaughan Browner      Initial Encounter Date:    Pulmonary Rehab Walk Test from 02/21/2017 in [place]  Date  02/21/17  Referring Provider  Dr. Vaughan Browner      Visit Diagnosis: Bronchiectasis without complication ([place])  Patient's Home Medications on Admission:   Current Outpatient Medications:  .  albuterol (VENTOLIN HFA) 108 (90 Base) MCG/ACT inhaler, Inhale 2 puffs into the lungs every 6 (six) hours as needed for wheezing or shortness of breath., Disp: 3 Inhaler, Rfl: 3 .  dicyclomine (BENTYL) 20 MG tablet, TAKE 1 TABLET (20 MG TOTAL) BY MOUTH DAILY AS NEEDED FOR SPASMS., Disp: 30 tablet, Rfl: 0 .  esomeprazole (NEXIUM) 20 MG capsule, Take 1 capsule (20 mg total) by mouth 2 (two) times daily. 30 minutes before meals, Disp: 60 capsule, Rfl: 2 .  estradiol (ESTRACE) 0.5 MG tablet, Take 1 tablet (0.5 mg total) by mouth daily., Disp: 30 tablet, Rfl: 6 .  ibuprofen (ADVIL,MOTRIN) 400 MG tablet, Take 1 tablet (400 mg total) by mouth every 6 (six) hours as needed., Disp: 90 tablet, Rfl: 0 .  metoprolol succinate (TOPROL-XL) 50 MG 24 hr tablet, Take 1 tablet (50mg ) in morning and 1/2 tablet (25mg ) in evening with or immediately following a meal. (Patient taking differently: Take 25-50 mg by mouth See admin instructions. 50 mg in the morning and 25 mg in the evening), Disp: 135 tablet, Rfl: 1 .  metoprolol tartrate (LOPRESSOR) 25 MG tablet, TAKE 1/2 TABLET BY MOUTH TWICE A DAY, Disp: 90 tablet, Rfl: 3 .  Respiratory Therapy Supplies (FLUTTER) DEVI, Use as directed., Disp: 1 each, Rfl: 0 .  RESTASIS 0.05 % ophthalmic emulsion, Instill 1 drop into both eyes two times a day, Disp: , Rfl: 2 .  SYMBICORT 160-4.5 MCG/ACT inhaler,  INHALE 2 PUFFS INTO THE LUNGS 2 (TWO) TIMES DAILY., Disp: 10.2 Inhaler, Rfl: 4  Current Facility-Administered Medications:  .  0.9 %  sodium chloride infusion, 500 mL, Intravenous, Once, Danis, Kirke Corin, MD  Past Medical History: Past Medical History:  Diagnosis Date  . Anemia   . Bronchiectasis ([place])   . COPD (chronic obstructive pulmonary disease) ([place])   . GERD (gastroesophageal reflux disease)   . Migraines   . Paroxysmal SVT (supraventricular tachycardia) ([place])    a. diagnosed in 11/2015.  . Right leg numbness     Tobacco Use: Social History   Tobacco Use  Smoking Status Never Smoker  Smokeless Tobacco Never Used    Labs: Recent Review Flowsheet Data    Labs for ITP Cardiac and Pulmonary Rehab Latest Ref Rng & Units 06/04/2015 04/28/2016   Cholestrol 0 - 200 mg/dL - 194   LDLCALC 0 - 99 mg/dL - 111(H)   HDL >39.00 mg/dL - 62.00   Trlycerides 0.0 - 149.0 mg/dL - 108.0   Hemoglobin A1c 4.6 - 6.5 % 5.9 -      Capillary Blood Glucose: No results found for: GLUCAP   Pulmonary Assessment Scores: Pulmonary Assessment Scores    Row Name 02/22/17 1458 02/23/17 0710       ADL UCSD   ADL Phase  Entry  Entry    SOB Score total  55  -      CAT Score  CAT Score  40 Entry  -      mMRC Score   mMRC Score  -  4       Pulmonary Function Assessment: Pulmonary Function Assessment - 02/17/17 1102      Breath   Bilateral Breath Sounds  Clear;Decreased    Shortness of Breath  Limiting activity;Fear of Shortness of Breath;Yes       Exercise Target Goals: Date: 02/21/17  Exercise Program Goal: Individual exercise prescription set using results from initial 6 min walk test and THRR while considering  patient's activity barriers and safety.    Exercise Prescription Goal: Initial exercise prescription builds to 30-45 minutes a day of aerobic activity, 2-3 days per week.  Home exercise guidelines will be given to patient during program as part of exercise  prescription that the participant will acknowledge.  Activity Barriers & Risk Stratification: Activity Barriers & Cardiac Risk Stratification - 02/17/17 1041      Activity Barriers & Cardiac Risk Stratification   Activity Barriers  Deconditioning;Shortness of Breath       6 Minute Walk: 6 Minute Walk    Row Name 02/23/17 0710         6 Minute Walk   Phase  Initial     Distance  1332 feet     Walk Time  6 minutes     # of Rest Breaks  0     MPH  2.52     METS  2.91     RPE  12     Perceived Dyspnea   3     Symptoms  No     Resting HR  71 bpm     Resting BP  138/80     Resting Oxygen Saturation   99 %     Exercise Oxygen Saturation  during 6 min walk  98 %     Max Ex. HR  101 bpm     Max Ex. BP  114/64       Interval HR   1 Minute HR  90     2 Minute HR  91     3 Minute HR  101     4 Minute HR  94     5 Minute HR  101     6 Minute HR  90     2 Minute Post HR  91     Interval Heart Rate?  Yes       Interval Oxygen   Interval Oxygen?  Yes     Baseline Oxygen Saturation %  99 %     1 Minute Oxygen Saturation %  99 %     1 Minute Liters of Oxygen  0 L     2 Minute Oxygen Saturation %  100 %     2 Minute Liters of Oxygen  0 L     3 Minute Oxygen Saturation %  99 %     3 Minute Liters of Oxygen  0 L     4 Minute Oxygen Saturation %  99 %     4 Minute Liters of Oxygen  0 L     5 Minute Oxygen Saturation %  99 %     5 Minute Liters of Oxygen  0 L     6 Minute Oxygen Saturation %  98 %     6 Minute Liters of Oxygen  0 L     2 Minute Post Oxygen Saturation %  99 %  2 Minute Post Liters of Oxygen  0 L        Oxygen Initial Assessment: Oxygen Initial Assessment - 02/23/17 0710      Initial 6 min Walk   Oxygen Used  None      Program Oxygen Prescription   Program Oxygen Prescription  None       Oxygen Re-Evaluation:   Oxygen Discharge (Final Oxygen Re-Evaluation):   Initial Exercise Prescription: Initial Exercise Prescription - 02/23/17 0700       Date of Initial Exercise RX and Referring Provider   Date  02/21/17    Referring Provider  Dr. Vaughan Browner      Treadmill   MPH  1.7    Grade  0    Minutes  17      NuStep   Level  2    SPM  80    Minutes  17    METs  1.5      Rower   Level  2    Watts  10    Minutes  17      Prescription Details   Frequency (times per week)  2    Duration  Progress to 45 minutes of aerobic exercise without signs/symptoms of physical distress      Intensity   THRR 40-80% of Max Heartrate  70-139    Ratings of Perceived Exertion  11-13    Perceived Dyspnea  0-4      Progression   Progression  Continue to progress workloads to maintain intensity without signs/symptoms of physical distress.      Resistance Training   Training Prescription  Yes    Weight  orange bands    Reps  10-15       Perform Capillary Blood Glucose checks as needed.  Exercise Prescription Changes:   Exercise Comments:   Exercise Goals and Review: Exercise Goals    Row Name 02/17/17 1042             Exercise Goals   Increase Physical Activity  Yes       Intervention  Provide advice, education, support and counseling about physical activity/exercise needs.;Develop an individualized exercise prescription for aerobic and resistive training based on initial evaluation findings, risk stratification, comorbidities and participant's personal goals.       Expected Outcomes  Short Term: Attend rehab on a regular basis to increase amount of physical activity.       Increase Strength and Stamina  Yes       Intervention  Provide advice, education, support and counseling about physical activity/exercise needs.;Develop an individualized exercise prescription for aerobic and resistive training based on initial evaluation findings, risk stratification, comorbidities and participant's personal goals.       Expected Outcomes  Short Term: Increase workloads from initial exercise prescription for resistance, speed, and METs.        Able to understand and use rate of perceived exertion (RPE) scale  Yes       Intervention  Provide education and explanation on how to use RPE scale       Expected Outcomes  Short Term: Able to use RPE daily in rehab to express subjective intensity level;Long Term:  Able to use RPE to guide intensity level when exercising independently       Able to understand and use Dyspnea scale  Yes       Intervention  Provide education and explanation on how to use Dyspnea scale       Expected  Outcomes  Short Term: Able to use Dyspnea scale daily in rehab to express subjective sense of shortness of breath during exertion;Long Term: Able to use Dyspnea scale to guide intensity level when exercising independently       Knowledge and understanding of Target Heart Rate Range (THRR)  Yes       Intervention  Provide education and explanation of THRR including how the numbers were predicted and where they are located for reference       Expected Outcomes  Short Term: Able to state/look up THRR       Understanding of Exercise Prescription  Yes       Intervention  Provide education, explanation, and written materials on patient's individual exercise prescription       Expected Outcomes  Short Term: Able to explain program exercise prescription;Long Term: Able to explain home exercise prescription to exercise independently          Exercise Goals Re-Evaluation :   Discharge Exercise Prescription (Final Exercise Prescription Changes):   Nutrition:  Target Goals: Understanding of nutrition guidelines, daily intake of sodium 1500mg , cholesterol 200mg , calories 30% from fat and 7% or less from saturated fats, daily to have 5 or more servings of fruits and vegetables.  Biometrics: Pre Biometrics - 02/17/17 1047      Pre Biometrics   Grip Strength  25 kg        Nutrition Therapy Plan and Nutrition Goals:   Nutrition Assessments:   Nutrition Goals Re-Evaluation:   Nutrition Goals Discharge (Final  Nutrition Goals Re-Evaluation):   Psychosocial: Target Goals: Acknowledge presence or absence of significant depression and/or stress, maximize coping skills, provide positive support system. Participant is able to verbalize types and ability to use techniques and skills needed for reducing stress and depression.  Initial Review & Psychosocial Screening: Initial Psych Review & Screening - 02/17/17 1102      Initial Review   Current issues with  Current Stress Concerns;Current Sleep Concerns    Source of Stress Concerns  Chronic Illness;Unable to participate in former interests or hobbies;Unable to perform yard/household activities    Comments  patient states sleep problems my be a result of [place]?  Yes    Comments  no friends in area. All family back in [place]. Ex husband still friend. New husband of 3 years supportive and daughter at college      Barriers   Psychosocial barriers to participate in program  There are no identifiable barriers or psychosocial needs.      Screening Interventions   Interventions  Encouraged to exercise       Quality of Life Scores:  Scores of 19 and below usually indicate a poorer quality of life in these areas.  A difference of  2-3 points is a clinically meaningful difference.  A difference of 2-3 points in the total score of the Quality of Life Index has been associated with significant improvement in overall quality of life, self-image, physical symptoms, and general health in studies assessing change in quality of life.   PHQ-9: Recent Review Flowsheet Data    Depression screen [facility] 2/9 02/17/2017 12/29/2016 08/23/2016 04/21/2016 04/01/2016   Decreased Interest 0 1 - 3 3   Down, Depressed, Hopeless 0 2 1 2  0   PHQ - 2 Score 0 3 1 5 3    Altered sleeping - 0 0 0 3   Tired, decreased energy - 3 2 2  2   Change in appetite - 3 0 3 3   Feeling bad or failure about yourself  - 0 0 0 0   Trouble concentrating -  2 0 0 2   Moving slowly or fidgety/restless - - 0 0 0   Suicidal thoughts - 0 0 0 0   PHQ-9 Score - 11 3 10 13      Interpretation of Total Score  Total Score Depression Severity:  1-4 = Minimal depression, 5-9 = Mild depression, 10-14 = Moderate depression, 15-19 = Moderately severe depression, 20-27 = Severe depression   Psychosocial Evaluation and Intervention: Psychosocial Evaluation - 02/17/17 1104      Psychosocial Evaluation & Interventions   Interventions  Encouraged to exercise with the program and follow exercise prescription    Expected Outcomes  patient will remain free from psychosocial barriers to participation       Psychosocial Re-Evaluation:   Psychosocial Discharge (Final Psychosocial Re-Evaluation):   Education: Education Goals: Education classes will be provided on a weekly basis, covering required topics. Participant will state understanding/return demonstration of topics presented.  Learning Barriers/Preferences: Learning Barriers/Preferences - 02/17/17 1101      Learning Barriers/Preferences   Learning Barriers  Language    Learning Preferences  Individual Instruction;Verbal Instruction       Education Topics: Risk Factor Reduction:  -Group instruction that is supported by a PowerPoint presentation. Instructor discusses the definition of a risk factor, different risk factors for pulmonary disease, and how the heart and lungs work together.     Nutrition for Pulmonary Patient:  -Group instruction provided by PowerPoint slides, verbal discussion, and written materials to support subject matter. The instructor gives an explanation and review of healthy diet recommendations, which includes a discussion on weight management, recommendations for fruit and vegetable consumption, as well as protein, fluid, caffeine, fiber, sodium, sugar, and alcohol. Tips for eating when patients are short of breath are discussed.   Pursed Lip Breathing:  -Group  instruction that is supported by demonstration and informational handouts. Instructor discusses the benefits of pursed lip and diaphragmatic breathing and detailed demonstration on how to preform both.     Oxygen Safety:  -Group instruction provided by PowerPoint, verbal discussion, and written material to support subject matter. There is an overview of "What is Oxygen" and "Why do we need it".  Instructor also reviews how to create a safe environment for oxygen use, the importance of using oxygen as prescribed, and the risks of noncompliance. There is a brief discussion on traveling with oxygen and resources the patient may utilize.   Oxygen Equipment:  -Group instruction provided by [facility] Staff utilizing handouts, written materials, and equipment demonstrations.   Signs and Symptoms:  -Group instruction provided by written material and verbal discussion to support subject matter. Warning signs and symptoms of infection, stroke, and heart attack are reviewed and when to call the physician/911 reinforced. Tips for preventing the spread of infection discussed.   Advanced Directives:  -Group instruction provided by verbal instruction and written material to support subject matter. Instructor reviews Advanced Directive laws and proper instruction for filling out document.   Pulmonary Video:  -Group video education that reviews the importance of medication and oxygen compliance, exercise, good nutrition, pulmonary hygiene, and pursed lip and diaphragmatic breathing for the pulmonary patient.   Exercise for the Pulmonary Patient:  -Group instruction that is supported by a PowerPoint presentation. Instructor discusses benefits of exercise, core components of exercise, frequency, duration, and intensity of an  exercise routine, importance of utilizing pulse oximetry during exercise, safety while exercising, and options of places to exercise outside of rehab.     Pulmonary Medications:   -Verbally interactive group education provided by instructor with focus on inhaled medications and proper administration.   Anatomy and Physiology of the Respiratory System and Intimacy:  -Group instruction provided by PowerPoint, verbal discussion, and written material to support subject matter. Instructor reviews respiratory cycle and anatomical components of the respiratory system and their functions. Instructor also reviews differences in obstructive and restrictive respiratory diseases with examples of each. Intimacy, Sex, and Sexuality differences are reviewed with a discussion on how relationships can change when diagnosed with pulmonary disease. Common sexual concerns are reviewed.   MD DAY -A group question and answer session with a medical doctor that allows participants to ask questions that relate to their pulmonary disease state.   OTHER EDUCATION -Group or individual verbal, written, or video instructions that support the educational goals of the pulmonary rehab program.   Holiday Eating Survival Tips:  -Group instruction provided by PowerPoint slides, verbal discussion, and written materials to support subject matter. The instructor gives patients tips, tricks, and techniques to help them not only survive but enjoy the holidays despite the onslaught of food that accompanies the holidays.   Knowledge Questionnaire Score: Knowledge Questionnaire Score - 02/22/17 1457      Knowledge Questionnaire Score   Pre Score  12/13 Staff assisted pt with test       Core Components/Risk Factors/Patient Goals at Admission: Personal Goals and Risk Factors at Admission - 02/17/17 1102      Core Components/Risk Factors/Patient Goals on Admission   Improve shortness of breath with ADL's  Yes    Intervention  Provide education, individualized exercise plan and daily activity instruction to help decrease symptoms of SOB with activities of daily living.    Expected Outcomes  Short Term:  Improve cardiorespiratory fitness to achieve a reduction of symptoms when performing ADLs;Long Term: Be able to perform more ADLs without symptoms or delay the onset of symptoms       Core Components/Risk Factors/Patient Goals Review:    Core Components/Risk Factors/Patient Goals at Discharge (Final Review):    ITP Comments:   Comments:

## 2017-02-28 ENCOUNTER — Encounter (HOSPITAL_COMMUNITY)
Admission: RE | Admit: 2017-02-28 | Discharge: 2017-02-28 | Disposition: A | Discharge status: Home / Self Care | Payer: Medicare HMO | Source: Ambulatory Visit | Attending: Pulmonary Disease | Admitting: Pulmonary Disease

## 2017-02-28 ENCOUNTER — Other Ambulatory Visit: Payer: Self-pay

## 2017-02-28 VITALS — Wt 179.0 lb

## 2017-02-28 DIAGNOSIS — J479 Bronchiectasis, uncomplicated: Secondary | ICD-10-CM

## 2017-02-28 DIAGNOSIS — K219 Gastro-esophageal reflux disease without esophagitis: Secondary | ICD-10-CM | POA: Diagnosis not present

## 2017-02-28 DIAGNOSIS — R51 Headache: Principal | ICD-10-CM

## 2017-02-28 DIAGNOSIS — R519 Headache, unspecified: Secondary | ICD-10-CM

## 2017-02-28 DIAGNOSIS — J449 Chronic obstructive pulmonary disease, unspecified: Secondary | ICD-10-CM | POA: Diagnosis not present

## 2017-02-28 DIAGNOSIS — G43909 Migraine, unspecified, not intractable, without status migrainosus: Secondary | ICD-10-CM | POA: Diagnosis not present

## 2017-02-28 MED ORDER — IBUPROFEN 400 MG PO TABS: 400.0000 mg | ORAL_TABLET | Freq: Four times a day (QID) | ORAL | 0 refills | Status: DC | PRN

## 2017-02-28 NOTE — Progress Notes (Signed)
Daily Session Note  Patient Details  Name: Hannah Barron MRN: [id number] Date of Birth: 02-May-1970 Referring Provider:     Pulmonary Rehab Walk Test from 02/21/2017 in [place]  Referring Provider  Dr. Vaughan Browner      Encounter Date: 02/28/2017  Check In: Session Check In - 02/28/17 1030      Check-In   Location  [facility]    Staff Present  Rosebud Poles, RN, Luisa Hart, RN, BSN;Molly diVincenzo, MS, ACSM RCEP, Exercise Physiologist;Trung Wenzl Ysidro Evert, RN    Supervising physician immediately available to respond to emergencies  [facility] immediately available    Physician(s)  Dr. Tyrell Antonio    Medication changes reported      No    Fall or balance concerns reported     No    Tobacco Cessation  No Change    Warm-up and Cool-down  Performed as group-led instruction    Resistance Training Performed  Yes    VAD Patient?  No      Pain Assessment   Currently in Pain?  No/denies    Multiple Pain Sites  No       Capillary Blood Glucose: No results found for this or any previous visit (from the past 24 hour(s)).  Exercise Prescription Changes - 02/28/17 1200      Response to Exercise   Blood Pressure (Admit)  104/60    Blood Pressure (Exercise)  138/64    Blood Pressure (Exit)  104/70    Heart Rate (Admit)  73 bpm    Heart Rate (Exercise)  98 bpm    Heart Rate (Exit)  81 bpm    Oxygen Saturation (Admit)  98 %    Oxygen Saturation (Exercise)  98 %    Oxygen Saturation (Exit)  100 %    Rating of Perceived Exertion (Exercise)  15    Perceived Dyspnea (Exercise)  2    Duration  Progress to 45 minutes of aerobic exercise without signs/symptoms of physical distress    Intensity  Other (comment) 40-80% of HRR      Progression   Progression  Continue to progress workloads to maintain intensity without signs/symptoms of physical distress.      Resistance Training   Training Prescription  Yes    Weight  orange bands    Reps   10-15    Time  10 Minutes      Treadmill   MPH  2.3    Grade  2    Minutes  17      NuStep   Level  2    Minutes  17    METs  2.2      Rower   Level  2    Minutes  17       Social History   Tobacco Use  Smoking Status Never Smoker  Smokeless Tobacco Never Used    Goals Met:  Exercise tolerated well No report of cardiac concerns or symptoms Strength training completed today  Goals Unmet:  Not Applicable  Comments: Service time is from 1030 to 1200    Dr. Rush Farmer is Medical Director for Pulmonary Rehab at [facility].

## 2017-03-02 ENCOUNTER — Encounter (HOSPITAL_COMMUNITY)
Admission: RE | Admit: 2017-03-02 | Discharge: 2017-03-02 | Disposition: A | Discharge status: Home / Self Care | Payer: Medicare HMO | Source: Ambulatory Visit | Attending: Pulmonary Disease | Admitting: Pulmonary Disease

## 2017-03-02 DIAGNOSIS — K219 Gastro-esophageal reflux disease without esophagitis: Secondary | ICD-10-CM | POA: Diagnosis not present

## 2017-03-02 DIAGNOSIS — J449 Chronic obstructive pulmonary disease, unspecified: Secondary | ICD-10-CM | POA: Diagnosis not present

## 2017-03-02 DIAGNOSIS — J479 Bronchiectasis, uncomplicated: Secondary | ICD-10-CM

## 2017-03-02 DIAGNOSIS — G43909 Migraine, unspecified, not intractable, without status migrainosus: Secondary | ICD-10-CM | POA: Diagnosis not present

## 2017-03-02 NOTE — Progress Notes (Signed)
Daily Session Note  Patient Details  Name: Hannah Barron MRN: [id number] Date of Birth: 03/07/1970 Referring Provider:     Pulmonary Rehab Walk Test from 02/21/2017 in [facility]  Referring Provider  Dr. Mannam      Encounter Date: 03/02/2017  Check In: Session Check In - 03/02/17 1030      Check-In   Location  [facility]    Staff Present  Joan Behrens, RN, BSN;Molly diVincenzo, MS, ACSM RCEP, Exercise Physiologist;Lisa Hughes, RN;Portia Payne, RN, BSN    Supervising physician immediately available to respond to emergencies  [facility] immediately available    Physician(s)  Dr. Ezenduka    Medication changes reported      No    Fall or balance concerns reported     No    Tobacco Cessation  No Change    Warm-up and Cool-down  Performed as group-led instruction    Resistance Training Performed  Yes    VAD Patient?  No      Pain Assessment   Currently in Pain?  No/denies    Multiple Pain Sites  No       Capillary Blood Glucose: No results found for this or any previous visit (from the past 24 hour(s)).    Social History   Tobacco Use  Smoking Status Never Smoker  Smokeless Tobacco Never Used    Goals Met:  Exercise tolerated well Strength training completed today  Goals Unmet:  Not Applicable  Comments: Service time is from 1030 to 1200    Dr. Wesam G. Yacoub is Medical Director for Pulmonary Rehab at [facility]. 

## 2017-03-03 NOTE — Progress Notes (Signed)
Hannah Barron 47 y.o. female  DOB: [birth year] MRN: [id number]           Nutrition Brief Note 1. Bronchiectasis without complication [facility])    Past Medical History:  Diagnosis Date  . Anemia   . Bronchiectasis ([place])   . COPD (chronic obstructive pulmonary disease) ([place])   . GERD (gastroesophageal reflux disease)   . Migraines   . Paroxysmal SVT (supraventricular tachycardia) ([place])    a. diagnosed in 11/2015.  . Right leg numbness    Meds reviewed.   Ht: Ht Readings from Last 1 Encounters:  02/20/17 5\' 7"  (1.702 m)     Wt:  Wt Readings from Last 3 Encounters:  02/28/17 179 lb 0.2 oz (81.2 kg)  02/20/17 181 lb (82.1 kg)  02/17/17 181 lb 10.5 oz (82.4 kg)     BMI: 28.34    Current tobacco use? No  Labs:  Lipid Panel     Component Value Date/Time   CHOL 194 04/28/2016 1614   TRIG 108.0 04/28/2016 1614   HDL 62.00 04/28/2016 1614   CHOLHDL 3 04/28/2016 1614   VLDL 21.6 04/28/2016 1614   LDLCALC 111 (H) 04/28/2016 1614    Lab Results  Component Value Date   HGBA1C 5.9 06/04/2015    Nutrition Diagnosis ? Food-and nutrition-related knowledge deficit related to lack of exposure to information as related to diagnosis of pulmonary disease ? Overweight related to excessive energy intake as evidenced by a BMI of 28.34  Goal(s) 1. Identify food quantities necessary to achieve wt loss of  -2# per week to a goal wt loss of 2.7-10.9 kg (6-24 lb) at graduation from pulmonary rehab.  Plan:  Pt to attend Pulmonary Nutrition class Will provide client-centered nutrition education as part of interdisciplinary care.   Monitor and evaluate progress toward nutrition goal with team.  Monitor and Evaluate progress toward nutrition goal with team.   Derek Mound, M.Ed, RD, LDN, CDE 03/03/2017 1:54 PM

## 2017-03-07 ENCOUNTER — Encounter (HOSPITAL_COMMUNITY)
Admission: RE | Admit: 2017-03-07 | Discharge: 2017-03-07 | Disposition: A | Discharge status: Home / Self Care | Payer: Medicare HMO | Source: Ambulatory Visit | Attending: Pulmonary Disease | Admitting: Pulmonary Disease

## 2017-03-07 DIAGNOSIS — J479 Bronchiectasis, uncomplicated: Secondary | ICD-10-CM | POA: Diagnosis not present

## 2017-03-07 DIAGNOSIS — J449 Chronic obstructive pulmonary disease, unspecified: Secondary | ICD-10-CM | POA: Diagnosis not present

## 2017-03-07 DIAGNOSIS — K219 Gastro-esophageal reflux disease without esophagitis: Secondary | ICD-10-CM | POA: Diagnosis not present

## 2017-03-07 DIAGNOSIS — G43909 Migraine, unspecified, not intractable, without status migrainosus: Secondary | ICD-10-CM | POA: Diagnosis not present

## 2017-03-07 NOTE — Progress Notes (Signed)
Daily Session Note  Patient Details  Name: Hannah Barron MRN: [id number] Date of Birth: 1970-05-22 Referring Provider:     Pulmonary Rehab Walk Test from 02/21/2017 in [place]  Referring Provider  Dr. Vaughan Browner      Encounter Date: 03/07/2017  Check In: Session Check In - 03/07/17 1030      Check-In   Location  [facility]    Staff Present  Rosebud Poles, RN, BSN;Molly diVincenzo, MS, ACSM RCEP, Exercise Physiologist;Lisa Ysidro Evert, RN;Treylen Gibbs Rollene Rotunda, RN, BSN    Supervising physician immediately available to respond to emergencies  [facility] immediately available    Physician(s)  Dr. Horris Latino    Medication changes reported      No    Fall or balance concerns reported     No    Tobacco Cessation  No Change    Warm-up and Cool-down  Performed as group-led instruction    Resistance Training Performed  Yes    VAD Patient?  No      Pain Assessment   Currently in Pain?  No/denies    Multiple Pain Sites  No       Capillary Blood Glucose: No results found for this or any previous visit (from the past 24 hour(s)).    Social History   Tobacco Use  Smoking Status Never Smoker  Smokeless Tobacco Never Used    Goals Met:  Exercise tolerated well Queuing for purse lip breathing No report of cardiac concerns or symptoms Strength training completed today  Goals Unmet:  Not Applicable  Comments: Service time is from 1030 to 1200   Dr. Rush Farmer is Medical Director for Pulmonary Rehab at [facility].

## 2017-03-09 ENCOUNTER — Encounter (HOSPITAL_COMMUNITY)
Admission: RE | Admit: 2017-03-09 | Discharge: 2017-03-09 | Disposition: A | Discharge status: Home / Self Care | Payer: Medicare HMO | Source: Ambulatory Visit | Attending: Pulmonary Disease | Admitting: Pulmonary Disease

## 2017-03-09 VITALS — Wt 176.4 lb

## 2017-03-09 DIAGNOSIS — J479 Bronchiectasis, uncomplicated: Secondary | ICD-10-CM

## 2017-03-09 DIAGNOSIS — J449 Chronic obstructive pulmonary disease, unspecified: Secondary | ICD-10-CM | POA: Diagnosis not present

## 2017-03-09 DIAGNOSIS — G43909 Migraine, unspecified, not intractable, without status migrainosus: Secondary | ICD-10-CM | POA: Diagnosis not present

## 2017-03-09 DIAGNOSIS — K219 Gastro-esophageal reflux disease without esophagitis: Secondary | ICD-10-CM | POA: Diagnosis not present

## 2017-03-09 NOTE — Progress Notes (Signed)
Daily Session Note  Patient Details  Name: Hannah Barron MRN: [id number] Date of Birth: July 26, 1970 Referring Provider:     Pulmonary Rehab Walk Test from 02/21/2017 in [place]  Referring Provider  Dr. Vaughan Browner      Encounter Date: 03/09/2017  Check In: Session Check In - 03/09/17 1030      Check-In   Location  [facility]    Staff Present  Rosebud Poles, RN, BSN;Molly diVincenzo, MS, ACSM RCEP, Exercise Physiologist;Lisa Ysidro Evert, RN;Portia Rollene Rotunda, RN, BSN    Supervising physician immediately available to respond to emergencies  [facility] immediately available    Physician(s)  Dr. Eliseo Squires    Medication changes reported      No    Fall or balance concerns reported     No    Tobacco Cessation  No Change    Warm-up and Cool-down  Performed as group-led instruction    Resistance Training Performed  Yes    VAD Patient?  No      Pain Assessment   Currently in Pain?  No/denies    Multiple Pain Sites  No       Capillary Blood Glucose: No results found for this or any previous visit (from the past 24 hour(s)).    Social History   Tobacco Use  Smoking Status Never Smoker  Smokeless Tobacco Never Used    Goals Met:  Exercise tolerated well Strength training completed today  Goals Unmet:  Not Applicable  Comments: Service time is from 1030 to 1225.    Dr. Rush Farmer is Medical Director for Pulmonary Rehab at [facility].

## 2017-03-09 NOTE — Progress Notes (Signed)
I have reviewed a Home Exercise Prescription with [name] . Hannah Barron is not currently exercising at home.  The patient was advised to walk 2 days a week for 30-45 minutes.  Keandra and I discussed how to progress their exercise prescription.  The patient stated that their goals were to feel better, more energy, lifestyle changes, and decrease medication use.  The patient stated that they understand the exercise prescription.  We reviewed exercise guidelines, target heart rate during exercise, oxygen use, weather, home pulse oximeter, endpoints for exercise, and goals.  Patient is encouraged to come to me with any questions. I will continue to follow up with the patient to assist them with progression and safety.

## 2017-03-14 ENCOUNTER — Encounter: Payer: Self-pay | Admitting: Gastroenterology

## 2017-03-14 ENCOUNTER — Ambulatory Visit: Payer: Medicare HMO | Admitting: Gastroenterology

## 2017-03-14 ENCOUNTER — Telehealth (HOSPITAL_COMMUNITY): Payer: Self-pay | Admitting: Nurse Practitioner

## 2017-03-14 ENCOUNTER — Encounter (INDEPENDENT_AMBULATORY_CARE_PROVIDER_SITE_OTHER): Payer: Self-pay

## 2017-03-14 ENCOUNTER — Encounter (HOSPITAL_COMMUNITY): Payer: Medicare HMO

## 2017-03-14 VITALS — BP 100/64 | HR 84 | Ht 67.0 in | Wt 178.0 lb

## 2017-03-14 DIAGNOSIS — G8929 Other chronic pain: Secondary | ICD-10-CM

## 2017-03-14 DIAGNOSIS — R1013 Epigastric pain: Secondary | ICD-10-CM

## 2017-03-14 DIAGNOSIS — R6881 Early satiety: Secondary | ICD-10-CM

## 2017-03-14 MED ORDER — BUSPIRONE HCL 15 MG PO TABS: 15.0000 mg | ORAL_TABLET | Freq: Every day | ORAL | 0 refills | Status: DC

## 2017-03-14 NOTE — Progress Notes (Signed)
Daily Session Note  Patient Details  Name: Hannah Barron MRN: [id number] Date of Birth: 1970/04/06 Referring Provider:     Pulmonary Rehab Walk Test from 02/21/2017 in [place]  Referring Provider  Dr. Vaughan Browner      Encounter Date: 03/09/2017  Check In:   Capillary Blood Glucose: No results found for this or any previous visit (from the past 24 hour(s)).    Social History   Tobacco Use  Smoking Status Never Smoker  Smokeless Tobacco Never Used    Goals Met:  Exercise tolerated well Strength training completed today  Goals Unmet:  Not Applicable  Comments: Service time is from 1030 to 1225.    Dr. Rush Farmer is Medical Director for Pulmonary Rehab at [facility].

## 2017-03-14 NOTE — Patient Instructions (Signed)
If you are age 47 or older, your body mass index should be between 23-30. Your Body mass index is 27.88 kg/m. If this is out of the aforementioned range listed, please consider follow up with your Primary Care Provider.  If you are age 13 or younger, your body mass index should be between 19-25. Your Body mass index is 27.88 kg/m. If this is out of the aformentioned range listed, please consider follow up with your Primary Care Provider.   Please STOP the dicyclomine.  Start the buspirone 1/2 tablet at bedtime for 1 week, then increase to to 1 tablet at bedtime.  Please call in 2-4 weeks with an update on your symptoms. [phone] ask for Julie(nurse)  Thank you for choosing [facility]  Dr Wilfrid Lund III

## 2017-03-14 NOTE — Progress Notes (Signed)
[facility] Progress Note  Chief Complaint: Epigastric pain  Subjective  History:  This is a 47 year old woman following up after recent upper endoscopy.  She continues to have epigastric pain that sometimes is felt on the right side as well.  It might come on after eating, but not necessarily.  She feels a tightness that is worse when she sits for long period of time or bends over.  There is also early satiety, no nausea or vomiting.  The dicyclomine that she had taken years ago for this same problem while living in [place] has not been helpful on this occasion.  Right upper quadrant ultrasound showed only a coarse echotexture to the liver, no gallstones. She denies dysphagia or odynophagia.  Appetite is good, but she feels full with just a small amount, no weight loss. She gets intermittent heartburn when there is regurgitation.    ROS: Cardiovascular:  no chest pain Respiratory: no dyspnea Remainder of systems negative except as above The patient's Past Medical, Family and Social History were reviewed and are on file in the EMR.  Objective:  Med list reviewed  Current Outpatient Medications:  .  albuterol (VENTOLIN HFA) 108 (90 Base) MCG/ACT inhaler, Inhale 2 puffs into the lungs every 6 (six) hours as needed for wheezing or shortness of breath., Disp: 3 Inhaler, Rfl: 3 .  dicyclomine (BENTYL) 20 MG tablet, TAKE 1 TABLET (20 MG TOTAL) BY MOUTH DAILY AS NEEDED FOR SPASMS., Disp: 30 tablet, Rfl: 0 .  esomeprazole (NEXIUM) 20 MG capsule, Take 1 capsule (20 mg total) by mouth 2 (two) times daily. 30 minutes before meals, Disp: 60 capsule, Rfl: 2 .  estradiol (ESTRACE) 0.5 MG tablet, Take 1 tablet (0.5 mg total) by mouth daily., Disp: 30 tablet, Rfl: 6 .  ibuprofen (ADVIL,MOTRIN) 400 MG tablet, Take 1 tablet (400 mg total) by mouth every 6 (six) hours as needed., Disp: 90 tablet, Rfl: 0 .  metoprolol succinate (TOPROL-XL) 50 MG 24 hr tablet, Take 1 tablet (50mg ) in morning  and 1/2 tablet (25mg ) in evening with or immediately following a meal. (Patient taking differently: Take 25-50 mg by mouth See admin instructions. 50 mg in the morning and 25 mg in the evening), Disp: 135 tablet, Rfl: 1 .  metoprolol tartrate (LOPRESSOR) 25 MG tablet, TAKE 1/2 TABLET BY MOUTH TWICE A DAY, Disp: 90 tablet, Rfl: 3 .  Respiratory Therapy Supplies (FLUTTER) DEVI, Use as directed., Disp: 1 each, Rfl: 0 .  RESTASIS 0.05 % ophthalmic emulsion, Instill 1 drop into both eyes two times a day, Disp: , Rfl: 2 .  SYMBICORT 160-4.5 MCG/ACT inhaler, INHALE 2 PUFFS INTO THE LUNGS 2 (TWO) TIMES DAILY., Disp: 10.2 Inhaler, Rfl: 4 .  busPIRone (BUSPAR) 15 MG tablet, Take 1 tablet (15 mg total) by mouth at bedtime. One half tablet at bedtime for first week, then increase to one whole tablet at bedtime, Disp: 30 tablet, Rfl: 0  Current Facility-Administered Medications:  .  0.9 %  sodium chloride infusion, 500 mL, Intravenous, Once, Danis, Estill Cotta III, MD   Vital signs in last 24 hrs: Vitals:   03/14/17 1039  BP: 100/64  Pulse: 84    Physical Exam  Well-appearing woman, normal vocal quality, good muscle mass  HEENT: sclera anicteric, oral mucosa moist without lesions  Neck: supple, no thyromegaly, JVD or lymphadenopathy  Cardiac: RRR without murmurs, S1S2 heard, no peripheral edema  Pulm: clear to auscultation bilaterally, normal RR and effort noted  Abdomen:  soft, no tenderness, with active bowel sounds. No guarding or palpable hepatosplenomegaly.  Skin; warm and dry, no jaundice or rash   Radiologic studies:  CTAP 04/2015 no structural problems intestine.  Small hiatal hernia   @ASSESSMENTPLANBEGIN @ Assessment: Encounter Diagnoses  Name Primary?  . Abdominal pain, chronic, epigastric Yes  . 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  It is not typical for gastroparesis.   Plan: Discontinue dicyclomine Begin buspirone 15 mg tablet.  Start with 1/2 tablet at  bedtime for a week, then increase to 1 tablet at bedtime.  She is aware of the possible sedating effect of this, which is why it is taken at nighttime.  I have also asked her to call us in several weeks with an update on symptoms.  If not improved, continue to try and increase the dose of that medicine or consider gastric emptying study.  Total time 25 minutes, over half spent in counseling and coordination of care.   Hannah Barron

## 2017-03-16 ENCOUNTER — Encounter (HOSPITAL_COMMUNITY)
Admission: RE | Admit: 2017-03-16 | Discharge: 2017-03-16 | Disposition: A | Discharge status: Home / Self Care | Payer: Medicare HMO | Source: Ambulatory Visit | Attending: Pulmonary Disease | Admitting: Pulmonary Disease

## 2017-03-16 DIAGNOSIS — J479 Bronchiectasis, uncomplicated: Secondary | ICD-10-CM | POA: Diagnosis not present

## 2017-03-16 DIAGNOSIS — G43909 Migraine, unspecified, not intractable, without status migrainosus: Secondary | ICD-10-CM | POA: Insufficient documentation

## 2017-03-16 DIAGNOSIS — J449 Chronic obstructive pulmonary disease, unspecified: Secondary | ICD-10-CM | POA: Insufficient documentation

## 2017-03-16 DIAGNOSIS — K219 Gastro-esophageal reflux disease without esophagitis: Secondary | ICD-10-CM | POA: Diagnosis not present

## 2017-03-16 NOTE — Progress Notes (Signed)
Daily Session Note  Patient Details  Name: Hannah Barron MRN: [id number] Date of Birth: 08/01/1970 Referring Provider:     Pulmonary Rehab Walk Test from 02/21/2017 in [place]  Referring Provider  Dr. Vaughan Browner      Encounter Date: 03/16/2017  Check In: Session Check In - 03/16/17 1049      Check-In   Location  [facility]    Staff Present  Su Hilt, MS, ACSM RCEP, Exercise Physiologist;Portia Rollene Rotunda, RN, BSN;Brea Coleson Ysidro Evert, RN;Joan [employer], RN, BSN    Supervising physician immediately available to respond to emergencies  [facility] immediately available    Physician(s)  Dr. Horris Latino    Medication changes reported      No    Fall or balance concerns reported     No    Tobacco Cessation  No Change    Warm-up and Cool-down  Performed as group-led instruction    Resistance Training Performed  Yes    VAD Patient?  No      Pain Assessment   Currently in Pain?  No/denies    Multiple Pain Sites  No       Capillary Blood Glucose: No results found for this or any previous visit (from the past 24 hour(s)).    Social History   Tobacco Use  Smoking Status Never Smoker  Smokeless Tobacco Never Used    Goals Met:  Exercise tolerated well No report of cardiac concerns or symptoms Strength training completed today  Goals Unmet:  Not Applicable  Comments: Service time is from 1030 to 1215    Dr. Rush Farmer is Medical Director for Pulmonary Rehab at [facility].

## 2017-03-20 NOTE — Progress Notes (Signed)
Pulmonary Individual Treatment Plan  Patient Details  Name: Hannah Barron MRN: [id number] Date of Birth: 16-Jul-1970 Referring Provider:     Pulmonary Rehab Walk Test from 02/21/2017 in [place]  Referring Provider  Dr. Vaughan Browner      Initial Encounter Date:    Pulmonary Rehab Walk Test from 02/21/2017 in [place]  Date  02/21/17  Referring Provider  Dr. Vaughan Browner      Visit Diagnosis: Bronchiectasis without complication ([place])  Patient's Home Medications on Admission:   Current Outpatient Medications:  .  albuterol (VENTOLIN HFA) 108 (90 Base) MCG/ACT inhaler, Inhale 2 puffs into the lungs every 6 (six) hours as needed for wheezing or shortness of breath., Disp: 3 Inhaler, Rfl: 3 .  busPIRone (BUSPAR) 15 MG tablet, Take 1 tablet (15 mg total) by mouth at bedtime. One half tablet at bedtime for first week, then increase to one whole tablet at bedtime, Disp: 30 tablet, Rfl: 0 .  dicyclomine (BENTYL) 20 MG tablet, TAKE 1 TABLET (20 MG TOTAL) BY MOUTH DAILY AS NEEDED FOR SPASMS., Disp: 30 tablet, Rfl: 0 .  esomeprazole (NEXIUM) 20 MG capsule, Take 1 capsule (20 mg total) by mouth 2 (two) times daily. 30 minutes before meals, Disp: 60 capsule, Rfl: 2 .  estradiol (ESTRACE) 0.5 MG tablet, Take 1 tablet (0.5 mg total) by mouth daily., Disp: 30 tablet, Rfl: 6 .  ibuprofen (ADVIL,MOTRIN) 400 MG tablet, Take 1 tablet (400 mg total) by mouth every 6 (six) hours as needed., Disp: 90 tablet, Rfl: 0 .  metoprolol succinate (TOPROL-XL) 50 MG 24 hr tablet, Take 1 tablet ('50mg'$ ) in morning and 1/2 tablet ('25mg'$ ) in evening with or immediately following a meal. (Patient taking differently: Take 25-50 mg by mouth See admin instructions. 50 mg in the morning and 25 mg in the evening), Disp: 135 tablet, Rfl: 1 .  metoprolol tartrate (LOPRESSOR) 25 MG tablet, TAKE 1/2 TABLET BY MOUTH TWICE A DAY, Disp: 90 tablet, Rfl: 3 .  Respiratory Therapy Supplies  (FLUTTER) DEVI, Use as directed., Disp: 1 each, Rfl: 0 .  RESTASIS 0.05 % ophthalmic emulsion, Instill 1 drop into both eyes two times a day, Disp: , Rfl: 2 .  SYMBICORT 160-4.5 MCG/ACT inhaler, INHALE 2 PUFFS INTO THE LUNGS 2 (TWO) TIMES DAILY., Disp: 10.2 Inhaler, Rfl: 4  Current Facility-Administered Medications:  .  0.9 %  sodium chloride infusion, 500 mL, Intravenous, Once, Danis, Kirke Corin, MD  Past Medical History: Past Medical History:  Diagnosis Date  . Anemia   . Bronchiectasis ([place])   . COPD (chronic obstructive pulmonary disease) ([place])   . GERD (gastroesophageal reflux disease)   . Migraines   . Paroxysmal SVT (supraventricular tachycardia) ([place])    a. diagnosed in 11/2015.  . Right leg numbness     Tobacco Use: Social History   Tobacco Use  Smoking Status Never Smoker  Smokeless Tobacco Never Used    Labs: Recent Review Flowsheet Data    Labs for ITP Cardiac and Pulmonary Rehab Latest Ref Rng & Units 06/04/2015 04/28/2016   Cholestrol 0 - 200 mg/dL - 194   LDLCALC 0 - 99 mg/dL - 111(H)   HDL >39.00 mg/dL - 62.00   Trlycerides 0.0 - 149.0 mg/dL - 108.0   Hemoglobin A1c 4.6 - 6.5 % 5.9 -      Capillary Blood Glucose: No results found for: GLUCAP   Pulmonary Assessment Scores: Pulmonary Assessment Scores    Row  Name 02/22/17 1458 02/23/17 0710       ADL UCSD   ADL Phase  Entry  Entry    SOB Score total  55  -      CAT Score   CAT Score  40 Entry  -      mMRC Score   mMRC Score  -  4       Pulmonary Function Assessment: Pulmonary Function Assessment - 02/17/17 1102      Breath   Bilateral Breath Sounds  Clear;Decreased    Shortness of Breath  Limiting activity;Fear of Shortness of Breath;Yes       Exercise Target Goals:    Exercise Program Goal: Individual exercise prescription set using results from initial 6 min walk test and THRR while considering  patient's activity barriers and safety.    Exercise Prescription Goal: Initial  exercise prescription builds to 30-45 minutes a day of aerobic activity, 2-3 days per week.  Home exercise guidelines will be given to patient during program as part of exercise prescription that the participant will acknowledge.  Activity Barriers & Risk Stratification: Activity Barriers & Cardiac Risk Stratification - 02/17/17 1041      Activity Barriers & Cardiac Risk Stratification   Activity Barriers  Deconditioning;Shortness of Breath       6 Minute Walk: 6 Minute Walk    Row Name 02/23/17 0710         6 Minute Walk   Phase  Initial     Distance  1332 feet     Walk Time  6 minutes     # of Rest Breaks  0     MPH  2.52     METS  2.91     RPE  12     Perceived Dyspnea   3     Symptoms  No     Resting HR  71 bpm     Resting BP  138/80     Resting Oxygen Saturation   99 %     Exercise Oxygen Saturation  during 6 min walk  98 %     Max Ex. HR  101 bpm     Max Ex. BP  114/64       Interval HR   1 Minute HR  90     2 Minute HR  91     3 Minute HR  101     4 Minute HR  94     5 Minute HR  101     6 Minute HR  90     2 Minute Post HR  91     Interval Heart Rate?  Yes       Interval Oxygen   Interval Oxygen?  Yes     Baseline Oxygen Saturation %  99 %     1 Minute Oxygen Saturation %  99 %     1 Minute Liters of Oxygen  0 L     2 Minute Oxygen Saturation %  100 %     2 Minute Liters of Oxygen  0 L     3 Minute Oxygen Saturation %  99 %     3 Minute Liters of Oxygen  0 L     4 Minute Oxygen Saturation %  99 %     4 Minute Liters of Oxygen  0 L     5 Minute Oxygen Saturation %  99 %     5 Minute Liters of Oxygen  0 L     6 Minute Oxygen Saturation %  98 %     6 Minute Liters of Oxygen  0 L     2 Minute Post Oxygen Saturation %  99 %     2 Minute Post Liters of Oxygen  0 L        Oxygen Initial Assessment: Oxygen Initial Assessment - 02/23/17 0710      Initial 6 min Walk   Oxygen Used  None      Program Oxygen Prescription   Program Oxygen Prescription   None       Oxygen Re-Evaluation: Oxygen Re-Evaluation    Row Name 03/20/17 1343             Program Oxygen Prescription   Program Oxygen Prescription  None         Home Oxygen   Home Oxygen Device  None       Sleep Oxygen Prescription  None       Home Exercise Oxygen Prescription  None       Home at Rest Exercise Oxygen Prescription  None          Oxygen Discharge (Final Oxygen Re-Evaluation): Oxygen Re-Evaluation - 03/20/17 1343      Program Oxygen Prescription   Program Oxygen Prescription  None      Home Oxygen   Home Oxygen Device  None    Sleep Oxygen Prescription  None    Home Exercise Oxygen Prescription  None    Home at Rest Exercise Oxygen Prescription  None       Initial Exercise Prescription: Initial Exercise Prescription - 02/23/17 0700      Date of Initial Exercise RX and Referring Provider   Date  02/21/17    Referring Provider  Dr. Vaughan Browner      Treadmill   MPH  1.7    Grade  0    Minutes  17      NuStep   Level  2    SPM  80    Minutes  17    METs  1.5      Rower   Level  2    Watts  10    Minutes  17      Prescription Details   Frequency (times per week)  2    Duration  Progress to 45 minutes of aerobic exercise without signs/symptoms of physical distress      Intensity   THRR 40-80% of Max Heartrate  70-139    Ratings of Perceived Exertion  11-13    Perceived Dyspnea  0-4      Progression   Progression  Continue to progress workloads to maintain intensity without signs/symptoms of physical distress.      Resistance Training   Training Prescription  Yes    Weight  orange bands    Reps  10-15       Perform Capillary Blood Glucose checks as needed.  Exercise Prescription Changes: Exercise Prescription Changes    Row Name 02/28/17 1200 03/09/17 1030 03/09/17 1200         Response to Exercise   Blood Pressure (Admit)  104/60  98/58  -     Blood Pressure (Exercise)  138/64  118/70  -     Blood Pressure (Exit)  104/70   110/70  -     Heart Rate (Admit)  73 bpm  72 bpm  -     Heart Rate (Exercise)  98 bpm  100 bpm  -     Heart Rate (Exit)  81 bpm  71 bpm  -     Oxygen Saturation (Admit)  98 %  99 %  -     Oxygen Saturation (Exercise)  98 %  97 %  -     Oxygen Saturation (Exit)  100 %  100 %  -     Rating of Perceived Exertion (Exercise)  15  15  -     Perceived Dyspnea (Exercise)  2  1  -     Duration  Progress to 45 minutes of aerobic exercise without signs/symptoms of physical distress  Progress to 45 minutes of aerobic exercise without signs/symptoms of physical distress  -     Intensity  Other (comment) 40-80% of HRR  THRR unchanged  -       Progression   Progression  Continue to progress workloads to maintain intensity without signs/symptoms of physical distress.  Continue to progress workloads to maintain intensity without signs/symptoms of physical distress.  -       Resistance Training   Training Prescription  Yes  Yes  -     Weight  orange bands  orange bands  -     Reps  10-15  10-15  -     Time  10 Minutes  10 Minutes  -       Treadmill   MPH  2.3  2.5  -     Grade  2  3  -     Minutes  17  17  -       NuStep   Level  2  -  -     Minutes  17  -  -     METs  2.2  -  -       Rower   Level  2  2  -     Minutes  17  17  -       Home Exercise Plan   Plans to continue exercise at  -  -  [employer] [facility] [employer] (comment)     Frequency  -  -  Add 2 additional days to program exercise sessions.        Exercise Comments: Exercise Comments    Row Name 03/09/17 1233           Exercise Comments  Home exercise completed          Exercise Goals and Review: Exercise Goals    Row Name 02/17/17 1042             Exercise Goals   Increase Physical Activity  Yes       Intervention  Provide advice, education, support and counseling about physical activity/exercise needs.;Develop an individualized exercise prescription for aerobic and resistive training based on initial evaluation  findings, risk stratification, comorbidities and participant's personal goals.       Expected Outcomes  Short Term: Attend rehab on a regular basis to increase amount of physical activity.       Increase Strength and Stamina  Yes       Intervention  Provide advice, education, support and counseling about physical activity/exercise needs.;Develop an individualized exercise prescription for aerobic and resistive training based on initial evaluation findings, risk stratification, comorbidities and participant's personal goals.       Expected Outcomes  Short Term: Increase workloads from initial exercise prescription for resistance, speed, and METs.       Able to  understand and use rate of perceived exertion (RPE) scale  Yes       Intervention  Provide education and explanation on how to use RPE scale       Expected Outcomes  Short Term: Able to use RPE daily in rehab to express subjective intensity level;Long Term:  Able to use RPE to guide intensity level when exercising independently       Able to understand and use Dyspnea scale  Yes       Intervention  Provide education and explanation on how to use Dyspnea scale       Expected Outcomes  Short Term: Able to use Dyspnea scale daily in rehab to express subjective sense of shortness of breath during exertion;Long Term: Able to use Dyspnea scale to guide intensity level when exercising independently       Knowledge and understanding of Target Heart Rate Range (THRR)  Yes       Intervention  Provide education and explanation of THRR including how the numbers were predicted and where they are located for reference       Expected Outcomes  Short Term: Able to state/look up THRR       Understanding of Exercise Prescription  Yes       Intervention  Provide education, explanation, and written materials on patient's individual exercise prescription       Expected Outcomes  Short Term: Able to explain program exercise prescription;Long Term: Able to explain  home exercise prescription to exercise independently          Exercise Goals Re-Evaluation : Exercise Goals Re-Evaluation    Row Name 03/20/17 1343             Exercise Goal Re-Evaluation   Exercise Goals Review  Increase Strength and Stamina;Able to understand and use Dyspnea scale;Increase Physical Activity;Able to understand and use rate of perceived exertion (RPE) scale;Knowledge and understanding of Target Heart Rate Range (THRR);Understanding of Exercise Prescription       Comments  Patient is progressing well in rehab. She states that she is already feeling better. Patient's highest avg MET level is 2.6. Is open to workload changes. Exercising at her local [employer] with her husband. Will cont. to monitor and progress as able.        Expected Outcomes  Through exercise at rehab and at home, patient will increase endurance and strength. Patient will also be able to perform ADL's with less shortness of breath and fatigue.          Discharge Exercise Prescription (Final Exercise Prescription Changes): Exercise Prescription Changes - 03/09/17 1200      Home Exercise Plan   Plans to continue exercise at  [facility] (comment)    Frequency  Add 2 additional days to program exercise sessions.       Nutrition:  Target Goals: Understanding of nutrition guidelines, daily intake of sodium '1500mg'$ , cholesterol '200mg'$ , calories 30% from fat and 7% or less from saturated fats, daily to have 5 or more servings of fruits and vegetables.  Biometrics: Pre Biometrics - 02/17/17 1047      Pre Biometrics   Grip Strength  25 kg        Nutrition Therapy Plan and Nutrition Goals: Nutrition Therapy & Goals - 03/03/17 1357      Nutrition Therapy   Diet  General, healthful      Personal Nutrition Goals   Nutrition Goal  Identify food quantities necessary to achieve wt loss of  -2# per  week to a goal wt loss of 2.7-10.9 kg (6-24 lb) at graduation from pulmonary rehab.      Intervention  Plan   Intervention  Prescribe, educate and counsel regarding individualized specific dietary modifications aiming towards targeted core components such as weight, hypertension, lipid management, diabetes, heart failure and other comorbidities.    Expected Outcomes  Short Term Goal: Understand basic principles of dietary content, such as calories, fat, sodium, cholesterol and nutrients.;Long Term Goal: Adherence to prescribed nutrition plan.       Nutrition Assessments: Nutrition Assessments - 03/03/17 1356      Rate Your Plate Scores   Pre Score  -- survey returned incomplete       Nutrition Goals Re-Evaluation:   Nutrition Goals Discharge (Final Nutrition Goals Re-Evaluation):   Psychosocial: Target Goals: Acknowledge presence or absence of significant depression and/or stress, maximize coping skills, provide positive support system. Participant is able to verbalize types and ability to use techniques and skills needed for reducing stress and depression.  Initial Review & Psychosocial Screening: Initial Psych Review & Screening - 02/17/17 1102      Initial Review   Current issues with  Current Stress Concerns;Current Sleep Concerns    Source of Stress Concerns  Chronic Illness;Unable to participate in former interests or hobbies;Unable to perform yard/household activities    Comments  patient states sleep problems my be a result of [place]?  Yes    Comments  no friends in area. All family back in [place]. Ex husband still friend. New husband of 3 years supportive and daughter at college      Barriers   Psychosocial barriers to participate in program  There are no identifiable barriers or psychosocial needs.      Screening Interventions   Interventions  Encouraged to exercise       Quality of Life Scores:  Scores of 19 and below usually indicate a poorer quality of life in these areas.  A difference of  2-3 points is a  clinically meaningful difference.  A difference of 2-3 points in the total score of the Quality of Life Index has been associated with significant improvement in overall quality of life, self-image, physical symptoms, and general health in studies assessing change in quality of life.   PHQ-9: Recent Review Flowsheet Data    Depression screen [facility] 2/9 02/17/2017 12/29/2016 08/23/2016 04/21/2016 04/01/2016   Decreased Interest 0 1 - 3 3   Down, Depressed, Hopeless 0 '2 1 2 '$ 0   PHQ - 2 Score 0 '3 1 5 3   '$ Altered sleeping - 0 0 0 3   Tired, decreased energy - '3 2 2 2   '$ Change in appetite - 3 0 3 3   Feeling bad or failure about yourself  - 0 0 0 0   Trouble concentrating - 2 0 0 2   Moving slowly or fidgety/restless - - 0 0 0   Suicidal thoughts - 0 0 0 0   PHQ-9 Score - '11 3 10 13     '$ Interpretation of Total Score  Total Score Depression Severity:  1-4 = Minimal depression, 5-9 = Mild depression, 10-14 = Moderate depression, 15-19 = Moderately severe depression, 20-27 = Severe depression   Psychosocial Evaluation and Intervention: Psychosocial Evaluation - 02/17/17 1104      Psychosocial Evaluation & Interventions   Interventions  Encouraged to exercise with the program and follow exercise prescription  Expected Outcomes  patient will remain free from psychosocial barriers to participation       Psychosocial Re-Evaluation: Psychosocial Re-Evaluation    [place] Name 03/20/17 0857             Psychosocial Re-Evaluation   Current issues with  None Identified       Expected Outcomes  patient will remain free from psychosocial barriers to participation in pulmonary rehab       Interventions  Encouraged to attend Pulmonary Rehabilitation for the exercise       Continue Psychosocial Services   No Follow up required          Psychosocial Discharge (Final Psychosocial Re-Evaluation): Psychosocial Re-Evaluation - 03/20/17 0857      Psychosocial Re-Evaluation   Current issues with  None  Identified    Expected Outcomes  patient will remain free from psychosocial barriers to participation in pulmonary rehab    Interventions  Encouraged to attend Pulmonary Rehabilitation for the exercise    Continue Psychosocial Services   No Follow up required       Education: Education Goals: Education classes will be provided on a weekly basis, covering required topics. Participant will state understanding/return demonstration of topics presented.  Learning Barriers/Preferences: Learning Barriers/Preferences - 02/17/17 1101      Learning Barriers/Preferences   Learning Barriers  Language    Learning Preferences  Individual Instruction;Verbal Instruction       Education Topics: Risk Factor Reduction:  -Group instruction that is supported by a PowerPoint presentation. Instructor discusses the definition of a risk factor, different risk factors for pulmonary disease, and how the heart and lungs work together.     Nutrition for Pulmonary Patient:  -Group instruction provided by PowerPoint slides, verbal discussion, and written materials to support subject matter. The instructor gives an explanation and review of healthy diet recommendations, which includes a discussion on weight management, recommendations for fruit and vegetable consumption, as well as protein, fluid, caffeine, fiber, sodium, sugar, and alcohol. Tips for eating when patients are short of breath are discussed.   Pursed Lip Breathing:  -Group instruction that is supported by demonstration and informational handouts. Instructor discusses the benefits of pursed lip and diaphragmatic breathing and detailed demonstration on how to preform both.     Oxygen Safety:  -Group instruction provided by PowerPoint, verbal discussion, and written material to support subject matter. There is an overview of "What is Oxygen" and "Why do we need it".  Instructor also reviews how to create a safe environment for oxygen use, the importance  of using oxygen as prescribed, and the risks of noncompliance. There is a brief discussion on traveling with oxygen and resources the patient may utilize.   Oxygen Equipment:  -Group instruction provided by [facility] Staff utilizing handouts, written materials, and equipment demonstrations.   Signs and Symptoms:  -Group instruction provided by written material and verbal discussion to support subject matter. Warning signs and symptoms of infection, stroke, and heart attack are reviewed and when to call the physician/911 reinforced. Tips for preventing the spread of infection discussed.   PULMONARY REHAB OTHER RESPIRATORY from 03/16/2017 in [place]  Date  03/09/17  Educator  RN  Instruction Review Code  1- Verbalizes Understanding      Advanced Directives:  -Group instruction provided by verbal instruction and written material to support subject matter. Instructor reviews Advanced Directive laws and proper instruction for filling out document.   Pulmonary Video:  -Group video education  that reviews the importance of medication and oxygen compliance, exercise, good nutrition, pulmonary hygiene, and pursed lip and diaphragmatic breathing for the pulmonary patient.   Exercise for the Pulmonary Patient:  -Group instruction that is supported by a PowerPoint presentation. Instructor discusses benefits of exercise, core components of exercise, frequency, duration, and intensity of an exercise routine, importance of utilizing pulse oximetry during exercise, safety while exercising, and options of places to exercise outside of rehab.     PULMONARY REHAB OTHER RESPIRATORY from 03/16/2017 in [place]  Date  03/02/17  Educator  Cloyde Reams  Instruction Review Code  2- Demonstrated Understanding      Pulmonary Medications:  -Verbally interactive group education provided by instructor with focus on inhaled medications and proper  administration.   Anatomy and Physiology of the Respiratory System and Intimacy:  -Group instruction provided by PowerPoint, verbal discussion, and written material to support subject matter. Instructor reviews respiratory cycle and anatomical components of the respiratory system and their functions. Instructor also reviews differences in obstructive and restrictive respiratory diseases with examples of each. Intimacy, Sex, and Sexuality differences are reviewed with a discussion on how relationships can change when diagnosed with pulmonary disease. Common sexual concerns are reviewed.   MD DAY -A group question and answer session with a medical doctor that allows participants to ask questions that relate to their pulmonary disease state.   OTHER EDUCATION -Group or individual verbal, written, or video instructions that support the educational goals of the pulmonary rehab program.   Holiday Eating Survival Tips:  -Group instruction provided by PowerPoint slides, verbal discussion, and written materials to support subject matter. The instructor gives patients tips, tricks, and techniques to help them not only survive but enjoy the holidays despite the onslaught of food that accompanies the holidays.   Knowledge Questionnaire Score: Knowledge Questionnaire Score - 02/22/17 1457      Knowledge Questionnaire Score   Pre Score  12/13 Staff assisted pt with test       Core Components/Risk Factors/Patient Goals at Admission: Personal Goals and Risk Factors at Admission - 02/17/17 1102      Core Components/Risk Factors/Patient Goals on Admission   Improve shortness of breath with ADL's  Yes    Intervention  Provide education, individualized exercise plan and daily activity instruction to help decrease symptoms of SOB with activities of daily living.    Expected Outcomes  Short Term: Improve cardiorespiratory fitness to achieve a reduction of symptoms when performing ADLs;Long Term: Be able  to perform more ADLs without symptoms or delay the onset of symptoms       Core Components/Risk Factors/Patient Goals Review:  Goals and Risk Factor Review    Row Name 03/17/17 1551             Core Components/Risk Factors/Patient Goals Review   Personal Goals Review  Improve shortness of breath with ADL's;Develop more efficient breathing techniques such as purse lipped breathing and diaphragmatic breathing and practicing self-pacing with activity.       Review  patient is doing well in pulmonary rehab. she has attended 5 sessions since her admission. she states she is beginning to see an improvement in her shortness of breath during her exercise. she continues to be encouraged  to use plb during exertions.       Expected Outcomes  see admission expected outcomes          Core Components/Risk Factors/Patient Goals at Discharge (Final Review):  Goals and Risk  Factor Review - 03/17/17 1551      Core Components/Risk Factors/Patient Goals Review   Personal Goals Review  Improve shortness of breath with ADL's;Develop more efficient breathing techniques such as purse lipped breathing and diaphragmatic breathing and practicing self-pacing with activity.    Review  patient is doing well in pulmonary rehab. she has attended 5 sessions since her admission. she states she is beginning to see an improvement in her shortness of breath during her exercise. she continues to be encouraged  to use plb during exertions.    Expected Outcomes  see admission expected outcomes       ITP Comments:   Comments: Patient has attended 5 sessions since admission to pulmonary rehab.

## 2017-03-21 ENCOUNTER — Encounter (HOSPITAL_COMMUNITY)
Admission: RE | Admit: 2017-03-21 | Discharge: 2017-03-21 | Disposition: A | Discharge status: Home / Self Care | Payer: Medicare HMO | Source: Ambulatory Visit | Attending: Pulmonary Disease | Admitting: Pulmonary Disease

## 2017-03-21 DIAGNOSIS — G43909 Migraine, unspecified, not intractable, without status migrainosus: Secondary | ICD-10-CM | POA: Diagnosis not present

## 2017-03-21 DIAGNOSIS — J449 Chronic obstructive pulmonary disease, unspecified: Secondary | ICD-10-CM | POA: Diagnosis not present

## 2017-03-21 DIAGNOSIS — K219 Gastro-esophageal reflux disease without esophagitis: Secondary | ICD-10-CM | POA: Diagnosis not present

## 2017-03-21 DIAGNOSIS — J479 Bronchiectasis, uncomplicated: Secondary | ICD-10-CM

## 2017-03-21 NOTE — Progress Notes (Signed)
Daily Session Note  Patient Details  Name: Hannah Barron MRN: [id number] Date of Birth: 02-05-1970 Referring Provider:     Pulmonary Rehab Walk Test from 02/21/2017 in [place]  Referring Provider  Dr. Vaughan Browner      Encounter Date: 03/21/2017  Check In: Session Check In - 03/21/17 1241      Check-In   Location  [facility]    Staff Present  Su Hilt, MS, ACSM RCEP, Exercise Physiologist;Portia Rollene Rotunda, RN, Roque Cash, RN    Supervising physician immediately available to respond to emergencies  [facility] immediately available    Physician(s)  Dr. Horris Latino    Medication changes reported      No    Fall or balance concerns reported     No    Tobacco Cessation  No Change    Warm-up and Cool-down  Performed as group-led instruction    Resistance Training Performed  Yes    VAD Patient?  No      Pain Assessment   Currently in Pain?  No/denies    Multiple Pain Sites  No       Capillary Blood Glucose: No results found for this or any previous visit (from the past 24 hour(s)).    Social History   Tobacco Use  Smoking Status Never Smoker  Smokeless Tobacco Never Used    Goals Met:  Exercise tolerated well No report of cardiac concerns or symptoms Strength training completed today  Goals Unmet:  Not Applicable  Comments: Service time is from 1030 to 1230    Dr. Rush Farmer is Medical Director for Pulmonary Rehab at [facility].

## 2017-03-21 NOTE — Progress Notes (Signed)
Hannah Barron 47 y.o. female  DOB: 1970-11-04 MRN: [id number]           Nutrition Brief Note 1. Bronchiectasis without complication ([place])   Note Spoke with pt. Pt is overweight. There are some ways the pt can make her eating habits healthier, which were discussed when pt's Rate Your Plate reviewed.  Pt wants to lose wt but c/o life long decreased appetite. Pt's husband helps with grocery shopping and pt's friend cooks 2 meals/week for her family. Pt expressed understanding of the information reviewed.  Nutrition Diagnosis ? Food-and nutrition-related knowledge deficit related to lack of exposure to information as related to diagnosis of pulmonary disease ? Overweight related to excessive energy intake as evidenced by a BMI of 28.34  Nutrition Intervention ? Handout given for 5-day menu ideas ? Pt's individual nutrition plan and goals reviewed with pt. ? Benefits of adopting healthy eating habits discussed when pt's Rate Your Plate reviewed.  Goal(s) 1. Identify food quantities necessary to achieve wt loss of  -2# per week to a goal wt loss of 2.7-10.9 kg (6-24 lb) at graduation from pulmonary rehab.  Plan:  Pt to attend Pulmonary Nutrition class Will provide client-centered nutrition education as part of interdisciplinary care.   Monitor and evaluate progress toward nutrition goal with team.  Monitor and Evaluate progress toward nutrition goal with team.   Derek Mound, M.Ed, RD, LDN, CDE 03/21/2017 3:28 PM

## 2017-03-22 ENCOUNTER — Other Ambulatory Visit: Payer: Self-pay | Admitting: Nurse Practitioner

## 2017-03-23 ENCOUNTER — Encounter (HOSPITAL_COMMUNITY)
Admission: RE | Admit: 2017-03-23 | Discharge: 2017-03-23 | Disposition: A | Discharge status: Home / Self Care | Payer: Medicare HMO | Source: Ambulatory Visit | Attending: Pulmonary Disease | Admitting: Pulmonary Disease

## 2017-03-23 DIAGNOSIS — J479 Bronchiectasis, uncomplicated: Secondary | ICD-10-CM

## 2017-03-25 ENCOUNTER — Other Ambulatory Visit: Payer: Self-pay | Admitting: Nurse Practitioner

## 2017-03-25 ENCOUNTER — Ambulatory Visit (HOSPITAL_COMMUNITY)
Admission: EM | Admit: 2017-03-25 | Discharge: 2017-03-25 | Disposition: A | Discharge status: Home / Self Care | Payer: Medicare HMO | Attending: Family Medicine | Admitting: Family Medicine

## 2017-03-25 ENCOUNTER — Encounter (HOSPITAL_COMMUNITY): Payer: Self-pay | Admitting: Family Medicine

## 2017-03-25 ENCOUNTER — Other Ambulatory Visit: Payer: Self-pay | Admitting: Acute Care

## 2017-03-25 DIAGNOSIS — K2100 Gastro-esophageal reflux disease with esophagitis, without bleeding: Secondary | ICD-10-CM

## 2017-03-25 DIAGNOSIS — H1032 Unspecified acute conjunctivitis, left eye: Secondary | ICD-10-CM | POA: Diagnosis not present

## 2017-03-25 MED ORDER — POLYMYXIN B-TRIMETHOPRIM 10000-0.1 UNIT/ML-% OP SOLN: 1.0000 [drp] | OPHTHALMIC | 0 refills | Status: DC

## 2017-03-25 NOTE — ED Provider Notes (Signed)
[place]   [id number] 03/25/17 Arrival Time: 1805   SUBJECTIVE:  Hannah Barron is a 47 y.o. female who presents to the urgent care with complaint of left eye irritation, eye pain, and redness for 2 days.  Past Medical History:  Diagnosis Date  . Anemia   . Bronchiectasis ([place])   . COPD (chronic obstructive pulmonary disease) ([place])   . GERD (gastroesophageal reflux disease)   . Migraines   . Paroxysmal SVT (supraventricular tachycardia) ([place])    a. diagnosed in 11/2015.  . Right leg numbness    Family History  Problem Relation Age of Onset  . Healthy Mother   . Healthy Father    Social History   Socioeconomic History  . Marital status: Married    Spouse name: Not on file  . Number of children: 1  . Years of education: 53  . Highest education level: Not on file  Social Needs  . Financial resource strain: Not on file  . Food insecurity - worry: Not on file  . Food insecurity - inability: Not on file  . Transportation needs - medical: Not on file  . Transportation needs - non-medical: Not on file  Occupational History  . Occupation: disability  Tobacco Use  . Smoking status: Never Smoker  . Smokeless tobacco: Never Used  Substance and Sexual Activity  . Alcohol use: No    Alcohol/week: 0.0 oz  . Drug use: No  . Sexual activity: Yes  Other Topics Concern  . Not on file  Social History Narrative   Fun: Watch TV   Current Facility-Administered Medications for the 03/25/17 encounter [facility] Encounter)  Medication  . 0.9 %  sodium chloride infusion   Current Meds  Medication Sig  . albuterol (VENTOLIN HFA) 108 (90 Base) MCG/ACT inhaler Inhale 2 puffs into the lungs every 6 (six) hours as needed for wheezing or shortness of breath.  . busPIRone (BUSPAR) 15 MG tablet Take 1 tablet (15 mg total) by mouth at bedtime. One half tablet at bedtime for first week, then increase to one whole tablet at bedtime  . dicyclomine (BENTYL) 20 MG tablet TAKE 1 TABLET  (20 MG TOTAL) BY MOUTH DAILY AS NEEDED FOR SPASMS.  Marland Kitchen esomeprazole (NEXIUM) 20 MG capsule Take 1 capsule (20 mg total) by mouth 2 (two) times daily. 30 minutes before meals  . estradiol (ESTRACE) 0.5 MG tablet Take 1 tablet (0.5 mg total) by mouth daily.  Marland Kitchen ibuprofen (ADVIL,MOTRIN) 400 MG tablet Take 1 tablet (400 mg total) by mouth every 6 (six) hours as needed.  . metoprolol succinate (TOPROL-XL) 50 MG 24 hr tablet Take 1 tablet (50mg ) in morning and 1/2 tablet (25mg ) in evening with or immediately following a meal. (Patient taking differently: Take 25-50 mg by mouth See admin instructions. 50 mg in the morning and 25 mg in the evening)  . metoprolol tartrate (LOPRESSOR) 25 MG tablet TAKE 1/2 TABLET BY MOUTH TWICE A DAY  . Respiratory Therapy Supplies (FLUTTER) DEVI Use as directed.  . RESTASIS 0.05 % ophthalmic emulsion Instill 1 drop into both eyes two times a day  . SYMBICORT 160-4.5 MCG/ACT inhaler INHALE 2 PUFFS INTO THE LUNGS 2 (TWO) TIMES DAILY.   No Known Allergies    ROS: As per HPI, remainder of ROS negative.   OBJECTIVE:   Vitals:   03/25/17 1909  BP: 109/63  Pulse: 81  Resp: 16  Temp: 97.8 F (36.6 C)  TempSrc: Oral  SpO2: 99%  General appearance: alert; no distress Eyes: PERRL; EOMI; conjunctiva red on left, normal left funs HENT: normocephalic; atraumatic; ; oral mucosa normal Neck: supple Back: no CVA tenderness Extremities: no cyanosis or edema; symmetrical with no gross deformities Skin: warm and dry; multiple hyperpigmented macules on face Neurologic: normal gait; grossly normal Psychological: alert and cooperative; normal mood and affect      Labs:  Results for orders placed or performed in visit on 12/28/16  TSH  Result Value Ref Range   TSH 0.92 0.35 - 4.50 uIU/mL  Comprehensive metabolic panel  Result Value Ref Range   Sodium 138 135 - 145 mEq/L   Potassium 4.0 3.5 - 5.1 mEq/L   Chloride 104 96 - 112 mEq/L   CO2 27 19 - 32 mEq/L    Glucose, Bld 87 70 - 99 mg/dL   BUN 11 6 - 23 mg/dL   Creatinine, Ser 0.74 0.40 - 1.20 mg/dL   Total Bilirubin 0.2 0.2 - 1.2 mg/dL   Alkaline Phosphatase 68 39 - 117 U/L   AST 15 0 - 37 U/L   ALT 13 0 - 35 U/L   Total Protein 7.7 6.0 - 8.3 g/dL   Albumin 3.9 3.5 - 5.2 g/dL   Calcium 9.1 8.4 - 10.5 mg/dL   GFR 89.49 >60.00 mL/min  CBC  Result Value Ref Range   WBC 6.8 4.0 - 10.5 K/uL   RBC 3.95 3.87 - 5.11 Mil/uL   Platelets 286.0 150.0 - 400.0 K/uL   Hemoglobin 12.5 12.0 - 15.0 g/dL   HCT 38.1 36.0 - 46.0 %   MCV 96.4 78.0 - 100.0 fl   MCHC 32.9 30.0 - 36.0 g/dL   RDW 13.2 11.5 - 15.5 %    Labs Reviewed - No data to display  No results found.     ASSESSMENT & PLAN:  1. Acute bacterial conjunctivitis of left eye     Meds ordered this encounter  Medications  . trimethoprim-polymyxin b (POLYTRIM) ophthalmic solution    Sig: Place 1 drop into the left eye every 4 (four) hours.    Dispense:  10 mL    Refill:  0    Reviewed expectations re: course of current medical issues. Questions answered. Outlined signs and symptoms indicating need for more acute intervention. Patient verbalized understanding. After Visit Summary given.    Procedures:      Robyn Haber, MD 03/25/17 [phone]

## 2017-03-25 NOTE — ED Triage Notes (Signed)
Pt presents today with left eye irritation that started this morning when she woke up. States through out the day it has gotten worse. States her eye burns and it is very painful.

## 2017-03-27 DIAGNOSIS — H20012 Primary iridocyclitis, left eye: Secondary | ICD-10-CM | POA: Diagnosis not present

## 2017-03-28 ENCOUNTER — Encounter (HOSPITAL_COMMUNITY)
Admission: RE | Admit: 2017-03-28 | Discharge: 2017-03-28 | Disposition: A | Discharge status: Home / Self Care | Payer: Medicare HMO | Source: Ambulatory Visit | Attending: Pulmonary Disease | Admitting: Pulmonary Disease

## 2017-03-28 DIAGNOSIS — J479 Bronchiectasis, uncomplicated: Secondary | ICD-10-CM | POA: Diagnosis not present

## 2017-03-28 DIAGNOSIS — G43909 Migraine, unspecified, not intractable, without status migrainosus: Secondary | ICD-10-CM | POA: Diagnosis not present

## 2017-03-28 DIAGNOSIS — J449 Chronic obstructive pulmonary disease, unspecified: Secondary | ICD-10-CM | POA: Diagnosis not present

## 2017-03-28 DIAGNOSIS — K219 Gastro-esophageal reflux disease without esophagitis: Secondary | ICD-10-CM | POA: Diagnosis not present

## 2017-03-28 NOTE — Progress Notes (Signed)
Daily Session Note  Patient Details  Name: Hannah Barron MRN: [id number] Date of Birth: 04/16/1970 Referring Provider:     Pulmonary Rehab Walk Test from 02/21/2017 in [facility]  Referring Provider  Dr. Mannam      Encounter Date: 03/28/2017  Check In: Session Check In - 03/28/17 1030      Check-In   Location  [facility]    Staff Present  Joan Behrens, RN, BSN;Molly diVincenzo, MS, ACSM RCEP, Exercise Physiologist;Annedrea Stackhouse, RN, MHA;Portia Payne, RN, BSN    Supervising physician immediately available to respond to emergencies  [facility] immediately available    Physician(s)  Dr. Vega    Medication changes reported      No    Fall or balance concerns reported     No    Tobacco Cessation  No Change    Warm-up and Cool-down  Performed as group-led instruction    Resistance Training Performed  Yes    VAD Patient?  No      Pain Assessment   Currently in Pain?  No/denies    Multiple Pain Sites  No       Capillary Blood Glucose: No results found for this or any previous visit (from the past 24 hour(s)).    Social History   Tobacco Use  Smoking Status Never Smoker  Smokeless Tobacco Never Used    Goals Met:  Exercise tolerated well Strength training completed today  Goals Unmet:  Not Applicable  Comments: Service time is from 1030 to 1200    Dr. Wesam G. Yacoub is Medical Director for Pulmonary Rehab at [facility]. 

## 2017-03-30 ENCOUNTER — Encounter (HOSPITAL_COMMUNITY)
Admission: RE | Admit: 2017-03-30 | Discharge: 2017-03-30 | Disposition: A | Discharge status: Home / Self Care | Payer: Medicare HMO | Source: Ambulatory Visit | Attending: Pulmonary Disease | Admitting: Pulmonary Disease

## 2017-03-30 VITALS — Wt 176.4 lb

## 2017-03-30 DIAGNOSIS — K219 Gastro-esophageal reflux disease without esophagitis: Secondary | ICD-10-CM | POA: Diagnosis not present

## 2017-03-30 DIAGNOSIS — J479 Bronchiectasis, uncomplicated: Secondary | ICD-10-CM | POA: Diagnosis not present

## 2017-03-30 DIAGNOSIS — J449 Chronic obstructive pulmonary disease, unspecified: Secondary | ICD-10-CM | POA: Diagnosis not present

## 2017-03-30 DIAGNOSIS — G43909 Migraine, unspecified, not intractable, without status migrainosus: Secondary | ICD-10-CM | POA: Diagnosis not present

## 2017-03-30 NOTE — Progress Notes (Signed)
Daily Session Note  Patient Details  Name: Hannah Barron MRN: [id number] Date of Birth: 1970/09/11 Referring Provider:     Pulmonary Rehab Walk Test from 02/21/2017 in [place]  Referring Provider  Dr. Vaughan Browner      Encounter Date: 03/30/2017  Check In: Session Check In - 03/30/17 1030      Check-In   Location  [facility]    Staff Present  Rosebud Poles, RN, BSN;Sherly Brodbeck, MS, ACSM RCEP, Exercise Physiologist;Lisa Ysidro Evert, RN;Portia Rollene Rotunda, RN, BSN    Supervising physician immediately available to respond to emergencies  [facility] immediately available    Physician(s)  Dr. Lonny Prude    Medication changes reported      No    Fall or balance concerns reported     No    Tobacco Cessation  No Change    Warm-up and Cool-down  Performed as group-led instruction    Resistance Training Performed  Yes    VAD Patient?  No      Pain Assessment   Currently in Pain?  No/denies    Multiple Pain Sites  No       Capillary Blood Glucose: No results found for this or any previous visit (from the past 24 hour(s)).  Exercise Prescription Changes - 03/30/17 1400      Response to Exercise   Blood Pressure (Admit)  94/64    Blood Pressure (Exercise)  120/70    Blood Pressure (Exit)  100/62    Heart Rate (Admit)  79 bpm    Heart Rate (Exercise)  118 bpm    Heart Rate (Exit)  92 bpm    Oxygen Saturation (Admit)  98 %    Oxygen Saturation (Exercise)  99 %    Oxygen Saturation (Exit)  100 %    Rating of Perceived Exertion (Exercise)  13    Perceived Dyspnea (Exercise)  1    Duration  Progress to 45 minutes of aerobic exercise without signs/symptoms of physical distress    Intensity  THRR unchanged      Progression   Progression  Continue to progress workloads to maintain intensity without signs/symptoms of physical distress.      Resistance Training   Training Prescription  Yes    Weight  orange bands    Reps  10-15    Time  10  Minutes      Treadmill   MPH  3    Grade  3    Minutes  17      NuStep   Level  4    SPM  80    Minutes  17    METs  2.1       Social History   Tobacco Use  Smoking Status Never Smoker  Smokeless Tobacco Never Used    Goals Met:  Exercise tolerated well No report of cardiac concerns or symptoms Strength training completed today  Goals Unmet:  Not Applicable  Comments: Service time is from 10:30a to 12:45p    Dr. Rush Farmer is Medical Director for Pulmonary Rehab at [facility].

## 2017-04-03 DIAGNOSIS — H20012 Primary iridocyclitis, left eye: Secondary | ICD-10-CM | POA: Diagnosis not present

## 2017-04-04 ENCOUNTER — Encounter (HOSPITAL_COMMUNITY)
Admission: RE | Admit: 2017-04-04 | Discharge: 2017-04-04 | Disposition: A | Discharge status: Home / Self Care | Payer: Medicare HMO | Source: Ambulatory Visit | Attending: Pulmonary Disease | Admitting: Pulmonary Disease

## 2017-04-04 DIAGNOSIS — J479 Bronchiectasis, uncomplicated: Secondary | ICD-10-CM

## 2017-04-04 DIAGNOSIS — K219 Gastro-esophageal reflux disease without esophagitis: Secondary | ICD-10-CM | POA: Diagnosis not present

## 2017-04-04 DIAGNOSIS — J449 Chronic obstructive pulmonary disease, unspecified: Secondary | ICD-10-CM | POA: Diagnosis not present

## 2017-04-04 DIAGNOSIS — G43909 Migraine, unspecified, not intractable, without status migrainosus: Secondary | ICD-10-CM | POA: Diagnosis not present

## 2017-04-04 NOTE — Progress Notes (Signed)
Daily Session Note  Patient Details  Name: Hannah Barron MRN: [id number] Date of Birth: Apr 22, 1970 Referring Provider:     Pulmonary Rehab Walk Test from 02/21/2017 in [place]  Referring Provider  Dr. Vaughan Browner      Encounter Date: 04/04/2017  Check In: Session Check In - 04/04/17 1214      Check-In   Location  [facility]    Staff Present  Su Hilt, MS, ACSM RCEP, Exercise Physiologist;Lisa Ysidro Evert, RN;Ericka Marcellus Rollene Rotunda, RN, BSN    Supervising physician immediately available to respond to emergencies  [facility] immediately available    Physician(s)  Dr. Lonny Prude    Medication changes reported      No    Fall or balance concerns reported     No    Tobacco Cessation  No Change    Warm-up and Cool-down  Performed as group-led instruction    Resistance Training Performed  Yes    VAD Patient?  No      Pain Assessment   Currently in Pain?  No/denies    Multiple Pain Sites  No       Capillary Blood Glucose: No results found for this or any previous visit (from the past 24 hour(s)).    Social History   Tobacco Use  Smoking Status Never Smoker  Smokeless Tobacco Never Used    Goals Met:  Improved SOB with ADL's Using PLB without cueing & demonstrates good technique Exercise tolerated well No report of cardiac concerns or symptoms Strength training completed today  Goals Unmet:  Not Applicable  Comments: Service time is from 1030 to 1205   Dr. Rush Farmer is Medical Director for Pulmonary Rehab at [facility].

## 2017-04-06 ENCOUNTER — Encounter (HOSPITAL_COMMUNITY)
Admission: RE | Admit: 2017-04-06 | Discharge: 2017-04-06 | Disposition: A | Discharge status: Home / Self Care | Payer: Medicare HMO | Source: Ambulatory Visit | Attending: Pulmonary Disease | Admitting: Pulmonary Disease

## 2017-04-06 DIAGNOSIS — J449 Chronic obstructive pulmonary disease, unspecified: Secondary | ICD-10-CM | POA: Diagnosis not present

## 2017-04-06 DIAGNOSIS — K219 Gastro-esophageal reflux disease without esophagitis: Secondary | ICD-10-CM | POA: Diagnosis not present

## 2017-04-06 DIAGNOSIS — G43909 Migraine, unspecified, not intractable, without status migrainosus: Secondary | ICD-10-CM | POA: Diagnosis not present

## 2017-04-06 DIAGNOSIS — J479 Bronchiectasis, uncomplicated: Secondary | ICD-10-CM | POA: Diagnosis not present

## 2017-04-06 NOTE — Progress Notes (Signed)
Daily Session Note  Patient Details  Name: Hannah Barron MRN: [id number] Date of Birth: May 04, 1970 Referring Provider:     Pulmonary Rehab Walk Test from 02/21/2017 in [place]  Referring Provider  Dr. Vaughan Browner      Encounter Date: 04/06/2017  Check In: Session Check In - 04/06/17 1233      Check-In   Location  [facility]    Staff Present  Su Hilt, MS, ACSM RCEP, Exercise Physiologist;Lisa Ysidro Evert, RN;Cordney Barstow Rollene Rotunda, RN, BSN    Supervising physician immediately available to respond to emergencies  [facility] immediately available    Physician(s)  Dr. Eliseo Squires    Medication changes reported      No    Fall or balance concerns reported     No    Tobacco Cessation  No Change    Warm-up and Cool-down  Performed as group-led instruction    Resistance Training Performed  Yes    VAD Patient?  No      Pain Assessment   Currently in Pain?  No/denies    Multiple Pain Sites  No       Capillary Blood Glucose: No results found for this or any previous visit (from the past 24 hour(s)).    Social History   Tobacco Use  Smoking Status Never Smoker  Smokeless Tobacco Never Used    Goals Met:  Using PLB without cueing & demonstrates good technique Exercise tolerated well Personal goals reviewed No report of cardiac concerns or symptoms Strength training completed today  Goals Unmet:  Not Applicable  Comments: Service time is from 1030 to 1215   Dr. Rush Farmer is Medical Director for Pulmonary Rehab at [facility].

## 2017-04-06 NOTE — Progress Notes (Signed)
Hannah Barron 47 y.o. female  DOB: [birth year] MRN: [id number]           Nutrition Note Spoke with pt. Pt concerned if she's losing weight too fast. Pt has lost 2.1 kg (4.6 lb) over the past month. Desired rate of wt loss discussed. Pt expressed understanding of the information reviewed.  Nutrition Diagnosis ? Food-and nutrition-related knowledge deficit related to lack of exposure to information as related to diagnosis of pulmonary disease ? Overweight related to excessive energy intake as evidenced by a BMI of 28.34  Nutrition Intervention ? Pt's individual nutrition plan and goals reviewed with pt.  Goal(s) 1. Identify food quantities necessary to achieve wt loss of  -2# per week to a goal wt loss of 2.7-10.9 kg (6-24 lb) at graduation from pulmonary rehab.  Plan:  Pt to attend Pulmonary Nutrition class Will provide client-centered nutrition education as part of interdisciplinary care.   Monitor and evaluate progress toward nutrition goal with team.  Monitor and Evaluate progress toward nutrition goal with team.   Derek Mound, M.Ed, RD, LDN, CDE 04/06/2017 12:26 PM

## 2017-04-11 ENCOUNTER — Encounter (HOSPITAL_COMMUNITY)
Admission: RE | Admit: 2017-04-11 | Discharge: 2017-04-11 | Disposition: A | Discharge status: Home / Self Care | Payer: Medicare HMO | Source: Ambulatory Visit | Attending: Pulmonary Disease | Admitting: Pulmonary Disease

## 2017-04-11 VITALS — Wt 175.0 lb

## 2017-04-11 DIAGNOSIS — K219 Gastro-esophageal reflux disease without esophagitis: Secondary | ICD-10-CM | POA: Insufficient documentation

## 2017-04-11 DIAGNOSIS — J449 Chronic obstructive pulmonary disease, unspecified: Secondary | ICD-10-CM | POA: Insufficient documentation

## 2017-04-11 DIAGNOSIS — J479 Bronchiectasis, uncomplicated: Secondary | ICD-10-CM | POA: Diagnosis not present

## 2017-04-11 DIAGNOSIS — G43909 Migraine, unspecified, not intractable, without status migrainosus: Secondary | ICD-10-CM | POA: Insufficient documentation

## 2017-04-11 NOTE — Progress Notes (Signed)
Daily Session Note  Patient Details  Name: Hannah Barron MRN: [id number] Date of Birth: 03/02/1970 Referring Provider:     Pulmonary Rehab Walk Test from 02/21/2017 in [facility]  Referring Provider  Dr. Mannam      Encounter Date: 04/11/2017  Check In: Session Check In - 04/11/17 1214      Check-In   Staff Present  Molly diVincenzo, MS, ACSM RCEP, Exercise Physiologist;Lisa Hughes, RN;Portia Payne, RN, BSN    Supervising physician immediately available to respond to emergencies  [facility] immediately available    Physician(s)  Dr. Patel    Medication changes reported      No    Fall or balance concerns reported     No    Tobacco Cessation  No Change    Warm-up and Cool-down  Performed as group-led instruction    Resistance Training Performed  Yes    VAD Patient?  No      Pain Assessment   Currently in Pain?  No/denies    Multiple Pain Sites  No       Capillary Blood Glucose: No results found for this or any previous visit (from the past 24 hour(s)).  Exercise Prescription Changes - 04/11/17 1200      Response to Exercise   Blood Pressure (Admit)  108/68    Blood Pressure (Exercise)  120/70    Blood Pressure (Exit)  100/60    Heart Rate (Admit)  90 bpm    Heart Rate (Exercise)  114 bpm    Heart Rate (Exit)  84 bpm    Oxygen Saturation (Admit)  99 %    Oxygen Saturation (Exercise)  99 %    Oxygen Saturation (Exit)  99 %    Rating of Perceived Exertion (Exercise)  15    Perceived Dyspnea (Exercise)  2    Duration  Progress to 45 minutes of aerobic exercise without signs/symptoms of physical distress    Intensity  THRR unchanged      Progression   Progression  Continue to progress workloads to maintain intensity without signs/symptoms of physical distress.      Resistance Training   Training Prescription  Yes    Weight  orange bands    Reps  10-15    Time  10 Minutes      Treadmill   MPH  3.2    Grade  3    Minutes  17      NuStep   Level  5    Minutes  17    METs  2      Rower   Level  3    Minutes  17       Social History   Tobacco Use  Smoking Status Never Smoker  Smokeless Tobacco Never Used    Goals Met:  Exercise tolerated well No report of cardiac concerns or symptoms Strength training completed today  Goals Unmet:  Not Applicable  Comments: Service time is from 1030 to 1210    Dr. Wesam G. Yacoub is Medical Director for Pulmonary Rehab at [facility]. 

## 2017-04-12 DIAGNOSIS — H16223 Keratoconjunctivitis sicca, not specified as Sjogren's, bilateral: Secondary | ICD-10-CM | POA: Diagnosis not present

## 2017-04-13 ENCOUNTER — Encounter (HOSPITAL_COMMUNITY): Payer: Medicare HMO

## 2017-04-17 NOTE — Progress Notes (Signed)
Pulmonary Individual Treatment Plan  Patient Details  Name: Hannah Barron MRN: [id number] Date of Birth: 1970-07-18 Referring Provider:     Pulmonary Rehab Walk Test from 02/21/2017 in [facility]  Referring Provider  Dr. Vaughan Browner      Initial Encounter Date:    Pulmonary Rehab Walk Test from 02/21/2017 in [facility]  Date  02/21/17  Referring Provider  Dr. Vaughan Browner      Visit Diagnosis: Bronchiectasis without complication ([place])  Patient's Home Medications on Admission:   Current Outpatient Medications:  .  albuterol (VENTOLIN HFA) 108 (90 Base) MCG/ACT inhaler, Inhale 2 puffs into the lungs every 6 (six) hours as needed for wheezing or shortness of breath., Disp: 3 Inhaler, Rfl: 3 .  busPIRone (BUSPAR) 15 MG tablet, Take 1 tablet (15 mg total) by mouth at bedtime. One half tablet at bedtime for first week, then increase to one whole tablet at bedtime, Disp: 30 tablet, Rfl: 0 .  dicyclomine (BENTYL) 20 MG tablet, TAKE 1 TABLET (20 MG TOTAL) BY MOUTH DAILY AS NEEDED FOR SPASMS., Disp: 30 tablet, Rfl: 0 .  esomeprazole (NEXIUM) 20 MG capsule, Take 1 capsule (20 mg total) by mouth 2 (two) times daily. 30 minutes before meals, Disp: 60 capsule, Rfl: 2 .  esomeprazole (NEXIUM) 20 MG capsule, TAKE 1 CAPSULE EVERY DAY AT 12PM, Disp: 90 capsule, Rfl: 0 .  estradiol (ESTRACE) 0.5 MG tablet, Take 1 tablet (0.5 mg total) by mouth daily., Disp: 30 tablet, Rfl: 6 .  ibuprofen (ADVIL,MOTRIN) 400 MG tablet, Take 1 tablet (400 mg total) by mouth every 6 (six) hours as needed., Disp: 90 tablet, Rfl: 0 .  metoprolol succinate (TOPROL-XL) 50 MG 24 hr tablet, Take 1 tablet (50m) in morning and 1/2 tablet (227m in evening with or immediately following a meal. (Patient taking differently: Take 25-50 mg by mouth See admin instructions. 50 mg in the morning and 25 mg in the evening), Disp: 135 tablet, Rfl: 1 .  metoprolol tartrate (LOPRESSOR) 25 MG  tablet, TAKE 1/2 TABLET BY MOUTH TWICE A DAY, Disp: 90 tablet, Rfl: 3 .  Respiratory Therapy Supplies (FLUTTER) DEVI, Use as directed., Disp: 1 each, Rfl: 0 .  RESTASIS 0.05 % ophthalmic emulsion, Instill 1 drop into both eyes two times a day, Disp: , Rfl: 2 .  SYMBICORT 160-4.5 MCG/ACT inhaler, TAKE 2 PUFFS BY MOUTH TWICE A DAY, Disp: 10.2 Inhaler, Rfl: 3 .  trimethoprim-polymyxin b (POLYTRIM) ophthalmic solution, Place 1 drop into the left eye every 4 (four) hours., Disp: 10 mL, Rfl: 0  Current Facility-Administered Medications:  .  0.9 %  sodium chloride infusion, 500 mL, Intravenous, Once, Danis, HeKirke CorinMD  Past Medical History: Past Medical History:  Diagnosis Date  . Anemia   . [place]chiectasis ([facility]  . COPD (chronic obstructive pulmonary disease) ([facility] [place]  . GERD (gastroesophageal reflux disease)   . Migraines   . Paroxysmal SVT (supraventricular tachycardia) ([facility]   a. diagnosed in 11/2015.  . Right leg numbness     Tobacco Use: Social History   Tobacco Use  Smoking Status Never Smoker  Smokeless Tobacco Never Used    Labs: Recent Review Flowsheet Data    Labs for ITP Cardiac and Pulmonary Rehab Latest Ref Rng & Units 06/04/2015 04/28/2016   Cholestrol 0 - 200 mg/dL - 194   LDLCALC 0 - 99 mg/dL - 111(H)   HDL >39.00 mg/dL - 62.00   Trlycerides 0.0 -  149.0 mg/dL - 108.0   Hemoglobin A1c 4.6 - 6.5 % 5.9 -      Capillary Blood Glucose: No results found for: GLUCAP   Pulmonary Assessment Scores: Pulmonary Assessment Scores    Row Name 02/22/17 1458 02/23/17 0710       ADL UCSD   ADL Phase  Entry  Entry    SOB Score total  55  -      CAT Score   CAT Score  40 Entry  -      mMRC Score   mMRC Score  -  4       Pulmonary Function Assessment: Pulmonary Function Assessment - 02/17/17 1102      Breath   Bilateral Breath Sounds  Clear;Decreased    Shortness of Breath  Limiting activity;Fear of Shortness of Breath;Yes       Exercise Target Goals:     Exercise Program Goal: Individual exercise prescription set using results from initial 6 min walk test and THRR while considering  patient's activity barriers and safety.    Exercise Prescription Goal: Initial exercise prescription builds to 30-45 minutes a day of aerobic activity, 2-3 days per week.  Home exercise guidelines will be given to patient during program as part of exercise prescription that the participant will acknowledge.  Activity Barriers & Risk Stratification: Activity Barriers & Cardiac Risk Stratification - 02/17/17 1041      Activity Barriers & Cardiac Risk Stratification   Activity Barriers  Deconditioning;Shortness of Breath       6 Minute Walk: 6 Minute Walk    Row Name 02/23/17 0710         6 Minute Walk   Phase  Initial     Distance  1332 feet     Walk Time  6 minutes     # of Rest Breaks  0     MPH  2.52     METS  2.91     RPE  12     Perceived Dyspnea   3     Symptoms  No     Resting HR  71 bpm     Resting BP  138/80     Resting Oxygen Saturation   99 %     Exercise Oxygen Saturation  during 6 min walk  98 %     Max Ex. HR  101 bpm     Max Ex. BP  114/64       Interval HR   1 Minute HR  90     2 Minute HR  91     3 Minute HR  101     4 Minute HR  94     5 Minute HR  101     6 Minute HR  90     2 Minute Post HR  91     Interval Heart Rate?  Yes       Interval Oxygen   Interval Oxygen?  Yes     Baseline Oxygen Saturation %  99 %     1 Minute Oxygen Saturation %  99 %     1 Minute Liters of Oxygen  0 L     2 Minute Oxygen Saturation %  100 %     2 Minute Liters of Oxygen  0 L     3 Minute Oxygen Saturation %  99 %     3 Minute Liters of Oxygen  0 L     4 Minute  Oxygen Saturation %  99 %     4 Minute Liters of Oxygen  0 L     5 Minute Oxygen Saturation %  99 %     5 Minute Liters of Oxygen  0 L     6 Minute Oxygen Saturation %  98 %     6 Minute Liters of Oxygen  0 L     2 Minute Post Oxygen Saturation %  99 %     2 Minute  Post Liters of Oxygen  0 L        Oxygen Initial Assessment: Oxygen Initial Assessment - 02/23/17 0710      Initial 6 min Walk   Oxygen Used  None      Program Oxygen Prescription   Program Oxygen Prescription  None       Oxygen Re-Evaluation: Oxygen Re-Evaluation    Row Name 03/20/17 1343 04/14/17 1116           Program Oxygen Prescription   Program Oxygen Prescription  None  None        Home Oxygen   Home Oxygen Device  None  None      Sleep Oxygen Prescription  None  None      Home Exercise Oxygen Prescription  None  None      Home at Rest Exercise Oxygen Prescription  None  None         Oxygen Discharge (Final Oxygen Re-Evaluation): Oxygen Re-Evaluation - 04/14/17 1116      Program Oxygen Prescription   Program Oxygen Prescription  None      Home Oxygen   Home Oxygen Device  None    Sleep Oxygen Prescription  None    Home Exercise Oxygen Prescription  None    Home at Rest Exercise Oxygen Prescription  None       Initial Exercise Prescription: Initial Exercise Prescription - 02/23/17 0700      Date of Initial Exercise RX and Referring Provider   Date  02/21/17    Referring Provider  Dr. Vaughan Browner      Treadmill   MPH  1.7    Grade  0    Minutes  17      NuStep   Level  2    SPM  80    Minutes  17    METs  1.5      Rower   Level  2    Watts  10    Minutes  17      Prescription Details   Frequency (times per week)  2    Duration  Progress to 45 minutes of aerobic exercise without signs/symptoms of physical distress      Intensity   THRR 40-80% of Max Heartrate  70-139    Ratings of Perceived Exertion  11-13    Perceived Dyspnea  0-4      Progression   Progression  Continue to progress workloads to maintain intensity without signs/symptoms of physical distress.      Resistance Training   Training Prescription  Yes    Weight  orange bands    Reps  10-15       Perform Capillary Blood Glucose checks as needed.  Exercise Prescription  Changes: Exercise Prescription Changes    Row Name 02/28/17 1200 03/09/17 1030 03/09/17 1200 03/30/17 1400 04/11/17 1200     Response to Exercise   Blood Pressure (Admit)  104/60  98/58  -  94/64  108/68  Blood Pressure (Exercise)  138/64  118/70  -  120/70  120/70   Blood Pressure (Exit)  104/70  110/70  -  100/62  100/60   Heart Rate (Admit)  73 bpm  72 bpm  -  79 bpm  90 bpm   Heart Rate (Exercise)  98 bpm  100 bpm  -  118 bpm  114 bpm   Heart Rate (Exit)  81 bpm  71 bpm  -  92 bpm  84 bpm   Oxygen Saturation (Admit)  98 %  99 %  -  98 %  99 %   Oxygen Saturation (Exercise)  98 %  97 %  -  99 %  99 %   Oxygen Saturation (Exit)  100 %  100 %  -  100 %  99 %   Rating of Perceived Exertion (Exercise)  15  15  -  13  15   Perceived Dyspnea (Exercise)  2  1  -  1  2   Duration  Progress to 45 minutes of aerobic exercise without signs/symptoms of physical distress  Progress to 45 minutes of aerobic exercise without signs/symptoms of physical distress  -  Progress to 45 minutes of aerobic exercise without signs/symptoms of physical distress  Progress to 45 minutes of aerobic exercise without signs/symptoms of physical distress   Intensity  Other (comment) 40-80% of HRR  THRR unchanged  -  THRR unchanged  THRR unchanged     Progression   Progression  Continue to progress workloads to maintain intensity without signs/symptoms of physical distress.  Continue to progress workloads to maintain intensity without signs/symptoms of physical distress.  -  Continue to progress workloads to maintain intensity without signs/symptoms of physical distress.  Continue to progress workloads to maintain intensity without signs/symptoms of physical distress.     Resistance Training   Training Prescription  Yes  Yes  -  Yes  Yes   Weight  orange bands  orange bands  -  orange bands  orange bands   Reps  10-15  10-15  -  10-15  10-15   Time  10 Minutes  10 Minutes  -  10 Minutes  10 Minutes     Treadmill   MPH   2.3  2.5  -  3  3.2   Grade  2  3  -  3  3   Minutes  17  17  -  17  17     NuStep   Level  2  -  -  4  5   SPM  -  -  -  80  -   Minutes  17  -  -  17  17   METs  2.2  -  -  2.1  2     Rower   Level  2  2  -  -  3   Minutes  17  17  -  -  17     Home Exercise Plan   Plans to continue exercise at  -  -  [employer] (comment)  -  -   Frequency  -  -  Add 2 additional days to program exercise sessions.  -  -      Exercise Comments: Exercise Comments    Row Name 03/09/17 1233           Exercise Comments  Home exercise completed  Exercise Goals and Review: Exercise Goals    Row Name 02/17/17 1042             Exercise Goals   Increase Physical Activity  Yes       Intervention  Provide advice, education, support and counseling about physical activity/exercise needs.;Develop an individualized exercise prescription for aerobic and resistive training based on initial evaluation findings, risk stratification, comorbidities and participant's personal goals.       Expected Outcomes  Short Term: Attend rehab on a regular basis to increase amount of physical activity.       Increase Strength and Stamina  Yes       Intervention  Provide advice, education, support and counseling about physical activity/exercise needs.;Develop an individualized exercise prescription for aerobic and resistive training based on initial evaluation findings, risk stratification, comorbidities and participant's personal goals.       Expected Outcomes  Short Term: Increase workloads from initial exercise prescription for resistance, speed, and METs.       Able to understand and use rate of perceived exertion (RPE) scale  Yes       Intervention  Provide education and explanation on how to use RPE scale       Expected Outcomes  Short Term: Able to use RPE daily in rehab to express subjective intensity level;Long Term:  Able to use RPE to guide intensity level when exercising independently        Able to understand and use Dyspnea scale  Yes       Intervention  Provide education and explanation on how to use Dyspnea scale       Expected Outcomes  Short Term: Able to use Dyspnea scale daily in rehab to express subjective sense of shortness of breath during exertion;Long Term: Able to use Dyspnea scale to guide intensity level when exercising independently       Knowledge and understanding of Target Heart Rate Range (THRR)  Yes       Intervention  Provide education and explanation of THRR including how the numbers were predicted and where they are located for reference       Expected Outcomes  Short Term: Able to state/look up THRR       Understanding of Exercise Prescription  Yes       Intervention  Provide education, explanation, and written materials on patient's individual exercise prescription       Expected Outcomes  Short Term: Able to explain program exercise prescription;Long Term: Able to explain home exercise prescription to exercise independently          Exercise Goals Re-Evaluation : Exercise Goals Re-Evaluation    Row Name 03/20/17 1343 04/14/17 1124           Exercise Goal Re-Evaluation   Exercise Goals Review  Increase Strength and Stamina;Able to understand and use Dyspnea scale;Increase Physical Activity;Able to understand and use rate of perceived exertion (RPE) scale;Knowledge and understanding of Target Heart Rate Range (THRR);Understanding of Exercise Prescription  Increase Strength and Stamina;Able to understand and use Dyspnea scale;Increase Physical Activity;Able to understand and use rate of perceived exertion (RPE) scale;Knowledge and understanding of Target Heart Rate Range (THRR);Understanding of Exercise Prescription      Comments  Patient is progressing well in rehab. She states that she is already feeling better. Patient's highest avg MET level is 2.6. Is open to workload changes. Exercising at her local [employer] with her husband. Will cont. to monitor and  progress as able.   Patient  is progressing well in rehab. She states that she is already feeling better. Patient's highest avg MET level is 2.6. Is open to workload changes. Exercising at her local [employer] with her husband. Will cont. to monitor and progress as able.       Expected Outcomes  Through exercise at rehab and at home, patient will increase endurance and strength. Patient will also be able to perform ADL's with less shortness of breath and fatigue.  Through exercise at rehab and at home, patient will increase endurance and strength. Patient will also be able to perform ADL's with less shortness of breath and fatigue.         Discharge Exercise Prescription (Final Exercise Prescription Changes): Exercise Prescription Changes - 04/11/17 1200      Response to Exercise   Blood Pressure (Admit)  108/68    Blood Pressure (Exercise)  120/70    Blood Pressure (Exit)  100/60    Heart Rate (Admit)  90 bpm    Heart Rate (Exercise)  114 bpm    Heart Rate (Exit)  84 bpm    Oxygen Saturation (Admit)  99 %    Oxygen Saturation (Exercise)  99 %    Oxygen Saturation (Exit)  99 %    Rating of Perceived Exertion (Exercise)  15    Perceived Dyspnea (Exercise)  2    Duration  Progress to 45 minutes of aerobic exercise without signs/symptoms of physical distress    Intensity  THRR unchanged      Progression   Progression  Continue to progress workloads to maintain intensity without signs/symptoms of physical distress.      Resistance Training   Training Prescription  Yes    Weight  orange bands    Reps  10-15    Time  10 Minutes      Treadmill   MPH  3.2    Grade  3    Minutes  17      NuStep   Level  5    Minutes  17    METs  2      Rower   Level  3    Minutes  17       Nutrition:  Target Goals: Understanding of nutrition guidelines, daily intake of sodium <1574m, cholesterol <2059m calories 30% from fat and 7% or less from saturated fats, daily to have 5 or more servings of  fruits and vegetables.  Biometrics: Pre Biometrics - 02/17/17 1047      Pre Biometrics   Grip Strength  25 kg        Nutrition Therapy Plan and Nutrition Goals: Nutrition Therapy & Goals - 03/03/17 1357      Nutrition Therapy   Diet  General, healthful      Personal Nutrition Goals   Nutrition Goal  Identify food quantities necessary to achieve wt loss of  -2# per week to a goal wt loss of 2.7-10.9 kg (6-24 lb) at graduation from pulmonary rehab.      Intervention Plan   Intervention  Prescribe, educate and counsel regarding individualized specific dietary modifications aiming towards targeted core components such as weight, hypertension, lipid management, diabetes, heart failure and other comorbidities.    Expected Outcomes  Short Term Goal: Understand basic principles of dietary content, such as calories, fat, sodium, cholesterol and nutrients.;Long Term Goal: Adherence to prescribed nutrition plan.       Nutrition Assessments: Nutrition Assessments - 03/03/17 1356      Rate Your Plate  Scores   Pre Score  -- survey returned incomplete       Nutrition Goals Re-Evaluation:   Nutrition Goals Discharge (Final Nutrition Goals Re-Evaluation):   Psychosocial: Target Goals: Acknowledge presence or absence of significant depression and/or stress, maximize coping skills, provide positive support system. Participant is able to verbalize types and ability to use techniques and skills needed for reducing stress and depression.  Initial Review & Psychosocial Screening: Initial Psych Review & Screening - 02/17/17 1102      Initial Review   Current issues with  Current Stress Concerns;Current Sleep Concerns    Source of Stress Concerns  Chronic Illness;Unable to participate in former interests or hobbies;Unable to perform yard/household activities    Comments  patient states sleep problems my be a result of [place]?  Yes    Comments   no friends in area. All family back in [place]. Ex husband still friend. New husband of 3 years supportive and daughter at college      Barriers   Psychosocial barriers to participate in program  There are no identifiable barriers or psychosocial needs.      Screening Interventions   Interventions  Encouraged to exercise       Quality of Life Scores:  Scores of 19 and below usually indicate a poorer quality of life in these areas.  A difference of  2-3 points is a clinically meaningful difference.  A difference of 2-3 points in the total score of the Quality of Life Index has been associated with significant improvement in overall quality of life, self-image, physical symptoms, and general health in studies assessing change in quality of life.   PHQ-9: Recent Review Flowsheet Data    Depression screen [facility] 2/9 02/17/2017 12/29/2016 08/23/2016 04/21/2016 04/01/2016   Decreased Interest 0 1 - 3 3   Down, Depressed, Hopeless 0 _0 0   PHQ - 2 Score 0 _1 Altered sleeping - 0 0 0 3   Tired, decreased energy - _2 Change in appetite - 3 0 3 3   Feeling bad or failure about yourself  - 0 0 0 0   Trouble concentrating - 2 0 0 2   Moving slowly or fidgety/restless - - 0 0 0   Suicidal thoughts - 0 0 0 0   PHQ-9 Score - _3 Interpretation of Total Score  Total Score Depression Severity:  1-4 = Minimal depression, 5-9 = Mild depression, 10-14 = Moderate depression, 15-19 = Moderately severe depression, 20-27 = Severe depression   Psychosocial Evaluation and Intervention: Psychosocial Evaluation - 02/17/17 1104      Psychosocial Evaluation & Interventions   Interventions  Encouraged to exercise with the program and follow exercise prescription    Expected Outcomes  patient will remain free from psychosocial barriers to participation       Psychosocial Re-Evaluation: Psychosocial Re-Evaluation    Row Name 03/20/17 0857 04/17/17 1211           Psychosocial  Re-Evaluation   Current issues with  None Identified  Current Stress Concerns      Comments  -  current stess is related to her fathers illness and her lack of information from the family about his condition       Expected Outcomes  patient will remain free from psychosocial barriers to participation in  pulmonary rehab  patient will remain free from psychosocial barriers to participation in pulmonary rehab      Interventions  Encouraged to attend Pulmonary Rehabilitation for the exercise  Encouraged to attend Pulmonary Rehabilitation for the exercise emotional support and will refer to conseling at patients request      Continue Psychosocial Services   No Follow up required  -         Psychosocial Discharge (Final Psychosocial Re-Evaluation): Psychosocial Re-Evaluation - 04/17/17 1211      Psychosocial Re-Evaluation   Current issues with  Current Stress Concerns    Comments  current stess is related to her fathers illness and her lack of information from the family about his condition     Expected Outcomes  patient will remain free from psychosocial barriers to participation in pulmonary rehab    Interventions  Encouraged to attend Pulmonary Rehabilitation for the exercise emotional support and will refer to [place]eling at patients request       Education: Education Goals: Education classes will be provided on a weekly basis, covering required topics. Participant will state understanding/return demonstration of topics presented.  Learning Barriers/Preferences: Learning Barriers/Preferences - 02/17/17 1101      Learning Barriers/Preferences   Learning Barriers  Language    Learning Preferences  Individual Instruction;Verbal Instruction       Education Topics: Risk Factor Reduction:  -Group instruction that is supported by a PowerPoint presentation. Instructor discusses the definition of a risk factor, different risk factors for pulmonary disease, and how the heart and lungs work  together.     Nutrition for Pulmonary Patient:  -Group instruction provided by PowerPoint slides, verbal discussion, and written materials to support subject matter. The instructor gives an explanation and review of healthy diet recommendations, which includes a discussion on weight management, recommendations for fruit and vegetable consumption, as well as protein, fluid, caffeine, fiber, sodium, sugar, and alcohol. Tips for eating when patients are short of breath are discussed.   PULMONARY REHAB OTHER RESPIRATORY from 03/30/2017 in [facility]  Date  03/30/17  Educator  Parke Simmers  Instruction Review Code  2- Demonstrated Understanding      Pursed Lip Breathing:  -Group instruction that is supported by demonstration and informational handouts. Instructor discusses the benefits of pursed lip and diaphragmatic breathing and detailed demonstration on how to preform both.     Oxygen Safety:  -Group instruction provided by PowerPoint, verbal discussion, and written material to support subject matter. There is an overview of "What is Oxygen" and "Why do we need it".  Instructor also reviews how to create a safe environment for oxygen use, the importance of using oxygen as prescribed, and the risks of noncompliance. There is a brief discussion on traveling with oxygen and resources the patient may utilize.   Oxygen Equipment:  -Group instruction provided by [facility] Staff utilizing handouts, written materials, and equipment demonstrations.   Signs and Symptoms:  -Group instruction provided by written material and verbal discussion to support subject matter. Warning signs and symptoms of infection, stroke, and heart attack are reviewed and when to call the physician/911 reinforced. Tips for preventing the spread of infection discussed.   PULMONARY REHAB OTHER RESPIRATORY from 03/30/2017 in [place]  Date  03/09/17  Educator  RN   Instruction Review Code  1- Verbalizes Understanding      Advanced Directives:  -Group instruction provided by verbal instruction and written material to support subject matter. Instructor  reviews Advanced Directive laws and proper instruction for filling out document.   Pulmonary Video:  -Group video education that reviews the importance of medication and oxygen compliance, exercise, good nutrition, pulmonary hygiene, and pursed lip and diaphragmatic breathing for the pulmonary patient.   Exercise for the Pulmonary Patient:  -Group instruction that is supported by a PowerPoint presentation. Instructor discusses benefits of exercise, core components of exercise, frequency, duration, and intensity of an exercise routine, importance of utilizing pulse oximetry during exercise, safety while exercising, and options of places to exercise outside of rehab.     PULMONARY REHAB OTHER RESPIRATORY from 03/30/2017 in [place] [facility]  Date  03/02/17  Educator  Cloyde Reams  Instruction Review Code  2- Demonstrated Understanding      Pulmonary Medications:  -Verbally interactive group education provided by instructor with focus on inhaled medications and proper administration.   Anatomy and Physiology of the Respiratory System and Intimacy:  -Group instruction provided by PowerPoint, verbal discussion, and written material to support subject matter. Instructor reviews respiratory cycle and anatomical components of the respiratory system and their functions. Instructor also reviews differences in obstructive and restrictive respiratory diseases with examples of each. Intimacy, Sex, and Sexuality differences are reviewed with a discussion on how relationships can change when diagnosed with pulmonary disease. Common sexual concerns are reviewed.   PULMONARY REHAB OTHER RESPIRATORY from 03/30/2017 in [place]  Date  03/23/17  Educator  rn   Instruction Review Code  2- Demonstrated Understanding      MD DAY -A group question and answer session with a medical doctor that allows participants to ask questions that relate to their pulmonary disease state.   OTHER EDUCATION -Group or individual verbal, written, or video instructions that support the educational goals of the pulmonary rehab program.   Holiday Eating Survival Tips:  -Group instruction provided by PowerPoint slides, verbal discussion, and written materials to support subject matter. The instructor gives patients tips, tricks, and techniques to help them not only survive but enjoy the holidays despite the onslaught of food that accompanies the holidays.   Knowledge Questionnaire Score: Knowledge Questionnaire Score - 02/22/17 1457      Knowledge Questionnaire Score   Pre Score  12/13 Staff assisted pt with test       Core Components/Risk Factors/Patient Goals at Admission: Personal Goals and Risk Factors at Admission - 02/17/17 1102      Core Components/Risk Factors/Patient Goals on Admission   Improve shortness of breath with ADL's  Yes    Intervention  Provide education, individualized exercise plan and daily activity instruction to help decrease symptoms of SOB with activities of daily living.    Expected Outcomes  Short Term: Improve cardiorespiratory fitness to achieve a reduction of symptoms when performing ADLs;Long Term: Be able to perform more ADLs without symptoms or delay the onset of symptoms       Core Components/Risk Factors/Patient Goals Review:  Goals and Risk Factor Review    Row Name 03/17/17 1551 04/17/17 1206           Core Components/Risk Factors/Patient Goals Review   Personal Goals Review  Improve shortness of breath with ADL's;Develop more efficient breathing techniques such as purse lipped breathing and diaphragmatic breathing and practicing self-pacing with activity.  Improve shortness of breath with ADL's;Develop more  efficient breathing techniques such as purse lipped breathing and diaphragmatic breathing and practicing self-pacing with activity.      Review  patient  is doing well in pulmonary rehab. she has attended 5 sessions since her admission. she states she is beginning to see an improvement in her shortness of breath during her exercise. she continues to be encouraged  to use plb during exertions.  patient continues to do very well in pulmonary rehab. she has made friends and has surpassed any language or social barriers. she states she has not felt well over the last several sessions. she is not exercising at home as instructed. she tolerates workload increases when exercising. she continues to need prompting to PLB although once prompted she is able to continue without re-reminding. she did not attend her last exercise session related to personal stress. she states her father in [place] is very sick and the family is not giving her a lot of information about his health. the only thing she knows is that he is in the hospital "not able to talk and everyone is crying on the phone when I speak to them but they wont tell me what is wrong with dad". she was very tearful. she was given emotional support and reassurance. will continue to support patients physical and emotional needs. expect to see greater progression towards goals over the next 30 days.      Expected Outcomes  see admission expected outcomes  see admission expected outcomes         Core Components/Risk Factors/Patient Goals at Discharge (Final Review):  Goals and Risk Factor Review - 04/17/17 1206      Core Components/Risk Factors/Patient Goals Review   Personal Goals Review  Improve shortness of breath with ADL's;Develop more efficient breathing techniques such as purse lipped breathing and diaphragmatic breathing and practicing self-pacing with activity.    Review  patient continues to do very well in pulmonary rehab. she has made friends and has  surpassed any language or social barriers. she states she has not felt well over the last several sessions. she is not exercising at home as instructed. she tolerates workload increases when exercising. she continues to need prompting to PLB although once prompted she is able to continue without re-reminding. she did not attend her last exercise session related to personal stress. she states her father in [place] is very sick and the family is not giving her a lot of information about his health. the only thing she knows is that he is in the hospital "not able to talk and everyone is crying on the phone when I speak to them but they wont tell me what is wrong with dad". she was very tearful. she was given emotional support and reassurance. will continue to support patients physical and emotional needs. expect to see greater progression towards goals over the next 30 days.    Expected Outcomes  see admission expected outcomes       ITP Comments:   Comments: patient has attended 12 pulmonary rehab sessions since admission.

## 2017-04-18 ENCOUNTER — Encounter (HOSPITAL_COMMUNITY)
Admission: RE | Admit: 2017-04-18 | Discharge: 2017-04-18 | Disposition: A | Discharge status: Home / Self Care | Payer: Medicare HMO | Source: Ambulatory Visit | Attending: Pulmonary Disease | Admitting: Pulmonary Disease

## 2017-04-18 DIAGNOSIS — J479 Bronchiectasis, uncomplicated: Secondary | ICD-10-CM

## 2017-04-18 DIAGNOSIS — G43909 Migraine, unspecified, not intractable, without status migrainosus: Secondary | ICD-10-CM | POA: Diagnosis not present

## 2017-04-18 DIAGNOSIS — J449 Chronic obstructive pulmonary disease, unspecified: Secondary | ICD-10-CM | POA: Diagnosis not present

## 2017-04-18 DIAGNOSIS — K219 Gastro-esophageal reflux disease without esophagitis: Secondary | ICD-10-CM | POA: Diagnosis not present

## 2017-04-18 NOTE — Progress Notes (Signed)
Daily Session Note  Patient Details  Name: Hannah Barron MRN: [id number] Date of Birth: 1970/06/24 Referring Provider:     Pulmonary Rehab Walk Test from 02/21/2017 in [place]  Referring Provider  Dr. Vaughan Browner      Encounter Date: 04/18/2017  Check In: Session Check In - 04/18/17 1224      Check-In   Staff Present  Su Hilt, MS, ACSM RCEP, Exercise Physiologist;Evaleen Sant Ysidro Evert, RN;Portia Rollene Rotunda, RN, BSN    Supervising physician immediately available to respond to emergencies  [facility] immediately available    Physician(s)  Dr. Lonny Prude    Medication changes reported      No    Fall or balance concerns reported     No    Tobacco Cessation  No Change    Warm-up and Cool-down  Performed as group-led instruction    Resistance Training Performed  Yes    VAD Patient?  No      Pain Assessment   Currently in Pain?  No/denies    Multiple Pain Sites  No       Capillary Blood Glucose: No results found for this or any previous visit (from the past 24 hour(s)).    Social History   Tobacco Use  Smoking Status Never Smoker  Smokeless Tobacco Never Used    Goals Met:  Exercise tolerated well No report of cardiac concerns or symptoms Strength training completed today  Goals Unmet:  Not Applicable  Comments: Service time is from 1030 to 1210    Dr. Rush Farmer is Medical Director for Pulmonary Rehab at [facility].

## 2017-04-20 ENCOUNTER — Encounter (HOSPITAL_COMMUNITY)
Admission: RE | Admit: 2017-04-20 | Discharge: 2017-04-20 | Disposition: A | Discharge status: Home / Self Care | Payer: Medicare HMO | Source: Ambulatory Visit | Attending: Pulmonary Disease | Admitting: Pulmonary Disease

## 2017-04-20 DIAGNOSIS — J479 Bronchiectasis, uncomplicated: Secondary | ICD-10-CM | POA: Diagnosis not present

## 2017-04-20 DIAGNOSIS — J449 Chronic obstructive pulmonary disease, unspecified: Secondary | ICD-10-CM | POA: Diagnosis not present

## 2017-04-20 DIAGNOSIS — G43909 Migraine, unspecified, not intractable, without status migrainosus: Secondary | ICD-10-CM | POA: Diagnosis not present

## 2017-04-20 DIAGNOSIS — K219 Gastro-esophageal reflux disease without esophagitis: Secondary | ICD-10-CM | POA: Diagnosis not present

## 2017-04-20 NOTE — Progress Notes (Signed)
Daily Session Note  Patient Details  Name: Hannah Barron MRN: [id number] Date of Birth: May 18, 1970 Referring Provider:     Pulmonary Rehab Walk Test from 02/21/2017 in [place]  Referring Provider  Dr. Vaughan Browner      Encounter Date: 04/20/2017  Check In: Session Check In - 04/20/17 1030      Check-In   Location  [facility]    Staff Present  Su Hilt, MS, ACSM RCEP, Exercise Physiologist;Portia Rollene Rotunda, RN, BSN;Carlette Carlton, RN, BSN    Supervising physician immediately available to respond to emergencies  [facility] immediately available    Physician(s)  Dr. Alfredia Ferguson    Medication changes reported      No    Fall or balance concerns reported     No    Tobacco Cessation  No Change    Warm-up and Cool-down  Performed as group-led instruction    Resistance Training Performed  Yes    VAD Patient?  No      Pain Assessment   Currently in Pain?  No/denies    Multiple Pain Sites  No       Capillary Blood Glucose: No results found for this or any previous visit (from the past 24 hour(s)).    Social History   Tobacco Use  Smoking Status Never Smoker  Smokeless Tobacco Never Used    Goals Met:  Exercise tolerated well No report of cardiac concerns or symptoms Strength training completed today  Goals Unmet:  Not Applicable  Comments: Service time is from 10:30a to 12:30p    Dr. Rush Farmer is Medical Director for Pulmonary Rehab at [facility].

## 2017-04-25 ENCOUNTER — Encounter (HOSPITAL_COMMUNITY)
Admission: RE | Admit: 2017-04-25 | Discharge: 2017-04-25 | Disposition: A | Discharge status: Home / Self Care | Payer: Medicare HMO | Source: Ambulatory Visit | Attending: Pulmonary Disease | Admitting: Pulmonary Disease

## 2017-04-25 VITALS — Wt 176.1 lb

## 2017-04-25 DIAGNOSIS — J479 Bronchiectasis, uncomplicated: Secondary | ICD-10-CM

## 2017-04-25 DIAGNOSIS — K219 Gastro-esophageal reflux disease without esophagitis: Secondary | ICD-10-CM | POA: Diagnosis not present

## 2017-04-25 DIAGNOSIS — J449 Chronic obstructive pulmonary disease, unspecified: Secondary | ICD-10-CM | POA: Diagnosis not present

## 2017-04-25 DIAGNOSIS — G43909 Migraine, unspecified, not intractable, without status migrainosus: Secondary | ICD-10-CM | POA: Diagnosis not present

## 2017-04-25 NOTE — Progress Notes (Signed)
Daily Session Note  Patient Details  Name: Hannah Barron MRN: [id number] Date of Birth: 1971-01-03 Referring Provider:     Pulmonary Rehab Walk Test from 02/21/2017 in [place]  Referring Provider  Dr. Vaughan Browner      Encounter Date: 04/25/2017  Check In: Session Check In - 04/25/17 1228      Check-In   Location  [facility]    Staff Present  Cloyde Reams Sherolyn Trettin, MS, ACSM RCEP, Exercise Physiologist;Lisa Ysidro Evert, RN;Portia Throckmorton, RN, BSN;Carlette Carlton, RN, BSN    Supervising physician immediately available to respond to emergencies  [facility] immediately available    Physician(s)  Dr. Alfredia Ferguson    Medication changes reported      No    Fall or balance concerns reported     No    Tobacco Cessation  No Change    Warm-up and Cool-down  Not performed (comment) arrived late    Resistance Training Performed  No arrived late    VAD Patient?  No      Pain Assessment   Currently in Pain?  No/denies    Multiple Pain Sites  No       Capillary Blood Glucose: No results found for this or any previous visit (from the past 24 hour(s)).  Exercise Prescription Changes - 04/25/17 1200      Response to Exercise   Blood Pressure (Admit)  120/50    Blood Pressure (Exercise)  118/70    Blood Pressure (Exit)  100/56    Heart Rate (Admit)  113 bpm    Heart Rate (Exercise)  139 bpm    Heart Rate (Exit)  100 bpm    Oxygen Saturation (Admit)  98 %    Oxygen Saturation (Exercise)  98 %    Oxygen Saturation (Exit)  98 %    Rating of Perceived Exertion (Exercise)  15    Perceived Dyspnea (Exercise)  2    Duration  Progress to 45 minutes of aerobic exercise without signs/symptoms of physical distress    Intensity  THRR unchanged      Progression   Progression  Continue to progress workloads to maintain intensity without signs/symptoms of physical distress.      Resistance Training   Training Prescription  Yes    Weight  orange bands    Reps   10-15    Time  10 Minutes      Treadmill   MPH  3.2    Grade  3    Minutes  17      Bike   Level  0.6    Minutes  17      NuStep   Level  5    Minutes  17    METs  2.1       Social History   Tobacco Use  Smoking Status Never Smoker  Smokeless Tobacco Never Used    Goals Met:  Exercise tolerated well No report of cardiac concerns or symptoms Strength training completed today  Goals Unmet:  Not Applicable  Comments: Service time is from 10:30a to 12:10p    Dr. Rush Farmer is Medical Director for Pulmonary Rehab at [facility].

## 2017-04-26 ENCOUNTER — Telehealth: Payer: Self-pay

## 2017-04-26 MED ORDER — BUSPIRONE HCL 15 MG PO TABS: 15.0000 mg | ORAL_TABLET | Freq: Every day | ORAL | 1 refills | Status: DC

## 2017-04-26 NOTE — Telephone Encounter (Signed)
Incoming fax request for buspirone 15 mg QHS. Pt states that she is using the medication as directed. She feels like its helping a lot and wants to continue current dose. Please advise on additional refills.

## 2017-04-26 NOTE — Telephone Encounter (Signed)
Done. I gave a month supply with one refill. I need to see her in 6-8 weeks to monitor symptoms.  Please arrange

## 2017-04-27 ENCOUNTER — Telehealth (HOSPITAL_COMMUNITY): Payer: Self-pay | Admitting: Nurse Practitioner

## 2017-04-27 ENCOUNTER — Encounter (HOSPITAL_COMMUNITY): Payer: Medicare HMO

## 2017-04-27 NOTE — Telephone Encounter (Signed)
Follow up made 06-2011.

## 2017-05-01 ENCOUNTER — Encounter: Payer: Self-pay | Admitting: Nurse Practitioner

## 2017-05-02 ENCOUNTER — Encounter (HOSPITAL_COMMUNITY)
Admission: RE | Admit: 2017-05-02 | Discharge: 2017-05-02 | Disposition: A | Discharge status: Home / Self Care | Payer: Medicare HMO | Source: Ambulatory Visit | Attending: Pulmonary Disease | Admitting: Pulmonary Disease

## 2017-05-02 VITALS — BP 92/60 | HR 81 | Wt 175.9 lb

## 2017-05-02 DIAGNOSIS — G43909 Migraine, unspecified, not intractable, without status migrainosus: Secondary | ICD-10-CM | POA: Diagnosis not present

## 2017-05-02 DIAGNOSIS — J479 Bronchiectasis, uncomplicated: Secondary | ICD-10-CM

## 2017-05-02 DIAGNOSIS — J449 Chronic obstructive pulmonary disease, unspecified: Secondary | ICD-10-CM | POA: Diagnosis not present

## 2017-05-02 DIAGNOSIS — K219 Gastro-esophageal reflux disease without esophagitis: Secondary | ICD-10-CM | POA: Diagnosis not present

## 2017-05-02 NOTE — Progress Notes (Signed)
Daily Session Note  Patient Details  Name: Hannah Barron MRN: [id number] Date of Birth: [birth year] Referring Provider:     Pulmonary Rehab Walk Test from 02/21/2017 in [place]  Referring Provider  Dr. Vaughan Browner      Encounter Date: 05/02/2017  Check In: Session Check In - 05/02/17 1211      Check-In   Location  [facility]    Staff Present  Rodney Langton, RN;Efstathios Sawin Wilber Oliphant, RN, BSN;Molly DiVincenzo, MS, ACSM RCEP, Exercise Physiologist    Supervising physician immediately available to respond to emergencies  [facility] immediately available    Physician(s)  Dr. Maylene Roes    Medication changes reported      No    Fall or balance concerns reported     No    Tobacco Cessation  No Change    Warm-up and Cool-down  Performed as group-led instruction    Resistance Training Performed  Yes    VAD Patient?  No      Pain Assessment   Currently in Pain?  No/denies    Multiple Pain Sites  No       Capillary Blood Glucose: No results found for this or any previous visit (from the past 24 hour(s)).    Social History   Tobacco Use  Smoking Status Never Smoker  Smokeless Tobacco Never Used    Goals Met:  Proper associated with RPD/PD & O2 Sat Independence with exercise equipment Exercise tolerated well Encouraged to increse fluid intake   Goals Unmet:  Not Applicable  Comments:  Service time is from 1052  to 1205 Patient in today for pulmonary rehab. Pt reports feeling "bad".  Further probing, pt complains of no energy and tiring easily.  Pt vital signs obtained.  Pt bp 92/60.  Asked pt about her food intake for breakfast.  Pt responded "I ate the same thing I always do"  Unable to provide specifics.  Pt stated she drank "some water". Provided pt with gatorade that she quickly drank and asked for more.  Pt given a cup of water which she also drank quickly. Pt proceed with warm up and first station of exercise on the treadmill.  Pt  bp was 128/60.  Pt stated that she liked the gatorade very much and could she have another one.  Pt given second gatorade and proceeded to the second station on the stepper bp 118/70.  Pt asked for additional h20.  Exit bp  100/68 which is normal for her.  Pt stated that she felt better but was concerned of why her energy level is low.  Pt has a appt for physical on tomorrow.  Will route this note for review. Maurice Small RN, BSN Cardiac and Pulmonary Rehab Nurse Navigator     Dr. Rush Farmer is Medical Director for Pulmonary Rehab at [facility].

## 2017-05-03 ENCOUNTER — Other Ambulatory Visit (INDEPENDENT_AMBULATORY_CARE_PROVIDER_SITE_OTHER): Payer: Medicare HMO

## 2017-05-03 ENCOUNTER — Encounter: Payer: Self-pay | Admitting: Nurse Practitioner

## 2017-05-03 ENCOUNTER — Ambulatory Visit (INDEPENDENT_AMBULATORY_CARE_PROVIDER_SITE_OTHER): Payer: Medicare HMO | Admitting: Nurse Practitioner

## 2017-05-03 VITALS — BP 112/68 | HR 92 | Temp 99.0°F | Resp 16 | Ht 67.0 in | Wt 176.6 lb

## 2017-05-03 DIAGNOSIS — Z13220 Encounter for screening for lipoid disorders: Secondary | ICD-10-CM

## 2017-05-03 DIAGNOSIS — R0600 Dyspnea, unspecified: Secondary | ICD-10-CM | POA: Diagnosis not present

## 2017-05-03 DIAGNOSIS — R300 Dysuria: Secondary | ICD-10-CM

## 2017-05-03 DIAGNOSIS — Z0001 Encounter for general adult medical examination with abnormal findings: Secondary | ICD-10-CM | POA: Diagnosis not present

## 2017-05-03 DIAGNOSIS — Z1231 Encounter for screening mammogram for malignant neoplasm of breast: Secondary | ICD-10-CM

## 2017-05-03 DIAGNOSIS — R7309 Other abnormal glucose: Secondary | ICD-10-CM | POA: Diagnosis not present

## 2017-05-03 DIAGNOSIS — R5383 Other fatigue: Secondary | ICD-10-CM

## 2017-05-03 DIAGNOSIS — K2100 Gastro-esophageal reflux disease with esophagitis, without bleeding: Secondary | ICD-10-CM

## 2017-05-03 DIAGNOSIS — R140 Abdominal distension (gaseous): Secondary | ICD-10-CM

## 2017-05-03 DIAGNOSIS — Z1239 Encounter for other screening for malignant neoplasm of breast: Secondary | ICD-10-CM

## 2017-05-03 DIAGNOSIS — R739 Hyperglycemia, unspecified: Secondary | ICD-10-CM

## 2017-05-03 LAB — COMPREHENSIVE METABOLIC PANEL
ALBUMIN: 3.8 g/dL (ref 3.5–5.2)
ALT: 14 U/L (ref 0–35)
AST: 15 U/L (ref 0–37)
Alkaline Phosphatase: 68 U/L (ref 39–117)
BUN: 10 mg/dL (ref 6–23)
CALCIUM: 9.2 mg/dL (ref 8.4–10.5)
CHLORIDE: 104 meq/L (ref 96–112)
CO2: 23 mEq/L (ref 19–32)
Creatinine, Ser: 0.68 mg/dL (ref 0.40–1.20)
GFR: 98.51 mL/min (ref >60.00)
Glucose, Bld: 100 mg/dL — ABNORMAL HIGH (ref 70–99)
POTASSIUM: 3.4 meq/L — AB (ref 3.5–5.1)
SODIUM: 136 meq/L (ref 135–145)
Total Bilirubin: 0.2 mg/dL (ref 0.2–1.2)
Total Protein: 7.3 g/dL (ref 6.0–8.3)

## 2017-05-03 LAB — URINALYSIS, ROUTINE W REFLEX MICROSCOPIC
BILIRUBIN URINE: NEGATIVE
Hgb urine dipstick: NEGATIVE
KETONES UR: NEGATIVE
Nitrite: NEGATIVE
Specific Gravity, Urine: 1.01 (ref 1.000–1.030)
Total Protein, Urine: NEGATIVE
UROBILINOGEN UA: 0.2 (ref 0.0–1.0)
Urine Glucose: NEGATIVE
pH: 6 (ref 5.0–8.0)

## 2017-05-03 LAB — HEMOGLOBIN A1C: Hgb A1c MFr Bld: 6 % (ref 4.6–6.5)

## 2017-05-03 LAB — LIPID PANEL
CHOLESTEROL: 195 mg/dL (ref 0–200)
HDL: 55.1 mg/dL (ref >39.00)
LDL Cholesterol: 114 mg/dL — ABNORMAL HIGH (ref 0–99)
NonHDL: 139.66
TRIGLYCERIDES: 128 mg/dL (ref 0.0–149.0)
Total CHOL/HDL Ratio: 4
VLDL: 25.6 mg/dL (ref 0.0–40.0)

## 2017-05-03 LAB — VITAMIN D 25 HYDROXY (VIT D DEFICIENCY, FRACTURES): VITD: 28.21 ng/mL — ABNORMAL LOW (ref 30.00–100.00)

## 2017-05-03 LAB — CBC WITH DIFFERENTIAL/PLATELET
BASOS PCT: 0.9 % (ref 0.0–3.0)
Basophils Absolute: 0 10*3/uL (ref 0.0–0.1)
EOS PCT: 1.5 % (ref 0.0–5.0)
Eosinophils Absolute: 0.1 10*3/uL (ref 0.0–0.7)
HCT: 36.5 % (ref 36.0–46.0)
HEMOGLOBIN: 12.4 g/dL (ref 12.0–15.0)
Lymphocytes Relative: 36.6 % (ref 12.0–46.0)
Lymphs Abs: 1.7 10*3/uL (ref 0.7–4.0)
MCHC: 33.9 g/dL (ref 30.0–36.0)
MCV: 95.2 fl (ref 78.0–100.0)
MONOS PCT: 9 % (ref 3.0–12.0)
Monocytes Absolute: 0.4 10*3/uL (ref 0.1–1.0)
Neutro Abs: 2.4 10*3/uL (ref 1.4–7.7)
Neutrophils Relative %: 52 % (ref 43.0–77.0)
Platelets: 257 10*3/uL (ref 150.0–400.0)
RBC: 3.84 Mil/uL — ABNORMAL LOW (ref 3.87–5.11)
RDW: 13 % (ref 11.5–15.5)
WBC: 4.6 10*3/uL (ref 4.0–10.5)

## 2017-05-03 LAB — MAGNESIUM: MAGNESIUM: 1.8 mg/dL (ref 1.5–2.5)

## 2017-05-03 MED ORDER — OMEPRAZOLE 40 MG PO CPDR: 40.0000 mg | DELAYED_RELEASE_CAPSULE | Freq: Every day | ORAL | 0 refills | Status: DC

## 2017-05-03 NOTE — Assessment & Plan Note (Signed)
-  USPSTF grade A and B recommendations reviewed with patient; age-appropriate recommendations, preventive care, screening tests, etc discussed and encouraged; healthy living encouraged; see AVS for patient education given to patient -Discussed importance of 150 minutes of physical activity weekly,eat 6 servings of fruit/vegetables daily and drink plenty of water and avoid sweet beverages.  -Red flags and when to present for emergency care or RTC including fever >101.97F, chest pain, shortness of breath, new/worsening/un-resolving symptoms, reviewed with patient at time of visit. Follow up and care instructions discussed and provided in AVS.  -Reviewed Health Maintenance: up to date Screening for breast cancer - MM DIGITAL SCREENING BILATERAL; Future  Screening for cholesterol level She is not fasting today, would prefer to have labs today - Lipid panel; Future

## 2017-05-03 NOTE — Patient Instructions (Addendum)
Please call Dr Loletha Carrow office to follow up on your abdominal pain- it looks like they wanted to hear back from you.  I have placed a referral to [facility] for your mammogram. Our office will call you to schedule this appointment. You should hear from our office in 7-10 days.  Please head downstairs for lab work/x-rays. If any of your test results are critically abnormal, you will be contacted right away. Your results may be released to your MyChart for viewing before I am able to provide you with my response. I will contact you within a week about your test results and any recommendations for abnormalities.   Preventive Care 40-64 Years, Female Preventive care refers to lifestyle choices and visits with your health care provider that can promote health and wellness. What does preventive care include?  A yearly physical exam. This is also called an annual well check.  Dental exams once or twice a year.  Routine eye exams. Ask your health care provider how often you should have your eyes checked.  Personal lifestyle choices, including: ? Daily care of your teeth and gums. ? Regular physical activity. ? Eating a healthy diet. ? Avoiding tobacco and drug use. ? Limiting alcohol use. ? Practicing safe sex. ? Taking low-dose aspirin daily starting at age 54. ? Taking vitamin and mineral supplements as recommended by your health care provider. What happens during an annual well check? The services and screenings done by your health care provider during your annual well check will depend on your age, overall health, lifestyle risk factors, and family history of disease. Counseling Your health care provider may ask you questions about your:  Alcohol use.  Tobacco use.  Drug use.  Emotional well-being.  Home and relationship well-being.  Sexual activity.  Eating habits.  Work and work Statistician.  Method of birth control.  Menstrual cycle.  Pregnancy  history.  Screening You may have the following tests or measurements:  Height, weight, and BMI.  Blood pressure.  Lipid and cholesterol levels. These may be checked every 5 years, or more frequently if you are over 44 years old.  Skin check.  Lung cancer screening. You may have this screening every year starting at age 70 if you have a 30-pack-year history of smoking and currently smoke or have quit within the past 15 years.  Fecal occult blood test (FOBT) of the stool. You may have this test every year starting at age 26.  Flexible sigmoidoscopy or colonoscopy. You may have a sigmoidoscopy every 5 years or a colonoscopy every 10 years starting at age 5.  Hepatitis C blood test.  Hepatitis B blood test.  Sexually transmitted disease (STD) testing.  Diabetes screening. This is done by checking your blood sugar (glucose) after you have not eaten for a while (fasting). You may have this done every 1-3 years.  Mammogram. This may be done every 1-2 years. Talk to your health care provider about when you should start having regular mammograms. This may depend on whether you have a family history of breast cancer.  BRCA-related cancer screening. This may be done if you have a family history of breast, ovarian, tubal, or peritoneal cancers.  Pelvic exam and Pap test. This may be done every 3 years starting at age 55. Starting at age 23, this may be done every 5 years if you have a Pap test in combination with an HPV test.  Bone density scan. This is done to screen for osteoporosis. You  may have this scan if you are at high risk for osteoporosis.  Discuss your test results, treatment options, and if necessary, the need for more tests with your health care provider. Vaccines Your health care provider may recommend certain vaccines, such as:  Influenza vaccine. This is recommended every year.  Tetanus, diphtheria, and acellular pertussis (Tdap, Td) vaccine. You may need a Td booster  every 10 years.  Varicella vaccine. You may need this if you have not been vaccinated.  Zoster vaccine. You may need this after age 37.  Measles, mumps, and rubella (MMR) vaccine. You may need at least one dose of MMR if you were born in 1957 or later. You may also need a second dose.  Pneumococcal 13-valent conjugate (PCV13) vaccine. You may need this if you have certain conditions and were not previously vaccinated.  Pneumococcal polysaccharide (PPSV23) vaccine. You may need one or two doses if you smoke cigarettes or if you have certain conditions.  Meningococcal vaccine. You may need this if you have certain conditions.  Hepatitis A vaccine. You may need this if you have certain conditions or if you travel or work in places where you may be exposed to hepatitis A.  Hepatitis B vaccine. You may need this if you have certain conditions or if you travel or work in places where you may be exposed to hepatitis B.  Haemophilus influenzae type b (Hib) vaccine. You may need this if you have certain conditions.  Talk to your health care provider about which screenings and vaccines you need and how often you need them. This information is not intended to replace advice given to you by your health care provider. Make sure you discuss any questions you have with your health care provider. Document Released: 01/23/2015 Document Revised: 09/16/2015 Document Reviewed: 10/28/2014 Elsevier Interactive Patient Education  [employer].

## 2017-05-03 NOTE — Progress Notes (Signed)
Name: Hannah Barron   MRN: [id number]    DOB: 1971-01-04   Date:05/03/2017       Progress Note  Subjective  Chief Complaint  Chief Complaint  Patient presents with  . CPE    not fasting    HPI  Patient presents for annual CPE.  Diet: home cooked meals Exercise: pulmonary rehab twice a week, sometimes works out at [place] and [facility] recommendations  Depression: No concerns Depression screen [facility] 2/9 02/17/2017 12/29/2016 08/23/2016 04/21/2016 04/01/2016  Decreased Interest 0 1 - 3 3  Down, Depressed, Hopeless 0 2 1 2  0  PHQ - 2 Score 0 3 1 5 3   Altered sleeping - 0 0 0 3  Tired, decreased energy - 3 2 2 2   Change in appetite - 3 0 3 3  Feeling bad or failure about yourself  - 0 0 0 0  Trouble concentrating - 2 0 0 2  Moving slowly or fidgety/restless - - 0 0 0  Suicidal thoughts - 0 0 0 0  PHQ-9 Score - 11 3 10 13    Hypertension: BP Readings from Last 3 Encounters:  05/03/17 112/68  05/02/17 92/60  03/25/17 109/63   Obesity: Wt Readings from Last 3 Encounters:  05/03/17 176 lb 9.6 oz (80.1 kg)  05/02/17 175 lb 14.8 oz (79.8 kg)  04/25/17 176 lb 2.4 oz (79.9 kg)   BMI Readings from Last 3 Encounters:  05/03/17 27.66 kg/m  05/02/17 27.55 kg/m  04/25/17 27.59 kg/m    Alcohol: no  Tobacco use: no ,never HIV screening: up to date STD testing and prevention (chl/gon/syphilis): declines, no concerns Intimate partner violence: denies, feels safe Menstrual History/LMP/Abnormal Bleeding: follows with GYN for womens healthcare needs Incontinence Symptoms: denies incontinence  VACCINATIONS: up to date  Advanced Care Planning: A voluntary discussion about advance care planning including the explanation and discussion of advance directives.  Discussed health care proxy and Living will, and the patient DOES NOT have a living will at present time. If patient does have living will, I have requested they bring this to the clinic to be scanned in to their chart. I have  provided advanced directive handout to patient.  Breast cancer: screening mammogram ordered today Cervical cancer screening: follows with GYN, up to date  Lipids:  Lab Results  Component Value Date   CHOL 194 04/28/2016   Lab Results  Component Value Date   HDL 62.00 04/28/2016   Lab Results  Component Value Date   LDLCALC 111 (H) 04/28/2016   Lab Results  Component Value Date   TRIG 108.0 04/28/2016   Lab Results  Component Value Date   CHOLHDL 3 04/28/2016   No results found for: LDLDIRECT  Glucose:  Glucose  Date Value Ref Range Status  08/24/2016 88 65 - 99 mg/dL Final   Glucose, Bld  Date Value Ref Range Status  12/28/2016 87 70 - 99 mg/dL Final  09/12/2016 113 (H) 65 - 99 mg/dL Final  04/28/2016 104 (H) 70 - 99 mg/dL Final    Skin cancer: no concerning lesions or moles, follows with dermatology routinely   Aspirin: not indicated ECG: not indicated   Patient Active Problem List   Diagnosis Date Noted  . Acne vulgaris 12/28/2016  . Bronchiectasis with acute exacerbation ([place]) 12/22/2016  . Medicare annual wellness visit, subsequent 04/28/2016  . Routine adult health maintenance 04/28/2016  . Fibroids, intramural 04/01/2016  . Abnormal uterine bleeding (AUB) 04/01/2016  . Lung nodules 03/16/2016  .  Palpitations   . GERD (gastroesophageal reflux disease) 07/30/2015  . Bronchiectasis without acute exacerbation ([place]) 07/08/2015  . COPD (chronic obstructive pulmonary disease) ([place]) 06/04/2015  . Fatigue 06/04/2015    Past Surgical History:  Procedure Laterality Date  . CESAREAN SECTION    . CYSTOSCOPY N/A 04/05/2016   Procedure: CYSTOSCOPY;  Surgeon: Lavonia Drafts, MD;  Location: [facility];  Service: Gynecology;  Laterality: N/A;  . LAPAROSCOPIC VAGINAL HYSTERECTOMY WITH SALPINGECTOMY Bilateral 04/05/2016   Procedure: LAPAROSCOPIC ASSISTED VAGINAL HYSTERECTOMY WITH SALPINGECTOMY;  Surgeon: Lavonia Drafts, MD;  Location: [facility];  Service:  Gynecology;  Laterality: Bilateral;  . LUNG BIOPSY    . VIDEO BRONCHOSCOPY Bilateral 03/29/2016   Procedure: VIDEO BRONCHOSCOPY WITHOUT FLUORO;  Surgeon: Marshell Garfinkel, MD;  Location: [facility];  Service: Cardiopulmonary;  Laterality: Bilateral;    Family History  Problem Relation Age of Onset  . Healthy Mother   . Healthy Father     Social History   Socioeconomic History  . Marital status: Married    Spouse name: Not on file  . Number of children: 1  . Years of education: 84  . Highest education level: Not on file  Occupational History  . Occupation: disability  Social Needs  . Financial resource strain: Not on file  . Food insecurity:    Worry: Not on file    Inability: Not on file  . Transportation needs:    Medical: Not on file    Non-medical: Not on file  Tobacco Use  . Smoking status: Never Smoker  . Smokeless tobacco: Never Used  Substance and Sexual Activity  . Alcohol use: No    Alcohol/week: 0.0 oz  . Drug use: No  . Sexual activity: Yes  Lifestyle  . Physical activity:    Days per week: Not on file    Minutes per session: Not on file  . Stress: Not on file  Relationships  . Social connections:    Talks on phone: Not on file    Gets together: Not on file    Attends religious service: Not on file    Active member of club or organization: Not on file    Attends meetings of clubs or organizations: Not on file    Relationship status: Not on file  . Intimate partner violence:    Fear of current or ex partner: Not on file    Emotionally abused: Not on file    Physically abused: Not on file    Forced sexual activity: Not on file  Other Topics Concern  . Not on file  Social History Narrative   Fun: Watch TV     Current Outpatient Medications:  .  albuterol (VENTOLIN HFA) 108 (90 Base) MCG/ACT inhaler, Inhale 2 puffs into the lungs every 6 (six) hours as needed for wheezing or shortness of breath., Disp: 3 Inhaler, Rfl: 3 .  busPIRone (BUSPAR) 15  MG tablet, Take 1 tablet (15 mg total) by mouth at bedtime., Disp: 30 tablet, Rfl: 1 .  dicyclomine (BENTYL) 20 MG tablet, TAKE 1 TABLET (20 MG TOTAL) BY MOUTH DAILY AS NEEDED FOR SPASMS., Disp: 30 tablet, Rfl: 0 .  estradiol (ESTRACE) 0.5 MG tablet, Take 1 tablet (0.5 mg total) by mouth daily., Disp: 30 tablet, Rfl: 6 .  ibuprofen (ADVIL,MOTRIN) 400 MG tablet, Take 1 tablet (400 mg total) by mouth every 6 (six) hours as needed., Disp: 90 tablet, Rfl: 0 .  metoprolol succinate (TOPROL-XL) 50 MG 24 hr tablet, Take 1 tablet (50mg )  in morning and 1/2 tablet (25mg ) in evening with or immediately following a meal. (Patient taking differently: Take 25-50 mg by mouth See admin instructions. 50 mg in the morning and 25 mg in the evening), Disp: 135 tablet, Rfl: 1 .  omeprazole (PRILOSEC) 40 MG capsule, Take 1 capsule (40 mg total) by mouth daily., Disp: 90 capsule, Rfl: 0 .  Respiratory Therapy Supplies (FLUTTER) DEVI, Use as directed., Disp: 1 each, Rfl: 0 .  RESTASIS 0.05 % ophthalmic emulsion, Instill 1 drop into both eyes two times a day, Disp: , Rfl: 2 .  SYMBICORT 160-4.5 MCG/ACT inhaler, TAKE 2 PUFFS BY MOUTH TWICE A DAY, Disp: 10.2 Inhaler, Rfl: 3 .  trimethoprim-polymyxin b (POLYTRIM) ophthalmic solution, Place 1 drop into the left eye every 4 (four) hours., Disp: 10 mL, Rfl: 0  Current Facility-Administered Medications:  .  0.9 %  sodium chloride infusion, 500 mL, Intravenous, Once, Danis, Estill Cotta III, MD  No Known Allergies   ROS  Constitutional: Negative for fever or weight change.Positive for fatigue. Respiratory: Negative for cough. Positive for shortness of breath.   Cardiovascular: Negative for chest pain or palpitations.  Gastrointestinal: Negative for bowel changes. Positive for abdominal pain and bloating. Genitourinary: Positive for dysuria, urinary frequency. Negative for hematuria, vaginal discharge. Musculoskeletal: Negative for gait problem or joint swelling.  Skin: Negative  for rash.  Neurological: Negative for weakness, dizziness, or headache.  No other specific complaints in a complete review of systems (except as listed in HPI above).  Shortness of breath- chronic, currently following with pulmonology and pulmonary rehab  Abdominal bloating- this is not a new problem, this has been ongoing for some time She feels abdominal bloating after meals  She is following with gastroenterology for workup   Objective  Vitals:   05/03/17 0930  BP: 112/68  Pulse: 92  Resp: 16  Temp: 99 F (37.2 C)  TempSrc: Oral  SpO2: 98%  Weight: 176 lb 9.6 oz (80.1 kg)  Height: 5\' 7"  (1.702 m)    Body mass index is 27.66 kg/m.  Physical Exam Vital signs reviewed. Constitutional: Patient appears well-developed and well-nourished. No distress.  HENT: Head: Normocephalic and atraumatic. Ears: B TMs ok, no erythema or effusion; Nose: Nose normal. Mouth/Throat: Oropharynx is clear and moist. No oropharyngeal exudate.  Eyes: Conjunctivae and EOM are normal. Pupils are equal, round, and reactive to light. No scleral icterus.  Neck: Normal range of motion. Neck supple. No thyromegaly present.  Cardiovascular: Normal rate, regular rhythm and normal heart sounds.  No murmur heard. No BLE edema. Pulmonary/Chest: Effort normal. No respiratory distress. Abdominal: Soft. Bowel sounds are normal, no distension. Mild tenderness to lower abdomen. no masses. Musculoskeletal: Normal range of motion, no joint effusions. No gross deformities Neurological: She is alert and oriented to person, place, and time. No cranial nerve deficit. Coordination, balance, strength, speech and gait are normal.  Skin: Skin is warm and dry. No rash noted. No erythema.  Psychiatric: Patient has a normal mood and affect. behavior is normal. Judgment and thought content normal.   PHQ2/9: Depression screen [facility] 2/9 02/17/2017 12/29/2016 08/23/2016 04/21/2016 04/01/2016  Decreased Interest 0 1 - 3 3  Down,  Depressed, Hopeless 0 2 1 2  0  PHQ - 2 Score 0 3 1 5 3   Altered sleeping - 0 0 0 3  Tired, decreased energy - 3 2 2 2   Change in appetite - 3 0 3 3  Feeling bad or failure about yourself  - 0  0 0 0  Trouble concentrating - 2 0 0 2  Moving slowly or fidgety/restless - - 0 0 0  Suicidal thoughts - 0 0 0 0  PHQ-9 Score - 11 3 10 13     Fall Risk: Fall Risk  02/17/2017  Falls in the past year? No    Assessment & Plan F/U TBD pending labs   Gastroesophageal reflux disease with esophagitis Continue F/U with gastroenterology - Magnesium; Future  Elevated random blood glucose level - Hemoglobin A1c; Future  Other fatigue Labs today, F/u with additional recommendations pending lab results - TSH NL on 12/28/16 - Vitamin D 25 hydroxy - Hemoglobin A1c; Future - CBC with Differential/Platelet; Future - Comprehensive metabolic panel; Future  Abdominal bloating Continue f/u with gastroenterology  Dysuria Labs today, F/u with additional recommendations pending lab results - Urinalysis; Future - Urine Culture; Future  Dyspnea, unspecified type Stable, Continue F/u with pulmonology

## 2017-05-04 ENCOUNTER — Encounter (HOSPITAL_COMMUNITY): Payer: Medicare HMO

## 2017-05-04 ENCOUNTER — Telehealth (HOSPITAL_COMMUNITY): Payer: Self-pay

## 2017-05-04 ENCOUNTER — Other Ambulatory Visit: Payer: Self-pay | Admitting: Nurse Practitioner

## 2017-05-04 DIAGNOSIS — Z1231 Encounter for screening mammogram for malignant neoplasm of breast: Secondary | ICD-10-CM

## 2017-05-04 NOTE — Telephone Encounter (Signed)
Patient called and stated she will not be attending class today as she is sick.

## 2017-05-06 ENCOUNTER — Encounter: Payer: Self-pay | Admitting: Nurse Practitioner

## 2017-05-08 ENCOUNTER — Other Ambulatory Visit: Payer: Medicare HMO

## 2017-05-09 ENCOUNTER — Other Ambulatory Visit: Payer: Self-pay | Admitting: Nurse Practitioner

## 2017-05-09 ENCOUNTER — Encounter (HOSPITAL_COMMUNITY)
Admission: RE | Admit: 2017-05-09 | Discharge: 2017-05-09 | Disposition: A | Discharge status: Home / Self Care | Payer: Medicare HMO | Source: Ambulatory Visit | Attending: Pulmonary Disease | Admitting: Pulmonary Disease

## 2017-05-09 VITALS — Wt 176.1 lb

## 2017-05-09 DIAGNOSIS — J479 Bronchiectasis, uncomplicated: Secondary | ICD-10-CM | POA: Diagnosis not present

## 2017-05-09 DIAGNOSIS — J449 Chronic obstructive pulmonary disease, unspecified: Secondary | ICD-10-CM | POA: Diagnosis not present

## 2017-05-09 DIAGNOSIS — G43909 Migraine, unspecified, not intractable, without status migrainosus: Secondary | ICD-10-CM | POA: Diagnosis not present

## 2017-05-09 DIAGNOSIS — K219 Gastro-esophageal reflux disease without esophagitis: Secondary | ICD-10-CM | POA: Diagnosis not present

## 2017-05-09 MED ORDER — SULFAMETHOXAZOLE-TRIMETHOPRIM 800-160 MG PO TABS: 1.0000 | ORAL_TABLET | Freq: Two times a day (BID) | ORAL | 0 refills | Status: DC

## 2017-05-09 NOTE — Progress Notes (Signed)
orders

## 2017-05-09 NOTE — Progress Notes (Signed)
Daily Session Note  Patient Details  Name: Hannah Barron MRN: [id number] Date of Birth: 31-Oct-1970 Referring Provider:     Pulmonary Rehab Walk Test from 02/21/2017 in [place]  Referring Provider  Dr. Vaughan Browner      Encounter Date: 05/09/2017  Check In: Session Check In - 05/09/17 1206      Check-In   Location  [facility]    Staff Present  Rodney Langton, RN;Carlette Wilber Oliphant, RN, BSN;Molly DiVincenzo, MS, ACSM RCEP, Exercise Physiologist;Portia Rollene Rotunda, RN, BSN    Supervising physician immediately available to respond to emergencies  [facility] immediately available    Physician(s)  Dr. Maylene Roes    Medication changes reported      No    Fall or balance concerns reported     No    Tobacco Cessation  No Change    Warm-up and Cool-down  Performed as group-led instruction    Resistance Training Performed  Yes    VAD Patient?  No      Pain Assessment   Currently in Pain?  No/denies    Multiple Pain Sites  No       Capillary Blood Glucose: No results found for this or any previous visit (from the past 24 hour(s)).  Exercise Prescription Changes - 05/09/17 1200      Response to Exercise   Blood Pressure (Admit)  106/64    Blood Pressure (Exercise)  124/64    Blood Pressure (Exit)  100/60    Heart Rate (Admit)  99 bpm    Heart Rate (Exercise)  115 bpm    Heart Rate (Exit)  86 bpm    Oxygen Saturation (Admit)  100 %    Oxygen Saturation (Exercise)  95 %    Oxygen Saturation (Exit)  99 %    Rating of Perceived Exertion (Exercise)  13    Perceived Dyspnea (Exercise)  2    Duration  Continue with 45 min of aerobic exercise without signs/symptoms of physical distress.      Progression   Progression  Continue to progress workloads to maintain intensity without signs/symptoms of physical distress.      Resistance Training   Training Prescription  Yes    Weight  orange bands    Reps  10-15    Time  10 Minutes      Treadmill   MPH  3.2    Grade  3    Minutes  17      Bike   Level  0.6    Minutes  17      NuStep   Level  5    Minutes  17    METs  2.5       Social History   Tobacco Use  Smoking Status Never Smoker  Smokeless Tobacco Never Used    Goals Met:  Exercise tolerated well No report of cardiac concerns or symptoms Strength training completed today  Goals Unmet:  Not Applicable  Comments: Service time is from 1030 to 1200    Dr. Rush Farmer is Medical Director for Pulmonary Rehab at [facility].

## 2017-05-11 ENCOUNTER — Encounter (HOSPITAL_COMMUNITY)
Admission: RE | Admit: 2017-05-11 | Discharge: 2017-05-11 | Disposition: A | Discharge status: Home / Self Care | Payer: Medicare HMO | Source: Ambulatory Visit | Attending: Pulmonary Disease | Admitting: Pulmonary Disease

## 2017-05-11 DIAGNOSIS — J449 Chronic obstructive pulmonary disease, unspecified: Secondary | ICD-10-CM | POA: Insufficient documentation

## 2017-05-11 DIAGNOSIS — G43909 Migraine, unspecified, not intractable, without status migrainosus: Secondary | ICD-10-CM | POA: Diagnosis not present

## 2017-05-11 DIAGNOSIS — K219 Gastro-esophageal reflux disease without esophagitis: Secondary | ICD-10-CM | POA: Insufficient documentation

## 2017-05-11 DIAGNOSIS — J479 Bronchiectasis, uncomplicated: Secondary | ICD-10-CM | POA: Diagnosis present

## 2017-05-11 NOTE — Progress Notes (Signed)
Daily Session Note  Patient Details  Name: Hannah Barron MRN: [id number] Date of Birth: 09-14-1970 Referring Provider:     Pulmonary Rehab Walk Test from 02/21/2017 in [place]  Referring Provider  Dr. Vaughan Browner      Encounter Date: 05/11/2017  Check In: Session Check In - 05/11/17 1105      Check-In   Location  [facility]    Staff Present  Rodney Langton, RN;Cristiana Yochim, MS, ACSM RCEP, Exercise Physiologist;Portia Rollene Rotunda, RN, BSN;Other    Supervising physician immediately available to respond to emergencies  [facility] immediately available    Physician(s)  Dr. Reesa Chew    Medication changes reported      No    Fall or balance concerns reported     No    Tobacco Cessation  No Change    Warm-up and Cool-down  Performed as group-led instruction    Resistance Training Performed  Yes    VAD Patient?  No      Pain Assessment   Currently in Pain?  No/denies    Multiple Pain Sites  No       Capillary Blood Glucose: No results found for this or any previous visit (from the past 24 hour(s)).    Social History   Tobacco Use  Smoking Status Never Smoker  Smokeless Tobacco Never Used    Goals Met:  Exercise tolerated well No report of cardiac concerns or symptoms Strength training completed today  Goals Unmet:  Not Applicable  Comments: Service time is from 10:30a to 12:30p    Dr. Rush Farmer is Medical Director for Pulmonary Rehab at [facility].

## 2017-05-16 ENCOUNTER — Encounter (HOSPITAL_COMMUNITY)
Admission: RE | Admit: 2017-05-16 | Discharge: 2017-05-16 | Disposition: A | Discharge status: Home / Self Care | Payer: Medicare HMO | Source: Ambulatory Visit | Attending: Pulmonary Disease | Admitting: Pulmonary Disease

## 2017-05-16 DIAGNOSIS — G43909 Migraine, unspecified, not intractable, without status migrainosus: Secondary | ICD-10-CM | POA: Diagnosis not present

## 2017-05-16 DIAGNOSIS — J479 Bronchiectasis, uncomplicated: Secondary | ICD-10-CM

## 2017-05-16 DIAGNOSIS — K219 Gastro-esophageal reflux disease without esophagitis: Secondary | ICD-10-CM | POA: Diagnosis not present

## 2017-05-16 DIAGNOSIS — J449 Chronic obstructive pulmonary disease, unspecified: Secondary | ICD-10-CM | POA: Diagnosis not present

## 2017-05-16 NOTE — Progress Notes (Signed)
Daily Session Note  Patient Details  Name: Hannah Barron MRN: [id number] Date of Birth: 1970/08/16 Referring Provider:     Pulmonary Rehab Walk Test from 02/21/2017 in [place]  Referring Provider  Dr. Vaughan Browner      Encounter Date: 05/16/2017  Check In: Session Check In - 05/16/17 1215      Check-In   Location  [facility]    Staff Present  Rodney Langton, RN;Molly DiVincenzo, MS, ACSM RCEP, Exercise Physiologist;Kathyrn [place] Rollene Rotunda, RN, BSN;Carlette Wilber Oliphant, RN, BSN    Supervising physician immediately available to respond to emergencies  [facility] immediately available    Physician(s)  Dr. Reesa Chew    Medication changes reported      No    Fall or balance concerns reported     No    Tobacco Cessation  No Change    Warm-up and Cool-down  Performed as group-led instruction    Resistance Training Performed  Yes    VAD Patient?  No      Pain Assessment   Currently in Pain?  No/denies    Multiple Pain Sites  No       Capillary Blood Glucose: No results found for this or any previous visit (from the past 24 hour(s)).    Social History   Tobacco Use  Smoking Status Never Smoker  Smokeless Tobacco Never Used    Goals Met:  Independence with exercise equipment Using PLB without cueing & demonstrates good technique Exercise tolerated well No report of cardiac concerns or symptoms Strength training completed today  Goals Unmet:  Not Applicable  Comments: Service time is from 1030 to 1200   Dr. Rush Farmer is Medical Director for Pulmonary Rehab at [facility].

## 2017-05-18 ENCOUNTER — Encounter (HOSPITAL_COMMUNITY)
Admission: RE | Admit: 2017-05-18 | Discharge: 2017-05-18 | Disposition: A | Discharge status: Home / Self Care | Payer: Medicare HMO | Source: Ambulatory Visit | Attending: Pulmonary Disease | Admitting: Pulmonary Disease

## 2017-05-18 DIAGNOSIS — G43909 Migraine, unspecified, not intractable, without status migrainosus: Secondary | ICD-10-CM | POA: Diagnosis not present

## 2017-05-18 DIAGNOSIS — J479 Bronchiectasis, uncomplicated: Secondary | ICD-10-CM

## 2017-05-18 DIAGNOSIS — K219 Gastro-esophageal reflux disease without esophagitis: Secondary | ICD-10-CM | POA: Diagnosis not present

## 2017-05-18 DIAGNOSIS — J449 Chronic obstructive pulmonary disease, unspecified: Secondary | ICD-10-CM | POA: Diagnosis not present

## 2017-05-18 NOTE — Progress Notes (Signed)
Daily Session Note  Patient Details  Name: Hannah Barron MRN: [id number] Date of Birth: 01/10/1971 Referring Provider:     Pulmonary Rehab Walk Test from 02/21/2017 in [place]  Referring Provider  Dr. Vaughan Browner      Encounter Date: 05/18/2017  Check In: Session Check In - 05/18/17 1158      Check-In   Location  [facility]    Staff Present  Cloyde Reams DiVincenzo, MS, ACSM RCEP, Exercise Physiologist;Marry Kusch Rollene Rotunda, RN, BSN;Carlette Carlton, RN, BSN    Supervising physician immediately available to respond to emergencies  [facility] immediately available    Physician(s)  Dr. Stevphen Meuse    Medication changes reported      No    Fall or balance concerns reported     No    Tobacco Cessation  No Change    Warm-up and Cool-down  Performed as group-led instruction    Resistance Training Performed  Yes    VAD Patient?  No      Pain Assessment   Currently in Pain?  No/denies       Capillary Blood Glucose: No results found for this or any previous visit (from the past 24 hour(s)).    Social History   Tobacco Use  Smoking Status Never Smoker  Smokeless Tobacco Never Used    Goals Met:  Exercise tolerated well Queuing for purse lip breathing No report of cardiac concerns or symptoms Strength training completed today  Goals Unmet:  Not Applicable  Comments: Service time is from 1030 to 1210   Dr. Rush Farmer is Medical Director for Pulmonary Rehab at [facility].

## 2017-05-18 NOTE — Progress Notes (Signed)
Pulmonary Individual Treatment Plan  Patient Details  Name: Hannah Barron MRN: [id number] Date of Birth: 15-Jul-1970 Referring Provider:     Pulmonary Rehab Walk Test from 02/21/2017 in [place]  Referring Provider  Dr. Vaughan Browner      Initial Encounter Date:    Pulmonary Rehab Walk Test from 02/21/2017 in [place]  Date  02/21/17  Referring Provider  Dr. Vaughan Browner      Visit Diagnosis: Bronchiectasis without complication ([place])  Patient's Home Medications on Admission:   Current Outpatient Medications:  .  albuterol (VENTOLIN HFA) 108 (90 Base) MCG/ACT inhaler, Inhale 2 puffs into the lungs every 6 (six) hours as needed for wheezing or shortness of breath., Disp: 3 Inhaler, Rfl: 3 .  busPIRone (BUSPAR) 15 MG tablet, Take 1 tablet (15 mg total) by mouth at bedtime., Disp: 30 tablet, Rfl: 1 .  dicyclomine (BENTYL) 20 MG tablet, TAKE 1 TABLET (20 MG TOTAL) BY MOUTH DAILY AS NEEDED FOR SPASMS., Disp: 30 tablet, Rfl: 0 .  estradiol (ESTRACE) 0.5 MG tablet, Take 1 tablet (0.5 mg total) by mouth daily., Disp: 30 tablet, Rfl: 6 .  ibuprofen (ADVIL,MOTRIN) 400 MG tablet, Take 1 tablet (400 mg total) by mouth every 6 (six) hours as needed., Disp: 90 tablet, Rfl: 0 .  metoprolol succinate (TOPROL-XL) 50 MG 24 hr tablet, Take 1 tablet (69m) in morning and 1/2 tablet (278m in evening with or immediately following a meal. (Patient taking differently: Take 25-50 mg by mouth See admin instructions. 50 mg in the morning and 25 mg in the evening), Disp: 135 tablet, Rfl: 1 .  omeprazole (PRILOSEC) 40 MG capsule, Take 1 capsule (40 mg total) by mouth daily., Disp: 90 capsule, Rfl: 0 .  Respiratory Therapy Supplies (FLUTTER) DEVI, Use as directed., Disp: 1 each, Rfl: 0 .  RESTASIS 0.05 % ophthalmic emulsion, Instill 1 drop into both eyes two times a day, Disp: , Rfl: 2 .  sulfamethoxazole-trimethoprim (BACTRIM DS,SEPTRA DS) 800-160 MG tablet, Take 1  tablet by mouth 2 (two) times daily., Disp: 6 tablet, Rfl: 0 .  SYMBICORT 160-4.5 MCG/ACT inhaler, TAKE 2 PUFFS BY MOUTH TWICE A DAY, Disp: 10.2 Inhaler, Rfl: 3 .  trimethoprim-polymyxin b (POLYTRIM) ophthalmic solution, Place 1 drop into the left eye every 4 (four) hours., Disp: 10 mL, Rfl: 0  Current Facility-Administered Medications:  .  0.9 %  sodium chloride infusion, 500 mL, Intravenous, Once, Danis, HeKirke CorinMD  Past Medical History: Past Medical History:  Diagnosis Date  . Anemia   . [place]ct[place] ([facility]  . COPD (chronic obstructive pulmonary disease) ([facility]  . GERD (gastroesophageal reflux disease)   . Migraines   . Paroxysmal SVT (supraventricular tachycardia) ([facility]   a. diagnosed in 11/2015.  . Right leg numbness     Tobacco Use: Social History   Tobacco Use  Smoking Status Never Smoker  Smokeless Tobacco Never Used    Labs: Recent Review Flowsheet Data    Labs for ITP Cardiac and Pulmonary Rehab Latest Ref Rng & Units 06/04/2015 04/28/2016 05/03/2017   Cholestrol 0 - 200 mg/dL - 194 195   LDLCALC 0 - 99 mg/dL - 111(H) 114(H)   HDL >39.00 mg/dL - 62.00 55.10   Trlycerides 0.0 - 149.0 mg/dL - 108.0 128.0   Hemoglobin A1c 4.6 - 6.5 % 5.9 - 6.0      Capillary Blood Glucose: No results found for: GLUCAP   Pulmonary Assessment Scores: Pulmonary  Assessment Scores    Row Name 02/23/17 0710         ADL UCSD   ADL Phase  Entry       mMRC Score   mMRC Score  4        Pulmonary Function Assessment: Pulmonary Function Assessment - 02/17/17 1102      Breath   Bilateral Breath Sounds  Clear;Decreased    Shortness of Breath  Limiting activity;Fear of Shortness of Breath;Yes       Exercise Target Goals:    Exercise Program Goal: Individual exercise prescription set using results from initial 6 min walk test and THRR while considering  patient's activity barriers and safety.    Exercise Prescription Goal: Initial exercise prescription builds to  30-45 minutes a day of aerobic activity, 2-3 days per week.  Home exercise guidelines will be given to patient during program as part of exercise prescription that the participant will acknowledge.  Activity Barriers & Risk Stratification: Activity Barriers & Cardiac Risk Stratification - 02/17/17 1041      Activity Barriers & Cardiac Risk Stratification   Activity Barriers  Deconditioning;Shortness of Breath       6 Minute Walk: 6 Minute Walk    Row Name 02/23/17 0710         6 Minute Walk   Phase  Initial     Distance  1332 feet     Walk Time  6 minutes     # of Rest Breaks  0     MPH  2.52     METS  2.91     RPE  12     Perceived Dyspnea   3     Symptoms  No     Resting HR  71 bpm     Resting BP  138/80     Resting Oxygen Saturation   99 %     Exercise Oxygen Saturation  during 6 min walk  98 %     Max Ex. HR  101 bpm     Max Ex. BP  114/64       Interval HR   1 Minute HR  90     2 Minute HR  91     3 Minute HR  101     4 Minute HR  94     5 Minute HR  101     6 Minute HR  90     2 Minute Post HR  91     Interval Heart Rate?  Yes       Interval Oxygen   Interval Oxygen?  Yes     Baseline Oxygen Saturation %  99 %     1 Minute Oxygen Saturation %  99 %     1 Minute Liters of Oxygen  0 L     2 Minute Oxygen Saturation %  100 %     2 Minute Liters of Oxygen  0 L     3 Minute Oxygen Saturation %  99 %     3 Minute Liters of Oxygen  0 L     4 Minute Oxygen Saturation %  99 %     4 Minute Liters of Oxygen  0 L     5 Minute Oxygen Saturation %  99 %     5 Minute Liters of Oxygen  0 L     6 Minute Oxygen Saturation %  98 %     6 Minute Liters  of Oxygen  0 L     2 Minute Post Oxygen Saturation %  99 %     2 Minute Post Liters of Oxygen  0 L        Oxygen Initial Assessment: Oxygen Initial Assessment - 02/23/17 0710      Initial 6 min Walk   Oxygen Used  None      Program Oxygen Prescription   Program Oxygen Prescription  None       Oxygen  Re-Evaluation: Oxygen Re-Evaluation    Row Name 03/20/17 1343 04/14/17 1116 05/15/17 1603         Program Oxygen Prescription   Program Oxygen Prescription  None  None  None       Home Oxygen   Home Oxygen Device  None  None  None     Sleep Oxygen Prescription  None  None  None     Home Exercise Oxygen Prescription  None  None  None     Home at Rest Exercise Oxygen Prescription  None  None  None        Oxygen Discharge (Final Oxygen Re-Evaluation): Oxygen Re-Evaluation - 05/15/17 1603      Program Oxygen Prescription   Program Oxygen Prescription  None      Home Oxygen   Home Oxygen Device  None    Sleep Oxygen Prescription  None    Home Exercise Oxygen Prescription  None    Home at Rest Exercise Oxygen Prescription  None       Initial Exercise Prescription: Initial Exercise Prescription - 02/23/17 0700      Date of Initial Exercise RX and Referring Provider   Date  02/21/17    Referring Provider  Dr. Vaughan Browner      Treadmill   MPH  1.7    Grade  0    Minutes  17      NuStep   Level  2    SPM  80    Minutes  17    METs  1.5      Rower   Level  2    Watts  10    Minutes  17      Prescription Details   Frequency (times per week)  2    Duration  Progress to 45 minutes of aerobic exercise without signs/symptoms of physical distress      Intensity   THRR 40-80% of Max Heartrate  70-139    Ratings of Perceived Exertion  11-13    Perceived Dyspnea  0-4      Progression   Progression  Continue to progress workloads to maintain intensity without signs/symptoms of physical distress.      Resistance Training   Training Prescription  Yes    Weight  orange bands    Reps  10-15       Perform Capillary Blood Glucose checks as needed.  Exercise Prescription Changes: Exercise Prescription Changes    Row Name 02/28/17 1200 03/09/17 1030 03/09/17 1200 03/30/17 1400 04/11/17 1200     Response to Exercise   Blood Pressure (Admit)  104/60  98/58  -  94/64   108/68   Blood Pressure (Exercise)  138/64  118/70  -  120/70  120/70   Blood Pressure (Exit)  104/70  110/70  -  100/62  100/60   Heart Rate (Admit)  73 bpm  72 bpm  -  79 bpm  90 bpm   Heart Rate (Exercise)  98 bpm  100 bpm  -  118 bpm  114 bpm   Heart Rate (Exit)  81 bpm  71 bpm  -  92 bpm  84 bpm   Oxygen Saturation (Admit)  98 %  99 %  -  98 %  99 %   Oxygen Saturation (Exercise)  98 %  97 %  -  99 %  99 %   Oxygen Saturation (Exit)  100 %  100 %  -  100 %  99 %   Rating of Perceived Exertion (Exercise)  15  15  -  13  15   Perceived Dyspnea (Exercise)  2  1  -  1  2   Duration  Progress to 45 minutes of aerobic exercise without signs/symptoms of physical distress  Progress to 45 minutes of aerobic exercise without signs/symptoms of physical distress  -  Progress to 45 minutes of aerobic exercise without signs/symptoms of physical distress  Progress to 45 minutes of aerobic exercise without signs/symptoms of physical distress   Intensity  Other (comment) 40-80% of HRR  THRR unchanged  -  THRR unchanged  THRR unchanged     Progression   Progression  Continue to progress workloads to maintain intensity without signs/symptoms of physical distress.  Continue to progress workloads to maintain intensity without signs/symptoms of physical distress.  -  Continue to progress workloads to maintain intensity without signs/symptoms of physical distress.  Continue to progress workloads to maintain intensity without signs/symptoms of physical distress.     Resistance Training   Training Prescription  Yes  Yes  -  Yes  Yes   Weight  orange bands  orange bands  -  orange bands  orange bands   Reps  10-15  10-15  -  10-15  10-15   Time  10 Minutes  10 Minutes  -  10 Minutes  10 Minutes     Treadmill   MPH  2.3  2.5  -  3  3.2   Grade  2  3  -  3  3   Minutes  17  17  -  17  17     NuStep   Level  2  -  -  4  5   SPM  -  -  -  80  -   Minutes  17  -  -  17  17   METs  2.2  -  -  2.1  2     Rower    Level  2  2  -  -  3   Minutes  17  17  -  -  17     Home Exercise Plan   Plans to continue exercise at  -  -  [employer] (comment)  -  -   Frequency  -  -  Add 2 additional days to program exercise sessions.  -  -   Row Name 04/25/17 1200 05/09/17 1200           Response to Exercise   Blood Pressure (Admit)  120/50  106/64      Blood Pressure (Exercise)  118/70  124/64      Blood Pressure (Exit)  100/56  100/60      Heart Rate (Admit)  113 bpm  99 bpm      Heart Rate (Exercise)  139 bpm  115 bpm      Heart Rate (Exit)  100 bpm  86 bpm  Oxygen Saturation (Admit)  98 %  100 %      Oxygen Saturation (Exercise)  98 %  95 %      Oxygen Saturation (Exit)  98 %  99 %      Rating of Perceived Exertion (Exercise)  15  13      Perceived Dyspnea (Exercise)  2  2      Duration  Progress to 45 minutes of aerobic exercise without signs/symptoms of physical distress  Continue with 45 min of aerobic exercise without signs/symptoms of physical distress.      Intensity  THRR unchanged  -        Progression   Progression  Continue to progress workloads to maintain intensity without signs/symptoms of physical distress.  Continue to progress workloads to maintain intensity without signs/symptoms of physical distress.        Resistance Training   Training Prescription  Yes  Yes      Weight  orange bands  orange bands      Reps  10-15  10-15      Time  10 Minutes  10 Minutes        Treadmill   MPH  3.2  3.2      Grade  3  3      Minutes  17  17        Bike   Level  0.6  0.6      Minutes  17  17        NuStep   Level  5  5      Minutes  17  17      METs  2.1  2.5         Exercise Comments: Exercise Comments    Row Name 03/09/17 1233           Exercise Comments  Home exercise completed          Exercise Goals and Review: Exercise Goals    [place] Name 02/17/17 1042             Exercise Goals   Increase Physical Activity  Yes       Intervention  Provide  advice, education, support and counseling about physical activity/exercise needs.;Develop an individualized exercise prescription for aerobic and resistive training based on initial evaluation findings, risk stratification, comorbidities and participant's personal goals.       Expected Outcomes  Short Term: Attend rehab on a regular basis to increase amount of physical activity.       Increase Strength and Stamina  Yes       Intervention  Provide advice, education, support and counseling about physical activity/exercise needs.;Develop an individualized exercise prescription for aerobic and resistive training based on initial evaluation findings, risk stratification, comorbidities and participant's personal goals.       Expected Outcomes  Short Term: Increase workloads from initial exercise prescription for resistance, speed, and METs.       Able to understand and use rate of perceived exertion (RPE) scale  Yes       Intervention  Provide education and explanation on how to use RPE scale       Expected Outcomes  Short Term: Able to use RPE daily in rehab to express subjective intensity level;Long Term:  Able to use RPE to guide intensity level when exercising independently       Able to understand and use Dyspnea scale  Yes       Intervention  Provide  education and explanation on how to use Dyspnea scale       Expected Outcomes  Short Term: Able to use Dyspnea scale daily in rehab to express subjective sense of shortness of breath during exertion;Long Term: Able to use Dyspnea scale to guide intensity level when exercising independently       Knowledge and understanding of Target Heart Rate Range (THRR)  Yes       Intervention  Provide education and explanation of THRR including how the numbers were predicted and where they are located for reference       Expected Outcomes  Short Term: Able to state/look up THRR       Understanding of Exercise Prescription  Yes       Intervention  Provide education,  explanation, and written materials on patient's individual exercise prescription       Expected Outcomes  Short Term: Able to explain program exercise prescription;Long Term: Able to explain home exercise prescription to exercise independently          Exercise Goals Re-Evaluation : Exercise Goals Re-Evaluation    Row Name 03/20/17 1343 04/14/17 1124 05/15/17 1603         Exercise Goal Re-Evaluation   Exercise Goals Review  Increase Strength and Stamina;Able to understand and use Dyspnea scale;Increase Physical Activity;Able to understand and use rate of perceived exertion (RPE) scale;Knowledge and understanding of Target Heart Rate Range (THRR);Understanding of Exercise Prescription  Increase Strength and Stamina;Able to understand and use Dyspnea scale;Increase Physical Activity;Able to understand and use rate of perceived exertion (RPE) scale;Knowledge and understanding of Target Heart Rate Range (THRR);Understanding of Exercise Prescription  Increase Strength and Stamina;Able to understand and use Dyspnea scale;Increase Physical Activity;Able to understand and use rate of perceived exertion (RPE) scale;Knowledge and understanding of Target Heart Rate Range (THRR);Understanding of Exercise Prescription     Comments  Patient is progressing well in rehab. She states that she is already feeling better. Patient's highest avg MET level is 2.6. Is open to workload changes. Exercising at her local [employer] with her husband. Will cont. to monitor and progress as able.   Patient is progressing well in rehab. She states that she is already feeling better. Patient's highest avg MET level is 2.6. Is open to workload changes. Exercising at her local [employer] with her husband. Will cont. to monitor and progress as able.   Patient is progressing well in rehab. Has been feeling more fatigue lately--has gone to the Dr. regarding this issue. Patient's highest avg MET level is 2.6. Is open to workload changes. Exercising at  her local [employer] with her husband. Will cont. to monitor and progress as able.      Expected Outcomes  Through exercise at rehab and at home, patient will increase endurance and strength. Patient will also be able to perform ADL's with less shortness of breath and fatigue.  Through exercise at rehab and at home, patient will increase endurance and strength. Patient will also be able to perform ADL's with less shortness of breath and fatigue.  Through exercise at rehab and at home, patient will increase endurance and strength. Patient will also be able to perform ADL's with less shortness of breath and fatigue.        Discharge Exercise Prescription (Final Exercise Prescription Changes): Exercise Prescription Changes - 05/09/17 1200      Response to Exercise   Blood Pressure (Admit)  106/64    Blood Pressure (Exercise)  124/64    Blood Pressure (Exit)  100/60    Heart Rate (Admit)  99 bpm    Heart Rate (Exercise)  115 bpm    Heart Rate (Exit)  86 bpm    Oxygen Saturation (Admit)  100 %    Oxygen Saturation (Exercise)  95 %    Oxygen Saturation (Exit)  99 %    Rating of Perceived Exertion (Exercise)  13    Perceived Dyspnea (Exercise)  2    Duration  Continue with 45 min of aerobic exercise without signs/symptoms of physical distress.      Progression   Progression  Continue to progress workloads to maintain intensity without signs/symptoms of physical distress.      Resistance Training   Training Prescription  Yes    Weight  orange bands    Reps  10-15    Time  10 Minutes      Treadmill   MPH  3.2    Grade  3    Minutes  17      Bike   Level  0.6    Minutes  17      NuStep   Level  5    Minutes  17    METs  2.5       Nutrition:  Target Goals: Understanding of nutrition guidelines, daily intake of sodium <1565m, cholesterol <20108m calories 30% from fat and 7% or less from saturated fats, daily to have 5 or more servings of fruits and vegetables.  Biometrics: Pre  Biometrics - 02/17/17 1047      Pre Biometrics   Grip Strength  25 kg        Nutrition Therapy Plan and Nutrition Goals: Nutrition Therapy & Goals - 03/03/17 1357      Nutrition Therapy   Diet  General, healthful      Personal Nutrition Goals   Nutrition Goal  Identify food quantities necessary to achieve wt loss of  -2# per week to a goal wt loss of 2.7-10.9 kg (6-24 lb) at graduation from pulmonary rehab.      Intervention Plan   Intervention  Prescribe, educate and counsel regarding individualized specific dietary modifications aiming towards targeted core components such as weight, hypertension, lipid management, diabetes, heart failure and other comorbidities.    Expected Outcomes  Short Term Goal: Understand basic principles of dietary content, such as calories, fat, sodium, cholesterol and nutrients.;Long Term Goal: Adherence to prescribed nutrition plan.       Nutrition Assessments: Nutrition Assessments - 03/03/17 1356      Rate Your Plate Scores   Pre Score  -- survey returned incomplete       Nutrition Goals Re-Evaluation:   Nutrition Goals Discharge (Final Nutrition Goals Re-Evaluation):   Psychosocial: Target Goals: Acknowledge presence or absence of significant depression and/or stress, maximize coping skills, provide positive support system. Participant is able to verbalize types and ability to use techniques and skills needed for reducing stress and depression.  Initial Review & Psychosocial Screening: Initial Psych Review & Screening - 02/17/17 1102      Initial Review   Current issues with  Current Stress Concerns;Current Sleep Concerns    Source of Stress Concerns  Chronic Illness;Unable to participate in former interests or hobbies;Unable to perform yard/household activities    Comments  patient states sleep problems my be a [place]    Comments  [place] friends in [place]  Ex husband still friend. New husband of 3 years supportive and daughter at college      Barriers   Psychosocial barriers to participate in program  There are no identifiable barriers or psychosocial needs.      Screening Interventions   Interventions  Encouraged to exercise       Quality of Life Scores:  Scores of 19 and below usually indicate a poorer quality of life in these areas.  A difference of  2-3 points is a clinically meaningful difference.  A difference of 2-3 points in the total score of the Quality of Life Index has been associated with significant improvement in overall quality of life, self-image, physical symptoms, and general health in studies assessing change in quality of life.   PHQ-9: Recent Review Flowsheet Data    Depression screen [facility] 2/9 02/17/2017 12/29/2016 08/23/2016 04/21/2016 04/01/2016   Decreased Interest 0 1 - 3 3   Down, Depressed, Hopeless 0 _0 0   PHQ - 2 Score 0 _1 Altered sleeping - 0 0 0 3   Tired, decreased energy - _2 Change in appetite - 3 0 3 3   Feeling bad or failure about yourself  - 0 0 0 0   Trouble concentrating - 2 0 0 2   Moving slowly or fidgety/restless - - 0 0 0   Suicidal thoughts - 0 0 0 0   PHQ-9 Score - _3 Interpretation of Total Score  Total Score Depression Severity:  1-4 = Minimal depression, 5-9 = Mild depression, 10-14 = Moderate depression, 15-19 = Moderately severe depression, 20-27 = Severe depression   Psychosocial Evaluation and Intervention: Psychosocial Evaluation - 02/17/17 1104      Psychosocial Evaluation & Interventions   Interventions  Encouraged to exercise with the program and follow exercise prescription    Expected Outcomes  patient will remain free from psychosocial barriers to participation       Psychosocial Re-Evaluation: Psychosocial Re-Evaluation    Row Name 03/20/17 0857 04/17/17 1211 05/15/17 1455         Psychosocial Re-Evaluation   Current issues  with  None Identified  Current Stress Concerns  Current Stress Concerns     Comments  -  current stess is related to her fathers illness and her lack of information from the family about his condition   the stress of her father has resolved however after talking with patient it seems she is carring stress about her lack of energy     Expected Outcomes  patient will remain free from psychosocial barriers to participation in pulmonary rehab  patient will remain free from psychosocial barriers to participation in pulmonary rehab  patient will remain free from psychosocial barriers to participation in pulmonary rehab     Interventions  Encouraged to attend Pulmonary Rehabilitation for the exercise  Encouraged to attend Pulmonary Rehabilitation for the exercise emotional support and will refer to conseling at patients request  Encouraged to attend Pulmonary Rehabilitation for the exercise     Continue Psychosocial Services   No Follow up required  -  Follow up required by staff       Initial Review   Source of Stress Concerns  -  -  Chronic Illness;Unable to participate in former interests or hobbies;Unable to perform yard/household activities        Psychosocial Discharge (Final Psychosocial Re-Evaluation): Psychosocial Re-Evaluation - 05/15/17  1455      Psychosocial Re-Evaluation   Current issues with  Current Stress Concerns    Comments  the stress of her father has resolved however after talking with patient it seems she is carring stress about her lack of energy    Expected Outcomes  patient will remain free from psychosocial barriers to participation in pulmonary rehab    Interventions  Encouraged to attend Pulmonary Rehabilitation for the exercise    Continue Psychosocial Services   Follow up required by staff      Initial Review   Source of Stress Concerns  Chronic Illness;Unable to participate in former interests or hobbies;Unable to perform yard/household activities        Education: Education Goals: Education classes will be provided on a weekly basis, covering required topics. Participant will state understanding/return demonstration of topics presented.  Learning Barriers/Preferences: Learning Barriers/Preferences - 02/17/17 1101      Learning Barriers/Preferences   Learning Barriers  Language    Learning Preferences  Individual Instruction;Verbal Instruction       Education Topics: Risk Factor Reduction:  -Group instruction that is supported by a PowerPoint presentation. Instructor discusses the definition of a risk factor, different risk factors for pulmonary disease, and how the heart and lungs work together.     Nutrition for Pulmonary Patient:  -Group instruction provided by PowerPoint slides, verbal discussion, and written materials to support subject matter. The instructor gives an explanation and review of healthy diet recommendations, which includes a discussion on weight management, recommendations for fruit and vegetable consumption, as well as protein, fluid, caffeine, fiber, sodium, sugar, and alcohol. Tips for eating when patients are short of breath are discussed.   PULMONARY REHAB OTHER RESPIRATORY from 05/11/2017 in [place]  Date  03/30/17  Educator  Parke Simmers  Instruction Review Code  2- Demonstrated Understanding      Pursed Lip Breathing:  -Group instruction that is supported by demonstration and informational handouts. Instructor discusses the benefits of pursed lip and diaphragmatic breathing and detailed demonstration on how to preform both.     Oxygen Safety:  -Group instruction provided by PowerPoint, verbal discussion, and written material to support subject matter. There is an overview of "What is Oxygen" and "Why do we need it".  Instructor also reviews how to create a safe environment for oxygen use, the importance of using oxygen as prescribed, and the risks of noncompliance. There is a  brief discussion on traveling with oxygen and resources the patient may utilize.   PULMONARY REHAB OTHER RESPIRATORY from 05/11/2017 in [place]  Date  05/11/17  Educator  [place]  Instruction Review Code  2- Demonstrated Understanding      Oxygen Equipment:  -Group instruction provided by [facility] Staff utilizing handouts, written materials, and equipment demonstrations.   Signs and Symptoms:  -Group instruction provided by written material and verbal discussion to support subject matter. Warning signs and symptoms of infection, stroke, and heart attack are reviewed and when to call the physician/911 reinforced. Tips for preventing the spread of infection discussed.   PULMONARY REHAB OTHER RESPIRATORY from 05/11/2017 in [place]  Date  03/09/17  Educator  RN  Instruction Review Code  1- Verbalizes Understanding      Advanced Directives:  -Group instruction provided by verbal instruction and written material to support subject matter. Instructor reviews Advanced Directive laws and proper instruction for filling out document.   Pulmonary Video:  -  Group video education that reviews the importance of medication and oxygen compliance, exercise, good nutrition, pulmonary hygiene, and pursed lip and diaphragmatic breathing for the pulmonary patient.   Exercise for the Pulmonary Patient:  -Group instruction that is supported by a PowerPoint presentation. Instructor discusses benefits of exercise, core components of exercise, frequency, duration, and intensity of an exercise routine, importance of utilizing pulse oximetry during exercise, safety while exercising, and options of places to exercise outside of rehab.     PULMONARY REHAB OTHER RESPIRATORY from 05/11/2017 in [place]  Date  03/02/17  Educator  Cloyde Reams  Instruction Review Code  2- Demonstrated Understanding      Pulmonary  Medications:  -Verbally interactive group education provided by instructor with focus on inhaled medications and proper administration.   Anatomy and Physiology of the Respiratory System and Intimacy:  -Group instruction provided by PowerPoint, verbal discussion, and written material to support subject matter. Instructor reviews respiratory cycle and anatomical components of the respiratory system and their functions. Instructor also reviews differences in obstructive and restrictive respiratory diseases with examples of each. Intimacy, Sex, and Sexuality differences are reviewed with a discussion on how relationships can change when diagnosed with pulmonary disease. Common sexual concerns are reviewed.   PULMONARY REHAB OTHER RESPIRATORY from 05/11/2017 in [place]  Date  03/23/17  Educator  rn  Instruction Review Code  2- Demonstrated Understanding      MD DAY -A group question and answer session with a medical doctor that allows participants to ask questions that relate to their pulmonary disease state.   PULMONARY REHAB OTHER RESPIRATORY from 05/11/2017 in [place]  Date  04/20/17  Educator  Dr. Nelda Marseille  Instruction Review Code  1- Verbalizes Understanding      OTHER EDUCATION -Group or individual verbal, written, or video instructions that support the educational goals of the pulmonary rehab program.   Holiday Eating Survival Tips:  -Group instruction provided by PowerPoint slides, verbal discussion, and written materials to support subject matter. The instructor gives patients tips, tricks, and techniques to help them not only survive but enjoy the holidays despite the onslaught of food that accompanies the holidays.   Knowledge Questionnaire Score:   Core Components/Risk Factors/Patient Goals at Admission: Personal Goals and Risk Factors at Admission - 02/17/17 1102      Core Components/Risk Factors/Patient  Goals on Admission   Improve shortness of breath with ADL's  Yes    Intervention  Provide education, individualized exercise plan and daily activity instruction to help decrease symptoms of SOB with activities of daily living.    Expected Outcomes  Short Term: Improve cardiorespiratory fitness to achieve a reduction of symptoms when performing ADLs;Long Term: Be able to perform more ADLs without symptoms or delay the onset of symptoms       Core Components/Risk Factors/Patient Goals Review:  Goals and Risk Factor Review    Row Name 03/17/17 1551 04/17/17 1206 05/15/17 1437         Core Components/Risk Factors/Patient Goals Review   Personal Goals Review  Improve shortness of breath with ADL's;Develop more efficient breathing techniques such as purse lipped breathing and diaphragmatic breathing and practicing self-pacing with activity.  Improve shortness of breath with ADL's;Develop more efficient breathing techniques such as purse lipped breathing and diaphragmatic breathing and practicing self-pacing with activity.  Improve shortness of breath with ADL's;Develop more efficient breathing techniques such as purse lipped breathing and diaphragmatic  breathing and practicing self-pacing with activity.     Review  patient is doing well in pulmonary rehab. she has attended 5 sessions since her admission. she states she is beginning to see an improvement in her shortness of breath during her exercise. she continues to be encouraged  to use plb during exertions.  patient continues to do very well in pulmonary rehab. she has made friends and has surpassed any language or social barriers. she states she has not felt well over the last several sessions. she is not exercising at home as instructed. she tolerates workload increases when exercising. she continues to need prompting to PLB although once prompted she is able to continue without re-reminding. she did not attend her last exercise session related to  personal stress. she states her father in [place] is very sick and the family is not giving her a lot of information about his health. the only thing she knows is that he is in the hospital "not able to talk and everyone is crying on the phone when I speak to them but they wont tell me what is wrong with dad". she was very tearful. she was given emotional support and reassurance. will continue to support patients physical and emotional needs. expect to see greater progression towards goals over the next 30 days.  patient was doing well in pulmonary rehab until her last several visits. she continues to complain of feeling "tired" and "having no energy". she recently had a workup with her PCP who found her vitamine D level was low and started her on a replacement. he also referred her back to a cardiologist. she has remained at the same workload for several weeks related to her fatigue. she states that she loves coming to rehab however she has not gone to her community gym recently. she remains extremely sedentary at home. she continues to be encouraged and supported physically and emotionally and appreciative of all the care she recieves. hopefully she will be able to overcome this lethargy and tolerate workload increases over the next 30 days in order to increase her stamina and strength and decrease her shortness of breath. will continue to monitor closely.     Expected Outcomes  see admission expected outcomes  see admission expected outcomes  see admission expected outcomes        Core Components/Risk Factors/Patient Goals at Discharge (Final Review):  Goals and Risk Factor Review - 05/15/17 1437      Core Components/Risk Factors/Patient Goals Review   Personal Goals Review  Improve shortness of breath with ADL's;Develop more efficient breathing techniques such as purse lipped breathing and diaphragmatic breathing and practicing self-pacing with activity.    Review  patient was doing well in pulmonary  rehab until her last several visits. she continues to complain of feeling "tired" and "having no energy". she recently had a workup with her PCP who found her vitamine D level was low and started her on a replacement. he also referred her back to a cardiologist. she has remained at the same workload for several weeks related to her fatigue. she states that she loves coming to rehab however she has not gone to her community gym recently. she remains extremely sedentary at home. she continues to be encouraged and supported physically and emotionally and appreciative of all the care she recieves. hopefully she will be able to overcome this lethargy and tolerate workload increases over the next 30 days in order to increase her stamina and strength and decrease  her shortness of breath. will continue to monitor closely.    Expected Outcomes  see admission expected outcomes       ITP Comments: ITP Comments    Row Name 05/15/17 1437           ITP Comments  Dr. Jennet Maduro, Medical Director          Comments: patient has attended 69 pulmonary rehab sessions since admission

## 2017-05-23 ENCOUNTER — Encounter (HOSPITAL_COMMUNITY)
Admission: RE | Admit: 2017-05-23 | Discharge: 2017-05-23 | Disposition: A | Discharge status: Home / Self Care | Payer: Medicare HMO | Source: Ambulatory Visit | Attending: Pulmonary Disease | Admitting: Pulmonary Disease

## 2017-05-23 VITALS — Wt 178.6 lb

## 2017-05-23 DIAGNOSIS — K219 Gastro-esophageal reflux disease without esophagitis: Secondary | ICD-10-CM | POA: Diagnosis not present

## 2017-05-23 DIAGNOSIS — J479 Bronchiectasis, uncomplicated: Secondary | ICD-10-CM

## 2017-05-23 DIAGNOSIS — G43909 Migraine, unspecified, not intractable, without status migrainosus: Secondary | ICD-10-CM | POA: Diagnosis not present

## 2017-05-23 DIAGNOSIS — J449 Chronic obstructive pulmonary disease, unspecified: Secondary | ICD-10-CM | POA: Diagnosis not present

## 2017-05-23 NOTE — Progress Notes (Signed)
Daily Session Note  Patient Details  Name: Glorimar Stroope MRN: [id number] Date of Birth: [birth year] Referring Provider:     Pulmonary Rehab Walk Test from 02/21/2017 in [place]  Referring Provider  Dr. Vaughan Browner      Encounter Date: 05/23/2017  Check In: Session Check In - 05/23/17 1216      Check-In   Location  [facility]    Staff Present  Cloyde Reams Milaya Hora, MS, ACSM RCEP, Exercise Physiologist;Lisa Colletta [place], RN, [place] physician immediately available to respond to emergencies  [facility] immediately available    Physician(s)  Dr. Loleta Books    Medication changes reported      No    Fall or balance concerns reported     No    Tobacco Cessation  No Change    Warm-up and Cool-down  Performed as group-led instruction    Resistance Training Performed  Yes    VAD Patient?  No      Pain Assessment   Currently in Pain?  No/denies    Multiple Pain Sites  No       Capillary Blood Glucose: No results found for this or any previous visit (from the past 24 hour(s)).  Exercise Prescription Changes - 05/23/17 1200      Response to Exercise   Blood Pressure (Admit)  98/70    Blood Pressure (Exercise)  110/70    Blood Pressure (Exit)  100/50    Heart Rate (Admit)  99 bpm    Heart Rate (Exercise)  116 bpm    Heart Rate (Exit)  97 bpm    Oxygen Saturation (Admit)  99 %    Oxygen Saturation (Exercise)  99 %    Oxygen Saturation (Exit)  98 %    Rating of Perceived Exertion (Exercise)  13    Perceived Dyspnea (Exercise)  2    Duration  Continue with 45 min of aerobic exercise without signs/symptoms of physical distress.    Intensity  THRR unchanged      Progression   Progression  Continue to progress workloads to maintain intensity without signs/symptoms of physical distress.      Resistance Training   Training Prescription  Yes    Weight  orange bands    Reps  10-15    Time  10 Minutes      Treadmill   MPH  3.2    Grade  3    Minutes  17      Bike   Minutes  --      Recumbant Bike   Level  3    Minutes  17      NuStep   Level  5    Minutes  17    METs  2.5       Social History   Tobacco Use  Smoking Status Never Smoker  Smokeless Tobacco Never Used    Goals Met:  Exercise tolerated well No report of cardiac concerns or symptoms Strength training completed today  Goals Unmet:  Not Applicable  Comments: Service time is from 10:30a to 12:00p    Dr. Rush Farmer is Medical Director for Pulmonary Rehab at [facility].

## 2017-05-25 ENCOUNTER — Encounter (HOSPITAL_COMMUNITY): Payer: Medicare HMO

## 2017-05-30 ENCOUNTER — Encounter (HOSPITAL_COMMUNITY)
Admission: RE | Admit: 2017-05-30 | Discharge: 2017-05-30 | Disposition: A | Discharge status: Home / Self Care | Payer: Medicare HMO | Source: Ambulatory Visit | Attending: Pulmonary Disease | Admitting: Pulmonary Disease

## 2017-05-30 DIAGNOSIS — J449 Chronic obstructive pulmonary disease, unspecified: Secondary | ICD-10-CM | POA: Diagnosis not present

## 2017-05-30 DIAGNOSIS — K219 Gastro-esophageal reflux disease without esophagitis: Secondary | ICD-10-CM | POA: Diagnosis not present

## 2017-05-30 DIAGNOSIS — G43909 Migraine, unspecified, not intractable, without status migrainosus: Secondary | ICD-10-CM | POA: Diagnosis not present

## 2017-05-30 DIAGNOSIS — J479 Bronchiectasis, uncomplicated: Secondary | ICD-10-CM | POA: Diagnosis not present

## 2017-05-30 NOTE — Progress Notes (Signed)
Daily Session Note  Patient Details  Name: Hannah Barron MRN: [id number] Date of Birth: 1970/09/14 Referring Provider:     Pulmonary Rehab Walk Test from 02/21/2017 in [place]  Referring Provider  Dr. Vaughan Browner      Encounter Date: 05/30/2017  Check In: Session Check In - 05/30/17 1204      Check-In   Location  [facility]    Staff Present  Cloyde Reams Tosha Belgarde, MS, ACSM RCEP, Exercise Physiologist;Lisa Ysidro Evert, RN;Carlette Wilber Oliphant, RN, BSN    Supervising physician immediately available to respond to emergencies  [facility] immediately available    Physician(s)  Dr. Sarajane Jews    Medication changes reported      No    Fall or balance concerns reported     No    Tobacco Cessation  No Change    Warm-up and Cool-down  Performed as group-led instruction    Resistance Training Performed  Yes    VAD Patient?  No      Pain Assessment   Currently in Pain?  No/denies       Capillary Blood Glucose: No results found for this or any previous visit (from the past 24 hour(s)).    Social History   Tobacco Use  Smoking Status Never Smoker  Smokeless Tobacco Never Used    Goals Met:  Exercise tolerated well No report of cardiac concerns or symptoms Strength training completed today  Goals Unmet:  Not Applicable  Comments: Service time is from 10:30a to 12:05p    Dr. Rush Farmer is Medical Director for Pulmonary Rehab at [facility]

## 2017-06-01 ENCOUNTER — Encounter (HOSPITAL_COMMUNITY)
Admission: RE | Admit: 2017-06-01 | Discharge: 2017-06-01 | Disposition: A | Discharge status: Home / Self Care | Payer: Medicare HMO | Source: Ambulatory Visit | Attending: Pulmonary Disease | Admitting: Pulmonary Disease

## 2017-06-01 DIAGNOSIS — G43909 Migraine, unspecified, not intractable, without status migrainosus: Secondary | ICD-10-CM | POA: Diagnosis not present

## 2017-06-01 DIAGNOSIS — J479 Bronchiectasis, uncomplicated: Secondary | ICD-10-CM | POA: Diagnosis not present

## 2017-06-01 DIAGNOSIS — J449 Chronic obstructive pulmonary disease, unspecified: Secondary | ICD-10-CM | POA: Diagnosis not present

## 2017-06-01 DIAGNOSIS — K219 Gastro-esophageal reflux disease without esophagitis: Secondary | ICD-10-CM | POA: Diagnosis not present

## 2017-06-01 NOTE — Progress Notes (Signed)
Daily Session Note  Patient Details  Name: Hannah Barron MRN: [id number] Date of Birth: 06-10-1970 Referring Provider:     Pulmonary Rehab Walk Test from 02/21/2017 in [place]  Referring Provider  Hannah Barron      Encounter Date: 06/01/2017  Check In: Session Check In - 06/01/17 1212      Check-In   Location  [facility]    Staff Present  Hannah Reams DiVincenzo, MS, ACSM RCEP, Exercise Physiologist;Hannah Cobern Ysidro Evert, RN    Supervising physician immediately available to respond to emergencies  [facility] immediately available    Physician(s)  Hannah Barron    Medication changes reported      No    Fall or balance concerns reported     No    Tobacco Cessation  No Change    Warm-up and Cool-down  Performed as group-led instruction    Resistance Training Performed  Yes    VAD Patient?  No      Pain Assessment   Currently in Pain?  No/denies    Multiple Pain Sites  No       Capillary Blood Glucose: No results found for this or any previous visit (from the past 24 hour(s)).    Social History   Tobacco Use  Smoking Status Never Smoker  Smokeless Tobacco Never Used    Goals Met:  Strength training completed today  Goals Unmet:  Not Applicable  Comments: Service time is from 1030 to 1200.Hannah Barron was tearful today and c/o of feeling emotionally upset. She c/o of her chest and around the sides of her chest being tight on the treadmill. She used her rescue inhaler and her flutter value. We discussed with her if she felt she needs someone to talk with. Hannah Barron admits to holding a lot inside and doesn't talk with her family about her medical problems. She has an appointment with her PCP next week. She is going to discuss this with them. She left in stable condition denies any SOB or chest tightness. It was relieved after using her inhaler and flutter value.   Hannah Barron is Medical Director for Pulmonary Rehab at [facility].

## 2017-06-02 ENCOUNTER — Ambulatory Visit: Payer: Medicare HMO | Admitting: Gastroenterology

## 2017-06-02 ENCOUNTER — Encounter: Payer: Self-pay | Admitting: Gastroenterology

## 2017-06-02 VITALS — BP 100/60 | HR 92 | Ht 67.0 in | Wt 179.1 lb

## 2017-06-02 DIAGNOSIS — R6881 Early satiety: Secondary | ICD-10-CM

## 2017-06-02 DIAGNOSIS — R140 Abdominal distension (gaseous): Secondary | ICD-10-CM | POA: Diagnosis not present

## 2017-06-02 DIAGNOSIS — R1013 Epigastric pain: Secondary | ICD-10-CM

## 2017-06-02 DIAGNOSIS — G8929 Other chronic pain: Secondary | ICD-10-CM | POA: Diagnosis not present

## 2017-06-02 MED ORDER — DICYCLOMINE HCL 20 MG PO TABS: 20.0000 mg | ORAL_TABLET | Freq: Every day | ORAL | 2 refills | Status: DC | PRN

## 2017-06-02 NOTE — Patient Instructions (Signed)
If you are age 47 or older, your body mass index should be between 23-30. Your Body mass index is 28.05 kg/m. If this is out of the aforementioned range listed, please consider follow up with your Primary Care Provider.  If you are age 45 or younger, your body mass index should be between 19-25. Your Body mass index is 28.05 kg/m. If this is out of the aformentioned range listed, please consider follow up with your Primary Care Provider.   You have been scheduled for a CT scan of the abdomen and pelvis at [facility] ([address]---this is in the same building as Press [facility]).   You are scheduled on 06-15-2017 at 230pm. You should arrive 15 minutes prior to your appointment time for registration. Please follow the written instructions below on the day of your exam:  WARNING: IF YOU ARE ALLERGIC TO IODINE/X-RAY DYE, PLEASE NOTIFY RADIOLOGY IMMEDIATELY AT [phone]! YOU WILL BE GIVEN A 13 HOUR PREMEDICATION PREP.  1) Do not eat or drink anything after 1030am (4 hours prior to your test) 2) You have been given 2 bottles of oral contrast to drink. The solution may taste better if refrigerated, but do NOT add ice or any other liquid to this solution. Shake well before drinking.    Drink 1 bottle of contrast @ 1230pm (2 hours prior to your exam)  Drink 1 bottle of contrast @ 130pm (1 hour prior to your exam)  You may take any medications as prescribed with a small amount of water except for the following: Metformin, Glucophage, Glucovance, Avandamet, Riomet, Fortamet, Actoplus Met, Janumet, Glumetza or Metaglip. The above medications must be held the day of the exam AND 48 hours after the exam.  The purpose of you drinking the oral contrast is to aid in the visualization of your intestinal tract. The contrast solution may cause some diarrhea. Before your exam is started, you will be given a small amount of fluid to drink. Depending on your individual set of symptoms, you may  also receive an intravenous injection of x-ray contrast/dye. Plan on being at [facility] for 30 minutes or longer, depending on the type of exam you are having performed.  This test typically takes 30-45 minutes to complete.  If you have any questions regarding your exam or if you need to reschedule, you may call the CT department at [phone] between the hours of 8:00 am and 5:00 pm, Monday-Friday.  It was a pleasure to see you today!  Dr. Loletha Carrow   ________________________________________________________________________

## 2017-06-02 NOTE — Progress Notes (Signed)
[facility] Progress Note  Chief Complaint: Nonulcer dyspepsia  Subjective  History:  This is a pleasant 47 year old woman I have seen a couple times this year.  She has had years of epigastric and right upper quadrant pain sometimes related to meals, other times with position.  She previously had significant improvement on dicyclomine when she was worked up in [place].  This medicine no longer seems to work for her.  Upper endoscopy was unrevealing, and a follow-up in March I gave her a trial of buspirone.  She started at 7.5 mg at night, then went up to 15 mg nightly.  She telephoned [place] in mid April saying the medicine was working well and she wished to continue it.  She now states that the buspirone no longer as effective as it was before.  She is very concerned about and bothered by postprandial abdominal bloating, particularly the upper abdomen.  She has back pain as well and thinks it may be related.  She denies nausea, vomiting, anorexia or weight loss.Angy had some of her dicyclomine left from before, and decided to take it in the last couple of weeks and found it more effective than it had been previously.  She therefore asked for a refill of that.  She denies pelvic pain or vaginal bleeding.  She had a hysterectomy for fibroids, and follows with gynecology regularly.  Her last pelvic ultrasound in December 2018 showed normal ovaries.  She was somewhat tearful and conveyed concern over her symptoms and frustration that therapy so far have not been very effective.  She feels she has no one to talk to about the symptoms and does not want to worry her elderly parents are still in her home country.  ROS: Cardiovascular:  no chest pain Respiratory: Occasional cough and wheezing for which she takes albuterol She denies dysuria or hematuria Intermittent sadness or anxiety that she does not feel impair her with a function. Denies abuse  Remainder of systems negative except as  above  The patient's Past Medical, Family and Social History were reviewed and are on file in the EMR.  Objective:  Med list reviewed  Current Outpatient Medications:  .  albuterol (VENTOLIN HFA) 108 (90 Base) MCG/ACT inhaler, Inhale 2 puffs into the lungs every 6 (six) hours as needed for wheezing or shortness of breath., Disp: 3 Inhaler, Rfl: 3 .  busPIRone (BUSPAR) 15 MG tablet, Take 1 tablet (15 mg total) by mouth at bedtime., Disp: 30 tablet, Rfl: 1 .  dicyclomine (BENTYL) 20 MG tablet, Take 1 tablet (20 mg total) by mouth daily as needed for spasms., Disp: 30 tablet, Rfl: 2 .  estradiol (ESTRACE) 0.5 MG tablet, Take 1 tablet (0.5 mg total) by mouth daily., Disp: 30 tablet, Rfl: 6 .  ibuprofen (ADVIL,MOTRIN) 400 MG tablet, Take 1 tablet (400 mg total) by mouth every 6 (six) hours as needed., Disp: 90 tablet, Rfl: 0 .  metoprolol succinate (TOPROL-XL) 50 MG 24 hr tablet, Take 1 tablet (50mg ) in morning and 1/2 tablet (25mg ) in evening with or immediately following a meal. (Patient taking differently: Take 25-50 mg by mouth See admin instructions. 50 mg in the morning and 25 mg in the evening), Disp: 135 tablet, Rfl: 1 .  omeprazole (PRILOSEC) 40 MG capsule, Take 1 capsule (40 mg total) by mouth daily., Disp: 90 capsule, Rfl: 0 .  Respiratory Therapy Supplies (FLUTTER) DEVI, Use as directed., Disp: 1 each, Rfl: 0 .  RESTASIS 0.05 % ophthalmic  emulsion, Instill 1 drop into both eyes two times a day, Disp: , Rfl: 2 .  SYMBICORT 160-4.5 MCG/ACT inhaler, TAKE 2 PUFFS BY MOUTH TWICE A DAY, Disp: 10.2 Inhaler, Rfl: 3  Current Facility-Administered Medications:  .  0.9 %  sodium chloride infusion, 500 mL, Intravenous, Once, Danis, Estill Cotta III, MD   Vital signs in last 24 hrs: Vitals:   06/02/17 1044  BP: 100/60  Pulse: 92    Physical Exam  She is well-appearing with saddened affect  HEENT: sclera anicteric, oral mucosa moist without lesions  Neck: supple, no thyromegaly, JVD or  lymphadenopathy  Cardiac: RRR without murmurs, S1S2 heard, no peripheral edema  Pulm: clear to auscultation bilaterally, normal RR and effort noted  Abdomen: soft, protuberant, no tenderness, with active bowel sounds. No guarding or palpable hepatosplenomegaly.  Skin; warm and dry, no jaundice or rash  Radiologic studies:  Pelvic US 12/2016 s/p hysterectomy, ovaries normal  @ASSESSMENTPLANBEGIN @ Assessment: Encounter Diagnoses  Name Primary?  . Abdominal pain, chronic, epigastric Yes  . Early satiety   . Abdominal bloating   . 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  There is occasional constipation, no diarrhea or weight loss to suggest malabsorption such as bacterial overgrowth.   Plan:  CT abdomen and pelvis to be certain there is no ascites, obstruction or neoplasia. I have refilled the dicyclomine and advised her to stop the buspirone since it is no longer helpful. If CT is unrevealing, I may give her a trial of rifaximin if affordable. In that case, consultation with primary care over her mood issues would also be helpful.  Total time 25 minutes, over half spent face-to-face with patient in counseling and coordination of care.   Nelida Meuse III

## 2017-06-06 ENCOUNTER — Encounter (HOSPITAL_COMMUNITY)
Admission: RE | Admit: 2017-06-06 | Discharge: 2017-06-06 | Disposition: A | Discharge status: Home / Self Care | Payer: Medicare HMO | Source: Ambulatory Visit | Attending: Pulmonary Disease | Admitting: Pulmonary Disease

## 2017-06-06 DIAGNOSIS — J479 Bronchiectasis, uncomplicated: Secondary | ICD-10-CM

## 2017-06-06 DIAGNOSIS — J449 Chronic obstructive pulmonary disease, unspecified: Secondary | ICD-10-CM | POA: Diagnosis not present

## 2017-06-06 DIAGNOSIS — K219 Gastro-esophageal reflux disease without esophagitis: Secondary | ICD-10-CM | POA: Diagnosis not present

## 2017-06-06 DIAGNOSIS — G43909 Migraine, unspecified, not intractable, without status migrainosus: Secondary | ICD-10-CM | POA: Diagnosis not present

## 2017-06-07 ENCOUNTER — Encounter: Payer: Self-pay | Admitting: Pulmonary Disease

## 2017-06-07 ENCOUNTER — Ambulatory Visit: Payer: Medicare HMO | Admitting: Pulmonary Disease

## 2017-06-07 VITALS — BP 110/68 | HR 97 | Ht 67.0 in | Wt 178.0 lb

## 2017-06-07 DIAGNOSIS — J449 Chronic obstructive pulmonary disease, unspecified: Secondary | ICD-10-CM

## 2017-06-07 NOTE — Patient Instructions (Signed)
I am glad that your breathing is stable.  Continue your Symbicort. We will get pulmonary function tests before your next visit Follow-up in clinic in 6 months.

## 2017-06-07 NOTE — Progress Notes (Signed)
Hannah Barron    [id number]    June 28, 1970  Primary Care Physician:Shambley, Delphia Grates, NP  Referring Physician: Lance Sell, NP [place],  [phone]  Chief complaint:  Follow up for  Cystic bronchiectasis  HPI: 47 year old with chronic obstructive lung disease, bronchiectasis. She was previously followed at [place] with a VATS biopsy in 2003 and was told she had cystic bronchiectasis. She had followed up with a pulmonologist at [facility] but has moved to [place] on 2017. We have been unable to get records from [place] in spite of multiple attempts.  She is an immigrant from [place] in 1998 and does not report any exposure to tuberculosis. She does not recall getting a BCG vaccine as a child. Underwent a bronchoscopy to evaluate for MAI infection and a positive quantiferon as she was unable to give a good sputum sample. All results are negative except for positive PJP on DFA. She underwent further evaluation with negative HIV and beta glucan tests. She had a hysterectomy for uterine fibroids  On 3/27  Interim history: Treated in December 2018 for bronchiectasis exacerbation with Levaquin. Since then she has had stable symptoms.  Chronic cough with white-yellow mucus, chronic dyspnea on exertion She is continuing the Symbicort, flutter wall Just finished pulmonary rehab.  Outpatient Encounter Medications as of 06/07/2017  Medication Sig  . albuterol (VENTOLIN HFA) 108 (90 Base) MCG/ACT inhaler Inhale 2 puffs into the lungs every 6 (six) hours as needed for wheezing or shortness of breath.  . busPIRone (BUSPAR) 15 MG tablet Take 1 tablet (15 mg total) by mouth at bedtime.  . dicyclomine (BENTYL) 20 MG tablet Take 1 tablet (20 mg total) by mouth daily as needed for spasms.  Marland Kitchen estradiol (ESTRACE) 0.5 MG tablet Take 1 tablet (0.5 mg total) by mouth daily.  Marland Kitchen ibuprofen (ADVIL,MOTRIN) 400 MG tablet Take 1 tablet (400 mg  total) by mouth every 6 (six) hours as needed.  . metoprolol succinate (TOPROL-XL) 50 MG 24 hr tablet Take 1 tablet (50mg ) in morning and 1/2 tablet (25mg ) in evening with or immediately following a meal. (Patient taking differently: Take 25-50 mg by mouth See admin instructions. 50 mg in the morning and 25 mg in the evening)  . omeprazole (PRILOSEC) 40 MG capsule Take 1 capsule (40 mg total) by mouth daily.  Marland Kitchen Respiratory Therapy Supplies (FLUTTER) DEVI Use as directed.  . RESTASIS 0.05 % ophthalmic emulsion Instill 1 drop into both eyes two times a day  . SYMBICORT 160-4.5 MCG/ACT inhaler TAKE 2 PUFFS BY MOUTH TWICE A DAY   Facility-Administered Encounter Medications as of 06/07/2017  Medication  . 0.9 %  sodium chloride infusion    Allergies as of 06/07/2017  . (No Known Allergies)    Past Medical History:  Diagnosis Date  . Anemia   . Bronchiectasis ([place])   . COPD (chronic obstructive pulmonary disease) ([place])   . GERD (gastroesophageal reflux disease)   . Migraines   . Paroxysmal SVT (supraventricular tachycardia) ([place])    a. diagnosed in 11/2015.  . Right leg numbness     Past Surgical History:  Procedure Laterality Date  . CESAREAN SECTION    . CYSTOSCOPY N/A 04/05/2016   Procedure: CYSTOSCOPY;  Surgeon: Lavonia Drafts, MD;  Location: [facility];  Service: Gynecology;  Laterality: N/A;  . LAPAROSCOPIC VAGINAL HYSTERECTOMY WITH SALPINGECTOMY Bilateral 04/05/2016   Procedure: LAPAROSCOPIC ASSISTED VAGINAL HYSTERECTOMY WITH SALPINGECTOMY;  Surgeon: Lavonia Drafts, MD;  Location: [facility];  Service: Gynecology;  Laterality: Bilateral;  . LUNG BIOPSY    . VIDEO BRONCHOSCOPY Bilateral 03/29/2016   Procedure: VIDEO BRONCHOSCOPY WITHOUT FLUORO;  Surgeon: Marshell Garfinkel, MD;  Location: [facility];  Service: Cardiopulmonary;  Laterality: Bilateral;    Family History  Problem Relation Age of Onset  . Healthy Mother   . Healthy Father     Social History    Socioeconomic History  . Marital status: Married    Spouse name: Not on file  . Number of children: 1  . Years of education: 37  . Highest education level: Not on file  Occupational History  . Occupation: disability  Social Needs  . Financial resource strain: Not on file  . Food insecurity:    Worry: Not on file    Inability: Not on file  . Transportation needs:    Medical: Not on file    Non-medical: Not on file  Tobacco Use  . Smoking status: Never Smoker  . Smokeless tobacco: Never Used  Substance and Sexual Activity  . Alcohol use: No    Alcohol/week: 0.0 oz  . Drug use: No  . Sexual activity: Yes  Lifestyle  . Physical activity:    Days per week: Not on file    Minutes per session: Not on file  . Stress: Not on file  Relationships  . Social connections:    Talks on phone: Not on file    Gets together: Not on file    Attends religious service: Not on file    Active member of club or organization: Not on file    Attends meetings of clubs or organizations: Not on file    Relationship status: Not on file  . Intimate partner violence:    Fear of current or ex partner: Not on file    Emotionally abused: Not on file    Physically abused: Not on file    Forced sexual activity: Not on file  Other Topics Concern  . Not on file  Social History Narrative   Fun: Watch TV   Review of systems: Review of Systems  Constitutional: Negative for fever and chills.  HENT: Negative.   Eyes: Negative for blurred vision.  Respiratory: as per HPI  Cardiovascular: Negative for chest pain and palpitations.  Gastrointestinal: Negative for vomiting, diarrhea, blood per rectum. Genitourinary: Negative for dysuria, urgency, frequency and hematuria.  Musculoskeletal: Negative for myalgias, back pain and joint pain.  Skin: Negative for itching and rash.  Neurological: Negative for dizziness, tremors, focal weakness, seizures and loss of consciousness.  Endo/Heme/Allergies: Negative  for environmental allergies.  Psychiatric/Behavioral: Negative for depression, suicidal ideas and hallucinations.  All other systems reviewed and are negative.  Physical Exam: Blood pressure 110/68, pulse 97, height 5\' 7"  (1.702 m), weight 178 lb (80.7 kg), last menstrual period 03/31/2016, SpO2 98 %. Gen:      No acute distress HEENT:  EOMI, sclera anicteric Neck:     No masses; no thyromegaly Lungs:    Clear to auscultation bilaterally; normal respiratory effort CV:         Regular rate and rhythm; no murmurs Abd:      + bowel sounds; soft, non-tender; no palpable masses, no distension Ext:    No edema; adequate peripheral perfusion Skin:      Warm and dry; no rash Neuro: alert and oriented x 3 Psych: normal mood and affect  Data Reviewed: A1AT 03/16/16- 161, PIMM Quantiferon 03/16/16-  Positive CF panel 03/25/15- negative for 97 mutations analyzed (report scanned)  Labs 03/24/16 IgG 1477 IgA 134 IgM 168 IgE 6 ANA, CCP, RA- negative  HIV 04/01/16- Negative Beta D glucan 04/01/16- Negative  CT high resolution 07/16/15-areas of cystic and varicose bronchiectasis, extensive air trapping, multiple pulmonary nodules. CT chest 03/21/16-areas of bronchiectasis, air trapping and pulmonary nodules. Unchanged compared to 2017 CT angiogram 12/23/16-stable areas of bronchiectasis, air trapping and pulmonary nodules.  No pulmonary embolism. I have reviewed all images personally.  Myocardial perfusion study 12/2015 >neg  Ischemia , EF 55% Echo 10/2015 >EF 65%.   PFT 06/2015  FVC 2.45 (70%) FEV1 1.54 [56%), F/F 63, TLC 78% Moderate obstructive airway disease, minimal restriction. DLCO could not be completed  03/16/16 FVC 2.29 (69%), FEV1 1.46 (54%), F/F 64   Bronchoscopy 03/29/16 Cultures, AFB, fungal cultures-negative to date PJP DFA-positive Cytology-no malignant cells, CD4: CD8 ratio-1.62 Cell count WBC-26, 26% lymphs, 50% neutrophils, 24% monocyte macrophage  Assessment:  Cystic  bronchiectasis She's had workup which is negative for alpha-1 antitrypsin, immunoglobulin deficiency, autoimmune disease, cystic fibrosis and mycobacterial infections.    Continues on Symbicort, albuterol as needed Continue mucociliary clearance with flutter valve, Mucinex Completed pulmonary rehab.  I encouraged her to stay active.  She has joined a Building services engineer. We will follow-up in 6 months with repeat pulmonary function test  Positive PJP DFA This is likely a false positive as there is no evidence of immunodeficiency, HIV is negative. Besides her CT scan is not characteristic of pneumocystis infection and beta D glucan is negative. We will continue to observe for now.  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 Discussed INH therapy but defer as per pt wishes  Plan/Recommendations: - Continue symbicort, albuterol - Flutter valve,  Mucinex - Six-month follow-up with PFTs.  Marshell Garfinkel MD  Pulmonary and Critical Care 06/07/2017, 10:18 AM  CC: Lance Sell, NP

## 2017-06-08 ENCOUNTER — Encounter (HOSPITAL_COMMUNITY): Payer: Medicare HMO

## 2017-06-13 ENCOUNTER — Encounter (HOSPITAL_COMMUNITY): Payer: Medicare HMO

## 2017-06-14 ENCOUNTER — Ambulatory Visit
Admission: RE | Admit: 2017-06-14 | Discharge: 2017-06-14 | Disposition: A | Discharge status: Home / Self Care | Payer: Medicare HMO | Source: Ambulatory Visit | Attending: Nurse Practitioner | Admitting: Nurse Practitioner

## 2017-06-14 DIAGNOSIS — Z1231 Encounter for screening mammogram for malignant neoplasm of breast: Secondary | ICD-10-CM | POA: Diagnosis not present

## 2017-06-14 IMAGING — MG DIGITAL SCREENING BILATERAL MAMMOGRAM WITH TOMO AND CAD
5 series · 6 of 13 positions shown · non-contrast
Comparison: Previous exam(s).

CLINICAL DATA: Screening.

EXAM:
DIGITAL SCREENING BILATERAL MAMMOGRAM WITH TOMO AND CAD

[L MLO synth-2D]
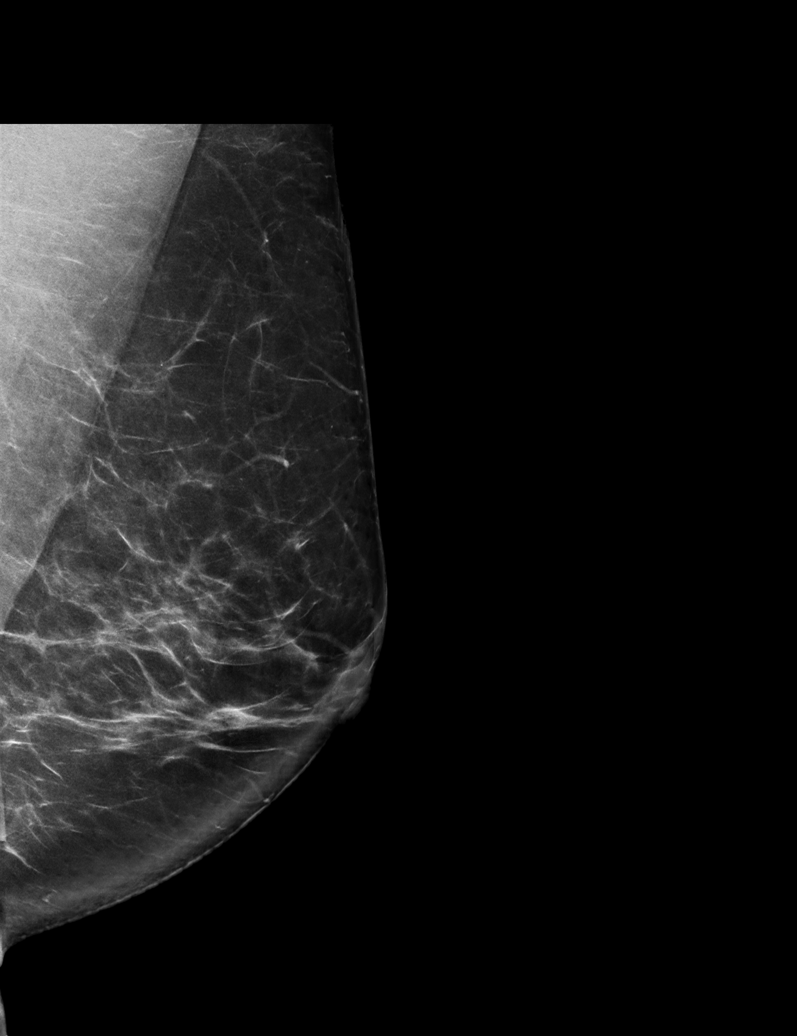

[L CC synth-2D]
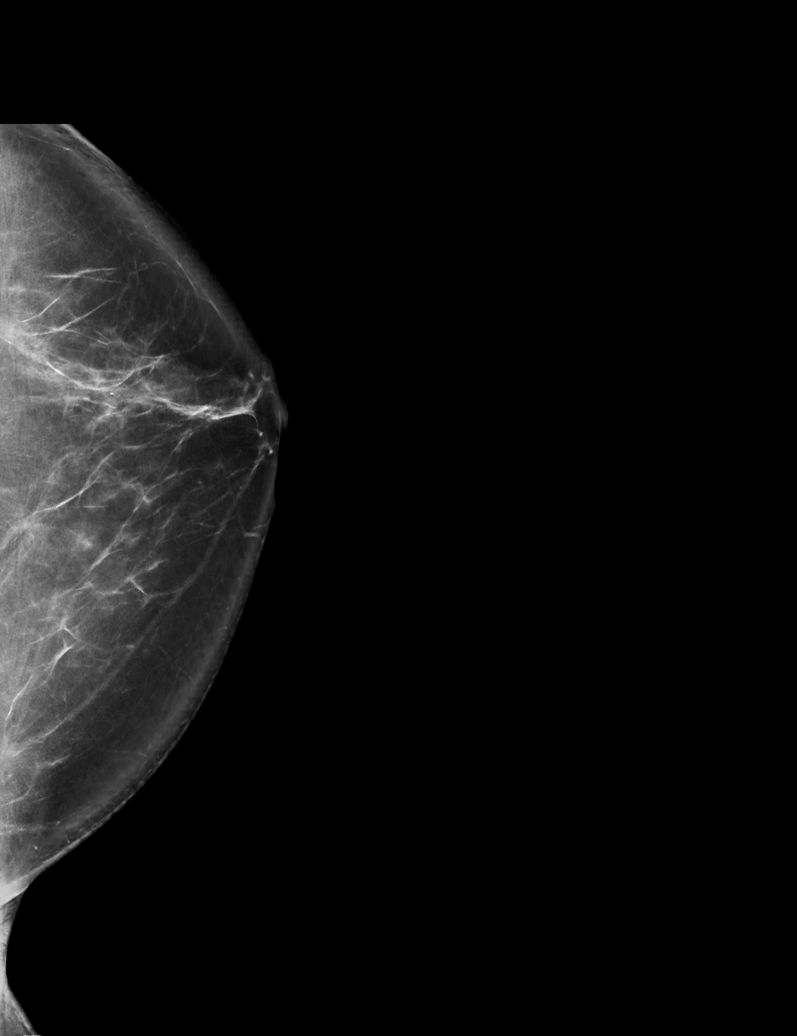

[R MLO synth-2D]
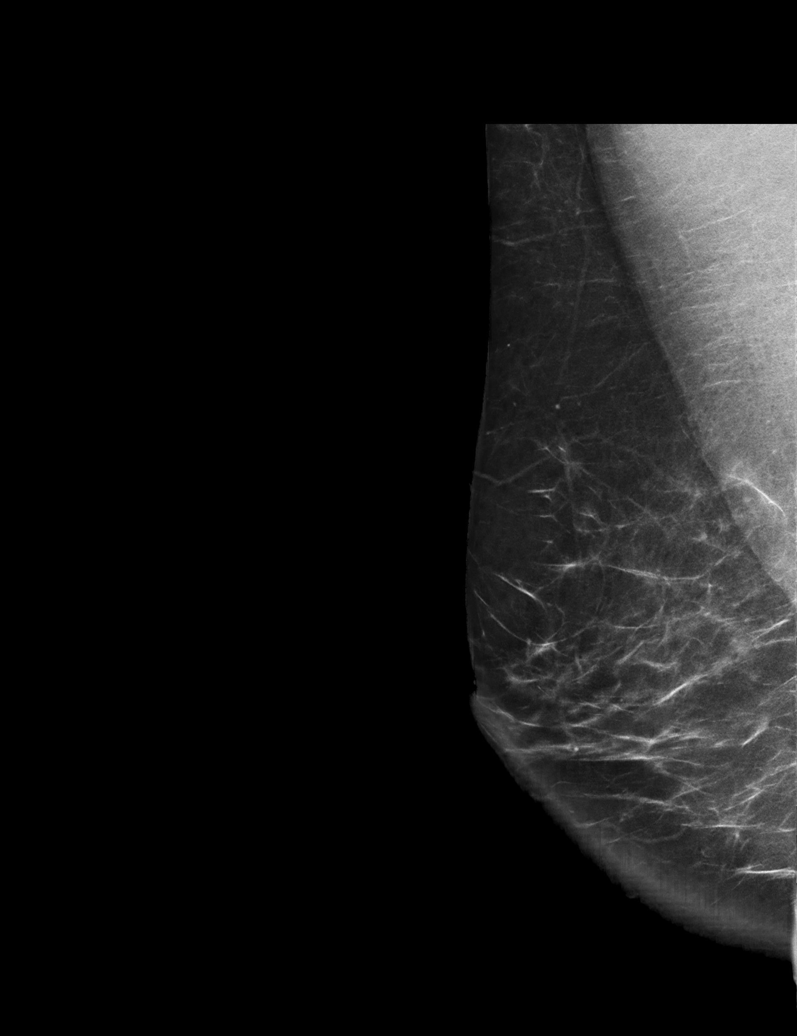

[L MLO tomo · 2 of 86 frames shown]
[frame 28/86]
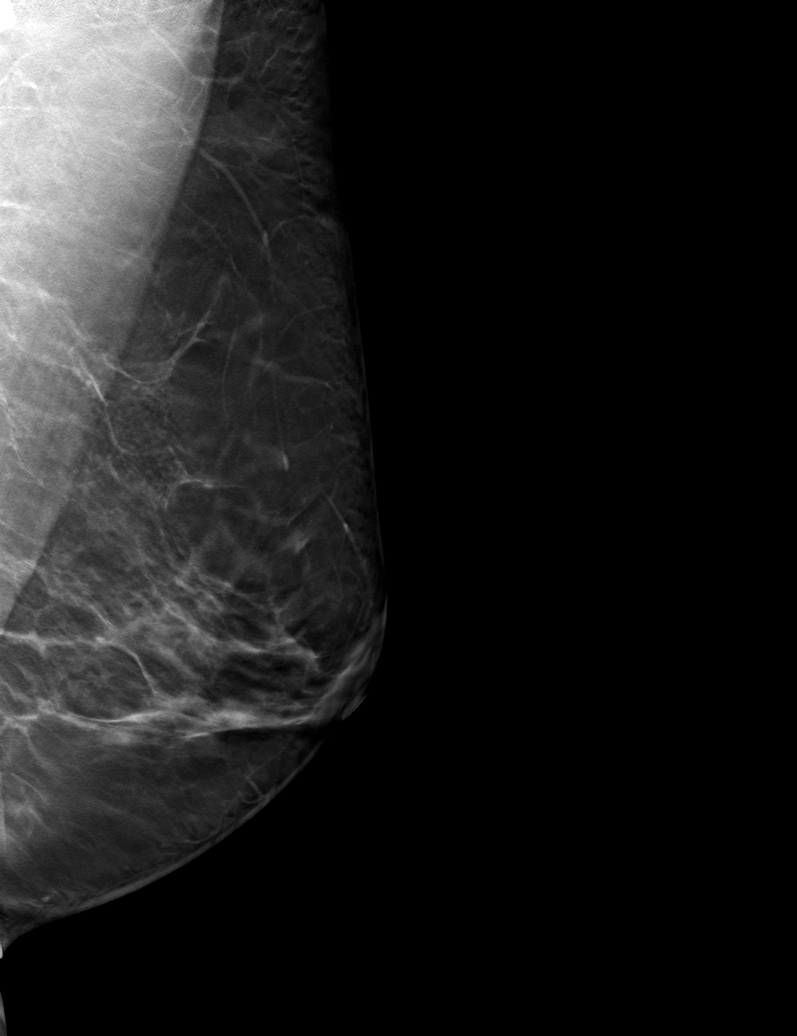
[frame 43/86]
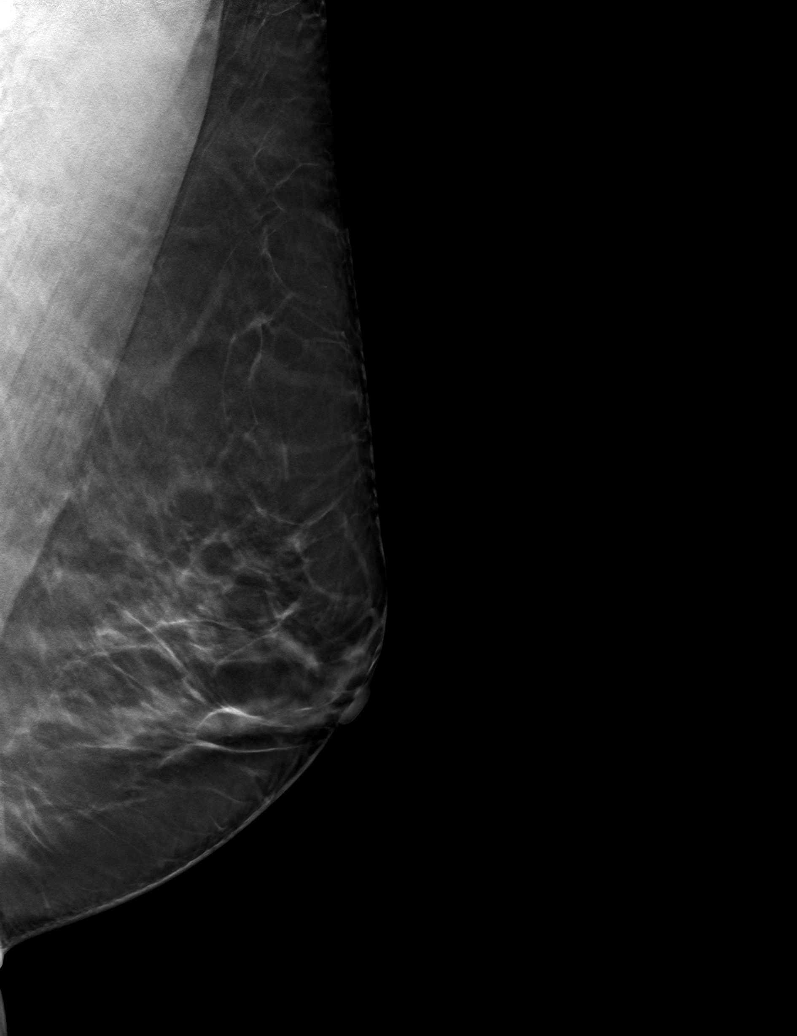

[L CC tomo · tomo slice 45/89.0]
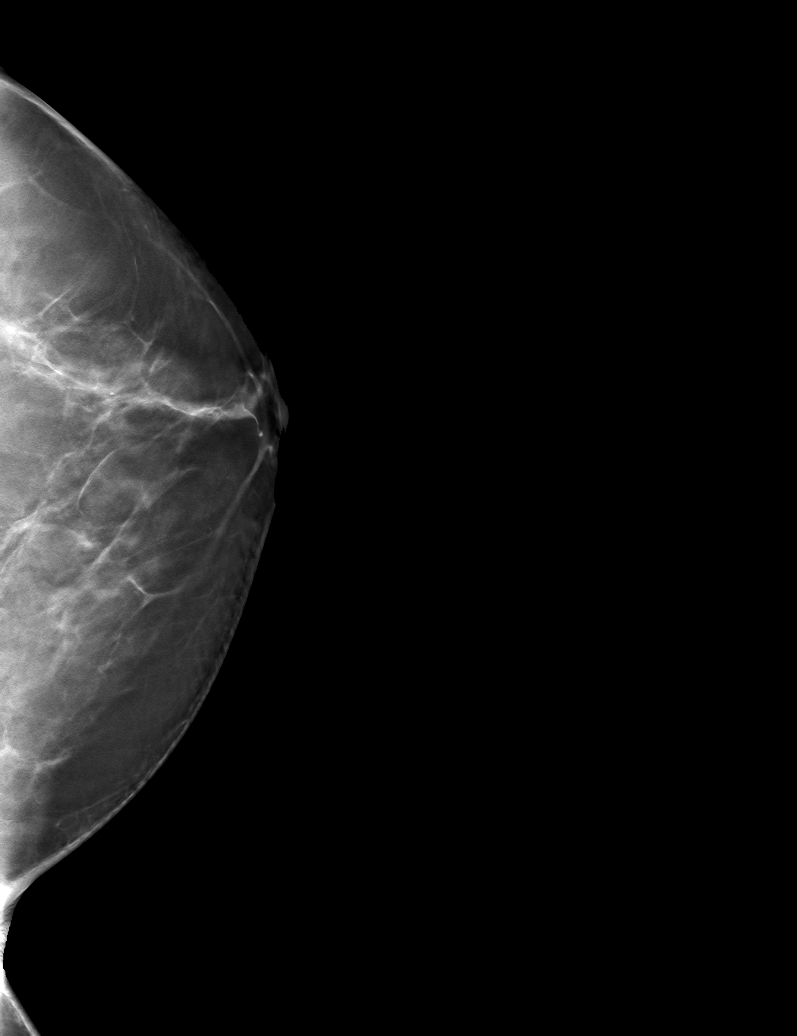

[6 of 13 positions shown; findings below may reference images not displayed]

ACR Breast Density Category b: There are scattered areas of
fibroglandular density.
FINDINGS: There are no findings suspicious for malignancy. Images were
processed with CAD.
IMPRESSION: No mammographic evidence of malignancy. A result letter of this
screening mammogram will be mailed directly to the patient.

RECOMMENDATION:
Screening mammogram in one year. (Code:[TQ])

BI-RADS CATEGORY  1: Negative.

## 2017-06-15 ENCOUNTER — Encounter (HOSPITAL_COMMUNITY): Payer: Medicare HMO

## 2017-06-15 ENCOUNTER — Ambulatory Visit (INDEPENDENT_AMBULATORY_CARE_PROVIDER_SITE_OTHER)
Admission: RE | Admit: 2017-06-15 | Discharge: 2017-06-15 | Disposition: A | Discharge status: Home / Self Care | Payer: Medicare HMO | Source: Ambulatory Visit | Attending: Gastroenterology | Admitting: Gastroenterology

## 2017-06-15 DIAGNOSIS — R140 Abdominal distension (gaseous): Secondary | ICD-10-CM

## 2017-06-15 DIAGNOSIS — R109 Unspecified abdominal pain: Secondary | ICD-10-CM | POA: Diagnosis not present

## 2017-06-15 IMAGING — CT CT ABD-PELV W/ CM
2 of 5 series · 16 of 46 positions shown, 18 images · IV contrast (ISOVUE 300)
Comparison: None.

CLINICAL DATA: Patient with abdominal pain and bloating,
postprandial for 2 months.

EXAM:
CT ABDOMEN AND PELVIS WITH CONTRAST
TECHNIQUE: Multidetector CT imaging of the abdomen and pelvis was performed
using the standard protocol following bolus administration of
intravenous contrast.
CONTRAST:  100mL [T9] IOPAMIDOL ([T9]) INJECTION 61%

[Series 2: abd/pel w · axial · 0.68mm/px · z∈[-438,-73]mm · 13 of 83 slices shown, 15 images]
[im 5/83  soft-tissue]
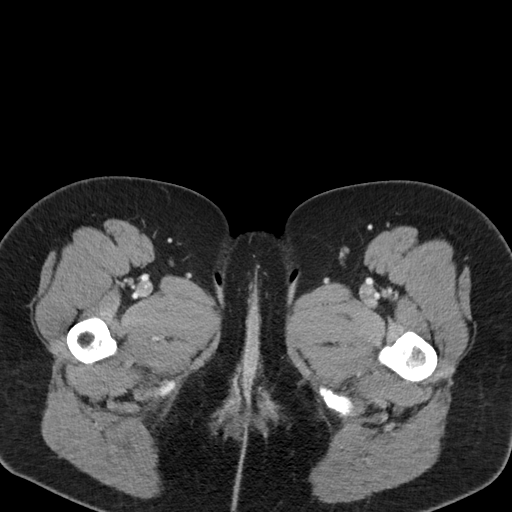
[im 5/83  bone]
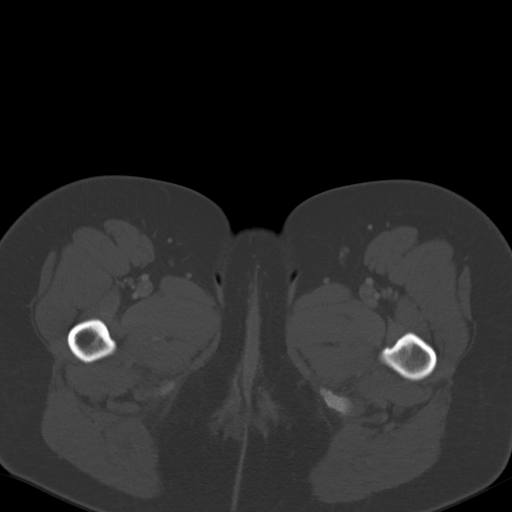
[im 13/83  soft-tissue]
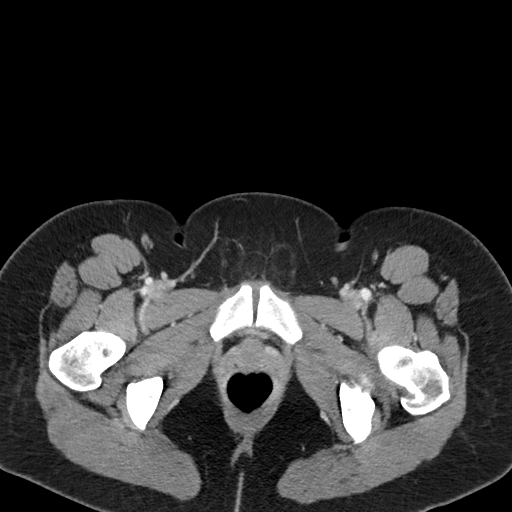
[im 18/83  soft-tissue]
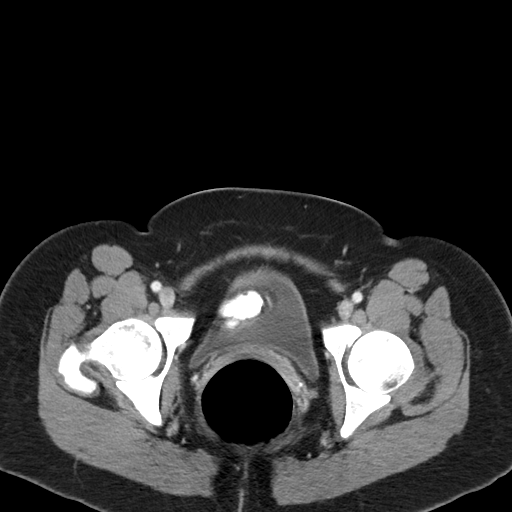
[im 22/83  soft-tissue]
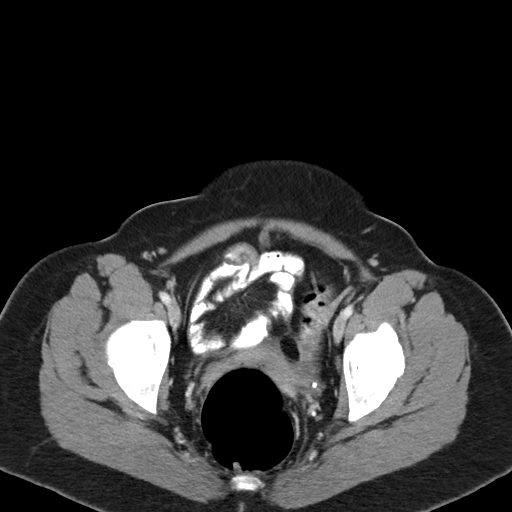
[im 31/83  soft-tissue]
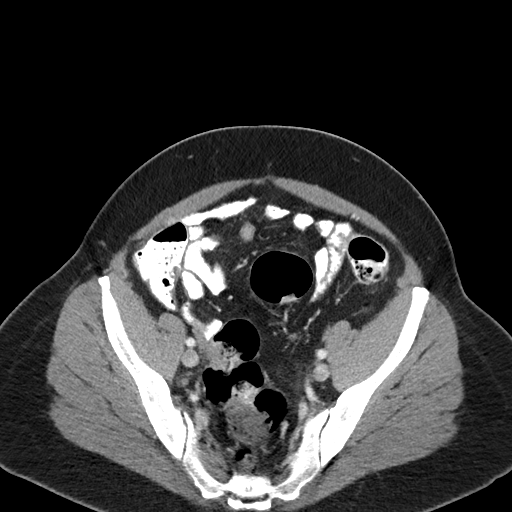
[im 35/83  soft-tissue]
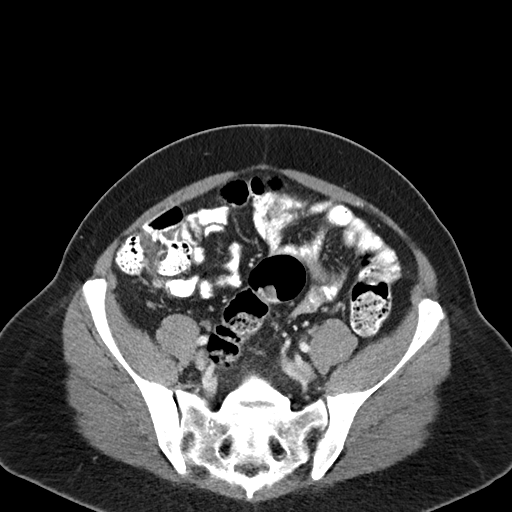
[im 44/83  soft-tissue]
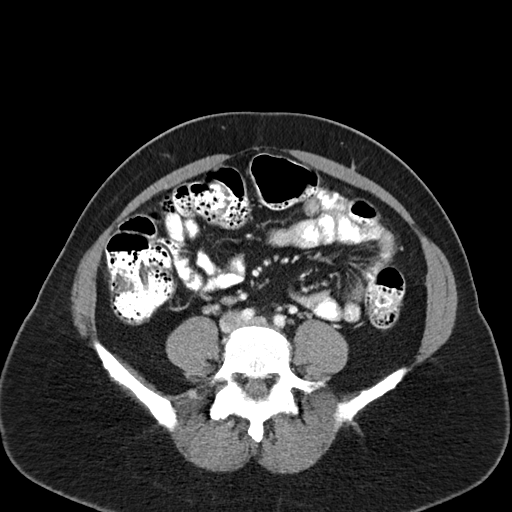
[im 48/83  soft-tissue]
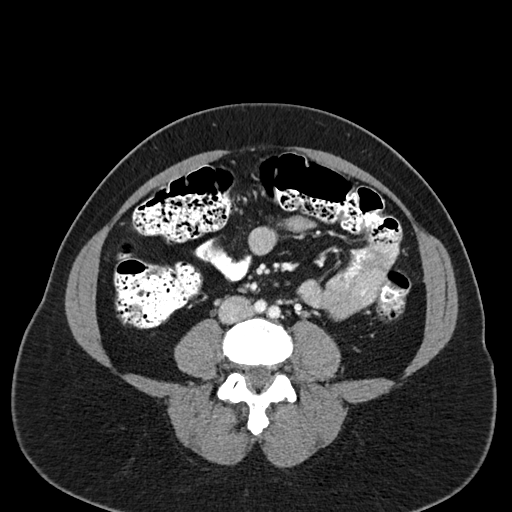
[im 52/83  soft-tissue]
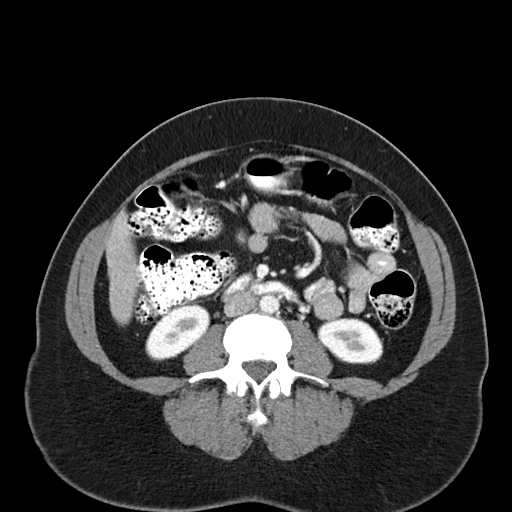
[im 52/83  bone]
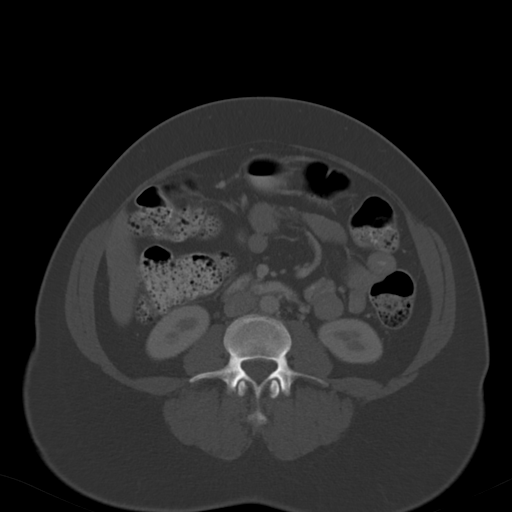
[im 61/83  soft-tissue]
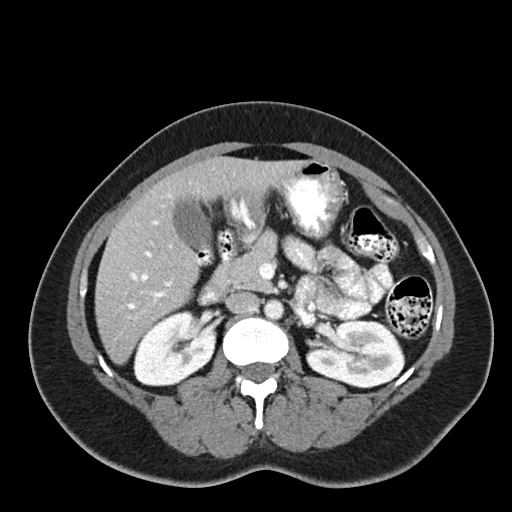
[im 65/83  soft-tissue]
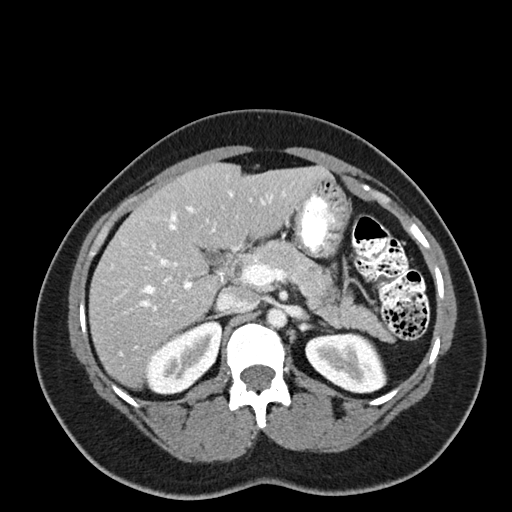
[im 70/83  soft-tissue]
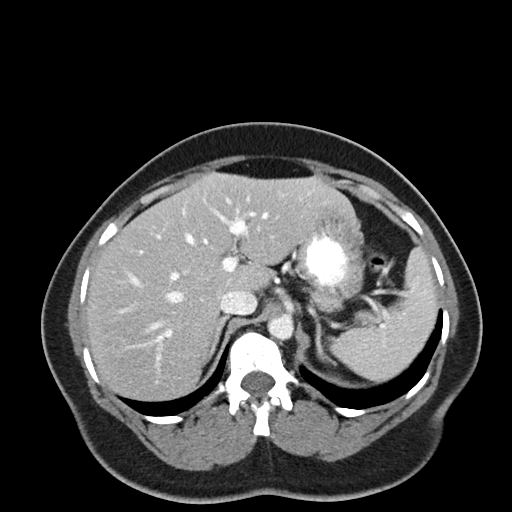
[im 78/83  soft-tissue]
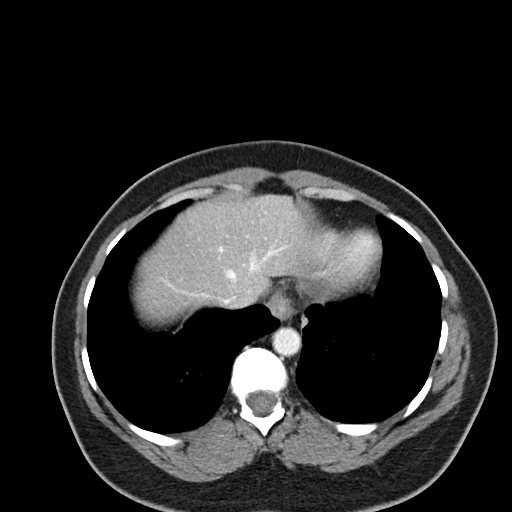

[Series 6: abd/pel w st · coronal · 0.56mm/px · 3 of 81 slices shown]
[im 27/81  soft-tissue]
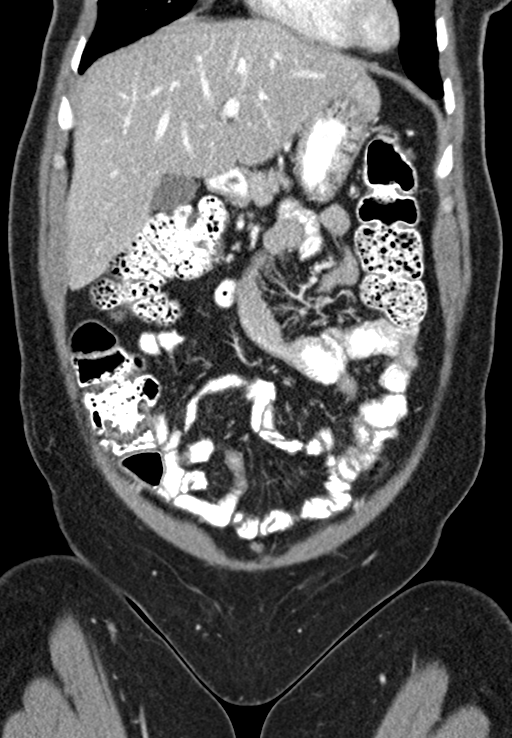
[im 36/81  soft-tissue]
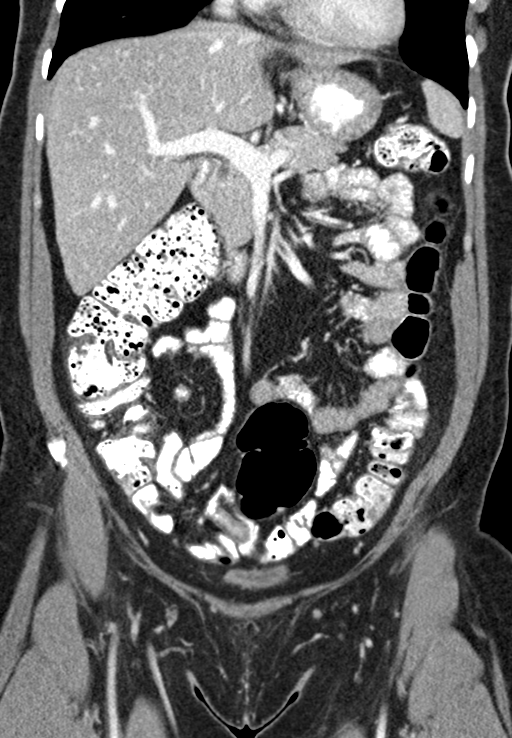
[im 45/81  soft-tissue]
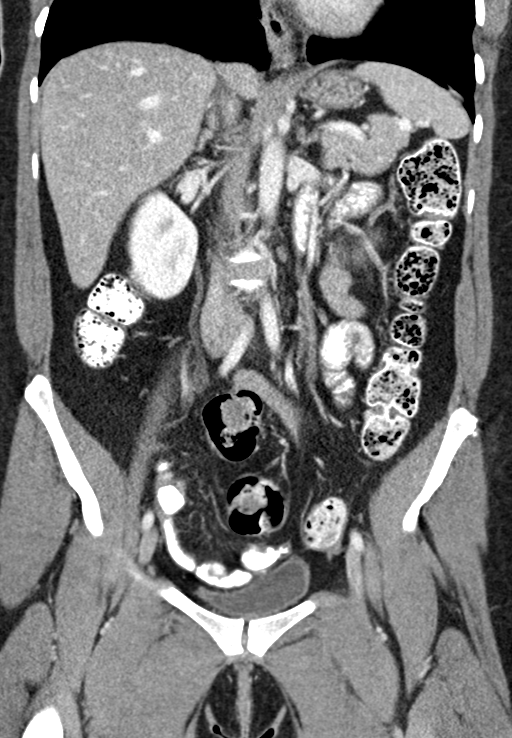

[16 of 46 positions shown; findings below may reference images not displayed]

FINDINGS: Lower chest: Normal heart size. Left lower lobe bronchiectasis. No
pleural effusion.

Hepatobiliary: Liver is normal in size and contour. No focal lesion
identified. Gallbladder is unremarkable. No intrahepatic or
extrahepatic biliary ductal dilatation.

Pancreas: Unremarkable

Spleen: Unremarkable

Adrenals/Urinary Tract: Adrenal glands are normal. Kidneys enhance
symmetrically with contrast. No hydronephrosis. Urinary bladder is
unremarkable.

Stomach/Bowel: Normal morphology of the stomach. Small hiatal
hernia. No abnormal bowel wall thickening or evidence for bowel
obstruction.

Vascular/Lymphatic: Normal caliber abdominal aorta. No
retroperitoneal lymphadenopathy.

Reproductive: Status post hysterectomy. Adnexal structures are
unremarkable.

Other: None.

Musculoskeletal: No aggressive or acute appearing osseous lesions.
IMPRESSION: No acute process within the abdomen or pelvis.

## 2017-06-15 MED ORDER — IOPAMIDOL (ISOVUE-300) INJECTION 61%
100.0000 mL | Freq: Once | INTRAVENOUS | Status: AC | PRN
  Administered 2017-06-15: 100 mL via INTRAVENOUS

## 2017-06-20 ENCOUNTER — Other Ambulatory Visit: Payer: Self-pay

## 2017-06-20 MED ORDER — RIFAXIMIN 200 MG PO TABS: 200.0000 mg | ORAL_TABLET | Freq: Three times a day (TID) | ORAL | 0 refills | Status: DC

## 2017-06-22 ENCOUNTER — Ambulatory Visit: Payer: Self-pay | Admitting: Gastroenterology

## 2017-06-23 ENCOUNTER — Other Ambulatory Visit: Payer: Self-pay | Admitting: Nurse Practitioner

## 2017-06-23 ENCOUNTER — Other Ambulatory Visit: Payer: Self-pay | Admitting: Acute Care

## 2017-06-23 ENCOUNTER — Ambulatory Visit (INDEPENDENT_AMBULATORY_CARE_PROVIDER_SITE_OTHER): Payer: Medicare HMO | Admitting: *Deleted

## 2017-06-23 VITALS — BP 118/62 | HR 76 | Resp 18 | Ht 67.0 in | Wt 181.0 lb

## 2017-06-23 DIAGNOSIS — K2100 Gastro-esophageal reflux disease with esophagitis, without bleeding: Secondary | ICD-10-CM

## 2017-06-23 DIAGNOSIS — Z0000 Encounter for general adult medical examination without abnormal findings: Secondary | ICD-10-CM

## 2017-06-23 NOTE — Patient Instructions (Signed)
Continue doing brain stimulating activities (puzzles, reading, adult coloring books, staying active) to keep memory sharp.   Continue to eat heart healthy diet (full of fruits, vegetables, whole grains, lean protein, water--limit salt, fat, and sugar intake) and increase physical activity as tolerated.   Hannah Barron , Thank you for taking time to come for your Medicare Wellness Visit. I appreciate your ongoing commitment to your health goals. Please review the following plan we discussed and let me know if I can assist you in the future.   These are the goals we discussed: Goals    . Patient Stated     Use relaxation techniques to help with breathing. Enjoy life and family.       This is a list of the screening recommended for you and due dates:  Health Maintenance  Topic Date Due  . Flu Shot  08/10/2017  . Pap Smear  04/02/2019  . Tetanus Vaccine  11/10/2024  . HIV Screening  Completed   Health Maintenance, Female Adopting a healthy lifestyle and getting preventive care can go a long way to promote health and wellness. Talk with your health care provider about what schedule of regular examinations is right for you. This is a good chance for you to check in with your provider about disease prevention and staying healthy. In between checkups, there are plenty of things you can do on your own. Experts have done a lot of research about which lifestyle changes and preventive measures are most likely to keep you healthy. Ask your health care provider for more information. Weight and diet Eat a healthy diet  Be sure to include plenty of vegetables, fruits, low-fat dairy products, and lean protein.  Do not eat a lot of foods high in solid fats, added sugars, or salt.  Get regular exercise. This is one of the most important things you can do for your health. ? Most adults should exercise for at least 150 minutes each week. The exercise should increase your heart rate and make you sweat  (moderate-intensity exercise). ? Most adults should also do strengthening exercises at least twice a week. This is in addition to the moderate-intensity exercise.  Maintain a healthy weight  Body mass index (BMI) is a measurement that can be used to identify possible weight problems. It estimates body fat based on height and weight. Your health care provider can help determine your BMI and help you achieve or maintain a healthy weight.  For females 7 years of age and older: ? A BMI below 18.5 is considered underweight. ? A BMI of 18.5 to 24.9 is normal. ? A BMI of 25 to 29.9 is considered overweight. ? A BMI of 30 and above is considered obese.  Watch levels of cholesterol and blood lipids  You should start having your blood tested for lipids and cholesterol at 47 years of age, then have this test every 5 years.  You may need to have your cholesterol levels checked more often if: ? Your lipid or cholesterol levels are high. ? You are older than 47 years of age. ? You are at high risk for heart disease.  Cancer screening Lung Cancer  Lung cancer screening is recommended for adults 31-20 years old who are at high risk for lung cancer because of a history of smoking.  A yearly low-dose CT scan of the lungs is recommended for people who: ? Currently smoke. ? Have quit within the past 15 years. ? Have at least a 30-pack-year  history of smoking. A pack year is smoking an average of one pack of cigarettes a day for 1 year.  Yearly screening should continue until it has been 15 years since you quit.  Yearly screening should stop if you develop a health problem that would prevent you from having lung cancer treatment.  Breast Cancer  Practice breast self-awareness. This means understanding how your breasts normally appear and feel.  It also means doing regular breast self-exams. Let your health care provider know about any changes, no matter how small.  If you are in your 20s or  30s, you should have a clinical breast exam (CBE) by a health care provider every 1-3 years as part of a regular health exam.  If you are 61 or older, have a CBE every year. Also consider having a breast X-ray (mammogram) every year.  If you have a family history of breast cancer, talk to your health care provider about genetic screening.  If you are at high risk for breast cancer, talk to your health care provider about having an MRI and a mammogram every year.  Breast cancer gene (BRCA) assessment is recommended for women who have family members with BRCA-related cancers. BRCA-related cancers include: ? Breast. ? Ovarian. ? Tubal. ? Peritoneal cancers.  Results of the assessment will determine the need for genetic counseling and BRCA1 and BRCA2 testing.  Cervical Cancer Your health care provider may recommend that you be screened regularly for cancer of the pelvic organs (ovaries, uterus, and vagina). This screening involves a pelvic examination, including checking for microscopic changes to the surface of your cervix (Pap test). You may be encouraged to have this screening done every 3 years, beginning at age 13.  For women ages 8-65, health care providers may recommend pelvic exams and Pap testing every 3 years, or they may recommend the Pap and pelvic exam, combined with testing for human papilloma virus (HPV), every 5 years. Some types of HPV increase your risk of cervical cancer. Testing for HPV may also be done on women of any age with unclear Pap test results.  Other health care providers may not recommend any screening for nonpregnant women who are considered low risk for pelvic cancer and who do not have symptoms. Ask your health care provider if a screening pelvic exam is right for you.  If you have had past treatment for cervical cancer or a condition that could lead to cancer, you need Pap tests and screening for cancer for at least 20 years after your treatment. If Pap tests  have been discontinued, your risk factors (such as having a new sexual partner) need to be reassessed to determine if screening should resume. Some women have medical problems that increase the chance of getting cervical cancer. In these cases, your health care provider may recommend more frequent screening and Pap tests.  Colorectal Cancer  This type of cancer can be detected and often prevented.  Routine colorectal cancer screening usually begins at 47 years of age and continues through 47 years of age.  Your health care provider may recommend screening at an earlier age if you have risk factors for colon cancer.  Your health care provider may also recommend using home test kits to check for hidden blood in the stool.  A small camera at the end of a tube can be used to examine your colon directly (sigmoidoscopy or colonoscopy). This is done to check for the earliest forms of colorectal cancer.  Routine screening usually  begins at age 50.  Direct examination of the colon should be repeated every 5-10 years through 47 years of age. However, you may need to be screened more often if early forms of precancerous polyps or small growths are found.  Skin Cancer  Check your skin from head to toe regularly.  Tell your health care provider about any new moles or changes in moles, especially if there is a change in a mole's shape or color.  Also tell your health care provider if you have a mole that is larger than the size of a pencil eraser.  Always use sunscreen. Apply sunscreen liberally and repeatedly throughout the day.  Protect yourself by wearing long sleeves, pants, a wide-brimmed hat, and sunglasses whenever you are outside.  Heart disease, diabetes, and high blood pressure  High blood pressure causes heart disease and increases the risk of stroke. High blood pressure is more likely to develop in: ? People who have blood pressure in the high end of the normal range (130-139/85-89 mm  Hg). ? People who are overweight or obese. ? People who are African American.  If you are 18-39 years of age, have your blood pressure checked every 3-5 years. If you are 40 years of age or older, have your blood pressure checked every year. You should have your blood pressure measured twice-once when you are at a hospital or clinic, and once when you are not at a hospital or clinic. Record the average of the two measurements. To check your blood pressure when you are not at a hospital or clinic, you can use: ? An automated blood pressure machine at a pharmacy. ? A home blood pressure monitor.  If you are between 55 years and 79 years old, ask your health care provider if you should take aspirin to prevent strokes.  Have regular diabetes screenings. This involves taking a blood sample to check your fasting blood sugar level. ? If you are at a normal weight and have a low risk for diabetes, have this test once every three years after 47 years of age. ? If you are overweight and have a high risk for diabetes, consider being tested at a younger age or more often. Preventing infection Hepatitis B  If you have a higher risk for hepatitis B, you should be screened for this virus. You are considered at high risk for hepatitis B if: ? You were born in a country where hepatitis B is common. Ask your health care provider which countries are considered high risk. ? Your parents were born in a high-risk country, and you have not been immunized against hepatitis B (hepatitis B vaccine). ? You have HIV or AIDS. ? You use needles to inject street drugs. ? You live with someone who has hepatitis B. ? You have had sex with someone who has hepatitis B. ? You get hemodialysis treatment. ? You take certain medicines for conditions, including cancer, organ transplantation, and autoimmune conditions.  Hepatitis C  Blood testing is recommended for: ? Everyone born from 1945 through 1965. ? Anyone with known  risk factors for hepatitis C.  Sexually transmitted infections (STIs)  You should be screened for sexually transmitted infections (STIs) including gonorrhea and chlamydia if: ? You are sexually active and are younger than 47 years of age. ? You are older than 47 years of age and your health care provider tells you that you are at risk for this type of infection. ? Your sexual activity has changed since   you were last screened and you are at an increased risk for chlamydia or gonorrhea. Ask your health care provider if you are at risk.  If you do not have HIV, but are at risk, it may be recommended that you take a prescription medicine daily to prevent HIV infection. This is called pre-exposure prophylaxis (PrEP). You are considered at risk if: ? You are sexually active and do not regularly use condoms or know the HIV status of your partner(s). ? You take drugs by injection. ? You are sexually active with a partner who has HIV.  Talk with your health care provider about whether you are at high risk of being infected with HIV. If you choose to begin PrEP, you should first be tested for HIV. You should then be tested every 3 months for as long as you are taking PrEP. Pregnancy  If you are premenopausal and you may become pregnant, ask your health care provider about preconception counseling.  If you may become pregnant, take 400 to 800 micrograms (mcg) of folic acid every day.  If you want to prevent pregnancy, talk to your health care provider about birth control (contraception). Osteoporosis and menopause  Osteoporosis is a disease in which the bones lose minerals and strength with aging. This can result in serious bone fractures. Your risk for osteoporosis can be identified using a bone density scan.  If you are 19 years of age or older, or if you are at risk for osteoporosis and fractures, ask your health care provider if you should be screened.  Ask your health care provider whether you  should take a calcium or vitamin D supplement to lower your risk for osteoporosis.  Menopause may have certain physical symptoms and risks.  Hormone replacement therapy may reduce some of these symptoms and risks. Talk to your health care provider about whether hormone replacement therapy is right for you. Follow these instructions at home:  Schedule regular health, dental, and eye exams.  Stay current with your immunizations.  Do not use any tobacco products including cigarettes, chewing tobacco, or electronic cigarettes.  If you are pregnant, do not drink alcohol.  If you are breastfeeding, limit how much and how often you drink alcohol.  Limit alcohol intake to no more than 1 drink per day for nonpregnant women. One drink equals 12 ounces of beer, 5 ounces of wine, or 1 ounces of hard liquor.  Do not use street drugs.  Do not share needles.  Ask your health care provider for help if you need support or information about quitting drugs.  Tell your health care provider if you often feel depressed.  Tell your health care provider if you have ever been abused or do not feel safe at home. This information is not intended to replace advice given to you by your health care provider. Make sure you discuss any questions you have with your health care provider. Document Released: 07/12/2010 Document Revised: 06/04/2015 Document Reviewed: 09/30/2014 Elsevier Interactive Patient Education  2018 [place] Stress Reduction Mindfulness-based stress reduction (MBSR) is a program that helps people learn to practice mindfulness. Mindfulness is the practice of intentionally paying attention to the present moment. It can be learned and practiced through techniques such as education, breathing exercises, meditation, and yoga. MBSR includes several mindfulness techniques in one program. MBSR works best when you understand the treatment, are willing to try new things, and can  commit to spending time practicing what you learn. MBSR training may  include learning about:  How your emotions, thoughts, and reactions affect your body.  New ways to respond to things that cause negative thoughts to start (triggers).  How to notice your thoughts and let go of them.  Practicing awareness of everyday things that you normally do without thinking.  The techniques and goals of different types of meditation.  What are the benefits of MBSR? MBSR can have many benefits, which include helping you to:  Develop self-awareness. This refers to knowing and understanding yourself.  Learn skills and attitudes that help you to participate in your own health care.  Learn new ways to care for yourself.  Be more accepting about how things are, and let things go.  Be less judgmental and approach things with an open mind.  Be patient with yourself and trust yourself more.  MBSR has also been shown to:  Reduce negative emotions, such as depression and anxiety.  Improve memory and focus.  Change how you sense and approach pain.  Boost your body's ability to fight infections.  Help you connect better with other people.  Improve your sense of well-being.  Follow these instructions at home:  Find a local in-person or online MBSR program.  Set aside some time regularly for mindfulness practice.  Find a mindfulness practice that works best for you. This may include one or more of the following: ? Meditation. Meditation involves focusing your mind on a certain thought or activity. ? Breathing awareness exercises. These help you to stay present by focusing on your breath. ? Body scan. For this practice, you lie down and pay attention to each part of your body from head to toe. You can identify tension and soreness and intentionally relax parts of your body. ? Yoga. Yoga involves stretching and breathing, and it can improve your ability to move and be flexible. It can also  provide an experience of testing your body's limits, which can help you release stress. ? Mindful eating. This way of eating involves focusing on the taste, texture, color, and smell of each bite of food. Because this slows down eating and helps you feel full sooner, it can be an important part of a weight-loss plan.  Find a podcast or recording that provides guidance for breathing awareness, body scan, or meditation exercises. You can listen to these any time when you have a free moment to rest without distractions.  Follow your treatment plan as told by your health care provider. This may include taking regular medicines and making changes to your diet or lifestyle as recommended. How to practice mindfulness To do a basic awareness exercise:  Find a comfortable place to sit.  Pay attention to the present moment. Observe your thoughts, feelings, and surroundings just as they are.  Avoid placing judgment on yourself, your feelings, or your surroundings. Make note of any judgment that comes up, and let it go.  Your mind may wander, and that is okay. Make note of when your thoughts drift, and return your attention to the present moment.  To do basic mindfulness meditation:  Find a comfortable place to sit. This may include a stable chair or a firm floor cushion. ? Sit upright with your back straight. Let your arms fall next to your side with your hands resting on your legs. ? If sitting in a chair, rest your feet flat on the floor. ? If sitting on a cushion, cross your legs in front of you.  Keep your head in a neutral position  with your chin dropped slightly. Relax your jaw and rest the tip of your tongue on the roof of your mouth. Drop your gaze to the floor. You can close your eyes if you like.  Breathe normally and pay attention to your breath. Feel the air moving in and out of your nose. Feel your belly expanding and relaxing with each breath.  Your mind may wander, and that is okay.  Make note of when your thoughts drift, and return your attention to your breath.  Avoid placing judgment on yourself, your feelings, or your surroundings. Make note of any judgment or feelings that come up, let them go, and bring your attention back to your breath.  When you are ready, lift your gaze or open your eyes. Pay attention to how your body feels after the meditation.  Where to find more information: You can find more information about MBSR from:  Your health care provider.  Community-based meditation centers or programs.  Programs offered near you.  Summary  Mindfulness-based stress reduction (MBSR) is a program that teaches you how to intentionally pay attention to the present moment. It is used with other treatments to help you cope better with daily stress, emotions, and pain.  MBSR focuses on developing self-awareness, which allows you to respond to life stress without judgment or negative emotions.  MBSR programs may involve learning different mindfulness practices, such as breathing exercises, meditation, yoga, body scan, or mindful eating. Find a mindfulness practice that works best for you, and set aside time for it on a regular basis. This information is not intended to replace advice given to you by your health care provider. Make sure you discuss any questions you have with your health care provider. Document Released: 05/05/2016 Document Revised: 05/05/2016 Document Reviewed: 05/05/2016 Elsevier Interactive Patient Education  [employer].

## 2017-06-23 NOTE — Progress Notes (Addendum)
Subjective:   Hannah Barron is a 47 y.o. female who presents for Medicare Annual (Subsequent) preventive examination.  Review of Systems:  No ROS.  Medicare Wellness Visit. Additional risk factors are reflected in the social history.  Cardiac Risk Factors include: advanced age (>73men, >33 women) Sleep patterns: has restless sleep, has frequent nighttime awakenings, gets up 1 times nightly to void and sleeps 5-6 hours nightly. Patient reports insomnia issues, discussed recommended sleep tips and stress reduction tips, education was attached to patient's AVS.  Home Safety/Smoke Alarms: Feels safe in home. Smoke alarms in place.  Living environment; residence and Firearm Safety: apartment, no firearms. Seat Belt Safety/Bike Helmet: Wears seat belt. Lives with husband, no needs for DME, good support system     Objective:     Vitals: BP 118/62   Pulse 76   Resp 18   Ht 5\' 7"  (1.702 m)   Wt 181 lb (82.1 kg)   LMP 03/31/2016   SpO2 99%   BMI 28.35 kg/m   Body mass index is 28.35 kg/m.  Advanced Directives 06/23/2017 02/17/2017 09/12/2016 04/11/2016 04/05/2016 03/29/2016 03/28/2016  Does Patient Have a Medical Advance Directive? No No No No No No No  Would patient like information on creating a medical advance directive? No - Patient declined No - Patient declined - No - Patient declined No - Patient declined - Yes (MAU/Ambulatory/Procedural Areas - Information given)    Tobacco Social History   Tobacco Use  Smoking Status Never Smoker  Smokeless Tobacco Never Used     Counseling given: Not Answered  Past Medical History:  Diagnosis Date  . Anemia   . Bronchiectasis ([place])   . COPD (chronic obstructive pulmonary disease) ([place])   . GERD (gastroesophageal reflux disease)   . Migraines   . Paroxysmal SVT (supraventricular tachycardia) ([place])    a. diagnosed in 11/2015.  . Right leg numbness    Past Surgical History:  Procedure Laterality Date  . CESAREAN SECTION    . CYSTOSCOPY  N/A 04/05/2016   Procedure: CYSTOSCOPY;  Surgeon: Lavonia Drafts, MD;  Location: [facility];  Service: Gynecology;  Laterality: N/A;  . LAPAROSCOPIC VAGINAL HYSTERECTOMY WITH SALPINGECTOMY Bilateral 04/05/2016   Procedure: LAPAROSCOPIC ASSISTED VAGINAL HYSTERECTOMY WITH SALPINGECTOMY;  Surgeon: Lavonia Drafts, MD;  Location: [facility];  Service: Gynecology;  Laterality: Bilateral;  . LUNG BIOPSY    . VIDEO BRONCHOSCOPY Bilateral 03/29/2016   Procedure: VIDEO BRONCHOSCOPY WITHOUT FLUORO;  Surgeon: Marshell Garfinkel, MD;  Location: [facility];  Service: Cardiopulmonary;  Laterality: Bilateral;   Family History  Problem Relation Age of Onset  . Healthy Mother   . Healthy Father    Social History   Socioeconomic History  . Marital status: Married    Spouse name: Not on file  . Number of children: 1  . Years of education: 52  . Highest education level: Not on file  Occupational History  . Occupation: disability  Social Needs  . Financial resource strain: Not hard at all  . Food insecurity:    Worry: Never true    Inability: Never true  . Transportation needs:    Medical: No    Non-medical: No  Tobacco Use  . Smoking status: Never Smoker  . Smokeless tobacco: Never Used  Substance and Sexual Activity  . Alcohol use: No    Alcohol/week: 0.0 oz  . Drug use: No  . Sexual activity: Yes  Lifestyle  . Physical activity:    Days per week: 0 days  Minutes per session: 0 min  . Stress: To some extent  Relationships  . Social connections:    Talks on phone: More than three times a week    Gets together: More than three times a week    Attends religious service: More than 4 times per year    Active member of club or organization: Yes    Attends meetings of clubs or organizations: More than 4 times per year    Relationship status: Married  Other Topics Concern  . Not on file  Social History Narrative   Fun: Watch TV    Outpatient Encounter Medications as of 06/23/2017    Medication Sig  . albuterol (VENTOLIN HFA) 108 (90 Base) MCG/ACT inhaler Inhale 2 puffs into the lungs every 6 (six) hours as needed for wheezing or shortness of breath.  . busPIRone (BUSPAR) 15 MG tablet Take 1 tablet (15 mg total) by mouth at bedtime.  . dicyclomine (BENTYL) 20 MG tablet Take 1 tablet (20 mg total) by mouth daily as needed for spasms.  Marland Kitchen estradiol (ESTRACE) 0.5 MG tablet Take 1 tablet (0.5 mg total) by mouth daily.  Marland Kitchen ibuprofen (ADVIL,MOTRIN) 400 MG tablet Take 1 tablet (400 mg total) by mouth every 6 (six) hours as needed.  . metoprolol succinate (TOPROL-XL) 50 MG 24 hr tablet Take 1 tablet (50mg ) in morning and 1/2 tablet (25mg ) in evening with or immediately following a meal. (Patient taking differently: Take 25-50 mg by mouth See admin instructions. 50 mg in the morning and 25 mg in the evening)  . omeprazole (PRILOSEC) 40 MG capsule Take 1 capsule (40 mg total) by mouth daily.  Marland Kitchen Respiratory Therapy Supplies (FLUTTER) DEVI Use as directed.  . RESTASIS 0.05 % ophthalmic emulsion Instill 1 drop into both eyes two times a day  . SYMBICORT 160-4.5 MCG/ACT inhaler INHALE 2 PUFFS BY MOUTH TWICE A DAY  . [DISCONTINUED] esomeprazole (NEXIUM) 20 MG capsule TAKE 1 CAPSULE EVERY DAY AT 12PM (Patient not taking: Reported on 06/23/2017)  . [DISCONTINUED] rifaximin (XIFAXAN) 200 MG tablet Take 1 tablet (200 mg total) by mouth 3 (three) times daily. (Patient not taking: Reported on 06/23/2017)  . [DISCONTINUED] SYMBICORT 160-4.5 MCG/ACT inhaler TAKE 2 PUFFS BY MOUTH TWICE A DAY   Facility-Administered Encounter Medications as of 06/23/2017  Medication  . 0.9 %  sodium chloride infusion    Activities of Daily Living In your present state of health, do you have any difficulty performing the following activities: 06/23/2017  Hearing? N  Vision? N  Difficulty concentrating or making decisions? N  Walking or climbing stairs? N  Dressing or bathing? N  Doing errands, shopping? N   Preparing Food and eating ? N  Using the Toilet? N  In the past six months, have you accidently leaked urine? N  Do you have problems with loss of bowel control? N  Managing your Medications? N  Managing your Finances? N  Housekeeping or managing your Housekeeping? N  Some recent data might be hidden    Patient Care Team: Lance Sell, NP as PCP - General (Nurse Practitioner)    Assessment:   This is a routine wellness examination for Zarra. Physical assessment deferred to PCP.   Exercise Activities and Dietary recommendations Current Exercise Habits: Structured exercise class, Type of exercise: walking;treadmill;strength training/weights, Time (Minutes): 45, Frequency (Times/Week): 3, Weekly Exercise (Minutes/Week): 135, Intensity: Mild, Exercise limited by: respiratory conditions(s)  Diet (meal preparation, eat out, water intake, caffeinated beverages, dairy products, fruits and  vegetables): in general, a "healthy" diet  , well balanced, eats a variety of fruits and vegetables daily, limits salt, fat/cholesterol, sugar,carbohydrates,caffeine, drinks 6-8 glasses of water daily.  Goals    . Patient Stated     Use relaxation techniques to help with breathing. Enjoy life and family.       Fall Risk Fall Risk  06/23/2017 02/17/2017  Falls in the past year? No No    Depression Screen PHQ 2/9 Scores 06/23/2017 06/06/2017 02/17/2017 12/29/2016  PHQ - 2 Score 2 4 0 3  PHQ- 9 Score 9 17 - 11     Cognitive Function       Ad8 score reviewed for issues:  Issues making decisions: no  Less interest in hobbies / activities: no  Repeats questions, stories (family complaining): no  Trouble using ordinary gadgets (microwave, computer, phone):no  Forgets the month or year: no  Mismanaging finances: no  Remembering appts: no  Daily problems with thinking and/or memory: no Ad8 score is= 0  Immunization History  Administered Date(s) Administered  . Influenza,inj,Quad  PF,6+ Mos 11/07/2015, 10/26/2016  . Pneumococcal Polysaccharide-23 11/07/2015   Screening Tests Health Maintenance  Topic Date Due  . INFLUENZA VACCINE  08/10/2017  . PAP SMEAR  04/02/2019  . TETANUS/TDAP  11/10/2024  . HIV Screening  Completed      Plan:    Continue doing brain stimulating activities (puzzles, reading, adult coloring books, staying active) to keep memory sharp.   Continue to eat heart healthy diet (full of fruits, vegetables, whole grains, lean protein, water--limit salt, fat, and sugar intake) and increase physical activity as tolerated.  I have personally reviewed and noted the following in the patient's chart:   . Medical and social history . Use of alcohol, tobacco or illicit drugs  . Current medications and supplements . Functional ability and status . Nutritional status . Physical activity . Advanced directives . List of other physicians . Vitals . Screenings to include cognitive, depression, and falls . Referrals and appointments  In addition, I have reviewed and discussed with patient certain preventive protocols, quality metrics, and best practice recommendations. A written personalized care plan for preventive services as well as general preventive health recommendations were provided to patient.     Michiel Cowboy, RN  06/23/2017    Medical screening examination/treatment/procedure(s) were performed by non-physician practitioner and as supervising physician I was immediately available for consultation/collaboration. I agree with above. Binnie Rail, MD

## 2017-06-26 ENCOUNTER — Ambulatory Visit (HOSPITAL_COMMUNITY)
Admission: EM | Admit: 2017-06-26 | Discharge: 2017-06-26 | Disposition: A | Discharge status: Home / Self Care | Payer: Medicare HMO | Attending: Urgent Care | Admitting: Urgent Care

## 2017-06-26 ENCOUNTER — Other Ambulatory Visit: Payer: Self-pay

## 2017-06-26 ENCOUNTER — Encounter (HOSPITAL_COMMUNITY): Payer: Self-pay | Admitting: Emergency Medicine

## 2017-06-26 DIAGNOSIS — M79601 Pain in right arm: Secondary | ICD-10-CM

## 2017-06-26 DIAGNOSIS — S46911A Strain of unspecified muscle, fascia and tendon at shoulder and upper arm level, right arm, initial encounter: Secondary | ICD-10-CM | POA: Diagnosis not present

## 2017-06-26 MED ORDER — CYCLOBENZAPRINE HCL 5 MG PO TABS: 5.0000 mg | ORAL_TABLET | Freq: Every day | ORAL | 0 refills | Status: DC

## 2017-06-26 MED ORDER — KETOROLAC TROMETHAMINE 60 MG/2ML IM SOLN
INTRAMUSCULAR | Status: AC
  Filled 2017-06-26: qty 2

## 2017-06-26 MED ORDER — MELOXICAM 7.5 MG PO TABS: 7.5000 mg | ORAL_TABLET | Freq: Every day | ORAL | 1 refills | Status: DC

## 2017-06-26 MED ORDER — KETOROLAC TROMETHAMINE 60 MG/2ML IM SOLN
60.0000 mg | Freq: Once | INTRAMUSCULAR | Status: AC
  Administered 2017-06-26: 60 mg via INTRAMUSCULAR

## 2017-06-26 NOTE — ED Provider Notes (Signed)
MRN: [id number] DOB: Nov 02, 1970  Subjective:   Hannah Barron is a 47 y.o. female presenting for 2-day history of right arm pain.  Denies fever, redness, warmth, bony deformity, bruising.  Patient's primary concern is whether or not she has a blood clot.  Denies history of clots.  Her symptoms started after she had to forcefully remove her arm out of her clothing as she was changing.  She has tried ibuprofen with minimal relief.   Current Facility-Administered Medications:  .  0.9 %  sodium chloride infusion, 500 mL, Intravenous, Once, Danis, Trosky, MD  Current Outpatient Medications:  .  albuterol (VENTOLIN HFA) 108 (90 Base) MCG/ACT inhaler, Inhale 2 puffs into the lungs every 6 (six) hours as needed for wheezing or shortness of breath., Disp: 3 Inhaler, Rfl: 3 .  busPIRone (BUSPAR) 15 MG tablet, Take 1 tablet (15 mg total) by mouth at bedtime., Disp: 30 tablet, Rfl: 1 .  dicyclomine (BENTYL) 20 MG tablet, Take 1 tablet (20 mg total) by mouth daily as needed for spasms., Disp: 30 tablet, Rfl: 2 .  estradiol (ESTRACE) 0.5 MG tablet, Take 1 tablet (0.5 mg total) by mouth daily., Disp: 30 tablet, Rfl: 6 .  ibuprofen (ADVIL,MOTRIN) 400 MG tablet, Take 1 tablet (400 mg total) by mouth every 6 (six) hours as needed., Disp: 90 tablet, Rfl: 0 .  metoprolol succinate (TOPROL-XL) 50 MG 24 hr tablet, Take 1 tablet (50mg ) in morning and 1/2 tablet (25mg ) in evening with or immediately following a meal. (Patient taking differently: Take 25-50 mg by mouth See admin instructions. 50 mg in the morning and 25 mg in the evening), Disp: 135 tablet, Rfl: 1 .  omeprazole (PRILOSEC) 40 MG capsule, Take 1 capsule (40 mg total) by mouth daily., Disp: 90 capsule, Rfl: 0 .  Respiratory Therapy Supplies (FLUTTER) DEVI, Use as directed., Disp: 1 each, Rfl: 0 .  RESTASIS 0.05 % ophthalmic emulsion, Instill 1 drop into both eyes two times a day, Disp: , Rfl: 2 .  SYMBICORT 160-4.5 MCG/ACT inhaler, INHALE 2 PUFFS BY MOUTH  TWICE A DAY, Disp: 10.2 Inhaler, Rfl: 5   No Known Allergies  Past Medical History:  Diagnosis Date  . Anemia   . Bronchiectasis ([place])   . COPD (chronic obstructive pulmonary disease) ([place])   . GERD (gastroesophageal reflux disease)   . Migraines   . Paroxysmal SVT (supraventricular tachycardia) ([place])    a. diagnosed in 11/2015.  . Right leg numbness      Past Surgical History:  Procedure Laterality Date  . CESAREAN SECTION    . CYSTOSCOPY N/A 04/05/2016   Procedure: CYSTOSCOPY;  Surgeon: Lavonia Drafts, MD;  Location: [facility];  Service: Gynecology;  Laterality: N/A;  . LAPAROSCOPIC VAGINAL HYSTERECTOMY WITH SALPINGECTOMY Bilateral 04/05/2016   Procedure: LAPAROSCOPIC ASSISTED VAGINAL HYSTERECTOMY WITH SALPINGECTOMY;  Surgeon: Lavonia Drafts, MD;  Location: [facility];  Service: Gynecology;  Laterality: Bilateral;  . LUNG BIOPSY    . VIDEO BRONCHOSCOPY Bilateral 03/29/2016   Procedure: VIDEO BRONCHOSCOPY WITHOUT FLUORO;  Surgeon: Marshell Garfinkel, MD;  Location: [facility];  Service: Cardiopulmonary;  Laterality: Bilateral;    Objective:   Vitals: BP 109/68 (BP Location: Left Arm)   Pulse 82   Temp 98.7 F (37.1 C) (Oral)   Resp 16   LMP 03/31/2016   SpO2 98%   Physical Exam  Constitutional: She is oriented to person, place, and time. She appears well-developed and well-nourished.  Cardiovascular: Normal rate.  Pulmonary/Chest: Effort normal.  Musculoskeletal:  Right upper arm: She exhibits tenderness (over area depicted without ecchymosis, swelling, erythema). She exhibits no bony tenderness, no swelling, no edema, no deformity and no laceration.       Arms: Neurological: She is alert and oriented to person, place, and time.   Assessment and Plan :   Right arm pain  Muscle strain of right upper arm, initial encounter  Will manage conservatively with meloxicam and Flexeril for right arm strain.  Recommended patient continue aggressive hydration.   Return to clinic precautions discussed.     Jaynee Eagles, PA-C 06/26/17 1233

## 2017-06-26 NOTE — Discharge Instructions (Signed)
Hydrate well with at least 2 liters (1 gallon) of water daily. Use 1-2 tablets of meloxicam once daily with Flexeril at bedtime.

## 2017-06-26 NOTE — ED Triage Notes (Signed)
Patient reports on Saturday hyperextended right arm while removing clothes, since then right arm/shoulder has been painful

## 2017-07-03 ENCOUNTER — Other Ambulatory Visit: Payer: Self-pay | Admitting: Gastroenterology

## 2017-07-03 ENCOUNTER — Telehealth: Payer: Self-pay | Admitting: Gastroenterology

## 2017-07-03 NOTE — Telephone Encounter (Signed)
Xifaxin Rx cost for the pt will be over $900. Would you like her to have a sample course? Please advise.

## 2017-07-09 MED ORDER — METRONIDAZOLE 500 MG PO TABS: 500.0000 mg | ORAL_TABLET | Freq: Three times a day (TID) | ORAL | 0 refills | Status: AC

## 2017-07-09 MED ORDER — CEPHALEXIN 250 MG PO CAPS: 500.0000 mg | ORAL_CAPSULE | Freq: Three times a day (TID) | ORAL | 0 refills | Status: AC

## 2017-07-09 NOTE — Telephone Encounter (Signed)
New plan:    I sent prescriptions for 2 antibiotics to take the place of that one. Much less expensive. I do not know if she ever drinks alcohol, but please advise her she cannot drink alcohol during the treatment course because it would interact with one of the meds.

## 2017-07-10 NOTE — Telephone Encounter (Signed)
Pt has been notified and aware. She states she never drinks alcohol.

## 2017-07-13 ENCOUNTER — Ambulatory Visit (HOSPITAL_COMMUNITY): Payer: Self-pay | Admitting: *Deleted

## 2017-07-13 DIAGNOSIS — J479 Bronchiectasis, uncomplicated: Secondary | ICD-10-CM

## 2017-07-13 NOTE — Progress Notes (Signed)
Discharge Progress Report  Patient Details  Name: Hannah Barron MRN: [id number] Date of Birth: 04-18-1970 Referring Provider:     Pulmonary Rehab Walk Test from 02/21/2017 in [facility]  Referring Provider  Dr. Vaughan Browner       Number of Visits: 23  Reason for Discharge:  Patient reached a stable level of exercise. Patient independent in their exercise. Patient has met program and personal goals.  Smoking History:  Social History   Tobacco Use  Smoking Status Never Smoker  Smokeless Tobacco Never Used    Diagnosis:  Bronchiectasis without complication ([place])  ADL UCSD: Pulmonary Assessment Scores    Row Name 06/08/17 1418 06/19/17 1145       ADL UCSD   ADL Phase  Exit  Exit    SOB Score total  -  84      CAT Score   CAT Score  -  38      mMRC Score   mMRC Score  1  -       Initial Exercise Prescription:   Discharge Exercise Prescription (Final Exercise Prescription Changes): Exercise Prescription Changes - 05/23/17 1200      Response to Exercise   Blood Pressure (Admit)  98/70    Blood Pressure (Exercise)  110/70    Blood Pressure (Exit)  100/50    Heart Rate (Admit)  99 bpm    Heart Rate (Exercise)  116 bpm    Heart Rate (Exit)  97 bpm    Oxygen Saturation (Admit)  99 %    Oxygen Saturation (Exercise)  99 %    Oxygen Saturation (Exit)  98 %    Rating of Perceived Exertion (Exercise)  13    Perceived Dyspnea (Exercise)  2    Duration  Continue with 45 min of aerobic exercise without signs/symptoms of physical distress.    Intensity  THRR unchanged      Progression   Progression  Continue to progress workloads to maintain intensity without signs/symptoms of physical distress.      Resistance Training   Training Prescription  Yes    Weight  orange bands    Reps  10-15    Time  10 Minutes      Treadmill   MPH  3.2    Grade  3    Minutes  17      Bike   Minutes  --      Recumbant Bike   Level  3    Minutes  17       NuStep   Level  5    Minutes  17    METs  2.5       Functional Capacity: 6 Minute Walk    Row Name 06/08/17 1416         6 Minute Walk   Phase  Discharge     Distance  1800 feet     Walk Time  6 minutes     # of Rest Breaks  0     MPH  3.4     METS  3.6     RPE  13     Perceived Dyspnea   3     Symptoms  No     Resting HR  103 bpm     Resting BP  104/64     Resting Oxygen Saturation   100 %     Exercise Oxygen Saturation  during 6 min walk  95 %  Max Ex. HR  122 bpm     Max Ex. BP  138/70       Interval HR   1 Minute HR  111     2 Minute HR  114     3 Minute HR  114     4 Minute HR  122     5 Minute HR  117     6 Minute HR  118     2 Minute Post HR  110     Interval Heart Rate?  Yes       Interval Oxygen   Interval Oxygen?  Yes     Baseline Oxygen Saturation %  100 %     1 Minute Oxygen Saturation %  98 %     1 Minute Liters of Oxygen  0 L     2 Minute Oxygen Saturation %  98 %     2 Minute Liters of Oxygen  0 L     3 Minute Oxygen Saturation %  95 %     3 Minute Liters of Oxygen  0 L     4 Minute Oxygen Saturation %  98 %     4 Minute Liters of Oxygen  0 L     5 Minute Oxygen Saturation %  99 %     5 Minute Liters of Oxygen  0 L     6 Minute Oxygen Saturation %  97 %     6 Minute Liters of Oxygen  0 L     2 Minute Post Oxygen Saturation %  99 %     2 Minute Post Liters of Oxygen  0 L        Psychological, QOL, Others - Outcomes: PHQ 2/9: Depression screen [facility] 2/9 06/23/2017 06/06/2017 02/17/2017 12/29/2016 08/23/2016  Decreased Interest 0 1 0 1 -  Down, Depressed, Hopeless 2 3 0 2 1  PHQ - 2 Score 2 4 0 3 1  Altered sleeping 3 3 - 0 0  Tired, decreased energy 2 3 - 3 2  Change in appetite 1 3 - 3 0  Feeling bad or failure about yourself  1 3 - 0 0  Trouble concentrating 0 1 - 2 0  Moving slowly or fidgety/restless 0 0 - - 0  Suicidal thoughts 0 0 - 0 0  PHQ-9 Score 9 17 - 11 3  Difficult doing work/chores Not difficult at all Not  difficult at all - - -    Quality of Life:   Personal Goals: Goals established at orientation with interventions provided to work toward goal.    Personal Goals Discharge: Goals and Risk Factor Review    Row Name 05/15/17 1437 07/13/17 1519           Core Components/Risk Factors/Patient Goals Review   Personal Goals Review  Improve shortness of breath with ADL's;Develop more efficient breathing techniques such as purse lipped breathing and diaphragmatic breathing and practicing self-pacing with activity.  Improve shortness of breath with ADL's;Develop more efficient breathing techniques such as purse lipped breathing and diaphragmatic breathing and practicing self-pacing with activity.      Review  patient was doing well in pulmonary rehab until her last several visits. she continues to complain of feeling "tired" and "having no energy". she recently had a workup with her PCP who found her vitamine D level was low and started her on a replacement. he also referred her back to a cardiologist. she has remained  at the same workload for several weeks related to her fatigue. she states that she loves coming to rehab however she has not gone to her community gym recently. she remains extremely sedentary at home. she continues to be encouraged and supported physically and emotionally and appreciative of all the care she recieves. hopefully she will be able to overcome this lethargy and tolerate workload increases over the next 30 days in order to increase her stamina and strength and decrease her shortness of breath. will continue to monitor closely.  Pt graduates with the completion of 23 exercise sessions. pt with improved endurance with the addition of vitamin D.  Pt with slight increase in workloads.  Pt able to model effective breathing techniques such as PLB and diaphragmatic breathing      Expected Outcomes  see admission expected outcomes  -         Exercise Goals and Review:   Nutrition  & Weight - Outcomes:  Post Biometrics - 06/08/17 1418       Post  Biometrics   Grip Strength  25 kg       Nutrition:   Nutrition Discharge: Nutrition Assessments - 06/23/17 0910      Rate Your Plate Scores   Pre Score  49 survey returned incomplete    Post Score  58       Education Questionnaire Score: Knowledge Questionnaire Score - 06/19/17 1146      Knowledge Questionnaire Score   Pre Score  12/13    Post Score  14/18       Goals reviewed with patient. Cherre Huger, BSN Cardiac and Training and development officer

## 2017-07-21 ENCOUNTER — Other Ambulatory Visit: Payer: Self-pay | Admitting: Urgent Care

## 2017-07-21 NOTE — Telephone Encounter (Signed)
Meloxicam refill Last refill: 06/26/17 30 tab/1 refill (Urgent Care) [id number] Pharmacy: [id number] - [place], [address] [phone] (Phone) [phone] (Fax)

## 2017-07-24 NOTE — Telephone Encounter (Signed)
Ashleigh approved and sent to pof.Marland KitchenJohny Chess

## 2017-07-25 ENCOUNTER — Encounter

## 2017-07-25 ENCOUNTER — Other Ambulatory Visit (HOSPITAL_COMMUNITY)
Admission: RE | Admit: 2017-07-25 | Discharge: 2017-07-25 | Disposition: A | Discharge status: Home / Self Care | Payer: Medicare HMO | Source: Ambulatory Visit | Attending: Obstetrics & Gynecology | Admitting: Obstetrics & Gynecology

## 2017-07-25 ENCOUNTER — Ambulatory Visit (INDEPENDENT_AMBULATORY_CARE_PROVIDER_SITE_OTHER): Payer: Medicare HMO | Admitting: Obstetrics & Gynecology

## 2017-07-25 ENCOUNTER — Encounter: Payer: Self-pay | Admitting: Obstetrics & Gynecology

## 2017-07-25 VITALS — BP 117/65 | HR 95 | Ht 67.0 in | Wt 178.7 lb

## 2017-07-25 DIAGNOSIS — B373 Candidiasis of vulva and vagina: Secondary | ICD-10-CM | POA: Diagnosis not present

## 2017-07-25 DIAGNOSIS — N951 Menopausal and female climacteric states: Secondary | ICD-10-CM

## 2017-07-25 DIAGNOSIS — B3731 Acute candidiasis of vulva and vagina: Secondary | ICD-10-CM

## 2017-07-25 MED ORDER — CLOTRIMAZOLE 1 % VA CREA: TOPICAL_CREAM | VAGINAL | 2 refills | Status: DC

## 2017-07-25 MED ORDER — FLUCONAZOLE 150 MG PO TABS: ORAL_TABLET | ORAL | 1 refills | Status: DC

## 2017-07-25 MED ORDER — ESTRADIOL 0.05 MG/24HR TD PTWK: 0.0500 mg | MEDICATED_PATCH | TRANSDERMAL | 12 refills | Status: DC

## 2017-07-25 NOTE — Progress Notes (Signed)
Pt states some times have pain in stomach, sometimes when she is washing she sees a little blood.

## 2017-07-25 NOTE — Progress Notes (Signed)
History:  47 y.o. G1P1001 here today for eval of vagina itching and blood from her vagina when washing.   She is also here to f/u HRT. She reports that her sx are markedly improved on HRT. She denies side effect. Her mood has improved and her hot flushes are better.        The following portions of the patient's history were reviewed and updated as appropriate: allergies, current medications, past family history, past medical history, past social history, past surgical history and problem list.  Review of Systems:  Pertinent items are noted in HPI.    Objective:  Physical Exam Blood pressure 117/65, pulse 95, height 5\' 7"  (1.702 m), weight 178 lb 11.2 oz (81.1 kg), last menstrual period 03/31/2016.  CONSTITUTIONAL: Well-developed, well-nourished female in no acute distress.  HENT:  Normocephalic, atraumatic EYES: Conjunctivae and EOM are normal. No scleral icterus.  NECK: Normal range of motion SKIN: Skin is warm and dry. No rash noted. Not diaphoretic.No pallor. [place]: Alert and oriented to person, place, and time. Normal coordination.  Pelvic: Normal appearing external genitalia; normal appearing vaginal mucosa. Cervix and uterus surgically absent.  There is thick white discharge. There is a rash on her upper thighs with satellite lesions noted.   Assessment & Plan:  1. Vulvovaginitis due to yeast  - clotrimazole (GYNE-LOTRIMIN) 1 % vaginal cream; Apply to affected areas twice a day  Dispense: 60 g; Refill: 2 - fluconazole (DIFLUCAN) 150 MG tablet; repeat in 3 days  Dispense: 2 tablet; Refill: 1 - Cervicovaginal ancillary only  2. Menopausal state sx markedly improved on HRT.  I have reviewed changing pt to patches in lieu of oral meds to decrease her risk of TEE - estradiol (CLIMARA) 0.05 mg/24hr patch; Place 1 patch (0.05 mg total) onto the skin once a week.  Dispense: 4 patch; Refill: 12  F/u in 6 weeks to assess response to he patch.   Total face-to-face time with patient  was 15 min.  Greater than 50% was spent in counseling and coordination of care with the patient.   Lakasha Mcfall L. Harraway-Smith, M.D., Cherlynn June

## 2017-07-26 LAB — CERVICOVAGINAL ANCILLARY ONLY
BACTERIAL VAGINITIS: NEGATIVE
CHLAMYDIA, DNA PROBE: NEGATIVE
Candida vaginitis: POSITIVE — AB
NEISSERIA GONORRHEA: NEGATIVE
Trichomonas: NEGATIVE

## 2017-07-27 ENCOUNTER — Encounter: Payer: Self-pay | Admitting: Obstetrics & Gynecology

## 2017-08-04 ENCOUNTER — Other Ambulatory Visit: Payer: Self-pay | Admitting: Nurse Practitioner

## 2017-08-16 ENCOUNTER — Ambulatory Visit (INDEPENDENT_AMBULATORY_CARE_PROVIDER_SITE_OTHER)
Admission: RE | Admit: 2017-08-16 | Discharge: 2017-08-16 | Disposition: A | Discharge status: Home / Self Care | Payer: Medicare HMO | Source: Ambulatory Visit | Attending: Nurse Practitioner | Admitting: Nurse Practitioner

## 2017-08-16 ENCOUNTER — Ambulatory Visit (INDEPENDENT_AMBULATORY_CARE_PROVIDER_SITE_OTHER): Payer: Medicare HMO | Admitting: Nurse Practitioner

## 2017-08-16 ENCOUNTER — Encounter: Payer: Self-pay | Admitting: Nurse Practitioner

## 2017-08-16 ENCOUNTER — Other Ambulatory Visit (INDEPENDENT_AMBULATORY_CARE_PROVIDER_SITE_OTHER): Payer: Medicare HMO

## 2017-08-16 VITALS — BP 112/64 | HR 92 | Temp 98.6°F | Resp 16 | Ht 67.0 in | Wt 182.0 lb

## 2017-08-16 DIAGNOSIS — R300 Dysuria: Secondary | ICD-10-CM

## 2017-08-16 DIAGNOSIS — R140 Abdominal distension (gaseous): Secondary | ICD-10-CM | POA: Diagnosis not present

## 2017-08-16 DIAGNOSIS — R350 Frequency of micturition: Secondary | ICD-10-CM

## 2017-08-16 DIAGNOSIS — R109 Unspecified abdominal pain: Secondary | ICD-10-CM

## 2017-08-16 LAB — COMPREHENSIVE METABOLIC PANEL
ALT: 29 U/L (ref 0–35)
AST: 23 U/L (ref 0–37)
Albumin: 3.9 g/dL (ref 3.5–5.2)
Alkaline Phosphatase: 69 U/L (ref 39–117)
BILIRUBIN TOTAL: 0.2 mg/dL (ref 0.2–1.2)
BUN: 7 mg/dL (ref 6–23)
CALCIUM: 9.4 mg/dL (ref 8.4–10.5)
CHLORIDE: 104 meq/L (ref 96–112)
CO2: 29 meq/L (ref 19–32)
CREATININE: 0.74 mg/dL (ref 0.40–1.20)
GFR: 89.24 mL/min (ref >60.00)
Glucose, Bld: 116 mg/dL — ABNORMAL HIGH (ref 70–99)
Potassium: 3.7 mEq/L (ref 3.5–5.1)
SODIUM: 138 meq/L (ref 135–145)
Total Protein: 7.4 g/dL (ref 6.0–8.3)

## 2017-08-16 LAB — POCT URINALYSIS DIPSTICK
Bilirubin, UA: NEGATIVE
Blood, UA: NEGATIVE
Glucose, UA: NEGATIVE
KETONES UA: NEGATIVE
Leukocytes, UA: NEGATIVE
Nitrite, UA: NEGATIVE
PH UA: 6 (ref 5.0–8.0)
Protein, UA: NEGATIVE
Spec Grav, UA: 1.015 (ref 1.010–1.025)
UROBILINOGEN UA: NEGATIVE U/dL — AB

## 2017-08-16 LAB — CBC
HEMATOCRIT: 38.9 % (ref 36.0–46.0)
Hemoglobin: 13.1 g/dL (ref 12.0–15.0)
MCHC: 33.7 g/dL (ref 30.0–36.0)
MCV: 95.4 fl (ref 78.0–100.0)
Platelets: 264 10*3/uL (ref 150.0–400.0)
RBC: 4.08 Mil/uL (ref 3.87–5.11)
RDW: 13 % (ref 11.5–15.5)
WBC: 5.1 10*3/uL (ref 4.0–10.5)

## 2017-08-16 LAB — LIPASE: Lipase: 31 U/L (ref 11.0–59.0)

## 2017-08-16 IMAGING — DX DG ABDOMEN 2V
2 series · 2 of 2 positions shown · non-contrast
Comparison: CT [DATE].

CLINICAL DATA: Left abdominal pain and bloating over the last
several days.

EXAM:
ABDOMEN - 2 VIEW

[abdomen erect]
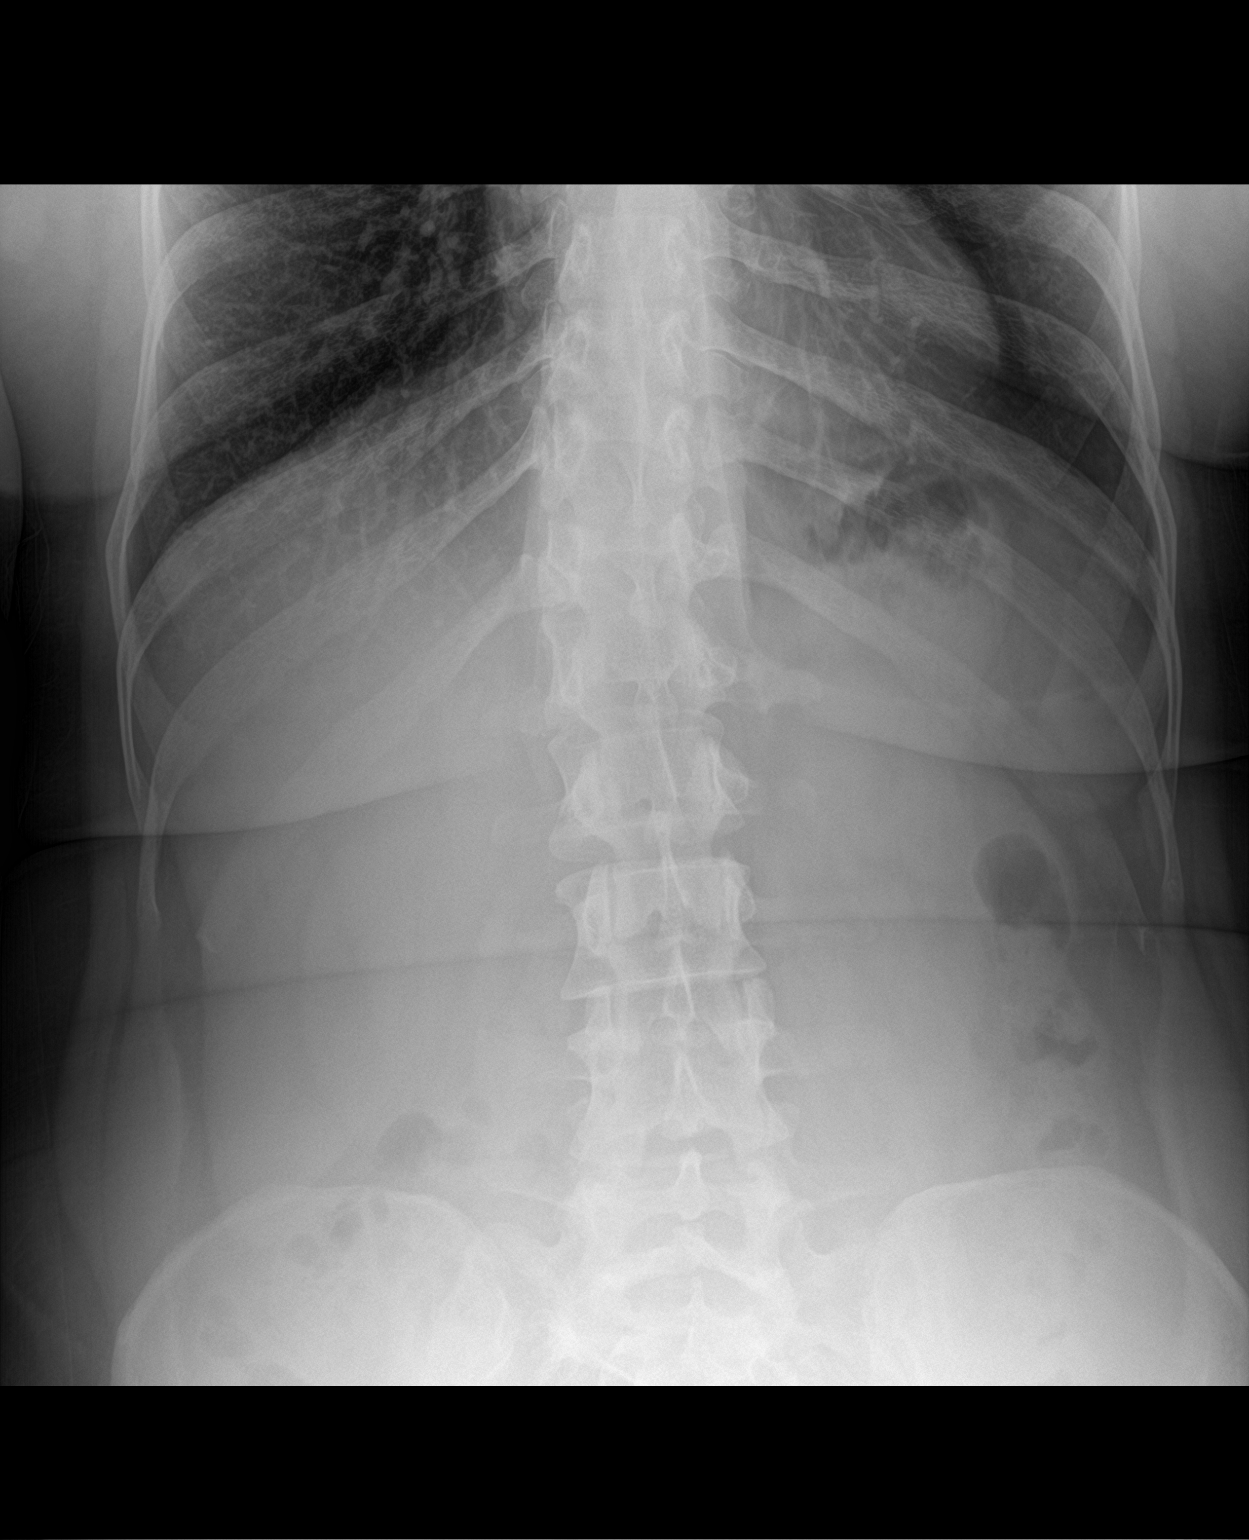

[abdomen supine]
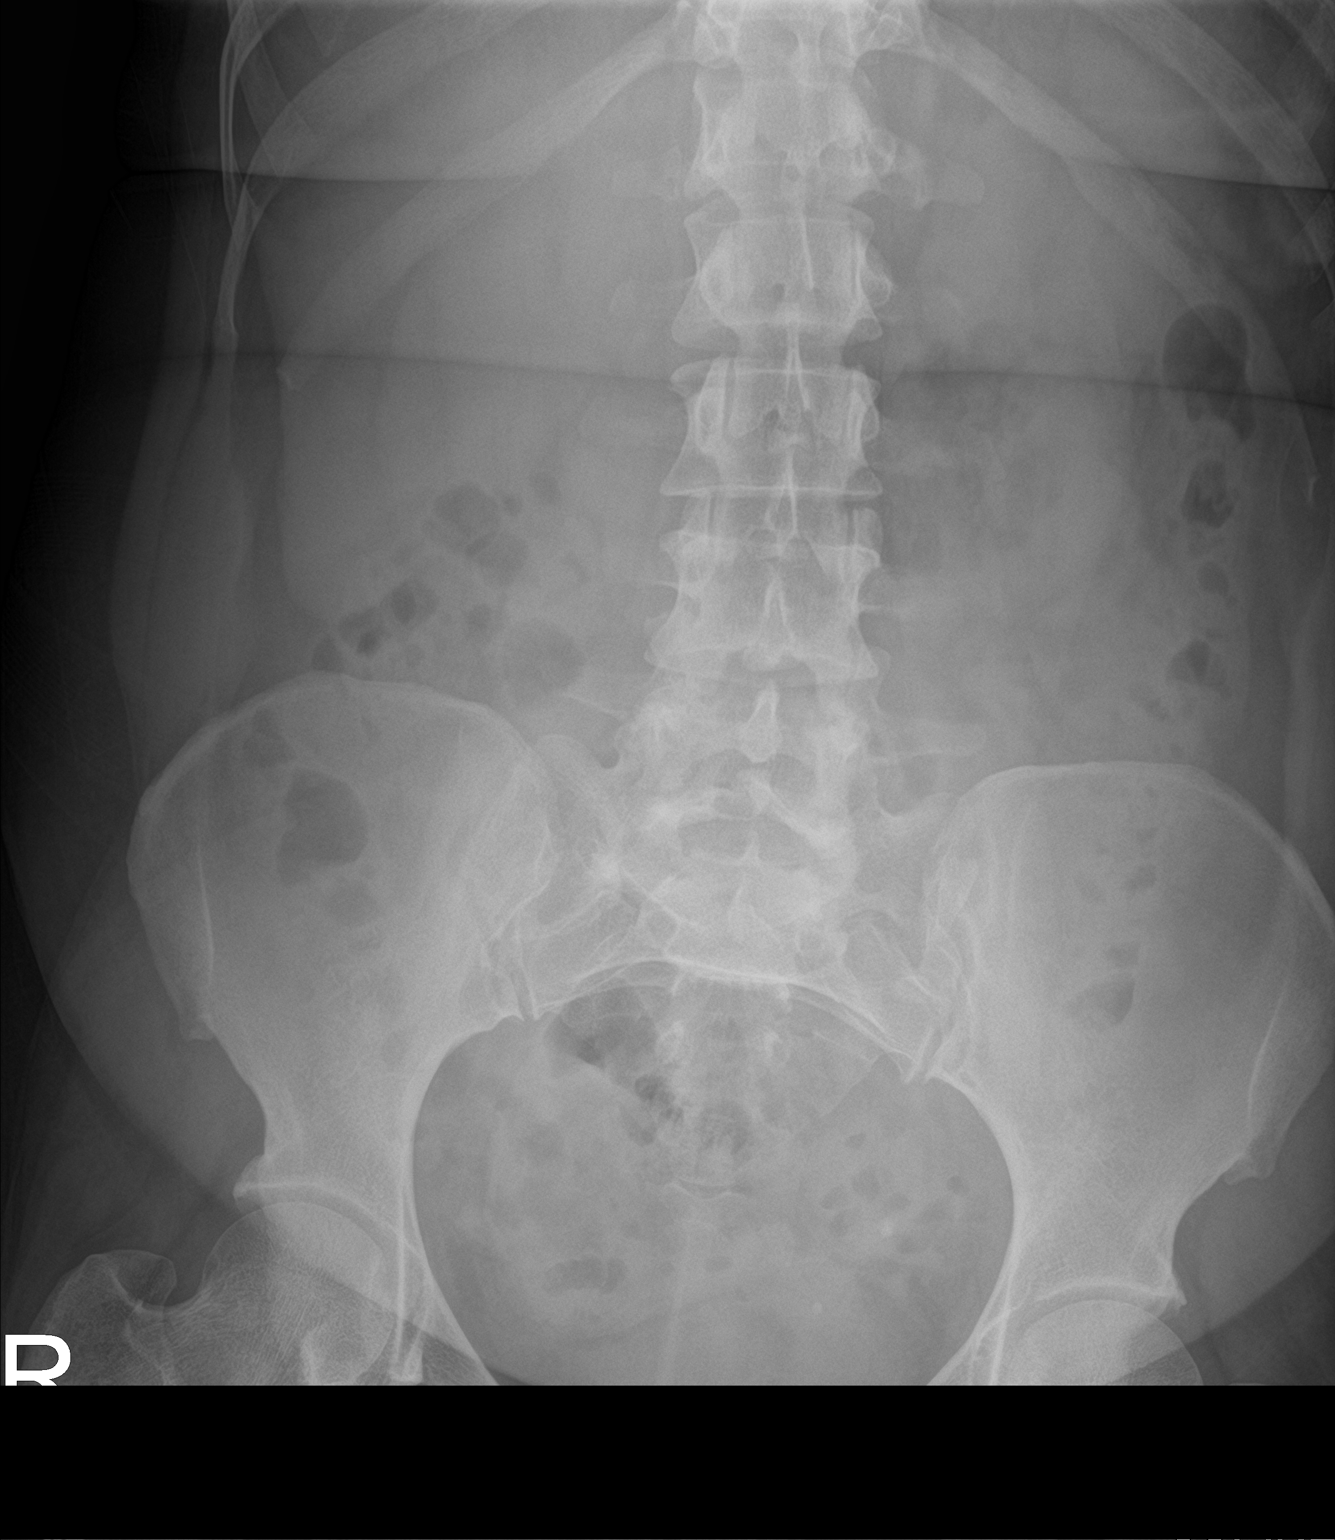

[2 of 2 positions shown; findings below may reference images not displayed]

FINDINGS: The bowel gas pattern is normal. Normal amount of fecal matter.
There is no evidence of free air. No radio-opaque calculi or other
significant radiographic abnormality is seen.
IMPRESSION: Negative.

## 2017-08-16 NOTE — Patient Instructions (Signed)
Your labs are normal. Your xray Is normal.  Please try avoidance of gluten and adding a daily probiotic.  Please return for a follow up in about 2-3 weeks, so I can see how you are doing  Foods to avoid that contain gluten Grains  Barley, bran, bulgur, couscous, cracked wheat, [place], farro, graham, malt, matzo, semolina, wheat germ, and all wheat and rye cereals including spelt and kamut. Cereals containing malt as a flavoring, such as rice cereal. Noodles, spaghetti, macaroni, most packaged rice mixes, and all mixes containing wheat, rye, barley, or triticale. Vegetables  Most creamed vegetables and most vegetables canned in sauces. Some commercially prepared vegetables and salads. Fruits  Thickened or prepared fruits and some pie fillings. Some fruit snacks and fruit roll-ups. Meats and other protein foods  Any meat or meat alternative containing wheat, rye, barley, or gluten stabilizers. These are often marinated or packaged meats and lunch meats. Bread-containing products, such as Swiss steak, croquettes, meatballs, and meatloaf. Most tuna canned in vegetable broth and [place] with hydrolyzed vegetable protein (HVP) injected as part of the basting. Seitan. Imitation fish. Eggs in sauces made from ingredients to avoid. Dairy  Commercial chocolate milk drinks and malted milk. Some non-dairy creamers. Any cheese product containing ingredients to avoid. Beverages  Certain cereal beverages. Beer, ale, malted milk, and some root beers. Some hard ciders. Some instant flavored coffees. Some herbal teas made with barley or with barley malt added. Fats and oils  Some commercial salad dressings. Sour cream containing modified food starch. Sweets and desserts  Some toffees. Chocolate-coated nuts (may be rolled in wheat flour) and some commercial candies and candy bars. Most cakes, cookies, donuts, pastries, and other baked goods. Some commercial ice cream. Ice cream cones. Commercially  prepared mixes for cakes, cookies, and other desserts. Bread pudding and other puddings thickened with flour. Products containing brown rice syrup made with barley malt enzyme. Desserts and sweets made with malt flavoring. Seasoning and other foods  Some curry powders, some dry seasoning mixes, some gravy extracts, some meat sauces, some ketchups, some prepared mustards, and horseradish. Certain soy sauces. Malt vinegar. Bouillon and bouillon cubes that contain HVP. Some chip dips, and some chewing gum. Yeast extract. Brewer's yeast. Caramel color. Some medicines and supplements. Some lip glosses and other cosmetics.   Probiotics What are probiotics? Probiotics are the good bacteria and yeasts that live in your body and keep you and your digestive system healthy. Probiotics also help your body's defense (immune) system and protect your body against bad bacterial growth. Certain foods contain probiotics, such as yogurt. Probiotics can also be purchased as a supplement. As with any supplement or drug, it is important to discuss its use with your health care provider. What affects the balance of bacteria in my body? The balance of bacteria in your body can be affected by:  Antibiotic medicines. Antibiotics are sometimes necessary to treat infection. Unfortunately, they may kill good or friendly bacteria in your body as well as the bad bacteria. This may lead to stomach problems like diarrhea, gas, and cramping.  Disease. Some conditions are the result of an overgrowth of bad bacteria, yeasts, parasites, or fungi. These conditions include: ? Infectious diarrhea. ? Stomach and respiratory infections. ? Skin infections. ? Irritable bowel syndrome (IBS). ? Inflammatory bowel diseases. ? Ulcer due to Helicobacter pylori (H. pylori) infection. ? Tooth decay and periodontal disease. ? Vaginal infections.  Stress and poor diet may also lower the good bacteria in your body. What  type of probiotic is  right for me? Probiotics are available over the counter at your local pharmacy, health food, or grocery store. They come in many different forms, combinations of strains, and dosing strengths. Some may need to be refrigerated. Always read the label for storage and usage instructions. Specific strains have been shown to be more effective for certain conditions. Ask your health care provider what option is best for you. Why would I need probiotics? There are many reasons your health care provider might recommend a probiotic supplement, including:  Diarrhea.  Constipation.  IBS.  Respiratory infections.  Yeast infections.  Acne, eczema, and other skin conditions.  Frequent urinary tract infections (UTIs).  Are there side effects of probiotics? Some people experience mild side effects when taking probiotics. Side effects are usually temporary and may include:  Gas.  Bloating.  Cramping.  Rarely, serious side effects, such as infection or immune system changes, may occur. What else do I need to know about probiotics?  There are many different strains of probiotics. Certain strains may be more effective depending on your condition. Probiotics are available in varying doses. Ask your health care provider which probiotic you should use and how often.  If you are taking probiotics along with antibiotics, it is generally recommended to wait at least 2 hours between taking the antibiotic and taking the probiotic. For more information: [facility] [email or web address] This information is not intended to replace advice given to you by your health care provider. Make sure you discuss any questions you have with your health care provider. Document Released: 07/24/2013 Document Revised: 11/24/2015 Document Reviewed: 03/26/2013 Elsevier Interactive Patient Education  2017 [employer].

## 2017-08-16 NOTE — Progress Notes (Signed)
Name: Hannah Barron   MRN: [id number]    DOB: [birth year]   Date:08/16/2017       Progress Note  Subjective  Chief Complaint  Chief Complaint  Patient presents with  . Abdominal Pain    having abominal pain that has been going on on and off since last, having urinary sxs, dysuria and urinary frequency    HPI   Hannah Barron is here today for evaluation of an acute complaint of abdominal pain and urinary symptoms. She reports she first noticed left side, flank, and upper abdominal pain last week that seemed to resolve, then returned 2 days ago and has been constant since. She also reports malaise, hot sweats, decreased appetite, dysuria, urinary frequency, abdominal bloating. She denies chills, nausea, vomiting, hematuria, vaginal discharge, vaginal bleeding, constipation, diarrhea. She has tried tylenol at home with some relief of pain. She was recently treated by GYN for vulvovaginitis with clotrimazole and fluconazole course and reports her vaginal symptoms have fully resolved. She was seen by GI for abdominal and epigastric pain earlier this year, She had a normal CT abdomen pelvis on 06/15/17 and is currently maintained on bentyl for IBS by GI provider but further GI f/u scheduled at this time  Patient Active Problem List   Diagnosis Date Noted  . Acne vulgaris 12/28/2016  . Bronchiectasis with acute exacerbation ([place]) 12/22/2016  . Medicare annual wellness visit, subsequent 04/28/2016  . Encounter for general adult medical examination with abnormal findings 04/28/2016  . Fibroids, intramural 04/01/2016  . Abnormal uterine bleeding (AUB) 04/01/2016  . Lung nodules 03/16/2016  . Palpitations   . GERD (gastroesophageal reflux disease) 07/30/2015  . Bronchiectasis without acute exacerbation ([place]) 07/08/2015  . COPD (chronic obstructive pulmonary disease) ([place]) 06/04/2015  . Fatigue 06/04/2015    Social History   Tobacco Use  . Smoking status: Never Smoker  . Smokeless tobacco: Never  Used  Substance Use Topics  . Alcohol use: No    Alcohol/week: 0.0 oz     Current Outpatient Medications:  .  albuterol (VENTOLIN HFA) 108 (90 Base) MCG/ACT inhaler, Inhale 2 puffs into the lungs every 6 (six) hours as needed for wheezing or shortness of breath., Disp: 3 Inhaler, Rfl: 3 .  clotrimazole (GYNE-LOTRIMIN) 1 % vaginal cream, Apply to affected areas twice a day, Disp: 60 g, Rfl: 2 .  cyclobenzaprine (FLEXERIL) 5 MG tablet, Take 1 tablet (5 mg total) by mouth at bedtime., Disp: 30 tablet, Rfl: 0 .  dicyclomine (BENTYL) 20 MG tablet, Take 1 tablet (20 mg total) by mouth daily as needed for spasms., Disp: 30 tablet, Rfl: 2 .  estradiol (CLIMARA) 0.05 mg/24hr patch, Place 1 patch (0.05 mg total) onto the skin once a week., Disp: 4 patch, Rfl: 12 .  fluconazole (DIFLUCAN) 150 MG tablet, repeat in 3 days, Disp: 2 tablet, Rfl: 1 .  ibuprofen (ADVIL,MOTRIN) 400 MG tablet, Take 1 tablet (400 mg total) by mouth every 6 (six) hours as needed., Disp: 90 tablet, Rfl: 0 .  metoprolol succinate (TOPROL-XL) 50 MG 24 hr tablet, Take 1 tablet (50mg ) in morning and 1/2 tablet (25mg ) in evening with or immediately following a meal. (Patient taking differently: Take 25-50 mg by mouth See admin instructions. 50 mg in the morning and 25 mg in the evening), Disp: 135 tablet, Rfl: 1 .  omeprazole (PRILOSEC) 40 MG capsule, TAKE 1 CAPSULE BY MOUTH EVERY DAY, Disp: 90 capsule, Rfl: 0 .  Respiratory Therapy Supplies (FLUTTER) DEVI, Use as directed., Disp: 1  each, Rfl: 0 .  RESTASIS 0.05 % ophthalmic emulsion, Instill 1 drop into both eyes two times a day, Disp: , Rfl: 2 .  SYMBICORT 160-4.5 MCG/ACT inhaler, INHALE 2 PUFFS BY MOUTH TWICE A DAY, Disp: 10.2 Inhaler, Rfl: 5  Current Facility-Administered Medications:  .  0.9 %  sodium chloride infusion, 500 mL, Intravenous, Once, Danis, Estill Cotta III, MD  No Known Allergies  ROS  No other specific complaints in a complete review of systems (except as listed in  HPI above).  Objective  Vitals:   08/16/17 1042  BP: 112/64  Pulse: 92  Resp: 16  Temp: 98.6 F (37 C)  TempSrc: Oral  SpO2: 98%  Weight: 182 lb (82.6 kg)  Height: 5\' 7"  (1.702 m)    Body mass index is 28.51 kg/m.  Nursing Note and Vital Signs reviewed.  Physical Exam  Constitutional: Patient appears well-developed and well-nourished. Obese  No distress.  HEENT: head atraumatic, normocephalic, pupils equal and reactive to light, EOM's intact, neck supple without lymphadenopathy, oropharynx pink and moist without exudate Cardiovascular: Normal rate, regular rhythm, S1/S2 present.  No murmur or rub heard. No BLE edema. Distal pulses intact. Pulmonary/Chest: Effort normal and breath sounds clear. No respiratory distress or retractions. Abdominal: Soft, rotund, bowel sounds present x4 quadrants.  Left lower and upper quadrant abdominal and CVA Tenderness. No rigidity, no masses. Neurological: She is alert and oriented to person, place, and time. No cranial nerve deficit. Coordination, balance, strength, speech and gait are normal.  Skin: Skin is warm and dry. No rash noted. No erythema.  Psychiatric: Patient has a normal mood and affect. behavior is normal. Judgment and thought content normal.  Results for orders placed or performed in visit on 08/16/17 (from the past 72 hour(s))  POCT urinalysis dipstick     Status: Abnormal   Collection Time: 08/16/17 10:58 AM  Result Value Ref Range   Color, UA pale yellow    Clarity, UA clear    Glucose, UA Negative Negative   Bilirubin, UA negative    Ketones, UA negative    Spec Grav, UA 1.015 1.010 - 1.025   Blood, UA negative    pH, UA 6.0 5.0 - 8.0   Protein, UA Negative Negative   Urobilinogen, UA negative (A) 0.2 or 1.0 E.U./dL   Nitrite, UA negative    Leukocytes, UA Negative Negative   Appearance     Odor      Assessment & Plan RTC in 3-4 weeks for F/U: abdominal pain- trying gluten free diet, probiotics, consider mood  screening  1. Dysuria, Urinary frequency Urinalysis, cbc, cmet normal in office today, culture sent  F/U with further recommendations pending lab results -Red flags and when to present for emergency care or RTC including fever >101.31F,  new/worsening/un-resolving symptoms,  reviewed with patient at time of visit. Follow up and care instructions discussed and provided in AVS. - POCT urinalysis dipstick - Urine Culture; Future - CBC; Future - Comprehensive metabolic panel; Future  3. Abdominal pain, unspecified abdominal location STAT testing ordered today due to acute symptoms and pain on PE- all testing returned normal, no further testing warranted at this time Per last GI visit, her abdominal bloating is not a new complaint, and there was also suspicion that her mood could play a role in her GI complaints We discussed trial of gluten free diet and probiotics today for her symptoms, additional information printed on AVS. We also discussed red flag and when to present for  emergency care or RTC. RTC in 3-4 weeks for follow up, could consider further evaluation of mood if symptoms persist - CBC; Future - Comprehensive metabolic panel; Future - Lipase; Future - DG Abd 2 Views; Future

## 2017-08-17 LAB — URINE CULTURE
MICRO NUMBER: 90934452
Result:: NO GROWTH
SPECIMEN QUALITY:: ADEQUATE

## 2017-08-18 ENCOUNTER — Other Ambulatory Visit: Payer: Self-pay | Admitting: *Deleted

## 2017-08-18 DIAGNOSIS — N951 Menopausal and female climacteric states: Secondary | ICD-10-CM

## 2017-08-18 MED ORDER — ESTRADIOL 0.05 MG/24HR TD PTWK: 0.0500 mg | MEDICATED_PATCH | TRANSDERMAL | 3 refills | Status: DC

## 2017-08-18 NOTE — Telephone Encounter (Signed)
Received a fax from [facility] asking for Estradiol to be approved to be dispensed as 90 day supply. Will forward to provider.

## 2017-08-21 DIAGNOSIS — L700 Acne vulgaris: Secondary | ICD-10-CM | POA: Diagnosis not present

## 2017-09-01 ENCOUNTER — Other Ambulatory Visit: Payer: Self-pay | Admitting: Gastroenterology

## 2017-09-04 ENCOUNTER — Ambulatory Visit: Payer: Self-pay | Admitting: Nurse Practitioner

## 2017-09-05 ENCOUNTER — Encounter: Payer: Self-pay | Admitting: Obstetrics & Gynecology

## 2017-09-05 ENCOUNTER — Ambulatory Visit: Payer: Medicare HMO | Admitting: Obstetrics & Gynecology

## 2017-09-05 VITALS — BP 115/68 | HR 91 | Wt 180.8 lb

## 2017-09-05 DIAGNOSIS — N951 Menopausal and female climacteric states: Secondary | ICD-10-CM | POA: Diagnosis not present

## 2017-09-05 DIAGNOSIS — Z79890 Hormone replacement therapy (postmenopausal): Secondary | ICD-10-CM | POA: Diagnosis not present

## 2017-09-05 DIAGNOSIS — Z5181 Encounter for therapeutic drug level monitoring: Secondary | ICD-10-CM | POA: Diagnosis not present

## 2017-09-05 NOTE — Patient Instructions (Signed)

## 2017-09-05 NOTE — Progress Notes (Signed)
History:  47 y.o. G1P1001 here today for surveillance of ERT. Pt was on tablets that were working well but, wanted to change to the patch for safety reasons. She reports difficulty in keeping the patch on. She denies other issues with the patch.   The following portions of the patient's history were reviewed and updated as appropriate: allergies, current medications, past family history, past medical history, past social history, past surgical history and problem list.  Review of Systems:  Pertinent items are noted in HPI.    Objective:  Physical Exam  BP 115/68   Pulse 91   Wt 180 lb 12.8 oz (82 kg)   LMP 03/31/2016   BMI 28.32 kg/m  Weight 180 lb 12.8 oz (82 kg), last menstrual period 03/31/2016.  CONSTITUTIONAL: Well-developed, well-nourished female in no acute distress.  HENT:  Normocephalic, atraumatic EYES: Conjunctivae and EOM are normal. No scleral icterus.  NECK: Normal range of motion SKIN: Skin is warm and dry. No rash noted. Not diaphoretic.No pallor. [place]: Alert and oriented to person, place, and time. Normal coordination.    Assessment & Plan:  ERT follow up  Will change pt to the Estradiol biweekly 0.05mg  TTS (meds called to pharmacy)  F/u in 3 months or sooner prn  Total face-to-face time with patient was 15 min.  Greater than 50% was spent in counseling and coordination of care with the patient.   Ayan Yankey L. Harraway-Smith, M.D., [place] June

## 2017-09-06 NOTE — Progress Notes (Signed)
@Patient  ID: Hannah Barron, female    DOB: [birth year], 47 y.o.   MRN: [id number]  Chief Complaint  Patient presents with  . Acute Visit    Referring provider: Lance Sell, NP  HPI: 47 year old female patient followed in our office for chronic obstructive lung disease, bronchiectasis (diagnosed with a VATS biopsy in 2003 by pulmonologist in [place] -cystic bronchiectasis), potential latent TB.  Patient is an immigrant from [place] in 1998 does not report any exposures to TB, patient does not recall getting BCG vaccine as a child.  Patient underwent bronchoscopy to evaluate for MAI infection and positive QuantiFERON gold results.  All results have been negative except for positive PJP on DFA.  PMH: GERD, anemia, migraines Smoker/ Smoking History: Never smoker Maintenance: Symbicort 160 Pt of: Dr. Vaughan Browner  Recent [facility] Encounters:   06/07/2017-[place]-office visit Patient has had stable symptoms since being treated in December/2018 for bronchiectasis exacerbation.  Chronic cough with white thick yellow mucus.  Chronic dyspnea on exertion.  She is continued on Symbicort.  Has been using flutter valve.  Patient just finished pulmonary rehab. Plan: Stay active, patient recently got a gym membership, follow-up in 6 months with a pulmonary function test, patient probably with latent TB, patient does not want INH therapy at this time  09/07/2017  - Visit   Patient presenting today for acute visit.  Patient reporting for the last 3 days she is had to use her rescue inhaler.  Patient has been adherent to using her Symbicort 160.  Patient reporting chest tightness, cough, for 4 days, unable to lay down, shortness of breath, sore throat.  Patient is febrile in office today with temp of 99.4  Patient reports no known sick contacts.  Patient has not used any over-the-counter regimens to help manage her symptoms.  Patient has been adherent to using her flutter valve.  Patient says she  uses it about 3-4 times a day with 2 to 3 breaths each time.   Tests:   A1AT 03/16/16- 161, PIMM Quantiferon 03/16/16- Positive CF panel 03/25/15- negative for 97 mutations analyzed (report scanned)  Labs 03/24/16 IgG 1477 IgA 134 IgM 168 IgE 6 ANA, CCP, RA- negative  HIV 04/01/16- Negative Beta D glucan 04/01/16- Negative  CT high resolution 07/16/15-areas of cystic and varicose bronchiectasis, extensive air trapping, multiple pulmonary nodules. CT chest 03/21/16-areas of bronchiectasis, air trapping and pulmonary nodules. Unchanged compared to 2017 CT angiogram 12/23/16-stable areas of bronchiectasis, air trapping and pulmonary nodules.  No pulmonary embolism.  Myocardial perfusion study 12/2015 >neg  Ischemia , EF 55% Echo 10/2015 >EF 65%.   PFT 06/2015  FVC 2.45 (70%) FEV1 1.54 [56%), F/F 63, TLC 78% Moderate obstructive airway disease, minimal restriction. DLCO could not be completed  03/16/16 FVC 2.29 (69%), FEV1 1.46 (54%), F/F 64   Bronchoscopy 03/29/16 Cultures, AFB, fungal cultures-negative to date PJP DFA-positive Cytology-no malignant cells, CD4: CD8 ratio-1.62 Cell count WBC-26, 26% lymphs, 50% neutrophils, 24% monocyte macrophage     Chart Review:      No Known Allergies  Immunization History  Administered Date(s) Administered  . Influenza,inj,Quad PF,6+ Mos 11/07/2015, 10/26/2016  . Pneumococcal Polysaccharide-23 11/07/2015    Past Medical History:  Diagnosis Date  . Anemia   . Bronchiectasis ([place])   . COPD (chronic obstructive pulmonary disease) ([place])   . GERD (gastroesophageal reflux disease)   . Migraines   . Paroxysmal SVT (supraventricular tachycardia) ([place])    a. diagnosed in 11/2015.  . Right leg numbness  Tobacco History: Social History   Tobacco Use  Smoking Status Never Smoker  Smokeless Tobacco Never Used   Counseling given: Not Answered Continue not smoking  Outpatient Encounter Medications as of 09/07/2017  Medication Sig    . albuterol (VENTOLIN HFA) 108 (90 Base) MCG/ACT inhaler Inhale 2 puffs into the lungs every 6 (six) hours as needed for wheezing or shortness of breath.  Marland Kitchen amoxicillin-clavulanate (AUGMENTIN) 875-125 MG tablet Take 1 tablet by mouth 2 (two) times daily.  . clotrimazole (GYNE-LOTRIMIN) 1 % vaginal cream Apply to affected areas twice a day  . dicyclomine (BENTYL) 20 MG tablet TAKE 1 TABLET BY MOUTH DAILY AS NEEDED FOR SPASMS.  Marland Kitchen ibuprofen (ADVIL,MOTRIN) 400 MG tablet Take 1 tablet (400 mg total) by mouth every 6 (six) hours as needed.  . metoprolol succinate (TOPROL-XL) 50 MG 24 hr tablet Take 1 tablet (50mg ) in morning and 1/2 tablet (25mg ) in evening with or immediately following a meal. (Patient taking differently: Take 25-50 mg by mouth See admin instructions. 50 mg in the morning and 25 mg in the evening)  . omeprazole (PRILOSEC) 40 MG capsule TAKE 1 CAPSULE BY MOUTH EVERY DAY  . predniSONE (DELTASONE) 10 MG tablet 4 tabs for 2 days, then 3 tabs for 2 days, 2 tabs for 2 days, then 1 tab for 2 days, then stop  . Respiratory Therapy Supplies (FLUTTER) DEVI Use as directed.  . RESTASIS 0.05 % ophthalmic emulsion Instill 1 drop into both eyes two times a day  . SYMBICORT 160-4.5 MCG/ACT inhaler INHALE 2 PUFFS BY MOUTH TWICE A DAY   No facility-administered encounter medications on file as of 09/07/2017.      Review of Systems  Review of Systems  Constitutional: Positive for fatigue and fever. Negative for chills and unexpected weight change.  HENT: Positive for congestion and sore throat. Negative for ear pain, postnasal drip, rhinorrhea, sinus pressure and sinus pain.   Respiratory: Positive for cough (Dark yellow mucus), chest tightness and shortness of breath. Negative for wheezing.   Cardiovascular: Negative for chest pain and palpitations.  Gastrointestinal: Negative for diarrhea, nausea and vomiting.  Genitourinary: Negative for dysuria, frequency and urgency.  Musculoskeletal:  Negative for arthralgias.  Skin: Negative for color change.  Allergic/Immunologic: Negative for environmental allergies and food allergies.  Neurological: Negative for dizziness, light-headedness and headaches.  Psychiatric/Behavioral: Negative for dysphoric mood. The patient is not nervous/anxious.     Physical Exam  BP 104/62 (BP Location: Left Arm, Cuff Size: Normal)   Pulse (!) 101   Temp 99.4 F (37.4 C) (Oral)   Ht 5' 6.5" (1.689 m)   Wt 180 lb 12.8 oz (82 kg)   LMP 03/31/2016   SpO2 98%   BMI 28.74 kg/m   Wt Readings from Last 5 Encounters:  09/07/17 180 lb 12.8 oz (82 kg)  09/05/17 180 lb 12.8 oz (82 kg)  08/16/17 182 lb (82.6 kg)  07/25/17 178 lb 11.2 oz (81.1 kg)  06/23/17 181 lb (82.1 kg)     Physical Exam  Constitutional: She is oriented to person, place, and time and well-developed, well-nourished, and in no distress. No distress.  HENT:  Head: Normocephalic and atraumatic.  Right Ear: Hearing and external ear normal.  Left Ear: Hearing, tympanic membrane and external ear normal.  Nose: Nose normal. Right sinus exhibits no maxillary sinus tenderness and no frontal sinus tenderness. Left sinus exhibits no maxillary sinus tenderness and no frontal sinus tenderness.  Mouth/Throat: Uvula is midline and oropharynx is  clear and moist. No oropharyngeal exudate.  Cerumen bilaterally.  Completely occluded right ear canal.  Left ear canal 75%.  Left ear canal TM with effusion but without infection.  Eyes: Pupils are equal, round, and reactive to light.  Neck: Normal range of motion. Neck supple. No JVD present.  Cardiovascular: Normal rate, regular rhythm and normal heart sounds.  Pulmonary/Chest: Effort normal and breath sounds normal. No accessory muscle usage. No respiratory distress. She has no decreased breath sounds. She has no wheezes. She has no rhonchi.  Abdominal: Soft. Bowel sounds are normal. There is no tenderness.  Musculoskeletal: Normal range of motion.  She exhibits no edema.  Lymphadenopathy:       Head (right side): Submandibular adenopathy present.       Head (left side): Submandibular adenopathy present.    She has cervical adenopathy.       Right cervical: Deep cervical adenopathy present.       Left cervical: Deep cervical adenopathy present.  Neurological: She is alert and oriented to person, place, and time. Gait normal.  Skin: Skin is warm and dry. She is not diaphoretic. No erythema.  Psychiatric: Mood, memory, affect and judgment normal.  Nursing note and vitals reviewed.     Lab Results:  CBC    Component Value Date/Time   WBC 5.1 08/16/2017 1121   RBC 4.08 08/16/2017 1121   HGB 13.1 08/16/2017 1121   HGB 12.8 08/24/2016 1616   HCT 38.9 08/16/2017 1121   HCT 38.2 08/24/2016 1616   PLT 264.0 08/16/2017 1121   PLT 325 08/24/2016 1616   MCV 95.4 08/16/2017 1121   MCV 91 08/24/2016 1616   MCH 30.3 09/12/2016 1446   MCHC 33.7 08/16/2017 1121   RDW 13.0 08/16/2017 1121   RDW 14.2 08/24/2016 1616   LYMPHSABS 1.7 05/03/2017 1034   MONOABS 0.4 05/03/2017 1034   EOSABS 0.1 05/03/2017 1034   BASOSABS 0.0 05/03/2017 1034    BMET    Component Value Date/Time   NA 138 08/16/2017 1121   NA 140 08/24/2016 1616   K 3.7 08/16/2017 1121   CL 104 08/16/2017 1121   CO2 29 08/16/2017 1121   GLUCOSE 116 (H) 08/16/2017 1121   BUN 7 08/16/2017 1121   BUN 10 08/24/2016 1616   CREATININE 0.74 08/16/2017 1121   CALCIUM 9.4 08/16/2017 1121   GFRNONAA >60 09/12/2016 1446   GFRAA >60 09/12/2016 1446    BNP No results found for: BNP  ProBNP No results found for: PROBNP  Imaging: Dg Abd 2 Views  Result Date: 08/16/2017 CLINICAL DATA:  Left abdominal pain and bloating over the last several days. EXAM: ABDOMEN - 2 VIEW COMPARISON:  CT 06/15/2017. FINDINGS: The bowel gas pattern is normal. Normal amount of fecal matter. There is no evidence of free air. No radio-opaque calculi or other significant radiographic abnormality is  seen. IMPRESSION: Negative. Electronically Signed   By: Nelson Chimes M.D.   On: 08/16/2017 11:44       Assessment & Plan:   Pleasant 47 year old patient seen today in office visit.  Patient with acute exacerbation of her bronchiectasis.  Will treat with Augmentin for 10 days.  We will also treat with short taper of prednisone.  Chest x-ray today.  Will provide samples of Delsym over-the-counter cough medicine as well as Mucinex DM for patient to use.  Encouraged patient to follow-up with our office if her symptoms are not improving.  We will bring patient back in 4 to 6  weeks to ensure the bronchiectasis exacerbation is resolved.  Can consider flu vaccine at that time.  Bronchiectasis with acute exacerbation (HCC) Chest Xray today   Augmentin >>> Take 1 875-125 mg tablet every 12 hours for the next 10 days >>> Take with food  Prednisone 10mg  tablet  >>>4 tabs for 2 days, then 3 tabs for 2 days, 2 tabs for 2 days, then 1 tab for 2 days, then stop >>>take with food in the morning   Continue Symbicort 160 >>> 2 puffs in the morning right when you wake up, rinse out your mouth after use, 12 hours later 2 puffs, rinse after use >>> Take this daily, no matter what >>> This is not a rescue inhaler   Bronchiectasis: This is the medical term which indicates that you have damage, dilated airways making you more susceptible to respiratory infection. Use a flutter valve 10 breaths twice a day or 4 to 5 breaths 4-5 times a day to help clear mucus out Let us know if you have cough with change in mucus color or fevers or chills.  At that point you would need an antibiotic. Maintain a healthy nutritious diet, eating whole foods Take your medications as prescribed   Can take Tylenol or ibuprofen which is over-the-counter for fever  We will provide samples of Mucinex DM which she can use >>> Can take 2 tablets daily  We will provide samples of Delsym over-the-counter cough medicine which she can  use at night    We will see patient back in 4 to 6 weeks to ensure she is better from this bronchiectasis exacerbation     Lauraine Rinne, NP 09/07/2017

## 2017-09-07 ENCOUNTER — Ambulatory Visit (INDEPENDENT_AMBULATORY_CARE_PROVIDER_SITE_OTHER)
Admission: RE | Admit: 2017-09-07 | Discharge: 2017-09-07 | Disposition: A | Discharge status: Home / Self Care | Payer: Medicare HMO | Source: Ambulatory Visit | Attending: Pulmonary Disease | Admitting: Pulmonary Disease

## 2017-09-07 ENCOUNTER — Ambulatory Visit: Payer: Medicare HMO | Admitting: Pulmonary Disease

## 2017-09-07 ENCOUNTER — Encounter: Payer: Self-pay | Admitting: Pulmonary Disease

## 2017-09-07 VITALS — BP 104/62 | HR 101 | Temp 99.4°F | Ht 66.5 in | Wt 180.8 lb

## 2017-09-07 DIAGNOSIS — H6121 Impacted cerumen, right ear: Secondary | ICD-10-CM

## 2017-09-07 DIAGNOSIS — J471 Bronchiectasis with (acute) exacerbation: Secondary | ICD-10-CM

## 2017-09-07 DIAGNOSIS — R079 Chest pain, unspecified: Secondary | ICD-10-CM | POA: Diagnosis not present

## 2017-09-07 DIAGNOSIS — R05 Cough: Secondary | ICD-10-CM | POA: Diagnosis not present

## 2017-09-07 DIAGNOSIS — H6120 Impacted cerumen, unspecified ear: Secondary | ICD-10-CM | POA: Insufficient documentation

## 2017-09-07 IMAGING — DX DG CHEST 2V
2 series · 2 of 2 positions shown · non-contrast
Comparison: [DATE]

CLINICAL DATA: Productive cough and some chest pain for 4 days,
history of bronchiectasis, COPD, paroxysmal SVT

EXAM:
CHEST - 2 VIEW

[chest pa]
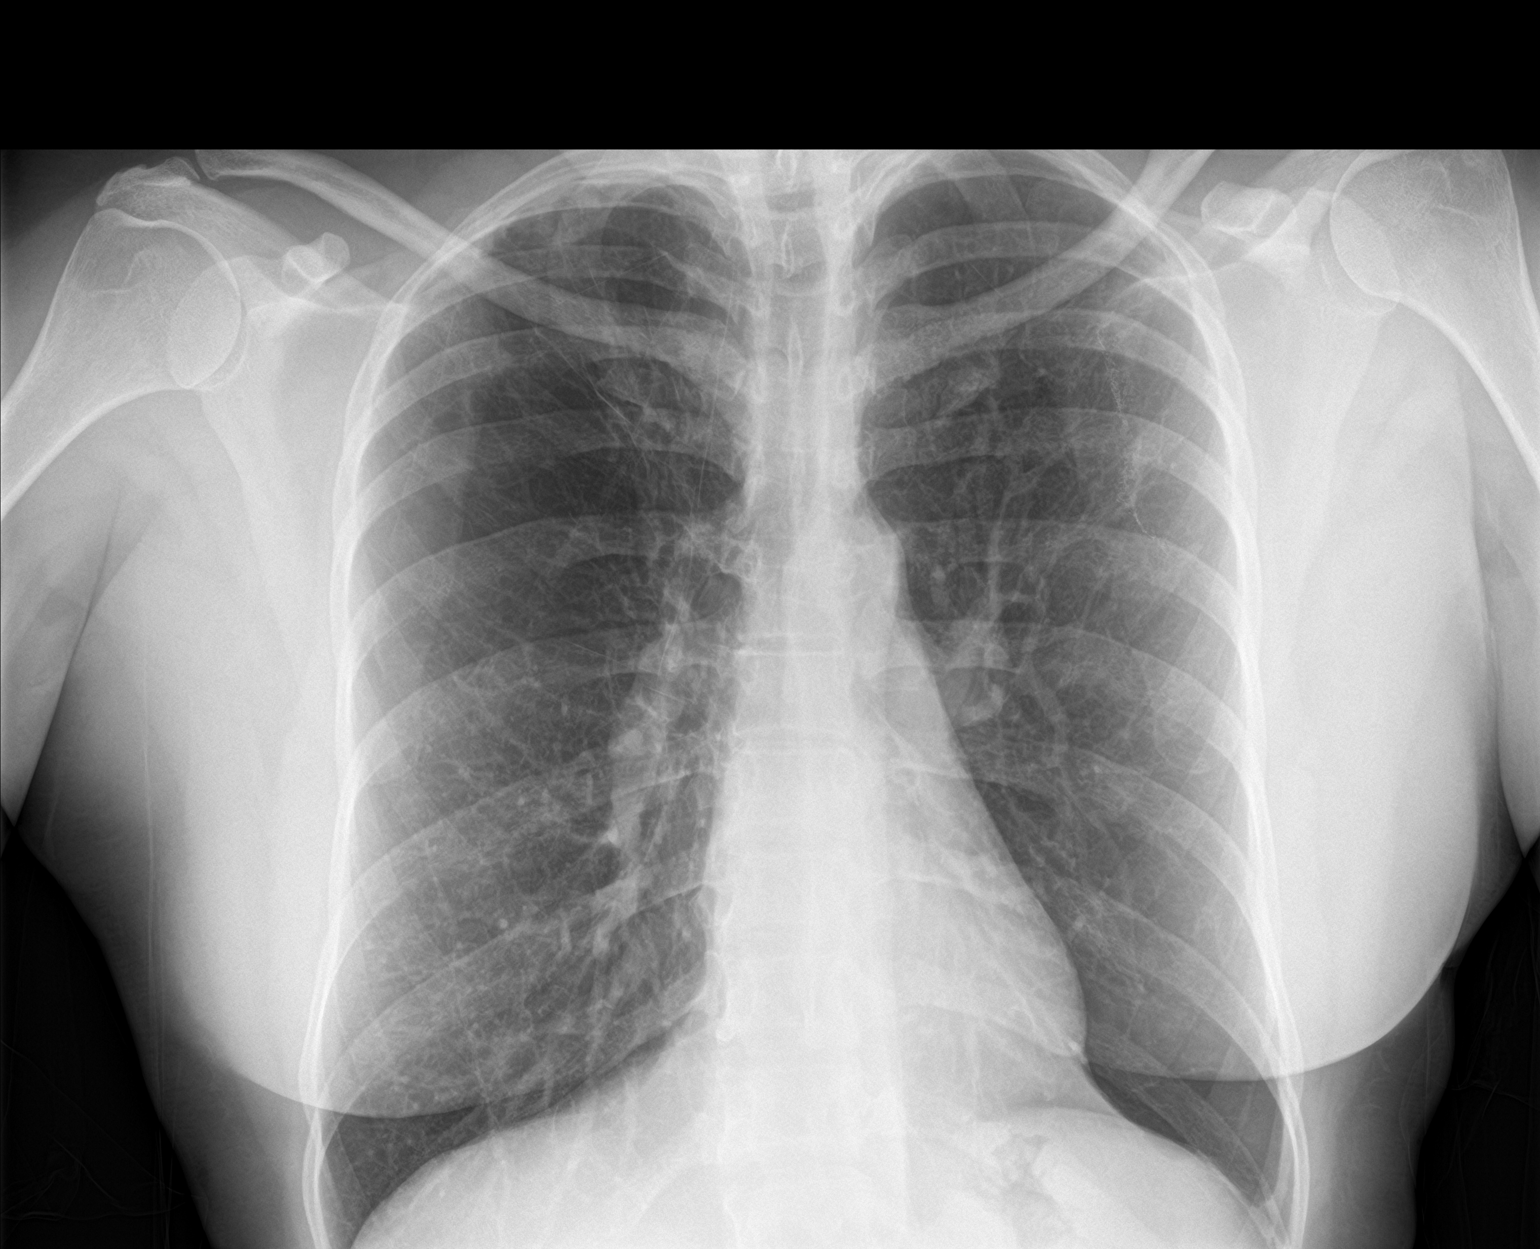

[chest lat]
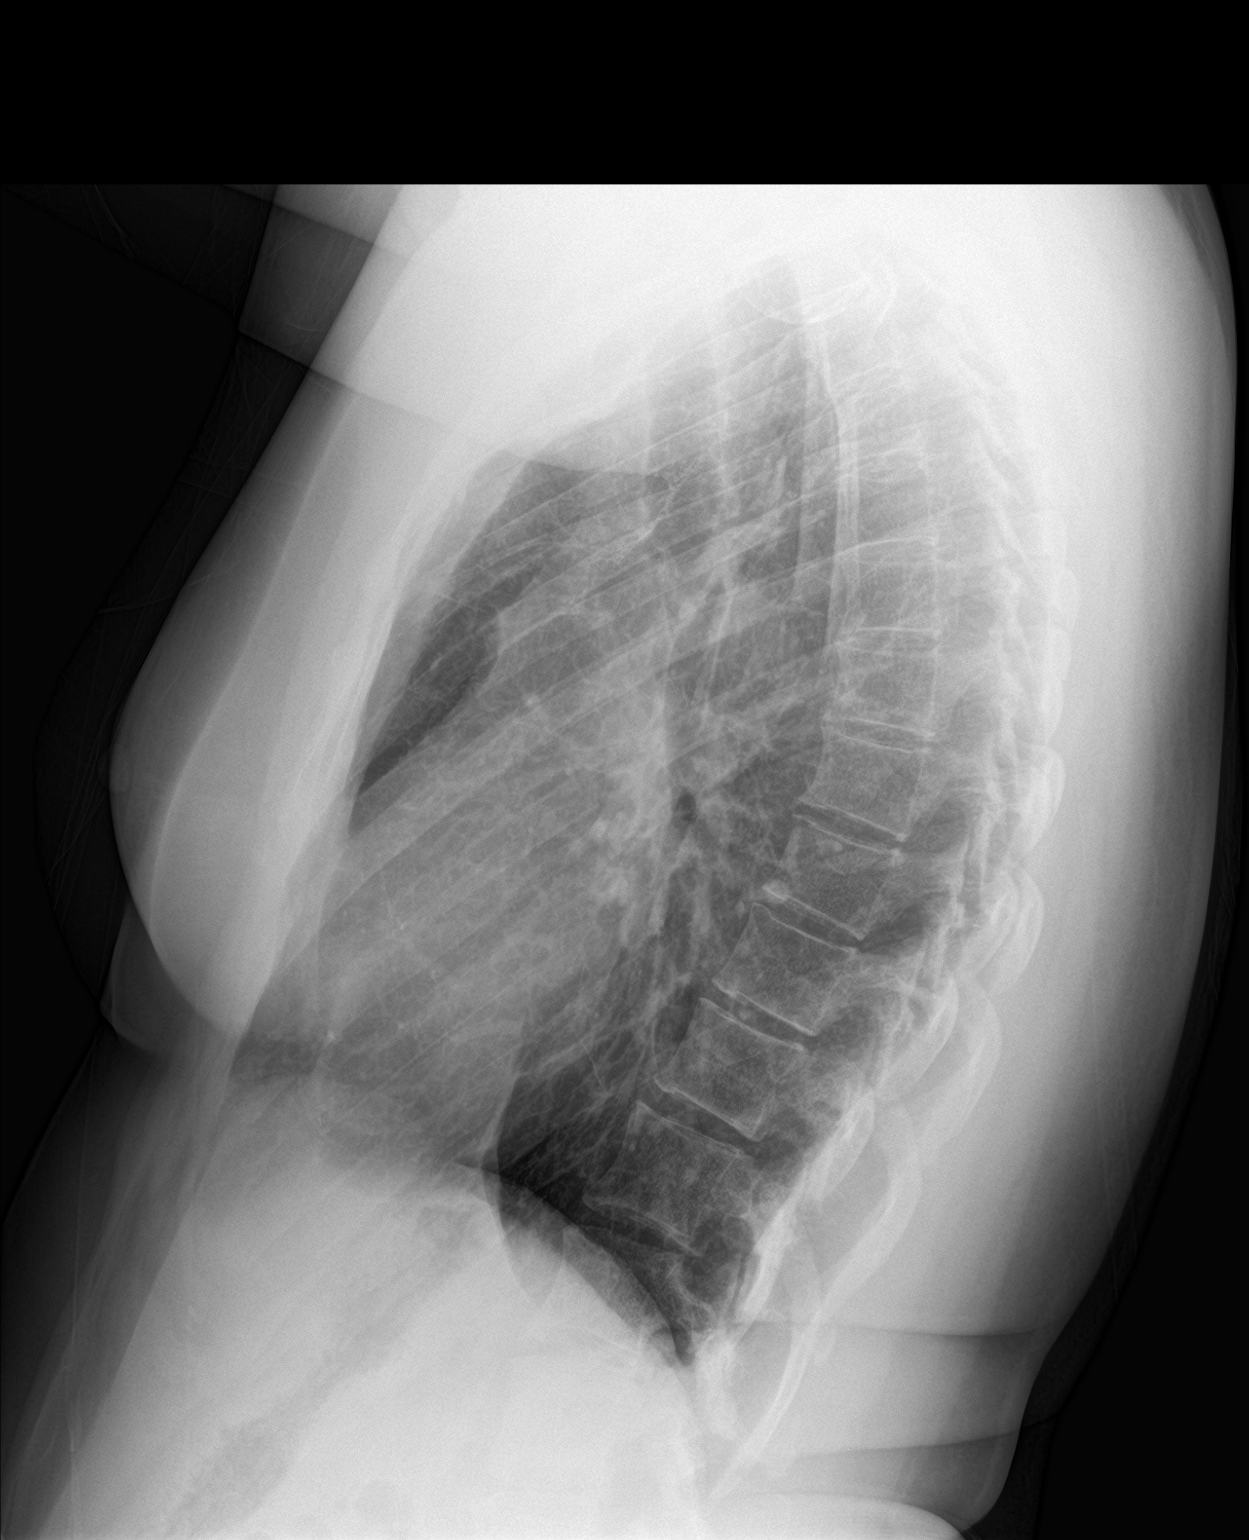

[2 of 2 positions shown; findings below may reference images not displayed]

FINDINGS: Normal heart size, mediastinal contours, and pulmonary vascularity.

Emphysematous changes with postsurgical changes in LEFT upper lobe
and RIGHT apical scarring.

No acute infiltrate, pleural effusion, or pneumothorax.

Bones unremarkable.
IMPRESSION: Emphysematous changes with RIGHT upper lobe scarring and
postsurgical changes in LEFT upper lobe.

No acute abnormalities.

## 2017-09-07 MED ORDER — PREDNISONE 10 MG PO TABS: ORAL_TABLET | ORAL | 0 refills | Status: DC

## 2017-09-07 MED ORDER — AMOXICILLIN-POT CLAVULANATE 875-125 MG PO TABS: 1.0000 | ORAL_TABLET | Freq: Two times a day (BID) | ORAL | 0 refills | Status: DC

## 2017-09-07 MED ORDER — BUDESONIDE-FORMOTEROL FUMARATE 160-4.5 MCG/ACT IN AERO: 2.0000 | INHALATION_SPRAY | Freq: Two times a day (BID) | RESPIRATORY_TRACT | 0 refills | Status: DC

## 2017-09-07 NOTE — Progress Notes (Signed)
Your chest x-ray results of come back.  Showing no acute changes.  No plan of care changes at this time.  Keep follow-up appointment.    Follow-up with our office if symptoms worsen or you do not feel like you are improving under her current regimen.  It was a pleasure taking care of you,  Brian Mack, FNP 

## 2017-09-07 NOTE — Assessment & Plan Note (Signed)
Chest Xray today   Augmentin >>> Take 1 875-125 mg tablet every 12 hours for the next 10 days >>> Take with food  Prednisone 10mg  tablet  >>>4 tabs for 2 days, then 3 tabs for 2 days, 2 tabs for 2 days, then 1 tab for 2 days, then stop >>>take with food in the morning   Continue Symbicort 160 >>> 2 puffs in the morning right when you wake up, rinse out your mouth after use, 12 hours later 2 puffs, rinse after use >>> Take this daily, no matter what >>> This is not a rescue inhaler   Bronchiectasis: This is the medical term which indicates that you have damage, dilated airways making you more susceptible to respiratory infection. Use a flutter valve 10 breaths twice a day or 4 to 5 breaths 4-5 times a day to help clear mucus out Let us know if you have cough with change in mucus color or fevers or chills.  At that point you would need an antibiotic. Maintain a healthy nutritious diet, eating whole foods Take your medications as prescribed   Can take Tylenol or ibuprofen which is over-the-counter for fever  We will provide samples of Mucinex DM which she can use >>> Can take 2 tablets daily  We will provide samples of Delsym over-the-counter cough medicine which she can use at night    We will see patient back in 4 to 6 weeks to ensure she is better from this bronchiectasis exacerbation

## 2017-09-07 NOTE — Addendum Note (Signed)
Addended by: Amado Coe on: 09/07/2017 10:36 AM   Modules accepted: Orders

## 2017-09-07 NOTE — Assessment & Plan Note (Signed)
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

## 2017-09-07 NOTE — Patient Instructions (Addendum)
Chest Xray today   Augmentin >>> Take 1 875-125 mg tablet every 12 hours for the next 10 days >>> Take with food  Prednisone 10mg  tablet  >>>4 tabs for 2 days, then 3 tabs for 2 days, 2 tabs for 2 days, then 1 tab for 2 days, then stop >>>take with food in the morning   Continue Symbicort 160 >>> 2 puffs in the morning right when you wake up, rinse out your mouth after use, 12 hours later 2 puffs, rinse after use >>> Take this daily, no matter what >>> This is not a rescue inhaler   Bronchiectasis: This is the medical term which indicates that you have damage, dilated airways making you more susceptible to respiratory infection. Use a flutter valve 10 breaths twice a day or 4 to 5 breaths 4-5 times a day to help clear mucus out Let us know if you have cough with change in mucus color or fevers or chills.  At that point you would need an antibiotic. Maintain a healthy nutritious diet, eating whole foods Take your medications as prescribed   Can take Tylenol or ibuprofen which is over-the-counter for fever  We will provide samples of Mucinex DM which she can use >>> Can take 2 tablets daily  We will provide samples of Delsym over-the-counter cough medicine which she can use at night    We will see patient back in 4 to 6 weeks to ensure she is better from this bronchiectasis exacerbation   Please contact the office if your symptoms worsen or you have concerns that you are not improving.   Thank you for choosing [facility] for your healthcare, and for allowing [place] to partner with you on your healthcare journey. I am thankful to be able to provide care to you today.   [name] FNP-C

## 2017-09-18 ENCOUNTER — Other Ambulatory Visit: Payer: Self-pay | Admitting: Nurse Practitioner

## 2017-09-18 DIAGNOSIS — K2100 Gastro-esophageal reflux disease with esophagitis, without bleeding: Secondary | ICD-10-CM

## 2017-09-20 ENCOUNTER — Encounter: Payer: Self-pay | Admitting: Nurse Practitioner

## 2017-09-20 ENCOUNTER — Ambulatory Visit (INDEPENDENT_AMBULATORY_CARE_PROVIDER_SITE_OTHER): Payer: Medicare HMO | Admitting: Nurse Practitioner

## 2017-09-20 ENCOUNTER — Telehealth: Payer: Self-pay | Admitting: *Deleted

## 2017-09-20 VITALS — BP 128/80 | HR 108 | Ht 66.5 in | Wt 184.0 lb

## 2017-09-20 DIAGNOSIS — R109 Unspecified abdominal pain: Secondary | ICD-10-CM

## 2017-09-20 DIAGNOSIS — F419 Anxiety disorder, unspecified: Secondary | ICD-10-CM | POA: Diagnosis not present

## 2017-09-20 DIAGNOSIS — R69 Illness, unspecified: Secondary | ICD-10-CM | POA: Diagnosis not present

## 2017-09-20 DIAGNOSIS — Z23 Encounter for immunization: Secondary | ICD-10-CM | POA: Diagnosis not present

## 2017-09-20 MED ORDER — ESTRADIOL 0.05 MG/24HR TD PTWK: MEDICATED_PATCH | TRANSDERMAL | 12 refills | Status: DC

## 2017-09-20 MED ORDER — SERTRALINE HCL 50 MG PO TABS: 50.0000 mg | ORAL_TABLET | Freq: Every day | ORAL | 1 refills | Status: DC

## 2017-09-20 NOTE — Progress Notes (Signed)
Name: Hannah Barron   MRN: [id number]    DOB: 1970-06-10   Date:09/20/2017       Progress Note  Subjective  Chief Complaint Follow up   HPI Here today for follow up of abdominal pain, last seen for same on 08/16/17, instructed to try gluten free diet and probiotics after normal CBC, CMET, lipase, abdominal xray were done at her 08/16/17 OV. Urine testing including urine culture were also normal. She also had imaging and GI eval earlier this year for her abdominal pain which were unremarkable. She tells me today she has tried to follow the gluten free diet, stopped rice, and has started  The probiotic, but she continues to have intermittent left sided abdominal pain most days, seems to occur at night when she is lying down to sleep. She reports sometimes she has trouble sleeping because she is worried about the pain and other medical problems. She occasionally feels constipated. She denies fevers, chills, weakness, syncope, anorexia, nausea, vomiting, diarrhea, depression.   Patient Active Problem List   Diagnosis Date Noted  . Cerumen impaction 09/07/2017  . Acne vulgaris 12/28/2016  . Bronchiectasis with acute exacerbation ([place]) 12/22/2016  . Medicare annual wellness visit, subsequent 04/28/2016  . Encounter for general adult medical examination with abnormal findings 04/28/2016  . Fibroids, intramural 04/01/2016  . Abnormal uterine bleeding (AUB) 04/01/2016  . Lung nodules 03/16/2016  . Palpitations   . GERD (gastroesophageal reflux disease) 07/30/2015  . Bronchiectasis without acute exacerbation ([place]) 07/08/2015  . COPD (chronic obstructive pulmonary disease) ([place]) 06/04/2015  . Fatigue 06/04/2015    Past Surgical History:  Procedure Laterality Date  . CESAREAN SECTION    . CYSTOSCOPY N/A 04/05/2016   Procedure: CYSTOSCOPY;  Surgeon: Lavonia Drafts, MD;  Location: [facility];  Service: Gynecology;  Laterality: N/A;  . LAPAROSCOPIC VAGINAL HYSTERECTOMY WITH SALPINGECTOMY Bilateral  04/05/2016   Procedure: LAPAROSCOPIC ASSISTED VAGINAL HYSTERECTOMY WITH SALPINGECTOMY;  Surgeon: Lavonia Drafts, MD;  Location: [facility];  Service: Gynecology;  Laterality: Bilateral;  . LUNG BIOPSY    . VIDEO BRONCHOSCOPY Bilateral 03/29/2016   Procedure: VIDEO BRONCHOSCOPY WITHOUT FLUORO;  Surgeon: Marshell Garfinkel, MD;  Location: [facility];  Service: Cardiopulmonary;  Laterality: Bilateral;    Family History  Problem Relation Age of Onset  . Healthy Mother   . Healthy Father     Social History   Socioeconomic History  . Marital status: Married    Spouse name: Not on file  . Number of children: 1  . Years of education: 4  . Highest education level: Not on file  Occupational History  . Occupation: disability  Social Needs  . Financial resource strain: Not hard at all  . Food insecurity:    Worry: Never true    Inability: Never true  . Transportation needs:    Medical: No    Non-medical: No  Tobacco Use  . Smoking status: Never Smoker  . Smokeless tobacco: Never Used  Substance and Sexual Activity  . Alcohol use: No    Alcohol/week: 0.0 standard drinks  . Drug use: No  . Sexual activity: Yes    Birth control/protection: Surgical  Lifestyle  . Physical activity:    Days per week: 0 days    Minutes per session: 0 min  . Stress: To some extent  Relationships  . Social connections:    Talks on phone: More than three times a week    Gets together: More than three times a week    Attends religious service:  More than 4 times per year    Active member of club or organization: Yes    Attends meetings of clubs or organizations: More than 4 times per year    Relationship status: Married  . Intimate partner violence:    Fear of current or ex partner: No    Emotionally abused: No    Physically abused: No    Forced sexual activity: No  Other Topics Concern  . Not on file  Social History Narrative   Fun: Watch TV     Current Outpatient Medications:  .   albuterol (VENTOLIN HFA) 108 (90 Base) MCG/ACT inhaler, Inhale 2 puffs into the lungs every 6 (six) hours as needed for wheezing or shortness of breath., Disp: 3 Inhaler, Rfl: 3 .  budesonide-formoterol (SYMBICORT) 160-4.5 MCG/ACT inhaler, Inhale 2 puffs into the lungs 2 (two) times daily., Disp: 1 Inhaler, Rfl: 0 .  clotrimazole (GYNE-LOTRIMIN) 1 % vaginal cream, Apply to affected areas twice a day, Disp: 60 g, Rfl: 2 .  dicyclomine (BENTYL) 20 MG tablet, TAKE 1 TABLET BY MOUTH DAILY AS NEEDED FOR SPASMS., Disp: 90 tablet, Rfl: 0 .  ibuprofen (ADVIL,MOTRIN) 400 MG tablet, Take 1 tablet (400 mg total) by mouth every 6 (six) hours as needed., Disp: 90 tablet, Rfl: 0 .  metoprolol succinate (TOPROL-XL) 50 MG 24 hr tablet, Take 1 tablet (50mg ) in morning and 1/2 tablet (25mg ) in evening with or immediately following a meal. (Patient taking differently: Take 25-50 mg by mouth See admin instructions. 50 mg in the morning and 25 mg in the evening), Disp: 135 tablet, Rfl: 1 .  omeprazole (PRILOSEC) 40 MG capsule, TAKE 1 CAPSULE BY MOUTH EVERY DAY, Disp: 90 capsule, Rfl: 0 .  Respiratory Therapy Supplies (FLUTTER) DEVI, Use as directed., Disp: 1 each, Rfl: 0 .  RESTASIS 0.05 % ophthalmic emulsion, Instill 1 drop into both eyes two times a day, Disp: , Rfl: 2 .  SYMBICORT 160-4.5 MCG/ACT inhaler, INHALE 2 PUFFS BY MOUTH TWICE A DAY, Disp: 10.2 Inhaler, Rfl: 5  No Known Allergies   ROS See HPI  Objective  Vitals:   09/20/17 1401  BP: 128/80  Pulse: (!) 108  SpO2: 97%  Weight: 184 lb (83.5 kg)  Height: 5' 6.5" (1.689 m)   Body mass index is 29.25 kg/m.  Physical Exam Vital signs reviewed. Constitutional: Patient appears well-developed and well-nourished. No distress.  HENT: Head: Normocephalic and atraumatic.  Nose: Nose normal. Mouth/Throat: Oropharynx is clear and moist. No oropharyngeal exudate.  Eyes: Conjunctivae and EOM are normal. Pupils are equal, round, and reactive to light. No  scleral icterus.  Neck: Normal range of motion. Neck supple.  Cardiovascular: Normal rate, regular rhythm and normal heart sounds.  No murmur heard. No BLE edema. Distal pulses intact. Pulmonary/Chest: Effort normal and breath sounds normal. No respiratory distress. Abdominal: Soft. Bowel sounds are normal, no distension. No masses. Neurological: She is alert and oriented to person, place, and time. No cranial nerve deficit. Coordination, balance, strength, speech and gait are normal.  Skin: Skin is warm and dry. No rash noted. No erythema.  Psychiatric: Patient has a normal mood and affect. behavior is normal. Judgment and thought content normal.    Assessment & Plan RTC in 1 month for F/U: abdominal pain, anxiety- starting zoloft  -Reviewed Health Maintenance: Need for influenza vaccination- Flu Vaccine QUAD 36+ mos IM  Abdominal pain, unspecified abdominal location Extensive diagnostic workup unremarkable to this point Per last GI note there is question  of her mood playing a role in her abdominal complaints We discussed trial of SSRI today to see if this improves her abdominal pain, she is agreeable Additional education provided on AVS  Anxiety Start zoloft trial-dosing and side effects discussed RTC in 1 month for F/U - sertraline (ZOLOFT) 50 MG tablet; Take 1 tablet (50 mg total) by mouth daily.  Dispense: 30 tablet; Refill: 1

## 2017-09-20 NOTE — Telephone Encounter (Signed)
Received call from [place] (pharmacist) @ [facility] requesting clarification of Rx for Estradiol patch. She stated the Rx was prescribed as once per week and pt stated that she uses it twice per week. Per visit note from Dr. Ihor Dow on 8/27, pt should use Estradiol patch 0.05mg  twice weekly. Rx e-prescribed.

## 2017-09-20 NOTE — Patient Instructions (Signed)
Lets try zoloft 50mg  daily to see if this helps your symptoms.  Please start 1/2 tablet once daily for 1 week and then increase to a full tablet once daily on week two as tolerated.  Some side effects such as nausea, drowsiness and weight gain can occur.  Also rarely people have experienced suicidal thoughts when taking this medication.  Please discontinue the medication and go directly to ED if this occurs.  Id like to see you back in about 1 month to evaluate progress.     Abdominal Pain, Adult Many things can cause belly (abdominal) pain. Most times, belly pain is not dangerous. Many cases of belly pain can be watched and treated at home. Sometimes belly pain is serious, though. Your doctor will try to find the cause of your belly pain. Follow these instructions at home:  Take over-the-counter and prescription medicines only as told by your doctor. Do not take medicines that help you poop (laxatives) unless told to by your doctor.  Drink enough fluid to keep your pee (urine) clear or pale yellow.  Watch your belly pain for any changes.  Keep all follow-up visits as told by your doctor. This is important. Contact a doctor if:  Your belly pain changes or gets worse.  You are not hungry, or you lose weight without trying.  You are having trouble pooping (constipated) or have watery poop (diarrhea) for more than 2-3 days.  You have pain when you pee or poop.  Your belly pain wakes you up at night.  Your pain gets worse with meals, after eating, or with certain foods.  You are throwing up and cannot keep anything down.  You have a fever. Get help right away if:  Your pain does not go away as soon as your doctor says it should.  You cannot stop throwing up.  Your pain is only in areas of your belly, such as the right side or the left lower part of the belly.  You have bloody or black poop, or poop that looks like tar.  You have very bad pain, cramping, or bloating in your  belly.  You have signs of not having enough fluid or water in your body (dehydration), such as: ? Dark pee, very little pee, or no pee. ? Cracked lips. ? Dry mouth. ? Sunken eyes. ? Sleepiness. ? Weakness. This information is not intended to replace advice given to you by your health care provider. Make sure you discuss any questions you have with your health care provider. Document Released: 06/15/2007 Document Revised: 07/17/2015 Document Reviewed: 06/10/2015 Elsevier Interactive Patient Education  2018 [employer].

## 2017-09-21 ENCOUNTER — Ambulatory Visit: Payer: Medicare HMO | Admitting: Physician Assistant

## 2017-09-21 ENCOUNTER — Encounter: Payer: Self-pay | Admitting: Physician Assistant

## 2017-09-21 VITALS — BP 116/68 | HR 77 | Ht 66.0 in | Wt 183.0 lb

## 2017-09-21 DIAGNOSIS — I471 Supraventricular tachycardia: Secondary | ICD-10-CM | POA: Diagnosis not present

## 2017-09-21 DIAGNOSIS — R002 Palpitations: Secondary | ICD-10-CM

## 2017-09-21 DIAGNOSIS — L700 Acne vulgaris: Secondary | ICD-10-CM | POA: Diagnosis not present

## 2017-09-21 DIAGNOSIS — Z79899 Other long term (current) drug therapy: Secondary | ICD-10-CM | POA: Diagnosis not present

## 2017-09-21 MED ORDER — METOPROLOL SUCCINATE ER 50 MG PO TB24: ORAL_TABLET | ORAL | 3 refills | Status: DC

## 2017-09-21 NOTE — Progress Notes (Signed)
Cardiology Office Note   Date:  09/21/2017   ID:  [name], DOB [birth year], MRN [id number]  PCP:  Lance Sell, NP  Cardiologist: Dr. Martinique, 10/26/2016 [name], PA-C 01/15/2016   History of Present Illness: Hannah Barron is a 47 y.o. female with a history of bronchiectasis and COPD (never smoker), migraines, palpitations (ST & SVT documented) echo 10/2015 w/ EF 65-70%, nl TSH w/ mildly elevated free T4, possible cystic fibrosis, nl MV 12/2015.  10/2016 office visit, continue metoprolol and follow-up in 1 year 09/07/2017 PCP visit, bronchiectasis with acute exacerbation treated with Augmentin, steroids, CXR okay  Hannah Barron presents for cardiology follow up.  She completed the ABX and steroids.  When she was on the steroids, she had more problems w/ palpitations, her heart would beat very fast. It made her tired and weak. Now off the steroids, doing better.  She checks her HR at home, it will be frequently > 100, but less than 110.  She does not eat or drink anything with caffeine. She eats very little salt.   Her R leg is numb at times, she will wake with this. She has problems w/ her back. The numbness scares her. She will sit up and rub her leg, it gets better.  She never has problems with numbness when she is up and moving around.  No lower extremity edema, no orthopnea or PND.  She exercises on the treadmill and in the gym at her apartment complex, never gets chest pain or shortness of breath with this.   Past Medical History:  Diagnosis Date  . Anemia   . Bronchiectasis ([place])   . COPD (chronic obstructive pulmonary disease) ([place])   . GERD (gastroesophageal reflux disease)   . Migraines   . Paroxysmal SVT (supraventricular tachycardia) ([place])    a. diagnosed in 11/2015.  . Right leg numbness     Past Surgical History:  Procedure Laterality Date  . CESAREAN SECTION    . CYSTOSCOPY N/A 04/05/2016   Procedure: CYSTOSCOPY;  Surgeon: Lavonia Drafts, MD;  Location: [facility];  Service: Gynecology;  Laterality: N/A;  . LAPAROSCOPIC VAGINAL HYSTERECTOMY WITH SALPINGECTOMY Bilateral 04/05/2016   Procedure: LAPAROSCOPIC ASSISTED VAGINAL HYSTERECTOMY WITH SALPINGECTOMY;  Surgeon: Lavonia Drafts, MD;  Location: [facility];  Service: Gynecology;  Laterality: Bilateral;  . LUNG BIOPSY    . VIDEO BRONCHOSCOPY Bilateral 03/29/2016   Procedure: VIDEO BRONCHOSCOPY WITHOUT FLUORO;  Surgeon: Marshell Garfinkel, MD;  Location: [facility];  Service: Cardiopulmonary;  Laterality: Bilateral;    Current Outpatient Medications  Medication Sig Dispense Refill  . albuterol (VENTOLIN HFA) 108 (90 Base) MCG/ACT inhaler Inhale 2 puffs into the lungs every 6 (six) hours as needed for wheezing or shortness of breath. 3 Inhaler 3  . budesonide-formoterol (SYMBICORT) 160-4.5 MCG/ACT inhaler Inhale 2 puffs into the lungs 2 (two) times daily. 1 Inhaler 0  . clotrimazole (GYNE-LOTRIMIN) 1 % vaginal cream Apply to affected areas twice a day 60 g 2  . dicyclomine (BENTYL) 20 MG tablet TAKE 1 TABLET BY MOUTH DAILY AS NEEDED FOR SPASMS. 90 tablet 0  . estradiol (CLIMARA - DOSED IN MG/24 HR) 0.05 mg/24hr patch Place 1 patch onto the skin twice weekly 8 patch 12  . metoprolol succinate (TOPROL-XL) 50 MG 24 hr tablet Take 1 tablet (50mg ) in morning and 1/2 tablet (25mg ) in evening with or immediately following a meal. (Patient taking differently: Take 25-50 mg by mouth See admin instructions. 50 mg in the morning and 25  mg in the evening) 135 tablet 1  . omeprazole (PRILOSEC) 40 MG capsule TAKE 1 CAPSULE BY MOUTH EVERY DAY 90 capsule 0  . Respiratory Therapy Supplies (FLUTTER) DEVI Use as directed. 1 each 0  . RESTASIS 0.05 % ophthalmic emulsion Instill 1 drop into both eyes two times a day  2  . sertraline (ZOLOFT) 50 MG tablet Take 1 tablet (50 mg total) by mouth daily. 30 tablet 1  . SYMBICORT 160-4.5 MCG/ACT inhaler INHALE 2 PUFFS BY MOUTH TWICE A DAY 10.2 Inhaler  5   No current facility-administered medications for this visit.     Allergies:   Patient has no known allergies.    Social History:  The patient  reports that she has never smoked. She has never used smokeless tobacco. She reports that she does not drink alcohol or use drugs.   Family History:  The patient's family history includes Healthy in her father and mother.    ROS:  Please see the history of present illness. All other systems are reviewed and negative.    PHYSICAL EXAM: VS:  LMP 03/31/2016  , BMI There is no height or weight on file to calculate BMI. GEN: Well nourished, well developed, female in no acute distress  HEENT: normal for age  Neck: no JVD, no carotid bruit, no masses Cardiac: RRR; no murmur, no rubs, or gallops Respiratory:  clear to auscultation bilaterally, normal work of breathing GI: soft, nontender, nondistended, + BS MS: no deformity or atrophy; no edema; distal pulses are 2+ in all 4 extremities   Skin: warm and dry, no rash Neuro:  Strength and sensation are intact Psych: euthymic mood, full affect   EKG:  EKG is ordered today. The ekg ordered today demonstrates sinus rhythm, heart rate 67, nonspecific lateral T wave flattening, slightly different from previous ECG but no pathologic Q waves or ST changes  [employer] Monitoring: 12/10/2015: SVT with HR peaking at 171 bpm (lasting for less than 30 seconds)   MYOVIEW: 12/2015  The left ventricular ejection fraction is normal (55-65%).  Nuclear stress EF: 64%.  The myocardial perufusion images are normal.  There is borderline transient ischemic dilatation with a TID of 1.19. Consider further evaluation with coronary CTA if clinically indicated.  This is an intermediate risk study due to borderline TID.  Blood pressure demonstrated a normal response to exercise.  There was 23mm of J point depression with upsloping ST segment depression in the inferolateral leads    Recent Labs: 12/28/2016:  TSH 0.92 05/03/2017: Magnesium 1.8 08/16/2017: ALT 29; BUN 7; Creatinine, Ser 0.74; Hemoglobin 13.1; Platelets 264.0; Potassium 3.7; Sodium 138    Lipid Panel    Component Value Date/Time   CHOL 195 05/03/2017 1034   TRIG 128.0 05/03/2017 1034   HDL 55.10 05/03/2017 1034   CHOLHDL 4 05/03/2017 1034   VLDL 25.6 05/03/2017 1034   LDLCALC 114 (H) 05/03/2017 1034     Wt Readings from Last 3 Encounters:  09/20/17 184 lb (83.5 kg)  09/07/17 180 lb 12.8 oz (82 kg)  09/05/17 180 lb 12.8 oz (82 kg)     Other studies Reviewed: Additional studies/ records that were reviewed today include: Office notes, hospital records and testing.  ASSESSMENT AND PLAN:  1.  Palpitations: Her symptoms worsened while she was on the steroids.  I explained that that was because of the steroids and her acute illness. -Her symptoms have improved now that she is off the steroids and has recovered from the bronchiectasis  exacerbation. - She was given extra tablets of metoprolol so that she can take an extra half tablet as needed for elevated heart rate that is symptomatic. -She has no other cardiac conditions.  2.  Right leg numbness: She has some back issues and that is likely causing the problem.  She is encouraged to follow-up with her PCP for this. - Distal pulses are 2+, do not believe that there is a vascular component to the numbness.   Current medicines are reviewed at length with the patient today.  The patient does not have concerns regarding medicines.  The following changes have been made: Add as needed metoprolol for palpitations  Labs/ tests ordered today include:  No orders of the defined types were placed in this encounter.    Disposition:   FU with Dr. Martinique  Signed, [name], PA-C  09/21/2017 7:48 AM    [facility] Phone: [phone]; Fax: [phone]  This note was written with the assistance of speech recognition software. Please excuse any  transcriptional errors.

## 2017-09-21 NOTE — Patient Instructions (Signed)
Medication Instructions:  Your physician recommends that you continue on your current medications as directed. Please refer to the Current Medication list given to you today.  --ok to take additional metoprolol (1/2 tablet) as needed for palpitations for rapid heart rate  Follow-Up: Your physician wants you to follow-up in: 1 year with Dr. Martinique or [name] PA.  You will receive a reminder letter in the mail two months in advance. If you don't receive a letter, please call our office to schedule the follow-up appointment.   Any Other Special Instructions Will Be Listed Below (If Applicable).     If you need a refill on your cardiac medications before your next appointment, please call your pharmacy.

## 2017-09-27 ENCOUNTER — Other Ambulatory Visit: Payer: Self-pay | Admitting: General Practice

## 2017-09-28 MED ORDER — ESTRADIOL 0.05 MG/24HR TD PTWK: MEDICATED_PATCH | TRANSDERMAL | 2 refills | Status: DC

## 2017-10-04 NOTE — Progress Notes (Signed)
@Patient  ID: Hannah Barron, female    DOB: 07-27-1970, 47 y.o.   MRN: [id number]  Chief Complaint  Patient presents with  . Follow-up    Bronchiectasis follow up    Referring provider: Lance Sell, NP  HPI:  47 year old female patient followed in our office for chronic obstructive lung disease, bronchiectasis (diagnosed with a VATS biopsy in 2003 by pulmonologist in [place] -cystic bronchiectasis), potential latent TB.  Patient is an immigrant from [place] in 1998 does not report any exposures to TB, patient does not recall getting BCG vaccine as a child.  Patient underwent bronchoscopy to evaluate for MAI infection and positive QuantiFERON gold results.  All results have been negative except for positive PJP on DFA.  PMH: GERD, anemia, migraines Smoker/ Smoking History: Never smoker Maintenance: Symbicort 160 Pt of: Dr. Vaughan Browner  10/05/2017  - Visit   47 year old female patient presents today for follow-up visit.  Patient with bronchiectasis.  Patient reports that she is still having a is worsened in the mornings as well as at night when she lays flat.  Patient reports that symptomatically she is improved since last office visit and after completing her Augmentin prescription.  Patient denies fevers and chills.  Patient has been adherent to using her Symbicort.  Patient is Artie received her flu vaccine this year.     Tests:   A1AT 03/16/16- 161, PIMM Quantiferon 03/16/16- Positive CF panel 03/25/15- negative for 97 mutations analyzed (report scanned)  Labs 03/24/16 IgG 1477 IgA 134 IgM 168 IgE 6 ANA, CCP, RA- negative  HIV 04/01/16- Negative Beta D glucan 04/01/16- Negative  CT high resolution 07/16/15-areas of cystic and varicose bronchiectasis, extensive air trapping, multiple pulmonary nodules. CT chest 03/21/16-areas of bronchiectasis, air trapping and pulmonary nodules. Unchanged compared to 2017 CT angiogram 12/23/16-stable areas of bronchiectasis, air trapping  and pulmonary nodules.  No pulmonary embolism.  Myocardial perfusion study 12/2015 >neg  Ischemia , EF 55% Echo 10/2015 >EF 65%.   PFT 06/2015  FVC 2.45 (70%) FEV1 1.54 [56%), F/F 63, TLC 78% Moderate obstructive airway disease, minimal restriction. DLCO could not be completed  03/16/16 FVC 2.29 (69%), FEV1 1.46 (54%), F/F 64   Bronchoscopy 03/29/16 Cultures, AFB, fungal cultures-negative to date PJP DFA-positive Cytology-no malignant cells, CD4: CD8 ratio-1.62 Cell count WBC-26, 26% lymphs, 50% neutrophils, 24% monocyte macrophage    Specialty Problems      Pulmonary Problems   COPD (chronic obstructive pulmonary disease) ([place])    PFT's 07/10/2015:  FVC:2.59/ 77% FEV1 1.63/ 60% F/F Ratio: 76% TLC: 78% DLCO: Pt. Was unable to complete. Moderate Airways Obstructive Disease Minimal Restrictive disease  03/16/16 FVC 2.29 (69%), FEV1 1.46 (54%), F/F 64      Bronchiectasis without acute exacerbation ([facility])    CT high resolution 07/16/15-areas of cystic and varicose bronchiectasis, extensive air trapping, multiple pulmonary nodules. CT chest 03/21/16-areas of bronchiectasis, air trapping and pulmonary nodules. Unchanged compared to 2017 CT angiogram 12/23/16-stable areas of bronchiectasis, air trapping and pulmonary nodules.  No pulmonary embolism.       Lung nodules   Bronchiectasis with acute exacerbation ([place])      No Known Allergies  Immunization History  Administered Date(s) Administered  . Influenza,inj,Quad PF,6+ Mos 11/07/2015, 10/26/2016, 09/20/2017  . Pneumococcal Polysaccharide-23 11/07/2015    Past Medical History:  Diagnosis Date  . Anemia   . Bronchiectasis ([place])   . COPD (chronic obstructive pulmonary disease) ([place])   . GERD (gastroesophageal reflux disease)   . Migraines   .  Paroxysmal SVT (supraventricular tachycardia) ([place])    a. diagnosed in 11/2015.  . Right leg numbness     Tobacco History: Social History   Tobacco Use  Smoking Status  Never Smoker  Smokeless Tobacco Never Used   Counseling given: Yes  Continue to not smoke  Outpatient Encounter Medications as of 10/05/2017  Medication Sig  . albuterol (VENTOLIN HFA) 108 (90 Base) MCG/ACT inhaler Inhale 2 puffs into the lungs every 6 (six) hours as needed for wheezing or shortness of breath.  . budesonide-formoterol (SYMBICORT) 160-4.5 MCG/ACT inhaler Inhale 2 puffs into the lungs 2 (two) times daily.  . clotrimazole (GYNE-LOTRIMIN) 1 % vaginal cream Apply to affected areas twice a day  . dicyclomine (BENTYL) 20 MG tablet TAKE 1 TABLET BY MOUTH DAILY AS NEEDED FOR SPASMS.  Marland Kitchen estradiol (CLIMARA - DOSED IN MG/24 HR) 0.05 mg/24hr patch Place 1 patch onto the skin twice weekly  . metoprolol succinate (TOPROL-XL) 50 MG 24 hr tablet Take 1 tablet (50mg ) in morning and 1/2 tablet (25mg ) in evening. Ok to take additional 1/2 tablet as needed for palpitations or rapid HR  . omeprazole (PRILOSEC) 40 MG capsule TAKE 1 CAPSULE BY MOUTH EVERY DAY  . Respiratory Therapy Supplies (FLUTTER) DEVI Use as directed.  . RESTASIS 0.05 % ophthalmic emulsion Instill 1 drop into both eyes two times a day  . sertraline (ZOLOFT) 50 MG tablet Take 1 tablet (50 mg total) by mouth daily.  . SYMBICORT 160-4.5 MCG/ACT inhaler INHALE 2 PUFFS BY MOUTH TWICE A DAY  . budesonide-formoterol (SYMBICORT) 160-4.5 MCG/ACT inhaler Inhale 2 puffs into the lungs 2 (two) times daily.   No facility-administered encounter medications on file as of 10/05/2017.      Review of Systems  Review of Systems  Constitutional: Positive for fatigue. Negative for chills, fever and unexpected weight change.  HENT: Positive for congestion. Negative for ear pain, postnasal drip, sinus pressure and sinus pain.   Respiratory: Positive for cough (yellow mucous, in morning and at night when lay flat). Negative for chest tightness, shortness of breath and wheezing.   Cardiovascular: Negative for chest pain and palpitations.    Gastrointestinal: Negative for blood in stool, diarrhea, nausea and vomiting.  Genitourinary: Negative for dysuria, frequency and urgency.  Musculoskeletal: Negative for arthralgias.  Skin: Negative for color change.  Allergic/Immunologic: Negative for environmental allergies and food allergies.  Neurological: Negative for dizziness, light-headedness and headaches.  Psychiatric/Behavioral: Positive for sleep disturbance (Occasionally cough sleep, patient reports that this is not been significant). Negative for dysphoric mood. The patient is not nervous/anxious.   All other systems reviewed and are negative.    Physical Exam  BP 132/76 (BP Location: Right Arm, Cuff Size: Normal)   Pulse 90   Temp 99.3 F (37.4 C) (Oral)   Ht 5\' 6"  (1.676 m)   Wt 183 lb (83 kg)   LMP 03/31/2016   SpO2 98%   BMI 29.54 kg/m   Wt Readings from Last 5 Encounters:  10/05/17 183 lb (83 kg)  09/21/17 183 lb (83 kg)  09/20/17 184 lb (83.5 kg)  09/07/17 180 lb 12.8 oz (82 kg)  09/05/17 180 lb 12.8 oz (82 kg)     Physical Exam  Constitutional: She is oriented to person, place, and time and well-developed, well-nourished, and in no distress. No distress.  HENT:  Head: Normocephalic and atraumatic.  Right Ear: Hearing and external ear normal.  Left Ear: Hearing, tympanic membrane, external ear and ear canal normal.  Nose:  Nose normal. Right sinus exhibits no maxillary sinus tenderness and no frontal sinus tenderness. Left sinus exhibits no maxillary sinus tenderness and no frontal sinus tenderness.  Mouth/Throat: Uvula is midline and oropharynx is clear and moist. No oropharyngeal exudate.  Unable to visualize Right TM d/t cerumen occlusion   Eyes: Pupils are equal, round, and reactive to light.  Neck: Normal range of motion. Neck supple. No JVD present.  Cardiovascular: Normal rate, regular rhythm and normal heart sounds.  Pulmonary/Chest: Effort normal and breath sounds normal. No accessory muscle  usage. No respiratory distress. She has no decreased breath sounds. She has no wheezes. She has no rhonchi.  Abdominal: Soft. Bowel sounds are normal. There is no tenderness.  Musculoskeletal: Normal range of motion. She exhibits no edema.  Lymphadenopathy:    She has no cervical adenopathy.  Neurological: She is alert and oriented to person, place, and time. Gait normal.  Skin: Skin is warm and dry. She is not diaphoretic. No erythema.  Psychiatric: Mood, memory, affect and judgment normal.  Nursing note and vitals reviewed.     Lab Results:  CBC    Component Value Date/Time   WBC 5.1 08/16/2017 1121   RBC 4.08 08/16/2017 1121   HGB 13.1 08/16/2017 1121   HGB 12.8 08/24/2016 1616   HCT 38.9 08/16/2017 1121   HCT 38.2 08/24/2016 1616   PLT 264.0 08/16/2017 1121   PLT 325 08/24/2016 1616   MCV 95.4 08/16/2017 1121   MCV 91 08/24/2016 1616   MCH 30.3 09/12/2016 1446   MCHC 33.7 08/16/2017 1121   RDW 13.0 08/16/2017 1121   RDW 14.2 08/24/2016 1616   LYMPHSABS 1.7 05/03/2017 1034   MONOABS 0.4 05/03/2017 1034   EOSABS 0.1 05/03/2017 1034   BASOSABS 0.0 05/03/2017 1034    BMET    Component Value Date/Time   NA 138 08/16/2017 1121   NA 140 08/24/2016 1616   K 3.7 08/16/2017 1121   CL 104 08/16/2017 1121   CO2 29 08/16/2017 1121   GLUCOSE 116 (H) 08/16/2017 1121   BUN 7 08/16/2017 1121   BUN 10 08/24/2016 1616   CREATININE 0.74 08/16/2017 1121   CALCIUM 9.4 08/16/2017 1121   GFRNONAA >60 09/12/2016 1446   GFRAA >60 09/12/2016 1446    BNP No results found for: BNP  ProBNP No results found for: PROBNP  Imaging: Dg Chest 2 View  Result Date: 09/07/2017 CLINICAL DATA:  Productive cough and some chest pain for 4 days, history of bronchiectasis, COPD, paroxysmal SVT EXAM: CHEST - 2 VIEW COMPARISON:  09/13/2016 FINDINGS: Normal heart size, mediastinal contours, and pulmonary vascularity. Emphysematous changes with postsurgical changes in LEFT upper lobe and RIGHT  apical scarring. No acute infiltrate, pleural effusion, or pneumothorax. Bones unremarkable. IMPRESSION: Emphysematous changes with RIGHT upper lobe scarring and postsurgical changes in LEFT upper lobe. No acute abnormalities. Electronically Signed   By: Lavonia Dana M.D.   On: 09/07/2017 14:46      Assessment & Plan:   Pleasant 47 year old patient seen office visit today.  Will obtain sputum sample today.  Continue Symbicort.  Keep follow-up with our office in 6 to 8 weeks or sooner if symptoms worsen.  Continue flutter valve use.  May build to consider a fast sure if symptoms persist in spite of adequate pulmonary hygiene/toilet.  Patient does not believe that she has GERD.  Explained to patient that she could start like.  We can continue to monitor this.  Bronchiectasis without acute exacerbation ([place]) We  would like a sputum sample from you >>>Produced a sputum in the morning >>>Bring the cup to our lab in 2-3 hour  Continue Symbicort 160 >>> 2 puffs in the morning right when you wake up, rinse out your mouth after use, 12 hours later 2 puffs, rinse after use >>> Take this daily, no matter what >>> This is not a rescue inhaler   Bronchiectasis: This is the medical term which indicates that you have damage, dilated airways making you more susceptible to respiratory infection. Use a flutter valve 10 breaths twice a day or 4 to 5 breaths 4-5 times a day to help clear mucus out Let us know if you have cough with change in mucus color or fevers or chills.  At that point you would need an antibiotic. Maintain a healthy nutritious diet, eating whole foods Take your medications as prescribed   Follow-up in 6 to 8 weeks with Dr. Vaughan Browner   Cerumen impaction Right ear canal occluded with cerumen   GERD (gastroesophageal reflux disease) Continue to monitor for GERD symptoms  Can start over-the-counter Prilosec OTC morning soon hour before your other medications test  COPD (chronic  obstructive pulmonary disease) ([facility]) Continue Symbicort >>> 2 puffs in the morning right when you wake up, rinse out your mouth after use, 12 hours later 2 puffs, rinse after use >>> Take this daily, no matter what >>> This is not a rescue inhaler   Sputum sample today  Follow-up with our office in 6 to 8 weeks     Lauraine Rinne, NP 10/05/2017

## 2017-10-05 ENCOUNTER — Encounter: Payer: Self-pay | Admitting: Pulmonary Disease

## 2017-10-05 ENCOUNTER — Ambulatory Visit: Payer: Self-pay | Admitting: Pulmonary Disease

## 2017-10-05 ENCOUNTER — Ambulatory Visit: Payer: Medicare HMO | Admitting: Pulmonary Disease

## 2017-10-05 VITALS — BP 132/76 | HR 90 | Temp 99.3°F | Ht 66.0 in | Wt 183.0 lb

## 2017-10-05 DIAGNOSIS — J479 Bronchiectasis, uncomplicated: Secondary | ICD-10-CM | POA: Diagnosis not present

## 2017-10-05 DIAGNOSIS — K2100 Gastro-esophageal reflux disease with esophagitis, without bleeding: Secondary | ICD-10-CM

## 2017-10-05 DIAGNOSIS — H6121 Impacted cerumen, right ear: Secondary | ICD-10-CM | POA: Diagnosis not present

## 2017-10-05 DIAGNOSIS — J449 Chronic obstructive pulmonary disease, unspecified: Secondary | ICD-10-CM

## 2017-10-05 MED ORDER — BUDESONIDE-FORMOTEROL FUMARATE 160-4.5 MCG/ACT IN AERO: 2.0000 | INHALATION_SPRAY | Freq: Two times a day (BID) | RESPIRATORY_TRACT | 0 refills | Status: DC

## 2017-10-05 NOTE — Assessment & Plan Note (Signed)
We would like a sputum sample from you >>>Produced a sputum in the morning >>>Bring the cup to our lab in 2-3 hour  Continue Symbicort 160 >>> 2 puffs in the morning right when you wake up, rinse out your mouth after use, 12 hours later 2 puffs, rinse after use >>> Take this daily, no matter what >>> This is not a rescue inhaler   Bronchiectasis: This is the medical term which indicates that you have damage, dilated airways making you more susceptible to respiratory infection. Use a flutter valve 10 breaths twice a day or 4 to 5 breaths 4-5 times a day to help clear mucus out Let us know if you have cough with change in mucus color or fevers or chills.  At that point you would need an antibiotic. Maintain a healthy nutritious diet, eating whole foods Take your medications as prescribed   Follow-up in 6 to 8 weeks with Dr. Vaughan Browner

## 2017-10-05 NOTE — Patient Instructions (Addendum)
We would like a sputum sample from you >>>Produced a sputum in the morning >>>Bring the cup to our lab in 2-3 hour  Continue Symbicort 160 >>> 2 puffs in the morning right when you wake up, rinse out your mouth after use, 12 hours later 2 puffs, rinse after use >>> Take this daily, no matter what >>> This is not a rescue inhaler   Bronchiectasis: This is the medical term which indicates that you have damage, dilated airways making you more susceptible to respiratory infection. Use a flutter valve 10 breaths twice a day or 4 to 5 breaths 4-5 times a day to help clear mucus out Let Hannah Barron know if you have cough with change in mucus color or fevers or chills.  At that point you would need an antibiotic. Maintain a healthy nutritious diet, eating whole foods Take your medications as prescribed    Follow-up in 6 to 8 weeks with Dr. Vaughan Barron   It is flu season:   >>>Remember to be washing your hands regularly, using hand sanitizer, be careful to use around herself with has contact with people who are sick will increase her chances of getting sick yourself. >>> Best ways to protect herself from the flu: Receive the yearly flu vaccine, practice good hand hygiene washing with soap and also using hand sanitizer when available, eat a nutritious meals, get adequate rest, hydrate appropriately    As of 11/13/2017 we will be moving! We will no longer be at our [place] location.   Our new address and phone number will be:  [address] [place], [place] [address] Telephone number: [phone]   Please contact the office if your symptoms worsen or you have concerns that you are not improving.   Thank you for choosing [facility] for your healthcare, and for allowing Hannah Barron to partner with you on your healthcare journey. I am thankful to be able to provide care to you today.   [name] FNP-C       Bronchiectasis Bronchiectasis is a condition in which the airways (bronchi) are  damaged and widened. This makes it difficult for the lungs to get rid of mucus. As a result, mucus gathers in the airways, and this often leads to lung infections. Infection can cause inflammation in the airways, which may further weaken and damage the bronchi. What are the causes? Bronchiectasis may be present at birth (congenital) or may develop later in life. Sometimes there is no apparent cause. Some common causes include:  Cystic fibrosis.  Recurrent lung infections (such as pneumonia, tuberculosis, or fungal infections).  Foreign bodies or other blockages in the lungs.  Breathing in fluid, food, or other foreign objects (aspiration).  What are the signs or symptoms? Common symptoms include:  A daily cough that brings up mucus and lasts for more than 3 weeks.  Frequent lung infections (such as pneumonia, tuberculosis, or fungal infections).  Shortness of breath and wheezing.  Weakness and fatigue.  How is this diagnosed? Various tests may be done to help diagnose bronchiectasis. Tests may include:  Chest X-rays or CT scans.  Breathing tests to help determine how your lungs are working.  Sputum cultures to check for infection.  Blood tests and other tests to check for related diseases or causes, such as cystic fibrosis.  How is this treated? Treatment varies depending on the severity of the condition. Medicines may be given to loosen the mucus to be coughed up (expectorants), to relax the muscles of  the air passages (bronchodilators), or to prevent or treat infections (antibiotics). Physical therapy methods may be recommended to help clear mucus from the lungs. For severe cases, surgery may be done to remove the affected part of the lung. Follow these instructions at home:  Get plenty of rest.  Only take over-the-counter or prescription medicines as directed by your health care provider. If antibiotic medicines were prescribed, take them as directed. Finish them even if  you start to feel better.  Avoid sedatives and antihistamines unless otherwise directed by your health care provider. These medicines tend to thicken the mucus in the lungs.  Perform any breathing exercises or techniques to clear the lungs as directed by your health care provider.  Drink enough fluids to keep your urine clear or pale yellow.  Consider using a cold steam vaporizer or humidifier in your room or home to help loosen secretions.  If the cough is worse at night, try sleeping in a semi-upright position in a recliner or using a couple of pillows.  Avoid cigarette smoke and lung irritants. If you smoke, quit.  Stay inside when pollution and ozone levels are high.  Stay current with vaccinations and immunizations.  Follow up with your health care provider as directed. Contact a health care provider if:  You cough up more thick, discolored mucus (sputum) that is yellow to green in color.  You have a fever or persistent symptoms for more than 2-3 days.  You cannot control your cough and are losing sleep. Get help right away if:  You cough up blood.  You have chest pain or increasing shortness of breath.  You have pain that is getting worse or is uncontrolled with medicines.  You have a fever and your symptoms suddenly get worse. This information is not intended to replace advice given to you by your health care provider. Make sure you discuss any questions you have with your health care provider. Document Released: 10/24/2006 Document Revised: 06/10/2015 Document Reviewed: 07/04/2012 Elsevier Interactive Patient Education  2017 [employer].

## 2017-10-05 NOTE — Assessment & Plan Note (Signed)
Continue Symbicort >>> 2 puffs in the morning right when you wake up, rinse out your mouth after use, 12 hours later 2 puffs, rinse after use >>> Take this daily, no matter what >>> This is not a rescue inhaler   Sputum sample today  Follow-up with our office in 6 to 8 weeks

## 2017-10-05 NOTE — Assessment & Plan Note (Signed)
Right ear canal occluded with cerumen

## 2017-10-05 NOTE — Assessment & Plan Note (Signed)
Continue to monitor for GERD symptoms  Can start over-the-counter Prilosec OTC morning soon hour before your other medications test

## 2017-10-06 ENCOUNTER — Other Ambulatory Visit: Payer: Medicare HMO

## 2017-10-06 DIAGNOSIS — J479 Bronchiectasis, uncomplicated: Secondary | ICD-10-CM

## 2017-10-06 NOTE — Addendum Note (Signed)
Addended by: Trenda Moots on: 8/33/3832 12:30 PM   Modules accepted: Orders

## 2017-10-16 NOTE — Progress Notes (Signed)
Can we follow-up with the patient.    She will need to produce another sputum sample this does not need to be in office visit.  For fungal and respiratory culture.  We can place the orders today and the patient can produce a sputum if she still is a cup.  Or she can pick up a cup to produce a sputum sample.  [name], FNP

## 2017-10-16 NOTE — Progress Notes (Signed)
Was able to talk to patient regarding patient's sputum results.  They verbalized an understanding of what was discussed. No further questions at this time.  Patient will come today and pick up sample cups. Order placed.

## 2017-10-17 ENCOUNTER — Other Ambulatory Visit: Payer: Self-pay | Admitting: Nurse Practitioner

## 2017-10-17 DIAGNOSIS — K2100 Gastro-esophageal reflux disease with esophagitis, without bleeding: Secondary | ICD-10-CM

## 2017-10-17 DIAGNOSIS — F419 Anxiety disorder, unspecified: Secondary | ICD-10-CM

## 2017-10-18 ENCOUNTER — Other Ambulatory Visit: Payer: Medicare HMO

## 2017-10-18 DIAGNOSIS — J479 Bronchiectasis, uncomplicated: Secondary | ICD-10-CM | POA: Diagnosis not present

## 2017-10-20 ENCOUNTER — Ambulatory Visit (INDEPENDENT_AMBULATORY_CARE_PROVIDER_SITE_OTHER): Payer: Medicare HMO | Admitting: Nurse Practitioner

## 2017-10-20 ENCOUNTER — Encounter: Payer: Self-pay | Admitting: Nurse Practitioner

## 2017-10-20 VITALS — BP 100/60 | HR 90 | Ht 66.0 in | Wt 185.0 lb

## 2017-10-20 DIAGNOSIS — R140 Abdominal distension (gaseous): Secondary | ICD-10-CM | POA: Diagnosis not present

## 2017-10-20 DIAGNOSIS — H6122 Impacted cerumen, left ear: Secondary | ICD-10-CM

## 2017-10-20 DIAGNOSIS — H6121 Impacted cerumen, right ear: Secondary | ICD-10-CM

## 2017-10-20 NOTE — Patient Instructions (Addendum)
You can try IBguard- available over the counter  I would also recommend smaller, more frequent meals rather than large meals.  I will plan to see you back in about 6 months for your annual physical, but please follow up sooner if your bloating persists or does not improve.   Abdominal Bloating When you have abdominal bloating, your abdomen may feel full, tight, or painful. It may also look bigger than normal or swollen (distended). Common causes of abdominal bloating include:  Swallowing air.  Constipation.  Problems digesting food.  Eating too much.  Irritable bowel syndrome. This is a condition that affects the large intestine.  Lactose intolerance. This is an inability to digest lactose, a natural sugar in dairy products.  Celiac disease. This is a condition that affects the ability to digest gluten, a protein found in some grains.  Gastroparesis. This is a condition that slows down the movement of food in the stomach and small intestine. It is more common in people with diabetes mellitus.  Gastroesophageal reflux disease (GERD). This is a digestive condition that makes stomach acid flow back into the esophagus.  Urinary retention. This means that the body is holding onto urine, and the bladder cannot be emptied all the way.  Follow these instructions at home: Eating and drinking  Avoid eating too much.  Try not to swallow air while talking or eating.  Avoid eating while lying down.  Avoid these foods and drinks: ? Foods that cause gas, such as broccoli, cabbage, cauliflower, and baked beans. ? Carbonated drinks. ? Hard candy. ? Chewing gum. Medicines  Take over-the-counter and prescription medicines only as told by your health care provider.  Take probiotic medicines. These medicines contain live bacteria or yeasts that can help digestion.  Take coated peppermint oil capsules. Activity  Try to exercise regularly. Exercise may help to relieve bloating that is  caused by gas and relieve constipation. General instructions  Keep all follow-up visits as told by your health care provider. This is important. Contact a health care provider if:  You have nausea and vomiting.  You have diarrhea.  You have abdominal pain.  You have unusual weight loss or weight gain.  You have severe pain, and medicines do not help. Get help right away if:  You have severe chest pain.  You have trouble breathing.  You have shortness of breath.  You have trouble urinating.  You have darker urine than normal.  You have blood in your stools or have dark, tarry stools. Summary  Abdominal bloating means that the abdomen is swollen.  Common causes of abdominal bloating are swallowing air, constipation, and problems digesting food.  Avoid eating too much and avoid swallowing air.  Avoid foods that cause gas, carbonated drinks, hard candy, and chewing gum. This information is not intended to replace advice given to you by your health care provider. Make sure you discuss any questions you have with your health care provider. Document Released: 01/29/2016 Document Revised: 01/29/2016 Document Reviewed: 01/29/2016 Elsevier Interactive Patient Education  [employer].

## 2017-10-20 NOTE — Progress Notes (Signed)
Hannah Barron is a 47 y.o. female with the following history as recorded in EpicCare:  Patient Active Problem List   Diagnosis Date Noted  . Cerumen impaction 09/07/2017  . Acne vulgaris 12/28/2016  . Bronchiectasis with acute exacerbation ([place]) 12/22/2016  . Medicare annual wellness visit, subsequent 04/28/2016  . Encounter for general adult medical examination with abnormal findings 04/28/2016  . Fibroids, intramural 04/01/2016  . Abnormal uterine bleeding (AUB) 04/01/2016  . Lung nodules 03/16/2016  . Palpitations   . GERD (gastroesophageal reflux disease) 07/30/2015  . Bronchiectasis without acute exacerbation ([place]) 07/08/2015  . COPD (chronic obstructive pulmonary disease) ([place]) 06/04/2015  . Fatigue 06/04/2015    Current Outpatient Medications  Medication Sig Dispense Refill  . albuterol (VENTOLIN HFA) 108 (90 Base) MCG/ACT inhaler Inhale 2 puffs into the lungs every 6 (six) hours as needed for wheezing or shortness of breath. 3 Inhaler 3  . budesonide-formoterol (SYMBICORT) 160-4.5 MCG/ACT inhaler Inhale 2 puffs into the lungs 2 (two) times daily. 1 Inhaler 0  . clotrimazole (GYNE-LOTRIMIN) 1 % vaginal cream Apply to affected areas twice a day 60 g 2  . dicyclomine (BENTYL) 20 MG tablet TAKE 1 TABLET BY MOUTH DAILY AS NEEDED FOR SPASMS. 90 tablet 0  . estradiol (CLIMARA - DOSED IN MG/24 HR) 0.05 mg/24hr patch Place 1 patch onto the skin twice weekly 24 patch 2  . metoprolol succinate (TOPROL-XL) 50 MG 24 hr tablet Take 1 tablet (50mg ) in morning and 1/2 tablet (25mg ) in evening. Ok to take additional 1/2 tablet as needed for palpitations or rapid HR 150 tablet 3  . omeprazole (PRILOSEC) 40 MG capsule TAKE 1 CAPSULE BY MOUTH EVERY DAY 90 capsule 0  . Respiratory Therapy Supplies (FLUTTER) DEVI Use as directed. 1 each 0  . RESTASIS 0.05 % ophthalmic emulsion Instill 1 drop into both eyes two times a day  2   No current facility-administered medications for this visit.      Allergies: Patient has no known allergies.  Past Medical History:  Diagnosis Date  . Anemia   . Bronchiectasis ([place])   . COPD (chronic obstructive pulmonary disease) ([place])   . GERD (gastroesophageal reflux disease)   . Migraines   . Paroxysmal SVT (supraventricular tachycardia) ([place])    a. diagnosed in 11/2015.  . Right leg numbness     Past Surgical History:  Procedure Laterality Date  . CESAREAN SECTION    . CYSTOSCOPY N/A 04/05/2016   Procedure: CYSTOSCOPY;  Surgeon: Lavonia Drafts, MD;  Location: [facility];  Service: Gynecology;  Laterality: N/A;  . LAPAROSCOPIC VAGINAL HYSTERECTOMY WITH SALPINGECTOMY Bilateral 04/05/2016   Procedure: LAPAROSCOPIC ASSISTED VAGINAL HYSTERECTOMY WITH SALPINGECTOMY;  Surgeon: Lavonia Drafts, MD;  Location: [facility];  Service: Gynecology;  Laterality: Bilateral;  . LUNG BIOPSY    . VIDEO BRONCHOSCOPY Bilateral 03/29/2016   Procedure: VIDEO BRONCHOSCOPY WITHOUT FLUORO;  Surgeon: Marshell Garfinkel, MD;  Location: [facility];  Service: Cardiopulmonary;  Laterality: Bilateral;    Family History  Problem Relation Age of Onset  . Healthy Mother   . Healthy Father     Social History   Tobacco Use  . Smoking status: Never Smoker  . Smokeless tobacco: Never Used  Substance Use Topics  . Alcohol use: No    Alcohol/week: 0.0 standard drinks     Subjective:   NOTE FROM 09/20/17- Here today for follow up of abdominal pain, last seen for same on 08/16/17, instructed to try gluten free diet and probiotics after normal CBC, CMET, lipase,  abdominal xray were done at her 08/16/17 OV. Urine testing including urine culture were also normal. She also had imaging and GI eval earlier this year for her abdominal pain which were unremarkable. She tells me today she has tried to follow the gluten free diet, stopped rice, and has started  The probiotic, but she continues to have intermittent left sided abdominal pain most days, seems to occur at night when she is  lying down to sleep. She reports sometimes she has trouble sleeping because she is worried about the pain and other medical problems. She occasionally feels constipated.  TODAY- At her last OV on 09/20/17, we decided to trial zoloft to see if her mood was playing a role in her symptoms of abdominal pain due to a normal extensive GI workup, she tells me today that she did start the zoloft, took it for 4 days but stopped due to medication side effects- made her feel angry and not like herself.  She did purchase a probiotic tea last week, she has noticed since drinking the probiotic tea she has not had anymore abdominal pain She does continue to have abdominal bloating, intermittently, sometimes when she eats or sometimes notices even if she has not recently eaten.  She denies fevers, chills, weakness, syncope, anorexia, chest pain, shortness of breath, nausea, vomiting, diarrhea, depression.  She is also requesting irrigation of her ear today, she says one of her other providers told her she had an earwax impaction  ROS- See HPI   Objective:  Vitals:   10/20/17 1448  BP: 100/60  Pulse: 90  SpO2: 97%  Weight: 185 lb (83.9 kg)  Height: 5\' 6"  (1.676 m)  Body mass index is 29.86 kg/m.   General: Well developed, well nourished, in no acute distress  Skin : Warm and dry.  Head: Normocephalic and atraumatic  Eyes: Sclera and conjunctiva clear; pupils round and reactive to light; extraocular movements intact  Ears: External normal; canals clear; left tympanic membrane normal, right TM obstructed by cerumen Oropharynx: Pink, supple.  Neck: Supple Lungs: Respirations unlabored; clear to auscultation bilaterally without wheeze, rales, rhonchi  CVS exam: normal rate, regular rhythm, normal S1, S2, no murmurs, rubs, clicks or gallops.  Abdomen: Soft; rotund, nontender;  normoactive bowel sounds; no masses or hepatosplenomegaly  Extremities: No edema, cyanosis Vessels: Symmetric bilaterally   Neurologic: Alert and oriented; speech intact; face symmetrical; moves all extremities well; CNII-XII intact without focal deficit   Assessment:  1. Abdominal bloating   2. Impacted cerumen of right ear     Plan:   Return for 1 week for repeat irrigation, 6 mo for CPE.  No orders of the defined types were placed in this encounter.   Requested Prescriptions    No prescriptions requested or ordered in this encounter    Abdominal bloating Extensive diagnostic workup unremarkable to this point We discussed frequent small meals, IB guard, continued probiotics Home management, red flags and return precautions including when to seek immediate care discussed and printed on AVS Return if symptoms worsen or fail to improve  Impacted cerumen of right ear irrigation/lavage of left ear without successful removal of impacted cerumen Patient tolerated well Instructed to use debrox x1 week and RTC for repeat irrigation

## 2017-10-27 ENCOUNTER — Ambulatory Visit: Payer: Self-pay | Admitting: Nurse Practitioner

## 2017-10-27 DIAGNOSIS — Z0289 Encounter for other administrative examinations: Secondary | ICD-10-CM

## 2017-10-27 NOTE — Progress Notes (Signed)
No fungal elements seen.   Hannah Barron

## 2017-11-13 ENCOUNTER — Ambulatory Visit (INDEPENDENT_AMBULATORY_CARE_PROVIDER_SITE_OTHER): Payer: Medicare HMO | Admitting: Nurse Practitioner

## 2017-11-13 ENCOUNTER — Encounter: Payer: Self-pay | Admitting: Nurse Practitioner

## 2017-11-13 DIAGNOSIS — H6121 Impacted cerumen, right ear: Secondary | ICD-10-CM

## 2017-11-13 NOTE — Patient Instructions (Signed)
It was nice to see you today!   Earwax Buildup, Adult The ears produce a substance called earwax that helps keep bacteria out of the ear and protects the skin in the ear canal. Occasionally, earwax can build up in the ear and cause discomfort or hearing loss. What increases the risk? This condition is more likely to develop in people who:  Are female.  Are elderly.  Naturally produce more earwax.  Clean their ears often with cotton swabs.  Use earplugs often.  Use in-ear headphones often.  Wear hearing aids.  Have narrow ear canals.  Have earwax that is overly thick or sticky.  Have eczema.  Are dehydrated.  Have excess hair in the ear canal.  What are the signs or symptoms? Symptoms of this condition include:  Reduced or muffled hearing.  A feeling of fullness in the ear or feeling that the ear is plugged.  Fluid coming from the ear.  Ear pain.  Ear itch.  Ringing in the ear.  Coughing.  An obvious piece of earwax that can be seen inside the ear canal.  How is this diagnosed? This condition may be diagnosed based on:  Your symptoms.  Your medical history.  An ear exam. During the exam, your health care provider will look into your ear with an instrument called an otoscope.  You may have tests, including a hearing test. How is this treated? This condition may be treated by:  Using ear drops to soften the earwax.  Having the earwax removed by a health care provider. The health care provider may: ? Flush the ear with water. ? Use an instrument that has a loop on the end (curette). ? Use a suction device.  Surgery to remove the wax buildup. This may be done in severe cases.  Follow these instructions at home:  Take over-the-counter and prescription medicines only as told by your health care provider.  Do not put any objects, including cotton swabs, into your ear. You can clean the opening of your ear canal with a washcloth or facial  tissue.  Follow instructions from your health care provider about cleaning your ears. Do not over-clean your ears.  Drink enough fluid to keep your urine clear or pale yellow. This will help to thin the earwax.  Keep all follow-up visits as told by your health care provider. If earwax builds up in your ears often or if you use hearing aids, consider seeing your health care provider for routine, preventive ear cleanings. Ask your health care provider how often you should schedule your cleanings.  If you have hearing aids, clean them according to instructions from the manufacturer and your health care provider. Contact a health care provider if:  You have ear pain.  You develop a fever.  You have blood, pus, or other fluid coming from your ear.  You have hearing loss.  You have ringing in your ears that does not go away.  Your symptoms do not improve with treatment.  You feel like the room is spinning (vertigo). Summary  Earwax can build up in the ear and cause discomfort or hearing loss.  The most common symptoms of this condition include reduced or muffled hearing and a feeling of fullness in the ear or feeling that the ear is plugged.  This condition may be diagnosed based on your symptoms, your medical history, and an ear exam.  This condition may be treated by using ear drops to soften the earwax or by having the  earwax removed by a health care provider.  Do not put any objects, including cotton swabs, into your ear. You can clean the opening of your ear canal with a washcloth or facial tissue. This information is not intended to replace advice given to you by your health care provider. Make sure you discuss any questions you have with your health care provider. Document Released: 02/04/2004 Document Revised: 03/09/2016 Document Reviewed: 03/09/2016 Elsevier Interactive Patient Education  [employer].

## 2017-11-13 NOTE — Progress Notes (Signed)
Hannah Barron is a 47 y.o. female with the following history as recorded in EpicCare:  Patient Active Problem List   Diagnosis Date Noted  . Cerumen impaction 09/07/2017  . Acne vulgaris 12/28/2016  . Bronchiectasis with acute exacerbation ([place]) 12/22/2016  . Medicare annual wellness visit, subsequent 04/28/2016  . Encounter for general adult medical examination with abnormal findings 04/28/2016  . Fibroids, intramural 04/01/2016  . Abnormal uterine bleeding (AUB) 04/01/2016  . Lung nodules 03/16/2016  . Palpitations   . GERD (gastroesophageal reflux disease) 07/30/2015  . Bronchiectasis without acute exacerbation ([place]) 07/08/2015  . COPD (chronic obstructive pulmonary disease) ([place]) 06/04/2015  . Fatigue 06/04/2015    Current Outpatient Medications  Medication Sig Dispense Refill  . albuterol (VENTOLIN HFA) 108 (90 Base) MCG/ACT inhaler Inhale 2 puffs into the lungs every 6 (six) hours as needed for wheezing or shortness of breath. 3 Inhaler 3  . budesonide-formoterol (SYMBICORT) 160-4.5 MCG/ACT inhaler Inhale 2 puffs into the lungs 2 (two) times daily. 1 Inhaler 0  . clotrimazole (GYNE-LOTRIMIN) 1 % vaginal cream Apply to affected areas twice a day 60 g 2  . dicyclomine (BENTYL) 20 MG tablet TAKE 1 TABLET BY MOUTH DAILY AS NEEDED FOR SPASMS. 90 tablet 0  . estradiol (CLIMARA - DOSED IN MG/24 HR) 0.05 mg/24hr patch Place 1 patch onto the skin twice weekly 24 patch 2  . metoprolol succinate (TOPROL-XL) 50 MG 24 hr tablet Take 1 tablet (50mg ) in morning and 1/2 tablet (25mg ) in evening. Ok to take additional 1/2 tablet as needed for palpitations or rapid HR 150 tablet 3  . omeprazole (PRILOSEC) 40 MG capsule TAKE 1 CAPSULE BY MOUTH EVERY DAY 90 capsule 0  . Respiratory Therapy Supplies (FLUTTER) DEVI Use as directed. 1 each 0  . RESTASIS 0.05 % ophthalmic emulsion Instill 1 drop into both eyes two times a day  2   No current facility-administered medications for this visit.      Allergies: Patient has no known allergies.  Past Medical History:  Diagnosis Date  . Anemia   . Bronchiectasis ([place])   . COPD (chronic obstructive pulmonary disease) ([place])   . GERD (gastroesophageal reflux disease)   . Migraines   . Paroxysmal SVT (supraventricular tachycardia) ([place])    a. diagnosed in 11/2015.  . Right leg numbness     Past Surgical History:  Procedure Laterality Date  . CESAREAN SECTION    . CYSTOSCOPY N/A 04/05/2016   Procedure: CYSTOSCOPY;  Surgeon: Lavonia Drafts, MD;  Location: [facility];  Service: Gynecology;  Laterality: N/A;  . LAPAROSCOPIC VAGINAL HYSTERECTOMY WITH SALPINGECTOMY Bilateral 04/05/2016   Procedure: LAPAROSCOPIC ASSISTED VAGINAL HYSTERECTOMY WITH SALPINGECTOMY;  Surgeon: Lavonia Drafts, MD;  Location: [facility];  Service: Gynecology;  Laterality: Bilateral;  . LUNG BIOPSY    . VIDEO BRONCHOSCOPY Bilateral 03/29/2016   Procedure: VIDEO BRONCHOSCOPY WITHOUT FLUORO;  Surgeon: Marshell Garfinkel, MD;  Location: [facility];  Service: Cardiopulmonary;  Laterality: Bilateral;    Family History  Problem Relation Age of Onset  . Healthy Mother   . Healthy Father     Social History   Tobacco Use  . Smoking status: Never Smoker  . Smokeless tobacco: Never Used  Substance Use Topics  . Alcohol use: No    Alcohol/week: 0.0 standard drinks     Subjective:  Hannah Barron is here today for irrigation of right ear, for impacted cerumen, unable to remove cerumen on 10/20/17 OV, she was instructed to use debrox x 1 week and return  for repeat irrigation, was unable to return until today. She reports she has been using debrox drops at home, denies fevers, chills, ear pain, headaches.  ROS- See HPI  Objective:  Vitals:   11/13/17 1514  BP: 108/68  Pulse: 78  SpO2: 96%  Weight: 186 lb (84.4 kg)  Height: 5\' 6"  (1.676 m)    General: Well developed, well nourished, in no acute distress  Skin : Warm and dry.  Head: Normocephalic and atraumatic   Eyes: Sclera and conjunctiva clear; pupils round and reactive to light; extraocular movements intact  Ears: External normal; left tympanic membrane normal, right TM normal, visualized after cerumen removal Oropharynx: Pink, supple.  Neck: Supple Lungs: Respirations unlabored CVS exam: normal rate, regular rhythm Vessels: Symmetric bilaterally  Neurologic: Alert and oriented; speech intact; face symmetrical; moves all extremities well; CNII-XII intact without focal deficit    Assessment:  1. Impacted cerumen of right ear     Plan:   Impacted cerumen of right ear irrigation/lavage of right ear with successful removal of impacted cerumen Patient tolerated well  F/u for new or worsening symptoms Home management, red flags and return precautions including when to seek immediate care discussed and printed on AVS  No follow-ups on file.  No orders of the defined types were placed in this encounter.   Requested Prescriptions    No prescriptions requested or ordered in this encounter

## 2017-11-16 LAB — FUNGUS CULTURE W SMEAR
MICRO NUMBER:: 91214552
SMEAR:: NONE SEEN
SPECIMEN QUALITY: ADEQUATE

## 2017-11-21 DIAGNOSIS — Z23 Encounter for immunization: Secondary | ICD-10-CM | POA: Diagnosis not present

## 2017-11-21 DIAGNOSIS — L700 Acne vulgaris: Secondary | ICD-10-CM | POA: Diagnosis not present

## 2017-11-21 DIAGNOSIS — Z79899 Other long term (current) drug therapy: Secondary | ICD-10-CM | POA: Diagnosis not present

## 2017-11-25 LAB — AFB CULTURE WITH SMEAR (NOT AT ARMC)
ACID FAST CULTURE: NEGATIVE
ACID FAST SMEAR: NEGATIVE

## 2017-11-26 ENCOUNTER — Other Ambulatory Visit: Payer: Self-pay | Admitting: Gastroenterology

## 2017-11-27 ENCOUNTER — Ambulatory Visit: Payer: Medicare HMO | Admitting: Pulmonary Disease

## 2017-11-27 ENCOUNTER — Ambulatory Visit (INDEPENDENT_AMBULATORY_CARE_PROVIDER_SITE_OTHER): Payer: Medicare HMO | Admitting: Pulmonary Disease

## 2017-11-27 ENCOUNTER — Encounter: Payer: Self-pay | Admitting: Pulmonary Disease

## 2017-11-27 VITALS — BP 118/60 | HR 95 | Ht 67.0 in | Wt 186.0 lb

## 2017-11-27 DIAGNOSIS — J449 Chronic obstructive pulmonary disease, unspecified: Secondary | ICD-10-CM

## 2017-11-27 DIAGNOSIS — J479 Bronchiectasis, uncomplicated: Secondary | ICD-10-CM | POA: Diagnosis not present

## 2017-11-27 LAB — PULMONARY FUNCTION TEST
FEF 25-75 POST: 0.8 L/s
FEF 25-75 Pre: 0.82 L/sec
FEF2575-%CHANGE-POST: -2 %
FEF2575-%PRED-POST: 26 %
FEF2575-%Pred-Pre: 26 %
FEV1-%CHANGE-POST: 0 %
FEV1-%PRED-POST: 48 %
FEV1-%PRED-PRE: 48 %
FEV1-POST: 1.53 L
FEV1-Pre: 1.52 L
FEV1FVC-%CHANGE-POST: 1 %
FEV1FVC-%PRED-PRE: 78 %
FEV6-%Change-Post: -2 %
FEV6-%Pred-Post: 60 %
FEV6-%Pred-Pre: 62 %
FEV6-PRE: 2.41 L
FEV6-Post: 2.35 L
FEV6FVC-%Change-Post: 0 %
FEV6FVC-%PRED-PRE: 102 %
FEV6FVC-%Pred-Post: 102 %
FVC-%CHANGE-POST: -1 %
FVC-%PRED-POST: 59 %
FVC-%Pred-Pre: 60 %
FVC-Post: 2.37 L
FVC-Pre: 2.41 L
POST FEV1/FVC RATIO: 64 %
PRE FEV6/FVC RATIO: 100 %
Post FEV6/FVC ratio: 100 %
Pre FEV1/FVC ratio: 63 %
RV % PRED: 147 %
RV: 2.77 L
TLC % pred: 92 %
TLC: 5.08 L

## 2017-11-27 NOTE — Patient Instructions (Signed)
Glad you are doing well Your lung function test shows stable lung function Continue inhalers and flutter valve Follow-up in 6 months.

## 2017-11-27 NOTE — Progress Notes (Signed)
Hannah Barron    [id number]    [birth year]  Primary Care Physician:Shambley, Delphia Grates, NP  Referring Physician: Lance Sell, NP [address], [place] [id number]  Chief complaint:  Follow up for cystic bronchiectasis  HPI: 47 year old with chronic obstructive lung disease, bronchiectasis. She was previously followed at [place] with a VATS biopsy in 2003 and was told she had cystic bronchiectasis. She had followed up with a pulmonologist at [facility] but has moved to [place] on 2017. We have been unable to get records from [place] in spite of multiple attempts.  She is an immigrant from [place] in 1998 and does not report any exposure to tuberculosis. She does not recall getting a BCG vaccine as a child. Underwent a bronchoscopy to evaluate for MAI infection and a positive quantiferon as she was unable to give a good sputum sample. All results are negative except for positive PJP on DFA. She underwent further evaluation with negative HIV and beta glucan tests.  The PJP test was thought to be false positive She had a hysterectomy for uterine fibroids  On 3/27  Interim history: She has been stable in terms of her symptoms Has chronic cough with white mucus.  She is using the Symbicort and flutter valve every day\  Outpatient Encounter Medications as of 11/27/2017  Medication Sig  . albuterol (VENTOLIN HFA) 108 (90 Base) MCG/ACT inhaler Inhale 2 puffs into the lungs every 6 (six) hours as needed for wheezing or shortness of breath.  . budesonide-formoterol (SYMBICORT) 160-4.5 MCG/ACT inhaler Inhale 2 puffs into the lungs 2 (two) times daily.  . clotrimazole (GYNE-LOTRIMIN) 1 % vaginal cream Apply to affected areas twice a day  . dicyclomine (BENTYL) 20 MG tablet TAKE 1 TABLET BY MOUTH DAILY AS NEEDED FOR SPASMS.  Marland Kitchen estradiol (CLIMARA - DOSED IN MG/24 HR) 0.05 mg/24hr patch Place 1 patch onto the skin twice weekly  . metoprolol  succinate (TOPROL-XL) 50 MG 24 hr tablet Take 1 tablet (50mg ) in morning and 1/2 tablet (25mg ) in evening. Ok to take additional 1/2 tablet as needed for palpitations or rapid HR  . omeprazole (PRILOSEC) 40 MG capsule TAKE 1 CAPSULE BY MOUTH EVERY DAY  . Respiratory Therapy Supplies (FLUTTER) DEVI Use as directed.  . RESTASIS 0.05 % ophthalmic emulsion Instill 1 drop into both eyes two times a day   No facility-administered encounter medications on file as of 11/27/2017.    Physical Exam: Blood pressure 118/60, pulse 95, height 5\' 7"  (1.702 m), weight 186 lb (84.4 kg), last menstrual period 03/31/2016, SpO2 98 %. Gen:      No acute distress HEENT:  EOMI, sclera anicteric Neck:     No masses; no thyromegaly Lungs:    Clear to auscultation bilaterally; normal respiratory effort CV:         Regular rate and rhythm; no murmurs Abd:      + bowel sounds; soft, non-tender; no palpable masses, no distension Ext:    No edema; adequate peripheral perfusion Skin:      Warm and dry; no rash Neuro: alert and oriented x 3 Psych: normal mood and affect  Data Reviewed: Imaging CT high resolution 07/16/15-areas of cystic and varicose bronchiectasis, extensive air trapping, multiple pulmonary nodules. CT chest 03/21/16-areas of bronchiectasis, air trapping and pulmonary nodules. Unchanged compared to 2017 CT angiogram 12/23/16-stable areas of bronchiectasis, air trapping and pulmonary nodules.  No pulmonary embolism. I have reviewed  all images personally.  PFT  06/2015  FVC 2.45 (70%), FEV1 1.54 [56%), F/F 63, TLC 78%  03/16/16 FVC 2.29 (69%), FEV1 1.46 (54%), F/F 64  11/27/2017 FVC 2.37 [59%), FEV1 1.53 [48%), F/F 64, TLC 92% Severe obstruction   Labs 03/24/16 IgG 1477 IgA 134 IgM 168 IgE 6 ANA, CCP, RA- negative  HIV 04/01/16- Negative Beta D glucan 04/01/16- Negative  A1AT 03/16/16- 161, PIMM Quantiferon 03/16/16- Positive CF panel 03/25/15- negative for 97 mutations analyzed (report  scanned)  Cardiac Myocardial perfusion study 12/2015 >neg  Ischemia , EF 55% Echo 10/2015 >EF 65%.   Bronchoscopy 03/29/16 Cultures, AFB, fungal cultures-negative to date PJP DFA-positive Cytology-no malignant cells, CD4: CD8 ratio-1.62 Cell count WBC-26, 26% lymphs, 50% neutrophils, 24% monocyte macrophage  Assessment:  Cystic bronchiectasis She's had workup which is negative for alpha-1 antitrypsin, immunoglobulin deficiency, autoimmune disease, cystic fibrosis and mycobacterial infections.    Continues on Symbicort, albuterol as needed Continue mucociliary clearance with flutter valve, Mucinex Completed pulmonary rehab.  She continues to be active at home PFTs reviewed with stable lung function.  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 Discussed INH therapy but defer as per pt wishes  Plan/Recommendations: - Continue symbicort, albuterol - Flutter valve,  Mucinex  Marshell Garfinkel MD [facility] 11/27/2017, 1:39 PM  CC: Lance Sell, NP

## 2017-11-27 NOTE — Telephone Encounter (Signed)
Incoming refill request for Bentyl 20 mg 1 daily as needed.  Last seen 06-02-2017. Please advise.

## 2017-11-27 NOTE — Progress Notes (Addendum)
Patient completed full PFT today. Patient was unable to complete DLCO due to not able to take a deep breath in to hold. Patient stated it hurt her chest to take a deep breath during DLCO test. Pt asked to stop after two tries.

## 2017-11-28 ENCOUNTER — Ambulatory Visit: Payer: Self-pay | Admitting: Pulmonary Disease

## 2017-12-20 ENCOUNTER — Other Ambulatory Visit: Payer: Self-pay

## 2017-12-20 ENCOUNTER — Other Ambulatory Visit: Payer: Self-pay | Admitting: Pulmonary Disease

## 2017-12-20 DIAGNOSIS — J479 Bronchiectasis, uncomplicated: Secondary | ICD-10-CM

## 2017-12-20 MED ORDER — BUDESONIDE-FORMOTEROL FUMARATE 160-4.5 MCG/ACT IN AERO: 2.0000 | INHALATION_SPRAY | Freq: Two times a day (BID) | RESPIRATORY_TRACT | 5 refills | Status: DC

## 2017-12-20 MED ORDER — BUDESONIDE-FORMOTEROL FUMARATE 160-4.5 MCG/ACT IN AERO
2.0000 | INHALATION_SPRAY | Freq: Two times a day (BID) | RESPIRATORY_TRACT | 5 refills | Status: DC
Start: 2017-12-20 — End: 2020-02-24

## 2017-12-25 DIAGNOSIS — Z79899 Other long term (current) drug therapy: Secondary | ICD-10-CM | POA: Diagnosis not present

## 2017-12-25 DIAGNOSIS — Z23 Encounter for immunization: Secondary | ICD-10-CM | POA: Diagnosis not present

## 2017-12-25 DIAGNOSIS — L700 Acne vulgaris: Secondary | ICD-10-CM | POA: Diagnosis not present

## 2018-01-23 ENCOUNTER — Other Ambulatory Visit: Payer: Self-pay | Admitting: Nurse Practitioner

## 2018-01-24 DIAGNOSIS — Z23 Encounter for immunization: Secondary | ICD-10-CM | POA: Diagnosis not present

## 2018-01-24 DIAGNOSIS — L700 Acne vulgaris: Secondary | ICD-10-CM | POA: Diagnosis not present

## 2018-01-24 DIAGNOSIS — Z79899 Other long term (current) drug therapy: Secondary | ICD-10-CM | POA: Diagnosis not present

## 2018-01-28 ENCOUNTER — Other Ambulatory Visit: Payer: Self-pay | Admitting: Cardiology

## 2018-02-27 DIAGNOSIS — Z79899 Other long term (current) drug therapy: Secondary | ICD-10-CM | POA: Diagnosis not present

## 2018-02-27 DIAGNOSIS — L700 Acne vulgaris: Secondary | ICD-10-CM | POA: Diagnosis not present

## 2018-02-27 DIAGNOSIS — Z23 Encounter for immunization: Secondary | ICD-10-CM | POA: Diagnosis not present

## 2018-03-05 ENCOUNTER — Encounter: Payer: Self-pay | Admitting: Nurse Practitioner

## 2018-03-05 ENCOUNTER — Ambulatory Visit (INDEPENDENT_AMBULATORY_CARE_PROVIDER_SITE_OTHER): Payer: Medicare HMO | Admitting: Pulmonary Disease

## 2018-03-05 ENCOUNTER — Ambulatory Visit (INDEPENDENT_AMBULATORY_CARE_PROVIDER_SITE_OTHER): Payer: Medicare HMO | Admitting: Nurse Practitioner

## 2018-03-05 ENCOUNTER — Ambulatory Visit (INDEPENDENT_AMBULATORY_CARE_PROVIDER_SITE_OTHER)
Admission: RE | Admit: 2018-03-05 | Discharge: 2018-03-05 | Disposition: A | Discharge status: Home / Self Care | Payer: Medicare HMO | Source: Ambulatory Visit | Attending: Pulmonary Disease | Admitting: Pulmonary Disease

## 2018-03-05 ENCOUNTER — Encounter: Payer: Self-pay | Admitting: Pulmonary Disease

## 2018-03-05 ENCOUNTER — Other Ambulatory Visit: Payer: Medicare HMO

## 2018-03-05 VITALS — BP 118/64 | HR 79 | Ht 67.0 in | Wt 180.2 lb

## 2018-03-05 VITALS — BP 110/60 | HR 85 | Ht 67.0 in | Wt 180.0 lb

## 2018-03-05 DIAGNOSIS — J471 Bronchiectasis with (acute) exacerbation: Secondary | ICD-10-CM | POA: Diagnosis not present

## 2018-03-05 DIAGNOSIS — R300 Dysuria: Secondary | ICD-10-CM

## 2018-03-05 DIAGNOSIS — J479 Bronchiectasis, uncomplicated: Secondary | ICD-10-CM

## 2018-03-05 LAB — POCT URINALYSIS DIPSTICK
Bilirubin, UA: NEGATIVE
Blood, UA: NEGATIVE
Glucose, UA: NEGATIVE
Ketones, UA: NEGATIVE
NITRITE UA: NEGATIVE
Protein, UA: NEGATIVE
Spec Grav, UA: 1.01 (ref 1.010–1.025)
Urobilinogen, UA: 0.2 E.U./dL
pH, UA: 7.5 (ref 5.0–8.0)

## 2018-03-05 IMAGING — DX DG CHEST 2V
2 series · 2 of 2 positions shown · non-contrast
Comparison: [DATE]

CLINICAL DATA: Follow-up bronchiectasis

EXAM:
CHEST - 2 VIEW

[chest pa]
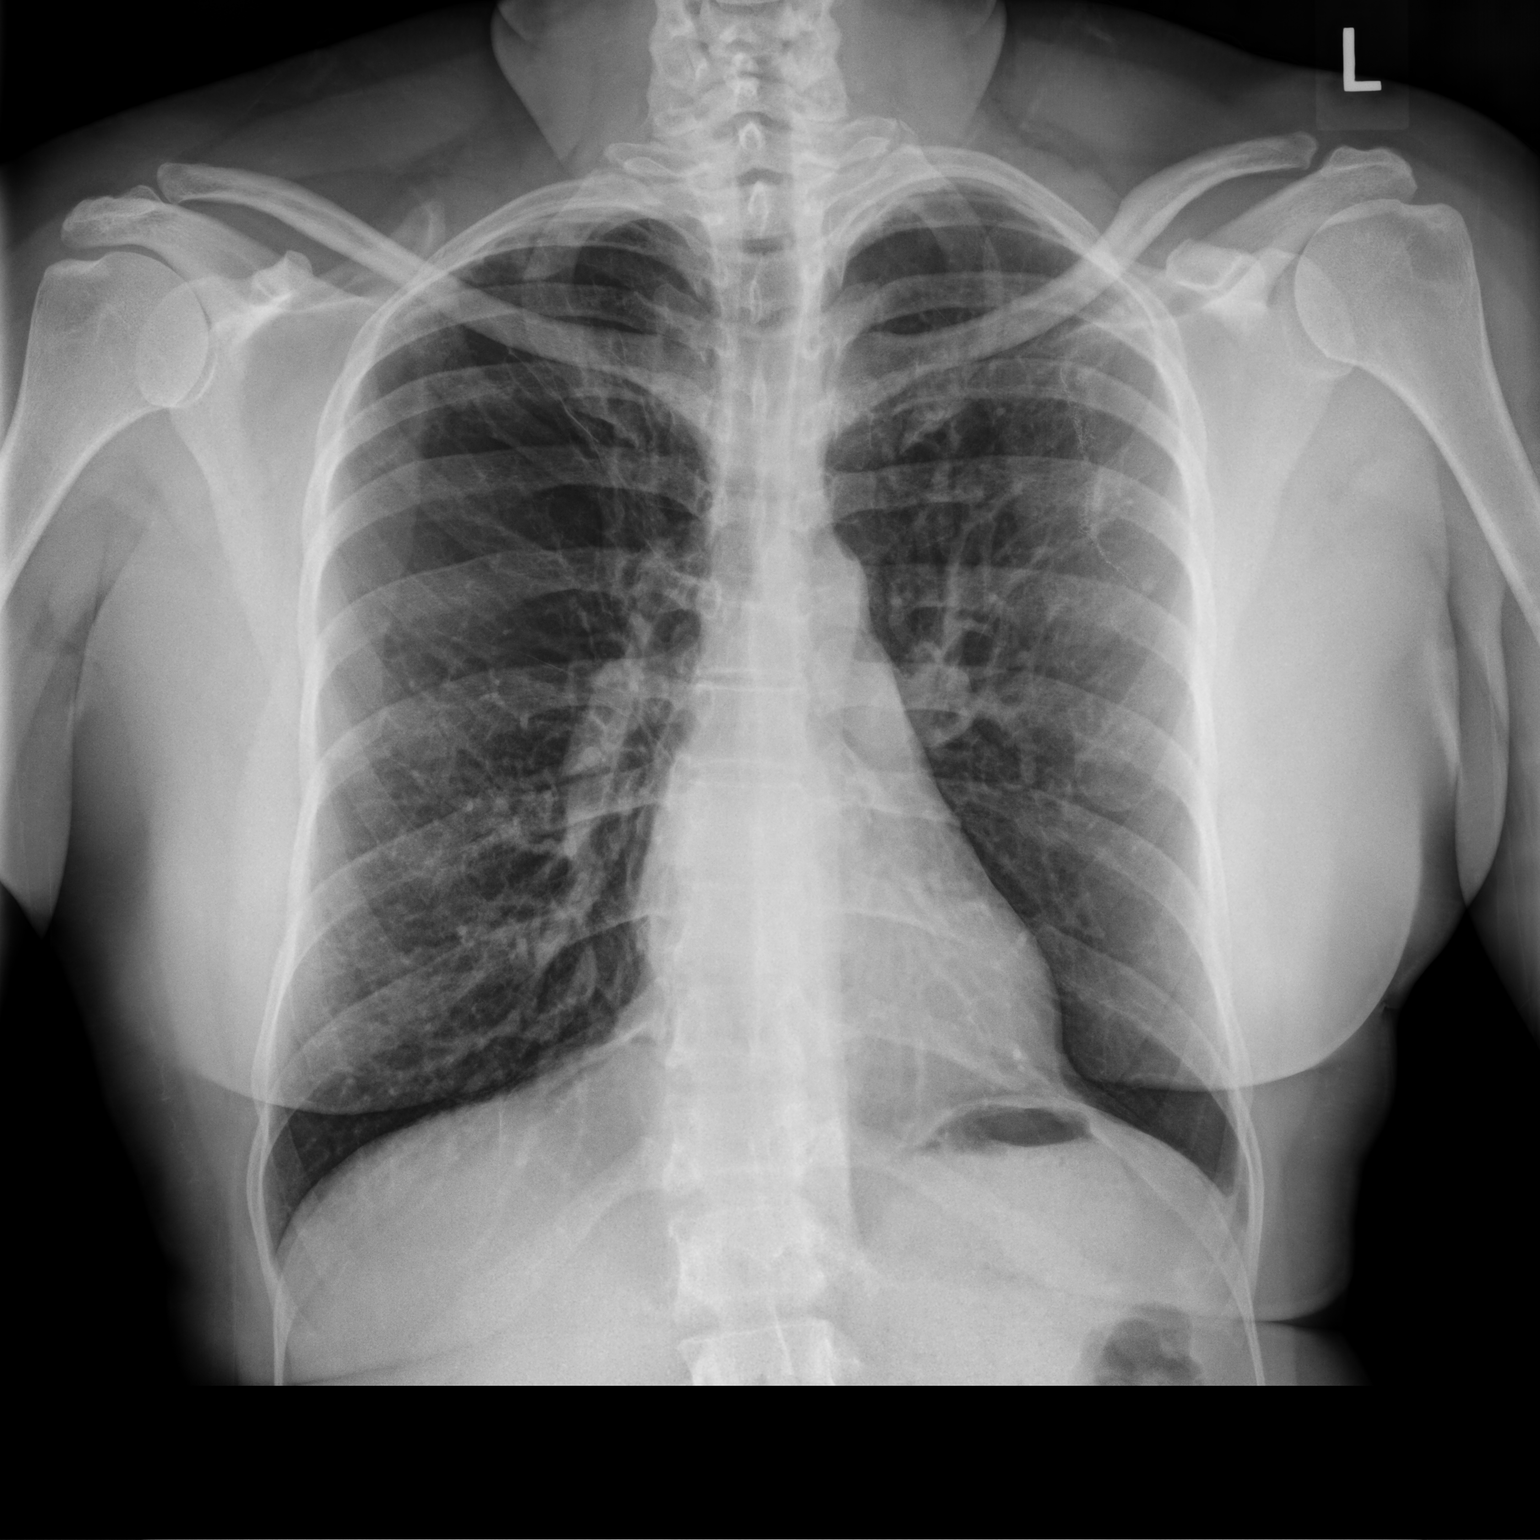

[chest lat]
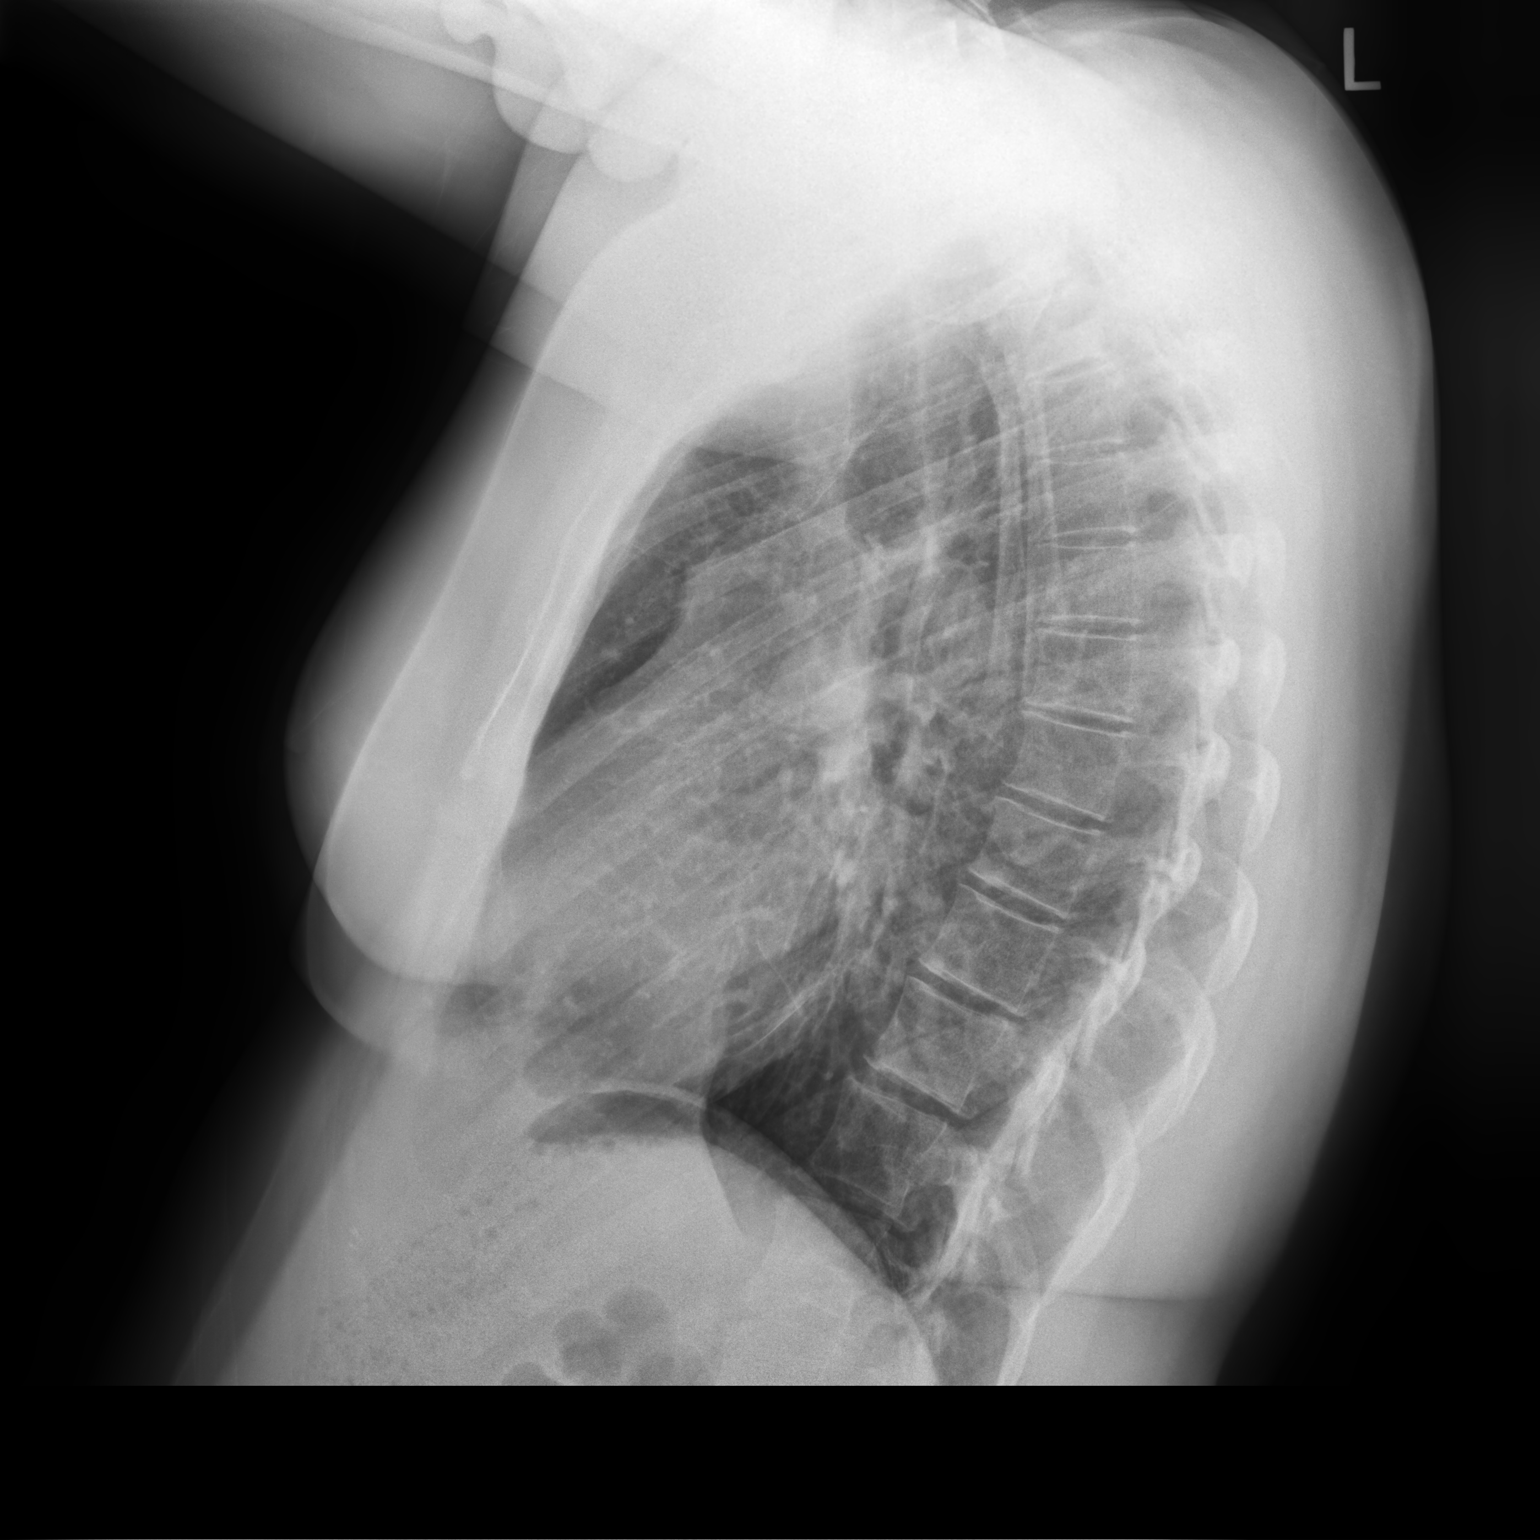

[2 of 2 positions shown; findings below may reference images not displayed]

FINDINGS: Cardiac shadow is stable. Postsurgical changes are again seen in the
left upper lobe. Biapical pleuroparenchymal scarring is seen. No
focal infiltrate or sizable effusion is noted. No acute bony
abnormality is seen. No bony abnormality is seen.
IMPRESSION: Chronic changes without acute abnormality.

## 2018-03-05 MED ORDER — NITROFURANTOIN MONOHYD MACRO 100 MG PO CAPS: 100.0000 mg | ORAL_CAPSULE | Freq: Two times a day (BID) | ORAL | 0 refills | Status: DC

## 2018-03-05 MED ORDER — PREDNISONE 10 MG PO TABS: ORAL_TABLET | ORAL | 0 refills | Status: DC

## 2018-03-05 MED ORDER — AZITHROMYCIN 250 MG PO TABS: ORAL_TABLET | ORAL | 0 refills | Status: DC

## 2018-03-05 NOTE — Patient Instructions (Signed)
Take nitrofurantoin as prescribed  I will let you know when the urine culture is back   Dysuria Dysuria is pain or discomfort while urinating. The pain or discomfort may be felt in the part of your body that drains urine from the bladder (urethra) or in the surrounding tissue of the genitals. The pain may also be felt in the groin area, lower abdomen, or lower back. You may have to urinate frequently or have the sudden feeling that you have to urinate (urgency). Dysuria can affect both men and women, but it is more common in women. Dysuria can be caused by many different things, including:  Urinary tract infection.  Kidney stones or bladder stones.  Certain sexually transmitted infections (STIs), such as chlamydia.  Dehydration.  Inflammation of the tissues of the vagina.  Use of certain medicines.  Use of certain soaps or scented products that cause irritation. Follow these instructions at home: General instructions  Watch your condition for any changes.  Urinate often. Avoid holding urine for long periods of time.  After a bowel movement or urination, women should cleanse from front to back, using each tissue only once.  Urinate after sexual intercourse.  Keep all follow-up visits as told by your health care provider. This is important.  If you had any tests done to find the cause of dysuria, it is up to you to get your test results. Ask your health care provider, or the department that is doing the test, when your results will be ready. Eating and drinking   Drink enough fluid to keep your urine pale yellow.  Avoid caffeine, tea, and alcohol. They can irritate the bladder and make dysuria worse. In men, alcohol may irritate the prostate. Medicines  Take over-the-counter and prescription medicines only as told by your health care provider.  If you were prescribed an antibiotic medicine, take it as told by your health care provider. Do not stop taking the antibiotic  even if you start to feel better. Contact a health care provider if:  You have a fever.  You develop pain in your back or sides.  You have nausea or vomiting.  You have blood in your urine.  You are not urinating as often as you usually do. Get help right away if:  Your pain is severe and not relieved with medicines.  You cannot eat or drink without vomiting.  You are confused.  You have a rapid heartbeat while at rest.  You have shaking or chills.  You feel extremely weak. Summary  Dysuria is pain or discomfort while urinating. Many different conditions can lead to dysuria.  If you have dysuria, you may have to urinate frequently or have the sudden feeling that you have to urinate (urgency).  Watch your condition for any changes. Keep all follow-up visits as told by your health care provider.  Make sure that you urinate often and drink enough fluid to keep your urine pale yellow. This information is not intended to replace advice given to you by your health care provider. Make sure you discuss any questions you have with your health care provider. Document Released: 09/25/2003 Document Revised: 10/13/2016 Document Reviewed: 10/13/2016 Elsevier Interactive Patient Education  2019 [employer].

## 2018-03-05 NOTE — Progress Notes (Addendum)
Hannah Barron    [id number]    11-Oct-1970  Primary Care Physician:Shambley, Delphia Grates, NP  Referring Physician: Lance Sell, NP [address], [place] [phone]  Chief complaint:  Follow up for  Cystic bronchiectasis Acute visit for bronchiectasis flare  HPI: 48 year old with chronic obstructive lung disease, bronchiectasis. She was previously followed at [place] with a VATS biopsy in 2003 and was told she had cystic bronchiectasis. She had followed up with a pulmonologist at [facility] but has moved to [place] on 2017. We have been unable to get records from [place] in spite of multiple attempts.  She is an immigrant from [place] in 1998 and does not report any exposure to tuberculosis. She does not recall getting a BCG vaccine as a child. Underwent a bronchoscopy to evaluate for MAI infection and a positive quantiferon as she was unable to give a good sputum sample. All results are negative except for positive PJP on DFA. She underwent further evaluation with negative HIV and beta glucan tests.  The PJP test was thought to be false positive She had a hysterectomy for uterine fibroids  On 3/27  Interim history: Complains of chest tightness for 1 week, chest congestion with increased shortness of breath.  She is using her albuterol rescue inhaler more often Has chronic cough with mucus production.  Denies any fevers, chills.  Outpatient Encounter Medications as of 03/05/2018  Medication Sig  . albuterol (PROVENTIL HFA;VENTOLIN HFA) 108 (90 Base) MCG/ACT inhaler INHALE 2 PUFFS INTO THE LUNGS EVERY 6 HOURS AS NEEDED FOR WHEEZING OR SHORTNESS OF BREATH  . budesonide-formoterol (SYMBICORT) 160-4.5 MCG/ACT inhaler Inhale 2 puffs into the lungs 2 (two) times daily.  Marland Kitchen CLARAVIS 40 MG capsule Take 2 capsules by mouth daily.  . clotrimazole (GYNE-LOTRIMIN) 1 % vaginal cream Apply to affected areas twice a day  . dicyclomine (BENTYL) 20 MG  tablet TAKE 1 TABLET BY MOUTH DAILY AS NEEDED FOR SPASMS  . estradiol (CLIMARA - DOSED IN MG/24 HR) 0.05 mg/24hr patch Place 1 patch onto the skin twice weekly  . estradiol (VIVELLE-DOT) 0.05 MG/24HR patch   . metoprolol succinate (TOPROL-XL) 50 MG 24 hr tablet Take 1 tablet (50mg ) in morning and 1/2 tablet (25mg ) in evening. Ok to take additional 1/2 tablet as needed for palpitations or rapid HR  . nitrofurantoin, macrocrystal-monohydrate, (MACROBID) 100 MG capsule Take 1 capsule (100 mg total) by mouth 2 (two) times daily.  Marland Kitchen omeprazole (PRILOSEC) 40 MG capsule TAKE 1 CAPSULE BY MOUTH EVERY DAY  . Respiratory Therapy Supplies (FLUTTER) DEVI Use as directed.  . RESTASIS 0.05 % ophthalmic emulsion Instill 1 drop into both eyes two times a day   No facility-administered encounter medications on file as of 03/05/2018.    Physical Exam: Blood pressure 118/64, pulse 79, height 5\' 7"  (1.702 m), weight 180 lb 3.2 oz (81.7 kg), last menstrual period 03/31/2016, SpO2 100 %. Gen:      No acute distress HEENT:  EOMI, sclera anicteric Neck:     No masses; no thyromegaly Lungs:    Clear to auscultation bilaterally; normal respiratory effort CV:         Regular rate and rhythm; no murmurs Abd:      + bowel sounds; soft, non-tender; no palpable masses, no distension Ext:    No edema; adequate peripheral perfusion Skin:      Warm and dry; no rash Neuro: alert and oriented x  3 Psych: normal mood and affect  Data Reviewed: Imaging CT high resolution 07/16/15-areas of cystic and varicose bronchiectasis, extensive air trapping, multiple pulmonary nodules. CT chest 03/21/16-areas of bronchiectasis, air trapping and pulmonary nodules. Unchanged compared to 2017 CT angiogram 12/23/16-stable areas of bronchiectasis, air trapping and pulmonary nodules.  No pulmonary embolism. I have reviewed all images personally.  PFT  06/2015  FVC 2.45 (70%), FEV1 1.54 [56%), F/F 63, TLC 78%  03/16/16 FVC 2.29 (69%), FEV1  1.46 (54%), F/F 64  11/27/2017 FVC 2.37 [59%), FEV1 1.53 [48%), F/F 64, TLC 92% Severe obstruction   Labs 03/24/16 IgG 1477 IgA 134 IgM 168 IgE 6 ANA, CCP, RA- negative  HIV 04/01/16- Negative Beta D glucan 04/01/16- Negative  A1AT 03/16/16- 161, PIMM Quantiferon 03/16/16- Positive CF panel 03/25/15- negative for 97 mutations analyzed (report scanned)  Cardiac Myocardial perfusion study 12/2015 >neg  Ischemia , EF 55% Echo 10/2015 >EF 65%.   Bronchoscopy 03/29/16 Cultures, AFB, fungal cultures-negative to date PJP DFA-positive Cytology-no malignant cells, CD4: CD8 ratio-1.62 Cell count WBC-26, 26% lymphs, 50% neutrophils, 24% monocyte macrophage  Assessment:  Cystic bronchiectasis with exacerbation Treat with Z-Pak, prednisone taper starting at 40 mg.  Reduce dose by 10 mg every 3 days Get chest x-ray and sputum cultures for regular cultures, AFB and fungus  She's had workup which is negative for alpha-1 antitrypsin, immunoglobulin deficiency, autoimmune disease, cystic fibrosis and mycobacterial infections.    Continues on Symbicort, albuterol as needed Continue mucociliary clearance with flutter valve, Mucinex Completed pulmonary rehab.  She continues to be active at home PFTs reviewed with stable lung function.  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 Discussed INH therapy but defer as per pt wishes  Plan/Recommendations: - Z-Pak, prednisone - Sputum culture - Continue symbicort, albuterol - Flutter valve,  Mucinex  Marshell Garfinkel MD [facility] 03/05/2018, 5:02 PM  CC: Lance Sell, NP

## 2018-03-05 NOTE — Patient Instructions (Signed)
Sorry you not feeling well today We will give you a Z-Pak, prednisone taper starting at 40 mg.  Reduce dose by 10 mg every 3 days We will get a chest x-ray and sputum cups for regular cultures, AFB and fungus cultures Follow-up in 1 month.

## 2018-03-05 NOTE — Progress Notes (Signed)
Hannah Barron is a 48 y.o. female with the following history as recorded in EpicCare:  Patient Active Problem List   Diagnosis Date Noted  . Cerumen impaction 09/07/2017  . Acne vulgaris 12/28/2016  . Bronchiectasis with acute exacerbation ([place]) 12/22/2016  . Medicare annual wellness visit, subsequent 04/28/2016  . Encounter for general adult medical examination with abnormal findings 04/28/2016  . Fibroids, intramural 04/01/2016  . Abnormal uterine bleeding (AUB) 04/01/2016  . Lung nodules 03/16/2016  . Palpitations   . GERD (gastroesophageal reflux disease) 07/30/2015  . Bronchiectasis without acute exacerbation ([place]) 07/08/2015  . COPD (chronic obstructive pulmonary disease) ([place]) 06/04/2015  . Fatigue 06/04/2015    Current Outpatient Medications  Medication Sig Dispense Refill  . albuterol (PROVENTIL HFA;VENTOLIN HFA) 108 (90 Base) MCG/ACT inhaler INHALE 2 PUFFS INTO THE LUNGS EVERY 6 HOURS AS NEEDED FOR WHEEZING OR SHORTNESS OF BREATH 54 Inhaler 3  . budesonide-formoterol (SYMBICORT) 160-4.5 MCG/ACT inhaler Inhale 2 puffs into the lungs 2 (two) times daily. 1 Inhaler 5  . CLARAVIS 40 MG capsule Take 2 capsules by mouth daily.    . clotrimazole (GYNE-LOTRIMIN) 1 % vaginal cream Apply to affected areas twice a day 60 g 2  . dicyclomine (BENTYL) 20 MG tablet TAKE 1 TABLET BY MOUTH DAILY AS NEEDED FOR SPASMS 90 tablet 1  . estradiol (CLIMARA - DOSED IN MG/24 HR) 0.05 mg/24hr patch Place 1 patch onto the skin twice weekly 24 patch 2  . estradiol (VIVELLE-DOT) 0.05 MG/24HR patch     . metoprolol succinate (TOPROL-XL) 50 MG 24 hr tablet Take 1 tablet (50mg ) in morning and 1/2 tablet (25mg ) in evening. Ok to take additional 1/2 tablet as needed for palpitations or rapid HR 150 tablet 3  . omeprazole (PRILOSEC) 40 MG capsule TAKE 1 CAPSULE BY MOUTH EVERY DAY 90 capsule 0  . Respiratory Therapy Supplies (FLUTTER) DEVI Use as directed. 1 each 0  . RESTASIS 0.05 % ophthalmic emulsion Instill 1  drop into both eyes two times a day  2   No current facility-administered medications for this visit.     Allergies: Patient has no known allergies.  Past Medical History:  Diagnosis Date  . Anemia   . Bronchiectasis ([place])   . COPD (chronic obstructive pulmonary disease) ([place])   . GERD (gastroesophageal reflux disease)   . Migraines   . Paroxysmal SVT (supraventricular tachycardia) ([place])    a. diagnosed in 11/2015.  . Right leg numbness     Past Surgical History:  Procedure Laterality Date  . CESAREAN SECTION    . CYSTOSCOPY N/A 04/05/2016   Procedure: CYSTOSCOPY;  Surgeon: Lavonia Drafts, MD;  Location: [facility];  Service: Gynecology;  Laterality: N/A;  . LAPAROSCOPIC VAGINAL HYSTERECTOMY WITH SALPINGECTOMY Bilateral 04/05/2016   Procedure: LAPAROSCOPIC ASSISTED VAGINAL HYSTERECTOMY WITH SALPINGECTOMY;  Surgeon: Lavonia Drafts, MD;  Location: [facility];  Service: Gynecology;  Laterality: Bilateral;  . LUNG BIOPSY    . VIDEO BRONCHOSCOPY Bilateral 03/29/2016   Procedure: VIDEO BRONCHOSCOPY WITHOUT FLUORO;  Surgeon: Marshell Garfinkel, MD;  Location: [facility];  Service: Cardiopulmonary;  Laterality: Bilateral;    Family History  Problem Relation Age of Onset  . Healthy Mother   . Healthy Father     Social History   Tobacco Use  . Smoking status: Never Smoker  . Smokeless tobacco: Never Used  Substance Use Topics  . Alcohol use: No    Alcohol/week: 0.0 standard drinks     Subjective:  Hannah Barron is here today for an  acute visit, CC: Dysuria x 2 weeks. She also c/o chills, urinary frequency, dark foul smelling urine, right flank /lower back pain. No weakness, syncope, abdominal pain, nausea, vomiting, hematuria, vaginal discharge, bowel changes. She tried to go see her GYN last week for her symptoms but their office was closed due to snow.  ROS- See HPI  Objective:  Vitals:   03/05/18 1344  BP: 110/60  Pulse: 85  SpO2: 97%  Weight: 180 lb (81.6 kg)  Height:  5\' 7"  (1.702 m)    General: Well developed, well nourished, in no acute distress  Skin : Warm and dry.  Head: Normocephalic and atraumatic  Eyes: Sclera and conjunctiva clear; pupils round and reactive to light; extraocular movements intact   Oropharynx: Pink, supple. No suspicious lesions  Neck: Supple Lungs: Respirations unlabored; clear to auscultation bilaterally without wheeze, rales, rhonchi  CVS exam: normal rate and regular rhythm, S1 and S2 normal.  Abdomen: Soft; nontender; nondistended; normoactive bowel sounds; no masses or hepatosplenomegaly; no CVA tenderness  Extremities: No edema, cyanosis, clubbing  Vessels: Symmetric bilaterally  Neurologic: Alert and oriented; speech intact; face symmetrical; moves all extremities well; CNII-XII intact without focal deficit  Psychiatric: Normal mood and affect.  Assessment:  1. Dysuria     Plan:   POCT urinalysis shows trace leukocytes today, no nitrites or blood Based on symptoms will start abx course for possible UTI- medication dosing, side effects discussed Home management, red flags and return precautions including when to seek immediate care discussed and printed on AVS Urine culture ordered- F/U with further recommendations pending results  No follow-ups on file.  Orders Placed This Encounter  Procedures  . Urine Culture    Standing Status:   Future    Standing Expiration Date:   04/03/2018  . POCT urinalysis dipstick    Requested Prescriptions    No prescriptions requested or ordered in this encounter

## 2018-03-06 LAB — URINE CULTURE
MICRO NUMBER:: 233198
SPECIMEN QUALITY:: ADEQUATE

## 2018-03-07 ENCOUNTER — Other Ambulatory Visit: Payer: Self-pay | Admitting: Nurse Practitioner

## 2018-03-07 MED ORDER — PENICILLIN V POTASSIUM 500 MG PO TABS: 500.0000 mg | ORAL_TABLET | Freq: Three times a day (TID) | ORAL | 0 refills | Status: DC

## 2018-03-12 ENCOUNTER — Other Ambulatory Visit: Payer: Self-pay | Admitting: Nurse Practitioner

## 2018-03-12 DIAGNOSIS — R899 Unspecified abnormal finding in specimens from other organs, systems and tissues: Secondary | ICD-10-CM

## 2018-03-16 ENCOUNTER — Other Ambulatory Visit (INDEPENDENT_AMBULATORY_CARE_PROVIDER_SITE_OTHER): Payer: Medicare HMO

## 2018-03-16 DIAGNOSIS — R899 Unspecified abnormal finding in specimens from other organs, systems and tissues: Secondary | ICD-10-CM | POA: Diagnosis not present

## 2018-03-16 LAB — COMPREHENSIVE METABOLIC PANEL
ALK PHOS: 71 U/L (ref 39–117)
ALT: 37 U/L — ABNORMAL HIGH (ref 0–35)
AST: 24 U/L (ref 0–37)
Albumin: 4.4 g/dL (ref 3.5–5.2)
BUN: 13 mg/dL (ref 6–23)
CALCIUM: 9.8 mg/dL (ref 8.4–10.5)
CO2: 23 mEq/L (ref 19–32)
Chloride: 103 mEq/L (ref 96–112)
Creatinine, Ser: 0.86 mg/dL (ref 0.40–1.20)
GFR: 70.42 mL/min (ref >60.00)
Glucose, Bld: 103 mg/dL — ABNORMAL HIGH (ref 70–99)
Potassium: 4 mEq/L (ref 3.5–5.1)
Sodium: 136 mEq/L (ref 135–145)
Total Bilirubin: 0.3 mg/dL (ref 0.2–1.2)
Total Protein: 7.9 g/dL (ref 6.0–8.3)

## 2018-03-16 LAB — LIPID PANEL
Cholesterol: 219 mg/dL — ABNORMAL HIGH (ref 0–200)
HDL: 56.1 mg/dL (ref >39.00)
LDL Cholesterol: 135 mg/dL — ABNORMAL HIGH (ref 0–99)
NonHDL: 162.6
Total CHOL/HDL Ratio: 4
Triglycerides: 140 mg/dL (ref 0.0–149.0)
VLDL: 28 mg/dL (ref 0.0–40.0)

## 2018-03-20 ENCOUNTER — Ambulatory Visit: Payer: Self-pay | Admitting: Gastroenterology

## 2018-04-04 ENCOUNTER — Ambulatory Visit: Payer: Self-pay | Admitting: Pulmonary Disease

## 2018-04-04 DIAGNOSIS — Z79899 Other long term (current) drug therapy: Secondary | ICD-10-CM | POA: Diagnosis not present

## 2018-04-04 DIAGNOSIS — Z23 Encounter for immunization: Secondary | ICD-10-CM | POA: Diagnosis not present

## 2018-04-04 DIAGNOSIS — L700 Acne vulgaris: Secondary | ICD-10-CM | POA: Diagnosis not present

## 2018-04-11 ENCOUNTER — Ambulatory Visit: Payer: Self-pay | Admitting: Gastroenterology

## 2018-05-02 DIAGNOSIS — L700 Acne vulgaris: Secondary | ICD-10-CM | POA: Diagnosis not present

## 2018-05-02 DIAGNOSIS — Z79899 Other long term (current) drug therapy: Secondary | ICD-10-CM | POA: Diagnosis not present

## 2018-05-29 ENCOUNTER — Other Ambulatory Visit: Payer: Self-pay | Admitting: Gastroenterology

## 2018-06-06 DIAGNOSIS — L819 Disorder of pigmentation, unspecified: Secondary | ICD-10-CM | POA: Diagnosis not present

## 2018-06-06 DIAGNOSIS — L700 Acne vulgaris: Secondary | ICD-10-CM | POA: Diagnosis not present

## 2018-06-06 DIAGNOSIS — Z79899 Other long term (current) drug therapy: Secondary | ICD-10-CM | POA: Diagnosis not present

## 2018-06-08 ENCOUNTER — Encounter: Payer: Medicare HMO | Admitting: Internal Medicine

## 2018-07-09 ENCOUNTER — Telehealth: Payer: Self-pay | Admitting: Family Medicine

## 2018-07-09 DIAGNOSIS — L700 Acne vulgaris: Secondary | ICD-10-CM | POA: Diagnosis not present

## 2018-07-09 DIAGNOSIS — Z79899 Other long term (current) drug therapy: Secondary | ICD-10-CM | POA: Diagnosis not present

## 2018-07-09 NOTE — Telephone Encounter (Signed)
Patient came to the office requesting to make her annual appt with [facility], I explained to the patient due to [place] she is only doing Virtual visits in our office, I offered her the [facility] office because she is seeing some patients in the office there, patient understood.

## 2018-08-07 ENCOUNTER — Other Ambulatory Visit: Payer: Self-pay | Admitting: Obstetrics & Gynecology

## 2018-08-07 DIAGNOSIS — N951 Menopausal and female climacteric states: Secondary | ICD-10-CM

## 2018-08-07 MED ORDER — ESTRADIOL 0.05 MG/24HR TD PTWK: MEDICATED_PATCH | TRANSDERMAL | 9 refills | Status: DC

## 2018-08-09 ENCOUNTER — Other Ambulatory Visit: Payer: Self-pay

## 2018-08-09 ENCOUNTER — Ambulatory Visit: Payer: Medicare HMO | Admitting: Pulmonary Disease

## 2018-08-09 ENCOUNTER — Encounter: Payer: Self-pay | Admitting: Pulmonary Disease

## 2018-08-09 VITALS — BP 112/62 | HR 88 | Temp 98.0°F | Ht 66.5 in | Wt 176.8 lb

## 2018-08-09 DIAGNOSIS — J479 Bronchiectasis, uncomplicated: Secondary | ICD-10-CM

## 2018-08-09 NOTE — Addendum Note (Signed)
Addended by: Hildred Alamin I on: 08/09/2018 11:03 AM   Modules accepted: Orders

## 2018-08-09 NOTE — Progress Notes (Signed)
Hannah Barron    [id number]    [birth year]  Primary Care Physician:Patient, No Pcp Per  Referring Physician: Lance Sell, NP No address on file  Chief complaint:  Follow up for  Cystic bronchiectasis  HPI: 48 year old with chronic obstructive lung disease, bronchiectasis. She was previously followed at [place] with a VATS biopsy in 2003 and was told she had cystic bronchiectasis. She had followed up with a pulmonologist at [facility] but has moved to [place] on 2017. We have been unable to get records from [place] in spite of multiple attempts.  She is an immigrant from [place] in 1998 and does not report any exposure to tuberculosis. She does not recall getting a BCG vaccine as a child. Underwent a bronchoscopy to evaluate for MAI infection and a positive quantiferon as she was unable to give a good sputum sample. All results are negative except for positive PJP on DFA. She underwent further evaluation with negative HIV and beta glucan tests.  The PJP test was thought to be false positive She had a hysterectomy for uterine fibroids  On 3/27  Interim history: Seen at last visit for bronchiectasis exacerbation which was treated with Z-Pak and prednisone States that she is improved back to baseline Still has occasional cough with mucus production Using her Symbicort.  Needs albuterol about every other day  Outpatient Encounter Medications as of 08/09/2018  Medication Sig  . albuterol (PROVENTIL HFA;VENTOLIN HFA) 108 (90 Base) MCG/ACT inhaler INHALE 2 PUFFS INTO THE LUNGS EVERY 6 HOURS AS NEEDED FOR WHEEZING OR SHORTNESS OF BREATH  . azithromycin (ZITHROMAX) 250 MG tablet Take two today and then one daily until finished  . budesonide-formoterol (SYMBICORT) 160-4.5 MCG/ACT inhaler Inhale 2 puffs into the lungs 2 (two) times daily.  Marland Kitchen CLARAVIS 40 MG capsule Take 2 capsules by mouth daily.  . clotrimazole (GYNE-LOTRIMIN) 1 % vaginal cream Apply to affected areas  twice a day  . dicyclomine (BENTYL) 20 MG tablet TAKE 1 TABLET BY MOUTH DAILY AS NEEDED FOR SPASMS  . estradiol (CLIMARA - DOSED IN MG/24 HR) 0.05 mg/24hr patch Place 1 patch onto the skin twice weekly  . metoprolol succinate (TOPROL-XL) 50 MG 24 hr tablet Take 1 tablet (50mg ) in morning and 1/2 tablet (25mg ) in evening. Ok to take additional 1/2 tablet as needed for palpitations or rapid HR  . omeprazole (PRILOSEC) 40 MG capsule TAKE 1 CAPSULE BY MOUTH EVERY DAY  . Respiratory Therapy Supplies (FLUTTER) DEVI Use as directed.  . RESTASIS 0.05 % ophthalmic emulsion Instill 1 drop into both eyes two times a day  . [DISCONTINUED] penicillin v potassium (VEETID) 500 MG tablet Take 1 tablet (500 mg total) by mouth 3 (three) times daily.  . [DISCONTINUED] predniSONE (DELTASONE) 10 MG tablet Take 4tabsx3days,3tabsx3days,2tabsx3days,1tabx3days,then stop.   No facility-administered encounter medications on file as of 08/09/2018.    Physical Exam: Blood pressure 112/62, pulse 88, temperature 98 F (36.7 C), temperature source Oral, height 5' 6.5" (1.689 m), weight 176 lb 12.8 oz (80.2 kg), last menstrual period 03/31/2016, SpO2 98 %. Gen:      No acute distress HEENT:  EOMI, sclera anicteric Neck:     No masses; no thyromegaly Lungs:    Clear to auscultation bilaterally; normal respiratory effort CV:         Regular rate and rhythm; no murmurs Abd:      + bowel sounds; soft, non-tender; no palpable masses, no distension Ext:  No edema; adequate peripheral perfusion Skin:      Warm and dry; no rash Neuro: alert and oriented x 3 Psych: normal mood and affect  Data Reviewed: Imaging CT high resolution 07/16/15-areas of cystic and varicose bronchiectasis, extensive air trapping, multiple pulmonary nodules. CT chest 03/21/16-areas of bronchiectasis, air trapping and pulmonary nodules. Unchanged compared to 2017 CT angiogram 12/23/16-stable areas of bronchiectasis, air trapping and pulmonary nodules.  No  pulmonary embolism. Chest x-ray 2/24- chronic biapical scarring.  Postsurgical changes in the left upper lobe.  I have reviewed the images personally. I have reviewed all images personally.  PFT  06/2015  FVC 2.45 (70%), FEV1 1.54 [56%), F/F 63, TLC 78%  03/16/16 FVC 2.29 (69%), FEV1 1.46 (54%), F/F 64  11/27/2017 FVC 2.37 [59%), FEV1 1.53 [48%), F/F 64, TLC 92% Severe obstruction   Labs 03/24/16 IgG 1477 IgA 134 IgM 168 IgE 6 ANA, CCP, RA- negative  HIV 04/01/16- Negative Beta D glucan 04/01/16- Negative  A1AT 03/16/16- 161, PIMM Quantiferon 03/16/16- Positive CF panel 03/25/15- negative for 97 mutations analyzed (report scanned)  Cardiac Myocardial perfusion study 12/2015 >neg  Ischemia , EF 55% Echo 10/2015 >EF 65%.   Bronchoscopy 03/29/16 Cultures, AFB, fungal cultures-negative to date PJP DFA-positive Cytology-no malignant cells, CD4: CD8 ratio-1.62 Cell count WBC-26, 26% lymphs, 50% neutrophils, 24% monocyte macrophage  Assessment:  Cystic bronchiectasis She's had workup which is negative for alpha-1 antitrypsin, immunoglobulin deficiency, autoimmune disease, cystic fibrosis and mycobacterial infections.   Continues on Symbicort, albuterol as needed. Continue Mucinex. Since she has had some recent exacerbations we will order percussion vest for optimal mucociliary clearance  Completed pulmonary rehab.  She continues to be active at home PFTs reviewed with stable lung function.  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 Discussed INH therapy but defer as per pt wishes  Plan/Recommendations: - Order percussion vest - Continue symbicort, albuterol - Flutter valve,  Mucinex  Marshell Garfinkel MD [facility] 08/09/2018, 10:37 AM  CC: Lance Sell, NP

## 2018-08-09 NOTE — Patient Instructions (Signed)
Continue Symbicort and albuterol We will order a percussion vest for better clearance of mucus Follow-up in 6 months.

## 2018-08-13 DIAGNOSIS — Z79899 Other long term (current) drug therapy: Secondary | ICD-10-CM | POA: Diagnosis not present

## 2018-08-13 DIAGNOSIS — L700 Acne vulgaris: Secondary | ICD-10-CM | POA: Diagnosis not present

## 2018-08-21 ENCOUNTER — Other Ambulatory Visit: Payer: Self-pay | Admitting: Obstetrics & Gynecology

## 2018-08-21 ENCOUNTER — Other Ambulatory Visit: Payer: Self-pay | Admitting: Nurse Practitioner

## 2018-08-21 DIAGNOSIS — Z1231 Encounter for screening mammogram for malignant neoplasm of breast: Secondary | ICD-10-CM

## 2018-09-03 DIAGNOSIS — R69 Illness, unspecified: Secondary | ICD-10-CM | POA: Diagnosis not present

## 2018-09-04 ENCOUNTER — Encounter: Payer: Medicare HMO | Admitting: Internal Medicine

## 2018-09-11 DIAGNOSIS — Z79899 Other long term (current) drug therapy: Secondary | ICD-10-CM | POA: Diagnosis not present

## 2018-09-11 DIAGNOSIS — L819 Disorder of pigmentation, unspecified: Secondary | ICD-10-CM | POA: Diagnosis not present

## 2018-09-11 DIAGNOSIS — L700 Acne vulgaris: Secondary | ICD-10-CM | POA: Diagnosis not present

## 2018-09-15 ENCOUNTER — Other Ambulatory Visit: Payer: Self-pay | Admitting: Physician Assistant

## 2018-10-01 ENCOUNTER — Ambulatory Visit (INDEPENDENT_AMBULATORY_CARE_PROVIDER_SITE_OTHER): Payer: Medicare HMO | Admitting: Internal Medicine

## 2018-10-01 ENCOUNTER — Other Ambulatory Visit: Payer: Self-pay

## 2018-10-01 ENCOUNTER — Encounter: Payer: Self-pay | Admitting: Internal Medicine

## 2018-10-01 ENCOUNTER — Other Ambulatory Visit (INDEPENDENT_AMBULATORY_CARE_PROVIDER_SITE_OTHER): Payer: Medicare HMO

## 2018-10-01 VITALS — BP 122/82 | HR 91 | Temp 98.3°F | Ht 66.5 in | Wt 172.0 lb

## 2018-10-01 DIAGNOSIS — E611 Iron deficiency: Secondary | ICD-10-CM | POA: Diagnosis not present

## 2018-10-01 DIAGNOSIS — J449 Chronic obstructive pulmonary disease, unspecified: Secondary | ICD-10-CM | POA: Diagnosis not present

## 2018-10-01 DIAGNOSIS — E559 Vitamin D deficiency, unspecified: Secondary | ICD-10-CM

## 2018-10-01 DIAGNOSIS — E785 Hyperlipidemia, unspecified: Secondary | ICD-10-CM

## 2018-10-01 DIAGNOSIS — G5601 Carpal tunnel syndrome, right upper limb: Secondary | ICD-10-CM

## 2018-10-01 DIAGNOSIS — R739 Hyperglycemia, unspecified: Secondary | ICD-10-CM

## 2018-10-01 DIAGNOSIS — S46811A Strain of other muscles, fascia and tendons at shoulder and upper arm level, right arm, initial encounter: Secondary | ICD-10-CM

## 2018-10-01 DIAGNOSIS — Z0000 Encounter for general adult medical examination without abnormal findings: Secondary | ICD-10-CM | POA: Diagnosis not present

## 2018-10-01 DIAGNOSIS — K2100 Gastro-esophageal reflux disease with esophagitis, without bleeding: Secondary | ICD-10-CM

## 2018-10-01 DIAGNOSIS — Z0001 Encounter for general adult medical examination with abnormal findings: Secondary | ICD-10-CM | POA: Diagnosis not present

## 2018-10-01 DIAGNOSIS — M5412 Radiculopathy, cervical region: Secondary | ICD-10-CM | POA: Diagnosis not present

## 2018-10-01 DIAGNOSIS — E538 Deficiency of other specified B group vitamins: Secondary | ICD-10-CM | POA: Diagnosis not present

## 2018-10-01 DIAGNOSIS — R002 Palpitations: Secondary | ICD-10-CM

## 2018-10-01 LAB — BASIC METABOLIC PANEL
BUN: 7 mg/dL (ref 6–23)
CO2: 27 mEq/L (ref 19–32)
Calcium: 10 mg/dL (ref 8.4–10.5)
Chloride: 103 mEq/L (ref 96–112)
Creatinine, Ser: 0.69 mg/dL (ref 0.40–1.20)
GFR: 90.59 mL/min (ref >60.00)
Glucose, Bld: 87 mg/dL (ref 70–99)
Potassium: 4.8 mEq/L (ref 3.5–5.1)
Sodium: 138 mEq/L (ref 135–145)

## 2018-10-01 LAB — CBC WITH DIFFERENTIAL/PLATELET
Basophils Absolute: 0 10*3/uL (ref 0.0–0.1)
Basophils Relative: 0.8 % (ref 0.0–3.0)
Eosinophils Absolute: 0.1 10*3/uL (ref 0.0–0.7)
Eosinophils Relative: 1.6 % (ref 0.0–5.0)
HCT: 38.6 % (ref 36.0–46.0)
Hemoglobin: 13 g/dL (ref 12.0–15.0)
Lymphocytes Relative: 35.1 % (ref 12.0–46.0)
Lymphs Abs: 1.9 10*3/uL (ref 0.7–4.0)
MCHC: 33.7 g/dL (ref 30.0–36.0)
MCV: 95 fl (ref 78.0–100.0)
Monocytes Absolute: 0.5 10*3/uL (ref 0.1–1.0)
Monocytes Relative: 9.6 % (ref 3.0–12.0)
Neutro Abs: 2.9 10*3/uL (ref 1.4–7.7)
Neutrophils Relative %: 52.9 % (ref 43.0–77.0)
Platelets: 284 10*3/uL (ref 150.0–400.0)
RBC: 4.06 Mil/uL (ref 3.87–5.11)
RDW: 13.2 % (ref 11.5–15.5)
WBC: 5.5 10*3/uL (ref 4.0–10.5)

## 2018-10-01 LAB — URINALYSIS, ROUTINE W REFLEX MICROSCOPIC
Bilirubin Urine: NEGATIVE
Hgb urine dipstick: NEGATIVE
Ketones, ur: NEGATIVE
Nitrite: NEGATIVE
RBC / HPF: NONE SEEN (ref 0–?)
Specific Gravity, Urine: 1.01 (ref 1.000–1.030)
Total Protein, Urine: NEGATIVE
Urine Glucose: NEGATIVE
Urobilinogen, UA: 0.2 (ref 0.0–1.0)
pH: 7 (ref 5.0–8.0)

## 2018-10-01 LAB — HEPATIC FUNCTION PANEL
ALT: 28 U/L (ref 0–35)
AST: 26 U/L (ref 0–37)
Albumin: 4.2 g/dL (ref 3.5–5.2)
Alkaline Phosphatase: 69 U/L (ref 39–117)
Bilirubin, Direct: 0 mg/dL (ref 0.0–0.3)
Total Bilirubin: 0.3 mg/dL (ref 0.2–1.2)
Total Protein: 7.8 g/dL (ref 6.0–8.3)

## 2018-10-01 LAB — VITAMIN B12: Vitamin B-12: 1063 pg/mL — ABNORMAL HIGH (ref 211–911)

## 2018-10-01 LAB — IBC PANEL
Iron: 85 ug/dL (ref 42–145)
Saturation Ratios: 21.3 % (ref 20.0–50.0)
Transferrin: 285 mg/dL (ref 212.0–360.0)

## 2018-10-01 LAB — TSH: TSH: 0.83 u[IU]/mL (ref 0.35–4.50)

## 2018-10-01 LAB — LIPID PANEL
Cholesterol: 186 mg/dL (ref 0–200)
HDL: 58.9 mg/dL (ref >39.00)
LDL Cholesterol: 114 mg/dL — ABNORMAL HIGH (ref 0–99)
NonHDL: 127.38
Total CHOL/HDL Ratio: 3
Triglycerides: 69 mg/dL (ref 0.0–149.0)
VLDL: 13.8 mg/dL (ref 0.0–40.0)

## 2018-10-01 LAB — VITAMIN D 25 HYDROXY (VIT D DEFICIENCY, FRACTURES): VITD: 37.16 ng/mL (ref 30.00–100.00)

## 2018-10-01 LAB — HEMOGLOBIN A1C: Hgb A1c MFr Bld: 6.2 % (ref 4.6–6.5)

## 2018-10-01 MED ORDER — CYCLOBENZAPRINE HCL 5 MG PO TABS: 5.0000 mg | ORAL_TABLET | Freq: Three times a day (TID) | ORAL | 1 refills | Status: DC | PRN

## 2018-10-01 MED ORDER — OMEPRAZOLE 40 MG PO CPDR: DELAYED_RELEASE_CAPSULE | ORAL | 3 refills | Status: DC

## 2018-10-01 MED ORDER — ATORVASTATIN CALCIUM 20 MG PO TABS: 20.0000 mg | ORAL_TABLET | Freq: Every day | ORAL | 3 refills | Status: DC

## 2018-10-01 NOTE — Assessment & Plan Note (Signed)
Due for card f/u, will refer

## 2018-10-01 NOTE — Patient Instructions (Signed)
Please take all new medication as prescribed - the omprazole 40 mg per day., and the generic flexeril as needed for muscle pain, and generic lipitor 20 mg per cholesterol  Please continue all other medications as before, and refills have been done if requested.  Please have the pharmacy call with any other refills you may need.  Please continue your efforts at being more active, low cholesterol diet, and weight control.  You are otherwise up to date with prevention measures today.  Please keep your appointments with your specialists as you may have planned  You will be contacted regarding the referral for: Cardiology, as well as MRI for the neck and Neurosurgury  Please continue to use the right wrist splint at night as well if this helps  Please go to the LAB in the Basement (turn left off the elevator) for the tests to be done today  You will be contacted by phone if any changes need to be made immediately.  Otherwise, you will receive a letter about your results with an explanation, but please check with MyChart first.  Please remember to sign up for MyChart if you have not done so, as this will be important to you in the future with finding out test results, communicating by private email, and scheduling acute appointments online when needed.  Please return in 6 months, or sooner if needed

## 2018-10-01 NOTE — Assessment & Plan Note (Signed)
Possible, to cont right wrist splint for now, consider NCS/ EMG

## 2018-10-01 NOTE — Assessment & Plan Note (Signed)
For f/u lab, may need further replacement 

## 2018-10-01 NOTE — Progress Notes (Signed)
Subjective:    Patient ID: Hannah Barron, female    DOB: 03/10/1970, 48 y.o.   MRN: [id number]  HPI  Here for wellness and f/u;  Overall doing ok;  Pt denies Chest pain, worsening SOB, DOE, wheezing, orthopnea, PND, worsening LE edema, dizziness or syncope.  Pt denies polydipsia, polyuria, or low sugar symptoms. Pt states overall good compliance with treatment and medications, good tolerability, and has been trying to follow appropriate diet.  Pt denies worsening depressive symptoms, suicidal ideation or panic. No fever, night sweats, wt loss, loss of appetite, or other constitutional symptoms.  Pt states good ability with ADL's, has low fall risk, home safety reviewed and adequate, no other significant changes in hearing or vision, and only occasionally active with exercise.  See pulm Dr Vaughan Browner. Also with c/o intermittent palpitations, due for card f/u, delayed due to pandemic. Also c/o right neck pain with radiation and swe  lling of the muscle to the right upper back, as well as right shoulder and arm pain with numbness and weakness to the right grip and dropping things, left handed, but also says right hand numbness may be worse in the AM, somewhat better with the right wrist splint.    Aso c/o 1 mo worsening reflux, without abd pain, dysphagia, n/v, bowel change or blood. Past Medical History:  Diagnosis Date  . Anemia   . Bronchiectasis ([place])   . COPD (chronic obstructive pulmonary disease) ([place])   . GERD (gastroesophageal reflux disease)   . Migraines   . Paroxysmal SVT (supraventricular tachycardia) ([place])    a. diagnosed in 11/2015.  . Right leg numbness    Past Surgical History:  Procedure Laterality Date  . CESAREAN SECTION    . CYSTOSCOPY N/A 04/05/2016   Procedure: CYSTOSCOPY;  Surgeon: Lavonia Drafts, MD;  Location: [facility];  Service: Gynecology;  Laterality: N/A;  . LAPAROSCOPIC VAGINAL HYSTERECTOMY WITH SALPINGECTOMY Bilateral 04/05/2016   Procedure: LAPAROSCOPIC ASSISTED  VAGINAL HYSTERECTOMY WITH SALPINGECTOMY;  Surgeon: Lavonia Drafts, MD;  Location: [facility];  Service: Gynecology;  Laterality: Bilateral;  . LUNG BIOPSY    . VIDEO BRONCHOSCOPY Bilateral 03/29/2016   Procedure: VIDEO BRONCHOSCOPY WITHOUT FLUORO;  Surgeon: Marshell Garfinkel, MD;  [facility]: [facility];  Service: Cardiopulmonary;  Laterality: Bilateral;    reports that she has never smoked. She has never used smokeless tobacco. She reports that she does not drink alcohol or use drugs. family history includes Healthy in her father and mother. No Known Allergies Current Outpatient Medications on File Prior to Visit  Medication Sig Dispense Refill  . albuterol (PROVENTIL HFA;VENTOLIN HFA) 108 (90 Base) MCG/ACT inhaler INHALE 2 PUFFS INTO THE LUNGS EVERY 6 HOURS AS NEEDED FOR WHEEZING OR SHORTNESS OF BREATH 54 Inhaler 3  . budesonide-formoterol (SYMBICORT) 160-4.5 MCG/ACT inhaler Inhale 2 puffs into the lungs 2 (two) times daily. 1 Inhaler 5  . CLARAVIS 40 MG capsule Take 2 capsules by mouth daily.    . clotrimazole (GYNE-LOTRIMIN) 1 % vaginal cream Apply to affected areas twice a day 60 g 2  . dicyclomine (BENTYL) 20 MG tablet TAKE 1 TABLET BY MOUTH DAILY AS NEEDED FOR SPASMS 90 tablet 1  . estradiol (CLIMARA - DOSED IN MG/24 HR) 0.05 mg/24hr patch Place 1 patch onto the skin twice weekly 24 patch 9  . metoprolol succinate (TOPROL-XL) 50 MG 24 hr tablet TAKE 1 TABLET IN MORNING & 1/2 TABLET IN EVENING *TAKE 1/2 TAB AS NEEDED FOR PALPITATIONS/RAPID HR* 45 tablet 0  . Respiratory Therapy  Supplies (FLUTTER) DEVI Use as directed. 1 each 0  . RESTASIS 0.05 % ophthalmic emulsion Instill 1 drop into both eyes two times a day  2   No current facility-administered medications on file prior to visit.    Review of Systems Constitutional: Negative for other unusual diaphoresis, sweats, appetite or weight changes HENT: Negative for other worsening hearing loss, ear pain, facial swelling, mouth sores or neck  stiffness.   Eyes: Negative for other worsening pain, redness or other visual disturbance.  Respiratory: Negative for other stridor or swelling Cardiovascular: Negative for other palpitations or other chest pain  Gastrointestinal: Negative for worsening diarrhea or loose stools, blood in stool, distention or other pain Genitourinary: Negative for hematuria, flank pain or other change in urine volume.  Musculoskeletal: Negative for myalgias or other joint swelling.  Skin: Negative for other color change, or other wound or worsening drainage.  Neurological: Negative for other syncope or numbness. Hematological: Negative for other adenopathy or swelling Psychiatric/Behavioral: Negative for hallucinations, other worsening agitation, SI, self-injury, or new decreased concentration All other system neg per pt    Objective:   Physical Exam BP 122/82   Pulse 91   Temp 98.3 F (36.8 C) (Oral)   Ht 5' 6.5" (1.689 m)   Wt 172 lb (78 kg)   LMP 03/31/2016   SpO2 97%   BMI 27.35 kg/m  VS noted,  Constitutional: Pt is oriented to person, place, and time. Appears well-developed and well-nourished, in no significant distress and comfortable Head: Normocephalic and atraumatic  Eyes: Conjunctivae and EOM are normal. Pupils are equal, round, and reactive to light Right Ear: External ear normal without discharge Left Ear: External ear normal without discharge Nose: Nose without discharge or deformity Mouth/Throat: Oropharynx is without other ulcerations and moist  Neck: Normal range of motion. Neck supple. No JVD present. No tracheal deviation present or significant neck LA or mass Cardiovascular: Normal rate, regular rhythm, normal heart sounds and intact distal pulses.   Pulmonary/Chest: WOB normal and breath sounds without rales or wheezing  Abdominal: Soft. Bowel sounds are normal. NT. No HSM  Musculoskeletal: Normal range of motion. Exhibits no edema Lymphadenopathy: Has no other cervical  adenopathy.  Right trapezoid tender, swelling without rash Neurological: Pt is alert and oriented to person, place, and time. Pt has normal reflexes. No cranial nerve deficit. Motor grossly intact except or RUE 4+/5, Gait intact Skin: Skin is warm and dry. No rash noted or new ulcerations Psychiatric:  Has normal mood and affect. Behavior is normal without agitation All otherwise neg per pt Lab Results  Component Value Date   WBC 5.5 10/01/2018   HGB 13.0 10/01/2018   HCT 38.6 10/01/2018   PLT 284.0 10/01/2018   GLUCOSE 87 10/01/2018   CHOL 186 10/01/2018   TRIG 69.0 10/01/2018   HDL 58.90 10/01/2018   LDLCALC 114 (H) 10/01/2018   ALT 28 10/01/2018   AST 26 10/01/2018   NA 138 10/01/2018   K 4.8 10/01/2018   CL 103 10/01/2018   CREATININE 0.69 10/01/2018   BUN 7 10/01/2018   CO2 27 10/01/2018   TSH 0.83 10/01/2018   INR 1.1 10/27/2014   HGBA1C 6.2 10/01/2018      Assessment & Plan:

## 2018-10-01 NOTE — Assessment & Plan Note (Signed)

## 2018-10-01 NOTE — Assessment & Plan Note (Signed)
Uncontrolled, and with hx of severe copd will start lipitor 20 qd to help reduce cardiac risk as well

## 2018-10-01 NOTE — Assessment & Plan Note (Signed)
stable overall by history and exam, recent data reviewed with pt, and pt to continue medical treatment as before,  to f/u any worsening symptoms or concerns  

## 2018-10-01 NOTE — Assessment & Plan Note (Signed)
Uncontrolled, to change otc 20 mg to prilosec 40 mg

## 2018-10-01 NOTE — Assessment & Plan Note (Addendum)
Mild to mod, for MRI c spine, refer NS to f/u any worsening symptoms or concerns  In addition to the time spent performing CPE, I spent an additional 40 minutes face to face,in which greater than 50% of this time was spent in counseling and coordination of care for patient's acute illness as documented, including the differential dx, treatment, further evaluation and other management of right cervical radiculopathy. Trapezoid strain, right CTS, severe cOPD, hyperglycemia, HLD, GERD< VI d def and palps

## 2018-10-01 NOTE — Assessment & Plan Note (Signed)
For muscle relaxer prn 

## 2018-10-08 ENCOUNTER — Other Ambulatory Visit: Payer: Self-pay

## 2018-10-08 ENCOUNTER — Ambulatory Visit
Admission: RE | Admit: 2018-10-08 | Discharge: 2018-10-08 | Disposition: A | Discharge status: Home / Self Care | Payer: Medicare HMO | Source: Ambulatory Visit | Attending: Obstetrics & Gynecology | Admitting: Obstetrics & Gynecology

## 2018-10-08 DIAGNOSIS — Z1231 Encounter for screening mammogram for malignant neoplasm of breast: Secondary | ICD-10-CM

## 2018-10-08 IMAGING — MG MM DIGITAL SCREENING BILAT W/ TOMO W/ CAD
8 series · 8 of 24 positions shown · non-contrast
Comparison: Previous exam(s).

CLINICAL DATA: Screening.

EXAM:
DIGITAL SCREENING BILATERAL MAMMOGRAM WITH TOMO AND CAD

[L MLO synth-2D]
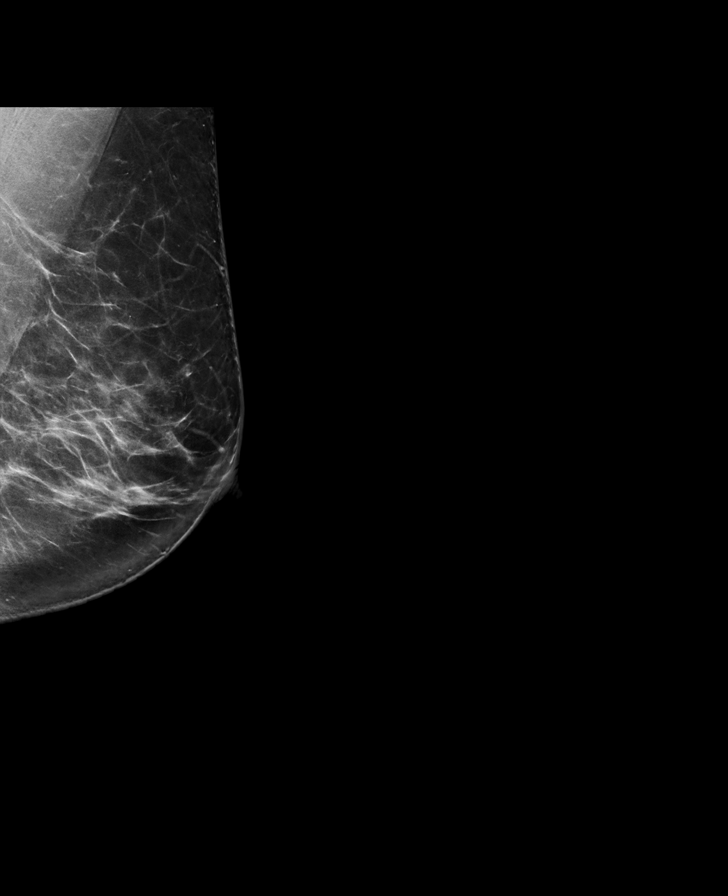

[L CC synth-2D]
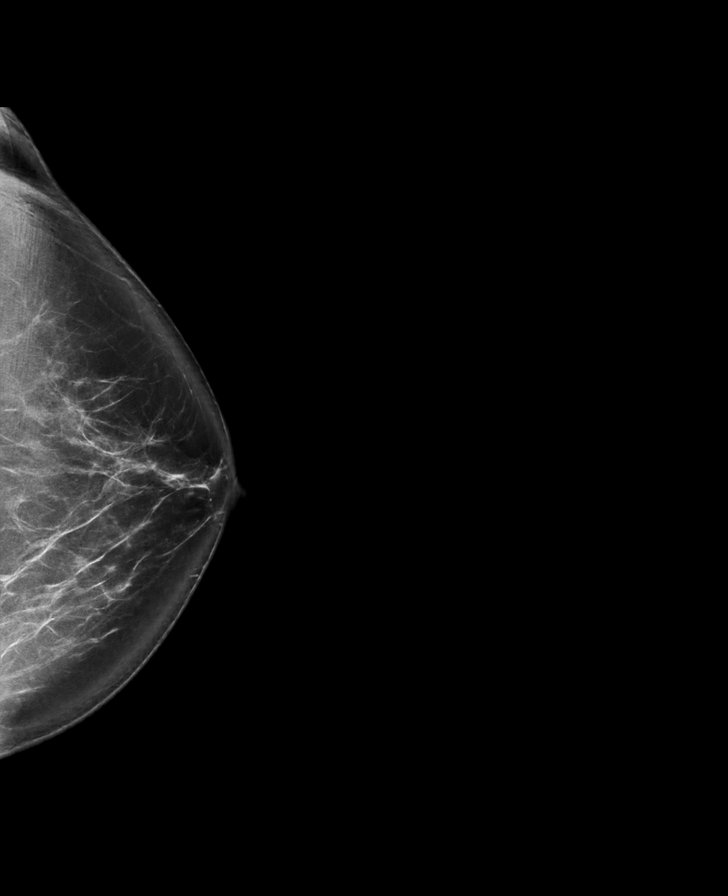

[R CC synth-2D]
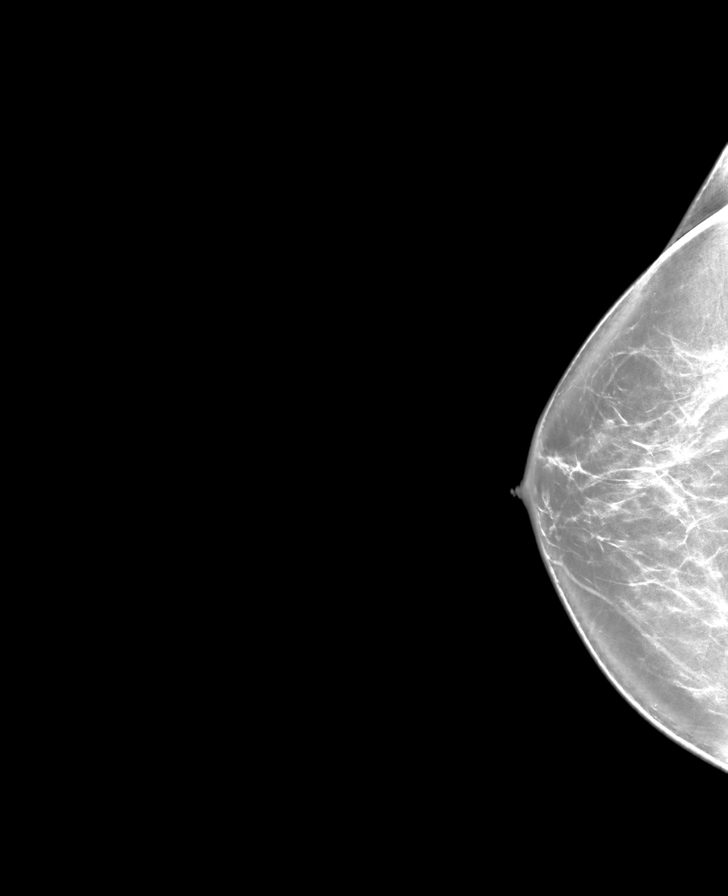

[R MLO synth-2D]
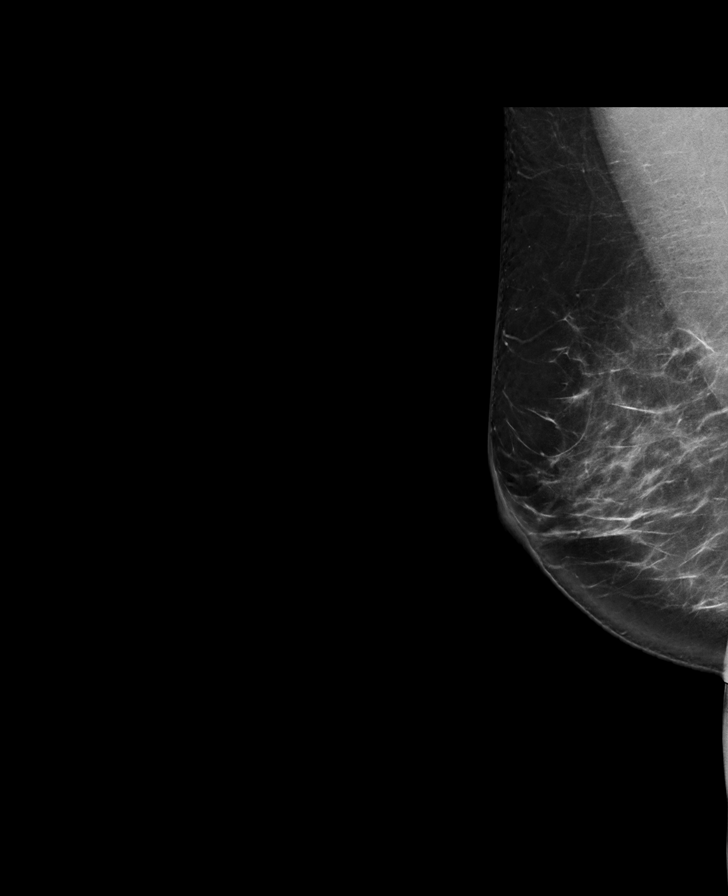

[L CC tomo · tomo slice 44/87.0]
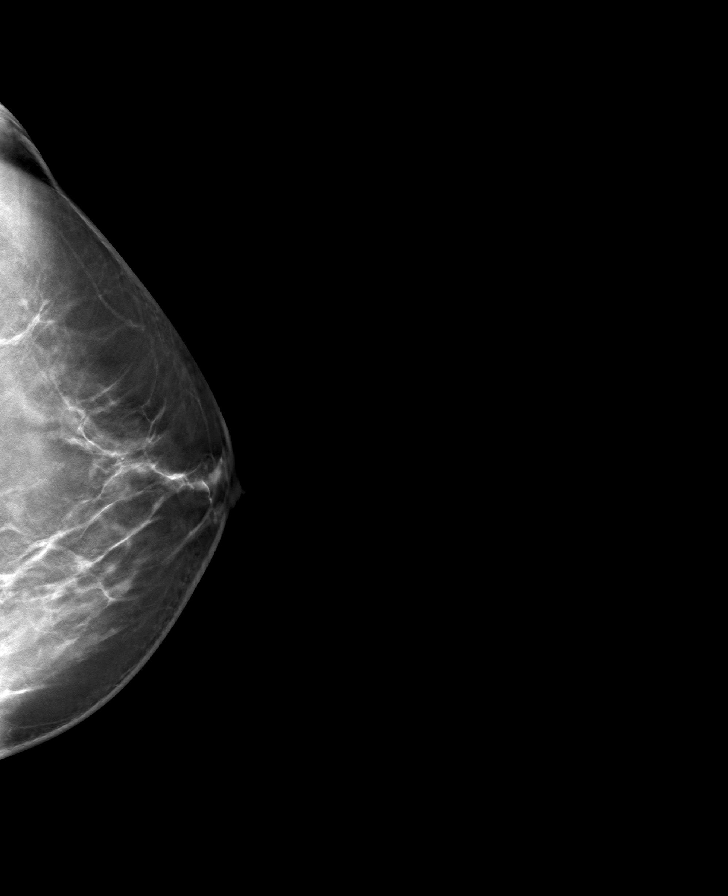

[R CC tomo · tomo slice 41/80.0]
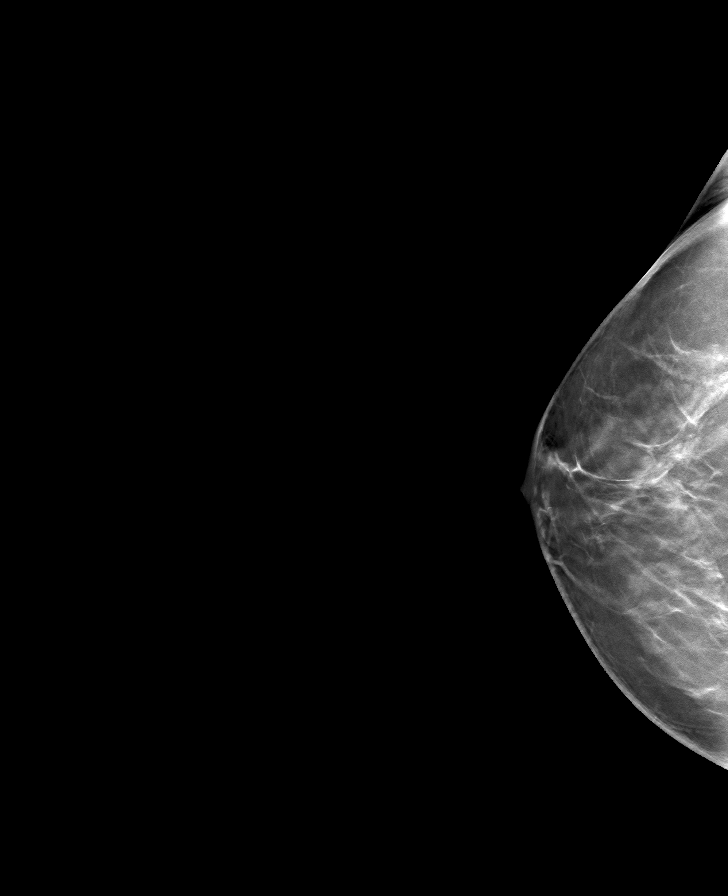

[R MLO tomo · tomo slice 43/86.0]
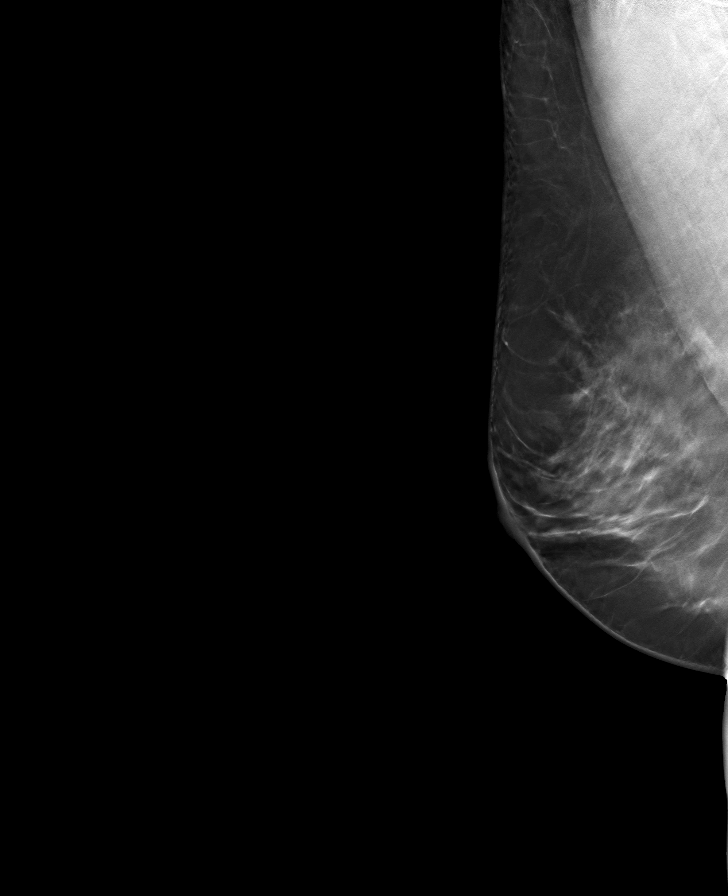

[L MLO tomo · tomo slice 41/82.0]
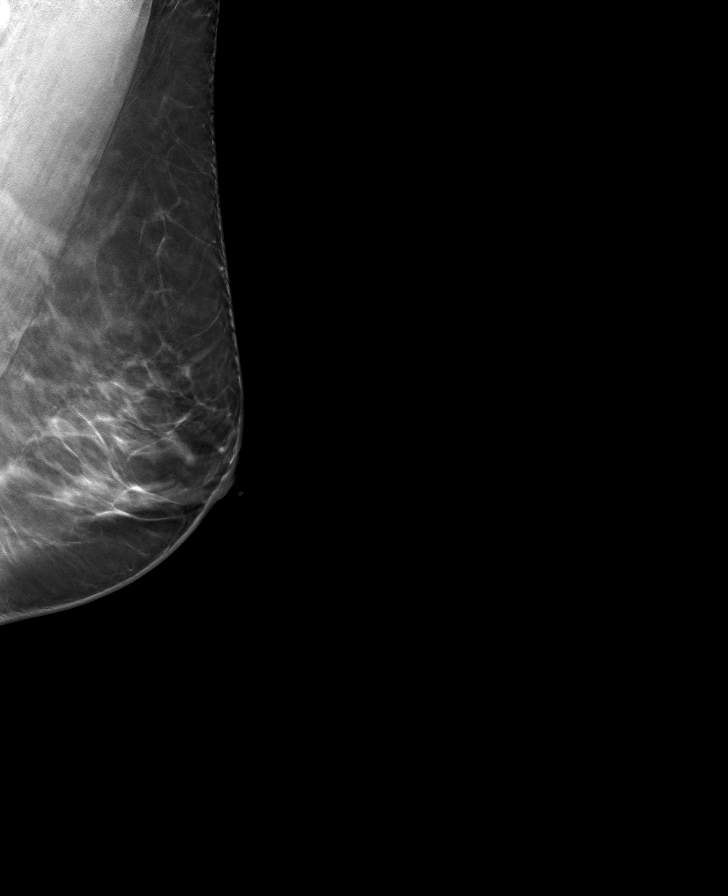

[8 of 24 positions shown; findings below may reference images not displayed]

ACR Breast Density Category b: There are scattered areas of
fibroglandular density.
FINDINGS: There are no findings suspicious for malignancy. Images were
processed with CAD.
IMPRESSION: No mammographic evidence of malignancy. A result letter of this
screening mammogram will be mailed directly to the patient.

RECOMMENDATION:
Screening mammogram in one year. (Code:[TQ])

BI-RADS CATEGORY  1: Negative.

## 2018-10-10 DIAGNOSIS — M542 Cervicalgia: Secondary | ICD-10-CM | POA: Diagnosis not present

## 2018-10-11 ENCOUNTER — Telehealth: Payer: Self-pay | Admitting: Internal Medicine

## 2018-10-11 NOTE — Telephone Encounter (Signed)
Please call pt to go over her labs with her.   thanks

## 2018-10-11 NOTE — Telephone Encounter (Signed)
Called pt, LVM.   

## 2018-10-15 ENCOUNTER — Other Ambulatory Visit: Payer: Self-pay | Admitting: Physician Assistant

## 2018-10-23 ENCOUNTER — Other Ambulatory Visit: Payer: Medicare HMO

## 2018-11-01 ENCOUNTER — Ambulatory Visit: Payer: Medicare HMO

## 2018-11-08 ENCOUNTER — Other Ambulatory Visit: Payer: Self-pay

## 2018-11-08 ENCOUNTER — Ambulatory Visit: Payer: Medicare HMO | Attending: Physician Assistant

## 2018-11-08 DIAGNOSIS — R293 Abnormal posture: Secondary | ICD-10-CM | POA: Insufficient documentation

## 2018-11-08 DIAGNOSIS — M5412 Radiculopathy, cervical region: Secondary | ICD-10-CM | POA: Diagnosis not present

## 2018-11-08 NOTE — Therapy (Signed)
[place], [place], [address] Phone: [phone]   Fax:  [phone]  Physical Therapy Evaluation  Patient Details  Name: Hannah Barron MRN: [id number] Date of Birth: Dec 11, 1970 Referring Provider (PT): Ferne Reus PA   Encounter Date: 11/08/2018  PT End of Session - 11/08/18 1030    Visit Number  1    Number of Visits  9    Date for PT Re-Evaluation  12/13/18    PT Start Time  1022    PT Stop Time  1105    PT Time Calculation (min)  43 min    Activity Tolerance  Patient tolerated treatment well    Behavior During Therapy  [facility] for tasks assessed/performed       Past Medical History:  Diagnosis Date  . Anemia   . Bronchiectasis ([place])   . COPD (chronic obstructive pulmonary disease) ([place])   . GERD (gastroesophageal reflux disease)   . Migraines   . Paroxysmal SVT (supraventricular tachycardia) ([place])    a. diagnosed in 11/2015.  . Right leg numbness     Past Surgical History:  Procedure Laterality Date  . CESAREAN SECTION    . CYSTOSCOPY N/A 04/05/2016   Procedure: CYSTOSCOPY;  Surgeon: Lavonia Drafts, MD;  Location: [facility];  Service: Gynecology;  Laterality: N/A;  . LAPAROSCOPIC VAGINAL HYSTERECTOMY WITH SALPINGECTOMY Bilateral 04/05/2016   Procedure: LAPAROSCOPIC ASSISTED VAGINAL HYSTERECTOMY WITH SALPINGECTOMY;  Surgeon: Lavonia Drafts, MD;  Location: [facility];  Service: Gynecology;  Laterality: Bilateral;  . LUNG BIOPSY    . VIDEO BRONCHOSCOPY Bilateral 03/29/2016   Procedure: VIDEO BRONCHOSCOPY WITHOUT FLUORO;  Surgeon: Marshell Garfinkel, MD;  Location: [facility];  Service: Cardiopulmonary;  Laterality: Bilateral;    There were no vitals filed for this visit.   Subjective Assessment - 11/08/18 1026    Subjective  Pt reports that she has pain in the R side of her neck that radiate into her R shoulder and some pain in her R LB that makes her RLE numb at night but she  can currently feel her leg without difficulty. She states that the numbness is not every day it comes and goes but never happens during the day. It is not numb until she lays in bed. She lays on her L side propped up on 5 pillows becuase of her breathing problems. She states that when she sleeps on her R side propped on the pillows. She states that her cervical pain is always there radiating into her shoulder.    Pertinent History  COPD, Proxismal SVT    Limitations  Lifting    How long can you sit comfortably?  no limitations    How long can you stand comfortably?  no limitations    How long can you walk comfortably?  no limitations    Diagnostic tests  no imaging at this time.    Currently in Pain?  Yes    Pain Score  8     Pain Location  Neck    Pain Orientation  Right    Pain Descriptors / Indicators  Tender;Aching    Pain Type  Chronic pain    Pain Radiating Towards  to the R shoulder and down the R arm    Pain Onset  More than a month ago    Pain Frequency  Constant    Aggravating Factors   lifting any amount of weight and carrying something in the RUE  Pain Relieving Factors  rest, Aleve    Effect of Pain on Daily Activities  I do less becuase there are some things that I have trouble doing becuase of the pain in my arm and neck. I have trouble with folding laundry becuase it aggravates her arm/neck.    Multiple Pain Sites  No         [facility] PT Assessment - 11/08/18 0001      Assessment   Medical Diagnosis  Cervical pain    Referring Provider (PT)  Ferne Reus PA    Hand Dominance  Left    Prior Therapy  Pt reports that she had physical therapy a couple of years ago that seemed to help      Precautions   Precautions  Other (comment)    Precaution Comments  COPD, PSVT      Balance Screen   Has the patient fallen in the past 6 months  No    Has the patient had a decrease in activity level because of a fear of falling?   No    Is the patient reluctant to leave their  home because of a fear of falling?   No      Home Environment   Living Environment  Private residence    Living Arrangements  Spouse/significant other    Type of [place] Access  Level entry    Home Layout  One level      Prior Function   Level of [place]  On disability      Cognition   Overall Cognitive Status  Within Functional Limits for tasks assessed      Sensation   Light Touch  Appears Intact      Posture/Postural Control   Posture/Postural Control  Postural limitations    Postural Limitations  Rounded Shoulders;Forward head      ROM / Strength   AROM / PROM / Strength  Strength;AROM      AROM   AROM Assessment Site  Cervical    Cervical Flexion  39    Cervical Extension  20    Cervical - Right Side Bend  28    Cervical - Left Side Bend  30    Cervical - Right Rotation  65    Cervical - Left Rotation  83      Strength   Strength Assessment Site  Shoulder    Right/Left Shoulder  Right;Left    Right Shoulder Flexion  4-/5    Right Shoulder ABduction  4-/5    Right Shoulder Internal Rotation  4-/5    Right Shoulder External Rotation  3/5    Left Shoulder Flexion  4-/5    Left Shoulder ABduction  4-/5    Left Shoulder Internal Rotation  4-/5    Left Shoulder External Rotation  4-/5                Objective measurements completed on examination: See above findings.      [facility] Adult PT Treatment/Exercise - 11/08/18 0001      Exercises   Exercises  Neck      Neck Exercises: Seated   Neck Retraction  10 reps    Neck Retraction Limitations  VC, demonstration and tactile cueing for the correct movement. Pt feels stretching in her anterior chest from her sternum, education on how this muscle is tight and she will feel stretching but this is okay to  just keep breathing and only feel a mild stretch, VC throughout for not holding breathe.     Other Seated Exercise  scapular retraction heavy VC, demonstration  with pt tactile cueing putting hands on therapist shoulder blades and then therapist hands on pt for tactile cueing to get the correct movement. 10x paced with VC for breathing and not holding breathe.     Other Seated Exercise  Levator scap stretch with VC and demonstration for the correct movement. Pt was educated to only feel a light stretch 2x 20 seconds             PT Education - 11/08/18 1109    Education Details  Access Code: [email or web address] discussed pacing and pursed lip breathing with pt in order to make exercise tolerable. Pt was initially getting short of breathe with cervical ROM which improved after ROM, Discussed avoiding sleeping propped up in the R side-lying position at this time due to possible aggravation of the Lumbar nerves due to pt reporting numbness in this morning and how sleeping on her back in this position may be easier on her back then sleeping on either side. Also discussed providing adequate support at the lumbar spine.    Person(s) Educated  Patient    Methods  Explanation;Demonstration;Tactile cues;Verbal cues;Handout    Comprehension  Verbalized understanding;Returned demonstration       PT Short Term Goals - 11/08/18 1128      PT SHORT TERM GOAL #1   Title  Pt will be independent with her initial HEP within 1 week.    Baseline  Pt does not have an HEP    Time  1    Period  Weeks    Status  New    Target Date  11/22/18      PT SHORT TERM GOAL #2   Title  Pt will be able to pace her breathe during activities to decrease accessory muscle use and decrease need for rest breaks to catch her breathe during exercise as demonstrated by no episodes of holding the breathe or requested rest breaks within 2 weeks.    Baseline  Pt has to take time to catch her breathe during ROM    Time  2    Period  Weeks    Status  New    Target Date  11/29/18        PT Long Term Goals - 11/08/18 1130      PT LONG TERM GOAL #1   Title  Pt will report pain 4/10 within 4  weeks in her R cervical spine and shoulder in order to improve quality of life.    Baseline  8/10    Time  4    Period  Weeks    Status  New    Target Date  12/13/18      PT LONG TERM GOAL #2   Title  Pt will improve cervical ROM to 45 degrees R/L side bending within 4 weeks in order to improve functional ROM and demonstrate improve muscle extensibility    Baseline  28 degrees to the R, 30 degrees to the L    Time  4    Period  Weeks    Status  New    Target Date  12/13/18      PT LONG TERM GOAL #3   Title  Pt will improve Bil shoulder strength to 4+/5 globally within 4 weeks to demonstrate improved functional strength.    Baseline  4-/5 globally  except 3/5 strenght R shoulder ER    Time  4    Period  Weeks    Status  New    Target Date  12/13/18             Plan - 11/08/18 1121    Clinical Impression Statement  Pt presents to physical therapy with significant tightness/tenderness in the R cervical muculature and weakness in Bil shoulders with R ER with the greatest deficits. Pt has a co-mobidity of COPD and has difficulty with pacing her breathing resulting in difficulty with continuous mobility. Pt was very tearful during her therapy session and was frustrated with her diagnosis; pt responded well to education on COPD, pursed lip breathing and pacing activities. She was able to significantly improve continuous activity during evaluation following education w/o shortness of breathe. Pt is also reporting occasional numbness in her RLE that only occurs when sleeping in R side lying in a propped up position; discussed that we will do some core strengthening and avoid this position at this time. Pt will benefit from skilled physical therapy services in order to address the above limitations.    Personal Factors and Comorbidities  Comorbidity 2;Behavior Pattern;Time since onset of injury/illness/exacerbation    Comorbidities  COPD, Proxismal SVT    Examination-Activity Limitations   Carry    Examination-Participation Restrictions  Laundry    Stability/Clinical Decision Making  Stable/Uncomplicated    Clinical Decision Making  Low    Rehab Potential  Good    PT Frequency  2x / week    PT Duration  4 weeks    PT Treatment/Interventions  Electrical Stimulation;Moist Heat;Functional mobility training;Therapeutic activities;Therapeutic exercise;Balance training;Neuromuscular re-education;Patient/family education;Manual techniques;Passive range of motion;Taping    PT Next Visit Plan  Assess HEP, continue to work on pacing activities and breathing, progress postural strengthening, STM to the cervical musculature on the R, re-iterate education on lumbar support in order to decrease symptoms on the RLE    PT Home Exercise Plan  Access Code: [email or web address]    Consulted and Agree with Plan of Care  Patient       Patient will benefit from skilled therapeutic intervention in order to improve the following deficits and impairments:  Decreased range of motion, Decreased strength, Pain  Visit Diagnosis: Abnormal posture  Right cervical radiculopathy     Problem List Patient Active Problem List   Diagnosis Date Noted  . Vitamin D deficiency 10/01/2018  . HLD (hyperlipidemia) 10/01/2018  . Hyperglycemia 10/01/2018  . Right cervical radiculopathy 10/01/2018  . Trapezius muscle strain, right, initial encounter 10/01/2018  . Right carpal tunnel syndrome 10/01/2018  . Cerumen impaction 09/07/2017  . Acne vulgaris 12/28/2016  . Bronchiectasis with acute exacerbation ([place]) 12/22/2016  . Medicare annual wellness visit, subsequent 04/28/2016  . Encounter for general adult medical examination with abnormal findings 04/28/2016  . Lung nodules 03/16/2016  . Palpitations   . GERD (gastroesophageal reflux disease) 07/30/2015  . Bronchiectasis without acute exacerbation ([place]) 07/08/2015  . COPD (chronic obstructive pulmonary disease) ([place]) 06/04/2015  . Fatigue 06/04/2015    Ander Purpura, PT 11/08/2018, 11:37 AM  [place] [address] [place], [place], [address] Phone: [phone]   Fax:  [phone]  Name: Guadelupe Brevig MRN: [id number] Date of Birth: 02/05/1970

## 2018-11-08 NOTE — Patient Instructions (Signed)
Access Code: [email or web address]  URL: [email or web address]  Date: 11/08/2018  Prepared by: Tomma Rakers   Exercises Seated Scapular Retraction - 10 reps - 1 sets - 2-3x daily - 7x weekly Seated Cervical Retraction - 10 reps - 1 sets - 2-3x daily - 7x weekly Gentle Levator Scapulae Stretch - 2 reps - 1 sets - 20 seconds turn your head to the L looking down at your L armpit. hold - 1x daily - 7x weekly

## 2018-11-11 ENCOUNTER — Other Ambulatory Visit: Payer: Self-pay | Admitting: Physician Assistant

## 2018-11-13 ENCOUNTER — Ambulatory Visit: Payer: Medicare HMO | Attending: Physician Assistant | Admitting: Physical Therapy

## 2018-11-13 ENCOUNTER — Other Ambulatory Visit: Payer: Self-pay

## 2018-11-13 DIAGNOSIS — R293 Abnormal posture: Secondary | ICD-10-CM | POA: Diagnosis not present

## 2018-11-13 DIAGNOSIS — M5412 Radiculopathy, cervical region: Secondary | ICD-10-CM | POA: Diagnosis not present

## 2018-11-13 NOTE — Therapy (Signed)
[place], [place], [address] Phone: [phone]   Fax:  [phone]  Physical Therapy Treatment  Patient Details  Name: Hannah Barron MRN: [id number] Date of Birth: [birth year] Referring Provider (PT): Ferne Reus PA   Encounter Date: 11/13/2018  PT End of Session - 11/13/18 1012    Visit Number  2    PT Start Time  0930    PT Stop Time  1022    PT Time Calculation (min)  52 min       Past Medical History:  Diagnosis Date  . Anemia   . Bronchiectasis ([place])   . COPD (chronic obstructive pulmonary disease) ([place])   . GERD (gastroesophageal reflux disease)   . Migraines   . Paroxysmal SVT (supraventricular tachycardia) ([place])    a. diagnosed in 11/2015.  . Right leg numbness     Past Surgical History:  Procedure Laterality Date  . CESAREAN SECTION    . CYSTOSCOPY N/A 04/05/2016   Procedure: CYSTOSCOPY;  Surgeon: Lavonia Drafts, MD;  Location: [facility];  Service: Gynecology;  Laterality: N/A;  . LAPAROSCOPIC VAGINAL HYSTERECTOMY WITH SALPINGECTOMY Bilateral 04/05/2016   Procedure: LAPAROSCOPIC ASSISTED VAGINAL HYSTERECTOMY WITH SALPINGECTOMY;  Surgeon: Lavonia Drafts, MD;  Location: [facility];  Service: Gynecology;  Laterality: Bilateral;  . LUNG BIOPSY    . VIDEO BRONCHOSCOPY Bilateral 03/29/2016   Procedure: VIDEO BRONCHOSCOPY WITHOUT FLUORO;  Surgeon: Marshell Garfinkel, MD;  [facility];  Service: Cardiopulmonary;  Laterality: Bilateral;    There were no vitals filed for this visit.  Subjective Assessment - 11/13/18 0930    Subjective  "pain comes and goes". pt states that her R UE goes numb from time to time.    Currently in Pain?  Yes    Pain Score  7                        [facility] Adult PT Treatment/Exercise - 11/13/18 0001      Exercises   Exercises  Shoulder      Neck Exercises: Seated   Neck Retraction  20 reps    Neck Retraction  Limitations  VC, demonstration and tactile cueiing for correct movement    Other Seated Exercise  scapular retraction VC, demonstration and SPTA hands on pt for tactile cueing to get the correct movement. 10x paced with VC for breathing and not holding breathe.       Shoulder Exercises: Standing   External Rotation  Strengthening;20 reps;Theraband    Theraband Level (Shoulder External Rotation)  Level 1 (Yellow)    Flexion  Strengthening;20 reps;Weights    Shoulder Flexion Weight (lbs)  2    ABduction  Strengthening;20 reps;Weights    Shoulder ABduction Weight (lbs)  2    Other Standing Exercises  IR 2 x 10 w/ yellow band      Modalities   Modalities  Electrical Stimulation;Moist Heat      Moist Heat Therapy   Number Minutes Moist Heat  15 Minutes    Moist Heat Location  Shoulder;Cervical      Electrical Stimulation   Electrical Stimulation Location  R upper trap    Electrical Stimulation Action  premod    Electrical Stimulation Parameters  sitting    Electrical Stimulation Goals  Pain      Manual Therapy   Manual Therapy  Soft tissue mobilization    Manual therapy comments  R upper traps, mutiple TP and  very tight/tender               PT Short Term Goals - 11/08/18 1128      PT SHORT TERM GOAL #1   Title  Pt will be independent with her initial HEP within 1 week.    Baseline  Pt does not have an HEP    Time  1    Period  Weeks    Status  New    Target Date  11/22/18      PT SHORT TERM GOAL #2   Title  Pt will be able to pace her breathe during activities to decrease accessory muscle use and decrease need for rest breaks to catch her breathe during exercise as demonstrated by no episodes of holding the breathe or requested rest breaks within 2 weeks.    Baseline  Pt has to take time to catch her breathe during ROM    Time  2    Period  Weeks    Status  New    Target Date  11/29/18        PT Long Term Goals - 11/08/18 1130      PT LONG TERM GOAL #1    Title  Pt will report pain 4/10 within 4 weeks in her R cervical spine and shoulder in order to improve quality of life.    Baseline  8/10    Time  4    Period  Weeks    Status  New    Target Date  12/13/18      PT LONG TERM GOAL #2   Title  Pt will improve cervical ROM to 45 degrees R/L side bending within 4 weeks in order to improve functional ROM and demonstrate improve muscle extensibility    Baseline  28 degrees to the R, 30 degrees to the L    Time  4    Period  Weeks    Status  New    Target Date  12/13/18      PT LONG TERM GOAL #3   Title  Pt will improve Bil shoulder strength to 4+/5 globally within 4 weeks to demonstrate improved functional strength.    Baseline  4-/5 globally except 3/5 strenght R shoulder ER    Time  4    Period  Weeks    Status  New    Target Date  12/13/18            Plan - 11/13/18 1014    Clinical Impression Statement  Pt tolerated STM well. she is very tender in the R upper traps and has many TPs. Pt needs cues for STM to relax. She needs verbal and tactile cues for neck/scapular retraction to improve technique and form. pt agreed to try estim and tolerated it well. pt needs rest breaks between exercises secondary to SOB.    Personal Factors and Comorbidities  Comorbidity 2;Behavior Pattern;Time since onset of injury/illness/exacerbation    Examination-Activity Limitations  Carry    Examination-Participation Restrictions  Laundry    Stability/Clinical Decision Making  Stable/Uncomplicated    Rehab Potential  Good    PT Frequency  2x / week    PT Duration  4 weeks    PT Treatment/Interventions  Electrical Stimulation;Moist Heat;Functional mobility training;Therapeutic activities;Therapeutic exercise;Balance training;Neuromuscular re-education;Patient/family education;Manual techniques;Passive range of motion;Taping    PT Next Visit Plan  postural strengthening and STM       Patient will benefit from skilled therapeutic intervention in order  to  improve the following deficits and impairments:  Decreased range of motion, Decreased strength, Pain  Visit Diagnosis: Abnormal posture  Right cervical radiculopathy     Problem List Patient Active Problem List   Diagnosis Date Noted  . Vitamin D deficiency 10/01/2018  . HLD (hyperlipidemia) 10/01/2018  . Hyperglycemia 10/01/2018  . Right cervical radiculopathy 10/01/2018  . Trapezius muscle strain, right, initial encounter 10/01/2018  . Right carpal tunnel syndrome 10/01/2018  . Cerumen impaction 09/07/2017  . Acne vulgaris 12/28/2016  . Bronchiectasis with acute exacerbation ([place]) 12/22/2016  . Medicare annual wellness visit, subsequent 04/28/2016  . Encounter for general adult medical examination with abnormal findings 04/28/2016  . Lung nodules 03/16/2016  . Palpitations   . GERD (gastroesophageal reflux disease) 07/30/2015  . Bronchiectasis without acute exacerbation ([place]) 07/08/2015  . COPD (chronic obstructive pulmonary disease) ([place]) 06/04/2015  . Fatigue 06/04/2015    Barrett Henle, [place] 11/13/2018, 10:24 AM  [place], [place], [address] Phone: [phone]   Fax:  [phone]  Name: Ceirra Leblond MRN: [id number] Date of Birth: 1970/12/15

## 2018-11-15 ENCOUNTER — Ambulatory Visit: Payer: Medicare HMO | Admitting: Physical Therapy

## 2018-11-15 ENCOUNTER — Other Ambulatory Visit: Payer: Self-pay

## 2018-11-15 DIAGNOSIS — M5412 Radiculopathy, cervical region: Secondary | ICD-10-CM

## 2018-11-15 DIAGNOSIS — R293 Abnormal posture: Secondary | ICD-10-CM | POA: Diagnosis not present

## 2018-11-15 NOTE — Therapy (Signed)
[facility] [place], [place], [address] Phone: [phone]   Fax:  [phone]  Physical Therapy Treatment  Patient Details  Name: Hannah Barron MRN: [id number] Date of Birth: [birth year] Referring Provider (PT): Ferne Reus [place]   Encounter Date: 11/15/2018  PT End of Session - 11/15/18 1030    Visit Number  3    PT Start Time  6004    PT Stop Time  5997    PT Time Calculation (min)  55 min       Past Medical History:  Diagnosis Date  . Anemia   . Bronchiectasis ([place])   . COPD (chronic obstructive pulmonary disease) ([place])   . GERD (gastroesophageal reflux disease)   . Migraines   . Paroxysmal SVT (supraventricular tachycardia) ([place])    a. diagnosed in 11/2015.  . Right leg numbness     Past Surgical History:  Procedure Laterality Date  . CESAREAN SECTION    . CYSTOSCOPY N/A 04/05/2016   Procedure: CYSTOSCOPY;  Surgeon: Lavonia Drafts, MD;  Location: [facility];  Service: Gynecology;  Laterality: N/A;  . LAPAROSCOPIC VAGINAL HYSTERECTOMY WITH SALPINGECTOMY Bilateral 04/05/2016   Procedure: LAPAROSCOPIC ASSISTED VAGINAL HYSTERECTOMY WITH SALPINGECTOMY;  Surgeon: Lavonia Drafts, MD;  Location: [facility];  Service: Gynecology;  Laterality: Bilateral;  . LUNG BIOPSY    . VIDEO BRONCHOSCOPY Bilateral 03/29/2016   Procedure: VIDEO BRONCHOSCOPY WITHOUT FLUORO;  Surgeon: Marshell Garfinkel, MD;  [facility];  Service: Cardiopulmonary;  Laterality: Bilateral;    There were no vitals filed for this visit.  Subjective Assessment - 11/15/18 0954    Subjective  "feeling good after last session, just tired". pt states that she has discomfort in her chest when she breathes.    Currently in Pain?  No/denies                       [facility] Adult PT Treatment/Exercise - 11/15/18 0001      Neck Exercises: Machines for Strengthening   UBE (Upper Arm Bike)  L1 2 min each direction     Cybex Row  15# 2 x 10      Neck Exercises: Seated   Neck Retraction  10 reps    Other Seated Exercise  scapular retraction and shoulder extension    red tband     Shoulder Exercises: Standing   External Rotation  Strengthening;20 reps;Theraband    Theraband Level (Shoulder External Rotation)  Level 2 (Red)    Flexion  Strengthening;Weights;10 reps    Shoulder Flexion Weight (lbs)  2    ABduction  Strengthening;Weights;10 reps    Shoulder ABduction Weight (lbs)  2    Other Standing Exercises  W and T stretches      Moist Heat Therapy   Number Minutes Moist Heat  15 Minutes    Moist Heat Location  Shoulder;Cervical      Electrical Stimulation   Electrical Stimulation Location  R upper trap    Electrical Stimulation Action  IFC    Electrical Stimulation Parameters  sitting    Electrical Stimulation Goals  Pain      Manual Therapy   Manual Therapy  Soft tissue mobilization    Manual therapy comments  R upper traps, mutiple TP and very tight/tender             PT Education - 11/15/18 1037    Education Details  pt educated on the importance of proper posture and  stretching of anterior muscles.    Person(s) Educated  Patient    Methods  Explanation    Comprehension  Verbalized understanding       PT Short Term Goals - 11/15/18 1015      PT SHORT TERM GOAL #1   Title  Pt will be independent with her initial HEP within 1 week.    Status  Achieved      PT SHORT TERM GOAL #2   Title  Pt will be able to pace her breathe during activities to decrease accessory muscle use and decrease need for rest breaks to catch her breathe during exercise as demonstrated by no episodes of holding the breathe or requested rest breaks within 2 weeks.    Status  Achieved        PT Long Term Goals - 11/15/18 1016      PT LONG TERM GOAL #1   Title  Pt will report pain 4/10 within 4 weeks in her R cervical spine and shoulder in order to improve quality of life.    Status  Partially Met       PT LONG TERM GOAL #2   Title  Pt will improve cervical ROM to 45 degrees R/L side bending within 4 weeks in order to improve functional ROM and demonstrate improve muscle extensibility    Status  On-going      PT LONG TERM GOAL #3   Title  Pt will improve Bil shoulder strength to 4+/5 globally within 4 weeks to demonstrate improved functional strength.    Status  On-going            Plan - 11/15/18 1032    Clinical Impression Statement  pt is very tender with STM and has many TP on the R upper trap. pt needs continually verbal cues for posture and to look up. Pt experiences pain w/ shoulder flexion/abduction. pt needs verbal and tactile cues with rows to improve form and technique. pt needs breaks and pacing between exercises due to SOB.    Personal Factors and Comorbidities  Comorbidity 2;Behavior Pattern;Time since onset of injury/illness/exacerbation    Comorbidities  COPD, Proxismal SVT    Examination-Activity Limitations  Carry    Examination-Participation Restrictions  Laundry    Stability/Clinical Decision Making  Stable/Uncomplicated    Rehab Potential  Good    PT Frequency  2x / week    PT Treatment/Interventions  Electrical Stimulation;Moist Heat;Functional mobility training;Therapeutic activities;Therapeutic exercise;Balance training;Neuromuscular re-education;Patient/family education;Manual techniques;Passive range of motion;Taping    PT Next Visit Plan  postural strengthening and STM       Patient will benefit from skilled therapeutic intervention in order to improve the following deficits and impairments:  Decreased range of motion, Decreased strength, Pain  Visit Diagnosis: Abnormal posture  Right cervical radiculopathy     Problem List Patient Active Problem List   Diagnosis Date Noted  . Vitamin D deficiency 10/01/2018  . HLD (hyperlipidemia) 10/01/2018  . Hyperglycemia 10/01/2018  . Right cervical radiculopathy 10/01/2018  . Trapezius muscle  strain, right, initial encounter 10/01/2018  . Right carpal tunnel syndrome 10/01/2018  . Cerumen impaction 09/07/2017  . Acne vulgaris 12/28/2016  . Bronchiectasis with acute exacerbation ([place]) 12/22/2016  . Medicare annual wellness visit, subsequent 04/28/2016  . Encounter for general adult medical examination with abnormal findings 04/28/2016  . Lung nodules 03/16/2016  . Palpitations   . GERD (gastroesophageal reflux disease) 07/30/2015  . Bronchiectasis without acute exacerbation ([place]) 07/08/2015  . COPD (chronic obstructive  pulmonary disease) ([place]) 06/04/2015  . Fatigue 06/04/2015    Mallie Darting 11/15/2018, 10:43 AM  [facility] [place] [employer] [address] [place], [place], [address] Phone: [phone]   Fax:  [phone]  Name: Hannah Barron MRN: [id number] Date of Birth: 1970/03/02

## 2018-11-16 ENCOUNTER — Encounter: Payer: Self-pay | Admitting: Physician Assistant

## 2018-11-16 ENCOUNTER — Ambulatory Visit: Payer: Medicare HMO | Admitting: Physician Assistant

## 2018-11-16 VITALS — BP 98/42 | HR 82 | Temp 97.3°F | Ht 67.0 in | Wt 169.0 lb

## 2018-11-16 DIAGNOSIS — R0600 Dyspnea, unspecified: Secondary | ICD-10-CM | POA: Diagnosis not present

## 2018-11-16 DIAGNOSIS — J449 Chronic obstructive pulmonary disease, unspecified: Secondary | ICD-10-CM

## 2018-11-16 DIAGNOSIS — R200 Anesthesia of skin: Secondary | ICD-10-CM | POA: Diagnosis not present

## 2018-11-16 DIAGNOSIS — R0609 Other forms of dyspnea: Secondary | ICD-10-CM

## 2018-11-16 DIAGNOSIS — R202 Paresthesia of skin: Secondary | ICD-10-CM | POA: Diagnosis not present

## 2018-11-16 DIAGNOSIS — R002 Palpitations: Secondary | ICD-10-CM

## 2018-11-16 NOTE — Progress Notes (Signed)
Cardiology Office Note   Date:  11/16/2018   ID:  Hannah Barron, DOB 1970-11-13, MRN [id number]  PCP:  Hannah Borg, MD Cardiologist:  Peter Martinique, MD 10/26/2016 Electrphysiologist: None Hannah Ferries, PA-C 09/21/2017  No chief complaint on file.   History of Present Illness: Hannah Barron is a 48 y.o. female immigrant from [place] with a history of COPD, bronchiectasis (never smoker), migraines, palpitations with sinus tach and SVT documented, EF 65-70% by echo 2017, possible cystic fibrosis, normal Myoview 2017, hyperlipidemia (Lipitor started 10/01/2018), GERD, vitamin D deficiency  10/01/2018 PCP office visit, patient reporting more palpitations and complaining of right neck and arm pain, cardiology appointment made 10-11/2018 patient receiving physical therapy for neck and arm pain right cervical radiculopathy  Hannah Barron presents for cardiology follow up.  She has problems w/ numbness in her R leg and arm. The leg numbness does not happen unless she lays down. She will get more R arm and leg pain if she lies on her R side, normally lays on her L side. The PT is helping her R arm and neck, but has not been doing it very long.   If she sits on the side of the bed, and moves her arms and legs, the numbness will get better. However, she initially has to help move her R leg, it will not move on its own. She also gets tingling in her R leg.  She gets palpitations, can start at rest, or rarely with exertion. She will feel her heart go fast when she tries to get up fast out of bed at times. She will get dizzy occasionally from the palpitations, about once a month. She takes the extra 1/2 metoprolol 3-4 x month. She will get diaphoretic at times with the palps.   Because of her lungs, she does not do much. She will get tired and SOB very easily. She can walk a block or 2 without stopping, but has to go slow.   She will get chest pressure in the middle of the night, or sitting still. Never  with exertion. If she holds her arm over her heart and presses in, that will help.    Past Medical History:  Diagnosis Date  . Anemia   . Bronchiectasis ([employer])   . COPD (chronic obstructive pulmonary disease) ([place])   . GERD (gastroesophageal reflux disease)   . Migraines   . Paroxysmal SVT (supraventricular tachycardia) ([place])    a. diagnosed in 11/2015.  . Right leg numbness     Past Surgical History:  Procedure Laterality Date  . CESAREAN SECTION    . CYSTOSCOPY N/A 04/05/2016   Procedure: CYSTOSCOPY;  Surgeon: Lavonia Drafts, MD;  Location: [facility];  Service: Gynecology;  Laterality: N/A;  . LAPAROSCOPIC VAGINAL HYSTERECTOMY WITH SALPINGECTOMY Bilateral 04/05/2016   Procedure: LAPAROSCOPIC ASSISTED VAGINAL HYSTERECTOMY WITH SALPINGECTOMY;  Surgeon: Lavonia Drafts, MD;  Location: [facility];  Service: Gynecology;  Laterality: Bilateral;  . LUNG BIOPSY    . VIDEO BRONCHOSCOPY Bilateral 03/29/2016   Procedure: VIDEO BRONCHOSCOPY WITHOUT FLUORO;  Surgeon: Marshell Garfinkel, MD;  [facility];  Service: Cardiopulmonary;  Laterality: Bilateral;    Current Outpatient Medications  Medication Sig Dispense Refill  . albuterol (PROVENTIL HFA;VENTOLIN HFA) 108 (90 Base) MCG/ACT inhaler INHALE 2 PUFFS INTO THE LUNGS EVERY 6 HOURS AS NEEDED FOR WHEEZING OR SHORTNESS OF BREATH 54 Inhaler 3  . atorvastatin (LIPITOR) 20 MG tablet Take 1 tablet (20 mg total) by mouth daily. 90 tablet 3  .  budesonide-formoterol (SYMBICORT) 160-4.5 MCG/ACT inhaler Inhale 2 puffs into the lungs 2 (two) times daily. 1 Inhaler 5  . CLARAVIS 40 MG capsule Take 2 capsules by mouth daily.    . metoprolol succinate (TOPROL-XL) 50 MG 24 hr tablet TAKE 1 TABLET IN MORNING & 1/2 TABLET IN EVENING *TAKE 1/2 TAB AS NEEDED FOR PALPITATIONS/RAPID HR* 60 tablet 0  . omeprazole (PRILOSEC) 40 MG capsule TAKE 1 CAPSULE BY MOUTH EVERY DAY 90 capsule 3  . Respiratory Therapy Supplies (FLUTTER) DEVI Use as directed. 1 each  0  . RESTASIS 0.05 % ophthalmic emulsion Instill 1 drop into both eyes two times a day  2   No current facility-administered medications for this visit.     Allergies:   Patient has no known allergies.    Social History:  The patient  reports that she has never smoked. She has never used smokeless tobacco. She reports that she does not drink alcohol or use drugs.   Family History:  The patient's family history includes Healthy in her father and mother.  She indicated that her mother is alive. She indicated that her father is alive. She indicated that her maternal grandmother is deceased. She indicated that her maternal grandfather is deceased. She indicated that her paternal grandmother is deceased. She indicated that her paternal grandfather is deceased.   ROS:  Please see the history of present illness. All other systems are reviewed and negative.    PHYSICAL EXAM: VS:  BP (!) 98/42   Pulse 82   Temp (!) 97.3 F (36.3 C) (Temporal)   Ht 5\' 7"  (1.702 m)   Wt 169 lb (76.7 kg)   LMP 03/31/2016   SpO2 97%   BMI 26.47 kg/m  , BMI Body mass index is 26.47 kg/m. GEN: Well nourished, well developed, female in no acute distress HEENT: normal for age  Neck: no JVD, no carotid bruit, no masses Cardiac: RRR; no murmur, no rubs, or gallops Respiratory:  clear to auscultation bilaterally, normal work of breathing GI: soft, nontender, nondistended, + BS MS: no deformity or atrophy; no edema; distal pulses are 2+ in all 4 extremities, lower extremity cap refill WNL Skin: warm and dry, no rash Neuro:  Strength and sensation are intact Psych: euthymic mood, full affect   EKG:  EKG is not ordered today.  ECHO: 11/07/2015 - Left ventricle: The cavity size was normal. There was mild focal   basal hypertrophy of the septum. Systolic function was vigorous.   The estimated ejection fraction was in the range of 65% to 70%.   Wall motion was normal; there were no regional wall motion    abnormalities. Left ventricular diastolic function parameters   were normal. - Aortic valve: Trileaflet; normal thickness, mildly calcified   leaflets.   MYOVIEW: 12/18/2015  The left ventricular ejection fraction is normal (55-65%).  Nuclear stress EF: 64%.  The myocardial perufusion images are normal.  There is borderline transient ischemic dilatation with a TID of 1.19. Consider further evaluation with coronary CTA if clinically indicated.  This is an intermediate risk study due to borderline TID.  Blood pressure demonstrated a normal response to exercise.  There was 49mm of J point depression with upsloping ST segment depression in the inferolateral leads    Recent Labs: 10/01/2018: ALT 28; BUN 7; Creatinine, Ser 0.69; Hemoglobin 13.0; Platelets 284.0; Potassium 4.8; Sodium 138; TSH 0.83  CBC    Component Value Date/Time   WBC 5.5 10/01/2018 1053   RBC 4.06  10/01/2018 1053   HGB 13.0 10/01/2018 1053   HGB 12.8 08/24/2016 1616   HCT 38.6 10/01/2018 1053   HCT 38.2 08/24/2016 1616   PLT 284.0 10/01/2018 1053   PLT 325 08/24/2016 1616   MCV 95.0 10/01/2018 1053   MCV 91 08/24/2016 1616   MCH 30.3 09/12/2016 1446   MCHC 33.7 10/01/2018 1053   RDW 13.2 10/01/2018 1053   RDW 14.2 08/24/2016 1616   LYMPHSABS 1.9 10/01/2018 1053   MONOABS 0.5 10/01/2018 1053   EOSABS 0.1 10/01/2018 1053   BASOSABS 0.0 10/01/2018 1053   CMP Latest Ref Rng & Units 10/01/2018 03/16/2018 08/16/2017  Glucose 70 - 99 mg/dL 87 103(H) 116(H)  BUN 6 - 23 mg/dL 7 13 7   Creatinine 0.40 - 1.20 mg/dL 0.69 0.86 0.74  Sodium 135 - 145 mEq/L 138 136 138  Potassium 3.5 - 5.1 mEq/L 4.8 4.0 3.7  Chloride 96 - 112 mEq/L 103 103 104  CO2 19 - 32 mEq/L 27 23 29   Calcium 8.4 - 10.5 mg/dL 10.0 9.8 9.4  Total Protein 6.0 - 8.3 g/dL 7.8 7.9 7.4  Total Bilirubin 0.2 - 1.2 mg/dL 0.3 0.3 0.2  Alkaline Phos 39 - 117 U/L 69 71 69  AST 0 - 37 U/L 26 24 23   ALT 0 - 35 U/L 28 37(H) 29     Lipid Panel Lab Results   Component Value Date   CHOL 186 10/01/2018   HDL 58.90 10/01/2018   LDLCALC 114 (H) 10/01/2018   TRIG 69.0 10/01/2018   CHOLHDL 3 10/01/2018      Wt Readings from Last 3 Encounters:  11/16/18 169 lb (76.7 kg)  10/01/18 172 lb (78 kg)  08/09/18 176 lb 12.8 oz (80.2 kg)     Other studies Reviewed: Additional studies/ records that were reviewed today include: Office notes, hospital records and testing.  ASSESSMENT AND PLAN:  1.  RLE numbness:  - distal pulses are good, cap refill WNL - Sx are positional, not exertional - do not think PAD - pt to f/u with Dr Jenny Reichmann for non-circulatory causes, may have a back issue  2. Palpitations:  - she is having them on a regular basis - takes extra metoprolol 3-4 x month - BP not high enough to increase standing dose - occasional dizziness w/ them - need to know how often she is having episodes and how long they last - if happening often enough and long enough, may need EP eval - ck 30-day monitor - ck echo to make sure no structural abnormalities  3. DOE, COPD - chronic and 2nd COPD/Bronchiectasis - no volume overload on exam - no recent change in symptoms - ck echo to make sure R heart pressures are ok   Current medicines are reviewed at length with the patient today.  The patient does not have concerns regarding medicines.  The following changes have been made:  no change  Labs/ tests ordered today include:  No orders of the defined types were placed in this encounter.    Disposition:   FU with Peter Martinique, MD  Signed, Hannah Ferries, PA-C  11/16/2018 4:24 PM    [place] Phone: [phone]; Fax: [phone]

## 2018-11-16 NOTE — Patient Instructions (Signed)
Medication Instructions:  Your physician recommends that you continue on your current medications as directed. Please refer to the Current Medication list given to you today.  *If you need a refill on your cardiac medications before your next appointment, please call your pharmacy*   Testing/Procedures: Your physician has requested that you have an echocardiogram. Echocardiography is a painless test that uses sound waves to create images of your heart. It provides your doctor with information about the size and shape of your heart and how well your heart's chambers and valves are working. This procedure takes approximately one hour. There are no restrictions for this procedure.  Your physician has recommended that you wear a 30 day event monitor. Event monitors are medical devices that record the heart's electrical activity. Doctors most often [place] these monitors to diagnose arrhythmias. Arrhythmias are problems with the speed or rhythm of the heartbeat. The monitor is a small, portable device. You can wear one while you do your normal daily activities. This is usually used to diagnose what is causing palpitations/syncope (passing out).   Follow-Up: At [facility], you and your health needs are our priority.  As part of our continuing mission to provide you with exceptional heart care, we have created designated Provider Care Teams.  These Care Teams include your primary Cardiologist (physician) and Advanced Practice Providers (APPs -  Physician Assistants and Nurse Practitioners) who all work together to provide you with the care you need, when you need it.  Your next appointment:   We will call you with your monitor results and let you know when to follow-up. Please call our office if your symptoms worsen. Call 911 if you have severe chest pain or SOB.

## 2018-11-19 ENCOUNTER — Encounter: Payer: Medicare HMO | Admitting: Physical Therapy

## 2018-11-20 ENCOUNTER — Ambulatory Visit: Payer: Medicare HMO | Admitting: Physical Therapy

## 2018-11-20 ENCOUNTER — Other Ambulatory Visit: Payer: Self-pay

## 2018-11-20 DIAGNOSIS — R293 Abnormal posture: Secondary | ICD-10-CM

## 2018-11-20 DIAGNOSIS — M5412 Radiculopathy, cervical region: Secondary | ICD-10-CM | POA: Diagnosis not present

## 2018-11-20 NOTE — Therapy (Signed)
[facility] [place], [place], [address] Phone: [phone]   Fax:  [phone]  Physical Therapy Treatment  Patient Details  Name: Hannah Barron MRN: [id number] Date of Birth: 1970/03/04 Referring Provider (PT): Ferne Reus PA   Encounter Date: 11/20/2018  PT End of Session - 11/20/18 1257    Visit Number  4    PT Start Time  4967    PT Stop Time  1025    PT Time Calculation (min)  57 min       Past Medical History:  Diagnosis Date  . Anemia   . Bronchiectasis ([place])   . COPD (chronic obstructive pulmonary disease) ([place])   . GERD (gastroesophageal reflux disease)   . Migraines   . Paroxysmal SVT (supraventricular tachycardia) ([place])    a. diagnosed in 11/2015.  . Right leg numbness     Past Surgical History:  Procedure Laterality Date  . CESAREAN SECTION    . CYSTOSCOPY N/A 04/05/2016   Procedure: CYSTOSCOPY;  Surgeon: Lavonia Drafts, MD;  Location: [facility];  Service: Gynecology;  Laterality: N/A;  . LAPAROSCOPIC VAGINAL HYSTERECTOMY WITH SALPINGECTOMY Bilateral 04/05/2016   Procedure: LAPAROSCOPIC ASSISTED VAGINAL HYSTERECTOMY WITH SALPINGECTOMY;  Surgeon: Lavonia Drafts, MD;  Location: [facility];  Service: Gynecology;  Laterality: Bilateral;  . LUNG BIOPSY    . VIDEO BRONCHOSCOPY Bilateral 03/29/2016   Procedure: VIDEO BRONCHOSCOPY WITHOUT FLUORO;  Surgeon: Marshell Garfinkel, MD;  [facility];  Service: Cardiopulmonary;  Laterality: Bilateral;    There were no vitals filed for this visit.  Subjective Assessment - 11/20/18 0926    Subjective  "feeling better, I can tell that therapy is helping". pt states she has good and bad days. "pain is 5/10 on bad days".    Currently in Pain?  No/denies    Pain Score  0-No pain    Pain Location  --    Pain Orientation  --                       [facility] Adult PT Treatment/Exercise - 11/20/18 0001      Neck Exercises:  Machines for Strengthening   UBE (Upper Arm Bike)  L1 3 min each direction    Cybex Row  15# 2 x 10    Lat Pull  15#  2 x 10      Neck Exercises: Seated   Neck Retraction  20 reps      Shoulder Exercises: Standing   External Rotation  Strengthening;20 reps;Theraband    Theraband Level (Shoulder External Rotation)  Level 2 (Red)    Flexion  Strengthening;Weights;20 reps    Shoulder Flexion Weight (lbs)  3    ABduction  Strengthening;Weights;20 reps    Shoulder ABduction Weight (lbs)  2    Extension  Strengthening;Theraband;20 reps;Both    Theraband Level (Shoulder Extension)  Level 2 (Red)    Row  Strengthening;Both;20 reps;Theraband    Theraband Level (Shoulder Row)  Level 2 (Red)      Moist Heat Therapy   Number Minutes Moist Heat  15 Minutes    Moist Heat Location  Shoulder;Cervical      Electrical Stimulation   Electrical Stimulation Location  R upper trap    Electrical Stimulation Action  IFC    Electrical Stimulation Parameters  sitting    Electrical Stimulation Goals  Pain      Manual Therapy   Manual Therapy  Soft tissue mobilization  Manual therapy comments  R upper traps, mutiple TP and very tight/tender               PT Short Term Goals - 11/15/18 1015      PT SHORT TERM GOAL #1   Title  Pt will be independent with her initial HEP within 1 week.    Status  Achieved      PT SHORT TERM GOAL #2   Title  Pt will be able to pace her breathe during activities to decrease accessory muscle use and decrease need for rest breaks to catch her breathe during exercise as demonstrated by no episodes of holding the breathe or requested rest breaks within 2 weeks.    Status  Achieved        PT Long Term Goals - 11/15/18 1016      PT LONG TERM GOAL #1   Title  Pt will report pain 4/10 within 4 weeks in her R cervical spine and shoulder in order to improve quality of life.    Status  Partially Met      PT LONG TERM GOAL #2   Title  Pt will improve cervical  ROM to 45 degrees R/L side bending within 4 weeks in order to improve functional ROM and demonstrate improve muscle extensibility    Status  On-going      PT LONG TERM GOAL #3   Title  Pt will improve Bil shoulder strength to 4+/5 globally within 4 weeks to demonstrate improved functional strength.    Status  On-going            Plan - 11/20/18 1253    Clinical Impression Statement  pt arrived looking less anxious today. she was progressed to the lat pull downs. pt needs cues to keep her shoulders relaxed with all exercises. pt complains of pain with shoulder abduction. she was able to increase weight with shoulder flex. TP in R upper trap is starting to go away.       Patient will benefit from skilled therapeutic intervention in order to improve the following deficits and impairments:     Visit Diagnosis: Right cervical radiculopathy  Abnormal posture     Problem List Patient Active Problem List   Diagnosis Date Noted  . Vitamin D deficiency 10/01/2018  . HLD (hyperlipidemia) 10/01/2018  . Hyperglycemia 10/01/2018  . Right cervical radiculopathy 10/01/2018  . Trapezius muscle strain, right, initial encounter 10/01/2018  . Right carpal tunnel syndrome 10/01/2018  . Cerumen impaction 09/07/2017  . Acne vulgaris 12/28/2016  . Bronchiectasis with acute exacerbation ([place]) 12/22/2016  . Medicare annual wellness visit, subsequent 04/28/2016  . Encounter for general adult medical examination with abnormal findings 04/28/2016  . Lung nodules 03/16/2016  . Palpitations   . GERD (gastroesophageal reflux disease) 07/30/2015  . Bronchiectasis without acute exacerbation ([place]) 07/08/2015  . COPD (chronic obstructive pulmonary disease) ([place]) 06/04/2015  . Fatigue 06/04/2015    Barrett Henle, [place] 11/20/2018, 12:59 PM  [facility], [place], [address] Phone: [phone]   Fax:   [phone]  Name: Hannah Barron MRN: [id number] Date of Birth: [birth year]

## 2018-11-21 ENCOUNTER — Encounter: Payer: Medicare HMO | Admitting: Physical Therapy

## 2018-11-22 ENCOUNTER — Ambulatory Visit: Payer: Medicare HMO

## 2018-11-23 ENCOUNTER — Ambulatory Visit: Payer: Medicare HMO | Admitting: Physical Therapy

## 2018-11-27 ENCOUNTER — Ambulatory Visit (HOSPITAL_COMMUNITY): Payer: Medicare HMO | Attending: Cardiology

## 2018-11-27 ENCOUNTER — Other Ambulatory Visit: Payer: Self-pay

## 2018-11-27 ENCOUNTER — Telehealth: Payer: Self-pay | Admitting: *Deleted

## 2018-11-27 DIAGNOSIS — I313 Pericardial effusion (noninflammatory): Secondary | ICD-10-CM | POA: Insufficient documentation

## 2018-11-27 DIAGNOSIS — J449 Chronic obstructive pulmonary disease, unspecified: Secondary | ICD-10-CM | POA: Insufficient documentation

## 2018-11-27 DIAGNOSIS — R002 Palpitations: Secondary | ICD-10-CM | POA: Insufficient documentation

## 2018-11-27 NOTE — Telephone Encounter (Signed)
[employer] to ship a 30 day cardiac event monitor to the patients home.  Instructions reviewed briefly as they are included in the monitor kit.  Patient advised to call [phone] if she needs assistance over the phone on starting her monitor.

## 2018-11-28 ENCOUNTER — Telehealth (INDEPENDENT_AMBULATORY_CARE_PROVIDER_SITE_OTHER): Payer: Medicare HMO | Admitting: Obstetrics & Gynecology

## 2018-11-28 DIAGNOSIS — N898 Other specified noninflammatory disorders of vagina: Secondary | ICD-10-CM

## 2018-11-28 NOTE — Telephone Encounter (Signed)
Pt requested a call from clinical staff. Called pt. Pt reports spotting after intercourse. Explained to pt that the spotting may be occurring because of vaginal dryness, which is not abnormal during menopause. Also, pt reports vaginal itching. States she has a white discharge with a foul odor at times. Pt would like to come into the office for a self-swab.  [place] office notified to schedule pt for nurse visit.

## 2018-11-28 NOTE — Telephone Encounter (Signed)
Patient called to make an appointment with Dr Ihor Dow. I informed her she was not seeing patients in the office. She was willing to see anyone. She also stated she saw blood, and shouldn't be bleeding. She is requesting a call back.

## 2018-11-29 ENCOUNTER — Other Ambulatory Visit: Payer: Self-pay

## 2018-11-29 ENCOUNTER — Ambulatory Visit: Payer: Medicare HMO | Admitting: Physical Therapy

## 2018-11-29 ENCOUNTER — Encounter: Payer: Self-pay | Admitting: Physical Therapy

## 2018-11-29 ENCOUNTER — Telehealth: Payer: Self-pay | Admitting: Family Medicine

## 2018-11-29 DIAGNOSIS — M5412 Radiculopathy, cervical region: Secondary | ICD-10-CM | POA: Diagnosis not present

## 2018-11-29 DIAGNOSIS — R293 Abnormal posture: Secondary | ICD-10-CM

## 2018-11-29 NOTE — Telephone Encounter (Signed)
Spoke w/ patient to get her scheduled for her self swab. Patient was scheduled for 11/23 @ 8:30. Patient instructed to wear a face mask for the entire appointment and no visitors are allowed with her during the visit. Patient screened for covid symptoms and denied having any

## 2018-11-29 NOTE — Therapy (Signed)
[place], [place], [address] Phone: [phone]   Fax:  [phone]  Physical Therapy Treatment  Patient Details  Name: Hannah Barron MRN: [id number] Date of Birth: 1970-11-18 Referring Provider (PT): Ferne Reus PA   Encounter Date: 11/29/2018  PT End of Session - 11/29/18 [id number]    Visit Number  5    Number of Visits  9    Date for PT Re-Evaluation  12/13/18    PT Start Time  0833    PT Stop Time  0924    PT Time Calculation (min)  51 min    Activity Tolerance  Patient tolerated treatment well    Behavior During Therapy  [facility] for tasks assessed/performed       Past Medical History:  Diagnosis Date  . Anemia   . Bronchiectasis ([place])   . COPD (chronic obstructive pulmonary disease) ([place])   . GERD (gastroesophageal reflux disease)   . Migraines   . Paroxysmal SVT (supraventricular tachycardia) ([place])    a. diagnosed in 11/2015.  . Right leg numbness     Past Surgical History:  Procedure Laterality Date  . CESAREAN SECTION    . CYSTOSCOPY N/A 04/05/2016   Procedure: CYSTOSCOPY;  Surgeon: Lavonia Drafts, MD;  Location: [facility];  Service: Gynecology;  Laterality: N/A;  . LAPAROSCOPIC VAGINAL HYSTERECTOMY WITH SALPINGECTOMY Bilateral 04/05/2016   Procedure: LAPAROSCOPIC ASSISTED VAGINAL HYSTERECTOMY WITH SALPINGECTOMY;  Surgeon: Lavonia Drafts, MD;  Location: [facility];  Service: Gynecology;  Laterality: Bilateral;  . LUNG BIOPSY    . VIDEO BRONCHOSCOPY Bilateral 03/29/2016   Procedure: VIDEO BRONCHOSCOPY WITHOUT FLUORO;  Surgeon: Marshell Garfinkel, MD;  Location: [facility];  Service: Cardiopulmonary;  Laterality: Bilateral;    There were no vitals filed for this visit.  Subjective Assessment - 11/29/18 0835    Subjective  PT is helping, her pain is better and she is able to lie on her Rt arm some now, getting heart monitor next week and will wear it for a month    Currently  in Pain?  Yes    Pain Score  6     Pain Location  Neck    Pain Orientation  Right    Pain Descriptors / Indicators  Aching;Sore    Pain Type  Chronic pain    Pain Radiating Towards  into Rt shoulder    Pain Onset  More than a month ago    Pain Frequency  Intermittent    Aggravating Factors   heavy lifting and lying on her arm    Pain Relieving Factors  alieve when needed                       [facility] Adult PT Treatment/Exercise - 11/29/18 0001      Exercises   Exercises  Shoulder;Neck      Neck Exercises: Machines for Strengthening   UBE (Upper Arm Bike)  L2x4' alt FWD/BWD    Cybex Row  20# 2x10    Lat Pull  20# 2x10      Shoulder Exercises: Supine   Other Supine Exercises  2x10 on bolster, blue band, horizontal abduction, bilat ER, shoulder flexion, verbal and physical cues for correct form      Shoulder Exercises: Sidelying   Other Sidelying Exercises  2x10 open book with blue band each side      Modalities   Modalities  Electrical Stimulation;Moist Heat  Moist Heat Therapy   Number Minutes Moist Heat  15 Minutes    Moist Heat Location  Shoulder;Cervical;Lumbar Spine   sittingq     Electrical Stimulation   Electrical Stimulation Location  R upper trap    Electrical Stimulation Action  IFC    Electrical Stimulation Parameters  to tolerance    Electrical Stimulation Goals  Pain      Neck Exercises: Stretches   Other Neck Stretches  thoracic opener hooklying over rolled up yoga mat  pt had difficulty tolerating this position however it would be helpful for her                PT Short Term Goals - 11/15/18 1015      PT SHORT TERM GOAL #1   Title  Pt will be independent with her initial HEP within 1 week.    Status  Achieved      PT SHORT TERM GOAL #2   Title  Pt will be able to pace her breathe during activities to decrease accessory muscle use and decrease need for rest breaks to catch her breathe during exercise as demonstrated by no  episodes of holding the breathe or requested rest breaks within 2 weeks.    Status  Achieved        PT Long Term Goals - 11/29/18 [id number]      PT LONG TERM GOAL #1   Title  Pt will report pain 4/10 within 4 weeks in her R cervical spine and shoulder in order to improve quality of life.    Baseline  pain goes up to 6/10 occassionally    Status  On-going      PT LONG TERM GOAL #2   Title  Pt will improve cervical ROM to 45 degrees R/L side bending within 4 weeks in order to improve functional ROM and demonstrate improve muscle extensibility      PT LONG TERM GOAL #3   Title  Pt will improve Bil shoulder strength to 4+/5 globally within 4 weeks to demonstrate improved functional strength.    Status  On-going            Plan - 11/29/18 [id number]    Clinical Impression Statement  Pt reports she feels like the bolster and supine over the yoga mat helped her along with blue band work. She is making progress towards her goals.  She would benefit from continued therapy to address postural changes, pain and weakness.    Rehab Potential  Good    PT Frequency  2x / week    PT Duration  4 weeks    PT Treatment/Interventions  Electrical Stimulation;Moist Heat;Functional mobility training;Therapeutic activities;Therapeutic exercise;Balance training;Neuromuscular re-education;Patient/family education;Manual techniques;Passive range of motion;Taping    PT Next Visit Plan  postural strengthening and STM       Patient will benefit from skilled therapeutic intervention in order to improve the following deficits and impairments:  Decreased range of motion, Decreased strength, Pain  Visit Diagnosis: Right cervical radiculopathy  Abnormal posture     Problem List Patient Active Problem List   Diagnosis Date Noted  . Vitamin D deficiency 10/01/2018  . HLD (hyperlipidemia) 10/01/2018  . Hyperglycemia 10/01/2018  . Right cervical radiculopathy 10/01/2018  . Trapezius muscle strain, right, initial  encounter 10/01/2018  . Right carpal tunnel syndrome 10/01/2018  . Cerumen impaction 09/07/2017  . Acne vulgaris 12/28/2016  . Bronchiectasis with acute exacerbation ([place]) 12/22/2016  . Medicare annual wellness visit, subsequent 04/28/2016  . Encounter  for general adult medical examination with abnormal findings 04/28/2016  . Lung nodules 03/16/2016  . Palpitations   . GERD (gastroesophageal reflux disease) 07/30/2015  . Bronchiectasis without acute exacerbation ([place]) 07/08/2015  . COPD (chronic obstructive pulmonary disease) ([place]) 06/04/2015  . Fatigue 06/04/2015    Jeral Pinch PT  11/29/2018, 9:41 AM  [place] [id number] [place], [place], [address] Phone: [phone]   Fax:  [phone]  Name: Hannah Barron MRN: [id number] Date of Birth: May 26, 1970

## 2018-12-03 ENCOUNTER — Other Ambulatory Visit (HOSPITAL_COMMUNITY)
Admission: RE | Admit: 2018-12-03 | Discharge: 2018-12-03 | Disposition: A | Discharge status: Home / Self Care | Payer: Medicare HMO | Source: Ambulatory Visit | Attending: Family Medicine | Admitting: Family Medicine

## 2018-12-03 ENCOUNTER — Other Ambulatory Visit: Payer: Self-pay

## 2018-12-03 ENCOUNTER — Ambulatory Visit (INDEPENDENT_AMBULATORY_CARE_PROVIDER_SITE_OTHER): Payer: Medicare HMO

## 2018-12-03 DIAGNOSIS — N898 Other specified noninflammatory disorders of vagina: Secondary | ICD-10-CM

## 2018-12-03 NOTE — Progress Notes (Signed)
Pt here today with vaginal discharge with itching.  Pt explained how to obtain self swab and that it will take approximately 24-48 hrs for results.  I advised pt that we will call her with abnormal results.  Pt reports that is having vaginal bleeding s/p hysterectomy after intercourse and when she is wiping she gets some blood tinge on tissue.  Pt also states that she has intermittent pelvic pain.  I advised pt that I can help with her vaginal discharge concern if she could please schedule an appt with a provider.  Pt verbalized understanding.    Mel Almond, RN 12/03/18

## 2018-12-04 ENCOUNTER — Telehealth: Payer: Self-pay | Admitting: *Deleted

## 2018-12-04 LAB — CERVICOVAGINAL ANCILLARY ONLY
Bacterial Vaginitis (gardnerella): POSITIVE — AB
Candida Glabrata: NEGATIVE
Candida Vaginitis: POSITIVE — AB
Chlamydia: NEGATIVE
Comment: NEGATIVE
Comment: NEGATIVE
Comment: NEGATIVE
Comment: NEGATIVE
Comment: NEGATIVE
Comment: NORMAL
Neisseria Gonorrhea: NEGATIVE
Trichomonas: NEGATIVE

## 2018-12-04 MED ORDER — METRONIDAZOLE 500 MG PO TABS: 500.0000 mg | ORAL_TABLET | Freq: Two times a day (BID) | ORAL | 0 refills | Status: DC

## 2018-12-04 MED ORDER — FLUCONAZOLE 150 MG PO TABS: 150.0000 mg | ORAL_TABLET | Freq: Once | ORAL | 0 refills | Status: AC

## 2018-12-04 NOTE — Telephone Encounter (Signed)
Pt notified of abnormal results (+BV and +Yeast) from self swab and treatment has been prescribed. Pt advised to complete metronidazole first and not to drink alcohol while taking. Once metronidazole is completed, she then can take fluconazole. Pt voiced understanding.

## 2018-12-05 ENCOUNTER — Encounter: Payer: Self-pay | Admitting: Physician Assistant

## 2018-12-11 ENCOUNTER — Other Ambulatory Visit: Payer: Self-pay

## 2018-12-11 ENCOUNTER — Ambulatory Visit (INDEPENDENT_AMBULATORY_CARE_PROVIDER_SITE_OTHER): Payer: Medicare HMO

## 2018-12-11 ENCOUNTER — Ambulatory Visit: Payer: Medicare HMO | Attending: Physician Assistant | Admitting: Physical Therapy

## 2018-12-11 DIAGNOSIS — M5412 Radiculopathy, cervical region: Secondary | ICD-10-CM | POA: Diagnosis not present

## 2018-12-11 DIAGNOSIS — R293 Abnormal posture: Secondary | ICD-10-CM | POA: Insufficient documentation

## 2018-12-11 DIAGNOSIS — R002 Palpitations: Secondary | ICD-10-CM | POA: Diagnosis not present

## 2018-12-11 NOTE — Therapy (Signed)
[place], [place], [address] Phone: [phone]   Fax:  [phone]  Physical Therapy Treatment  Patient Details  Name: Hannah Barron MRN: [id number] Date of Birth: April 22, 1970 Referring Provider (PT): Ferne Reus PA   Encounter Date: 12/11/2018  PT End of Session - 12/11/18 1009    Visit Number  6    PT Start Time  0930    PT Stop Time  1025    PT Time Calculation (min)  55 min       Past Medical History:  Diagnosis Date  . Anemia   . Bronchiectasis ([place])   . COPD (chronic obstructive pulmonary disease) ([place])   . GERD (gastroesophageal reflux disease)   . Migraines   . Paroxysmal SVT (supraventricular tachycardia) ([place])    a. diagnosed in 11/2015.  . Right leg numbness     Past Surgical History:  Procedure Laterality Date  . CESAREAN SECTION    . CYSTOSCOPY N/A 04/05/2016   Procedure: CYSTOSCOPY;  Surgeon: Lavonia Drafts, MD;  Location: [facility];  Service: Gynecology;  Laterality: N/A;  . LAPAROSCOPIC VAGINAL HYSTERECTOMY WITH SALPINGECTOMY Bilateral 04/05/2016   Procedure: LAPAROSCOPIC ASSISTED VAGINAL HYSTERECTOMY WITH SALPINGECTOMY;  Surgeon: Lavonia Drafts, MD;  Location: [facility];  Service: Gynecology;  Laterality: Bilateral;  . LUNG BIOPSY    . VIDEO BRONCHOSCOPY Bilateral 03/29/2016   Procedure: VIDEO BRONCHOSCOPY WITHOUT FLUORO;  Surgeon: Marshell Garfinkel, MD;  [facility];  Service: Cardiopulmonary;  Laterality: Bilateral;    There were no vitals filed for this visit.  Subjective Assessment - 12/11/18 0926    Subjective  "not feeling great today"    Currently in Pain?  Yes    Pain Score  9     Pain Location  Neck                       [facility] Adult PT Treatment/Exercise - 12/11/18 0001      Neck Exercises: Machines for Strengthening   UBE (Upper Arm Bike)  L2x6' alt FWD/BWD    Cybex Row  20# 2x10    Lat Pull  20# 2x10       Shoulder Exercises: Seated   Other Seated Exercises  OHP and chest press 2x 10, red weighted ball      Shoulder Exercises: Standing   External Rotation  Strengthening;Both;20 reps;Theraband    Theraband Level (Shoulder External Rotation)  Level 3 (Green)    Internal Rotation  Strengthening;Both;20 reps;Theraband    Theraband Level (Shoulder Internal Rotation)  Level 3 (Green)    Haematologist;Theraband;20 reps;Both    Theraband Level (Shoulder Extension)  Level 3 (Green)    Row  Strengthening;Both;20 reps;Theraband    Theraband Level (Shoulder Row)  Level 3 (Green)    Other Standing Exercises  scapular stabilization, all directions x 10    Other Standing Exercises  shrugs and back rolls 2 x 10, 3#      Electrical Stimulation   Electrical Stimulation Location  R upper trap    Electrical Stimulation Action  IFC    Electrical Stimulation Parameters  sitting    Electrical Stimulation Goals  Pain      Manual Therapy   Manual Therapy  Soft tissue mobilization    Manual therapy comments  R upper traps, mutiple TP and very tight/tender               PT Short Term Goals - 11/15/18  1015      PT SHORT TERM GOAL #1   Title  Pt will be independent with her initial HEP within 1 week.    Status  Achieved      PT SHORT TERM GOAL #2   Title  Pt will be able to pace her breathe during activities to decrease accessory muscle use and decrease need for rest breaks to catch her breathe during exercise as demonstrated by no episodes of holding the breathe or requested rest breaks within 2 weeks.    Status  Achieved        PT Long Term Goals - 11/29/18 [id number]      PT LONG TERM GOAL #1   Title  Pt will report pain 4/10 within 4 weeks in her R cervical spine and shoulder in order to improve quality of life.    Baseline  pain goes up to 6/10 occassionally    Status  On-going      PT LONG TERM GOAL #2   Title  Pt will improve cervical ROM to 45 degrees R/L side bending within 4  weeks in order to improve functional ROM and demonstrate improve muscle extensibility      PT LONG TERM GOAL #3   Title  Pt will improve Bil shoulder strength to 4+/5 globally within 4 weeks to demonstrate improved functional strength.    Status  On-going            Plan - 12/11/18 1010    Clinical Impression Statement  pt was very tearful during session. she currently had no place to live. pt was given a list of subsidized apartments in town. pt able to progress to green theraband with standing shoulder exercises. pt is still tight in the R upper trap. pt needs tactile cues with ER to increase form and technique.    Personal Factors and Comorbidities  Comorbidity 2;Behavior Pattern;Time since onset of injury/illness/exacerbation    Comorbidities  COPD, Proxismal SVT    Stability/Clinical Decision Making  Stable/Uncomplicated    Rehab Potential  Good    PT Frequency  2x / week    PT Duration  4 weeks    PT Treatment/Interventions  Electrical Stimulation;Moist Heat;Functional mobility training;Therapeutic activities;Therapeutic exercise;Balance training;Neuromuscular re-education;Patient/family education;Manual techniques;Passive range of motion;Taping    PT Next Visit Plan  postural strengthening and STM       Patient will benefit from skilled therapeutic intervention in order to improve the following deficits and impairments:  Decreased range of motion, Decreased strength, Pain  Visit Diagnosis: Right cervical radiculopathy  Abnormal posture     Problem List Patient Active Problem List   Diagnosis Date Noted  . Vitamin D deficiency 10/01/2018  . HLD (hyperlipidemia) 10/01/2018  . Hyperglycemia 10/01/2018  . Right cervical radiculopathy 10/01/2018  . Trapezius muscle strain, right, initial encounter 10/01/2018  . Right carpal tunnel syndrome 10/01/2018  . Cerumen impaction 09/07/2017  . Acne vulgaris 12/28/2016  . Bronchiectasis with acute exacerbation ([place]) 12/22/2016   . Medicare annual wellness visit, subsequent 04/28/2016  . Encounter for general adult medical examination with abnormal findings 04/28/2016  . Lung nodules 03/16/2016  . Palpitations   . GERD (gastroesophageal reflux disease) 07/30/2015  . Bronchiectasis without acute exacerbation ([place]) 07/08/2015  . COPD (chronic obstructive pulmonary disease) ([place]) 06/04/2015  . Fatigue 06/04/2015    Barrett Henle, [place] 12/11/2018, 10:14 AM  [place], [place], [address] Phone: [phone]  Fax:  [phone]  Name: Hannah Barron MRN: [id number] Date of Birth: [birth year]

## 2018-12-12 ENCOUNTER — Telehealth: Payer: Self-pay | Admitting: *Deleted

## 2018-12-13 ENCOUNTER — Other Ambulatory Visit: Payer: Self-pay | Admitting: Cardiology

## 2018-12-13 ENCOUNTER — Other Ambulatory Visit: Payer: Self-pay

## 2018-12-13 ENCOUNTER — Ambulatory Visit: Payer: Medicare HMO | Admitting: Physical Therapy

## 2018-12-13 DIAGNOSIS — R293 Abnormal posture: Secondary | ICD-10-CM | POA: Diagnosis not present

## 2018-12-13 DIAGNOSIS — M5412 Radiculopathy, cervical region: Secondary | ICD-10-CM | POA: Diagnosis not present

## 2018-12-13 NOTE — Therapy (Signed)
[place], [place], [address] Phone: [phone]   Fax:  [phone]  Physical Therapy Treatment  Patient Details  Name: Hannah Barron MRN: [id number] Date of Birth: [birth year] Referring Provider (PT): Ferne Reus [place]   Encounter Date: 12/13/2018  PT End of Session - 12/13/18 1013    Visit Number  7    Date for PT Re-Evaluation  12/13/18    PT Start Time  0932    PT Stop Time  1028    PT Time Calculation (min)  56 min       Past Medical History:  Diagnosis Date  . Anemia   . Bronchiectasis ([place])   . COPD (chronic obstructive pulmonary disease) ([place])   . GERD (gastroesophageal reflux disease)   . Migraines   . Paroxysmal SVT (supraventricular tachycardia) ([place])    a. diagnosed in 11/2015.  . Right leg numbness     Past Surgical History:  Procedure Laterality Date  . CESAREAN SECTION    . CYSTOSCOPY N/A 04/05/2016   Procedure: CYSTOSCOPY;  Surgeon: Lavonia Drafts, MD;  Location: [facility];  Service: Gynecology;  Laterality: N/A;  . LAPAROSCOPIC VAGINAL HYSTERECTOMY WITH SALPINGECTOMY Bilateral 04/05/2016   Procedure: LAPAROSCOPIC ASSISTED VAGINAL HYSTERECTOMY WITH SALPINGECTOMY;  Surgeon: Lavonia Drafts, MD;  Location: [facility];  Service: Gynecology;  Laterality: Bilateral;  . LUNG BIOPSY    . VIDEO BRONCHOSCOPY Bilateral 03/29/2016   Procedure: VIDEO BRONCHOSCOPY WITHOUT FLUORO;  Surgeon: Marshell Garfinkel, MD;  [facility];  Service: Cardiopulmonary;  Laterality: Bilateral;    There were no vitals filed for this visit.  Subjective Assessment - 12/13/18 0934    Subjective  "feelin okay"    Currently in Pain?  Yes    Pain Score  8     Pain Location  Neck    Pain Orientation  Left                       [facility] Adult PT Treatment/Exercise - 12/13/18 0001      Neck Exercises: Machines for Strengthening   Nustep  L4 x 6 min    Cybex Row  20# 2 x 15     Lat Pull  20# 2x15      Neck Exercises: Standing   Neck Retraction  20 reps;3 secs      Shoulder Exercises: Standing   Extension  Strengthening;Theraband;20 reps;Both    Theraband Level (Shoulder Extension)  Level 3 (Green)    Other Standing Exercises  scapular stabilization, all directions x 10, 2#    Other Standing Exercises  ball lift over head at the wall      Electrical Stimulation   Electrical Stimulation Location  R upper trap    Electrical Stimulation Action  IFC    Electrical Stimulation Parameters  sitting    Electrical Stimulation Goals  Pain      Manual Therapy   Manual Therapy  Soft tissue mobilization    Manual therapy comments  R upper traps, mutiple TP and very tight/tender               PT Short Term Goals - 11/15/18 1015      PT SHORT TERM GOAL #1   Title  Pt will be independent with her initial HEP within 1 week.    Status  Achieved      PT SHORT TERM GOAL #2   Title  Pt will be able to  pace her breathe during activities to decrease accessory muscle use and decrease need for rest breaks to catch her breathe during exercise as demonstrated by no episodes of holding the breathe or requested rest breaks within 2 weeks.    Status  Achieved        PT Long Term Goals - 12/13/18 0944      PT LONG TERM GOAL #1   Title  Pt will report pain 4/10 within 4 weeks in her R cervical spine and shoulder in order to improve quality of life.    Status  Partially Met            Plan - 12/13/18 1014    Clinical Impression Statement  pt was in a much better mood today. she was able to increase reps with rows and lat pulls. pt able to do ball lifts with back on the wall. she was able to add weight to scapular stablization. pt is less tense with STM, but still has multiple TP. pt to be renewed and frequency cut back to once a week due to cost of co-pay.    Personal Factors and Comorbidities  Comorbidity 2;Behavior Pattern;Time since onset of  injury/illness/exacerbation    Comorbidities  COPD, Proxismal SVT    Examination-Activity Limitations  Carry    Examination-Participation Restrictions  Laundry    Stability/Clinical Decision Making  Stable/Uncomplicated    Rehab Potential  Good    PT Frequency  2x / week    PT Duration  4 weeks    PT Treatment/Interventions  Electrical Stimulation;Moist Heat;Functional mobility training;Therapeutic activities;Therapeutic exercise;Balance training;Neuromuscular re-education;Patient/family education;Manual techniques;Passive range of motion;Taping    PT Next Visit Plan  pt frequency to be reduced to 1x a week       Patient will benefit from skilled therapeutic intervention in order to improve the following deficits and impairments:  Decreased range of motion, Decreased strength, Pain  Visit Diagnosis: Right cervical radiculopathy     Problem List Patient Active Problem List   Diagnosis Date Noted  . Vitamin D deficiency 10/01/2018  . HLD (hyperlipidemia) 10/01/2018  . Hyperglycemia 10/01/2018  . Right cervical radiculopathy 10/01/2018  . Trapezius muscle strain, right, initial encounter 10/01/2018  . Right carpal tunnel syndrome 10/01/2018  . Cerumen impaction 09/07/2017  . Acne vulgaris 12/28/2016  . Bronchiectasis with acute exacerbation ([place]) 12/22/2016  . Medicare annual wellness visit, subsequent 04/28/2016  . Encounter for general adult medical examination with abnormal findings 04/28/2016  . Lung nodules 03/16/2016  . Palpitations   . GERD (gastroesophageal reflux disease) 07/30/2015  . Bronchiectasis without acute exacerbation ([place]) 07/08/2015  . COPD (chronic obstructive pulmonary disease) ([place]) 06/04/2015  . Fatigue 06/04/2015    Barrett Henle, [place] 12/13/2018, 10:18 AM  [place], [place], [address] Phone: [phone]   Fax:  [phone]  Name: Hannah Barron MRN:  [id number] Date of Birth: 04-07-1970

## 2018-12-17 DIAGNOSIS — M542 Cervicalgia: Secondary | ICD-10-CM | POA: Diagnosis not present

## 2018-12-18 NOTE — Telephone Encounter (Signed)
Patient was able to apply the monitor and everything is working fine.

## 2018-12-21 ENCOUNTER — Other Ambulatory Visit: Payer: Self-pay

## 2018-12-21 ENCOUNTER — Ambulatory Visit: Payer: Medicare HMO | Admitting: Physical Therapy

## 2018-12-21 DIAGNOSIS — R293 Abnormal posture: Secondary | ICD-10-CM

## 2018-12-21 DIAGNOSIS — M5412 Radiculopathy, cervical region: Secondary | ICD-10-CM

## 2018-12-21 NOTE — Therapy (Signed)
[facility] [place], [place], [address] Phone: [phone]   Fax:  [phone]  Physical Therapy Treatment  Patient Details  Name: Hannah Barron MRN: [id number] Date of Birth: Jun 17, 1970 Referring Provider (PT): Ferne Reus [place]   Encounter Date: 12/21/2018  PT End of Session - 12/21/18 1002    Visit Number  8    Number of Visits  9    Date for PT Re-Evaluation  01/14/19    PT Start Time  0928    PT Stop Time  1020    PT Time Calculation (min)  52 min       Past Medical History:  Diagnosis Date  . Anemia   . Bronchiectasis ([place])   . COPD (chronic obstructive pulmonary disease) ([place])   . GERD (gastroesophageal reflux disease)   . Migraines   . Paroxysmal SVT (supraventricular tachycardia) ([place])    a. diagnosed in 11/2015.  . Right leg numbness     Past Surgical History:  Procedure Laterality Date  . CESAREAN SECTION    . CYSTOSCOPY N/A 04/05/2016   Procedure: CYSTOSCOPY;  Surgeon: Lavonia Drafts, MD;  Location: [facility];  Service: Gynecology;  Laterality: N/A;  . LAPAROSCOPIC VAGINAL HYSTERECTOMY WITH SALPINGECTOMY Bilateral 04/05/2016   Procedure: LAPAROSCOPIC ASSISTED VAGINAL HYSTERECTOMY WITH SALPINGECTOMY;  Surgeon: Lavonia Drafts, MD;  Location: [facility];  Service: Gynecology;  Laterality: Bilateral;  . LUNG BIOPSY    . VIDEO BRONCHOSCOPY Bilateral 03/29/2016   Procedure: VIDEO BRONCHOSCOPY WITHOUT FLUORO;  Surgeon: Marshell Garfinkel, MD;  Location: [facility];  Service: Cardiopulmonary;  Laterality: Bilateral;    There were no vitals filed for this visit.  Subjective Assessment - 12/21/18 0930    Subjective  pain better overall > 50%, saw MD and he is ordering MRI . Less stressful an dI found a home.    Currently in Pain?  Yes    Pain Score  5     Pain Location  Neck    Pain Orientation  Left                       [facility] Adult PT Treatment/Exercise -  12/21/18 0001      Neck Exercises: Machines for Strengthening   UBE (Upper Arm Bike)  L 4 2 fwd/2 back    Nustep  L4 x 6 min    Cybex Row  20# 2 x 15    Lat Pull  20# 2x15      Shoulder Exercises: Standing   Flexion  Strengthening;Both;20 reps;Weights    Shoulder Flexion Weight (lbs)  3    ABduction  Strengthening;Both;20 reps;Weights    Shoulder ABduction Weight (lbs)  2    Other Standing Exercises  PNF 2 # 2 sets 10    Other Standing Exercises  ball lift over head at the wall10 x then chest press 10x   yellow ball     Moist Heat Therapy   Number Minutes Moist Heat  15 Minutes    Moist Heat Location  Cervical;Shoulder      Electrical Stimulation   Electrical Stimulation Location  R upper trap    Electrical Stimulation Action  IFC    Electrical Stimulation Parameters  sititng    Electrical Stimulation Goals  Pain      Manual Therapy   Manual Therapy  Soft tissue mobilization    Manual therapy comments  R upper traps, tight/tender  PT Short Term Goals - 11/15/18 1015      PT SHORT TERM GOAL #1   Title  Pt will be independent with her initial HEP within 1 week.    Status  Achieved      PT SHORT TERM GOAL #2   Title  Pt will be able to pace her breathe during activities to decrease accessory muscle use and decrease need for rest breaks to catch her breathe during exercise as demonstrated by no episodes of holding the breathe or requested rest breaks within 2 weeks.    Status  Achieved        PT Long Term Goals - 12/21/18 0950      PT LONG TERM GOAL #1   Title  Pt will report pain 4/10 within 4 weeks in her R cervical spine and shoulder in order to improve quality of life.    Status  Partially Met      PT LONG TERM GOAL #2   Title  Pt will improve cervical ROM to 45 degrees R/L side bending within 4 weeks in order to improve functional ROM and demonstrate improve muscle extensibility    Baseline  cerv ROM WFLs , slight rt deviation and fwd head     Status  Achieved      PT LONG TERM GOAL #3   Title  Pt will improve Bil shoulder strength to 4+/5 globally within 4 weeks to demonstrate improved functional strength.    Baseline  RT 4/5, left 4+/5    Status  Partially Met            Plan - 12/21/18 1002    Clinical Impression Statement  pt is making progress towards all  LTG, cerv ROM is WFLS but does have slight rt deviation and holds head and shld fwd, pt needs cuing with ex for posture and to look up. Strength is progressing but both UE show weakness RT>Left.    PT Treatment/Interventions  Electrical Stimulation;Moist Heat;Functional mobility training;Therapeutic activities;Therapeutic exercise;Balance training;Neuromuscular re-education;Patient/family education;Manual techniques;Passive range of motion;Taping    PT Next Visit Plan  progress strength of UE, coninue postural education to avoid pain returning       Patient will benefit from skilled therapeutic intervention in order to improve the following deficits and impairments:  Decreased range of motion, Decreased strength, Pain  Visit Diagnosis: Abnormal posture  Right cervical radiculopathy     Problem List Patient Active Problem List   Diagnosis Date Noted  . Vitamin D deficiency 10/01/2018  . HLD (hyperlipidemia) 10/01/2018  . Hyperglycemia 10/01/2018  . Right cervical radiculopathy 10/01/2018  . Trapezius muscle strain, right, initial encounter 10/01/2018  . Right carpal tunnel syndrome 10/01/2018  . Cerumen impaction 09/07/2017  . Acne vulgaris 12/28/2016  . Bronchiectasis with acute exacerbation ([place]) 12/22/2016  . Medicare annual wellness visit, subsequent 04/28/2016  . Encounter for general adult medical examination with abnormal findings 04/28/2016  . Lung nodules 03/16/2016  . Palpitations   . GERD (gastroesophageal reflux disease) 07/30/2015  . Bronchiectasis without acute exacerbation ([place]) 07/08/2015  . COPD (chronic obstructive pulmonary  disease) ([place]) 06/04/2015  . Fatigue 06/04/2015    [name],ANGIE PTA 12/21/2018, 10:05 AM  [facility] [place], [place], [address] Phone: [phone]   Fax:  [phone]  Name: Hannah Barron MRN: [id number] Date of Birth: 02-11-1970

## 2018-12-25 ENCOUNTER — Ambulatory Visit: Payer: Medicare HMO | Admitting: Physical Therapy

## 2018-12-25 ENCOUNTER — Other Ambulatory Visit: Payer: Self-pay

## 2018-12-25 DIAGNOSIS — R293 Abnormal posture: Secondary | ICD-10-CM | POA: Diagnosis not present

## 2018-12-25 DIAGNOSIS — M5412 Radiculopathy, cervical region: Secondary | ICD-10-CM

## 2018-12-25 NOTE — Therapy (Signed)
[facility] [place], [place], [address] Phone: [phone]   Fax:  [phone]  Physical Therapy Treatment  Patient Details  Name: Hannah Barron MRN: [id number] Date of Birth: 03-18-1970 Referring Provider (PT): Ferne Reus [place]   Encounter Date: 12/25/2018  PT End of Session - 12/25/18 1008    Visit Number  9    Number of Visits  9    Date for PT Re-Evaluation  01/14/19    PT Start Time  0930    PT Stop Time  1019    PT Time Calculation (min)  49 min       Past Medical History:  Diagnosis Date  . Anemia   . Bronchiectasis ([place])   . COPD (chronic obstructive pulmonary disease) ([place])   . GERD (gastroesophageal reflux disease)   . Migraines   . Paroxysmal SVT (supraventricular tachycardia) ([place])    a. diagnosed in 11/2015.  . Right leg numbness     Past Surgical History:  Procedure Laterality Date  . CESAREAN SECTION    . CYSTOSCOPY N/A 04/05/2016   Procedure: CYSTOSCOPY;  Surgeon: Lavonia Drafts, MD;  Location: [facility];  Service: Gynecology;  Laterality: N/A;  . LAPAROSCOPIC VAGINAL HYSTERECTOMY WITH SALPINGECTOMY Bilateral 04/05/2016   Procedure: LAPAROSCOPIC ASSISTED VAGINAL HYSTERECTOMY WITH SALPINGECTOMY;  Surgeon: Lavonia Drafts, MD;  Location: [facility];  Service: Gynecology;  Laterality: Bilateral;  . LUNG BIOPSY    . VIDEO BRONCHOSCOPY Bilateral 03/29/2016   Procedure: VIDEO BRONCHOSCOPY WITHOUT FLUORO;  Surgeon: Marshell Garfinkel, MD;  [facility];  Service: Cardiopulmonary;  Laterality: Bilateral;    There were no vitals filed for this visit.  Subjective Assessment - 12/25/18 0943    Subjective  "feeling okay, stomach has been hurting the past couple days"    Currently in Pain?  Yes    Pain Score  5     Pain Location  Neck    Pain Orientation  Left                       [facility] Adult PT Treatment/Exercise - 12/25/18 0001      Neck Exercises:  Machines for Strengthening   UBE (Upper Arm Bike)  L 4 3 fwd/3 back    Cybex Row  20# 2 x 15    Lat Pull  20# 2x15      Shoulder Exercises: Standing   External Rotation  Strengthening;Both;20 reps;Theraband    Theraband Level (Shoulder External Rotation)  Level 2 (Red)    Internal Rotation  Strengthening;Both;20 reps;Theraband    Theraband Level (Shoulder Internal Rotation)  Level 2 (Red)    Flexion  Strengthening;Both;20 reps;Weights    Shoulder Flexion Weight (lbs)  2    ABduction  Strengthening;Both;20 reps;Weights    Shoulder ABduction Weight (lbs)  2    Other Standing Exercises  ball lift over head at the wall10 x then chest press 10x      Electrical Stimulation   Electrical Stimulation Location  R upper trap    Electrical Stimulation Action  IFC    Electrical Stimulation Parameters  sitting    Electrical Stimulation Goals  Pain      Manual Therapy   Manual Therapy  Soft tissue mobilization    Manual therapy comments  R upper traps, tight/tender               PT Short Term Goals - 11/15/18 1015  PT SHORT TERM GOAL #1   Title  Pt will be independent with her initial HEP within 1 week.    Status  Achieved      PT SHORT TERM GOAL #2   Title  Pt will be able to pace her breathe during activities to decrease accessory muscle use and decrease need for rest breaks to catch her breathe during exercise as demonstrated by no episodes of holding the breathe or requested rest breaks within 2 weeks.    Status  Achieved        PT Long Term Goals - 12/25/18 1115      PT LONG TERM GOAL #1   Title  Pt will report pain 4/10 within 4 weeks in her R cervical spine and shoulder in order to improve quality of life.    Status  Partially Met      PT LONG TERM GOAL #2   Title  Pt will improve cervical ROM to 45 degrees R/L side bending within 4 weeks in order to improve functional ROM and demonstrate improve muscle extensibility      PT LONG TERM GOAL #3   Title  Pt will  improve Bil shoulder strength to 4+/5 globally within 4 weeks to demonstrate improved functional strength.    Status  Partially Met            Plan - 12/25/18 1012    Clinical Impression Statement  pt arrived stating that her stomach was cramps. pt able to complete some exercises. she said that chest press and OHP increased her stomach pain. tightness is decreasing in the R upper traps with STM and stim. pt is still weak in the right shoulder.    Personal Factors and Comorbidities  Comorbidity 2;Behavior Pattern;Time since onset of injury/illness/exacerbation    Comorbidities  COPD, Proxismal SVT    Examination-Activity Limitations  Carry    Examination-Participation Restrictions  Laundry    Stability/Clinical Decision Making  Stable/Uncomplicated    Rehab Potential  Good    PT Frequency  2x / week    PT Duration  4 weeks    PT Treatment/Interventions  Electrical Stimulation;Moist Heat;Functional mobility training;Therapeutic activities;Therapeutic exercise;Balance training;Neuromuscular re-education;Patient/family education;Manual techniques;Passive range of motion;Taping    PT Next Visit Plan  progress strength of UE, coninue postural education to avoid pain returning       Patient will benefit from skilled therapeutic intervention in order to improve the following deficits and impairments:  Decreased range of motion, Decreased strength, Pain  Visit Diagnosis: Abnormal posture  Right cervical radiculopathy     Problem List Patient Active Problem List   Diagnosis Date Noted  . Vitamin D deficiency 10/01/2018  . HLD (hyperlipidemia) 10/01/2018  . Hyperglycemia 10/01/2018  . Right cervical radiculopathy 10/01/2018  . Trapezius muscle strain, right, initial encounter 10/01/2018  . Right carpal tunnel syndrome 10/01/2018  . Cerumen impaction 09/07/2017  . Acne vulgaris 12/28/2016  . Bronchiectasis with acute exacerbation ([place]) 12/22/2016  . Medicare annual wellness visit,  subsequent 04/28/2016  . Encounter for general adult medical examination with abnormal findings 04/28/2016  . Lung nodules 03/16/2016  . Palpitations   . GERD (gastroesophageal reflux disease) 07/30/2015  . Bronchiectasis without acute exacerbation ([place]) 07/08/2015  . COPD (chronic obstructive pulmonary disease) ([place]) 06/04/2015  . Fatigue 06/04/2015    Barrett Henle, [place] 12/25/2018, 10:30 AM  [facility] [place], [place], [address] Phone: [phone]   Fax:  [phone]  Name: Hannah Barron MRN: [id number] Date of Birth: 1970-07-01

## 2018-12-26 ENCOUNTER — Encounter: Payer: Self-pay | Admitting: Obstetrics & Gynecology

## 2018-12-26 ENCOUNTER — Other Ambulatory Visit (HOSPITAL_COMMUNITY)
Admission: RE | Admit: 2018-12-26 | Discharge: 2018-12-26 | Disposition: A | Discharge status: Home / Self Care | Payer: Medicare HMO | Source: Ambulatory Visit | Attending: Obstetrics & Gynecology | Admitting: Obstetrics & Gynecology

## 2018-12-26 ENCOUNTER — Ambulatory Visit (INDEPENDENT_AMBULATORY_CARE_PROVIDER_SITE_OTHER): Payer: Medicare HMO | Admitting: Obstetrics & Gynecology

## 2018-12-26 VITALS — BP 114/61 | HR 90 | Ht 67.0 in | Wt 169.0 lb

## 2018-12-26 DIAGNOSIS — R1030 Lower abdominal pain, unspecified: Secondary | ICD-10-CM | POA: Insufficient documentation

## 2018-12-26 MED ORDER — TRAMADOL HCL 50 MG PO TABS: 50.0000 mg | ORAL_TABLET | Freq: Four times a day (QID) | ORAL | 0 refills | Status: DC | PRN

## 2018-12-26 MED ORDER — DICLOFENAC POTASSIUM 50 MG PO TABS: 50.0000 mg | ORAL_TABLET | Freq: Three times a day (TID) | ORAL | 2 refills | Status: DC

## 2018-12-26 NOTE — Progress Notes (Signed)
Patient had hysterectomy in 2018 for fibroids. Patient is having lower abdominal pain that radiates up her left side. Patient also states that it is painful to have sexual intercourse and has bleeding after sex. Kathrene Alu RN

## 2018-12-26 NOTE — Progress Notes (Signed)
History:  48 y.o. G1P1001 here today for eval of pelvic pain. Pt reports pain in her lower abd that affects her sleeping and prevents her from having intercourse. She reports that over the last month the pain has become worse. She denies bleeding or abnormal discharge.     The following portions of the patient's history were reviewed and updated as appropriate: allergies, current medications, past family history, past medical history, past social history, past surgical history and problem list.  Review of Systems:  Pertinent items are noted in HPI.    Objective:  Physical Exam Blood pressure 114/61, pulse 90, height 5\' 7"  (1.702 m), weight 169 lb (76.7 kg), last menstrual period 03/31/2016.  CONSTITUTIONAL: Well-developed, well-nourished female in no acute distress.  HENT:  Normocephalic, atraumatic EYES: Conjunctivae and EOM are normal. No scleral icterus.  NECK: Normal range of motion SKIN: Skin is warm and dry. No rash noted. Not diaphoretic.No pallor. [place]: Alert and oriented to person, place, and time. Normal coordination.  Abd: Soft, nondistended; diffusely tender. Intermittent locations.  Pelvic: Normal appearing external genitalia; normal appearing vaginal mucosa. The cuff is well healed. There are no adnexal masses.      Assessment & Plan:  Abd pain with mild rebound  Abd/pelvic CT Cataflam 50mg  tid prn pain Tramadol 50mg  po q 6 hours prn F/u in 3 weeks or sooner prn   Total face-to-face time with patient was 20 min.  Greater than 50% was spent in counseling and coordination of care with the patient.   Trang Bouse L. Harraway-Smith, M.D., Cherlynn June

## 2018-12-28 ENCOUNTER — Other Ambulatory Visit: Payer: Self-pay | Admitting: Physician Assistant

## 2018-12-28 ENCOUNTER — Other Ambulatory Visit (HOSPITAL_COMMUNITY): Payer: Self-pay | Admitting: Physician Assistant

## 2018-12-28 DIAGNOSIS — M542 Cervicalgia: Secondary | ICD-10-CM

## 2018-12-31 ENCOUNTER — Ambulatory Visit (INDEPENDENT_AMBULATORY_CARE_PROVIDER_SITE_OTHER): Payer: Medicare HMO | Admitting: Family

## 2018-12-31 ENCOUNTER — Ambulatory Visit (INDEPENDENT_AMBULATORY_CARE_PROVIDER_SITE_OTHER): Payer: Medicare HMO

## 2018-12-31 ENCOUNTER — Other Ambulatory Visit: Payer: Self-pay | Admitting: Gastroenterology

## 2018-12-31 ENCOUNTER — Encounter: Payer: Self-pay | Admitting: Family

## 2018-12-31 ENCOUNTER — Other Ambulatory Visit: Payer: Self-pay

## 2018-12-31 ENCOUNTER — Ambulatory Visit: Payer: Medicare HMO | Admitting: Obstetrics & Gynecology

## 2018-12-31 VITALS — BP 104/82 | HR 90 | Temp 98.5°F | Ht 67.0 in | Wt 168.0 lb

## 2018-12-31 DIAGNOSIS — R109 Unspecified abdominal pain: Secondary | ICD-10-CM | POA: Diagnosis not present

## 2018-12-31 DIAGNOSIS — M545 Low back pain, unspecified: Secondary | ICD-10-CM

## 2018-12-31 DIAGNOSIS — B9689 Other specified bacterial agents as the cause of diseases classified elsewhere: Secondary | ICD-10-CM | POA: Diagnosis not present

## 2018-12-31 DIAGNOSIS — R300 Dysuria: Secondary | ICD-10-CM

## 2018-12-31 DIAGNOSIS — N760 Acute vaginitis: Secondary | ICD-10-CM

## 2018-12-31 LAB — CERVICOVAGINAL ANCILLARY ONLY
Bacterial Vaginitis (gardnerella): POSITIVE — AB
Candida Glabrata: NEGATIVE
Candida Vaginitis: NEGATIVE
Chlamydia: NEGATIVE
Comment: NEGATIVE
Comment: NEGATIVE
Comment: NEGATIVE
Comment: NEGATIVE
Comment: NEGATIVE
Comment: NORMAL
Neisseria Gonorrhea: NEGATIVE
Trichomonas: NEGATIVE

## 2018-12-31 LAB — POC URINALSYSI DIPSTICK (AUTOMATED)
Bilirubin, UA: NEGATIVE
Blood, UA: NEGATIVE
Glucose, UA: NEGATIVE
Ketones, UA: NEGATIVE
Leukocytes, UA: NEGATIVE
Nitrite, UA: NEGATIVE
Protein, UA: NEGATIVE
Spec Grav, UA: 1.02 (ref 1.010–1.025)
Urobilinogen, UA: 0.2 E.U./dL
pH, UA: 6 (ref 5.0–8.0)

## 2018-12-31 IMAGING — DX DG LUMBAR SPINE 2-3V
3 series · 3 of 3 positions shown · non-contrast
Comparison: Abdomen and pelvis CT dated [DATE].

CLINICAL DATA: Chronic low back pain. No known injury.

EXAM:
LUMBAR SPINE - 2-3 VIEW

[lumbar spine ap]
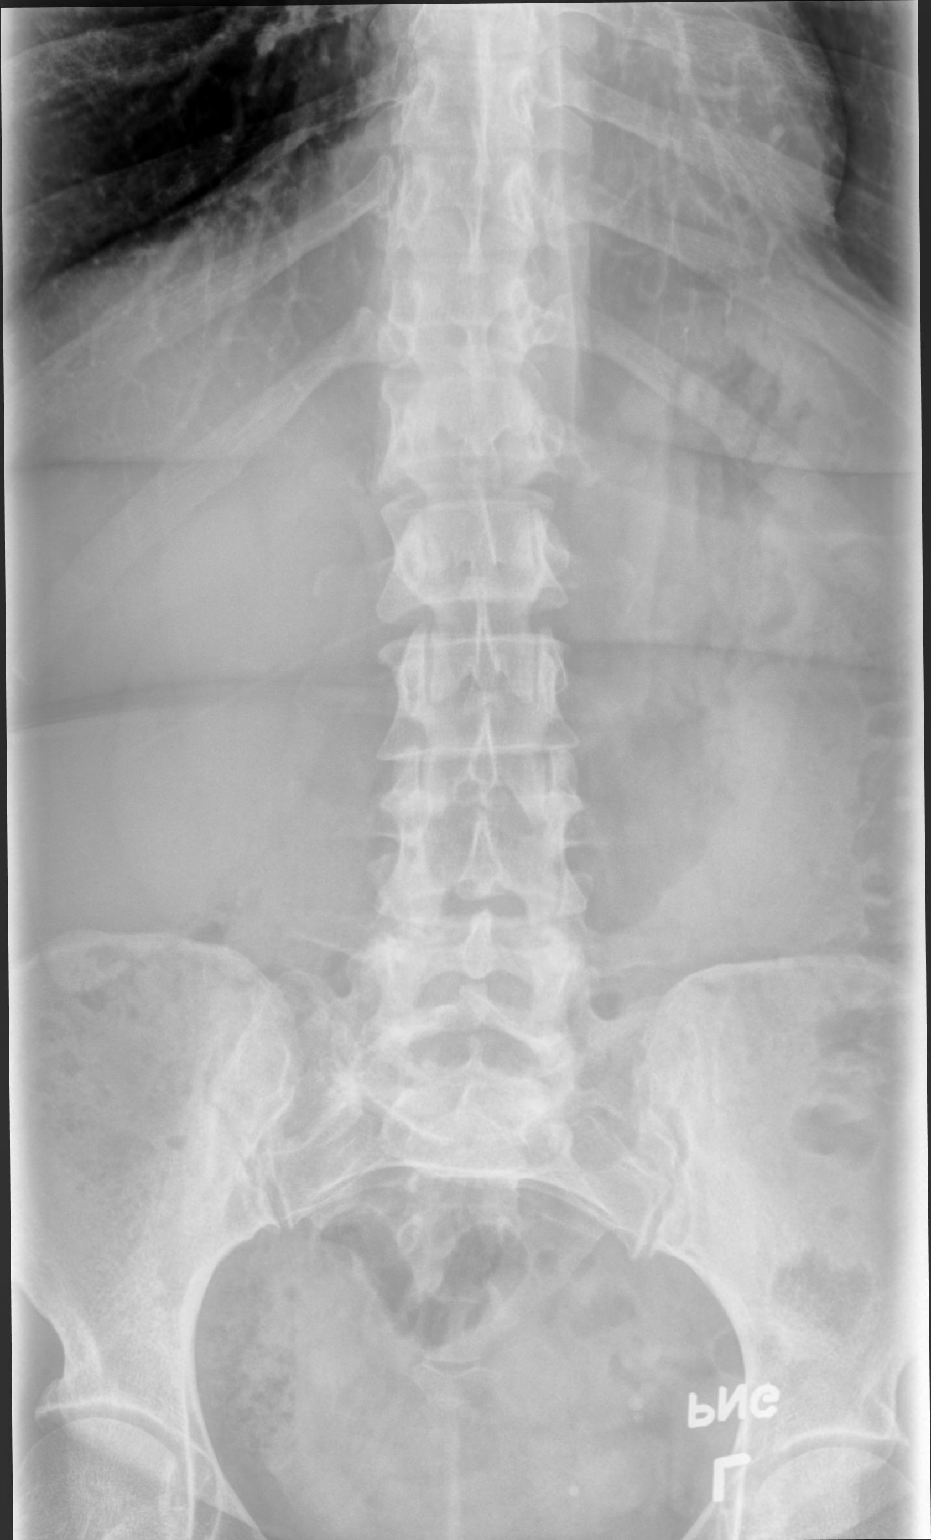

[lumbar spine lat (1 of 2)]
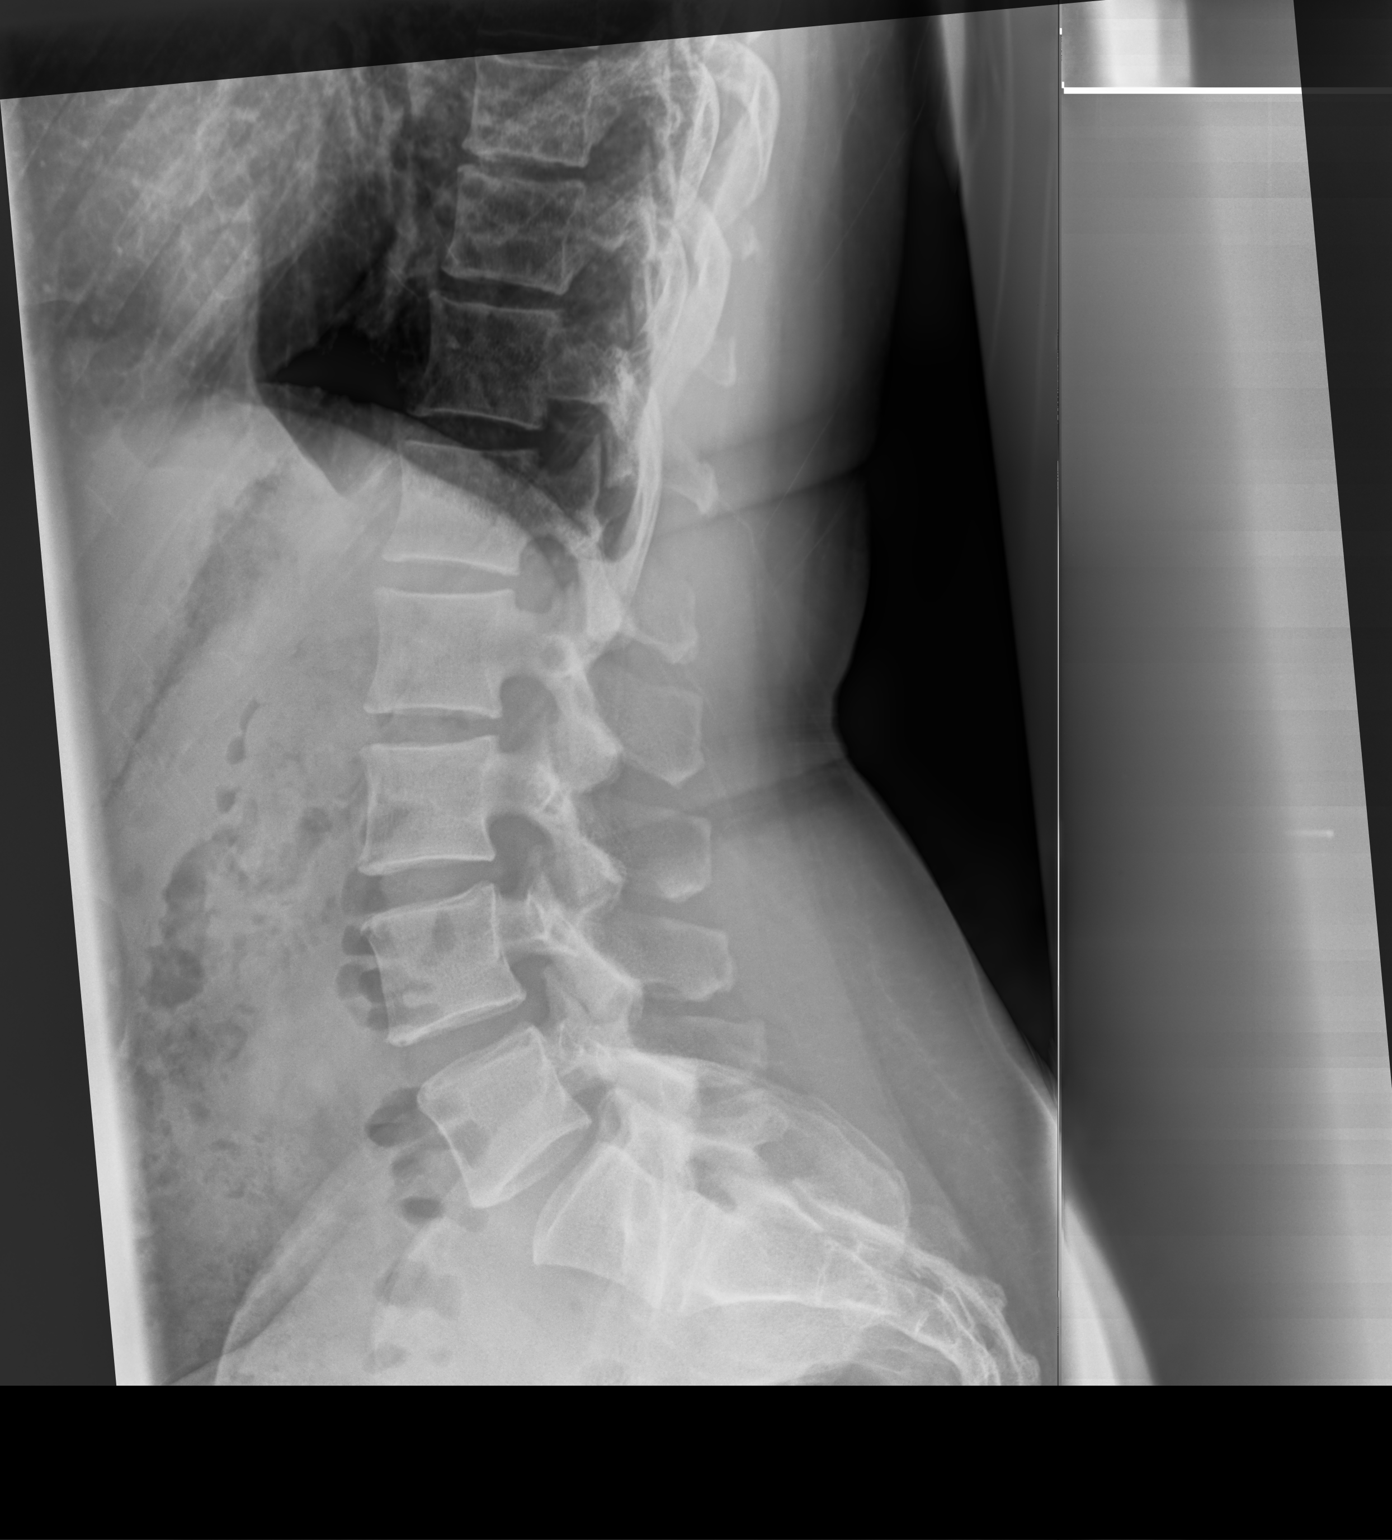

[lumbar spine lat (2 of 2)]
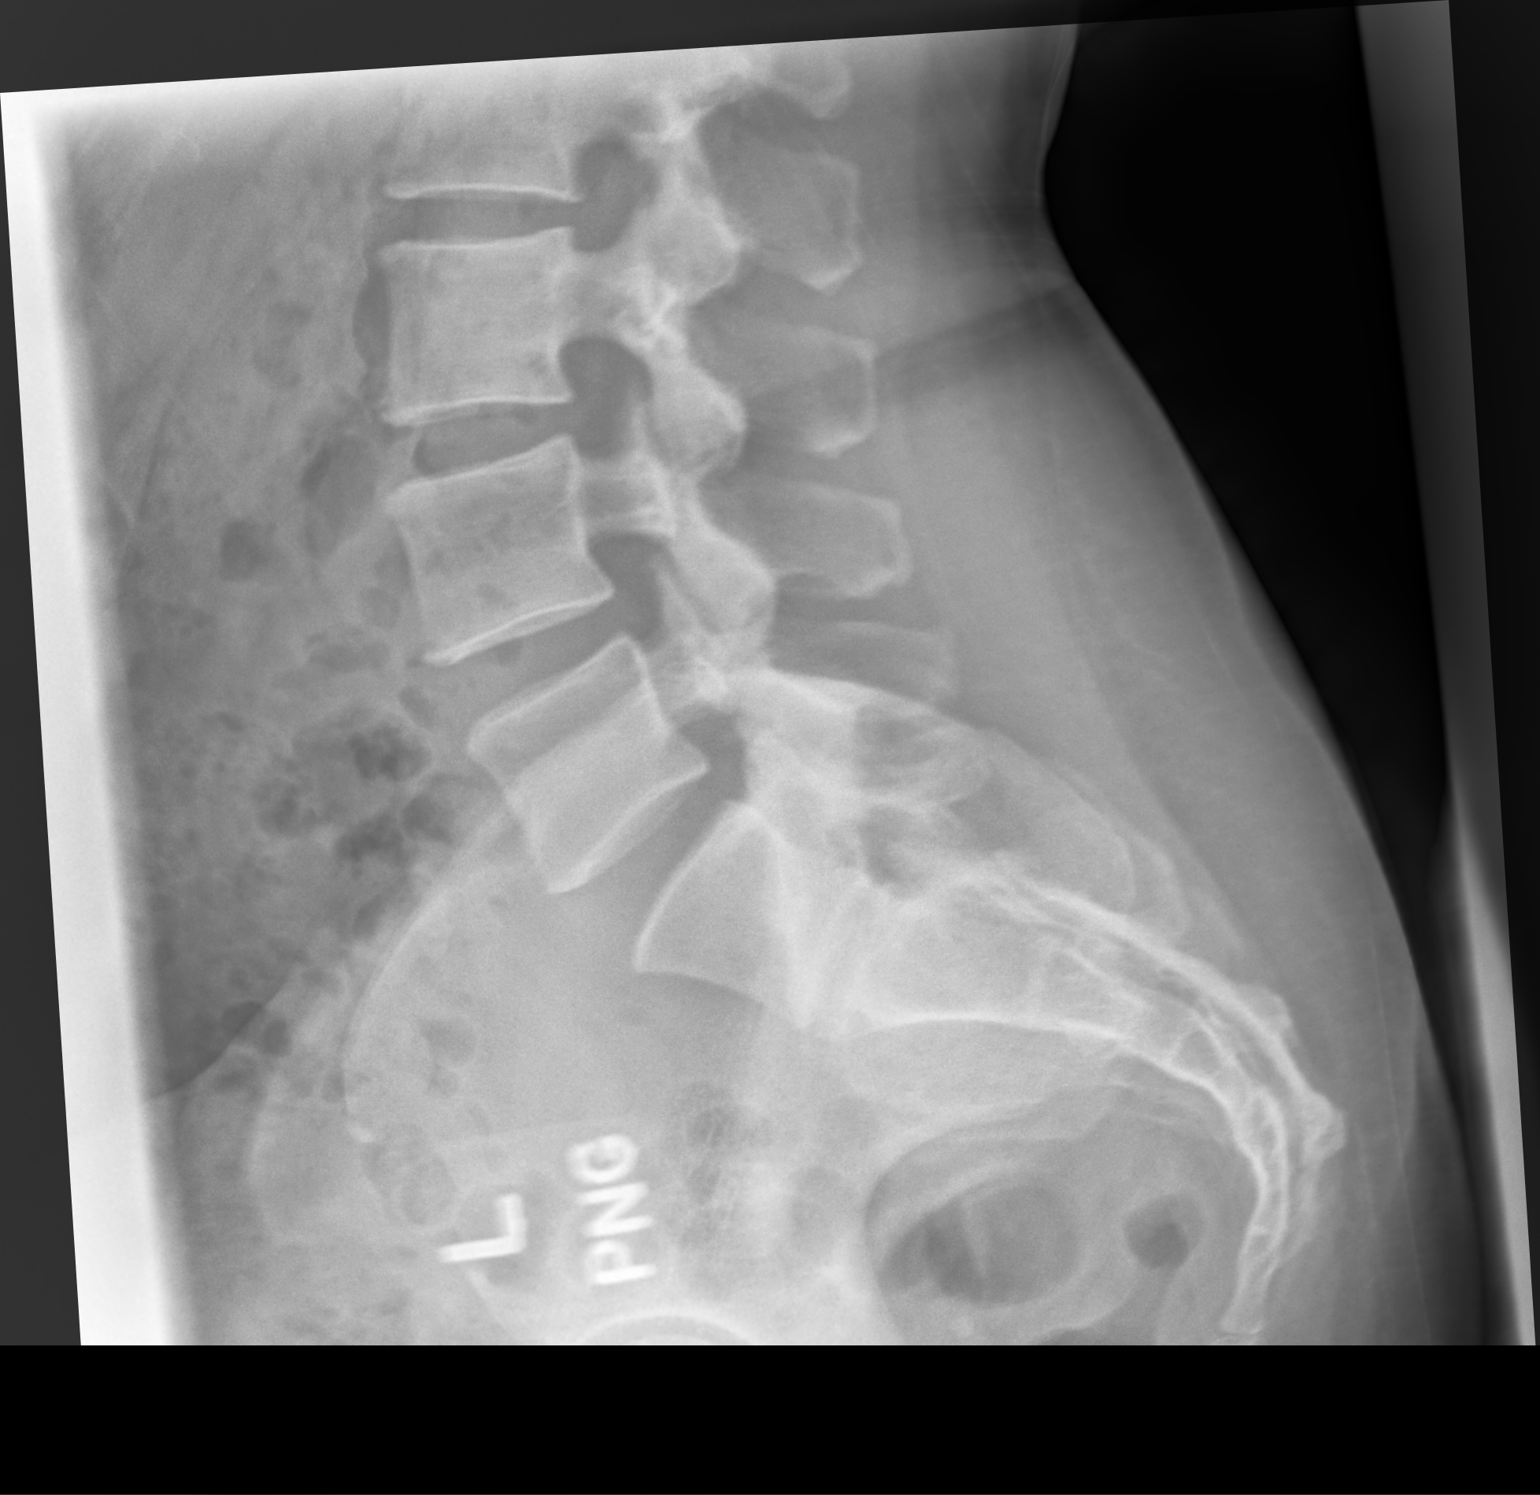

[3 of 3 positions shown; findings below may reference images not displayed]

FINDINGS: Five non-rib-bearing lumbar vertebrae. These have normal appearances
with no fractures, pars defects or subluxations.
IMPRESSION: Normal examination.

## 2018-12-31 MED ORDER — METRONIDAZOLE 500 MG PO TABS: 500.0000 mg | ORAL_TABLET | Freq: Two times a day (BID) | ORAL | 0 refills | Status: DC

## 2018-12-31 NOTE — Addendum Note (Signed)
Addended by: Marcina Millard on: 12/31/2018 01:41 PM   Modules accepted: Orders

## 2018-12-31 NOTE — Progress Notes (Signed)
Hannah Barron is a 48 y.o. female with the following history as recorded in EpicCare:  Patient Active Problem List   Diagnosis Date Noted  . Vitamin D deficiency 10/01/2018  . HLD (hyperlipidemia) 10/01/2018  . Hyperglycemia 10/01/2018  . Right cervical radiculopathy 10/01/2018  . Trapezius muscle strain, right, initial encounter 10/01/2018  . Right carpal tunnel syndrome 10/01/2018  . Cerumen impaction 09/07/2017  . Acne vulgaris 12/28/2016  . Bronchiectasis with acute exacerbation ([place]) 12/22/2016  . Medicare annual wellness visit, subsequent 04/28/2016  . Encounter for general adult medical examination with abnormal findings 04/28/2016  . Lung nodules 03/16/2016  . Palpitations   . GERD (gastroesophageal reflux disease) 07/30/2015  . Bronchiectasis without acute exacerbation ([place]) 07/08/2015  . COPD (chronic obstructive pulmonary disease) ([place]) 06/04/2015  . Fatigue 06/04/2015    Current Outpatient Medications  Medication Sig Dispense Refill  . albuterol (PROVENTIL HFA;VENTOLIN HFA) 108 (90 Base) MCG/ACT inhaler INHALE 2 PUFFS INTO THE LUNGS EVERY 6 HOURS AS NEEDED FOR WHEEZING OR SHORTNESS OF BREATH 54 Inhaler 3  . atorvastatin (LIPITOR) 20 MG tablet Take 1 tablet (20 mg total) by mouth daily. 90 tablet 3  . budesonide-formoterol (SYMBICORT) 160-4.5 MCG/ACT inhaler Inhale 2 puffs into the lungs 2 (two) times daily. 1 Inhaler 5  . CLARAVIS 40 MG capsule Take 2 capsules by mouth daily.    . diclofenac (CATAFLAM) 50 MG tablet Take 1 tablet (50 mg total) by mouth 3 (three) times daily. As needed for pain. 40 tablet 2  . dicyclomine (BENTYL) 20 MG tablet Take 20 mg by mouth every 6 (six) hours.    . metoprolol succinate (TOPROL-XL) 50 MG 24 hr tablet TAKE 1 TABLET IN MORNING & 1/2 TABLET IN EVENING *TAKE 1/2 TAB AS NEEDED FOR PALPITATIONS/RAPID HR* 60 tablet 3  . metroNIDAZOLE (FLAGYL) 500 MG tablet Take 1 tablet (500 mg total) by mouth 2 (two) times daily. 14 tablet 0  . omeprazole  (PRILOSEC) 40 MG capsule TAKE 1 CAPSULE BY MOUTH EVERY DAY 90 capsule 3  . Respiratory Therapy Supplies (FLUTTER) DEVI Use as directed. 1 each 0  . RESTASIS 0.05 % ophthalmic emulsion Instill 1 drop into both eyes two times a day  2  . traMADol (ULTRAM) 50 MG tablet Take 1 tablet (50 mg total) by mouth every 6 (six) hours as needed for severe pain. 20 tablet 0   No current facility-administered medications for this visit.    Allergies: Patient has no known allergies.  Past Medical History:  Diagnosis Date  . Anemia   . Bronchiectasis ([place])   . COPD (chronic obstructive pulmonary disease) ([place])   . GERD (gastroesophageal reflux disease)   . Migraines   . Paroxysmal SVT (supraventricular tachycardia) ([place])    a. diagnosed in 11/2015.  . Right leg numbness     Past Surgical History:  Procedure Laterality Date  . CESAREAN SECTION    . CYSTOSCOPY N/A 04/05/2016   Procedure: CYSTOSCOPY;  Surgeon: Lavonia Drafts, MD;  Location: [facility];  Service: Gynecology;  Laterality: N/A;  . LAPAROSCOPIC VAGINAL HYSTERECTOMY WITH SALPINGECTOMY Bilateral 04/05/2016   Procedure: LAPAROSCOPIC ASSISTED VAGINAL HYSTERECTOMY WITH SALPINGECTOMY;  Surgeon: Lavonia Drafts, MD;  Location: [facility];  Service: Gynecology;  Laterality: Bilateral;  . LUNG BIOPSY    . VIDEO BRONCHOSCOPY Bilateral 03/29/2016   Procedure: VIDEO BRONCHOSCOPY WITHOUT FLUORO;  Surgeon: Marshell Garfinkel, MD;  [facility];  Service: Cardiopulmonary;  Laterality: Bilateral;    Family History  Problem Relation Age of Onset  .  Healthy Mother   . Healthy Father     Social History   Tobacco Use  . Smoking status: Never Smoker  . Smokeless tobacco: Never Used  Substance Use Topics  . Alcohol use: No    Alcohol/week: 0.0 standard drinks    Subjective:  Patient has had recurrent problems with abdominal pain on and off for the past 1-2 years; complaining today of left sided flank pain that feels like it radiates to her  lower left back; saw her GYN with similar symptoms last week and is scheduled for abdominal/ pelvic CT later this week; had test for yeast/ BV done by her GYN last week- that test did show BV; patient has not been contacted by her GYN with those results yet;   Objective:  Vitals:   12/31/18 1309  BP: 104/82  Pulse: 90  Temp: 98.5 F (36.9 C)  TempSrc: Oral  SpO2: 99%  Weight: 168 lb (76.2 kg)  Height: 5\' 7"  (1.702 m)    General: Well developed, well nourished, in no acute distress  Skin : Warm and dry.  Head: Normocephalic and atraumatic  Lungs: Respirations unlabored; clear to auscultation bilaterally without wheeze, rales, rhonchi  CVS exam: normal rate and regular rhythm.  Neurologic: Alert and oriented; speech intact; face symmetrical; moves all extremities well; CNII-XII intact without focal deficit   Assessment:  1. Dysuria   2. Abdominal pain, unspecified abdominal location   3. BV (bacterial vaginosis)   4. Low back pain, unspecified back pain laterality, unspecified chronicity, unspecified whether sciatica present     Plan:  Physical exam is not remarkable; patient has had similar symptoms for almost 2 years; Check U/A- not remarkable; will update urine culture as well; will go ahead and refill Flagyl for patient today; she is encouraged to get her CT done as ordered by her GYN; will also update lumbar X-ray today since she repeatedly mentions that she is having low back pain that radiates into her left pelvic area. May also need to refer back to GI for colonoscopy.   This visit occurred during the SARS-CoV-2 public health emergency.  Safety protocols were in place, including screening questions prior to the visit, additional usage of staff PPE, and extensive cleaning of exam room while observing appropriate contact time as indicated for disinfecting solutions.    No follow-ups on file.  Orders Placed This Encounter  Procedures  . Urine Culture  . DG Lumbar Spine 2-3  Views    Order Specific Question:   Reason for Exam (SYMPTOM  OR DIAGNOSIS REQUIRED)    Answer:   low back pain    Order Specific Question:   Is patient pregnant?    Answer:   No    Order Specific Question:   Preferred imaging location?    Answer:   [facility]    Order Specific Question:   Radiology Contrast Protocol - do NOT remove file path    Answer:   [email or web address]    Requested Prescriptions   Signed Prescriptions Disp Refills  . metroNIDAZOLE (FLAGYL) 500 MG tablet 14 tablet 0    Sig: Take 1 tablet (500 mg total) by mouth 2 (two) times daily.

## 2018-12-31 NOTE — Telephone Encounter (Signed)
Medication was discontinued in March 2019. No follow up scheduled and was last seen in 3 -2019

## 2019-01-01 ENCOUNTER — Ambulatory Visit (HOSPITAL_BASED_OUTPATIENT_CLINIC_OR_DEPARTMENT_OTHER): Payer: Medicare HMO

## 2019-01-01 ENCOUNTER — Ambulatory Visit: Payer: Medicare HMO | Admitting: Physical Therapy

## 2019-01-01 DIAGNOSIS — R293 Abnormal posture: Secondary | ICD-10-CM

## 2019-01-01 DIAGNOSIS — M5412 Radiculopathy, cervical region: Secondary | ICD-10-CM

## 2019-01-01 LAB — URINE CULTURE: Result:: NO GROWTH

## 2019-01-01 NOTE — Therapy (Signed)
[place], [place], [address] Phone: [phone]   Fax:  [phone]  Physical Therapy Treatment  Patient Details  Name: Hannah Barron MRN: [id number] Date of Birth: 1970/03/21 Referring Provider (PT): Ferne Reus [place]   Encounter Date: 01/01/2019  PT End of Session - 01/01/19 1547    Visit Number  10    Date for PT Re-Evaluation  01/14/19    PT Start Time  1522    PT Stop Time  2458    PT Time Calculation (min)  53 min       Past Medical History:  Diagnosis Date  . Anemia   . Bronchiectasis ([place])   . COPD (chronic obstructive pulmonary disease) ([place])   . GERD (gastroesophageal reflux disease)   . Migraines   . Paroxysmal SVT (supraventricular tachycardia) ([place])    a. diagnosed in 11/2015.  . Right leg numbness     Past Surgical History:  Procedure Laterality Date  . CESAREAN SECTION    . CYSTOSCOPY N/A 04/05/2016   Procedure: CYSTOSCOPY;  Surgeon: Lavonia Drafts, MD;  Location: [facility];  Service: Gynecology;  Laterality: N/A;  . LAPAROSCOPIC VAGINAL HYSTERECTOMY WITH SALPINGECTOMY Bilateral 04/05/2016   Procedure: LAPAROSCOPIC ASSISTED VAGINAL HYSTERECTOMY WITH SALPINGECTOMY;  Surgeon: Lavonia Drafts, MD;  Location: [facility];  Service: Gynecology;  Laterality: Bilateral;  . LUNG BIOPSY    . VIDEO BRONCHOSCOPY Bilateral 03/29/2016   Procedure: VIDEO BRONCHOSCOPY WITHOUT FLUORO;  Surgeon: Marshell Garfinkel, MD;  Location: [facility];  Service: Cardiopulmonary;  Laterality: Bilateral;    There were no vitals filed for this visit.  Subjective Assessment - 01/01/19 1523    Subjective  100% better , able to use arm and sleep on side. CT scan for RT UE saturday    Currently in Pain?  No/denies         [facility] PT Assessment - 01/01/19 0001      Strength   Strength Assessment Site  Shoulder    Right/Left Shoulder  Right;Left    Right Shoulder Flexion  4/5    Right Shoulder  ABduction  4-/5    Right Shoulder Internal Rotation  4-/5    Right Shoulder External Rotation  4-/5    Left Shoulder Flexion  5/5    Left Shoulder ABduction  5/5    Left Shoulder Internal Rotation  5/5    Left Shoulder External Rotation  5/5                   [facility] Adult PT Treatment/Exercise - 01/01/19 0001      Neck Exercises: Machines for Strengthening   UBE (Upper Arm Bike)  L 4 3 fwd/3 back    Cybex Row  20# 2 x 15    Cybex Chest Press  10# 2 sets 10    Lat Pull  20# 2x15      Shoulder Exercises: Seated   Other Seated Exercises  OHP and chest press 10,yellow wt ball      Shoulder Exercises: Standing   External Rotation  Strengthening;Both;15 reps;Weights    External Rotation Weight (lbs)  3    Flexion  Strengthening;Both;20 reps;Weights    Shoulder Flexion Weight (lbs)  3    ABduction  Strengthening;Both;20 reps;Weights    Shoulder ABduction Weight (lbs)  3      Moist Heat Therapy   Number Minutes Moist Heat  15 Minutes    Moist Heat Location  Shoulder;Cervical  Theme park manager  R upper trap    Chartered certified accountant  IFC    Electrical Stimulation Parameters  sitting    Electrical Stimulation Goals  Other (comment)   tightness and soreness after ex              PT Short Term Goals - 11/15/18 1015      PT SHORT TERM GOAL #1   Title  Pt will be independent with her initial HEP within 1 week.    Status  Achieved      PT SHORT TERM GOAL #2   Title  Pt will be able to pace her breathe during activities to decrease accessory muscle use and decrease need for rest breaks to catch her breathe during exercise as demonstrated by no episodes of holding the breathe or requested rest breaks within 2 weeks.    Status  Achieved        PT Long Term Goals - 01/01/19 1531      PT LONG TERM GOAL #1   Title  Pt will report pain 4/10 within 4 weeks in her R cervical spine and shoulder in order to improve  quality of life.    Status  Achieved      PT LONG TERM GOAL #2   Title  Pt will improve cervical ROM to 45 degrees R/L side bending within 4 weeks in order to improve functional ROM and demonstrate improve muscle extensibility    Status  Achieved      PT LONG TERM GOAL #3   Title  Pt will improve Bil shoulder strength to 4+/5 globally within 4 weeks to demonstrate improved functional strength.    Baseline  RT 4/5, left 4+/5    Status  Partially Met            Plan - 01/01/19 1549    Clinical Impression Statement  goals met except weakness still present in RT shld ( CT this weekend) pt fatigues easily and SOB but much better than SOC. RT trap still tender with TP and some cuing for compensation but overall pt reports 100% decrease in pain.    PT Treatment/Interventions  Electrical Stimulation;Moist Heat;Functional mobility training;Therapeutic activities;Therapeutic exercise;Balance training;Neuromuscular re-education;Patient/family education;Manual techniques;Passive range of motion;Taping    PT Next Visit Plan  assess after CT. RT shld strength       Patient will benefit from skilled therapeutic intervention in order to improve the following deficits and impairments:  Decreased range of motion, Decreased strength, Pain  Visit Diagnosis: Abnormal posture  Right cervical radiculopathy     Problem List Patient Active Problem List   Diagnosis Date Noted  . Vitamin D deficiency 10/01/2018  . HLD (hyperlipidemia) 10/01/2018  . Hyperglycemia 10/01/2018  . Right cervical radiculopathy 10/01/2018  . Trapezius muscle strain, right, initial encounter 10/01/2018  . Right carpal tunnel syndrome 10/01/2018  . Cerumen impaction 09/07/2017  . Acne vulgaris 12/28/2016  . Bronchiectasis with acute exacerbation ([place]) 12/22/2016  . Medicare annual wellness visit, subsequent 04/28/2016  . Encounter for general adult medical examination with abnormal findings 04/28/2016  . Lung nodules  03/16/2016  . Palpitations   . GERD (gastroesophageal reflux disease) 07/30/2015  . Bronchiectasis without acute exacerbation ([place]) 07/08/2015  . COPD (chronic obstructive pulmonary disease) ([employer]) 06/04/2015  . Fatigue 06/04/2015    Inola Lisle,ANGIE PTA 01/01/2019, 3:50 PM  [place], [place], [address] Phone: [phone]  Fax:  [phone]  Name: Niza Soderholm MRN: [id number] Date of Birth: [birth year]

## 2019-01-02 ENCOUNTER — Telehealth: Payer: Self-pay

## 2019-01-02 ENCOUNTER — Ambulatory Visit: Payer: Medicare HMO | Admitting: Obstetrics & Gynecology

## 2019-01-02 ENCOUNTER — Other Ambulatory Visit: Payer: Self-pay | Admitting: Obstetrics & Gynecology

## 2019-01-02 ENCOUNTER — Encounter: Payer: Self-pay | Admitting: Obstetrics & Gynecology

## 2019-01-02 NOTE — Telephone Encounter (Signed)
Called pt regarding positive BV results. Pt states PCP sent Flagyl Rx to her pharmacy on 12/31/18.  Emmanuell Kantz l Seeley [place], CMA

## 2019-01-03 ENCOUNTER — Ambulatory Visit (HOSPITAL_BASED_OUTPATIENT_CLINIC_OR_DEPARTMENT_OTHER)
Admission: RE | Admit: 2019-01-03 | Discharge: 2019-01-03 | Disposition: A | Discharge status: Home / Self Care | Payer: Medicare HMO | Source: Ambulatory Visit | Attending: Obstetrics & Gynecology | Admitting: Obstetrics & Gynecology

## 2019-01-03 ENCOUNTER — Other Ambulatory Visit: Payer: Self-pay

## 2019-01-03 ENCOUNTER — Encounter (HOSPITAL_BASED_OUTPATIENT_CLINIC_OR_DEPARTMENT_OTHER): Payer: Self-pay

## 2019-01-03 DIAGNOSIS — R1030 Lower abdominal pain, unspecified: Secondary | ICD-10-CM | POA: Insufficient documentation

## 2019-01-03 DIAGNOSIS — R109 Unspecified abdominal pain: Secondary | ICD-10-CM | POA: Diagnosis not present

## 2019-01-03 IMAGING — CT CT ABD-PELV W/ CM
2 of 5 series · 15 of 46 positions shown, 17 images · IV contrast (Omnipaque)
Comparison: Abdomen/pelvis CT [DATE].  Chest CT [DATE].

CLINICAL DATA: Generalized abdominal pain. Right lower abdominal
pain radiating to the left side for 3-4 weeks.

EXAM:
CT ABDOMEN AND PELVIS WITH CONTRAST
TECHNIQUE: Multidetector CT imaging of the abdomen and pelvis was performed
using the standard protocol following bolus administration of
intravenous contrast.
CONTRAST:  100mL OMNIPAQUE IOHEXOL 300 MG/ML  SOLN

[Series 2: axial st · axial · 0.72mm/px · z∈[-471,-81]mm · 12 of 90 slices shown, 14 images]
[im 6/90  soft-tissue]
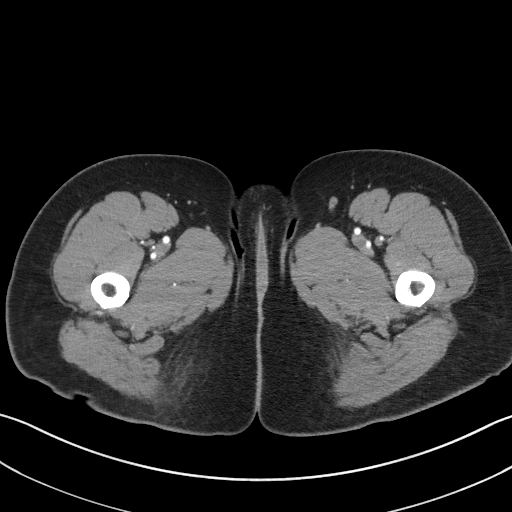
[im 6/90  bone]
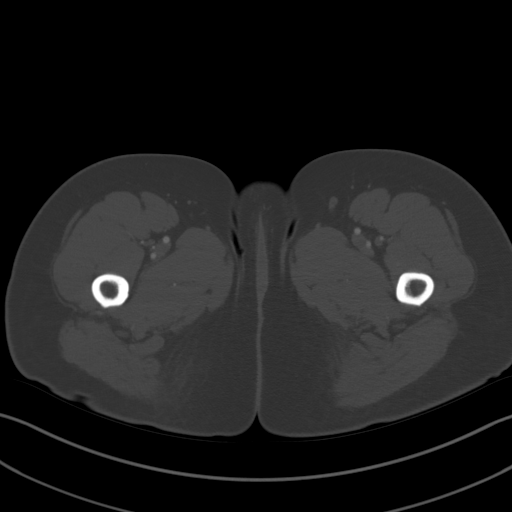
[im 16/90  soft-tissue]
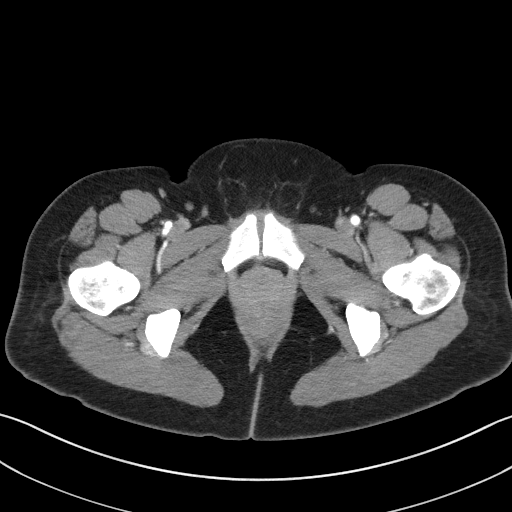
[im 21/90  soft-tissue]
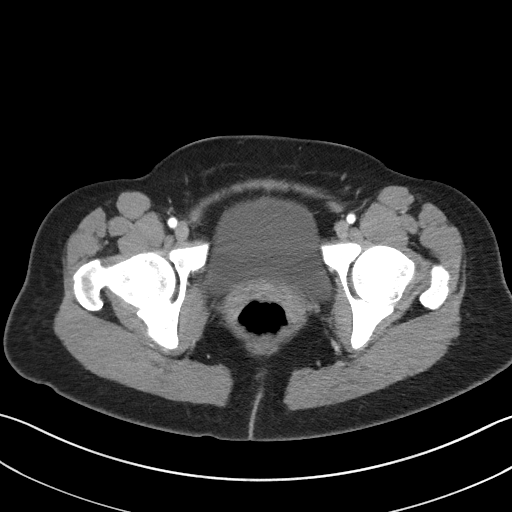
[im 27/90  soft-tissue]
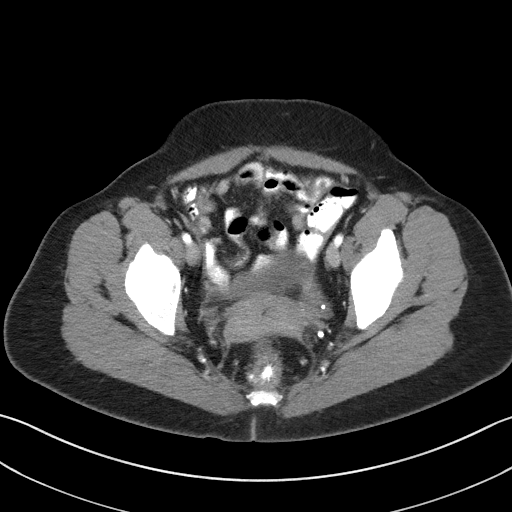
[im 37/90  soft-tissue]
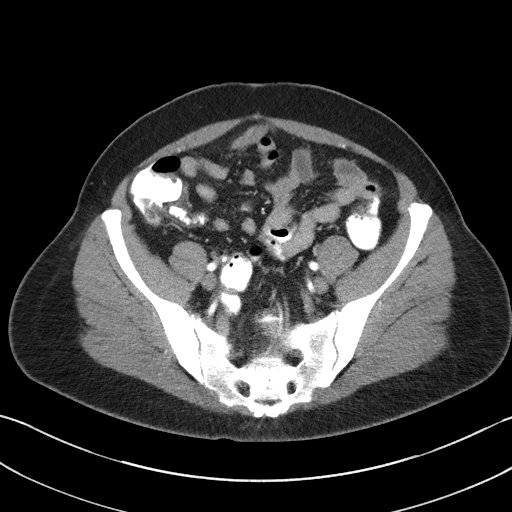
[im 42/90  soft-tissue]
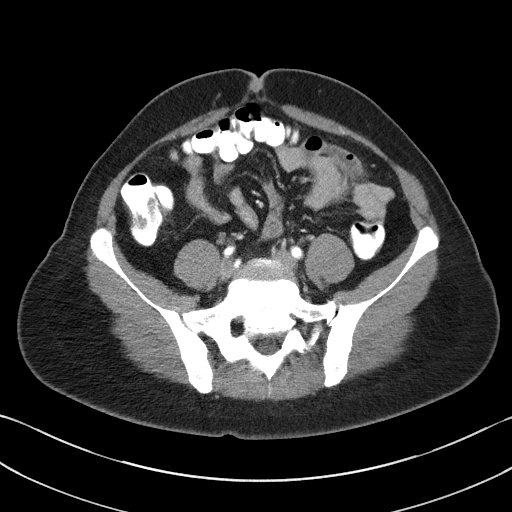
[im 48/90  soft-tissue]
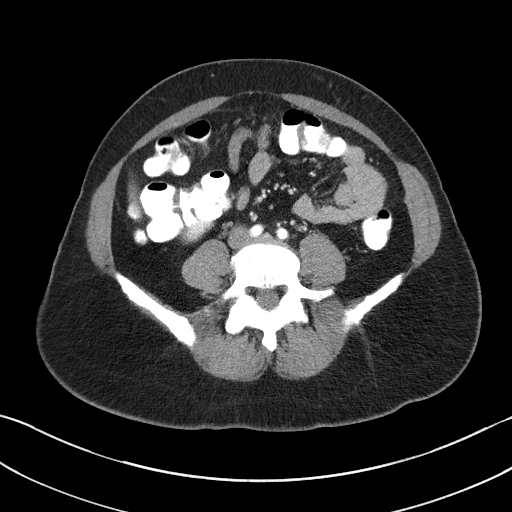
[im 58/90  soft-tissue]
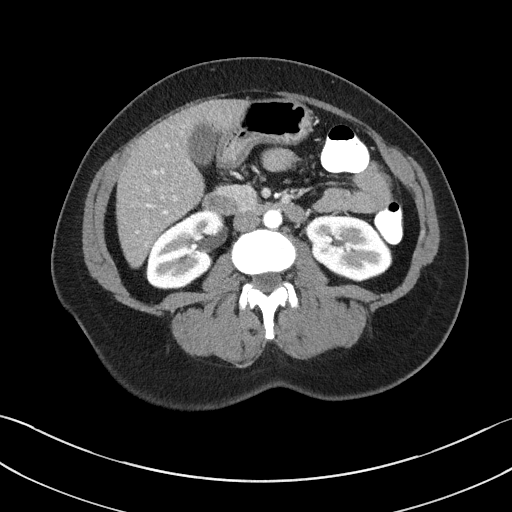
[im 63/90  soft-tissue]
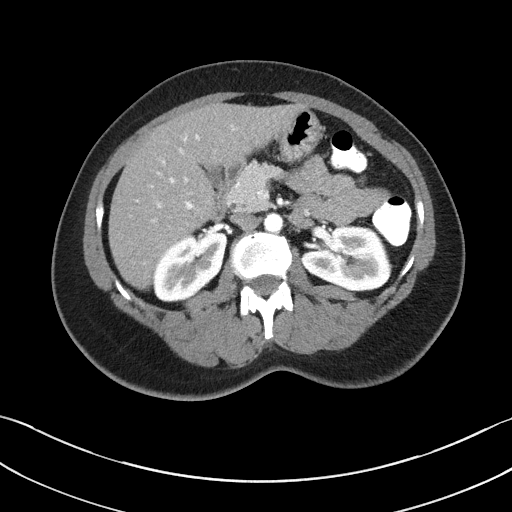
[im 63/90  bone]
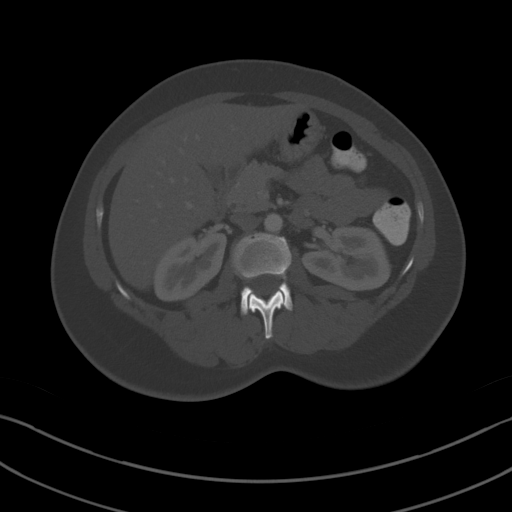
[im 69/90  soft-tissue]
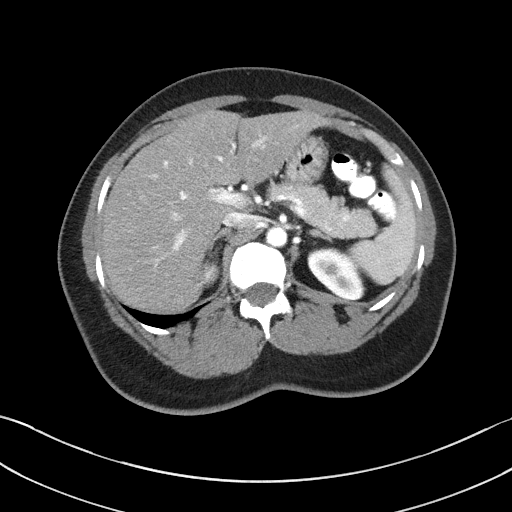
[im 79/90  soft-tissue]
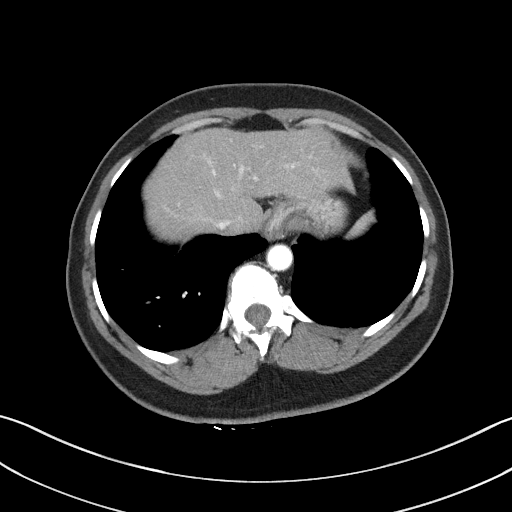
[im 84/90  soft-tissue]
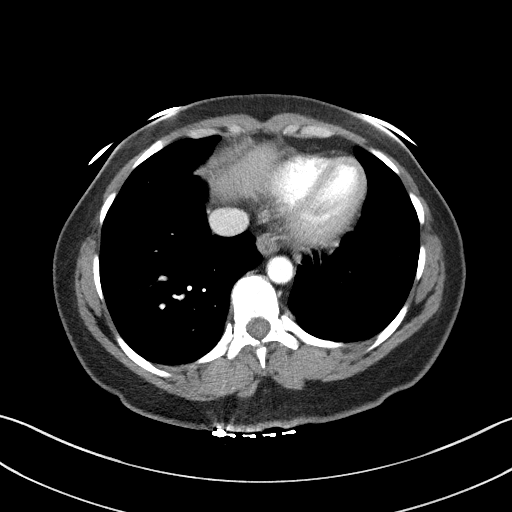

[Series 5: coronal st · coronal · 0.72mm/px · 3 of 96 slices shown]
[im 32/96  soft-tissue]
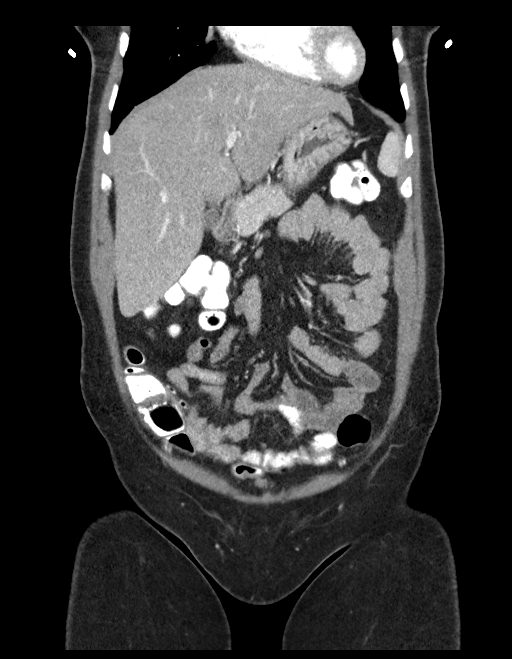
[im 43/96  soft-tissue]
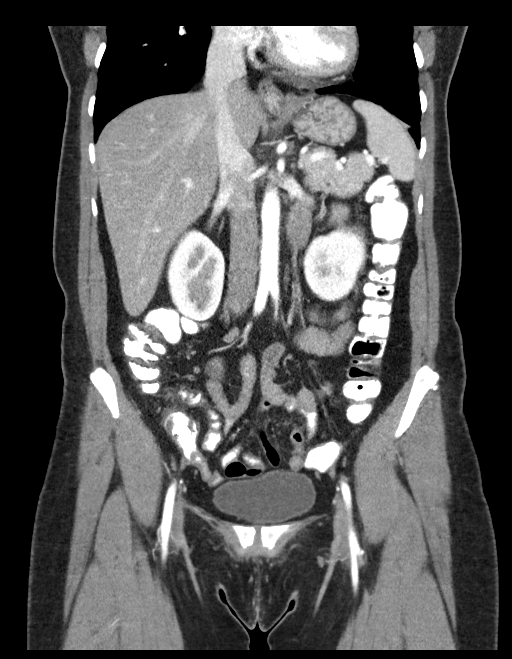
[im 53/96  soft-tissue]
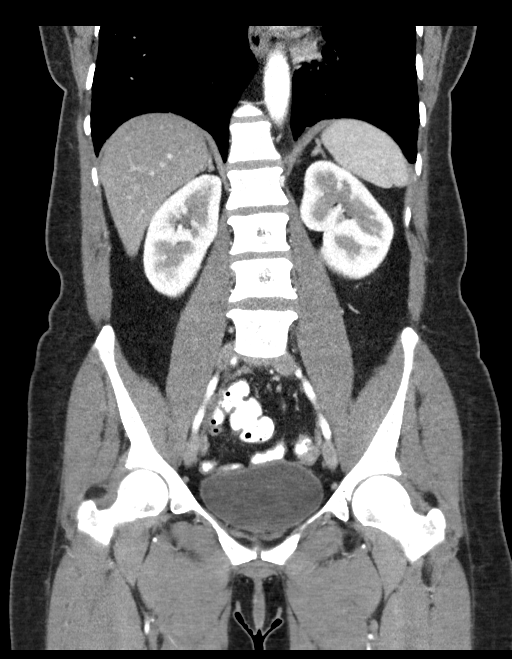

[15 of 46 positions shown; findings below may reference images not displayed]

FINDINGS: Lower chest: Bronchiectasis noted left base with hyperexpansion of
lung parenchyma, stable. 6 mm perifissural nodule seen in the left
lower lobe on image 1/series 4 is stable since chest CT of
[DATE] consistent with benign etiology.

Hepatobiliary: No suspicious focal abnormality within the liver
parenchyma. There is no evidence for gallstones, gallbladder wall
thickening, or pericholecystic fluid. No intrahepatic or
extrahepatic biliary dilation.

Pancreas: No focal mass lesion. No dilatation of the main duct. No
intraparenchymal cyst. No peripancreatic edema.

Spleen: No splenomegaly. No focal mass lesion.

Adrenals/Urinary Tract: No adrenal nodule or mass. 7 mm hypodensity
in the upper pole right kidney is stable since prior study, likely
benign cyst. Left kidney unremarkable. No evidence for hydroureter.
The urinary bladder appears normal for the degree of distention.

Stomach/Bowel: Tiny hiatal hernia. Stomach otherwise unremarkable.
Duodenum is normally positioned as is the ligament of Treitz. No
small bowel wall thickening. No small bowel dilatation. The terminal
ileum is normal. The appendix is normal. No gross colonic mass. No
colonic wall thickening.

Vascular/Lymphatic: No abdominal aortic aneurysm. No abdominal
aortic atherosclerotic calcification. There is no gastrohepatic or
hepatoduodenal ligament lymphadenopathy. No retroperitoneal or
mesenteric lymphadenopathy. No pelvic sidewall lymphadenopathy.

Reproductive: The uterus is surgically absent. There is no adnexal
mass.

Other: Trace free fluid noted in the left adnexal space.

Musculoskeletal: No worrisome lytic or sclerotic osseous
abnormality.
IMPRESSION: 1. No acute findings in the abdomen or pelvis. Specifically, no
findings to explain the patient's history of abdominal pain.
2. Left lower lobe bronchiectasis with hyperexpansion and 6 mm
perifissural nodule, findings stable since chest CT of [DATE].
Interval stability of the nodule is consistent with benign etiology.
3. Tiny hiatal hernia.

## 2019-01-03 MED ORDER — IOHEXOL 300 MG/ML  SOLN
100.0000 mL | Freq: Once | INTRAMUSCULAR | Status: AC | PRN
  Administered 2019-01-03: 100 mL via INTRAVENOUS

## 2019-01-05 ENCOUNTER — Ambulatory Visit (HOSPITAL_COMMUNITY)
Admission: RE | Admit: 2019-01-05 | Discharge: 2019-01-05 | Disposition: A | Discharge status: Home / Self Care | Payer: Medicare HMO | Source: Ambulatory Visit | Attending: Physician Assistant | Admitting: Physician Assistant

## 2019-01-05 DIAGNOSIS — M542 Cervicalgia: Secondary | ICD-10-CM | POA: Diagnosis not present

## 2019-01-05 IMAGING — MR MR CERVICAL SPINE W/O CM
4 of 5 series · 19 of 48 positions shown · non-contrast
Comparison: None.

CLINICAL DATA: 48-year-old female with neck pain radiating to the
right arm. Right arm weakness and left leg numbness.

EXAM:
MRI CERVICAL SPINE WITHOUT CONTRAST
TECHNIQUE: Multiplanar, multisequence MR imaging of the cervical spine was
performed. No intravenous contrast was administered.

[Series 2: T2 · sagittal · 3.0mm · 0.43mm/px · 4 of 15 slices shown (1 of 2)]
[im 1/15]
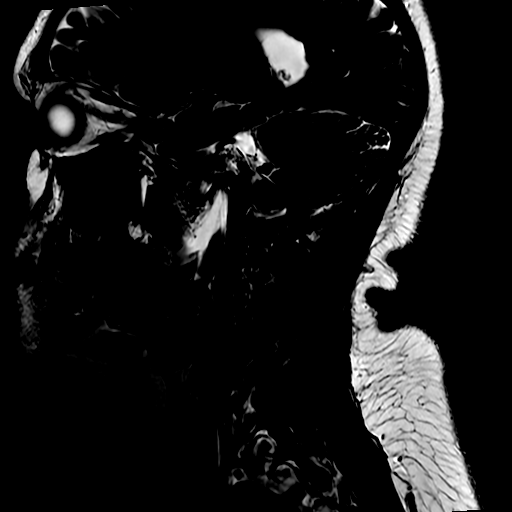
[im 5/15]
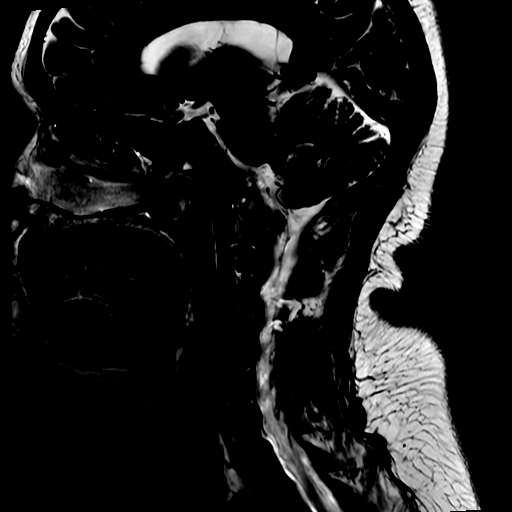
[im 10/15]
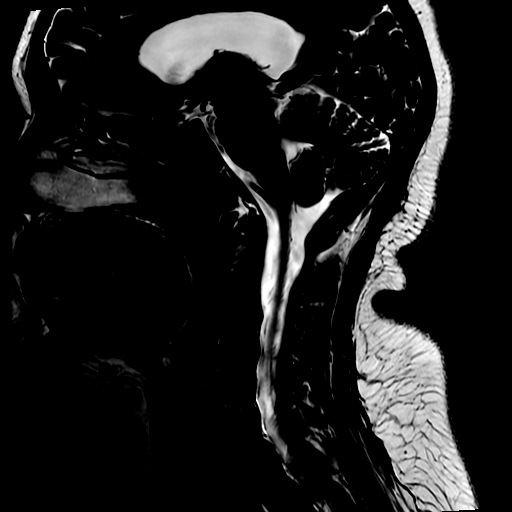
[im 15/15]
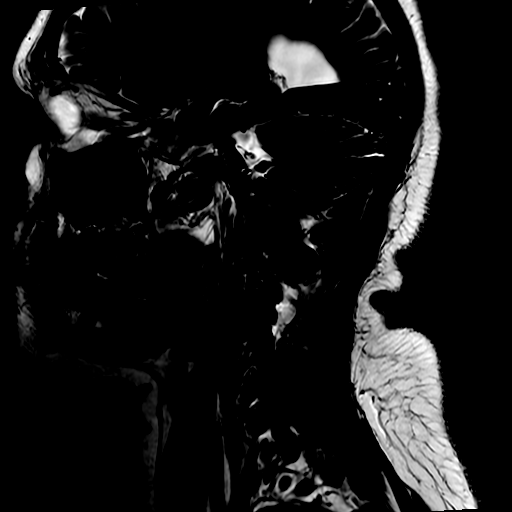

[Series 3: FLAIR · sagittal · 3.0mm · 0.43mm/px · 3 of 15 slices shown]
[im 1/15]
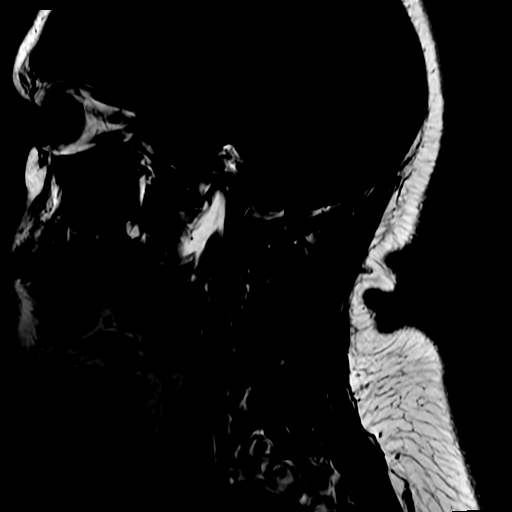
[im 8/15]
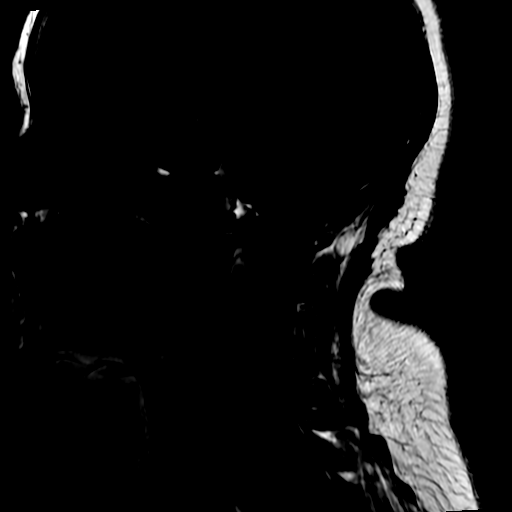
[im 15/15]
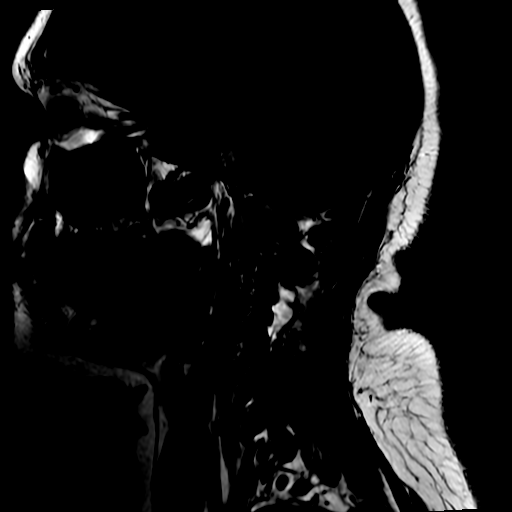

[Series 4: STIR · sagittal · 3.0mm · 0.43mm/px · 3 of 15 slices shown]
[im 1/15]
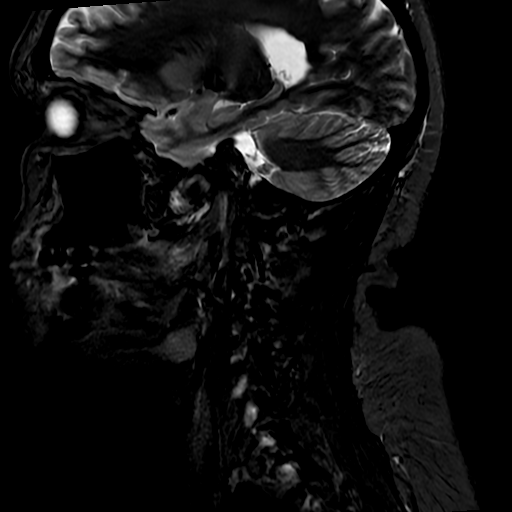
[im 8/15]
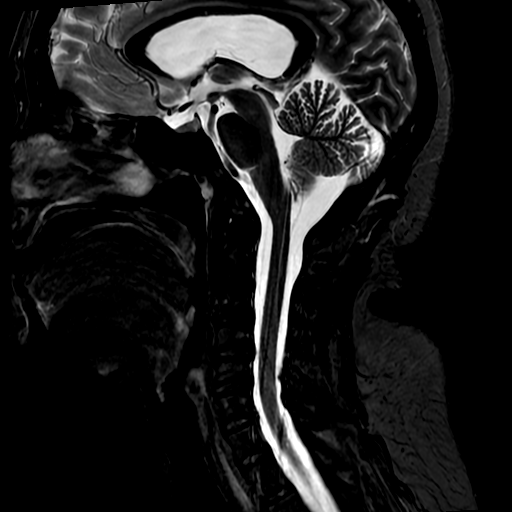
[im 15/15]
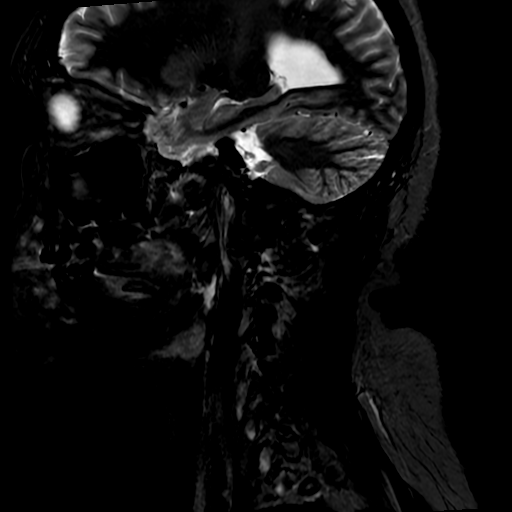

[Series 6: T2 · axial · 3.0mm · 0.35mm/px · z∈[-125,-30]mm · 9 of 29 slices shown (2 of 2)]
[im 1/29]
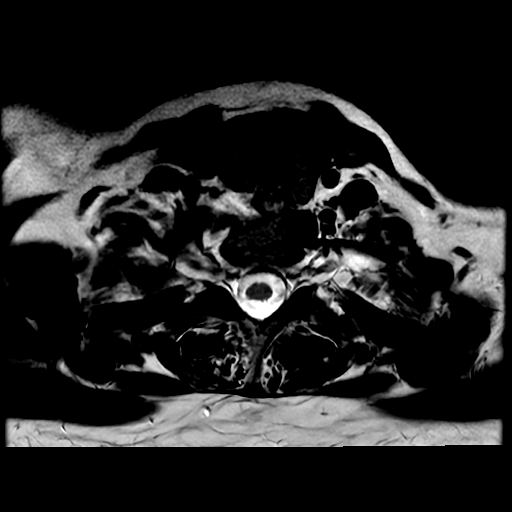
[im 4/29]
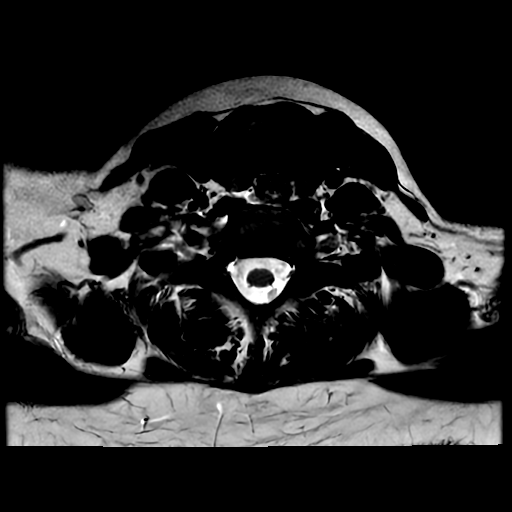
[im 8/29]
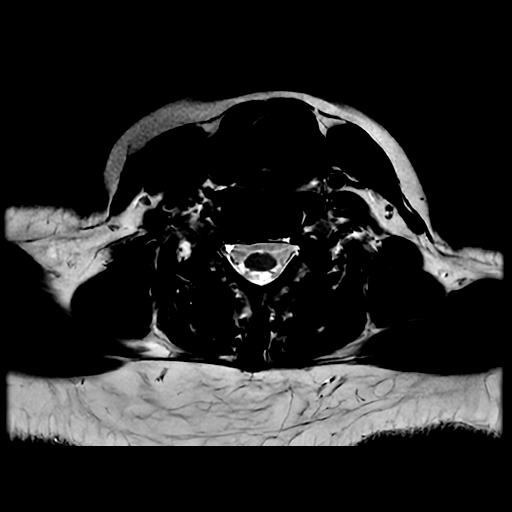
[im 11/29]
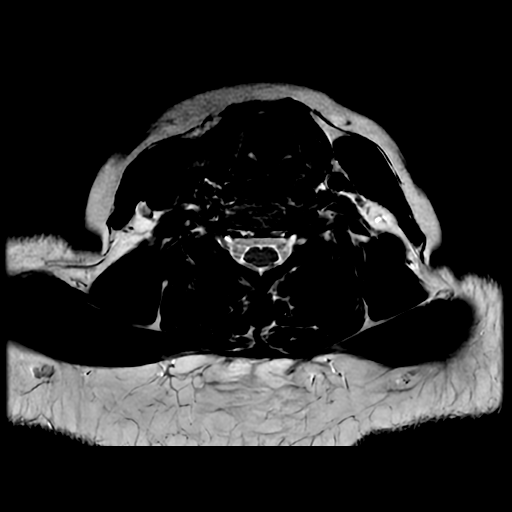
[im 15/29]
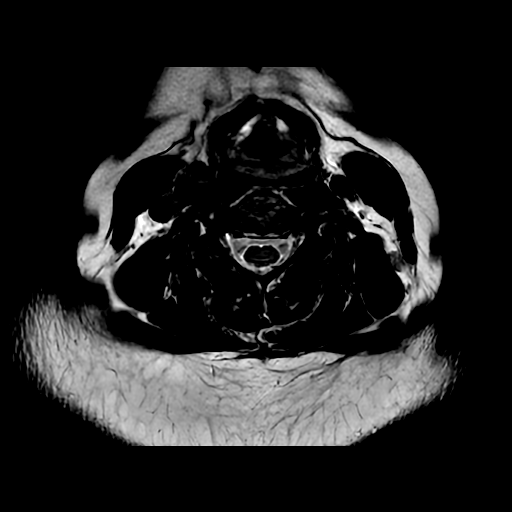
[im 18/29]
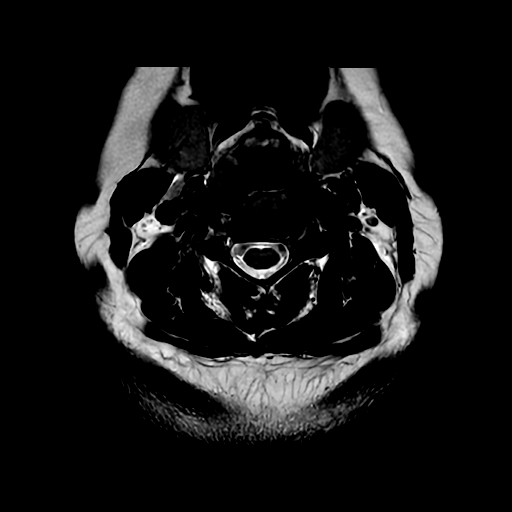
[im 22/29]
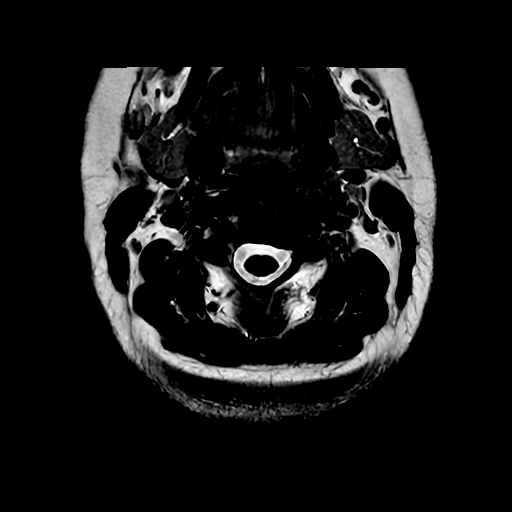
[im 25/29]
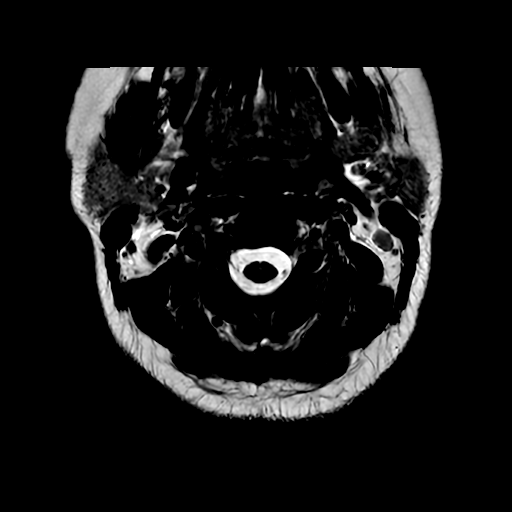
[im 29/29]
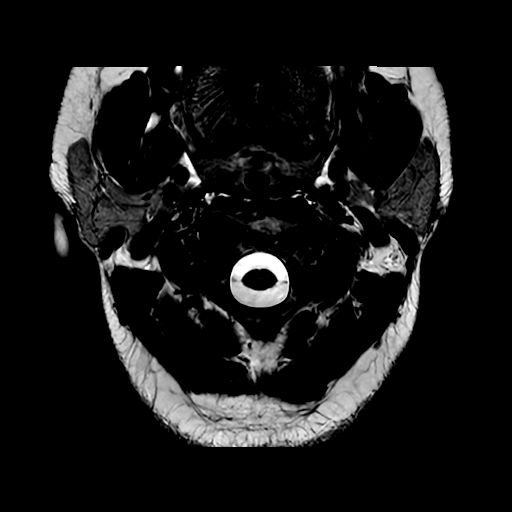

[19 of 48 positions shown; findings below may reference images not displayed]

FINDINGS: Alignment: Preserved lower cervical lordosis. Mild straightening of
the upper cervical spine. No spondylolisthesis.

Vertebrae: No marrow edema or evidence of acute osseous abnormality.
Background bone marrow signal within normal limits.

Cord: Spinal cord signal is within normal limits at all visualized
levels. Fairly capacious spinal canal.

Posterior Fossa, vertebral arteries, paraspinal tissues:
Cervicomedullary junction is within normal limits. Visible brain
parenchyma suggesting nonspecific lateral ventriculomegaly. No
definite signal abnormality in the visible brain. There may be
superimposed generalized cerebral volume loss, uncertain.

Preserved major vascular flow voids in the neck. The vertebral
artery V2 segments are tortuous. Negative visible other neck and
face soft tissues.

Disc levels:

C2-C3:  Minor endplate spurring.  No stenosis.

C3-C4: Mild leftward disc bulge and endplate spurring. Borderline to
mild left C4 foraminal stenosis.

C4-C5: Mild circumferential disc bulge with endplate spurring. No
convincing stenosis.

C5-C6:  Negative.

C6-C7:  Negative.

C7-T1:  Mild facet hypertrophy.  No stenosis.

Negative visible upper thoracic levels.
IMPRESSION: 1. Capacious cervical spinal canal with mild for age cervical disc
bulging and endplate spurring. No spinal stenosis or convincing
neural impingement.

2. Visible brain with suggestion of ventriculomegaly, and possibly
also age advanced cerebral volume loss. Follow-up Head CT or Brain
MRI may be valuable (noncontrast should suffice).

## 2019-01-07 ENCOUNTER — Telehealth: Payer: Self-pay

## 2019-01-07 NOTE — Telephone Encounter (Signed)
-----   Message from Lavonia Drafts, MD sent at 01/07/2019 10:26 AM EST ----- Please call pt. Her CT of the Abd/pelvis was WNL.   Thx,  Clh-S

## 2019-01-07 NOTE — Telephone Encounter (Signed)
Called pt regarding results. Pt made aware that her CT scan of the Abd/pelvis was within normal limits. Understanding was voiced.  Hannah Barron l Hannah Barron, CMA

## 2019-01-15 ENCOUNTER — Ambulatory Visit: Payer: Medicare HMO | Admitting: Physical Therapy

## 2019-01-17 ENCOUNTER — Encounter: Payer: Medicare HMO | Admitting: Physical Therapy

## 2019-01-17 ENCOUNTER — Ambulatory Visit: Payer: Medicare HMO | Admitting: Physical Therapy

## 2019-01-17 DIAGNOSIS — Z87898 Personal history of other specified conditions: Secondary | ICD-10-CM | POA: Diagnosis not present

## 2019-01-17 DIAGNOSIS — Z6825 Body mass index (BMI) 25.0-25.9, adult: Secondary | ICD-10-CM | POA: Diagnosis not present

## 2019-01-22 ENCOUNTER — Other Ambulatory Visit: Payer: Self-pay

## 2019-01-22 ENCOUNTER — Ambulatory Visit: Payer: Medicare HMO | Attending: Physician Assistant | Admitting: Physical Therapy

## 2019-01-22 DIAGNOSIS — M5412 Radiculopathy, cervical region: Secondary | ICD-10-CM

## 2019-01-22 DIAGNOSIS — R293 Abnormal posture: Secondary | ICD-10-CM | POA: Insufficient documentation

## 2019-01-22 NOTE — Therapy (Signed)
[place], [place], [address] Phone: [phone]   Fax:  [phone]  Physical Therapy Treatment/Discharge  Patient Details  Name: Hannah Barron MRN: [id number] Date of Birth: Nov 03, 1970 Referring Provider (PT): Ferne Reus [place]   Encounter Date: 01/22/2019  PT End of Session - 01/22/19 0927    Visit Number  --    PT Start Time  0927    PT Stop Time  0937    PT Time Calculation (min)  10 min       Past Medical History:  Diagnosis Date  . Anemia   . Bronchiectasis ([place])   . COPD (chronic obstructive pulmonary disease) ([place])   . GERD (gastroesophageal reflux disease)   . Migraines   . Paroxysmal SVT (supraventricular tachycardia) ([place])    a. diagnosed in 11/2015.  . Right leg numbness     Past Surgical History:  Procedure Laterality Date  . CESAREAN SECTION    . CYSTOSCOPY N/A 04/05/2016   Procedure: CYSTOSCOPY;  Surgeon: Lavonia Drafts, MD;  Location: [facility];  Service: Gynecology;  Laterality: N/A;  . LAPAROSCOPIC VAGINAL HYSTERECTOMY WITH SALPINGECTOMY Bilateral 04/05/2016   Procedure: LAPAROSCOPIC ASSISTED VAGINAL HYSTERECTOMY WITH SALPINGECTOMY;  Surgeon: Lavonia Drafts, MD;  Location: [facility];  Service: Gynecology;  Laterality: Bilateral;  . LUNG BIOPSY    . VIDEO BRONCHOSCOPY Bilateral 03/29/2016   Procedure: VIDEO BRONCHOSCOPY WITHOUT FLUORO;  Surgeon: Marshell Garfinkel, MD;  [facility];  Service: Cardiopulmonary;  Laterality: Bilateral;    There were no vitals filed for this visit.      [facility] PT Assessment - 01/22/19 0001      Assessment   Medical Diagnosis  Cervical pain    Referring Provider (PT)  Ferne Reus PA      Strength   Right Shoulder Flexion  4/5    Right Shoulder ABduction  4-/5    Right Shoulder Internal Rotation  4-/5    Right Shoulder External Rotation  4-/5    Left Shoulder Flexion  5/5    Left Shoulder ABduction  5/5    Left Shoulder Internal Rotation  5/5    Left Shoulder External Rotation  5/5                   [facility] Adult PT Treatment/Exercise - 01/22/19 0001      Neck Exercises: Machines for Strengthening   UBE (Upper Arm Bike)  L 4 3 fwd/3 back               PT Short Term Goals - 11/15/18 1015      PT SHORT TERM GOAL #1   Title  Pt will be independent with her initial HEP within 1 week.    Status  Achieved      PT SHORT TERM GOAL #2   Title  Pt will be able to pace her breathe during activities to decrease accessory muscle use and decrease need for rest breaks to catch her breathe during exercise as demonstrated by no episodes of holding the breathe or requested rest breaks within 2 weeks.    Status  Achieved        PT Long Term Goals - 01/22/19 0936      PT LONG TERM GOAL #1   Title  Pt will report pain 4/10 within 4 weeks in her R cervical spine and shoulder in order to improve quality of life.    Status  Achieved  PT LONG TERM GOAL #2   Title  Pt will improve cervical ROM to 45 degrees R/L side bending within 4 weeks in order to improve functional ROM and demonstrate improve muscle extensibility    Status  Achieved      PT LONG TERM GOAL #3   Title  Pt will improve Bil shoulder strength to 4+/5 globally within 4 weeks to demonstrate improved functional strength.    Status  Partially Met            Plan - 01/22/19 0933    Clinical Impression Statement  Pt presented for a visit today.  She reports that she continues to do very well and is performing her HEP.  Her episode of care was closed out at last visit.  She does not feel like she needs anymore therapy.  We will discharge today to HEP. Only defecit was slight weakness in her shoulders and this will improve .    PT Next Visit Plan  none       Patient will benefit from skilled therapeutic intervention in order to improve the following deficits and impairments:     Visit Diagnosis: Abnormal  posture  Right cervical radiculopathy     Problem List Patient Active Problem List   Diagnosis Date Noted  . Vitamin D deficiency 10/01/2018  . HLD (hyperlipidemia) 10/01/2018  . Hyperglycemia 10/01/2018  . Right cervical radiculopathy 10/01/2018  . Trapezius muscle strain, right, initial encounter 10/01/2018  . Right carpal tunnel syndrome 10/01/2018  . Cerumen impaction 09/07/2017  . Acne vulgaris 12/28/2016  . Bronchiectasis with acute exacerbation ([place]) 12/22/2016  . Medicare annual wellness visit, subsequent 04/28/2016  . Encounter for general adult medical examination with abnormal findings 04/28/2016  . Lung nodules 03/16/2016  . Palpitations   . GERD (gastroesophageal reflux disease) 07/30/2015  . Bronchiectasis without acute exacerbation ([place]) 07/08/2015  . COPD (chronic obstructive pulmonary disease) ([place]) 06/04/2015  . Fatigue 06/04/2015    Jeral Pinch PT  01/22/2019, 9:39 AM  [place], [place], [address] Phone: [phone]   Fax:  [phone]  Name: Malie Kashani MRN: [id number] Date of Birth: 01-06-1971  PHYSICAL THERAPY DISCHARGE SUMMARY  Visits from Start of Care: 10  Current functional level related to goals / functional outcomes: See above, patient pleased with her progress and reported 100% improvement at last visit   Remaining deficits: Slight UE weakness, this will improve with HEP    Education / Equipment: HEP Plan: Patient agrees to discharge.  Patient goals were partially met. Patient is being discharged due to being pleased with the current functional level.  ?????    Jeral Pinch, PT 01/22/19 9:43 AM

## 2019-01-23 ENCOUNTER — Encounter: Payer: Self-pay | Admitting: Obstetrics & Gynecology

## 2019-01-23 ENCOUNTER — Ambulatory Visit (INDEPENDENT_AMBULATORY_CARE_PROVIDER_SITE_OTHER): Payer: Medicare HMO | Admitting: Obstetrics & Gynecology

## 2019-01-23 VITALS — BP 102/65 | HR 79 | Ht 67.0 in | Wt 166.1 lb

## 2019-01-23 DIAGNOSIS — R102 Pelvic and perineal pain: Secondary | ICD-10-CM | POA: Diagnosis not present

## 2019-01-23 NOTE — Progress Notes (Signed)
History:  49 y.o. G1P1001 here today for f/u of pelvic pain. Pt reports that the sx have resolved. She has not been sexually acitve ince she was last seen as her husband has traveled to [place] for the next 2 months. Pt reports less stress since last visit as she moved to another location to avoid her husbands son who is mentally ill.  She denies bleeding or abnormal vaginal discharge.   The following portions of the patient's history were reviewed and updated as appropriate: allergies, current medications, past family history, past medical history, past social history, past surgical history and problem list.  Review of Systems:  Pertinent items are noted in HPI.    Objective:  Physical Exam Blood pressure 102/65, pulse 79, height 5\' 7"  (1.702 m), weight 166 lb 1.9 oz (75.4 kg), last menstrual period 03/31/2016.  CONSTITUTIONAL: Well-developed, well-nourished female in no acute distress.  HENT:  Normocephalic, atraumatic EYES: Conjunctivae and EOM are normal. No scleral icterus.  NECK: Normal range of motion SKIN: Skin is warm and dry. No rash noted. Not diaphoretic.No pallor. [place]: Alert and oriented to person, place, and time. Normal coordination.  Abd: Soft, nontender and nondistended Pelvic: deferred  Labs and Imaging DG Lumbar Spine 2-3 Views  Result Date: 12/31/2018 CLINICAL DATA:  Chronic low back pain. No known injury. EXAM: LUMBAR SPINE - 2-3 VIEW COMPARISON:  Abdomen and pelvis CT dated 06/15/2017. FINDINGS: Five non-rib-bearing lumbar vertebrae. These have normal appearances with no fractures, pars defects or subluxations. IMPRESSION: Normal examination. Electronically Signed   By: Claudie Revering M.D.   On: 12/31/2018 22:19   MR CERVICAL SPINE WO CONTRAST  Result Date: 01/05/2019 CLINICAL DATA:  49 year old female with neck pain radiating to the right arm. Right arm weakness and left leg numbness. EXAM: MRI CERVICAL SPINE WITHOUT CONTRAST TECHNIQUE: Multiplanar,  multisequence MR imaging of the cervical spine was performed. No intravenous contrast was administered. COMPARISON:  None. FINDINGS: Alignment: Preserved lower cervical lordosis. Mild straightening of the upper cervical spine. No spondylolisthesis. Vertebrae: No marrow edema or evidence of acute osseous abnormality. Background bone marrow signal within normal limits. Cord: Spinal cord signal is within normal limits at all visualized levels. Fairly capacious spinal canal. Posterior Fossa, vertebral arteries, paraspinal tissues: Cervicomedullary junction is within normal limits. Visible brain parenchyma suggesting nonspecific lateral ventriculomegaly. No definite signal abnormality in the visible brain. There may be superimposed generalized cerebral volume loss, uncertain. Preserved major vascular flow voids in the neck. The vertebral artery V2 segments are tortuous. Negative visible other neck and face soft tissues. Disc levels: C2-C3:  Minor endplate spurring.  No stenosis. C3-C4: Mild leftward disc bulge and endplate spurring. Borderline to mild left C4 foraminal stenosis. C4-C5: Mild circumferential disc bulge with endplate spurring. No convincing stenosis. C5-C6:  Negative. C6-C7:  Negative. C7-T1:  Mild facet hypertrophy.  No stenosis. Negative visible upper thoracic levels. IMPRESSION: 1. Capacious cervical spinal canal with mild for age cervical disc bulging and endplate spurring. No spinal stenosis or convincing neural impingement. 2. Visible brain with suggestion of ventriculomegaly, and possibly also age advanced cerebral volume loss. Follow-up Head CT or Brain MRI may be valuable (noncontrast should suffice). Electronically Signed   By: Genevie Ann M.D.   On: 01/05/2019 13:17   CT ABDOMEN PELVIS W CONTRAST  Result Date: 01/03/2019 CLINICAL DATA:  Generalized abdominal pain. Right lower abdominal pain radiating to the left side for 3-4 weeks. EXAM: CT ABDOMEN AND PELVIS WITH CONTRAST TECHNIQUE:  Multidetector CT imaging of the  abdomen and pelvis was performed using the standard protocol following bolus administration of intravenous contrast. CONTRAST:  169mL OMNIPAQUE IOHEXOL 300 MG/ML  SOLN COMPARISON:  Abdomen/pelvis CT 06/15/2017.  Chest CT 03/21/2016. FINDINGS: Lower chest: Bronchiectasis noted left base with hyperexpansion of lung parenchyma, stable. 6 mm perifissural nodule seen in the left lower lobe on image 1/series 4 is stable since chest CT of 03/21/2016 consistent with benign etiology. Hepatobiliary: No suspicious focal abnormality within the liver parenchyma. There is no evidence for gallstones, gallbladder wall thickening, or pericholecystic fluid. No intrahepatic or extrahepatic biliary dilation. Pancreas: No focal mass lesion. No dilatation of the main duct. No intraparenchymal cyst. No peripancreatic edema. Spleen: No splenomegaly. No focal mass lesion. Adrenals/Urinary Tract: No adrenal nodule or mass. 7 mm hypodensity in the upper pole right kidney is stable since prior study, likely benign cyst. Left kidney unremarkable. No evidence for hydroureter. The urinary bladder appears normal for the degree of distention. Stomach/Bowel: Tiny hiatal hernia. Stomach otherwise unremarkable. Duodenum is normally positioned as is the ligament of Treitz. No small bowel wall thickening. No small bowel dilatation. The terminal ileum is normal. The appendix is normal. No gross colonic mass. No colonic wall thickening. Vascular/Lymphatic: No abdominal aortic aneurysm. No abdominal aortic atherosclerotic calcification. There is no gastrohepatic or hepatoduodenal ligament lymphadenopathy. No retroperitoneal or mesenteric lymphadenopathy. No pelvic sidewall lymphadenopathy. Reproductive: The uterus is surgically absent. There is no adnexal mass. Other: Trace free fluid noted in the left adnexal space. Musculoskeletal: No worrisome lytic or sclerotic osseous abnormality. IMPRESSION: 1. No acute findings in the  abdomen or pelvis. Specifically, no findings to explain the patient's history of abdominal pain. 2. Left lower lobe bronchiectasis with hyperexpansion and 6 mm perifissural nodule, findings stable since chest CT of 03/21/2016. Interval stability of the nodule is consistent with benign etiology. 3. Tiny hiatal hernia. Electronically Signed   By: Misty Stanley M.D.   On: 01/03/2019 19:51   Cardiac event monitor  Result Date: 01/14/2019  Normal sinus rhythm, sinus tachycardia  Normal event monitor. symptoms do not correlate with arrhythmia.     Assessment & Plan:  Abd/pelvic pain: resolved  Reviewed pelvic CT  F/u prn  Viewed stress relief.   Total face-to-face time with patient was 15 min.  Greater than 50% was spent in counseling and coordination of care with the patient.   Asya Derryberry L. Harraway-Smith, M.D., [place]

## 2019-01-25 ENCOUNTER — Other Ambulatory Visit: Payer: Self-pay | Admitting: Physician Assistant

## 2019-01-25 DIAGNOSIS — Z87898 Personal history of other specified conditions: Secondary | ICD-10-CM

## 2019-01-25 DIAGNOSIS — Z8669 Personal history of other diseases of the nervous system and sense organs: Secondary | ICD-10-CM

## 2019-02-05 ENCOUNTER — Other Ambulatory Visit: Payer: Self-pay

## 2019-02-05 ENCOUNTER — Ambulatory Visit (HOSPITAL_COMMUNITY)
Admission: RE | Admit: 2019-02-05 | Discharge: 2019-02-05 | Disposition: A | Discharge status: Home / Self Care | Payer: Medicare HMO | Source: Ambulatory Visit | Attending: Physician Assistant | Admitting: Physician Assistant

## 2019-02-05 DIAGNOSIS — G930 Cerebral cysts: Secondary | ICD-10-CM | POA: Diagnosis not present

## 2019-02-05 DIAGNOSIS — Z8669 Personal history of other diseases of the nervous system and sense organs: Secondary | ICD-10-CM

## 2019-02-05 DIAGNOSIS — Z87898 Personal history of other specified conditions: Secondary | ICD-10-CM | POA: Insufficient documentation

## 2019-02-05 IMAGING — MR MR HEAD WO/W CM
6 of 12 series · 26 of 48 positions shown · IV contrast (gadavist)
Comparison: None.

CLINICAL DATA: History of intracranial mass.

EXAM:
MRI HEAD WITHOUT AND WITH CONTRAST
TECHNIQUE: Multiplanar, multiecho pulse sequences of the brain and surrounding
structures were obtained without and with intravenous contrast.
CONTRAST:  7.5mL GADAVIST GADOBUTROL 1 MMOL/ML IV SOLN

[Series 2: DWI · axial · 3.0mm · 0.94mm/px · z∈[-93,+51]mm · 9 of 100 slices shown (1 of 2)]
[im 1/100]
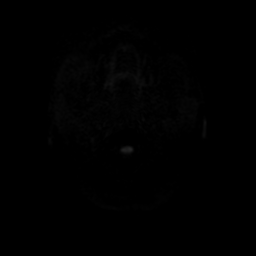
[im 13/100]
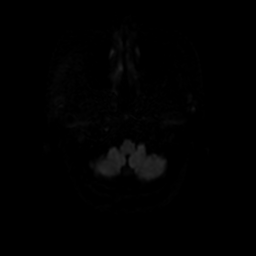
[im 25/100]
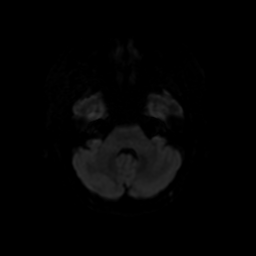
[im 38/100]
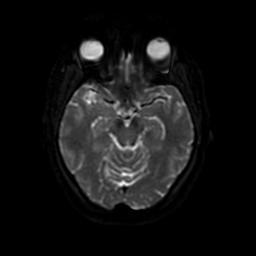
[im 50/100]
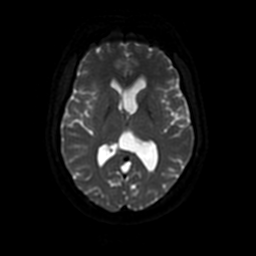
[im 62/100]
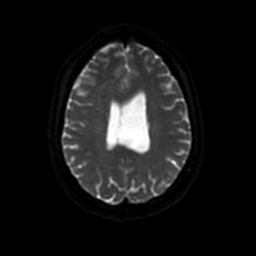
[im 75/100]
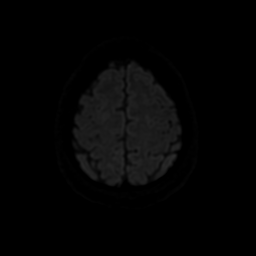
[im 87/100]
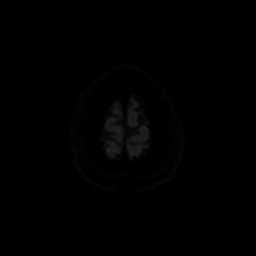
[im 100/100]
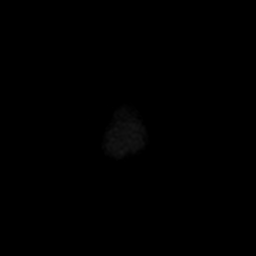

[Series 3: DWI · coronal · 4.0mm · 0.94mm/px · 6 of 74 slices shown (2 of 2)]
[im 1/74]
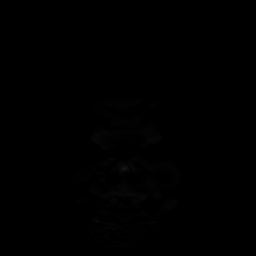
[im 15/74]
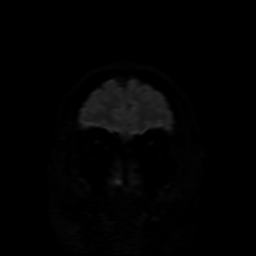
[im 30/74]
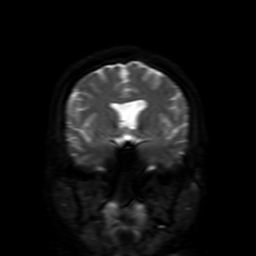
[im 44/74]
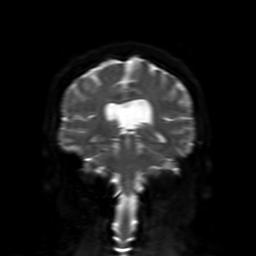
[im 59/74]
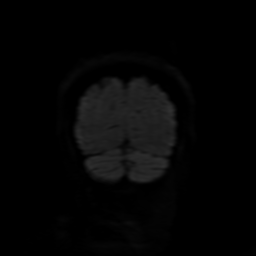
[im 74/74]
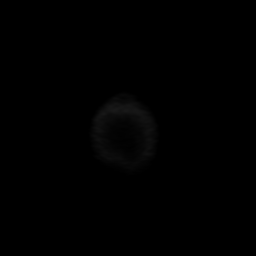

[Series 4: FLAIR · sagittal · 5.0mm · 0.23mm/px · 2 of 26 slices shown (1 of 2)]
[im 1/26]
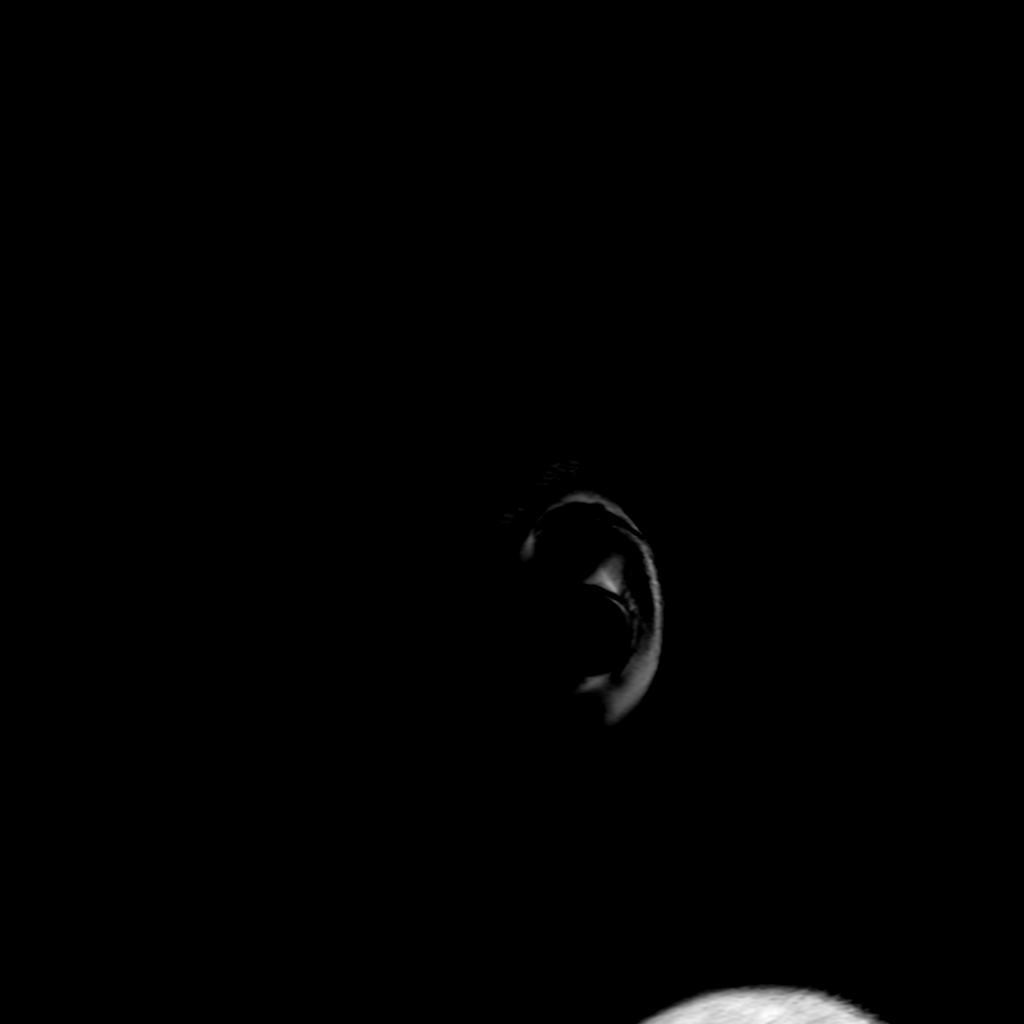
[im 26/26]
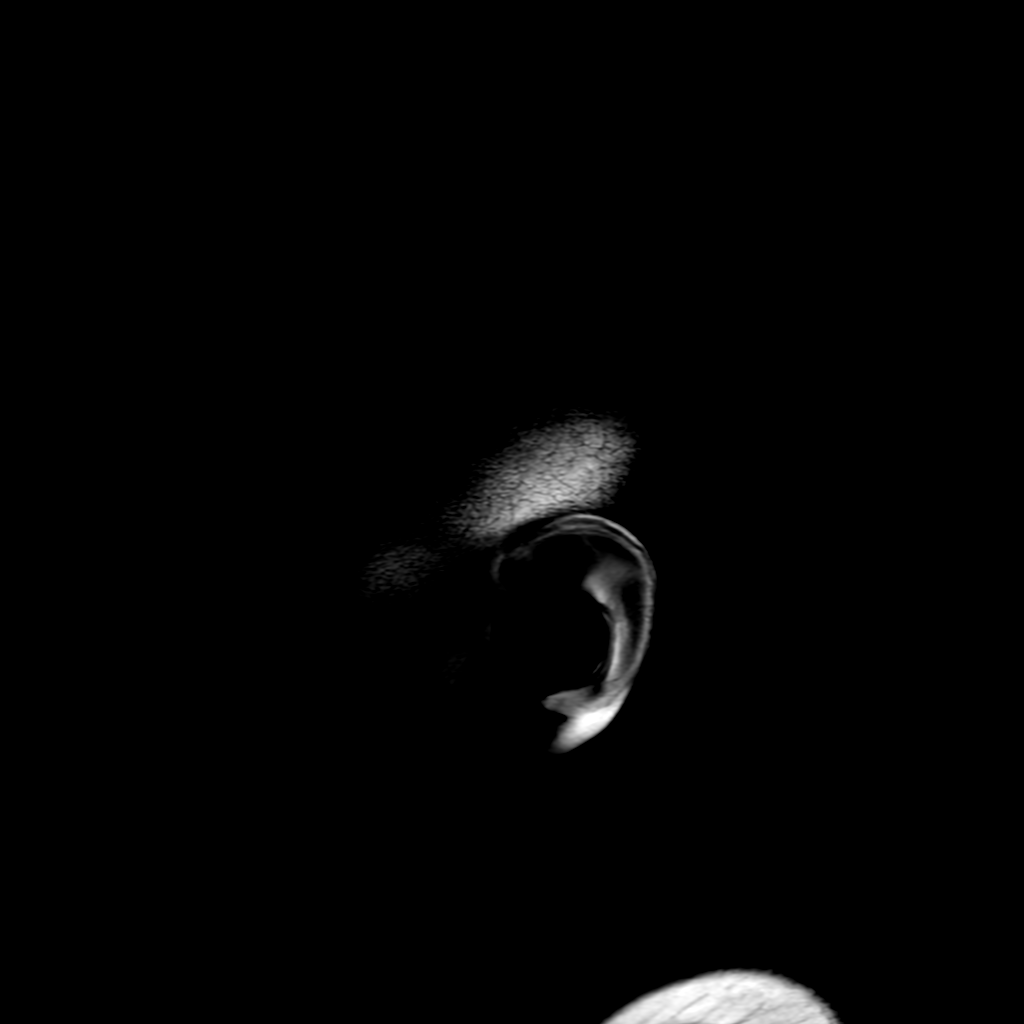

[Series 6: FLAIR · axial · 3.0mm · 0.41mm/px · z∈[-101,+35]mm · 2 of 25 slices shown (2 of 2)]
[im 1/25]
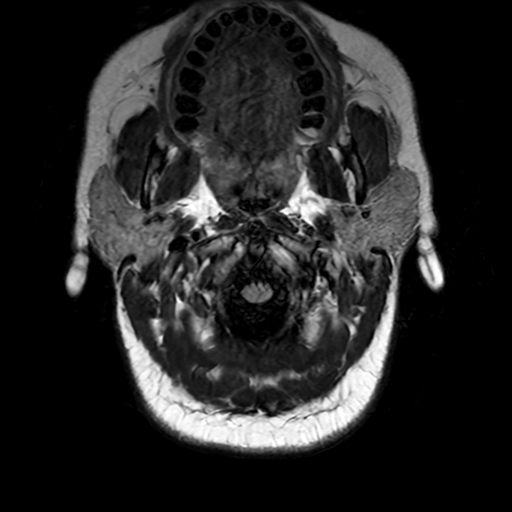
[im 25/25]
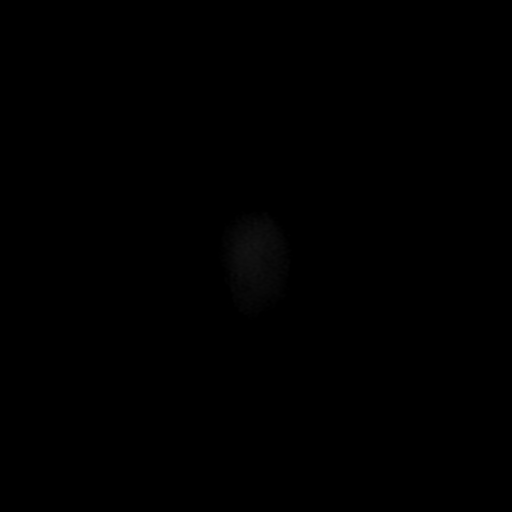

[Series 250: ADC · axial · 3.0mm · 0.94mm/px · z∈[-93,+51]mm · 4 of 50 slices shown (1 of 2)]
[im 1/50]
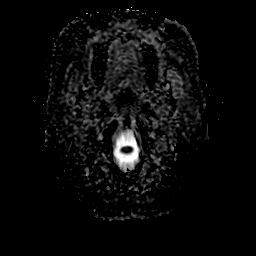
[im 17/50]
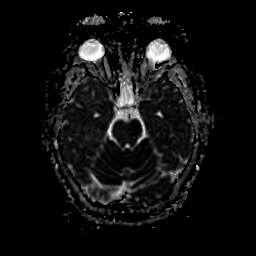
[im 33/50]
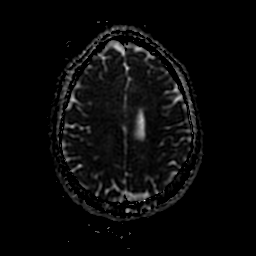
[im 50/50]
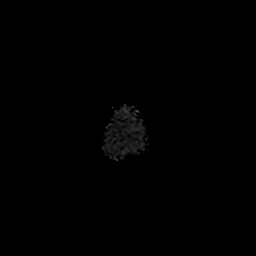

[Series 350: ADC · coronal · 4.0mm · 0.94mm/px · 3 of 37 slices shown (2 of 2)]
[im 1/37]
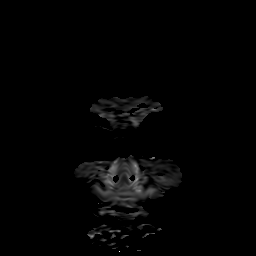
[im 19/37]
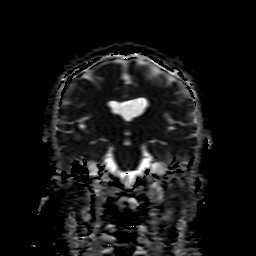
[im 37/37]
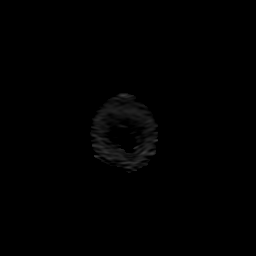

[26 of 48 positions shown; findings below may reference images not displayed]

FINDINGS: Brain: Ventriculomegaly appearance is attributed to asymmetric CSF
intensity dilatation of the left lateral ventricle centered at the
level of the trigone and body with scalloping and rightward
displacement of the septum pellucidum. The horns of the left lateral
ventricle are not dilated to suggest underlying obstruction. There
is no detectable mural enhancement or nodular component to implicate
an underlying inflammatory/infectious process. Generalized cysts are
also not seen. Given the imperceptible walls, accurate and
reproducible measurement of the cyst is not confidently obtained.
Brain itself shows normal signal and volume. No infarct, blood
products, or extra-axial collection.

Vascular: Normal flow voids and vascular enhancements.

Skull and upper cervical spine: Normal marrow signal

Sinuses/Orbits: Negative
IMPRESSION: Ventricular dilatation correlates with a simple intraventricular
cyst in the left lateral ventricle. Outside of the cystic
dilatation, the other ventricles are normal size and there is no
entrapment of the left lateral ventricular horns.

## 2019-02-05 MED ORDER — GADOBUTROL 1 MMOL/ML IV SOLN
7.5000 mL | Freq: Once | INTRAVENOUS | Status: AC | PRN
  Administered 2019-02-05: 7.5 mL via INTRAVENOUS

## 2019-02-14 DIAGNOSIS — Z20828 Contact with and (suspected) exposure to other viral communicable diseases: Secondary | ICD-10-CM | POA: Diagnosis not present

## 2019-02-20 ENCOUNTER — Other Ambulatory Visit: Payer: Self-pay

## 2019-02-20 ENCOUNTER — Ambulatory Visit: Payer: Medicare HMO | Admitting: Physician Assistant

## 2019-02-20 VITALS — BP 107/65 | HR 67 | Temp 97.2°F | Ht 66.0 in | Wt 168.0 lb

## 2019-02-20 DIAGNOSIS — R002 Palpitations: Secondary | ICD-10-CM | POA: Diagnosis not present

## 2019-02-20 DIAGNOSIS — E785 Hyperlipidemia, unspecified: Secondary | ICD-10-CM

## 2019-02-20 NOTE — Progress Notes (Signed)
Cardiology Office Note:    Date:  02/22/2019   ID:  Hannah Barron, DOB [birth year], MRN [id number]  PCP:  Hannah Borg, MD  Cardiologist:  Hannah Martinique, MD  Electrophysiologist:  None   Referring MD: Hannah Borg, MD   Chief Complaint  Patient presents with  . Follow-up    seen for Dr. Martinique.    History of Present Illness:    Hannah Barron is a 49 y.o. female with a hx of COPD, bronchiectasis, migraines, paroxysmal SVT, possible cystic fibrosis, and hyperlipidemia.  Patient was admitted in October 2017 for palpitation and chest pain.  Her troponin was mildly elevated.  EKG showed no acute changes.  Telemetry showed episodes of sinus tachycardia and this was thought to occur along with her symptom.  Echocardiogram at the time showed EF 65 to 70% with no regional wall motion abnormality.  30-day event monitor in November 2017 showed episode of SVT with heart rate of 171.  At the same time, patient had palpitation and dizziness in the setting of SVT.  She was started on metoprolol.  Exercise treadmill stress test performed on 11/30/2015 showed 1 to 2 mm ST depression in the anterior and lateral leads.  Subsequent Lexiscan Myoview obtained on 12/18/2015 showed normal perfusion was less than 1 mm ST depression overall low risk.  I previously saw the patient in August 2018 at which time she complained of waking up early in the morning feeling her heart racing, I added 25 mg nighttime dose of metoprolol to her medical regimen.    Patient was last seen by Hannah Barron on 11/16/2018 for right lower extremity numbness.  Symptom was positional and not exertional.  It was felt her symptom is inconsistent with PAD.  She was taking extra dose of metoprolol for her palpitation.  We recommended a 30-day event monitor and echocardiogram to rule out structural abnormality.  Echocardiogram obtained on 11/27/2018 showed EF 65 to 70%, no significant valve issue.  30-day event monitor obtained in December showed no  significant arrhythmia.  She presents today for cardiology office visit.  Palpitation occurs about once every 2 days, this usually occurs after she is trying to do something.  She denies any chest pain or worsening shortness of breath.  At this time, I do not recommend any further work-up.  Otherwise she has no lower extremity edema, orthopnea or PND.     Past Medical History:  Diagnosis Date  . Anemia   . Bronchiectasis ([place])   . COPD (chronic obstructive pulmonary disease) ([place])   . GERD (gastroesophageal reflux disease)   . Migraines   . Paroxysmal SVT (supraventricular tachycardia) ([place])    a. diagnosed in 11/2015.  . Right leg numbness     Past Surgical History:  Procedure Laterality Date  . CESAREAN SECTION    . CYSTOSCOPY N/A 04/05/2016   Procedure: CYSTOSCOPY;  Surgeon: Lavonia Drafts, MD;  Location: [facility];  Service: Gynecology;  Laterality: N/A;  . LAPAROSCOPIC VAGINAL HYSTERECTOMY WITH SALPINGECTOMY Bilateral 04/05/2016   Procedure: LAPAROSCOPIC ASSISTED VAGINAL HYSTERECTOMY WITH SALPINGECTOMY;  Surgeon: Lavonia Drafts, MD;  Location: [facility];  Service: Gynecology;  Laterality: Bilateral;  . LUNG BIOPSY    . VIDEO BRONCHOSCOPY Bilateral 03/29/2016   Procedure: VIDEO BRONCHOSCOPY WITHOUT FLUORO;  Surgeon: Marshell Garfinkel, MD;  Location: [facility];  Service: Cardiopulmonary;  Laterality: Bilateral;    Current Medications: Current Meds  Medication Sig  . albuterol (PROVENTIL HFA;VENTOLIN HFA) 108 (90 Base) MCG/ACT inhaler INHALE 2 PUFFS INTO  THE LUNGS EVERY 6 HOURS AS NEEDED FOR WHEEZING OR SHORTNESS OF BREATH  . atorvastatin (LIPITOR) 20 MG tablet Take 1 tablet (20 mg total) by mouth daily.  . budesonide-formoterol (SYMBICORT) 160-4.5 MCG/ACT inhaler Inhale 2 puffs into the lungs 2 (two) times daily.  Marland Kitchen CLARAVIS 40 MG capsule Take 2 capsules by mouth daily.  . metoprolol succinate (TOPROL-XL) 50 MG 24 hr tablet TAKE 1 TABLET IN MORNING & 1/2 TABLET IN  EVENING *TAKE 1/2 TAB AS NEEDED FOR PALPITATIONS/RAPID HR*  . omeprazole (PRILOSEC) 40 MG capsule TAKE 1 CAPSULE BY MOUTH EVERY DAY  . Respiratory Therapy Supplies (FLUTTER) DEVI Use as directed.  . RESTASIS 0.05 % ophthalmic emulsion Instill 1 drop into both eyes two times a day     Allergies:   Patient has no known allergies.   Social History   Socioeconomic History  . Marital status: Married    Spouse name: Not on file  . Number of children: 1  . Years of education: 9  . Highest education level: Not on file  Occupational History  . Occupation: disability  Tobacco Use  . Smoking status: Never Smoker  . Smokeless tobacco: Never Used  Substance and Sexual Activity  . Alcohol use: No    Alcohol/week: 0.0 standard drinks  . Drug use: No  . Sexual activity: Yes    Birth control/protection: Surgical  Other Topics Concern  . Not on file  Social History Narrative   Fun: Watch TV   Social Determinants of Health   Financial Resource Strain:   . Difficulty of Paying Living Expenses: Not on file  Food Insecurity:   . Worried About Charity fundraiser in the Last Year: Not on file  . Ran Out of Food in the Last Year: Not on file  Transportation Needs:   . Lack of Transportation (Medical): Not on file  . Lack of Transportation (Non-Medical): Not on file  Physical Activity:   . Days of Exercise per Week: Not on file  . Minutes of Exercise per Session: Not on file  Stress:   . Feeling of Stress : Not on file  Social Connections:   . Frequency of Communication with Friends and Family: Not on file  . Frequency of Social Gatherings with Friends and Family: Not on file  . Attends Religious Services: Not on file  . Active Member of Clubs or Organizations: Not on file  . Attends Archivist Meetings: Not on file  . Marital Status: Not on file     Family History: The patient's family history includes Healthy in her father and mother.  ROS:   Please see the history of  present illness.     All other systems reviewed and are negative.  EKGs/Labs/Other Studies Reviewed:    The following studies were reviewed today:  30 day event monitor 01/14/2019  Normal sinus rhythm, sinus tachycardia  Normal event monitor. symptoms do not correlate with arrhythmia.  EKG:  EKG is ordered today.  The ekg ordered today demonstrates normal sinus rhythm without significant ST-T wave changes  Recent Labs: 10/01/2018: ALT 28; BUN 7; Creatinine, Ser 0.69; Hemoglobin 13.0; Platelets 284.0; Potassium 4.8; Sodium 138; TSH 0.83  Recent Lipid Panel    Component Value Date/Time   CHOL 186 10/01/2018 1053   TRIG 69.0 10/01/2018 1053   HDL 58.90 10/01/2018 1053   CHOLHDL 3 10/01/2018 1053   VLDL 13.8 10/01/2018 1053   LDLCALC 114 (H) 10/01/2018 1053    Physical Exam:  VS:  BP 107/65   Pulse 67   Temp (!) 97.2 F (36.2 C)   Ht 5\' 6"  (1.676 m)   Wt 168 lb (76.2 kg)   LMP 03/31/2016   SpO2 98%   BMI 27.12 kg/m     Wt Readings from Last 3 Encounters:  02/20/19 168 lb (76.2 kg)  01/23/19 166 lb 1.9 oz (75.4 kg)  12/31/18 168 lb (76.2 kg)     GEN:  Well nourished, well developed in no acute distress HEENT: Normal NECK: No JVD; No carotid bruits LYMPHATICS: No lymphadenopathy CARDIAC: RRR, no murmurs, rubs, gallops RESPIRATORY:  Clear to auscultation without rales, wheezing or rhonchi  ABDOMEN: Soft, non-tender, non-distended MUSCULOSKELETAL:  No edema; No deformity  SKIN: Warm and dry NEUROLOGIC:  Alert and oriented x 3 PSYCHIATRIC:  Normal affect   ASSESSMENT:    1. Palpitations   2. Hyperlipidemia LDL goal <100    PLAN:    In order of problems listed above:  1. Palpitation: Recent event monitor was reassuring.  There was no recurrent SVT.  At this time, I do not recommend any further work-up.  She does not drink any caffeinated drinks.  Previous TSH was normal.  2. Hyperlipidemia: Continue Lipitor   Medication Adjustments/Labs and Tests  Ordered: Current medicines are reviewed at length with the patient today.  Concerns regarding medicines are outlined above.  Orders Placed This Encounter  Procedures  . EKG 12-Lead   No orders of the defined types were placed in this encounter.   Patient Instructions  Medication Instructions:  Your physician recommends that you continue on your current medications as directed. Please refer to the Current Medication list given to you today.  *If you need a refill on your cardiac medications before your next appointment, please call your pharmacy*  Lab Work: NONE ordered at this time of appointment   If you have labs (blood work) drawn today and your tests are completely normal, you will receive your results only by: Marland Kitchen MyChart Message (if you have MyChart) OR . A paper copy in the mail If you have any lab test that is abnormal or we need to change your treatment, we will call you to review the results.  Testing/Procedures: NONE ordered at this time of appointment   Follow-Up: At [facility], you and your health needs are our priority.  As part of our continuing mission to provide you with exceptional heart care, we have created designated Provider Care Teams.  These Care Teams include your primary Cardiologist (physician) and Advanced Practice Providers (APPs -  Physician Assistants and Nurse Practitioners) who all work together to provide you with the care you need, when you need it.  Your next appointment:   6 month(s)  The format for your next appointment:   In Person  Provider:   Peter Martinique, MD  Other Instructions     Signed, Almyra Deforest, [place]  02/22/2019 11:54 PM    [place]

## 2019-02-20 NOTE — Patient Instructions (Addendum)

## 2019-02-22 ENCOUNTER — Encounter: Payer: Self-pay | Admitting: Physician Assistant

## 2019-02-27 DIAGNOSIS — G930 Cerebral cysts: Secondary | ICD-10-CM | POA: Diagnosis not present

## 2019-03-08 ENCOUNTER — Other Ambulatory Visit: Payer: Self-pay | Admitting: Cardiology

## 2019-04-02 ENCOUNTER — Ambulatory Visit: Payer: Medicare HMO | Admitting: Internal Medicine

## 2019-04-30 ENCOUNTER — Ambulatory Visit (INDEPENDENT_AMBULATORY_CARE_PROVIDER_SITE_OTHER): Payer: Medicare HMO | Admitting: Internal Medicine

## 2019-04-30 ENCOUNTER — Other Ambulatory Visit: Payer: Self-pay

## 2019-04-30 ENCOUNTER — Encounter: Payer: Self-pay | Admitting: Internal Medicine

## 2019-04-30 VITALS — BP 120/70 | HR 73 | Temp 99.0°F | Ht 66.0 in | Wt 163.0 lb

## 2019-04-30 DIAGNOSIS — R739 Hyperglycemia, unspecified: Secondary | ICD-10-CM

## 2019-04-30 DIAGNOSIS — Z0000 Encounter for general adult medical examination without abnormal findings: Secondary | ICD-10-CM | POA: Diagnosis not present

## 2019-04-30 LAB — CBC WITH DIFFERENTIAL/PLATELET
Basophils Absolute: 0 10*3/uL (ref 0.0–0.1)
Basophils Relative: 0.6 % (ref 0.0–3.0)
Eosinophils Absolute: 0 10*3/uL (ref 0.0–0.7)
Eosinophils Relative: 0.5 % (ref 0.0–5.0)
HCT: 37.2 % (ref 36.0–46.0)
Hemoglobin: 12.4 g/dL (ref 12.0–15.0)
Lymphocytes Relative: 40.1 % (ref 12.0–46.0)
Lymphs Abs: 1.8 10*3/uL (ref 0.7–4.0)
MCHC: 33.3 g/dL (ref 30.0–36.0)
MCV: 98.1 fl (ref 78.0–100.0)
Monocytes Absolute: 0.5 10*3/uL (ref 0.1–1.0)
Monocytes Relative: 10.1 % (ref 3.0–12.0)
Neutro Abs: 2.2 10*3/uL (ref 1.4–7.7)
Neutrophils Relative %: 48.7 % (ref 43.0–77.0)
Platelets: 261 10*3/uL (ref 150.0–400.0)
RBC: 3.8 Mil/uL — ABNORMAL LOW (ref 3.87–5.11)
RDW: 12.9 % (ref 11.5–15.5)
WBC: 4.6 10*3/uL (ref 4.0–10.5)

## 2019-04-30 LAB — URINALYSIS, ROUTINE W REFLEX MICROSCOPIC
Bilirubin Urine: NEGATIVE
Hgb urine dipstick: NEGATIVE
Ketones, ur: NEGATIVE
Leukocytes,Ua: NEGATIVE
Nitrite: NEGATIVE
RBC / HPF: NONE SEEN (ref 0–?)
Specific Gravity, Urine: 1.015 (ref 1.000–1.030)
Total Protein, Urine: NEGATIVE
Urine Glucose: NEGATIVE
Urobilinogen, UA: 0.2 (ref 0.0–1.0)
pH: 6 (ref 5.0–8.0)

## 2019-04-30 LAB — BASIC METABOLIC PANEL
BUN: 7 mg/dL (ref 6–23)
CO2: 28 mEq/L (ref 19–32)
Calcium: 9.5 mg/dL (ref 8.4–10.5)
Chloride: 103 mEq/L (ref 96–112)
Creatinine, Ser: 0.66 mg/dL (ref 0.40–1.20)
GFR: 95.13 mL/min (ref >60.00)
Glucose, Bld: 90 mg/dL (ref 70–99)
Potassium: 3.8 mEq/L (ref 3.5–5.1)
Sodium: 139 mEq/L (ref 135–145)

## 2019-04-30 LAB — LIPID PANEL
Cholesterol: 165 mg/dL (ref 0–200)
HDL: 70.2 mg/dL (ref >39.00)
LDL Cholesterol: 84 mg/dL (ref 0–99)
NonHDL: 95.08
Total CHOL/HDL Ratio: 2
Triglycerides: 57 mg/dL (ref 0.0–149.0)
VLDL: 11.4 mg/dL (ref 0.0–40.0)

## 2019-04-30 LAB — HEPATIC FUNCTION PANEL
ALT: 20 U/L (ref 0–35)
AST: 19 U/L (ref 0–37)
Albumin: 4 g/dL (ref 3.5–5.2)
Alkaline Phosphatase: 64 U/L (ref 39–117)
Bilirubin, Direct: 0.1 mg/dL (ref 0.0–0.3)
Total Bilirubin: 0.3 mg/dL (ref 0.2–1.2)
Total Protein: 7.3 g/dL (ref 6.0–8.3)

## 2019-04-30 LAB — HEMOGLOBIN A1C: Hgb A1c MFr Bld: 5.9 % (ref 4.6–6.5)

## 2019-04-30 LAB — TSH: TSH: 0.77 u[IU]/mL (ref 0.35–4.50)

## 2019-04-30 NOTE — Assessment & Plan Note (Signed)
stable overall by history and exam, recent data reviewed with pt, and pt to continue medical treatment as before,  to f/u any worsening symptoms or concerns  

## 2019-04-30 NOTE — Patient Instructions (Addendum)
Please take OTC Vitamin D3 at 2000 units per day, indefinitely  Please continue all other medications as before, and refills have been done if requested.  Please have the pharmacy call with any other refills you may need.  Please continue your efforts at being more active, low cholesterol diet, and weight control.  You are otherwise up to date with prevention measures today.  Please keep your appointments with your specialists as you may have planned  Please go to the LAB at the blood drawing area for the tests to be done  You will be contacted by phone if any changes need to be made immediately.  Otherwise, you will receive a letter about your results with an explanation, but please check with MyChart first.  Please remember to sign up for MyChart if you have not done so, as this will be important to you in the future with finding out test results, communicating by private email, and scheduling acute appointments online when needed.  Please make an Appointment to return for your 1 year visit, or sooner if needed 

## 2019-04-30 NOTE — Assessment & Plan Note (Signed)

## 2019-04-30 NOTE — Progress Notes (Signed)
Subjective:    Patient ID: Hannah Barron, female    DOB: 13-Dec-1970, 49 y.o.   MRN: [id number]  HPI  Here for wellness and f/u;  Overall doing ok;  Pt denies Chest pain, worsening SOB, DOE, wheezing, orthopnea, PND, worsening LE edema, palpitations, dizziness or syncope.  Pt denies neurological change such as new headache, facial or extremity weakness.  Pt denies polydipsia, polyuria, or low sugar symptoms. Pt states overall good compliance with treatment and medications, good tolerability, and has been trying to follow appropriate diet.  Pt denies worsening depressive symptoms, suicidal ideation or panic. No fever, night sweats, wt loss, loss of appetite, or other constitutional symptoms.  Pt states good ability with ADL's, has low fall risk, home safety reviewed and adequate, no other significant changes in hearing or vision, and only occasionally active with exercise. Working on better diet with lower chol.    Past Medical History:  Diagnosis Date  . Anemia   . Bronchiectasis ([place])   . COPD (chronic obstructive pulmonary disease) ([place])   . GERD (gastroesophageal reflux disease)   . Migraines   . Paroxysmal SVT (supraventricular tachycardia) ([place])    a. diagnosed in 11/2015.  . Right leg numbness    Past Surgical History:  Procedure Laterality Date  . CESAREAN SECTION    . CYSTOSCOPY N/A 04/05/2016   Procedure: CYSTOSCOPY;  Surgeon: Lavonia Drafts, MD;  Location: [facility];  Service: Gynecology;  Laterality: N/A;  . LAPAROSCOPIC VAGINAL HYSTERECTOMY WITH SALPINGECTOMY Bilateral 04/05/2016   Procedure: LAPAROSCOPIC ASSISTED VAGINAL HYSTERECTOMY WITH SALPINGECTOMY;  Surgeon: Lavonia Drafts, MD;  Location: [facility];  Service: Gynecology;  Laterality: Bilateral;  . LUNG BIOPSY    . VIDEO BRONCHOSCOPY Bilateral 03/29/2016   Procedure: VIDEO BRONCHOSCOPY WITHOUT FLUORO;  Surgeon: Marshell Garfinkel, MD;  Location: [facility];  Service: Cardiopulmonary;  Laterality: Bilateral;    reports  that she has never smoked. She has never used smokeless tobacco. She reports that she does not drink alcohol or use drugs. family history includes Healthy in her father and mother. No Known Allergies Current Outpatient Medications on File Prior to Visit  Medication Sig Dispense Refill  . albuterol (PROVENTIL HFA;VENTOLIN HFA) 108 (90 Base) MCG/ACT inhaler INHALE 2 PUFFS INTO THE LUNGS EVERY 6 HOURS AS NEEDED FOR WHEEZING OR SHORTNESS OF BREATH 54 Inhaler 3  . atorvastatin (LIPITOR) 20 MG tablet Take 1 tablet (20 mg total) by mouth daily. 90 tablet 3  . budesonide-formoterol (SYMBICORT) 160-4.5 MCG/ACT inhaler Inhale 2 puffs into the lungs 2 (two) times daily. 1 Inhaler 5  . CLARAVIS 40 MG capsule Take 2 capsules by mouth daily.    . diclofenac (CATAFLAM) 50 MG tablet Take 50 mg by mouth 3 (three) times daily as needed.    . dicyclomine (BENTYL) 20 MG tablet Take 20 mg by mouth every 6 (six) hours.    . metoprolol succinate (TOPROL-XL) 50 MG 24 hr tablet TAKE 1 TABLET IN MORNING & 1/2 TABLET IN EVENING *TAKE 1/2 TAB AS NEEDED FOR PALPITATIONS/RAPID HR* 180 tablet 3  . omeprazole (PRILOSEC) 40 MG capsule TAKE 1 CAPSULE BY MOUTH EVERY DAY 90 capsule 3  . Respiratory Therapy Supplies (FLUTTER) DEVI Use as directed. 1 each 0  . RESTASIS 0.05 % ophthalmic emulsion Instill 1 drop into both eyes two times a day  2   No current facility-administered medications on file prior to visit.   Review of Systems All otherwise neg per pt    Objective:   Physical Exam BP  120/70 (BP Location: Left Arm, Patient Position: Sitting, Cuff Size: Large)   Pulse 73   Temp 99 F (37.2 C) (Oral)   Ht 5\' 6"  (1.676 m)   Wt 163 lb (73.9 kg)   LMP 03/31/2016   SpO2 98%   BMI 26.31 kg/m  VS noted,  Constitutional: Pt appears in NAD HENT: Head: NCAT.  Right Ear: External ear normal.  Left Ear: External ear normal.  Eyes: . Pupils are equal, round, and reactive to light. Conjunctivae and EOM are normal Nose:  without d/c or deformity Neck: Neck supple. Gross normal ROM Cardiovascular: Normal rate and regular rhythm.   Pulmonary/Chest: Effort normal and breath sounds without rales or wheezing.  Abd:  Soft, NT, ND, + BS, no organomegaly Neurological: Pt is alert. At baseline orientation, motor grossly intact Skin: Skin is warm. No rashes, other new lesions, no LE edema Psychiatric: Pt behavior is normal without agitation  All otherwise neg per pt Lab Results  Component Value Date   WBC 4.6 04/30/2019   HGB 12.4 04/30/2019   HCT 37.2 04/30/2019   PLT 261.0 04/30/2019   GLUCOSE 90 04/30/2019   CHOL 165 04/30/2019   TRIG 57.0 04/30/2019   HDL 70.20 04/30/2019   LDLCALC 84 04/30/2019   ALT 20 04/30/2019   AST 19 04/30/2019   NA 139 04/30/2019   K 3.8 04/30/2019   CL 103 04/30/2019   CREATININE 0.66 04/30/2019   BUN 7 04/30/2019   CO2 28 04/30/2019   TSH 0.77 04/30/2019   INR 1.1 10/27/2014   HGBA1C 5.9 04/30/2019      Assessment & Plan:

## 2019-05-07 ENCOUNTER — Ambulatory Visit: Payer: Medicare HMO | Admitting: Pulmonary Disease

## 2019-05-21 ENCOUNTER — Ambulatory Visit (INDEPENDENT_AMBULATORY_CARE_PROVIDER_SITE_OTHER): Payer: Medicare HMO

## 2019-05-21 ENCOUNTER — Ambulatory Visit: Payer: Medicare HMO | Admitting: Pulmonary Disease

## 2019-05-21 ENCOUNTER — Encounter: Payer: Self-pay | Admitting: Pulmonary Disease

## 2019-05-21 ENCOUNTER — Other Ambulatory Visit: Payer: Self-pay

## 2019-05-21 VITALS — BP 116/78 | HR 80 | Temp 97.6°F | Ht 67.0 in | Wt 164.8 lb

## 2019-05-21 DIAGNOSIS — R0600 Dyspnea, unspecified: Secondary | ICD-10-CM

## 2019-05-21 DIAGNOSIS — R0609 Other forms of dyspnea: Secondary | ICD-10-CM

## 2019-05-21 DIAGNOSIS — J471 Bronchiectasis with (acute) exacerbation: Secondary | ICD-10-CM

## 2019-05-21 IMAGING — DX DG CHEST 2V
2 series · 2 of 2 positions shown · non-contrast
Comparison: [DATE]

CLINICAL DATA: Dyspnea on exertion

EXAM:
CHEST - 2 VIEW

[chest pa]
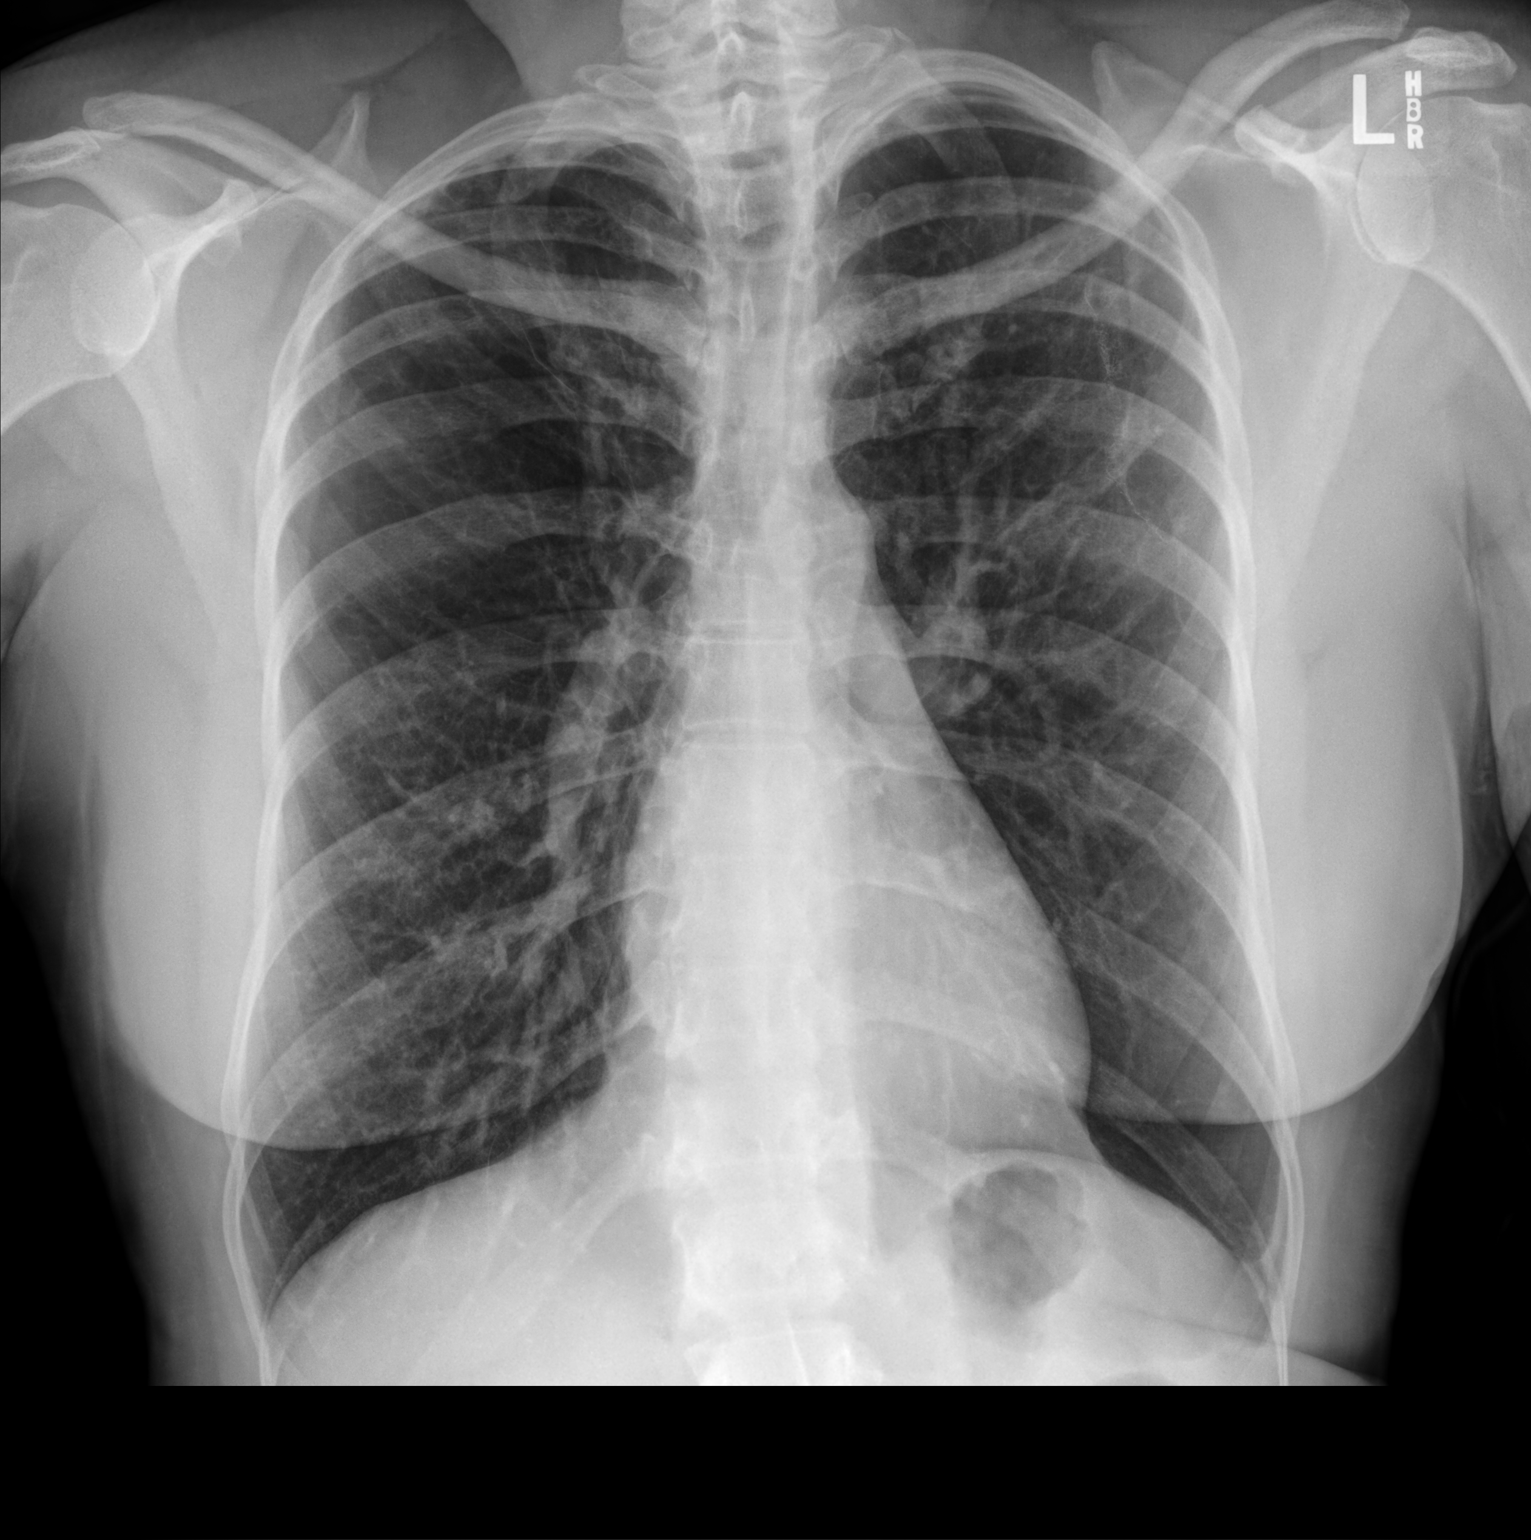

[chest lat]
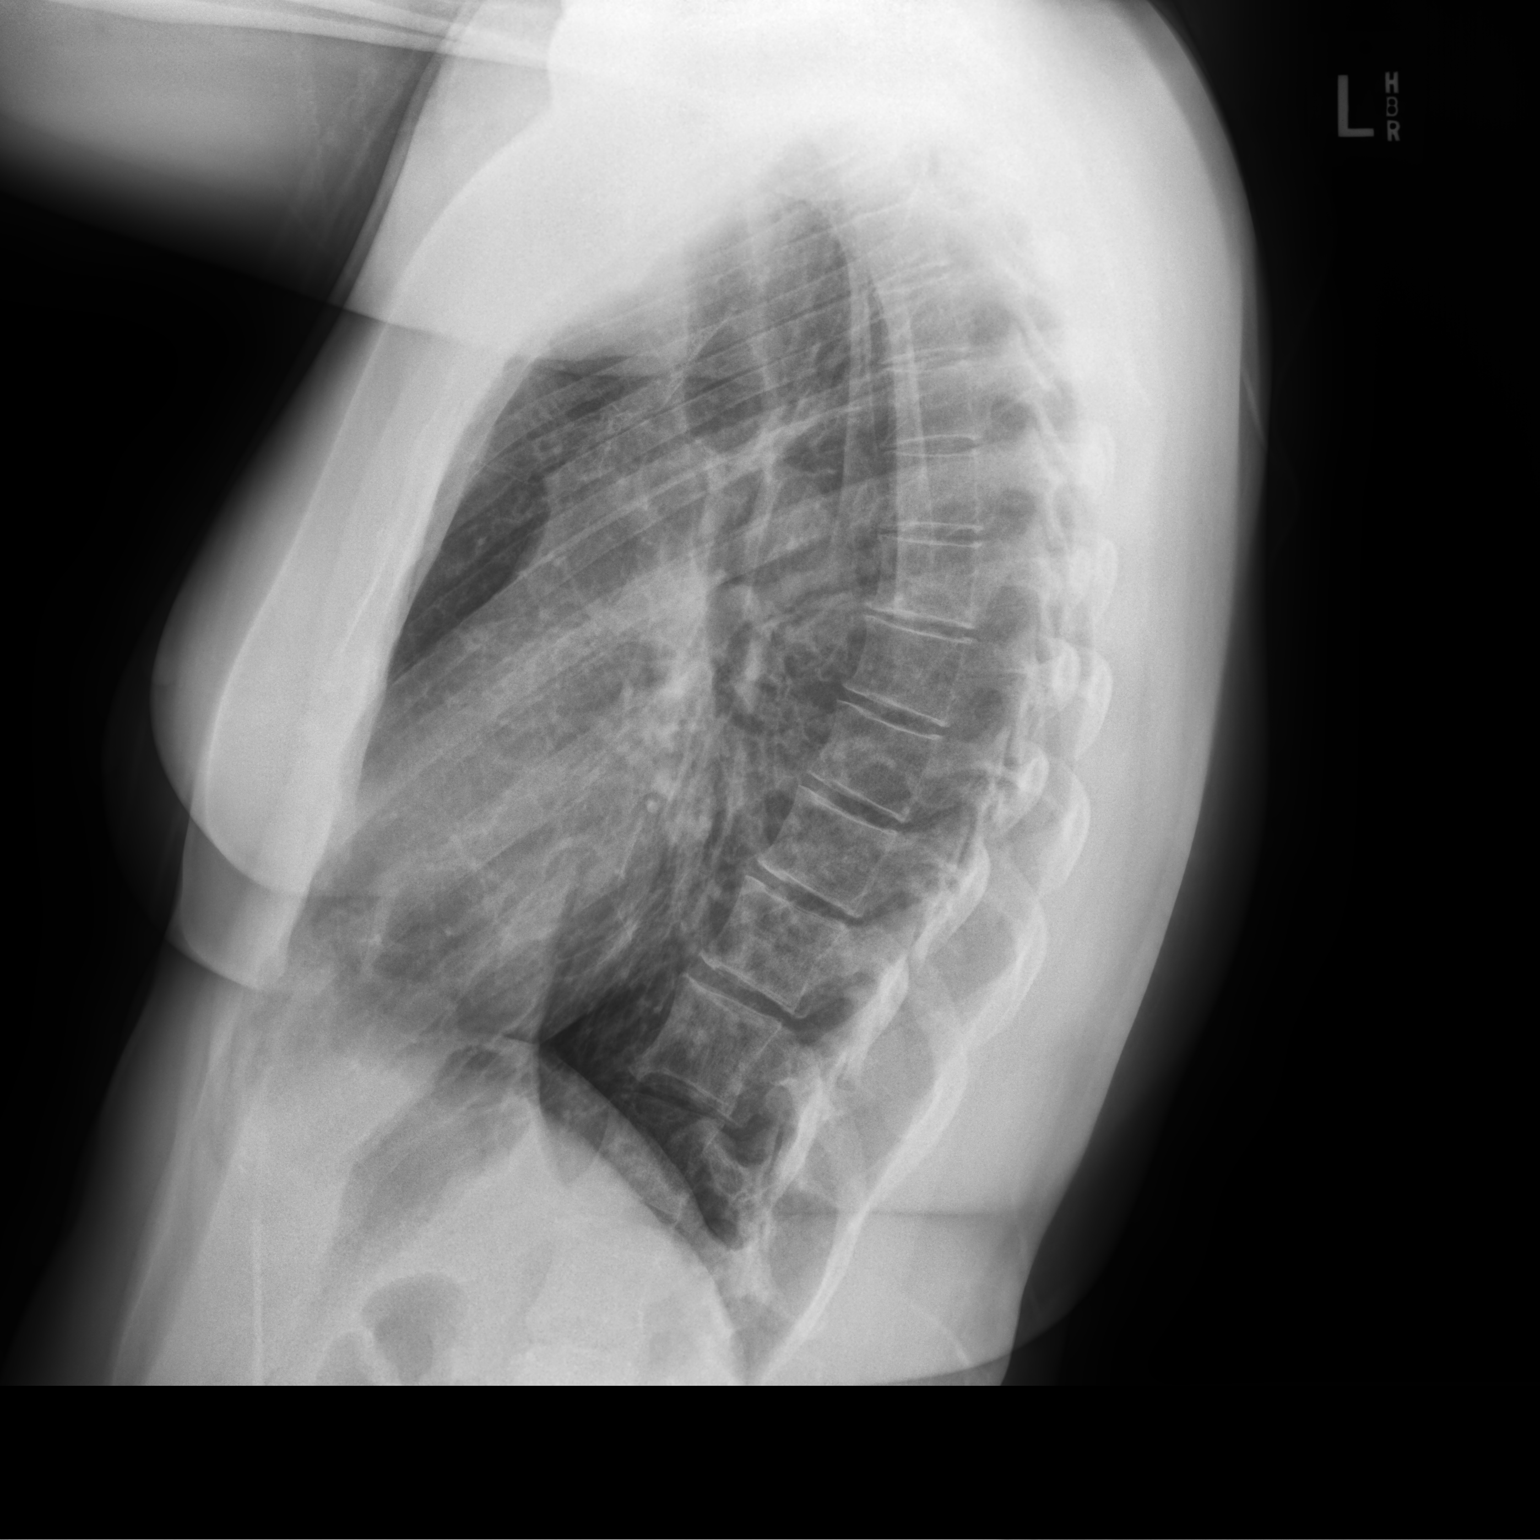

[2 of 2 positions shown; findings below may reference images not displayed]

FINDINGS: Cardiomediastinal contours and hilar structures are normal.

Lungs are clear aside from chronic biapical pleuroparenchymal
scarring. No pleural effusion.

Visualized skeletal structures are unremarkable.
IMPRESSION: No acute cardiopulmonary disease.

## 2019-05-21 NOTE — Patient Instructions (Addendum)
I am glad your symptoms are good overall. Continue to use your Symbicort daily  and Albuterol as needed. Continue to use your flutter valve and Mucinex . We will get a chest xray today to evaluate your lungs and why you are having increased mucus production at night.   We will follow up and see why you never received the percussion vest.    - Chest xray 2 view - Follow up on percussion vest order - Follow up in 3 months

## 2019-05-21 NOTE — Progress Notes (Signed)
Hannah Barron    [id number]    06-16-1970  Primary Care Physician:John, Hunt Oris, MD  Referring Physician: Biagio Borg, MD [address] [place],  [place] [address]  Chief complaint:  Follow up for  Cystic bronchiectasis  HPI: 49 year old with chronic obstructive lung disease, bronchiectasis. She was previously followed at [place] with a VATS biopsy in 2003 and was told she had cystic bronchiectasis. She had followed up with a pulmonologist at [facility] but has moved to [place] on 2017. We have been unable to get records from [place] in spite of multiple attempts.  She is an immigrant from [place] in 1998 and does not report any exposure to tuberculosis. She does not recall getting a BCG vaccine as a child. Underwent a bronchoscopy to evaluate for MAI infection and a positive quantiferon as she was unable to give a good sputum sample. All results are negative except for positive PJP on DFA. She underwent further evaluation with negative HIV and beta glucan tests.  The PJP test was thought to be false positive She had a hysterectomy for uterine fibroids  On 3/27  08/09/2018 Seen at last visit for bronchiectasis exacerbation which was treated with Z-Pak and prednisone States that she is improved back to baseline Still has occasional cough with mucus production Using her Symbicort.  Needs albuterol about every other day  Interim history: Patient denies any recent exacerbations.  During the day her symptoms are stable.  For the past 3 months her cough and mucus production at night have been worse. She did move to a new location 3 months ago, but reports this place is clean and new.    Outpatient Encounter Medications as of 05/21/2019  Medication Sig  . albuterol (PROVENTIL HFA;VENTOLIN HFA) 108 (90 Base) MCG/ACT inhaler INHALE 2 PUFFS INTO THE LUNGS EVERY 6 HOURS AS NEEDED FOR WHEEZING OR SHORTNESS OF BREATH  . atorvastatin (LIPITOR) 20 MG tablet Take 1 tablet (20  mg total) by mouth daily.  . budesonide-formoterol (SYMBICORT) 160-4.5 MCG/ACT inhaler Inhale 2 puffs into the lungs 2 (two) times daily.  . diclofenac (CATAFLAM) 50 MG tablet Take 50 mg by mouth 3 (three) times daily as needed.  . dicyclomine (BENTYL) 20 MG tablet Take 20 mg by mouth every 6 (six) hours.  . metoprolol succinate (TOPROL-XL) 50 MG 24 hr tablet TAKE 1 TABLET IN MORNING & 1/2 TABLET IN EVENING *TAKE 1/2 TAB AS NEEDED FOR PALPITATIONS/RAPID HR*  . omeprazole (PRILOSEC) 40 MG capsule TAKE 1 CAPSULE BY MOUTH EVERY DAY  . Respiratory Therapy Supplies (FLUTTER) DEVI Use as directed.  . RESTASIS 0.05 % ophthalmic emulsion Instill 1 drop into both eyes two times a day  . CLARAVIS 40 MG capsule Take 2 capsules by mouth daily.   No facility-administered encounter medications on file as of 05/21/2019.   Physical Exam: Blood pressure 112/62, pulse 88, temperature 98 F (36.7 C), temperature source Oral, height 5' 6.5" (1.689 m), weight 176 lb 12.8 oz (80.2 kg), last menstrual period 03/31/2016, SpO2 98 %. General: NAD, nl appearance HE: Normocephalic, atraumatic , EOMI, Conjunctivae normal ENT: No congestion, no rhinorrhea, no exudate or erythema  Cardiovascular: Normal rate, regular rhythm.  No murmurs, rubs, or gallops Pulmonary : Effort normal, breath sounds normal. No wheezes, rales, or rhonchi Abdominal: soft, nontender,  bowel sounds present Musculoskeletal: no swelling , deformity, injury ,or tenderness in extremities, Skin: Warm, dry , no bruising, erythema, or rash Psychiatric/Behavioral:  normal mood, normal behavior   Data Reviewed: Imaging CT high resolution 07/16/15-areas of cystic and varicose bronchiectasis, extensive air trapping, multiple pulmonary nodules. CT chest 03/21/16-areas of bronchiectasis, air trapping and pulmonary nodules. Unchanged compared to 2017 CT angiogram 12/23/16-stable areas of bronchiectasis, air trapping and pulmonary nodules.  No pulmonary  embolism. Chest x-ray 2/24- chronic biapical scarring.  Postsurgical changes in the left upper lobe.  I have reviewed the images personally. I have reviewed all images personally.  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 Discussed INH therapy but defer as per pt wishes CF panel 03/25/15- negative for 97 mutations analyzed (report scanned)  Cardiac Myocardial perfusion study 12/2015 >neg  Ischemia , EF 55% Echo 10/2015 >EF 65%.  Echo 11/2018 > EF 65%, normal valves, no wall motion abnormalities   Bronchoscopy 03/29/16 Cultures, AFB, fungal cultures-negative to date PJP DFA-positive Cytology-no malignant cells, CD4: CD8 ratio-1.62 Cell count WBC-26, 26% lymphs, 50% neutrophils, 24% monocyte macrophage  Assessment:  Cystic bronchiectasis She's had workup which is negative for alpha-1 antitrypsin, immunoglobulin deficiency, autoimmune disease, cystic fibrosis and mycobacterial infections.    Overall she reports she is doing well except for at night. She has had increased mucus production for about 3 months at night and wakes up sob. Plan to order a chest xray to better evaluate. She denies any fever or chills.   Continues on Symbicort, albuterol as needed. Continue Mucinex and flutter valve   Reports she never received percussion vest ordered for optimal mucociliary clearance. Will follow up on this order.   Postive quantiferon Plan/Recommendations: - Follow up on percussion vest order  - Continue symbicort, albuterol - Continue Flutter valve,  Mucinex - Follow up in 3 months   Tamsen Snider, MD PGY1    Attending note: I have seen and examined the patient. History, labs and imaging reviewed. Agree with assessment and plan She has separated from her husband and feels significantly less stress and is happier. Overall she is doing well.  Complains of increased mucus production at night. We had ordered a percussion vest but no delivered.  She continues on flutter valve, Mucinex  Chest x-ray today Re order for percussion vest Continue Symbicort, flutter valve and Mucinex.  Marshell Garfinkel MD [facility] 05/22/2019, 9:19 AM   CC: Biagio Borg, MD

## 2019-05-23 ENCOUNTER — Telehealth: Payer: Self-pay | Admitting: Pulmonary Disease

## 2019-05-23 NOTE — Telephone Encounter (Signed)
Hannah Garfinkel, MD  05/22/2019 9:34 AM EDT    Please let patient know chest x-ray does not show any acute abnormality.   Spoke with pt, aware of results/recs.  Nothing further needed.

## 2019-05-31 DIAGNOSIS — J479 Bronchiectasis, uncomplicated: Secondary | ICD-10-CM | POA: Diagnosis not present

## 2019-06-28 ENCOUNTER — Encounter: Payer: Self-pay | Admitting: Internal Medicine

## 2019-06-28 ENCOUNTER — Ambulatory Visit (INDEPENDENT_AMBULATORY_CARE_PROVIDER_SITE_OTHER): Payer: Medicare HMO | Admitting: Internal Medicine

## 2019-06-28 ENCOUNTER — Other Ambulatory Visit: Payer: Self-pay

## 2019-06-28 ENCOUNTER — Ambulatory Visit (INDEPENDENT_AMBULATORY_CARE_PROVIDER_SITE_OTHER): Payer: Medicare HMO

## 2019-06-28 VITALS — BP 112/66 | HR 68 | Temp 98.6°F | Ht 67.0 in | Wt 161.0 lb

## 2019-06-28 DIAGNOSIS — M545 Low back pain, unspecified: Secondary | ICD-10-CM

## 2019-06-28 DIAGNOSIS — R350 Frequency of micturition: Secondary | ICD-10-CM | POA: Diagnosis not present

## 2019-06-28 DIAGNOSIS — K219 Gastro-esophageal reflux disease without esophagitis: Secondary | ICD-10-CM | POA: Diagnosis not present

## 2019-06-28 DIAGNOSIS — R739 Hyperglycemia, unspecified: Secondary | ICD-10-CM | POA: Diagnosis not present

## 2019-06-28 LAB — URINALYSIS, ROUTINE W REFLEX MICROSCOPIC
Bilirubin Urine: NEGATIVE
Hgb urine dipstick: NEGATIVE
Ketones, ur: NEGATIVE
Leukocytes,Ua: NEGATIVE
Nitrite: NEGATIVE
RBC / HPF: NONE SEEN (ref 0–?)
Specific Gravity, Urine: 1.02 (ref 1.000–1.030)
Total Protein, Urine: NEGATIVE
Urine Glucose: NEGATIVE
Urobilinogen, UA: 0.2 (ref 0.0–1.0)
pH: 6 (ref 5.0–8.0)

## 2019-06-28 IMAGING — DX DG LUMBAR SPINE COMPLETE 4+V
5 series · 5 of 5 positions shown · non-contrast
Comparison: Lumbar radiograph [DATE]

CLINICAL DATA: Persistent lumbosacral back pain for 2 weeks. No
known injury.

EXAM:
LUMBAR SPINE - COMPLETE 4+ VIEW

[l-spine ap]
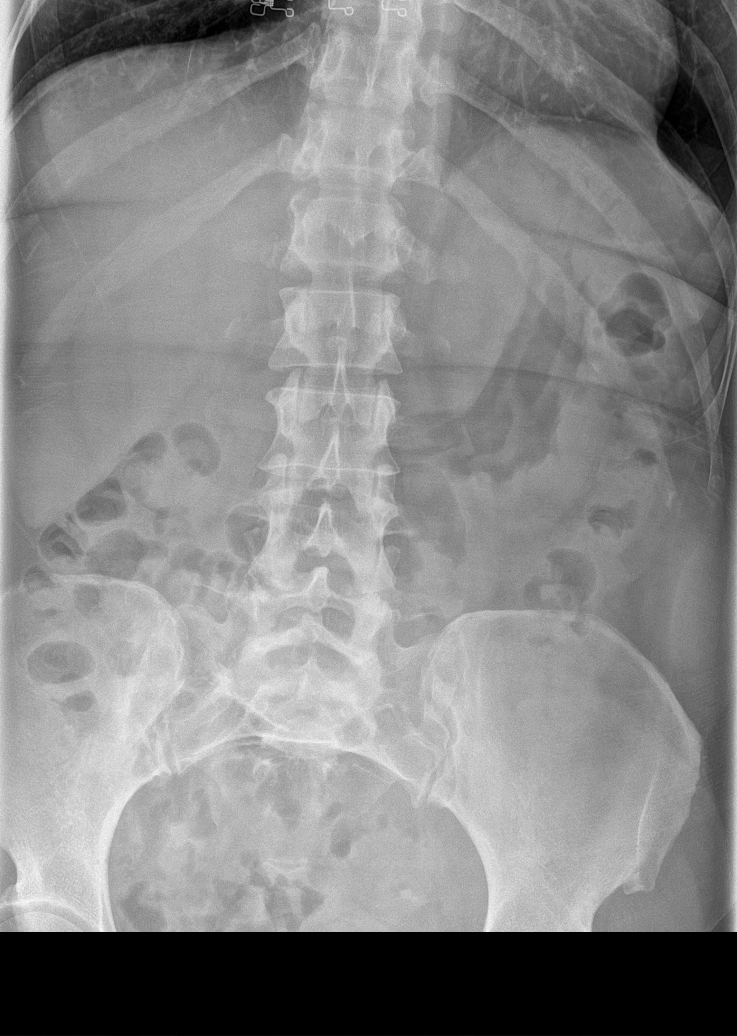

[l-spine obl (1 of 2)]
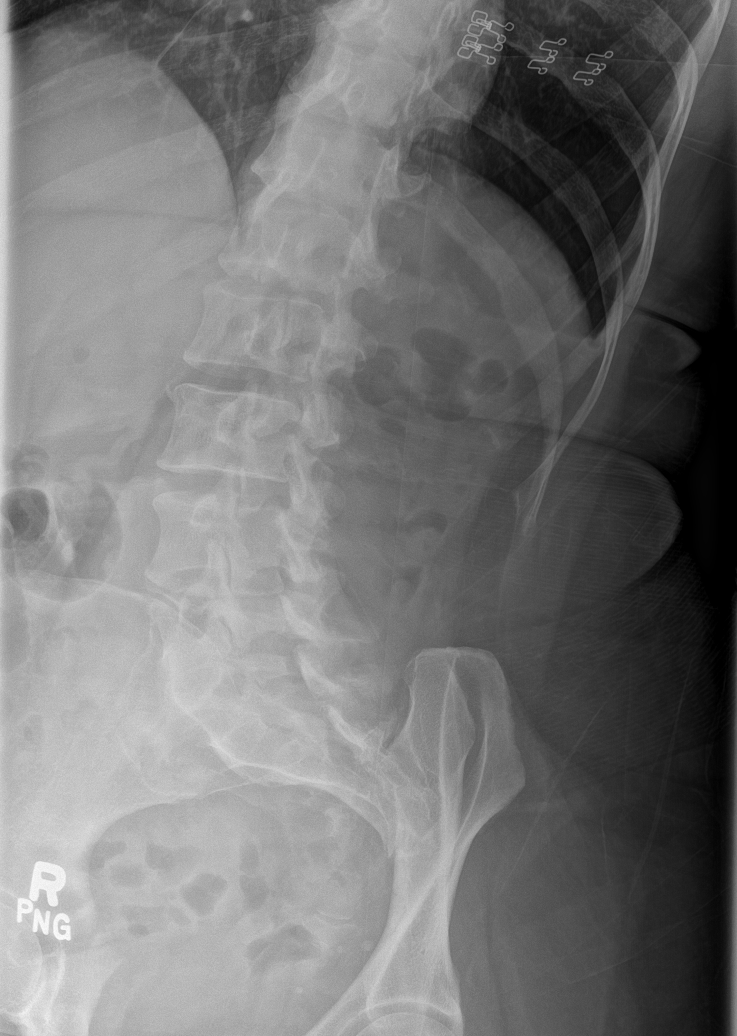

[l-spine obl (2 of 2)]
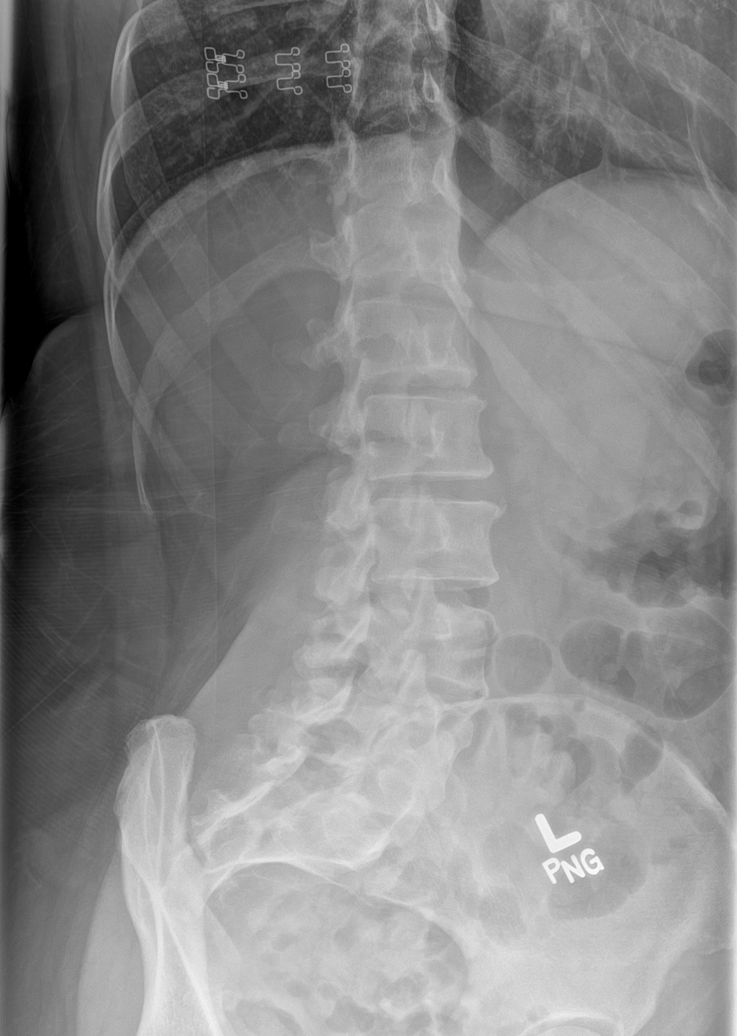

[l-spine lateral]
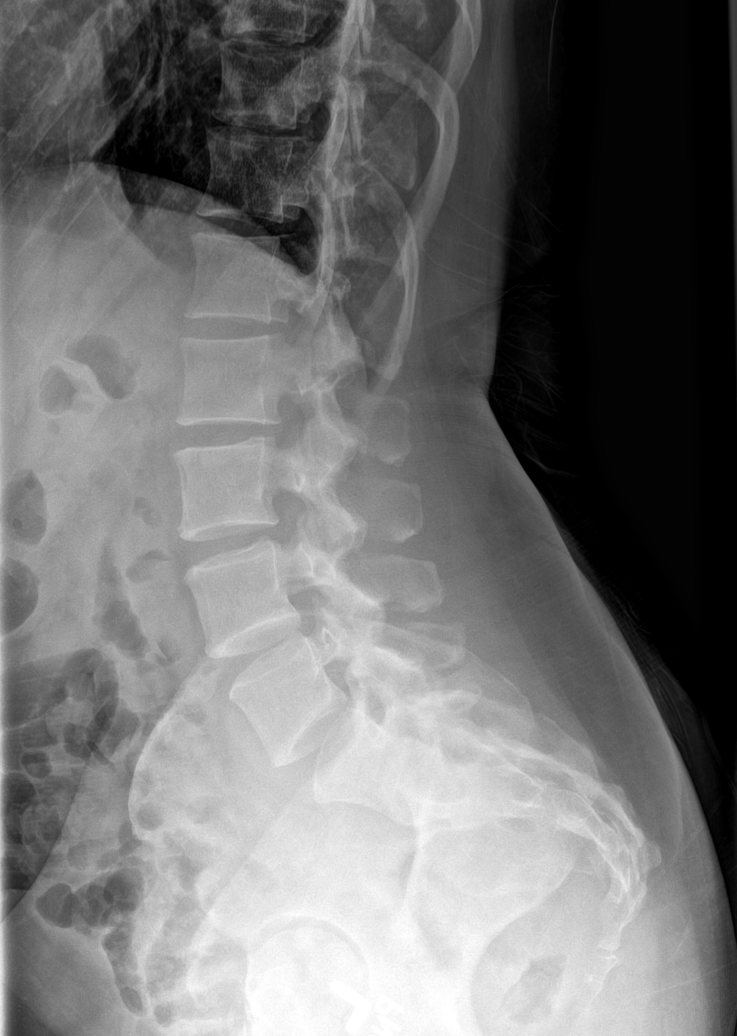

[l-spine spot]
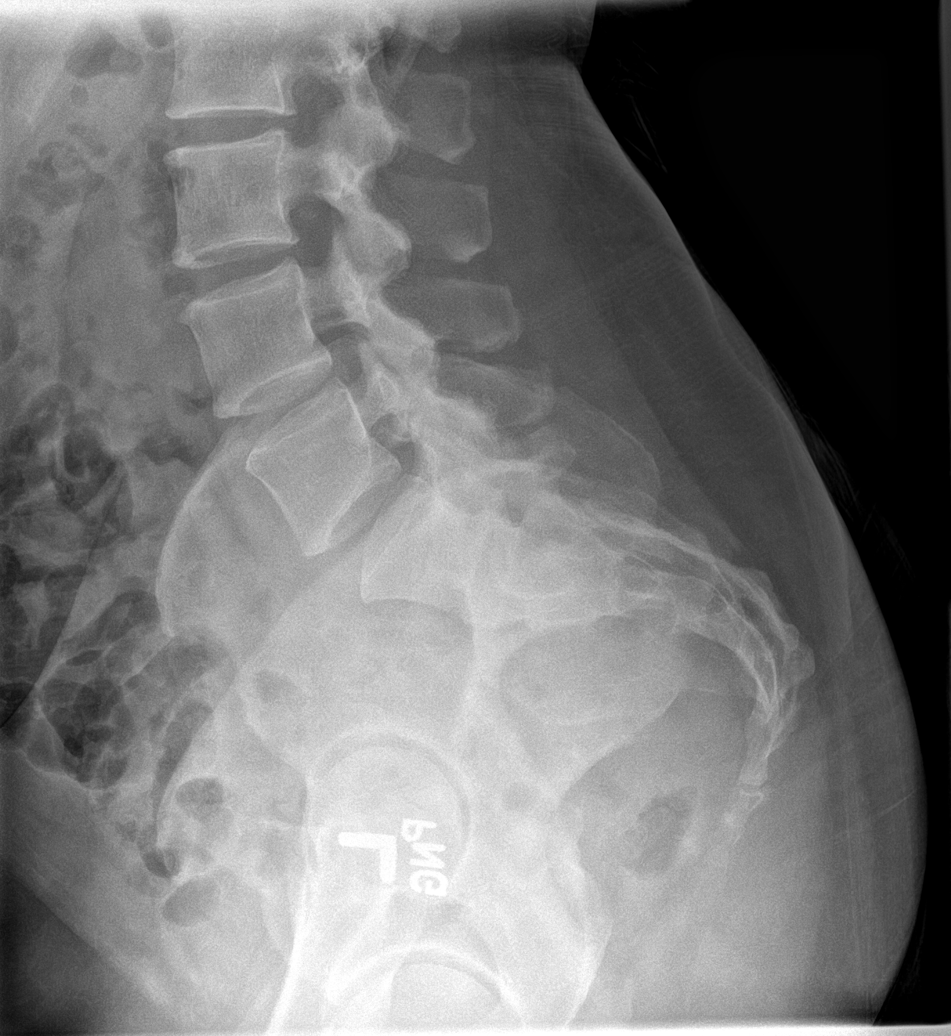

[5 of 5 positions shown; findings below may reference images not displayed]

FINDINGS: The alignment is maintained. Vertebral body heights are normal.
There is no listhesis. The posterior elements are intact. Disc
spaces are preserved. No fracture. No evidence of focal bone lesion.
Sacroiliac joints are symmetric and normal.
IMPRESSION: Negative radiographs of the lumbar spine.

## 2019-06-28 MED ORDER — DICYCLOMINE HCL 20 MG PO TABS: 20.0000 mg | ORAL_TABLET | Freq: Four times a day (QID) | ORAL | 1 refills | Status: DC

## 2019-06-28 MED ORDER — TRAMADOL HCL 50 MG PO TABS: 50.0000 mg | ORAL_TABLET | Freq: Four times a day (QID) | ORAL | 0 refills | Status: DC | PRN

## 2019-06-28 MED ORDER — CYCLOBENZAPRINE HCL 5 MG PO TABS: 5.0000 mg | ORAL_TABLET | Freq: Three times a day (TID) | ORAL | 1 refills | Status: DC | PRN

## 2019-06-28 MED ORDER — CIPROFLOXACIN HCL 500 MG PO TABS: 500.0000 mg | ORAL_TABLET | Freq: Two times a day (BID) | ORAL | 0 refills | Status: AC

## 2019-06-28 NOTE — Patient Instructions (Signed)
Please take all new medication as prescribed - the antibiotic (cipro), tramadol (for pain), cyclobenzaprine 5 mg as needed for muscle relaxer  Please continue all other medications as before, including the dicyclomine for the stomach  Please have the pharmacy call with any other refills you may need.  Please keep your appointments with your specialists as you may have planned  Please go to the XRAY Department in the first floor for the x-ray testing  Please go to the LAB at the blood drawing area for the tests to be done - just the urine testing today  You will be contacted by phone if any changes need to be made immediately.  Otherwise, you will receive a letter about your results with an explanation, but please check with MyChart first.  Please remember to sign up for MyChart if you have not done so, as this will be important to you in the future with finding out test results, communicating by private email, and scheduling acute appointments online when needed.

## 2019-06-28 NOTE — Progress Notes (Signed)
Subjective:    Patient ID: Hannah Barron, female    DOB: 1970/12/10, 49 y.o.   MRN: [id number]  HPI  Here with 3 days onset left and low middle abd pain dull and sharp, intermittent, mild to mod, nothing seems to make better or worse and Denies worsening reflux, dysphagia, n/v, bowel change or blood.  Also Pt continues to have 2 wks recurring sharp and dull low midline and left LBP without bowel or bladder change, fever, wt loss,  worsening LE pain/numbness/weakness, gait change or falls, but worse to bend or stand up from sitting.  Also asks for IBS Tx refill dicyclomine as this seemed to help.  Pt denies chest pain, increased sob or doe, wheezing, orthopnea, PND, increased LE swelling, palpitations, dizziness or syncope.  Pt denies new neurological symptoms such as new headache, or facial or extremity weakness or numbness   Pt denies polydipsia, polyuria Past Medical History:  Diagnosis Date  . Anemia   . Bronchiectasis ([place])   . COPD (chronic obstructive pulmonary disease) ([place])   . GERD (gastroesophageal reflux disease)   . Migraines   . Paroxysmal SVT (supraventricular tachycardia) ([place])    a. diagnosed in 11/2015.  . Right leg numbness    Past Surgical History:  Procedure Laterality Date  . CESAREAN SECTION    . CYSTOSCOPY N/A 04/05/2016   Procedure: CYSTOSCOPY;  Surgeon: Lavonia Drafts, MD;  Location: [facility];  Service: Gynecology;  Laterality: N/A;  . LAPAROSCOPIC VAGINAL HYSTERECTOMY WITH SALPINGECTOMY Bilateral 04/05/2016   Procedure: LAPAROSCOPIC ASSISTED VAGINAL HYSTERECTOMY WITH SALPINGECTOMY;  Surgeon: Lavonia Drafts, MD;  Location: [facility];  Service: Gynecology;  Laterality: Bilateral;  . LUNG BIOPSY    . VIDEO BRONCHOSCOPY Bilateral 03/29/2016   Procedure: VIDEO BRONCHOSCOPY WITHOUT FLUORO;  Surgeon: Marshell Garfinkel, MD;  Location: [facility];  Service: Cardiopulmonary;  Laterality: Bilateral;    reports that she has never smoked. She has never used smokeless  tobacco. She reports that she does not drink alcohol and does not use drugs. family history includes Healthy in her father and mother. No Known Allergies Current Outpatient Medications on File Prior to Visit  Medication Sig Dispense Refill  . albuterol (PROVENTIL HFA;VENTOLIN HFA) 108 (90 Base) MCG/ACT inhaler INHALE 2 PUFFS INTO THE LUNGS EVERY 6 HOURS AS NEEDED FOR WHEEZING OR SHORTNESS OF BREATH 54 Inhaler 3  . atorvastatin (LIPITOR) 20 MG tablet Take 1 tablet (20 mg total) by mouth daily. 90 tablet 3  . budesonide-formoterol (SYMBICORT) 160-4.5 MCG/ACT inhaler Inhale 2 puffs into the lungs 2 (two) times daily. 1 Inhaler 5  . CLARAVIS 40 MG capsule Take 2 capsules by mouth daily.    . diclofenac (CATAFLAM) 50 MG tablet Take 50 mg by mouth 3 (three) times daily as needed.    . metoprolol succinate (TOPROL-XL) 50 MG 24 hr tablet TAKE 1 TABLET IN MORNING & 1/2 TABLET IN EVENING *TAKE 1/2 TAB AS NEEDED FOR PALPITATIONS/RAPID HR* 180 tablet 3  . omeprazole (PRILOSEC) 40 MG capsule TAKE 1 CAPSULE BY MOUTH EVERY DAY 90 capsule 3  . Respiratory Therapy Supplies (FLUTTER) DEVI Use as directed. 1 each 0  . RESTASIS 0.05 % ophthalmic emulsion Instill 1 drop into both eyes two times a day  2   No current facility-administered medications on file prior to visit.   Review of Systems All otherwise neg per pt     Objective:   Physical Exam BP 112/66 (BP Location: Left Arm, Patient Position: Sitting, Cuff Size: Large)   Pulse  68   Temp 98.6 F (37 C) (Oral)   Ht 5\' 7"  (1.702 m)   Wt 161 lb (73 kg)   LMP 03/31/2016   SpO2 98%   BMI 25.22 kg/m  VS noted,  Constitutional: Pt appears in NAD HENT: Head: NCAT.  Right Ear: External ear normal.  Left Ear: External ear normal.  Eyes: . Pupils are equal, round, and reactive to light. Conjunctivae and EOM are normal Nose: without d/c or deformity Neck: Neck supple. Gross normal ROM Cardiovascular: Normal rate and regular rhythm.   Pulmonary/Chest:  Effort normal and breath sounds without rales or wheezing.  Abd:  Soft, mild low mid abd tender, ND, + BS, no organomegaly, no guarding or rebound, no flank tender Neurological: Pt is alert. At baseline orientation, motor grossly intact Skin: Skin is warm. No rashes, other new lesions, no LE edema Psychiatric: Pt behavior is normal without agitation  All otherwise neg per pt Lab Results  Component Value Date   WBC 4.6 04/30/2019   HGB 12.4 04/30/2019   HCT 37.2 04/30/2019   PLT 261.0 04/30/2019   GLUCOSE 90 04/30/2019   CHOL 165 04/30/2019   TRIG 57.0 04/30/2019   HDL 70.20 04/30/2019   LDLCALC 84 04/30/2019   ALT 20 04/30/2019   AST 19 04/30/2019   NA 139 04/30/2019   K 3.8 04/30/2019   CL 103 04/30/2019   CREATININE 0.66 04/30/2019   BUN 7 04/30/2019   CO2 28 04/30/2019   TSH 0.77 04/30/2019   INR 1.1 10/27/2014   HGBA1C 5.9 04/30/2019      Assessment & Plan:

## 2019-06-29 ENCOUNTER — Encounter: Payer: Self-pay | Admitting: Internal Medicine

## 2019-06-29 DIAGNOSIS — R350 Frequency of micturition: Secondary | ICD-10-CM | POA: Insufficient documentation

## 2019-06-29 DIAGNOSIS — M545 Low back pain, unspecified: Secondary | ICD-10-CM | POA: Insufficient documentation

## 2019-06-29 LAB — URINE CULTURE

## 2019-06-29 NOTE — Assessment & Plan Note (Signed)
stable overall by history and exam, recent data reviewed with pt, and pt to continue medical treatment as before,  to f/u any worsening symptoms or concerns  

## 2019-06-29 NOTE — Assessment & Plan Note (Signed)
C/w likely underlying lumbar msk issue such as ddd or djd; for limited tramadol and flexeril prn,  to f/u any worsening symptoms or concerns

## 2019-06-29 NOTE — Assessment & Plan Note (Addendum)
Etiology unclear, for urine studies, empiric cipro and f/u culture  I spent 31 minutes in preparing to see the patient by review of recent labs, imaging and procedures, obtaining and reviewing separately obtained history, communicating with the patient and family or caregiver, ordering medications, tests or procedures, and documenting clinical information in the EHR including the differential Dx, treatment, and any further evaluation and other management of urinary freq, lbp, hyperglycemia, gerd

## 2019-06-30 ENCOUNTER — Encounter: Payer: Self-pay | Admitting: Internal Medicine

## 2019-07-01 DIAGNOSIS — J479 Bronchiectasis, uncomplicated: Secondary | ICD-10-CM | POA: Diagnosis not present

## 2019-07-10 ENCOUNTER — Other Ambulatory Visit: Payer: Self-pay | Admitting: Internal Medicine

## 2019-07-10 NOTE — Telephone Encounter (Signed)
Please refill as per office routine med refill policy (all routine meds refilled for 3 mo or monthly per pt preference up to one year from last visit, then month to month grace period for 3 mo, then further med refills will have to be denied)  

## 2019-07-21 ENCOUNTER — Other Ambulatory Visit: Payer: Self-pay | Admitting: Internal Medicine

## 2019-07-31 DIAGNOSIS — J479 Bronchiectasis, uncomplicated: Secondary | ICD-10-CM | POA: Diagnosis not present

## 2019-08-02 ENCOUNTER — Other Ambulatory Visit: Payer: Self-pay | Admitting: Internal Medicine

## 2019-08-18 ENCOUNTER — Other Ambulatory Visit: Payer: Self-pay | Admitting: Internal Medicine

## 2019-08-31 DIAGNOSIS — J479 Bronchiectasis, uncomplicated: Secondary | ICD-10-CM | POA: Diagnosis not present

## 2019-08-31 DIAGNOSIS — H52209 Unspecified astigmatism, unspecified eye: Secondary | ICD-10-CM | POA: Diagnosis not present

## 2019-08-31 DIAGNOSIS — H524 Presbyopia: Secondary | ICD-10-CM | POA: Diagnosis not present

## 2019-08-31 DIAGNOSIS — H5203 Hypermetropia, bilateral: Secondary | ICD-10-CM | POA: Diagnosis not present

## 2019-09-03 ENCOUNTER — Other Ambulatory Visit: Payer: Self-pay | Admitting: Internal Medicine

## 2019-09-14 ENCOUNTER — Other Ambulatory Visit: Payer: Self-pay | Admitting: Internal Medicine

## 2019-09-24 ENCOUNTER — Other Ambulatory Visit: Payer: Self-pay

## 2019-09-24 ENCOUNTER — Encounter (HOSPITAL_BASED_OUTPATIENT_CLINIC_OR_DEPARTMENT_OTHER): Payer: Self-pay

## 2019-09-24 ENCOUNTER — Ambulatory Visit
Admission: EM | Admit: 2019-09-24 | Discharge: 2019-09-24 | Disposition: A | Discharge status: Home / Self Care | Payer: Medicare HMO | Attending: Physician Assistant | Admitting: Physician Assistant

## 2019-09-24 ENCOUNTER — Emergency Department (HOSPITAL_BASED_OUTPATIENT_CLINIC_OR_DEPARTMENT_OTHER): Payer: Medicare HMO

## 2019-09-24 ENCOUNTER — Emergency Department (HOSPITAL_BASED_OUTPATIENT_CLINIC_OR_DEPARTMENT_OTHER)
Admission: EM | Admit: 2019-09-24 | Discharge: 2019-09-25 | Disposition: A | Discharge status: Home / Self Care | Payer: Medicare HMO | Attending: Emergency Medicine | Admitting: Emergency Medicine

## 2019-09-24 DIAGNOSIS — J449 Chronic obstructive pulmonary disease, unspecified: Secondary | ICD-10-CM | POA: Insufficient documentation

## 2019-09-24 DIAGNOSIS — R072 Precordial pain: Secondary | ICD-10-CM

## 2019-09-24 DIAGNOSIS — I10 Essential (primary) hypertension: Secondary | ICD-10-CM | POA: Diagnosis not present

## 2019-09-24 DIAGNOSIS — R079 Chest pain, unspecified: Secondary | ICD-10-CM | POA: Diagnosis not present

## 2019-09-24 DIAGNOSIS — Z7951 Long term (current) use of inhaled steroids: Secondary | ICD-10-CM | POA: Insufficient documentation

## 2019-09-24 DIAGNOSIS — Z79899 Other long term (current) drug therapy: Secondary | ICD-10-CM | POA: Insufficient documentation

## 2019-09-24 LAB — CBC
HCT: 36.8 % (ref 36.0–46.0)
Hemoglobin: 12.4 g/dL (ref 12.0–15.0)
MCH: 32.3 pg (ref 26.0–34.0)
MCHC: 33.7 g/dL (ref 30.0–36.0)
MCV: 95.8 fL (ref 80.0–100.0)
Platelets: 267 10*3/uL (ref 150–400)
RBC: 3.84 MIL/uL — ABNORMAL LOW (ref 3.87–5.11)
RDW: 12.3 % (ref 11.5–15.5)
WBC: 7.1 10*3/uL (ref 4.0–10.5)
nRBC: 0 % (ref 0.0–0.2)

## 2019-09-24 LAB — BASIC METABOLIC PANEL
Anion gap: 10 (ref 5–15)
BUN: 12 mg/dL (ref 6–20)
CO2: 23 mmol/L (ref 22–32)
Calcium: 9 mg/dL (ref 8.9–10.3)
Chloride: 102 mmol/L (ref 98–111)
Creatinine, Ser: 0.77 mg/dL (ref 0.44–1.00)
GFR calc Af Amer: >60 mL/min (ref >60)
GFR calc non Af Amer: >60 mL/min (ref >60)
Glucose, Bld: 100 mg/dL — ABNORMAL HIGH (ref 70–99)
Potassium: 4.4 mmol/L (ref 3.5–5.1)
Sodium: 135 mmol/L (ref 135–145)

## 2019-09-24 LAB — TROPONIN I (HIGH SENSITIVITY)
Troponin I (High Sensitivity): <2 ng/L (ref <18)
Troponin I (High Sensitivity): 4 ng/L (ref <18)

## 2019-09-24 IMAGING — CR DG CHEST 2V
2 series · 2 of 2 positions shown · non-contrast
Comparison: Radiograph [DATE], CT [DATE]

CLINICAL DATA: Intermittent chest pain for 4 days, history of
hypertension

EXAM:
CHEST - 2 VIEW

[w chest pa]
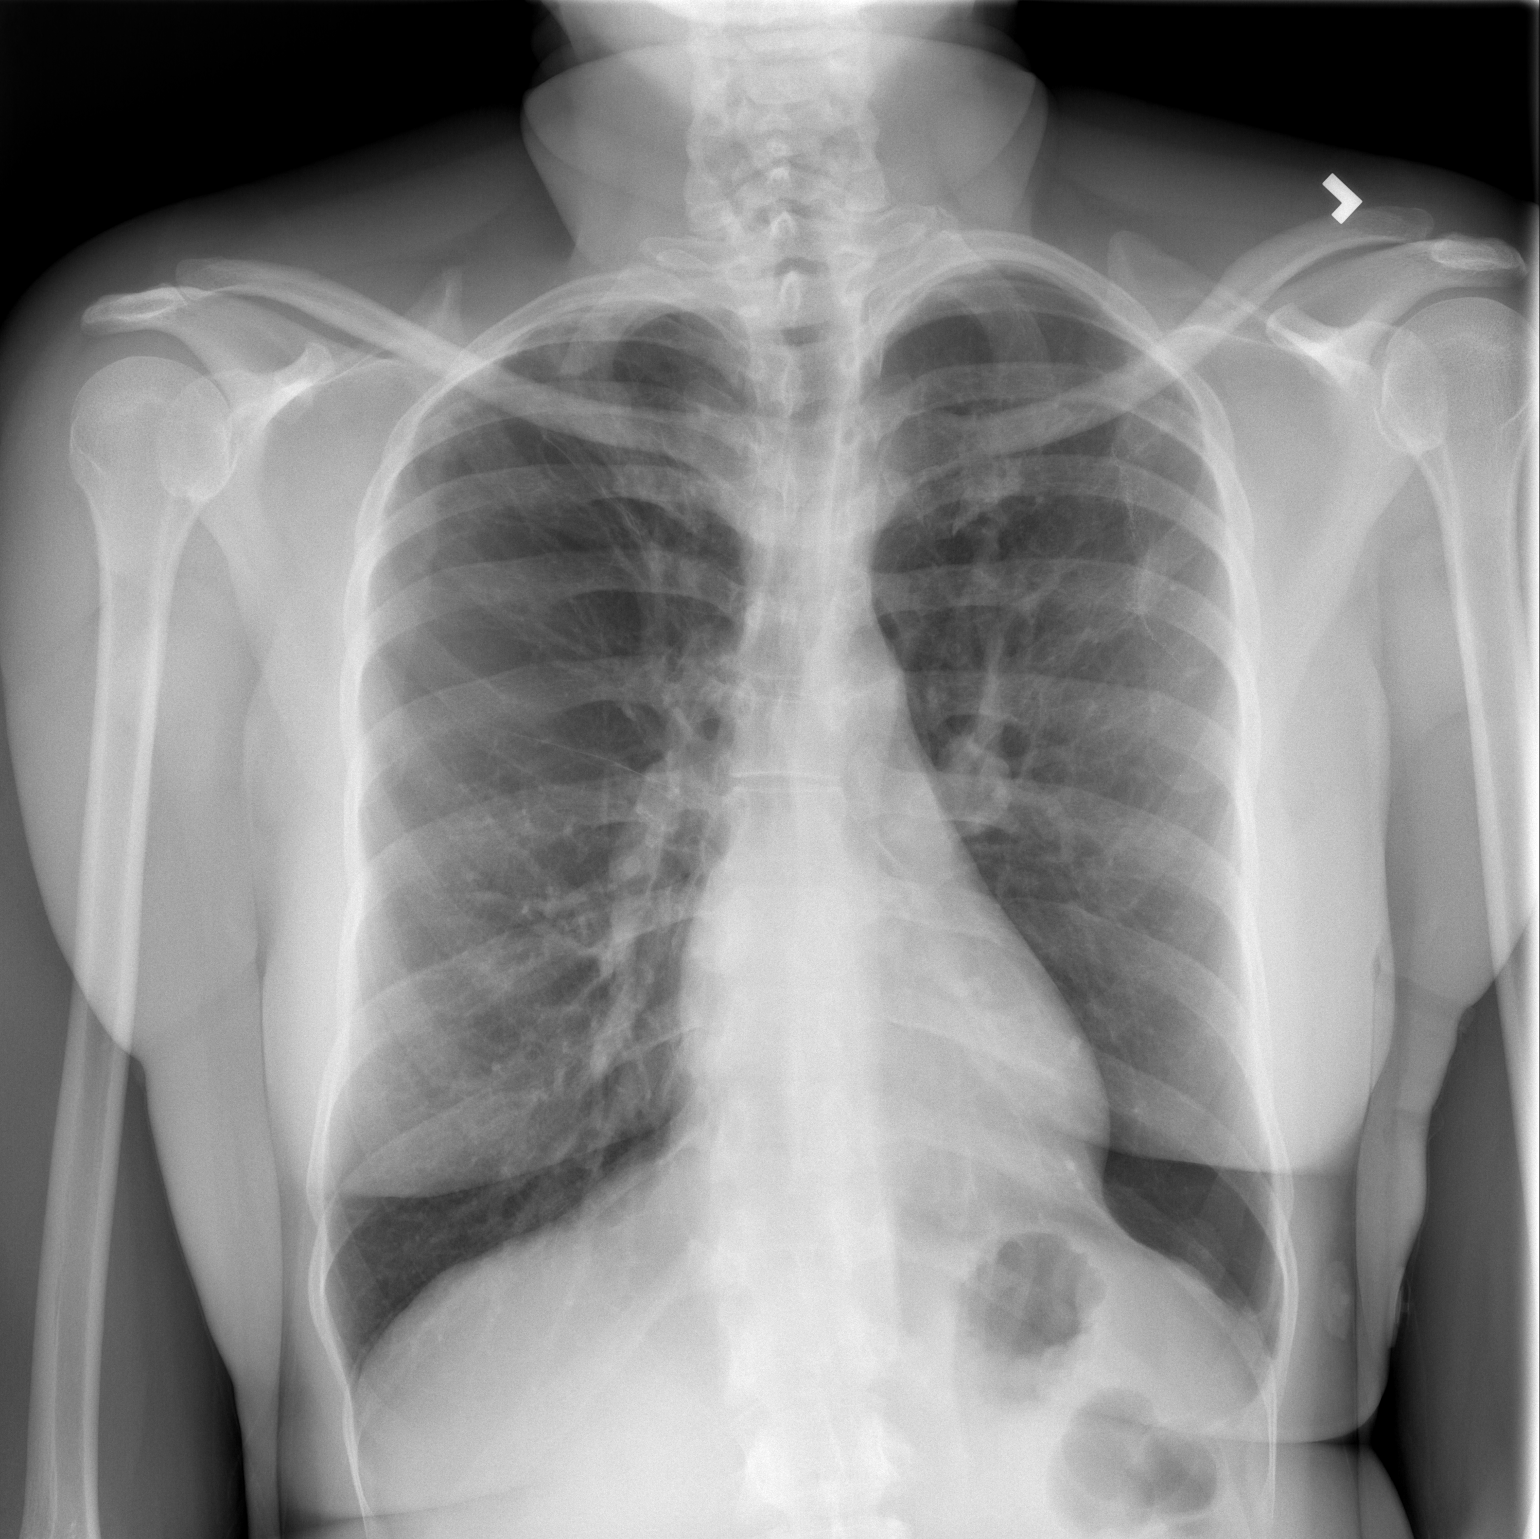

[w chest lat]
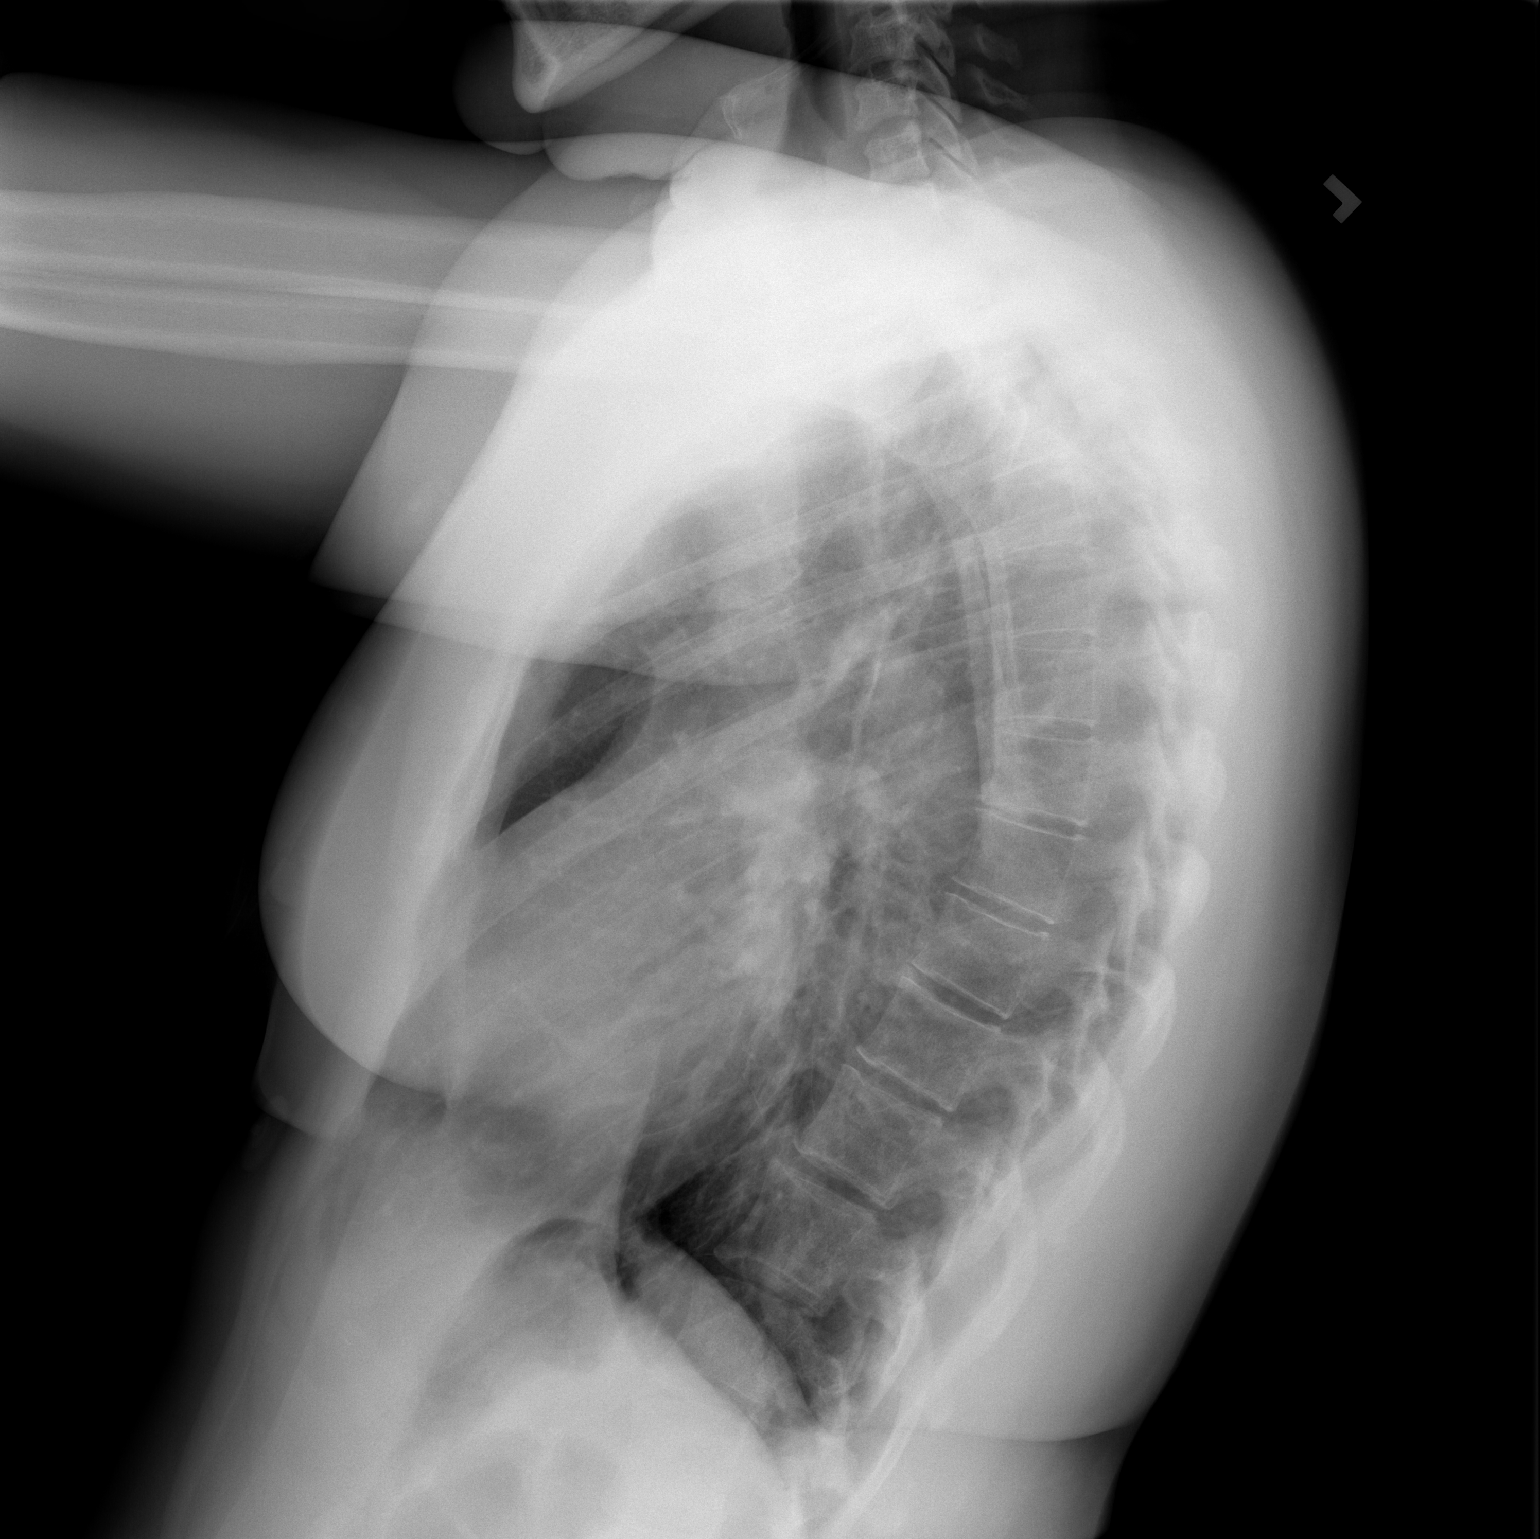

[2 of 2 positions shown; findings below may reference images not displayed]

FINDINGS: Stable scarring and surgical material in the left apex. Additional
scarring in the right apex. Chronic hyperinflation. No
consolidation, features of edema, pneumothorax, or effusion. The
cardiomediastinal contours are unremarkable. No acute osseous or
soft tissue abnormality.
IMPRESSION: 1. No acute cardiopulmonary disease.
2. Stable postsurgical changes in the left apex and right apical
scarring.
3. Mild chronic hyperinflation.

## 2019-09-24 NOTE — ED Triage Notes (Addendum)
Pt c/o intermittent CP x 4 days-denies fever/flu sx-NAD-steady gait

## 2019-09-24 NOTE — ED Notes (Signed)
Pt states that she feels numbness type sensation at left upper arm area.

## 2019-09-24 NOTE — ED Notes (Signed)
Friday night states when she got up to go to the restroom noted to have chest tightness over left side of chest, pain radiated to left arm, pt states pain is on and off, but still having pain. Last night states she began feeling sweaty. Denies any N/V or D.

## 2019-09-24 NOTE — ED Notes (Signed)
BEFAST bedside assessment negative

## 2019-09-24 NOTE — ED Triage Notes (Signed)
Pt presents with complaints of chest pain in the center of her chest that started on Friday night. Reports the pain is pressure like in nature that goes into her back. Reports at night she feels a lot of discomfort that is better when she lays down. Denies any other symptoms like shortness of breath or nausea. Reports some diaphoresis and pain in her left arm.

## 2019-09-24 NOTE — ED Notes (Signed)
ED Provider at bedside. 

## 2019-09-24 NOTE — ED Notes (Signed)
Patient is being discharged from the Urgent Care and sent to the Emergency Department via pov. Per Cathlean Sauer, patient is in need of higher level of care due to full cardiac workup. Patient is aware and verbalizes understanding of plan of care.  Vitals:   09/24/19 1908  BP: 117/76  Pulse: 66  Resp: 19  Temp: 98.3 F (36.8 C)  SpO2: 96%

## 2019-09-25 ENCOUNTER — Other Ambulatory Visit: Payer: Self-pay | Admitting: Internal Medicine

## 2019-09-25 ENCOUNTER — Encounter (HOSPITAL_BASED_OUTPATIENT_CLINIC_OR_DEPARTMENT_OTHER): Payer: Self-pay | Admitting: Emergency Medicine

## 2019-09-25 MED ORDER — SUCRALFATE 1 GM/10ML PO SUSP: 1.0000 g | Freq: Three times a day (TID) | ORAL | 0 refills | Status: DC

## 2019-09-25 MED ORDER — LIDOCAINE VISCOUS HCL 2 % MT SOLN
OROMUCOSAL | Status: AC
  Filled 2019-09-25: qty 15

## 2019-09-25 MED ORDER — ALUM & MAG HYDROXIDE-SIMETH 200-200-20 MG/5ML PO SUSP
ORAL | Status: AC
  Filled 2019-09-25: qty 30

## 2019-09-25 MED ORDER — ALUM & MAG HYDROXIDE-SIMETH 200-200-20 MG/5ML PO SUSP
30.0000 mL | Freq: Once | ORAL | Status: AC
  Administered 2019-09-25: 30 mL via ORAL

## 2019-09-25 MED ORDER — LIDOCAINE VISCOUS HCL 2 % MT SOLN
15.0000 mL | Freq: Once | OROMUCOSAL | Status: AC
  Administered 2019-09-25: 15 mL via OROMUCOSAL

## 2019-09-25 MED ORDER — OMEPRAZOLE 20 MG PO CPDR: 20.0000 mg | DELAYED_RELEASE_CAPSULE | Freq: Every day | ORAL | 0 refills | Status: DC

## 2019-09-25 NOTE — Telephone Encounter (Signed)
Please refill as per office routine med refill policy (all routine meds refilled for 3 mo or monthly per pt preference up to one year from last visit, then month to month grace period for 3 mo, then further med refills will have to be denied)  

## 2019-09-25 NOTE — ED Provider Notes (Signed)
[facility] EMERGENCY DEPARTMENT Provider Note   CSN: [id number] Arrival date & time: 09/24/19  1951     History Chief Complaint  Patient presents with  . Chest Pain    Hannah Barron is a 49 y.o. female.  The history is provided by the patient.  Chest Pain Pain location:  L chest Pain quality: dull   Pain radiates to:  Does not radiate Pain severity:  Moderate Onset quality:  Gradual Duration:  5 days Timing:  Constant Progression:  Waxing and waning (worse at bedtime ) Chronicity:  New Context: at rest   Relieved by:  Nothing Worsened by:  Nothing Ineffective treatments:  None tried Associated symptoms: no abdominal pain, no anxiety, no cough, no diaphoresis, no headache, no lower extremity edema, no palpitations and no shortness of breath   Risk factors: no aortic disease, no coronary artery disease, not pregnant, no prior DVT/PE and no surgery        Past Medical History:  Diagnosis Date  . Anemia   . Bronchiectasis ([place])   . COPD (chronic obstructive pulmonary disease) ([place])   . GERD (gastroesophageal reflux disease)   . Migraines   . Paroxysmal SVT (supraventricular tachycardia) ([place])    a. diagnosed in 11/2015.  . Right leg numbness     Patient Active Problem List   Diagnosis Date Noted  . Urinary frequency 06/29/2019  . Acute midline low back pain without sciatica 06/29/2019  . Vitamin D deficiency 10/01/2018  . HLD (hyperlipidemia) 10/01/2018  . Hyperglycemia 10/01/2018  . Right cervical radiculopathy 10/01/2018  . Trapezius muscle strain, right, initial encounter 10/01/2018  . Right carpal tunnel syndrome 10/01/2018  . Cerumen impaction 09/07/2017  . Acne vulgaris 12/28/2016  . Bronchiectasis with acute exacerbation ([place]) 12/22/2016  . Medicare annual wellness visit, subsequent 04/28/2016  . Preventative health care 04/28/2016  . Lung nodules 03/16/2016  . Palpitations   . GERD (gastroesophageal reflux disease) 07/30/2015  .  Bronchiectasis without acute exacerbation ([place]) 07/08/2015  . COPD (chronic obstructive pulmonary disease) ([place]) 06/04/2015  . Fatigue 06/04/2015    Past Surgical History:  Procedure Laterality Date  . CESAREAN SECTION    . CYSTOSCOPY N/A 04/05/2016   Procedure: CYSTOSCOPY;  Surgeon: Lavonia Drafts, MD;  Location: [facility];  Service: Gynecology;  Laterality: N/A;  . LAPAROSCOPIC VAGINAL HYSTERECTOMY WITH SALPINGECTOMY Bilateral 04/05/2016   Procedure: LAPAROSCOPIC ASSISTED VAGINAL HYSTERECTOMY WITH SALPINGECTOMY;  Surgeon: Lavonia Drafts, MD;  Location: [facility];  Service: Gynecology;  Laterality: Bilateral;  . LUNG BIOPSY    . VIDEO BRONCHOSCOPY Bilateral 03/29/2016   Procedure: VIDEO BRONCHOSCOPY WITHOUT FLUORO;  Surgeon: Marshell Garfinkel, MD;  Location: [facility];  Service: Cardiopulmonary;  Laterality: Bilateral;     OB History    Gravida  1   Para  1   Term  1   Preterm      AB      Living  1     SAB      TAB      Ectopic      Multiple      Live Births  1           Family History  Problem Relation Age of Onset  . Healthy Mother   . Healthy Father     Social History   Tobacco Use  . Smoking status: Never Smoker  . Smokeless tobacco: Never Used  Vaping Use  . Vaping Use: Never used  Substance Use Topics  . Alcohol use: No  Alcohol/week: 0.0 standard drinks  . Drug use: No    Home Medications Prior to Admission medications   Medication Sig Start Date End Date Taking? Authorizing Provider  albuterol (PROVENTIL HFA;VENTOLIN HFA) 108 (90 Base) MCG/ACT inhaler INHALE 2 PUFFS INTO THE LUNGS EVERY 6 HOURS AS NEEDED FOR WHEEZING OR SHORTNESS OF BREATH 01/23/18   Lance Sell, NP  atorvastatin (LIPITOR) 20 MG tablet Take 1 tablet (20 mg total) by mouth daily. 10/01/18 10/01/19  Biagio Borg, MD  budesonide-formoterol [facility]) 160-4.5 MCG/ACT inhaler Inhale 2 puffs into the lungs 2 (two) times daily. 12/20/17   Mannam, Hart Robinsons, MD    CLARAVIS 40 MG capsule Take 2 capsules by mouth daily. 02/27/18   [provider]  cyclobenzaprine (FLEXERIL) 5 MG tablet Take 1 tablet (5 mg total) by mouth 3 (three) times daily as needed for muscle spasms. 06/28/19   Biagio Borg, MD  diclofenac (CATAFLAM) 50 MG tablet Take 50 mg by mouth 3 (three) times daily as needed. 03/30/19   [provider]  dicyclomine (BENTYL) 20 MG tablet TAKE 1 TABLET (20 MG TOTAL) BY MOUTH EVERY 6 (SIX) HOURS. 09/14/19   Biagio Borg, MD  metoprolol succinate (TOPROL-XL) 50 MG 24 hr tablet TAKE 1 TABLET IN MORNING & 1/2 TABLET IN EVENING *TAKE 1/2 TAB AS NEEDED FOR PALPITATIONS/RAPID HR* 03/08/19   Martinique, Peter M, MD  omeprazole (PRILOSEC) 40 MG capsule TAKE 1 CAPSULE BY MOUTH EVERY DAY 10/01/18   Biagio Borg, MD  Respiratory Therapy Supplies (FLUTTER) DEVI Use as directed. 03/16/16   Parrett, Fonnie Mu, NP  RESTASIS 0.05 % ophthalmic emulsion Instill 1 drop into both eyes two times a day 04/25/15   [provider]  traMADol (ULTRAM) 50 MG tablet Take 1 tablet (50 mg total) by mouth every 6 (six) hours as needed. 06/28/19   Biagio Borg, MD    Allergies    Patient has no known allergies.  Review of Systems   Review of Systems  Constitutional: Negative for diaphoresis.  HENT: Negative for congestion.   Eyes: Negative for visual disturbance.  Respiratory: Negative for cough and shortness of breath.   Cardiovascular: Positive for chest pain. Negative for palpitations and leg swelling.  Gastrointestinal: Negative for abdominal pain.  Genitourinary: Negative for difficulty urinating.  Musculoskeletal: Negative for arthralgias.  Skin: Negative for rash.  Neurological: Negative for headaches.  Psychiatric/Behavioral: Negative for agitation.  All other systems reviewed and are negative.   Physical Exam Updated Vital Signs BP (!) 112/44 (BP Location: Left Arm)   Pulse 63   Temp 98.6 F (37 C) (Oral)   Resp 18   Ht 5\' 7"  (1.702 m)   Wt  73.5 kg   LMP 03/31/2016   SpO2 99%   BMI 25.37 kg/m   Physical Exam Vitals and nursing note reviewed.  Constitutional:      General: She is not in acute distress.    Appearance: Normal appearance.  HENT:     Head: Normocephalic and atraumatic.     Nose: Nose normal.  Eyes:     Conjunctiva/sclera: Conjunctivae normal.     Pupils: Pupils are equal, round, and reactive to light.  Cardiovascular:     Rate and Rhythm: Normal rate and regular rhythm.     Pulses: Normal pulses.     Heart sounds: Normal heart sounds.  Pulmonary:     Effort: Pulmonary effort is normal.     Breath sounds: Normal breath sounds.  Abdominal:  General: Abdomen is flat. Bowel sounds are normal.     Palpations: Abdomen is soft.     Tenderness: There is no abdominal tenderness. There is no guarding.  Musculoskeletal:        General: Normal range of motion.     Cervical back: Normal range of motion and neck supple.  Skin:    General: Skin is warm and dry.     Capillary Refill: Capillary refill takes less than 2 seconds.  Neurological:     General: No focal deficit present.     Mental Status: She is alert and oriented to person, place, and time.  Psychiatric:        Mood and Affect: Mood normal.        Behavior: Behavior normal.     ED Results / Procedures / Treatments   Labs (all labs ordered are listed, but only abnormal results are displayed) Results for orders placed or performed during the hospital encounter of 68/12/75  Basic metabolic panel  Result Value Ref Range   Sodium 135 135 - 145 mmol/L   Potassium 4.4 3.5 - 5.1 mmol/L   Chloride 102 98 - 111 mmol/L   CO2 23 22 - 32 mmol/L   Glucose, Bld 100 (H) 70 - 99 mg/dL   BUN 12 6 - 20 mg/dL   Creatinine, Ser 0.77 0.44 - 1.00 mg/dL   Calcium 9.0 8.9 - 10.3 mg/dL   GFR calc non Af Amer >60 >60 mL/min   GFR calc Af Amer >60 >60 mL/min   Anion gap 10 5 - 15  CBC  Result Value Ref Range   WBC 7.1 4.0 - 10.5 K/uL   RBC 3.84 (L) 3.87 -  5.11 MIL/uL   Hemoglobin 12.4 12.0 - 15.0 g/dL   HCT 36.8 36 - 46 %   MCV 95.8 80.0 - 100.0 fL   MCH 32.3 26.0 - 34.0 pg   MCHC 33.7 30.0 - 36.0 g/dL   RDW 12.3 11.5 - 15.5 %   Platelets 267 150 - 400 K/uL   nRBC 0.0 0.0 - 0.2 %  Troponin I (High Sensitivity)  Result Value Ref Range   Troponin I (High Sensitivity) <2 <18 ng/L  Troponin I (High Sensitivity)  Result Value Ref Range   Troponin I (High Sensitivity) 4 <18 ng/L   DG Chest 2 View  Result Date: 09/24/2019 CLINICAL DATA:  Intermittent chest pain for 4 days, history of hypertension EXAM: CHEST - 2 VIEW COMPARISON:  Radiograph 05/21/2019, CT 12/23/2016 FINDINGS: Stable scarring and surgical material in the left apex. Additional scarring in the right apex. Chronic hyperinflation. No consolidation, features of edema, pneumothorax, or effusion. The cardiomediastinal contours are unremarkable. No acute osseous or soft tissue abnormality. IMPRESSION: 1. No acute cardiopulmonary disease. 2. Stable postsurgical changes in the left apex and right apical scarring. 3. Mild chronic hyperinflation. Electronically Signed   By: Lovena Le M.D.   On: 09/24/2019 20:55    EKG  Date: 09/25/2019  Rate: 71  Rhythm: normal sinus rhythm with sinus arrhythmia   QRS Axis: normal  Intervals: normal  ST/T Wave abnormalities: normal  Conduction Disutrbances: none  Narrative Interpretation: unremarkable   ; Radiology DG Chest 2 View  Result Date: 09/24/2019 CLINICAL DATA:  Intermittent chest pain for 4 days, history of hypertension EXAM: CHEST - 2 VIEW COMPARISON:  Radiograph 05/21/2019, CT 12/23/2016 FINDINGS: Stable scarring and surgical material in the left apex. Additional scarring in the right apex. Chronic hyperinflation. No consolidation, features  of edema, pneumothorax, or effusion. The cardiomediastinal contours are unremarkable. No acute osseous or soft tissue abnormality. IMPRESSION: 1. No acute cardiopulmonary disease. 2. Stable  postsurgical changes in the left apex and right apical scarring. 3. Mild chronic hyperinflation. Electronically Signed   By: Lovena Le M.D.   On: 09/24/2019 20:55    Procedures Procedures (including critical care time)  Medications Ordered in ED Medications - No data to display  ED Course  I have reviewed the triage vital signs and the nursing notes.  Pertinent labs & imaging results that were available during my care of the patient were reviewed by me and considered in my medical decision making (see chart for details).    Ruled out for MI in the ED.  HEART score is 1 low risk for MACE.  PERC negative wells 0, highly doubt PE in this low risk patient.  Symptoms are consistent with GERD.  Will start GERD friendly diet and PPI and refer to GI.    Hannah Barron was evaluated in Emergency Department on 09/25/2019 for the symptoms described in the history of present illness. She was evaluated in the context of the global COVID-19 pandemic, which necessitated consideration that the patient might be at risk for infection with the SARS-CoV-2 virus that causes COVID-19. Institutional protocols and algorithms that pertain to the evaluation of patients at risk for COVID-19 are in a state of rapid change based on information released by regulatory bodies including the CDC and federal and state organizations. These policies and algorithms were followed during the patient's care in the ED.  Final Clinical Impression(s) / ED Diagnoses Return for intractable cough, coughing up blood,fevers >100.4 unrelieved by medication, shortness of breath, intractable vomiting, chest pain, shortness of breath, weakness,numbness, changes in speech, facial asymmetry,abdominal pain, passing out,Inability to tolerate liquids or food, cough, altered mental status or any concerns. No signs of systemic illness or infection. The patient is nontoxic-appearing on exam and vital signs are within normal limits.   I have  reviewed the triage vital signs and the nursing notes. Pertinent labs &imaging results that were available during my care of the patient were reviewed by me and considered in my medical decision making (see chart for details).After history, exam, and medical workup I feel the patient has beenappropriately medically screened and is safe for discharge home. Pertinent diagnoses were discussed with the patient. Patient was given return precautions.   Jewel Mcafee, MD 09/25/19 0005

## 2019-09-26 ENCOUNTER — Ambulatory Visit: Payer: Medicare HMO | Admitting: General Practice

## 2019-09-26 NOTE — Progress Notes (Signed)
Cardiology Clinic Note   Patient Name: Hannah Barron Date of Encounter: 09/27/2019  Primary Care Provider:  Biagio Borg, MD Primary Cardiologist:  Peter Martinique, MD  Patient Profile    Hannah Barron 49 year old female presents to the clinic today for follow-up of her chest pain.  Past Medical History    Past Medical History:  Diagnosis Date  . Anemia   . Bronchiectasis ([place])   . COPD (chronic obstructive pulmonary disease) ([place])   . GERD (gastroesophageal reflux disease)   . Migraines   . Paroxysmal SVT (supraventricular tachycardia) ([place])    a. diagnosed in 11/2015.  . Right leg numbness    Past Surgical History:  Procedure Laterality Date  . CESAREAN SECTION    . CYSTOSCOPY N/A 04/05/2016   Procedure: CYSTOSCOPY;  Surgeon: Lavonia Drafts, MD;  Location: [facility];  Service: Gynecology;  Laterality: N/A;  . LAPAROSCOPIC VAGINAL HYSTERECTOMY WITH SALPINGECTOMY Bilateral 04/05/2016   Procedure: LAPAROSCOPIC ASSISTED VAGINAL HYSTERECTOMY WITH SALPINGECTOMY;  Surgeon: Lavonia Drafts, MD;  Location: [facility];  Service: Gynecology;  Laterality: Bilateral;  . LUNG BIOPSY    . VIDEO BRONCHOSCOPY Bilateral 03/29/2016   Procedure: VIDEO BRONCHOSCOPY WITHOUT FLUORO;  Surgeon: Marshell Garfinkel, MD;  Location: [facility];  Service: Cardiopulmonary;  Laterality: Bilateral;    Allergies  No Known Allergies  History of Present Illness    Hannah Barron has a PMH of anemia, bronchiectasis, COPD, GERD, migraines, paroxysmal SVT, and right leg numbness.  She presented to the emergency department on 09/25/2019 for an evaluation of her chest pain.  She reported as dull nonradiating, moderate, gradual in onset which comes and goes.  She indicated that been present for 5 days.  She noted that it was worse at bedtime and that nothing had relieved her discomfort.    She presents the clinic today for further evaluation and states she has stomach type pain underneath her left breast nightly for  the last 5 nights.  She indicates that is worse and describes it as a dull burning pain with laying down.  She does not have any chest pain or chest discomfort with increased physical activity.  She has had no emesis or increased fatigue.  She indicates that she has not yet picked up her Carafate but takes her omeprazole daily.  She has previously seen a gastroenterologist and indicates she is going to make an appointment with them after leaving our office today.  I will give her information on GERD diet and instructions for managing her reflux.  We will have her follow-up with Dr. Martinique in 3 months.  Today she denies chest pain, shortness of breath, lower extremity edema, fatigue, palpitations, melena, hematuria, hemoptysis, diaphoresis, weakness, presyncope, syncope, orthopnea, and PND.     Home Medications    Prior to Admission medications   Medication Sig Start Date End Date Taking? Authorizing Provider  albuterol (PROVENTIL HFA;VENTOLIN HFA) 108 (90 Base) MCG/ACT inhaler INHALE 2 PUFFS INTO THE LUNGS EVERY 6 HOURS AS NEEDED FOR WHEEZING OR SHORTNESS OF BREATH 01/23/18   Lance Sell, NP  atorvastatin (LIPITOR) 20 MG tablet Take 1 tablet (20 mg total) by mouth daily. 10/01/18 10/01/19  Biagio Borg, MD  budesonide-formoterol [facility]) 160-4.5 MCG/ACT inhaler Inhale 2 puffs into the lungs 2 (two) times daily. 12/20/17   Mannam, Hart Robinsons, MD  CLARAVIS 40 MG capsule Take 2 capsules by mouth daily. 02/27/18   [provider]  cyclobenzaprine (FLEXERIL) 5 MG tablet Take 1 tablet (5 mg total) by mouth 3 (  three) times daily as needed for muscle spasms. 06/28/19   Biagio Borg, MD  diclofenac (CATAFLAM) 50 MG tablet Take 50 mg by mouth 3 (three) times daily as needed. 03/30/19   [provider]  dicyclomine (BENTYL) 20 MG tablet TAKE 1 TABLET (20 MG TOTAL) BY MOUTH EVERY 6 (SIX) HOURS. 09/14/19   Biagio Borg, MD  metoprolol succinate (TOPROL-XL) 50 MG 24 hr tablet TAKE 1 TABLET  IN MORNING & 1/2 TABLET IN EVENING *TAKE 1/2 TAB AS NEEDED FOR PALPITATIONS/RAPID HR* 03/08/19   Martinique, Peter M, MD  omeprazole (PRILOSEC) 20 MG capsule Take 1 capsule (20 mg total) by mouth daily. 09/25/19   Palumbo, April, MD  omeprazole (PRILOSEC) 40 MG capsule TAKE 1 CAPSULE BY MOUTH EVERY DAY 10/01/18   Biagio Borg, MD  Respiratory Therapy Supplies (FLUTTER) DEVI Use as directed. 03/16/16   Parrett, Fonnie Mu, NP  RESTASIS 0.05 % ophthalmic emulsion Instill 1 drop into both eyes two times a day 04/25/15   [provider]  sucralfate (CARAFATE) 1 GM/10ML suspension Take 10 mLs (1 g total) by mouth 4 (four) times daily -  with meals and at bedtime. 09/25/19   Palumbo, April, MD  traMADol (ULTRAM) 50 MG tablet Take 1 tablet (50 mg total) by mouth every 6 (six) hours as needed. 06/28/19   Biagio Borg, MD    Family History    Family History  Problem Relation Age of Onset  . Healthy Mother   . Healthy Father    She indicated that her mother is alive. She indicated that her father is alive. She indicated that her maternal grandmother is deceased. She indicated that her maternal grandfather is deceased. She indicated that her paternal grandmother is deceased. She indicated that her paternal grandfather is deceased.  Social History    Social History   Socioeconomic History  . Marital status: Married    Spouse name: Not on file  . Number of children: 1  . Years of education: 16  . Highest education level: Not on file  Occupational History  . Occupation: disability  Tobacco Use  . Smoking status: Never Smoker  . Smokeless tobacco: Never Used  Vaping Use  . Vaping Use: Never used  Substance and Sexual Activity  . Alcohol use: No    Alcohol/week: 0.0 standard drinks  . Drug use: No  . Sexual activity: Yes    Birth control/protection: Surgical  Other Topics Concern  . Not on file  Social History Narrative   Fun: Watch TV   Social Determinants of Health   Financial Resource  Strain:   . Difficulty of Paying Living Expenses: Not on file  Food Insecurity:   . Worried About Charity fundraiser in the Last Year: Not on file  . Ran Out of Food in the Last Year: Not on file  Transportation Needs:   . Lack of Transportation (Medical): Not on file  . Lack of Transportation (Non-Medical): Not on file  Physical Activity:   . Days of Exercise per Week: Not on file  . Minutes of Exercise per Session: Not on file  Stress:   . Feeling of Stress : Not on file  Social Connections:   . Frequency of Communication with Friends and Family: Not on file  . Frequency of Social Gatherings with Friends and Family: Not on file  . Attends Religious Services: Not on file  . Active Member of Clubs or Organizations: Not on file  . Attends  Club or Organization Meetings: Not on file  . Marital Status: Not on file  Intimate Partner Violence:   . Fear of Current or Ex-Partner: Not on file  . Emotionally Abused: Not on file  . Physically Abused: Not on file  . Sexually Abused: Not on file     Review of Systems    General:  No chills, fever, night sweats or weight changes.  Cardiovascular:  No chest pain, dyspnea on exertion, edema, orthopnea, palpitations, paroxysmal nocturnal dyspnea. Dermatological: No rash, lesions/masses Respiratory: No cough, dyspnea Urologic: No hematuria, dysuria Abdominal:   No nausea, vomiting, diarrhea, bright red blood per rectum, melena, or hematemesis Neurologic:  No visual changes, wkns, changes in mental status. All other systems reviewed and are otherwise negative except as noted above.  Physical Exam    VS:  BP 100/60   Pulse 90   Ht 5\' 7"  (1.702 m)   Wt 163 lb 3.2 oz (74 kg)   LMP 03/31/2016   BMI 25.56 kg/m  , BMI Body mass index is 25.56 kg/m. GEN: Well nourished, well developed, in no acute distress. HEENT: normal. Neck: Supple, no JVD, carotid bruits, or masses. Cardiac: RRR, no murmurs, rubs, or gallops. No clubbing, cyanosis,  edema.  Radials/DP/PT 2+ and equal bilaterally.  Respiratory:  Respirations regular and unlabored, clear to auscultation bilaterally. GI: Soft, nontender, nondistended, BS + x 4. MS: no deformity or atrophy. Skin: warm and dry, no rash. Neuro:  Strength and sensation are intact. Psych: Normal affect.  Accessory Clinical Findings    Recent Labs: 04/30/2019: ALT 20; TSH 0.77 09/24/2019: BUN 12; Creatinine, Ser 0.77; Hemoglobin 12.4; Platelets 267; Potassium 4.4; Sodium 135   Recent Lipid Panel    Component Value Date/Time   CHOL 165 04/30/2019 1104   TRIG 57.0 04/30/2019 1104   HDL 70.20 04/30/2019 1104   CHOLHDL 2 04/30/2019 1104   VLDL 11.4 04/30/2019 1104   LDLCALC 84 04/30/2019 1104    ECG personally reviewed by me today- None today.   EKG 09/26/2019 Normal sinus rhythm left atrial enlargement 70 bpm no ST or T wave deviation  Echocardiogram 11/27/2018 IMPRESSIONS    1. Left ventricular ejection fraction, by visual estimation, is 65 to  70%. The left ventricle has hyperdynamic function. There is mildly  increased left ventricular hypertrophy. Normal diastolic function.  2. The average left ventricular global longitudinal strain is -20.7 %.  3. Global right ventricle has normal systolic function.The right  ventricular size is normal. No increase in right ventricular wall  thickness.  4. Left atrial size was normal.  5. Right atrial size was normal.  6. The mitral valve is normal in structure. No evidence of mitral valve  regurgitation.  7. The tricuspid valve is normal in structure. Tricuspid valve  regurgitation is not demonstrated.  8. The aortic valve is normal in structure. Aortic valve regurgitation is  not visualized. No evidence of aortic valve sclerosis or stenosis.  9. The pulmonic valve was not well visualized. Pulmonic valve  regurgitation is not visualized.  10. The inferior vena cava is normal in size with greater than 50%  respiratory  variability, suggesting right atrial pressure of 3 mmHg.  Assessment & Plan   1.  Atypical chest pain-presented to the emergency department 09/25/2019.  Indicated that she had been having dull constant chest pain for the last 5 days.  She had no radiating symptoms and noticed that the pain was worse at night when she would lay down.  No  acute EKG changes.  Troponins flat at less than 2 and 4.  Appeared to be GI nature.  We will have her follow-up with GI for further evaluation. Continue Prilosec, Carafate  Gerd-burning sensation, epigastric pain.  No pain with physical activity.  Dull aching in the evening with laying down. Continue Prilosec, Carafate Gerd diet and handout Increase physical activity as tolerated Follow-up with GI  Essential hypertension-BP today 100/60.  Well-controlled at home Continue metoprolol Heart healthy low-sodium diet-salty 6 given Increase physical activity as tolerated   Hyperlipidemia-LDL 84 on 04/30/2019 Continue atorvastatin Heart healthy low-sodium diet-salty 6 given Increase physical activity as tolerated  Disposition: Follow-up with Dr. Martinique or me in 3 months.  Hannah Ng. Daelan Gatt NP-C    09/27/2019, 2:13 PM [facility] [place] [address] Office [phone] Fax [phone]  Notice: This dictation was prepared with Dragon dictation along with smaller phrase technology. Any transcriptional errors that result from this process are unintentional and may not be corrected upon review.

## 2019-09-27 ENCOUNTER — Other Ambulatory Visit: Payer: Self-pay

## 2019-09-27 ENCOUNTER — Encounter: Payer: Self-pay | Admitting: General Practice

## 2019-09-27 ENCOUNTER — Ambulatory Visit: Payer: Medicare HMO | Admitting: General Practice

## 2019-09-27 VITALS — BP 100/60 | HR 90 | Ht 67.0 in | Wt 163.2 lb

## 2019-09-27 DIAGNOSIS — R0789 Other chest pain: Secondary | ICD-10-CM | POA: Diagnosis not present

## 2019-09-27 DIAGNOSIS — K2100 Gastro-esophageal reflux disease with esophagitis, without bleeding: Secondary | ICD-10-CM | POA: Diagnosis not present

## 2019-09-27 DIAGNOSIS — I10 Essential (primary) hypertension: Secondary | ICD-10-CM | POA: Diagnosis not present

## 2019-09-27 DIAGNOSIS — E785 Hyperlipidemia, unspecified: Secondary | ICD-10-CM | POA: Diagnosis not present

## 2019-09-27 NOTE — Patient Instructions (Addendum)
Medication Instructions:  MAKE SURE TO PICK UP AND TAKE YOUR CARAFATE *If you need a refill on your cardiac medications before your next appointment, please call your pharmacy*  Lab Work:   Testing/Procedures:  NONE    NONE  Special Instructions PLEASE READ AND FOLLOW GERD DIET-ATTACHED  Raise the head of your bed, No tomatoes, citurs, do not wear tight clothes, no citrus foods-additional attached  PLEASE INCREASE PHYSICAL ACTIVITY AS TOLERATED  Follow-Up: Your next appointment:  3 month(s) In Person with Hannah Martinique, MD -[place], FNP-C  At [facility], you and your health needs are our priority.  As part of our continuing mission to provide you with exceptional heart care, we have created designated Provider Care Teams.  These Care Teams include your primary Cardiologist (physician) and Advanced Practice Providers (APPs -  Physician Assistants and Nurse Practitioners) who all work together to provide you with the care you need, when you need it.  We recommend signing up for the patient portal called "MyChart".  Sign up information is provided on this After Visit Summary.  MyChart is used to connect with patients for Virtual Visits (Telemedicine).  Patients are able to view lab/test results, encounter notes, upcoming appointments, etc.  Non-urgent messages can be sent to your provider as well.   To learn more about what you can do with MyChart, go to [email or web address].      Food Choices for Gastroesophageal Reflux Disease, Adult When you have gastroesophageal reflux disease (GERD), the foods you eat and your eating habits are very important. Choosing the right foods can help ease your discomfort. Think about working with a nutrition specialist (dietitian) to help you make good choices. What are tips for following this plan?  Meals  Choose healthy foods that are low in fat, such as fruits, vegetables, whole grains, low-fat dairy products, and lean meat, fish, and  poultry.  Eat small meals often instead of 3 large meals a day. Eat your meals slowly, and in a place where you are relaxed. Avoid bending over or lying down until 2-3 hours after eating.  Avoid eating meals 2-3 hours before bed.  Avoid drinking a lot of liquid with meals.  Cook foods using methods other than frying. Bake, grill, or broil food instead.  Avoid or limit: ? Chocolate. ? Peppermint or spearmint. ? Alcohol. ? Pepper. ? Black and decaffeinated coffee. ? Black and decaffeinated tea. ? Bubbly (carbonated) soft drinks. ? Caffeinated energy drinks and soft drinks.  Limit high-fat foods such as: ? Fatty meat or fried foods. ? Whole milk, cream, butter, or ice cream. ? Nuts and nut butters. ? Pastries, donuts, and sweets made with butter or shortening.  Avoid foods that cause symptoms. These foods may be different for everyone. Common foods that cause symptoms include: ? Tomatoes. ? Oranges, lemons, and limes. ? Peppers. ? Spicy food. ? Onions and garlic. ? Vinegar. Lifestyle  Maintain a healthy weight. Ask your doctor what weight is healthy for you. If you need to lose weight, work with your doctor to do so safely.  Exercise for at least 30 minutes for 5 or more days each week, or as told by your doctor.  Wear loose-fitting clothes.  Do not smoke. If you need help quitting, ask your doctor.  Sleep with the head of your bed higher than your feet. Use a wedge under the mattress or blocks under the bed frame to raise the head of the bed. Summary  When you have gastroesophageal  reflux disease (GERD), food and lifestyle choices are very important in easing your symptoms.  Eat small meals often instead of 3 large meals a day. Eat your meals slowly, and in a place where you are relaxed.  Limit high-fat foods such as fatty meat or fried foods.  Avoid bending over or lying down until 2-3 hours after eating.  Avoid peppermint and spearmint, caffeine, alcohol, and  chocolate. This information is not intended to replace advice given to you by your health care provider. Make sure you discuss any questions you have with your health care provider.

## 2019-09-30 ENCOUNTER — Ambulatory Visit: Payer: Medicare HMO | Admitting: Gastroenterology

## 2019-09-30 ENCOUNTER — Encounter: Payer: Self-pay | Admitting: Gastroenterology

## 2019-09-30 VITALS — BP 110/60 | HR 87 | Ht 67.0 in | Wt 161.8 lb

## 2019-09-30 DIAGNOSIS — R0789 Other chest pain: Secondary | ICD-10-CM | POA: Diagnosis not present

## 2019-09-30 MED ORDER — OXYCODONE-ACETAMINOPHEN 5-325 MG PO TABS: 1.0000 | ORAL_TABLET | Freq: Three times a day (TID) | ORAL | 0 refills | Status: DC | PRN

## 2019-09-30 NOTE — Progress Notes (Signed)
[facility] Progress Note  Chief Complaint: Left upper quadrant pain (reported)  Subjective  History: Functional abdominal pain and bloating, has occurred intermittently for years with work-up in [place] prior to seeing me.  Last visit here May 2019.  Initial improvement with BuSpar but then it lost effect.  Was then treated with dicyclomine and CT scan ordered. ED visit for chest pain 09/24/19, then Cardiology visit 09/27/19 and deemed non-cardiac chest pain, suspected GI in nature. ______________  Faylene Kurtz tells me that 10 days ago she was awoken early morning with the pain in the left side of the chest wall under her breast, and it has been present since then.  It sometimes radiates to the posterior left chest wall or precordial area, it is definitely worse laying on the left side or taking a deep breath.  She is unable to sleep at night because of the pain and is making her increasingly anxious.  She does not have dysphagia, odynophagia, nausea or vomiting, and says it does not feel anything like previous digestive issues.  She is frustrated and upset because she does not feel that this was addressed by other providers.  No known recent Covid exposure  ROS: She has been feeling more anxious since this started because she was unable to sleep and is very concerned about it. Denies cough, sputum production, fever or hemoptysis. No leg swelling.  The patient's Past Medical, Family and Social History were reviewed and are on file in the EMR.  Objective:  Med list reviewed  Current Outpatient Medications:  .  albuterol (PROVENTIL HFA;VENTOLIN HFA) 108 (90 Base) MCG/ACT inhaler, INHALE 2 PUFFS INTO THE LUNGS EVERY 6 HOURS AS NEEDED FOR WHEEZING OR SHORTNESS OF BREATH, Disp: 54 Inhaler, Rfl: 3 .  atorvastatin (LIPITOR) 20 MG tablet, TAKE 1 TABLET BY MOUTH EVERY DAY, Disp: 90 tablet, Rfl: 3 .  budesonide-formoterol (SYMBICORT) 160-4.5 MCG/ACT inhaler, Inhale 2 puffs into the lungs  2 (two) times daily., Disp: 1 Inhaler, Rfl: 5 .  CLARAVIS 40 MG capsule, Take 2 capsules by mouth daily., Disp: , Rfl:  .  cyclobenzaprine (FLEXERIL) 5 MG tablet, Take 1 tablet (5 mg total) by mouth 3 (three) times daily as needed for muscle spasms., Disp: 30 tablet, Rfl: 1 .  diclofenac (CATAFLAM) 50 MG tablet, Take 50 mg by mouth 3 (three) times daily as needed., Disp: , Rfl:  .  dicyclomine (BENTYL) 20 MG tablet, TAKE 1 TABLET (20 MG TOTAL) BY MOUTH EVERY 6 (SIX) HOURS., Disp: 60 tablet, Rfl: 1 .  metoprolol succinate (TOPROL-XL) 50 MG 24 hr tablet, TAKE 1 TABLET IN MORNING & 1/2 TABLET IN EVENING *TAKE 1/2 TAB AS NEEDED FOR PALPITATIONS/RAPID HR*, Disp: 180 tablet, Rfl: 3 .  omeprazole (PRILOSEC) 40 MG capsule, TAKE 1 CAPSULE BY MOUTH EVERY DAY, Disp: 90 capsule, Rfl: 3 .  Respiratory Therapy Supplies (FLUTTER) DEVI, Use as directed., Disp: 1 each, Rfl: 0 .  RESTASIS 0.05 % ophthalmic emulsion, Instill 1 drop into both eyes two times a day, Disp: , Rfl: 2 .  traMADol (ULTRAM) 50 MG tablet, Take 1 tablet (50 mg total) by mouth every 6 (six) hours as needed., Disp: 30 tablet, Rfl: 0 .  oxyCODONE-acetaminophen (PERCOCET) 5-325 MG tablet, Take 1 tablet by mouth every 8 (eight) hours as needed for severe pain., Disp: 6 tablet, Rfl: 0   Vital signs in last 24 hrs: Vitals:   09/30/19 1548  BP: 110/60  Pulse: 87  SpO2: 99%  Physical Exam  She is tearful and upset soon into our visit.  HEENT: sclera anicteric, oral mucosa moist without lesions  Neck: supple, no thyromegaly, JVD or lymphadenopathy  Cardiac: RRR without murmurs, S1S2 heard, no peripheral edema or palpable cords in the calves.  Pulm: clear to auscultation bilaterally, normal RR and effort noted.  She is tender throughout the left lower chest wall from the mid axillary line to the precordium under the breast.  It hurts more laying on that side or with a deep breath during the visit.  Abdomen: soft, no tenderness, with  active bowel sounds. No guarding or palpable hepatosplenomegaly.  Skin; warm and dry, no jaundice or rash.  In particular, no skin changes over the area in question and no evidence of shingles.  Labs:  CBC Latest Ref Rng & Units 09/24/2019 04/30/2019 10/01/2018  WBC 4.0 - 10.5 K/uL 7.1 4.6 5.5  Hemoglobin 12.0 - 15.0 g/dL 12.4 12.4 13.0  Hematocrit 36 - 46 % 36.8 37.2 38.6  Platelets 150 - 400 K/uL 267 261.0 284.0   CMP Latest Ref Rng & Units 09/24/2019 04/30/2019 10/01/2018  Glucose 70 - 99 mg/dL 100(H) 90 87  BUN 6 - 20 mg/dL 12 7 7   Creatinine 0.44 - 1.00 mg/dL 0.77 0.66 0.69  Sodium 135 - 145 mmol/L 135 139 138  Potassium 3.5 - 5.1 mmol/L 4.4 3.8 4.8  Chloride 98 - 111 mmol/L 102 103 103  CO2 22 - 32 mmol/L 23 28 27   Calcium 8.9 - 10.3 mg/dL 9.0 9.5 10.0  Total Protein 6.0 - 8.3 g/dL - 7.3 7.8  Total Bilirubin 0.2 - 1.2 mg/dL - 0.3 0.3  Alkaline Phos 39 - 117 U/L - 64 69  AST 0 - 37 U/L - 19 26  ALT 0 - 35 U/L - 20 28   Negative troponin x 2  ___________________________________________ Radiologic studies:  CLINICAL DATA:  Patient with abdominal pain and bloating, postprandial for 2 months.   EXAM: CT ABDOMEN AND PELVIS WITH CONTRAST   TECHNIQUE: Multidetector CT imaging of the abdomen and pelvis was performed using the standard protocol following bolus administration of intravenous contrast.   CONTRAST:  124mL ISOVUE-300 IOPAMIDOL (ISOVUE-300) INJECTION 61%   COMPARISON:  None.   FINDINGS: Lower chest: Normal heart size. Left lower lobe bronchiectasis. No pleural effusion.   Hepatobiliary: Liver is normal in size and contour. No focal lesion identified. Gallbladder is unremarkable. No intrahepatic or extrahepatic biliary ductal dilatation.   Pancreas: Unremarkable   Spleen: Unremarkable   Adrenals/Urinary Tract: Adrenal glands are normal. Kidneys enhance symmetrically with contrast. No hydronephrosis. Urinary bladder is unremarkable.   Stomach/Bowel:  Normal morphology of the stomach. Small hiatal hernia. No abnormal bowel wall thickening or evidence for bowel obstruction.   Vascular/Lymphatic: Normal caliber abdominal aorta. No retroperitoneal lymphadenopathy.   Reproductive: Status post hysterectomy. Adnexal structures are unremarkable.   Other: None.   Musculoskeletal: No aggressive or acute appearing osseous lesions.   IMPRESSION: No acute process within the abdomen or pelvis.     Electronically Signed   By: Lovey Newcomer M.D.   On: 06/15/2017 16:05 --------------------------------------------- CLINICAL DATA:  Intermittent chest pain for 4 days, history of hypertension   EXAM: CHEST - 2 VIEW   COMPARISON:  Radiograph 05/21/2019, CT 12/23/2016   FINDINGS: Stable scarring and surgical material in the left apex. Additional scarring in the right apex. Chronic hyperinflation. No consolidation, features of edema, pneumothorax, or effusion. The cardiomediastinal contours are unremarkable. No acute osseous or soft  tissue abnormality.   IMPRESSION: 1. No acute cardiopulmonary disease. 2. Stable postsurgical changes in the left apex and right apical scarring. 3. Mild chronic hyperinflation.     Electronically Signed   By: Lovena Le M.D.   On: 09/24/2019 20:55   _________________________________  Another CTAP done 12/2018 ____________________________________________ Other:   _____________________________________________ Assessment & Plan  Assessment: Encounter Diagnosis  Name Primary?  . Chest wall pain Yes   This is definitely not digestive and appears to be musculoskeletal chest wall pain, probably costochondritis.  I do not think it is a pulmonary embolism.  It was deemed noncardiac in the ED and by the cardiologist.   Plan: Although this is not digestive, I have tried to help by recommending the following: Naprosyn 1 tablet twice a day I prescribed her 6 tablets of Percocet 5/325 mg, start by  taking it just at bedtime.  Do not take it during the day because of possible sedation. Apply heating pad to the area  I have forwarded this to Dr. Jenny Reichmann and will send in additional chat message to bring to his attention because she needs to be seen by primary care very soon.   30 minutes were spent on this encounter (including chart review, history/exam, counseling/coordination of care, and documentation)  Nelida Meuse III

## 2019-09-30 NOTE — Patient Instructions (Addendum)
If you are age 49 or older, your body mass index should be between 23-30. Your Body mass index is 25.34 kg/m. If this is out of the aforementioned range listed, please consider follow up with your Primary Care Provider.  If you are age 39 or younger, your body mass index should be between 19-25. Your Body mass index is 25.34 kg/m. If this is out of the aformentioned range listed, please consider follow up with your Primary Care Provider.   We have sent the following medications to your pharmacy for you to pick up at your convenience: Naproxen one tablet twice daily , Percocet as prescribed until seen by Primary care provider.    Apply heating pad to chest wall    Costochondritis Costochondritis is swelling and irritation (inflammation) of the tissue (cartilage) that connects your ribs to your breastbone (sternum). This causes pain in the front of your chest. Usually, the pain:  Starts gradually.  Is in more than one rib. This condition usually goes away on its own over time. Follow these instructions at home:  Do not do anything that makes your pain worse.  If directed, put ice on the painful area: ? Put ice in a plastic bag. ? Place a towel between your skin and the bag. ? Leave the ice on for 20 minutes, 2-3 times a day.  If directed, put heat on the affected area as often as told by your doctor. Use the heat source that your doctor tells you to use, such as a moist heat pack or a heating pad. ? Place a towel between your skin and the heat source. ? Leave the heat on for 20-30 minutes. ? Take off the heat if your skin turns bright red. This is very important if you cannot feel pain, heat, or cold. You may have a greater risk of getting burned.  Take over-the-counter and prescription medicines only as told by your doctor.  Return to your normal activities as told by your doctor. Ask your doctor what activities are safe for you.  Keep all follow-up visits as told by your doctor.  This is important. Contact a doctor if:  You have chills or a fever.  Your pain does not go away or it gets worse.  You have a cough that does not go away. Get help right away if:  You are short of breath. This information is not intended to replace advice given to you by your health care provider. Make sure you discuss any questions you have with your health care provider. Document Revised: 01/11/2017 Document Reviewed: 04/22/2015 Elsevier Patient Education  [employer].     It was a pleasure to see you today!  Dr. Loletha Carrow

## 2019-10-01 DIAGNOSIS — J479 Bronchiectasis, uncomplicated: Secondary | ICD-10-CM | POA: Diagnosis not present

## 2019-10-08 ENCOUNTER — Ambulatory Visit (INDEPENDENT_AMBULATORY_CARE_PROVIDER_SITE_OTHER): Payer: Medicare HMO | Admitting: Internal Medicine

## 2019-10-08 ENCOUNTER — Encounter: Payer: Self-pay | Admitting: Internal Medicine

## 2019-10-08 ENCOUNTER — Other Ambulatory Visit: Payer: Self-pay

## 2019-10-08 VITALS — BP 102/60 | HR 66 | Temp 98.2°F | Ht 67.0 in | Wt 161.0 lb

## 2019-10-08 DIAGNOSIS — K219 Gastro-esophageal reflux disease without esophagitis: Secondary | ICD-10-CM | POA: Diagnosis not present

## 2019-10-08 DIAGNOSIS — J449 Chronic obstructive pulmonary disease, unspecified: Secondary | ICD-10-CM | POA: Diagnosis not present

## 2019-10-08 DIAGNOSIS — R739 Hyperglycemia, unspecified: Secondary | ICD-10-CM | POA: Diagnosis not present

## 2019-10-08 DIAGNOSIS — R69 Illness, unspecified: Secondary | ICD-10-CM | POA: Diagnosis not present

## 2019-10-08 DIAGNOSIS — F419 Anxiety disorder, unspecified: Secondary | ICD-10-CM

## 2019-10-08 DIAGNOSIS — Z23 Encounter for immunization: Secondary | ICD-10-CM

## 2019-10-08 DIAGNOSIS — M940 Chondrocostal junction syndrome [Tietze]: Secondary | ICD-10-CM

## 2019-10-08 MED ORDER — TRAMADOL HCL 50 MG PO TABS
50.0000 mg | ORAL_TABLET | Freq: Four times a day (QID) | ORAL | 0 refills | Status: DC | PRN
Start: 2019-10-08 — End: 2019-12-27

## 2019-10-08 MED ORDER — CLONAZEPAM 0.5 MG PO TABS: 0.5000 mg | ORAL_TABLET | Freq: Two times a day (BID) | ORAL | 1 refills | Status: DC | PRN

## 2019-10-08 MED ORDER — NAPROXEN 500 MG PO TABS: 500.0000 mg | ORAL_TABLET | Freq: Two times a day (BID) | ORAL | 2 refills | Status: DC

## 2019-10-08 NOTE — Progress Notes (Addendum)
Subjective:    Patient ID: Hannah Barron, female    DOB: 09-29-1970, 49 y.o.   MRN: [id number]  HPI  Here with c/o left parasternal and costal margin sharp CP mild to mod intermittent pleuritic and occasionally positional x > 1 mo.  Pt denies other chest pain, increased sob or doe, wheezing, orthopnea, PND, increased LE swelling, palpitations, dizziness or syncope.  Pt denies new neurological symptoms such as new headache, or facial or extremity weakness or numbness   Pt denies polydipsia, polyuria, Denies worsening depressive symptoms, suicidal ideation, or panic; has ongoing anxiety  Denies worsening reflux, abd pain, dysphagia, n/v, bowel change or blood. Past Medical History:  Diagnosis Date  . Anemia   . Bronchiectasis ([place])   . COPD (chronic obstructive pulmonary disease) ([place])   . GERD (gastroesophageal reflux disease)   . Migraines   . Paroxysmal SVT (supraventricular tachycardia) ([place])    a. diagnosed in 11/2015.  . Right leg numbness    Past Surgical History:  Procedure Laterality Date  . CESAREAN SECTION    . CYSTOSCOPY N/A 04/05/2016   Procedure: CYSTOSCOPY;  Surgeon: Lavonia Drafts, MD;  Location: [facility];  Service: Gynecology;  Laterality: N/A;  . LAPAROSCOPIC VAGINAL HYSTERECTOMY WITH SALPINGECTOMY Bilateral 04/05/2016   Procedure: LAPAROSCOPIC ASSISTED VAGINAL HYSTERECTOMY WITH SALPINGECTOMY;  Surgeon: Lavonia Drafts, MD;  Location: [facility];  Service: Gynecology;  Laterality: Bilateral;  . LUNG BIOPSY    . VIDEO BRONCHOSCOPY Bilateral 03/29/2016   Procedure: VIDEO BRONCHOSCOPY WITHOUT FLUORO;  Surgeon: Marshell Garfinkel, MD;  Location: [facility];  Service: Cardiopulmonary;  Laterality: Bilateral;    reports that she has never smoked. She has never used smokeless tobacco. She reports that she does not drink alcohol and does not use drugs. family history includes Healthy in her father and mother. No Known Allergies Current Outpatient Medications on File Prior to  Visit  Medication Sig Dispense Refill  . albuterol (PROVENTIL HFA;VENTOLIN HFA) 108 (90 Base) MCG/ACT inhaler INHALE 2 PUFFS INTO THE LUNGS EVERY 6 HOURS AS NEEDED FOR WHEEZING OR SHORTNESS OF BREATH 54 Inhaler 3  . atorvastatin (LIPITOR) 20 MG tablet TAKE 1 TABLET BY MOUTH EVERY DAY 90 tablet 3  . budesonide-formoterol (SYMBICORT) 160-4.5 MCG/ACT inhaler Inhale 2 puffs into the lungs 2 (two) times daily. 1 Inhaler 5  . cyclobenzaprine (FLEXERIL) 5 MG tablet Take 1 tablet (5 mg total) by mouth 3 (three) times daily as needed for muscle spasms. 30 tablet 1  . dicyclomine (BENTYL) 20 MG tablet TAKE 1 TABLET (20 MG TOTAL) BY MOUTH EVERY 6 (SIX) HOURS. 60 tablet 1  . metoprolol succinate (TOPROL-XL) 50 MG 24 hr tablet TAKE 1 TABLET IN MORNING & 1/2 TABLET IN EVENING *TAKE 1/2 TAB AS NEEDED FOR PALPITATIONS/RAPID HR* 180 tablet 3  . omeprazole (PRILOSEC) 40 MG capsule TAKE 1 CAPSULE BY MOUTH EVERY DAY 90 capsule 3  . Respiratory Therapy Supplies (FLUTTER) DEVI Use as directed. 1 each 0  . RESTASIS 0.05 % ophthalmic emulsion Instill 1 drop into both eyes two times a day  2   No current facility-administered medications on file prior to visit.   Review of Systems All otherwise neg per pt   Objective:   Physical Exam BP 102/60 (BP Location: Left Arm, Patient Position: Sitting, Cuff Size: Large)   Pulse 66   Temp 98.2 F (36.8 C) (Oral)   Ht 5\' 7"  (1.702 m)   Wt 161 lb (73 kg)   LMP 03/31/2016   SpO2 97%  BMI 25.22 kg/m  VS noted,  Constitutional: Pt appears in NAD HENT: Head: NCAT.  Right Ear: External ear normal.  Left Ear: External ear normal.  Eyes: . Pupils are equal, round, and reactive to light. Conjunctivae and EOM are normal Nose: without d/c or deformity Neck: Neck supple. Gross normal ROM Tender left parasternal wall  Cardiovascular: Normal rate and regular rhythm.   Pulmonary/Chest: Effort normal and breath sounds without rales or wheezing.  Abd:  Soft, NT, ND, + BS, no  organomegaly Neurological: Pt is alert. At baseline orientation, motor grossly intact Skin: Skin is warm. No rashes, other new lesions, no LE edema Psychiatric: Pt behavior is normal without agitation  All otherwise neg per pt Lab Results  Component Value Date   WBC 7.1 09/24/2019   HGB 12.4 09/24/2019   HCT 36.8 09/24/2019   PLT 267 09/24/2019   GLUCOSE 100 (H) 09/24/2019   CHOL 165 04/30/2019   TRIG 57.0 04/30/2019   HDL 70.20 04/30/2019   LDLCALC 84 04/30/2019   ALT 20 04/30/2019   AST 19 04/30/2019   NA 135 09/24/2019   K 4.4 09/24/2019   CL 102 09/24/2019   CREATININE 0.77 09/24/2019   BUN 12 09/24/2019   CO2 23 09/24/2019   TSH 0.77 04/30/2019   INR 1.1 10/27/2014   HGBA1C 5.9 04/30/2019       Assessment & Plan:

## 2019-10-08 NOTE — Patient Instructions (Addendum)
Please remember to have your COVID booster shot by go online to [facility], or [facility], or [email or web address] to schedule the shot  Please take all new medication as prescribed - the naproxen as needed, the tramadol as well for pain as needed (but use the naproxen first), and the clonazempam for anxiety  Please continue all other medications as before, and refills have been done if requested.  Please have the pharmacy call with any other refills you may need.  Please continue your efforts at being more active, low cholesterol diet, and weight control.  Please keep your appointments with your specialists as you may have planned

## 2019-10-13 ENCOUNTER — Encounter: Payer: Self-pay | Admitting: Internal Medicine

## 2019-10-13 DIAGNOSIS — F419 Anxiety disorder, unspecified: Secondary | ICD-10-CM | POA: Insufficient documentation

## 2019-10-13 DIAGNOSIS — M940 Chondrocostal junction syndrome [Tietze]: Secondary | ICD-10-CM | POA: Insufficient documentation

## 2019-10-13 NOTE — Assessment & Plan Note (Signed)
stable overall by history and exam, recent data reviewed with pt, and pt to continue medical treatment as before,  to f/u any worsening symptoms or concerns  

## 2019-10-13 NOTE — Assessment & Plan Note (Addendum)
/  Mild to mod, for naproxen bid prn, and even tramadol prn breakthrough pain,  to f/u any worsening symptoms or concerns  I spent 31 minutes in preparing to see the patient by review of recent labs, imaging and procedures, obtaining and reviewing separately obtained history, communicating with the patient and family or caregiver, ordering medications, tests or procedures, and documenting clinical information in the EHR including the differential Dx, treatment, and any further evaluation and other management of costochondritis, anxiety, gerd, hyperglycemia, copd

## 2019-10-13 NOTE — Assessment & Plan Note (Signed)
Ok for klonopin bid prn limited rx,  to f/u any worsening symptoms or concerns

## 2019-10-31 DIAGNOSIS — J479 Bronchiectasis, uncomplicated: Secondary | ICD-10-CM | POA: Diagnosis not present

## 2019-11-12 ENCOUNTER — Other Ambulatory Visit: Payer: Self-pay

## 2019-11-12 ENCOUNTER — Ambulatory Visit: Payer: Medicare HMO | Admitting: Pulmonary Disease

## 2019-11-12 ENCOUNTER — Encounter: Payer: Self-pay | Admitting: Pulmonary Disease

## 2019-11-12 VITALS — BP 110/62 | HR 78 | Temp 97.5°F | Ht 67.0 in | Wt 161.4 lb

## 2019-11-12 DIAGNOSIS — R0602 Shortness of breath: Secondary | ICD-10-CM

## 2019-11-12 DIAGNOSIS — J471 Bronchiectasis with (acute) exacerbation: Secondary | ICD-10-CM | POA: Diagnosis not present

## 2019-11-12 NOTE — Progress Notes (Signed)
Hannah Barron    [id number]    08/28/1970  Primary Care Physician:John, Hunt Oris, MD  Referring Physician: Biagio Borg, MD [address] [place],  [place] [address]  Chief complaint:  Follow up for  Cystic bronchiectasis  HPI: 49 year old with chronic obstructive lung disease, bronchiectasis. She was previously followed at [place] with a VATS biopsy in 2003 and was told she had cystic bronchiectasis. She had followed up with a pulmonologist at [facility] but has moved to [place] on 2017. We have been unable to get records from [place] in spite of multiple attempts.  She is an immigrant from [place] in 1998 and does not report any exposure to tuberculosis. She does not recall getting a BCG vaccine as a child. Underwent a bronchoscopy to evaluate for MAI infection and a positive quantiferon as she was unable to give a good sputum sample. All results are negative except for positive PJP on DFA. She underwent further evaluation with negative HIV and beta glucan tests.  The PJP test was thought to be false positive She had a hysterectomy for uterine fibroids  On 3/27  08/09/2018 Seen at last visit for bronchiectasis exacerbation which was treated with Z-Pak and prednisone States that she is improved back to baseline Still has occasional cough with mucus production Using her Symbicort.  Needs albuterol about every other day  Interim history: Doing well with no issues.  Breathing is stable She is received percussion vest and is using it regularly.  Outpatient Encounter Medications as of 11/12/2019  Medication Sig  . albuterol (PROVENTIL HFA;VENTOLIN HFA) 108 (90 Base) MCG/ACT inhaler INHALE 2 PUFFS INTO THE LUNGS EVERY 6 HOURS AS NEEDED FOR WHEEZING OR SHORTNESS OF BREATH  . atorvastatin (LIPITOR) 20 MG tablet TAKE 1 TABLET BY MOUTH EVERY DAY  . budesonide-formoterol (SYMBICORT) 160-4.5 MCG/ACT inhaler Inhale 2 puffs into the lungs 2 (two) times daily.  . clonazePAM  (KLONOPIN) 0.5 MG tablet Take 1 tablet (0.5 mg total) by mouth 2 (two) times daily as needed for anxiety.  . cyclobenzaprine (FLEXERIL) 5 MG tablet Take 1 tablet (5 mg total) by mouth 3 (three) times daily as needed for muscle spasms.  Marland Kitchen dicyclomine (BENTYL) 20 MG tablet TAKE 1 TABLET (20 MG TOTAL) BY MOUTH EVERY 6 (SIX) HOURS.  . metoprolol succinate (TOPROL-XL) 50 MG 24 hr tablet TAKE 1 TABLET IN MORNING & 1/2 TABLET IN EVENING *TAKE 1/2 TAB AS NEEDED FOR PALPITATIONS/RAPID HR*  . naproxen (NAPROSYN) 500 MG tablet Take 1 tablet (500 mg total) by mouth 2 (two) times daily with a meal.  . omeprazole (PRILOSEC) 40 MG capsule TAKE 1 CAPSULE BY MOUTH EVERY DAY  . Respiratory Therapy Supplies (FLUTTER) DEVI Use as directed.  . RESTASIS 0.05 % ophthalmic emulsion Instill 1 drop into both eyes two times a day  . traMADol (ULTRAM) 50 MG tablet Take 1 tablet (50 mg total) by mouth every 6 (six) hours as needed.   No facility-administered encounter medications on file as of 11/12/2019.   Physical Exam: Blood pressure 110/62, pulse 78, temperature (!) 97.5 F (36.4 C), temperature source Skin, height 5\' 7"  (1.702 m), weight 161 lb 6.4 oz (73.2 kg), last menstrual period 03/31/2016, SpO2 99 %. Gen:      No acute distress HEENT:  EOMI, sclera anicteric Neck:     No masses; no thyromegaly Lungs:    Clear to auscultation bilaterally; normal respiratory effort CV:  Regular rate and rhythm; no murmurs Abd:      + bowel sounds; soft, non-tender; no palpable masses, no distension Ext:    No edema; adequate peripheral perfusion Skin:      Warm and dry; no rash Neuro: alert and oriented x 3 Psych: normal mood and affect  Data Reviewed: Imaging CT high resolution 07/16/15-areas of cystic and varicose bronchiectasis, extensive air trapping, multiple pulmonary nodules. CT chest 03/21/16-areas of bronchiectasis, air trapping and pulmonary nodules. Unchanged compared to 2017 CT angiogram 12/23/16-stable  areas of bronchiectasis, air trapping and pulmonary nodules.  No pulmonary embolism. Chest x-ray 2/24- chronic biapical scarring.  Postsurgical changes in the left upper lobe.  I have reviewed the images personally. I have reviewed all images personally.  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 Discussed INH therapy but defer as per pt wishes CF panel 03/25/15- negative for 97 mutations analyzed (report scanned)  Cardiac Myocardial perfusion study 12/2015 >neg  Ischemia , EF 55% Echo 10/2015 >EF 65%.  Echo 11/2018 > EF 65%, normal valves, no wall motion abnormalities   Bronchoscopy 03/29/16 Cultures, AFB, fungal cultures-negative to date PJP DFA-positive Cytology-no malignant cells, CD4: CD8 ratio-1.62 Cell count WBC-26, 26% lymphs, 50% neutrophils, 24% monocyte macrophage  Assessment:  Cystic bronchiectasis She's had workup which is negative for alpha-1 antitrypsin, immunoglobulin deficiency, autoimmune disease, cystic fibrosis and mycobacterial infections.    Overall she reports she is doing well Continues on Symbicort, albuterol as needed. Continue Mucinex and flutter valve.  She will need her flutter valve replaced as the old one is breaking down  She got the percussion vest since last visit which she is using regularly.  Plan/Recommendations: - Continue symbicort, albuterol - Continue Flutter valve,  Mucinex, percussion vest - Get follow-up CT scan and PFTs in 6 months  Marshell Garfinkel MD [facility] 11/12/2019, 9:20 AM   CC: Biagio Borg, MD

## 2019-11-12 NOTE — Patient Instructions (Addendum)
I am glad you are doing well with regard to your breathing Continue the Symbicort, percussion vest We will give you a new flutter valve  Get a CT chest without contrast and PFTs in 6 months Follow-up in clinic after that.

## 2019-12-01 DIAGNOSIS — J479 Bronchiectasis, uncomplicated: Secondary | ICD-10-CM | POA: Diagnosis not present

## 2019-12-24 ENCOUNTER — Other Ambulatory Visit: Payer: Self-pay | Admitting: Internal Medicine

## 2019-12-24 NOTE — Progress Notes (Signed)
Cardiology Clinic Note   Patient Name: Hannah Barron Date of Encounter: 12/27/2019  Primary Care Provider:  Biagio Borg, MD Primary Cardiologist:  Peter Martinique, MD  Patient Profile    Hannah Barron 49 year old female presents to the clinic today for follow-up of her chest pain.  Past Medical History    Past Medical History:  Diagnosis Date  . Anemia   . Bronchiectasis ([place])   . COPD (chronic obstructive pulmonary disease) ([place])   . GERD (gastroesophageal reflux disease)   . Migraines   . Paroxysmal SVT (supraventricular tachycardia) ([place])    a. diagnosed in 11/2015.  . Right leg numbness    Past Surgical History:  Procedure Laterality Date  . CESAREAN SECTION    . CYSTOSCOPY N/A 04/05/2016   Procedure: CYSTOSCOPY;  Surgeon: Lavonia Drafts, MD;  Location: [facility];  Service: Gynecology;  Laterality: N/A;  . LAPAROSCOPIC VAGINAL HYSTERECTOMY WITH SALPINGECTOMY Bilateral 04/05/2016   Procedure: LAPAROSCOPIC ASSISTED VAGINAL HYSTERECTOMY WITH SALPINGECTOMY;  Surgeon: Lavonia Drafts, MD;  Location: [facility];  Service: Gynecology;  Laterality: Bilateral;  . LUNG BIOPSY    . VIDEO BRONCHOSCOPY Bilateral 03/29/2016   Procedure: VIDEO BRONCHOSCOPY WITHOUT FLUORO;  Surgeon: Marshell Garfinkel, MD;  Location: [facility];  Service: Cardiopulmonary;  Laterality: Bilateral;    Allergies  No Known Allergies  History of Present Illness    Ms.Barclift has a PMH of anemia, bronchiectasis, COPD, GERD, migraines, paroxysmal SVT, and right leg numbness.  She presented to the emergency department on 09/25/2019 for an evaluation of her chest pain.  She reported as dull nonradiating, moderate, gradual in onset which comes and goes.  She indicated that been present for 5 days.  She noted that it was worse at bedtime and that nothing had relieved her discomfort.    She presented to the clinic 09/27/2019 for further evaluation and stated she had stomach type pain underneath her left breast  nightly for the last 5 nights.  She indicated that it was worse and describes it as a dull burning pain with laying down.  She did not have any chest pain or chest discomfort with increased physical activity.  She had no emesis or increased fatigue.  She indicate that she had not yet picked up her Carafate but was taking her omeprazole daily.  She had previously seen a gastroenterologist and indicated she was going to make an appointment with them after leaving our office.  I will gave her information on GERD diet and instructions for managing her reflux.  Her follow-up with Dr. Martinique was planned for 3 months.  She was seen by pulmonology 11/12/2019.  She was doing well at that time.  Her breathing was stable.  She reported compliance with her percussion vest and was using it regularly.  She presents the clinic today for follow-up evaluation states she went to see gastroenterology who told her that her stomach was not the cause of her symptoms.  She was referred back to her PCP who described muscle wall pain.  She reports that she continues to have occasional intermittent dull aching type pain in her left chest.  She states the pain does not come on with activity.  She exercises every morning with calisthenics and different types of stretches.  She also reported that she did not start her Carafate or Prilosec medication due to cost.  She reported that her husband left their home last year and that she has a hard time pain for her housing and her medications.  She became chart tearful during the interview.  I will order a consult for social work, give her the salty 6 diet sheet, have her increase her physical activity as tolerated, and have her return for follow-up in 12 months.   Today she denies chest pain, shortness of breath, lower extremity edema, fatigue, palpitations, melena, hematuria, hemoptysis, diaphoresis, weakness, presyncope, syncope, orthopnea, and PND.  Home Medications    Prior to  Admission medications   Medication Sig Start Date End Date Taking? Authorizing Provider  albuterol (PROVENTIL HFA;VENTOLIN HFA) 108 (90 Base) MCG/ACT inhaler INHALE 2 PUFFS INTO THE LUNGS EVERY 6 HOURS AS NEEDED FOR WHEEZING OR SHORTNESS OF BREATH 01/23/18   Lance Sell, NP  atorvastatin (LIPITOR) 20 MG tablet TAKE 1 TABLET BY MOUTH EVERY DAY 09/26/19   Biagio Borg, MD  budesonide-formoterol [facility]) 160-4.5 MCG/ACT inhaler Inhale 2 puffs into the lungs 2 (two) times daily. 12/20/17   Mannam, Hart Robinsons, MD  clonazePAM (KLONOPIN) 0.5 MG tablet Take 1 tablet (0.5 mg total) by mouth 2 (two) times daily as needed for anxiety. 10/08/19   Biagio Borg, MD  cyclobenzaprine (FLEXERIL) 5 MG tablet Take 1 tablet (5 mg total) by mouth 3 (three) times daily as needed for muscle spasms. 06/28/19   Biagio Borg, MD  dicyclomine (BENTYL) 20 MG tablet TAKE 1 TABLET (20 MG TOTAL) BY MOUTH EVERY 6 (SIX) HOURS. 09/14/19   Biagio Borg, MD  metoprolol succinate (TOPROL-XL) 50 MG 24 hr tablet TAKE 1 TABLET IN MORNING & 1/2 TABLET IN EVENING *TAKE 1/2 TAB AS NEEDED FOR PALPITATIONS/RAPID HR* 03/08/19   Martinique, Peter M, MD  naproxen (NAPROSYN) 500 MG tablet Take 1 tablet (500 mg total) by mouth 2 (two) times daily with a meal. 10/08/19   Biagio Borg, MD  omeprazole (PRILOSEC) 40 MG capsule TAKE 1 CAPSULE BY MOUTH EVERY DAY 09/26/19   Biagio Borg, MD  Respiratory Therapy Supplies (FLUTTER) DEVI Use as directed. 03/16/16   Parrett, Fonnie Mu, NP  RESTASIS 0.05 % ophthalmic emulsion Instill 1 drop into both eyes two times a day 04/25/15   [provider]  traMADol (ULTRAM) 50 MG tablet Take 1 tablet (50 mg total) by mouth every 6 (six) hours as needed. 10/08/19   Biagio Borg, MD    Family History    Family History  Problem Relation Age of Onset  . Healthy Mother   . Healthy Father    She indicated that her mother is alive. She indicated that her father is alive. She indicated that her maternal  grandmother is deceased. She indicated that her maternal grandfather is deceased. She indicated that her paternal grandmother is deceased. She indicated that her paternal grandfather is deceased.  Social History    Social History   Socioeconomic History  . Marital status: Married    Spouse name: Not on file  . Number of children: 1  . Years of education: 58  . Highest education level: Not on file  Occupational History  . Occupation: disability  Tobacco Use  . Smoking status: Never Smoker  . Smokeless tobacco: Never Used  Vaping Use  . Vaping Use: Never used  Substance and Sexual Activity  . Alcohol use: No    Alcohol/week: 0.0 standard drinks  . Drug use: No  . Sexual activity: Yes    Birth control/protection: Surgical  Other Topics Concern  . Not on file  Social History Narrative   Fun: Watch TV   Social Determinants  of Health   Financial Resource Strain: Not on file  Food Insecurity: Not on file  Transportation Needs: Not on file  Physical Activity: Not on file  Stress: Not on file  Social Connections: Not on file  Intimate Partner Violence: Not on file     Review of Systems    General:  No chills, fever, night sweats or weight changes.  Cardiovascular:  No chest pain, dyspnea on exertion, edema, orthopnea, palpitations, paroxysmal nocturnal dyspnea. Dermatological: No rash, lesions/masses Respiratory: No cough, dyspnea Urologic: No hematuria, dysuria Abdominal:   No nausea, vomiting, diarrhea, bright red blood per rectum, melena, or hematemesis Neurologic:  No visual changes, wkns, changes in mental status. All other systems reviewed and are otherwise negative except as noted above.  Physical Exam    VS:  BP (!) 96/52   Pulse 84   Ht 5\' 7"  (1.702 m)   Wt 162 lb 9.6 oz (73.8 kg)   LMP 03/31/2016   SpO2 98%   BMI 25.47 kg/m  , BMI Body mass index is 25.47 kg/m. GEN: Well nourished, well developed, in no acute distress. HEENT: normal. Neck: Supple, no  JVD, carotid bruits, or masses. Cardiac: RRR, no murmurs, rubs, or gallops. No clubbing, cyanosis, edema.  Radials/DP/PT 2+ and equal bilaterally.  Respiratory:  Respirations regular and unlabored, clear to auscultation bilaterally. GI: Soft, nontender, nondistended, BS + x 4. MS: no deformity or atrophy. Skin: warm and dry, no rash. Neuro:  Strength and sensation are intact. Psych: Normal affect.  Accessory Clinical Findings    Recent Labs: 04/30/2019: ALT 20; TSH 0.77 09/24/2019: BUN 12; Creatinine, Ser 0.77; Hemoglobin 12.4; Platelets 267; Potassium 4.4; Sodium 135   Recent Lipid Panel    Component Value Date/Time   CHOL 165 04/30/2019 1104   TRIG 57.0 04/30/2019 1104   HDL 70.20 04/30/2019 1104   CHOLHDL 2 04/30/2019 1104   VLDL 11.4 04/30/2019 1104   LDLCALC 84 04/30/2019 1104    ECG personally reviewed by me today-none today.  EKG 09/26/2019 Normal sinus rhythm left atrial enlargement 70 bpm no ST or T wave deviation  Echocardiogram 11/27/2018 IMPRESSIONS    1. Left ventricular ejection fraction, by visual estimation, is 65 to  70%. The left ventricle has hyperdynamic function. There is mildly  increased left ventricular hypertrophy. Normal diastolic function.  2. The average left ventricular global longitudinal strain is -20.7 %.  3. Global right ventricle has normal systolic function.The right  ventricular size is normal. No increase in right ventricular wall  thickness.  4. Left atrial size was normal.  5. Right atrial size was normal.  6. The mitral valve is normal in structure. No evidence of mitral valve  regurgitation.  7. The tricuspid valve is normal in structure. Tricuspid valve  regurgitation is not demonstrated.  8. The aortic valve is normal in structure. Aortic valve regurgitation is  not visualized. No evidence of aortic valve sclerosis or stenosis.  9. The pulmonic valve was not well visualized. Pulmonic valve  regurgitation is not  visualized.  10. The inferior vena cava is normal in size with greater than 50%  respiratory variability, suggesting right atrial pressure of 3 mmHg.  Assessment & Plan   1.  Atypical chest pain-continues with intermittent episodes of dull muscle wall pain.  Pain is nonexertional.  Presented to the emergency department 09/25/2019.  Indicated that she had been having dull constant chest pain for the last 5 days.  She had no radiating symptoms and noticed that  the pain was worse at night when she would lay down.  No acute EKG changes.  Troponins flat at less than 2 and 4.  Appeared to be GI nature.  Reassured that it is not related to her heart. Continue to monitor  Gerd-much fewer reflux type symptoms.   No pain with physical activity.    Continues with slight dull aching in the evening with laying down. Continue Prilosec, Carafate Reviewed Gerd diet and handout Increase physical activity as tolerated   Essential hypertension-BP today  96/52.  Well-controlled at home Continue metoprolol Heart healthy low-sodium diet-salty 6 given Increase physical activity as tolerated   Hyperlipidemia-LDL 84 on 04/30/2019.  Total cholesterol 165, triglycerides 57, HDL 70.2 Continue atorvastatin Heart healthy low-sodium diet-salty 6 given Increase physical activity as tolerated  Disposition: Follow-up with Dr. Martinique or me in 12 months.   Jossie Ng. Madalina Rosman NP-C    12/27/2019, 2:13 PM [facility] [address] Office [phone] Fax [phone]  Notice: This dictation was prepared with Dragon dictation along with smaller phrase technology. Any transcriptional errors that result from this process are unintentional and may not be corrected upon review.

## 2019-12-27 ENCOUNTER — Encounter: Payer: Self-pay | Admitting: General Practice

## 2019-12-27 ENCOUNTER — Other Ambulatory Visit: Payer: Self-pay

## 2019-12-27 ENCOUNTER — Ambulatory Visit: Payer: Medicare HMO | Admitting: General Practice

## 2019-12-27 VITALS — BP 96/52 | HR 84 | Ht 67.0 in | Wt 162.6 lb

## 2019-12-27 DIAGNOSIS — K2100 Gastro-esophageal reflux disease with esophagitis, without bleeding: Secondary | ICD-10-CM | POA: Diagnosis not present

## 2019-12-27 DIAGNOSIS — E785 Hyperlipidemia, unspecified: Secondary | ICD-10-CM | POA: Diagnosis not present

## 2019-12-27 DIAGNOSIS — R0789 Other chest pain: Secondary | ICD-10-CM

## 2019-12-27 DIAGNOSIS — I10 Essential (primary) hypertension: Secondary | ICD-10-CM | POA: Diagnosis not present

## 2019-12-27 NOTE — Progress Notes (Signed)
Heart and Vascular Care Navigation  12/27/2019  Hannah Barron [birth year] [id number]  Reason for Referral:  Financial challenges around housing, basic needs. Pt tearful.                                                                                             Assessment:   CSW spoke with pt during visit. Referred by Sharyn Lull, [place] introduced self, role, reason for visit. Pt from home alone. She confirmed home address, PCP, insurance coverage and emergency contact. She moved to [facility] from [place] about 5 years ago with her daughter and husband for her daughter to attend college in [place]. Her husband left them about a year ago and it has created a lot of financial stress and emotionally she also has had a hard time.   She currently receives a disability check which mostly goes towards her rent and which includes cable/internet but no other utilities. She has just signed a new lease but is open to understanding other options moving forward that may be more affordable. Pt daughter assists as able with additional costs and pt applied for COVID relief but has not heard back yet about her application. She is open for referral to [facility] who is [place] residents with utility and rental assistance and assessing status of previous applications.  Pt able to drive herself to appointments but was informed about [facility] should pt find herself in need of a ride to [facility] appointments. Pt is not currently receiving SNAP (food stamps) and is interested in having assistance w/ this application so that she can see if she is eligible and relieve some burden with those costs. CSW assisted pt with completing the referral forms for [facility] who can assist pt.   Pt aware that I will f/u with her next week and ensure that she has gotten connected w/ these resources. Provided verbal support for pt for sharing these  challenges with [place] as much of her stress can effect her overall health and we are here to help as much as we can to find ways to meet her other social/emotional needs.                                   HRT/VAS Care Coordination    Patients Home Cardiology Office [place] Team Social Worker   Social Worker Name: Westley Hummer, LCSW, [place] arrangements for the past 2 months Apartment   Lives with: Self   Patient Current Insurance Coverage Medicaid; Managed Medicare   Patient Has Concern With Paying Medical Bills No   Does Patient Have Prescription Coverage? Yes   Home Assistive Devices/Equipment None      Social History:  SDOH Screenings   Alcohol Screen: Not on file  Depression (PHQ2-9): Low Risk   . PHQ-2 Score: 0  Financial Resource Strain: High Risk  . Difficulty of Paying Living Expenses: Very hard  Food Insecurity: Food Insecurity Present  . Worried About Charity fundraiser in the Last Year: Sometimes true  . Ran Out of Food in the Last Year: Sometimes true  Housing: Medium Risk  . Last Housing Risk Score: 1  Physical Activity: Not on file  Social Connections: Not on file  Stress: Not on file  Tobacco Use: Low Risk   . Smoking Tobacco Use: Never Smoker  . Smokeless Tobacco Use: Never Used  Transportation Needs: No Transportation Needs  . Lack of Transportation (Medical): No  . Lack of Transportation (Non-Medical): No    SDOH Interventions: Financial Resources:  Financial Strain Interventions: Other (Comment) ([place] for [facility] [employer] Studies)   Food Insecurity:  Food Insecurity Interventions: Assist with Chief Operating Officer (referral to [employer] for [employer])  Housing Insecurity:  Housing Interventions: Other (Comment) (Referral to [facility] for Housing and [employer] Studies)  Transportation:   Transportation Interventions: [employer]    Follow-up plan:   Referral sent to [employer] to assess for food stamp eligibility. I will mail pt affordable housing options to review after this lease is up. Pt referral also given to [facility] for [facility] and Community [facility] and will f/u next week to understand if connections made. CSW also mailed my card w/ housing information to pt for any additional questions.

## 2019-12-27 NOTE — Patient Instructions (Signed)
Medication Instructions:  The current medical regimen is effective;  continue present plan and medications as directed. Please refer to the Current Medication list given to you today.  *If you need a refill on your cardiac medications before your next appointment, please call your pharmacy*  Lab Work:   Testing/Procedures:  NONE    NONE  Special Instructions  PLEASE READ AND FOLLOW SALTY 6-ATTACHED-1,800mg  daily  Follow-Up: Your next appointment:  12 month(s) In Person with You may see Peter Martinique, MD OR IF UNAVAILABLE JESSE CLEAVER, FNP-C or one of the following Advanced Practice Providers on your designated Care Team:  Almyra Deforest, PA-C  Fabian Sharp, [place] or Roby Lofts, PA-C Please call our office 2 months in advance to schedule this appointment   At [facility], you and your health needs are our priority.  As part of our continuing mission to provide you with exceptional heart care, we have created designated Provider Care Teams.  These Care Teams include your primary Cardiologist (physician) and Advanced Practice Providers (APPs -  Physician Assistants and Nurse Practitioners) who all work together to provide you with the care you need, when you need it.  We recommend signing up for the patient portal called "MyChart".  Sign up information is provided on this After Visit Summary.  MyChart is used to connect with patients for Virtual Visits (Telemedicine).  Patients are able to view lab/test results, encounter notes, upcoming appointments, etc.  Non-urgent messages can be sent to your provider as well.   To learn more about what you can do with MyChart, go to [email or web address].              6 SALTY THINGS TO AVOID     1,800MG  DAILY

## 2019-12-30 ENCOUNTER — Telehealth: Payer: Self-pay | Admitting: Licensed Clinical Social Worker

## 2019-12-30 NOTE — Telephone Encounter (Signed)
Message left HIPAA compliant message on behalf of pt with the [facility] [phone]) Pt gave permission for CSW assistance as she had submitted an assistance application but has not heard back/does not know the status of that application.   Westley Hummer, MSW, [address]  [phone]

## 2019-12-30 NOTE — Telephone Encounter (Signed)
Received a call back from [facility], Nonah Mattes will give pt a call and assess for any assistance programs that may be available for pt at this time for rent/utilities. Will f/u later this week to ensure that pt has made contact w/ their staff.   Westley Hummer, MSW, [place]  [phone]

## 2019-12-31 DIAGNOSIS — J479 Bronchiectasis, uncomplicated: Secondary | ICD-10-CM | POA: Diagnosis not present

## 2020-01-01 ENCOUNTER — Telehealth: Payer: Self-pay | Admitting: Licensed Clinical Social Worker

## 2020-01-01 NOTE — Telephone Encounter (Signed)
Called and spoke with pt this morning at [phone]. Re-introduced self, role, reason for call. 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 She saved my number and will call with any additional questions/challenges. No additional needs at this time.   Westley Hummer, MSW, [place]  [phone]

## 2020-01-16 ENCOUNTER — Telehealth: Payer: Self-pay | Admitting: Licensed Clinical Social Worker

## 2020-01-16 NOTE — Telephone Encounter (Signed)
CSW received update from [facility] that pt has been approved for SNAP benefits. CSW remains available for any additional assistance.   Octavio Graves, MSW, LCSW [facility]  [phone]

## 2020-01-31 DIAGNOSIS — J479 Bronchiectasis, uncomplicated: Secondary | ICD-10-CM | POA: Diagnosis not present

## 2020-02-24 ENCOUNTER — Other Ambulatory Visit: Payer: Self-pay

## 2020-02-24 ENCOUNTER — Encounter: Payer: Self-pay | Admitting: Pulmonary Disease

## 2020-02-24 ENCOUNTER — Ambulatory Visit: Payer: Medicare HMO

## 2020-02-24 ENCOUNTER — Ambulatory Visit: Payer: Medicare HMO | Admitting: Internal Medicine

## 2020-02-24 ENCOUNTER — Ambulatory Visit (INDEPENDENT_AMBULATORY_CARE_PROVIDER_SITE_OTHER): Payer: Medicare HMO | Admitting: Pulmonary Disease

## 2020-02-24 VITALS — BP 118/76 | HR 74 | Temp 97.1°F | Ht 67.0 in | Wt 163.0 lb

## 2020-02-24 DIAGNOSIS — J479 Bronchiectasis, uncomplicated: Secondary | ICD-10-CM

## 2020-02-24 DIAGNOSIS — R0600 Dyspnea, unspecified: Secondary | ICD-10-CM

## 2020-02-24 DIAGNOSIS — R0609 Other forms of dyspnea: Secondary | ICD-10-CM

## 2020-02-24 MED ORDER — ALBUTEROL SULFATE HFA 108 (90 BASE) MCG/ACT IN AERS: INHALATION_SPRAY | RESPIRATORY_TRACT | 3 refills | Status: DC

## 2020-02-24 MED ORDER — BUDESONIDE-FORMOTEROL FUMARATE 160-4.5 MCG/ACT IN AERO: 2.0000 | INHALATION_SPRAY | Freq: Two times a day (BID) | RESPIRATORY_TRACT | 5 refills | Status: DC

## 2020-02-24 NOTE — Patient Instructions (Signed)
I am glad you are feeling well Will order high-res CT and PFTs in 6 months and follow-up in clinic  We will give you flutter valve since you lost your personal device We will give you a letter stating that it is okay for you to go back to work  Follow-up in 6 months

## 2020-02-24 NOTE — Addendum Note (Signed)
Addended by: Valerie Salts on: 02/24/2020 09:32 AM   Modules accepted: Orders

## 2020-02-24 NOTE — Progress Notes (Signed)
Hannah Barron    [id number]    02/04/1970  Primary Care Physician:John, Hunt Oris, MD  Referring Physician: Biagio Borg, MD [address] [place],  [place] [address]  Chief complaint:  Follow up for  Cystic bronchiectasis  HPI: 50 year old with chronic obstructive lung disease, bronchiectasis. She was previously followed at [place] with a VATS biopsy in 2003 and was told she had cystic bronchiectasis. She had followed up with a pulmonologist at [facility] but has moved to [place] on 2017. We have been unable to get records from [place] in spite of multiple attempts.  She is an immigrant from [place] in 1998 and does not report any exposure to tuberculosis. She does not recall getting a BCG vaccine as a child. Underwent a bronchoscopy to evaluate for MAI infection and a positive quantiferon as she was unable to give a good sputum sample. All results are negative except for positive PJP on DFA. She underwent further evaluation with negative HIV and beta glucan tests.  The PJP test was thought to be false positive She had a hysterectomy for uterine fibroids  On 3/27  Last bronchiectasis exacerbation was in July 2020 which was treated with Z-Pak and prednisone She has separated from her husband in 2021 and reports significant improvement in stress and breathing issues since then . Interim history: Doing well with no issues.  Breathing is stable She has lost her flutter valve and is requesting a replacement and a letter to be able to return to work.  Outpatient Encounter Medications as of 02/24/2020  Medication Sig  . albuterol (PROVENTIL HFA;VENTOLIN HFA) 108 (90 Base) MCG/ACT inhaler INHALE 2 PUFFS INTO THE LUNGS EVERY 6 HOURS AS NEEDED FOR WHEEZING OR SHORTNESS OF BREATH  . atorvastatin (LIPITOR) 20 MG tablet TAKE 1 TABLET BY MOUTH EVERY DAY  . budesonide-formoterol (SYMBICORT) 160-4.5 MCG/ACT inhaler Inhale 2 puffs into the lungs 2 (two) times daily.  . clonazePAM  (KLONOPIN) 0.5 MG tablet Take 1 tablet (0.5 mg total) by mouth 2 (two) times daily as needed for anxiety.  . cyclobenzaprine (FLEXERIL) 5 MG tablet Take 1 tablet (5 mg total) by mouth 3 (three) times daily as needed for muscle spasms.  Marland Kitchen dicyclomine (BENTYL) 20 MG tablet TAKE 1 TABLET (20 MG TOTAL) BY MOUTH EVERY 6 (SIX) HOURS.  . metoprolol succinate (TOPROL-XL) 50 MG 24 hr tablet TAKE 1 TABLET IN MORNING & 1/2 TABLET IN EVENING *TAKE 1/2 TAB AS NEEDED FOR PALPITATIONS/RAPID HR*  . naproxen (NAPROSYN) 500 MG tablet TAKE 1 TABLET (500 MG TOTAL) BY MOUTH 2 (TWO) TIMES DAILY WITH A MEAL.  Marland Kitchen omeprazole (PRILOSEC) 40 MG capsule TAKE 1 CAPSULE BY MOUTH EVERY DAY  . Respiratory Therapy Supplies (FLUTTER) DEVI Use as directed.  . RESTASIS 0.05 % ophthalmic emulsion Instill 1 drop into both eyes two times a day   No facility-administered encounter medications on file as of 02/24/2020.   Physical Exam: Blood pressure 118/76, pulse 74, temperature (!) 97.1 F (36.2 C), temperature source Temporal, height 5\' 7"  (1.702 m), weight 163 lb (73.9 kg), last menstrual period 03/31/2016, SpO2 99 %. Gen:      No acute distress HEENT:  EOMI, sclera anicteric Neck:     No masses; no thyromegaly Lungs:    Clear to auscultation bilaterally; normal respiratory effort CV:         Regular rate and rhythm; no murmurs Abd:      + bowel sounds; soft,  non-tender; no palpable masses, no distension Ext:    No edema; adequate peripheral perfusion Skin:      Warm and dry; no rash Neuro: alert and oriented x 3 Psych: normal mood and affect  Data Reviewed: Imaging CT high resolution 07/16/15-areas of cystic and varicose bronchiectasis, extensive air trapping, multiple pulmonary nodules. CT chest 03/21/16-areas of bronchiectasis, air trapping and pulmonary nodules. Unchanged compared to 2017 CT angiogram 12/23/16-stable areas of bronchiectasis, air trapping and pulmonary nodules.  No pulmonary embolism. Chest x-ray 2/24-  chronic biapical scarring.  Postsurgical changes in the left upper lobe.  I have reviewed the images personally. I have reviewed all images personally.  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 Discussed INH therapy but defer as per pt wishes CF panel 03/25/15- negative for 97 mutations analyzed (report scanned)  Cardiac Myocardial perfusion study 12/2015 >neg  Ischemia , EF 55% Echo 10/2015 >EF 65%.  Echo 11/2018 > EF 65%, normal valves, no wall motion abnormalities   Bronchoscopy 03/29/16 Cultures, AFB, fungal cultures-negative to date PJP DFA-positive Cytology-no malignant cells, CD4: CD8 ratio-1.62 Cell count WBC-26, 26% lymphs, 50% neutrophils, 24% monocyte macrophage  Assessment:  Cystic bronchiectasis She's had workup which is negative for alpha-1 antitrypsin, immunoglobulin deficiency, autoimmune disease, cystic fibrosis and mycobacterial infections.    Overall she reports she is doing well Continues on Symbicort, albuterol as needed. Continue Mucinex and flutter valve.  She will need her flutter valve replaced as the old one is lost. Give letter saying it is okay for her to go back to work  She got the percussion vest since last visit which she is using regularly.  Plan/Recommendations: - Continue symbicort, albuterol - Continue Flutter valve,  Mucinex, percussion vest - Work letter - Get follow-up CT scan and PFTs in 6 months  Marshell Garfinkel MD [facility] 02/24/2020, 9:06 AM   CC: Biagio Borg, MD

## 2020-03-02 DIAGNOSIS — J479 Bronchiectasis, uncomplicated: Secondary | ICD-10-CM | POA: Diagnosis not present

## 2020-03-09 DIAGNOSIS — H16223 Keratoconjunctivitis sicca, not specified as Sjogren's, bilateral: Secondary | ICD-10-CM | POA: Diagnosis not present

## 2020-03-12 ENCOUNTER — Ambulatory Visit: Payer: Medicare HMO

## 2020-03-12 ENCOUNTER — Ambulatory Visit: Payer: Medicare HMO | Admitting: Internal Medicine

## 2020-03-17 ENCOUNTER — Other Ambulatory Visit: Payer: Self-pay | Admitting: Cardiology

## 2020-03-23 ENCOUNTER — Encounter: Payer: Self-pay | Admitting: Internal Medicine

## 2020-03-23 ENCOUNTER — Ambulatory Visit (INDEPENDENT_AMBULATORY_CARE_PROVIDER_SITE_OTHER): Payer: Medicare HMO

## 2020-03-23 ENCOUNTER — Other Ambulatory Visit: Payer: Self-pay

## 2020-03-23 ENCOUNTER — Ambulatory Visit (INDEPENDENT_AMBULATORY_CARE_PROVIDER_SITE_OTHER): Payer: Medicare HMO | Admitting: Internal Medicine

## 2020-03-23 VITALS — BP 116/74 | HR 66 | Temp 98.2°F | Ht 67.0 in | Wt 168.0 lb

## 2020-03-23 DIAGNOSIS — M549 Dorsalgia, unspecified: Secondary | ICD-10-CM

## 2020-03-23 DIAGNOSIS — K219 Gastro-esophageal reflux disease without esophagitis: Secondary | ICD-10-CM | POA: Diagnosis not present

## 2020-03-23 DIAGNOSIS — E785 Hyperlipidemia, unspecified: Secondary | ICD-10-CM | POA: Diagnosis not present

## 2020-03-23 DIAGNOSIS — I10 Essential (primary) hypertension: Secondary | ICD-10-CM | POA: Diagnosis not present

## 2020-03-23 DIAGNOSIS — E538 Deficiency of other specified B group vitamins: Secondary | ICD-10-CM

## 2020-03-23 DIAGNOSIS — Z0000 Encounter for general adult medical examination without abnormal findings: Secondary | ICD-10-CM

## 2020-03-23 DIAGNOSIS — M47814 Spondylosis without myelopathy or radiculopathy, thoracic region: Secondary | ICD-10-CM | POA: Diagnosis not present

## 2020-03-23 DIAGNOSIS — G8929 Other chronic pain: Secondary | ICD-10-CM | POA: Diagnosis not present

## 2020-03-23 DIAGNOSIS — Z0001 Encounter for general adult medical examination with abnormal findings: Secondary | ICD-10-CM

## 2020-03-23 DIAGNOSIS — H04129 Dry eye syndrome of unspecified lacrimal gland: Secondary | ICD-10-CM | POA: Diagnosis not present

## 2020-03-23 DIAGNOSIS — Z1211 Encounter for screening for malignant neoplasm of colon: Secondary | ICD-10-CM | POA: Diagnosis not present

## 2020-03-23 DIAGNOSIS — E7800 Pure hypercholesterolemia, unspecified: Secondary | ICD-10-CM

## 2020-03-23 DIAGNOSIS — M2550 Pain in unspecified joint: Secondary | ICD-10-CM | POA: Diagnosis not present

## 2020-03-23 DIAGNOSIS — J449 Chronic obstructive pulmonary disease, unspecified: Secondary | ICD-10-CM | POA: Diagnosis not present

## 2020-03-23 DIAGNOSIS — G930 Cerebral cysts: Secondary | ICD-10-CM | POA: Diagnosis not present

## 2020-03-23 DIAGNOSIS — M5416 Radiculopathy, lumbar region: Secondary | ICD-10-CM

## 2020-03-23 DIAGNOSIS — M4134 Thoracogenic scoliosis, thoracic region: Secondary | ICD-10-CM | POA: Diagnosis not present

## 2020-03-23 DIAGNOSIS — E559 Vitamin D deficiency, unspecified: Secondary | ICD-10-CM | POA: Diagnosis not present

## 2020-03-23 DIAGNOSIS — Z1231 Encounter for screening mammogram for malignant neoplasm of breast: Secondary | ICD-10-CM

## 2020-03-23 DIAGNOSIS — Z1159 Encounter for screening for other viral diseases: Secondary | ICD-10-CM | POA: Diagnosis not present

## 2020-03-23 DIAGNOSIS — R739 Hyperglycemia, unspecified: Secondary | ICD-10-CM

## 2020-03-23 DIAGNOSIS — J984 Other disorders of lung: Secondary | ICD-10-CM | POA: Diagnosis not present

## 2020-03-23 DIAGNOSIS — K589 Irritable bowel syndrome without diarrhea: Secondary | ICD-10-CM

## 2020-03-23 DIAGNOSIS — R69 Illness, unspecified: Secondary | ICD-10-CM | POA: Diagnosis not present

## 2020-03-23 DIAGNOSIS — M545 Low back pain, unspecified: Secondary | ICD-10-CM | POA: Diagnosis not present

## 2020-03-23 LAB — URINALYSIS, ROUTINE W REFLEX MICROSCOPIC
Bilirubin Urine: NEGATIVE
Hgb urine dipstick: NEGATIVE
Ketones, ur: NEGATIVE
Leukocytes,Ua: NEGATIVE
Nitrite: NEGATIVE
RBC / HPF: NONE SEEN (ref 0–?)
Specific Gravity, Urine: 1.015 (ref 1.000–1.030)
Total Protein, Urine: NEGATIVE
Urine Glucose: NEGATIVE
Urobilinogen, UA: 0.2 (ref 0.0–1.0)
WBC, UA: NONE SEEN (ref 0–?)
pH: 6 (ref 5.0–8.0)

## 2020-03-23 LAB — CBC WITH DIFFERENTIAL/PLATELET
Basophils Absolute: 0 10*3/uL (ref 0.0–0.1)
Basophils Relative: 0.5 % (ref 0.0–3.0)
Eosinophils Absolute: 0.1 10*3/uL (ref 0.0–0.7)
Eosinophils Relative: 1.9 % (ref 0.0–5.0)
HCT: 36.9 % (ref 36.0–46.0)
Hemoglobin: 12.7 g/dL (ref 12.0–15.0)
Lymphocytes Relative: 38.4 % (ref 12.0–46.0)
Lymphs Abs: 2.3 10*3/uL (ref 0.7–4.0)
MCHC: 34.5 g/dL (ref 30.0–36.0)
MCV: 96.8 fl (ref 78.0–100.0)
Monocytes Absolute: 0.5 10*3/uL (ref 0.1–1.0)
Monocytes Relative: 8.5 % (ref 3.0–12.0)
Neutro Abs: 3 10*3/uL (ref 1.4–7.7)
Neutrophils Relative %: 50.7 % (ref 43.0–77.0)
Platelets: 250 10*3/uL (ref 150.0–400.0)
RBC: 3.81 Mil/uL — ABNORMAL LOW (ref 3.87–5.11)
RDW: 13 % (ref 11.5–15.5)
WBC: 5.9 10*3/uL (ref 4.0–10.5)

## 2020-03-23 LAB — BASIC METABOLIC PANEL
BUN: 9 mg/dL (ref 6–23)
CO2: 28 mEq/L (ref 19–32)
Calcium: 9.5 mg/dL (ref 8.4–10.5)
Chloride: 103 mEq/L (ref 96–112)
Creatinine, Ser: 0.6 mg/dL (ref 0.40–1.20)
GFR: 104.94 mL/min (ref >60.00)
Glucose, Bld: 82 mg/dL (ref 70–99)
Potassium: 3.8 mEq/L (ref 3.5–5.1)
Sodium: 138 mEq/L (ref 135–145)

## 2020-03-23 LAB — HEPATIC FUNCTION PANEL
ALT: 21 U/L (ref 0–35)
AST: 22 U/L (ref 0–37)
Albumin: 4 g/dL (ref 3.5–5.2)
Alkaline Phosphatase: 76 U/L (ref 39–117)
Bilirubin, Direct: 0 mg/dL (ref 0.0–0.3)
Total Bilirubin: 0.3 mg/dL (ref 0.2–1.2)
Total Protein: 7.4 g/dL (ref 6.0–8.3)

## 2020-03-23 LAB — LIPID PANEL
Cholesterol: 136 mg/dL (ref 0–200)
HDL: 63.9 mg/dL (ref >39.00)
LDL Cholesterol: 61 mg/dL (ref 0–99)
NonHDL: 71.7
Total CHOL/HDL Ratio: 2
Triglycerides: 54 mg/dL (ref 0.0–149.0)
VLDL: 10.8 mg/dL (ref 0.0–40.0)

## 2020-03-23 LAB — HEMOGLOBIN A1C: Hgb A1c MFr Bld: 5.8 % (ref 4.6–6.5)

## 2020-03-23 LAB — TSH: TSH: 0.7 u[IU]/mL (ref 0.35–4.50)

## 2020-03-23 LAB — VITAMIN B12: Vitamin B-12: 1094 pg/mL — ABNORMAL HIGH (ref 211–911)

## 2020-03-23 LAB — VITAMIN D 25 HYDROXY (VIT D DEFICIENCY, FRACTURES): VITD: 30.81 ng/mL (ref 30.00–100.00)

## 2020-03-23 IMAGING — DX DG THORACIC SPINE 2V
2 series · 2 of 2 positions shown · non-contrast
Comparison: [DATE]

CLINICAL DATA: Pain

EXAM:
THORACIC SPINE 2 VIEWS

[t-spine ap]
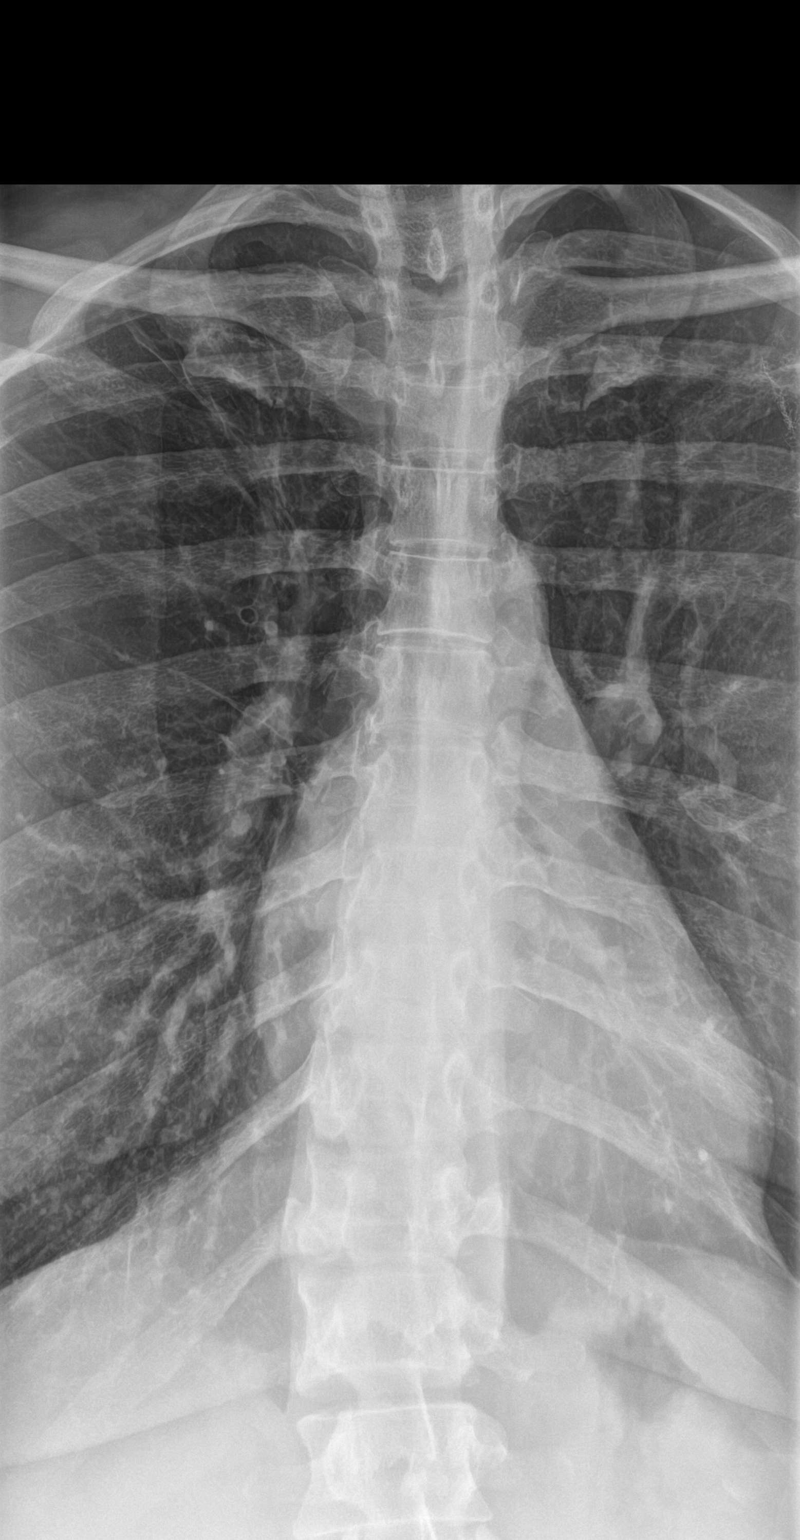

[t-spine lat]
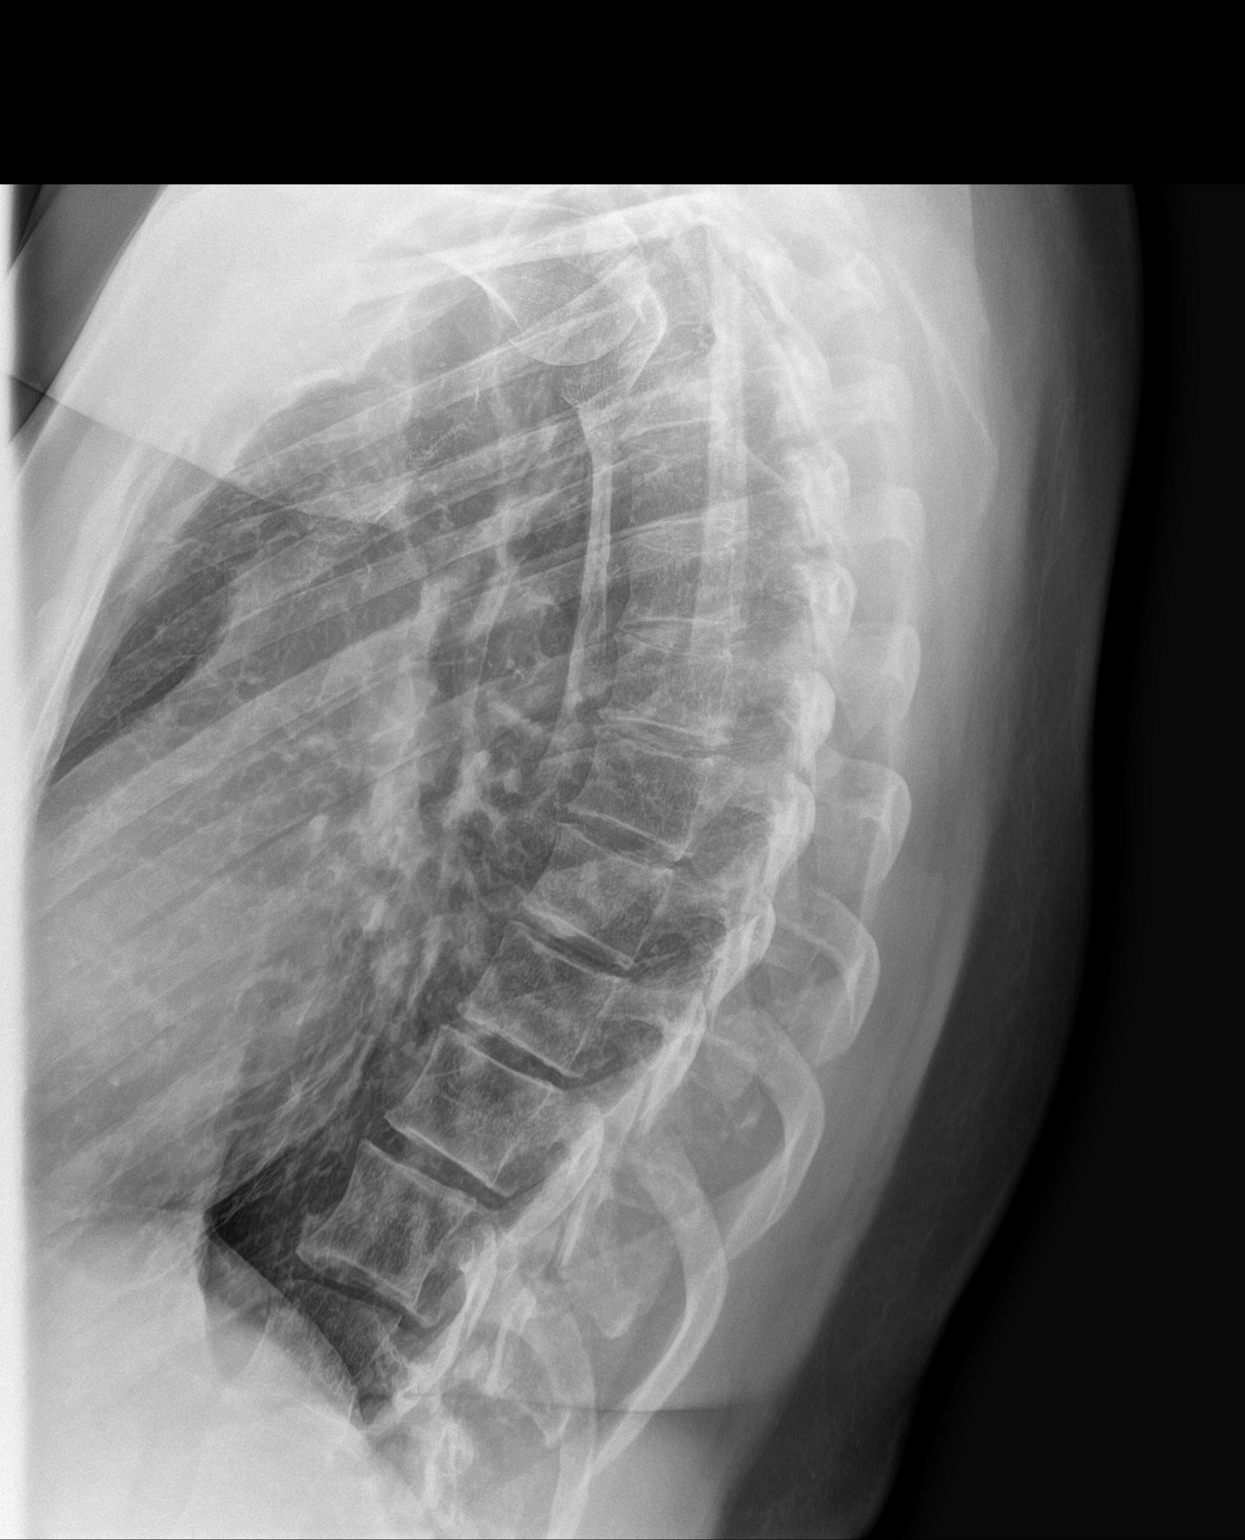

[2 of 2 positions shown; findings below may reference images not displayed]

FINDINGS: Mild scoliosis. Sagittal alignment within normal limits. Vertebral
body heights are maintained. Minimal degenerative osteophytes.
IMPRESSION: Mild scoliosis and degenerative change.

## 2020-03-23 IMAGING — DX DG CHEST 2V
2 series · 2 of 2 positions shown · non-contrast
Comparison: [DATE], [DATE]

CLINICAL DATA: Chest and back pain

EXAM:
CHEST - 2 VIEW

[chest pa]
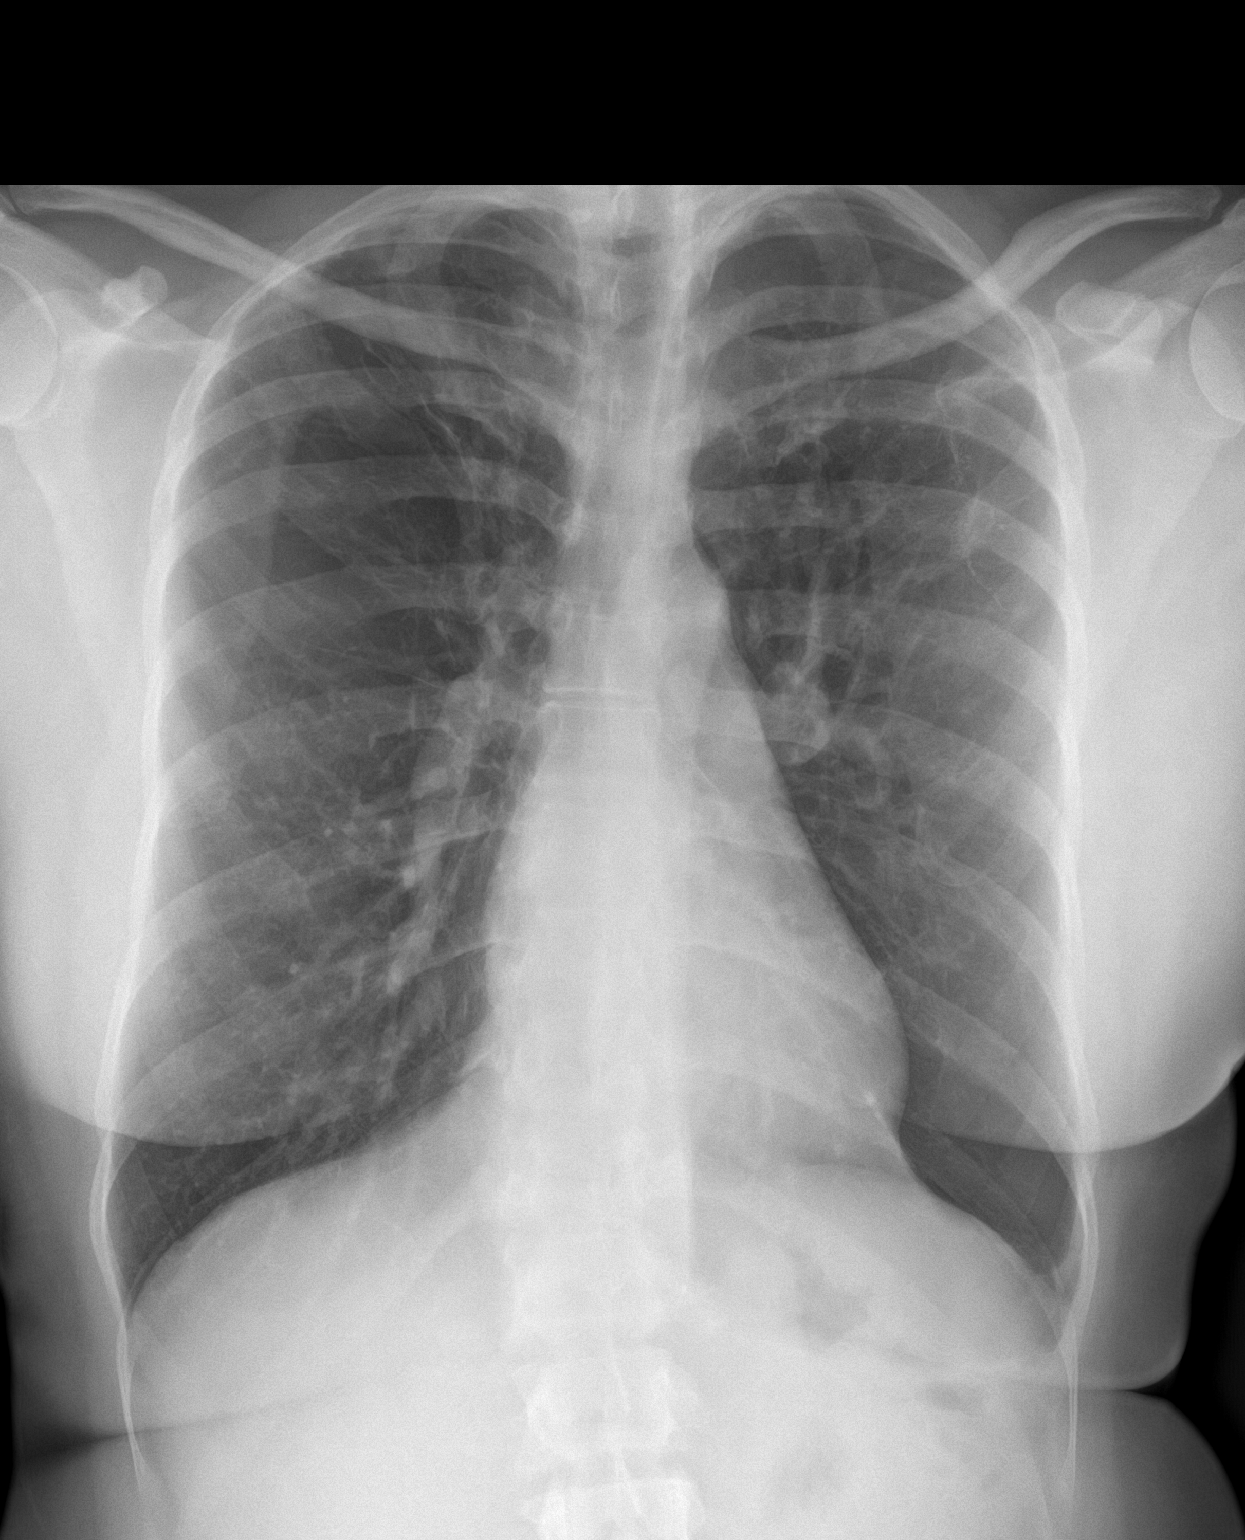

[chest lat]
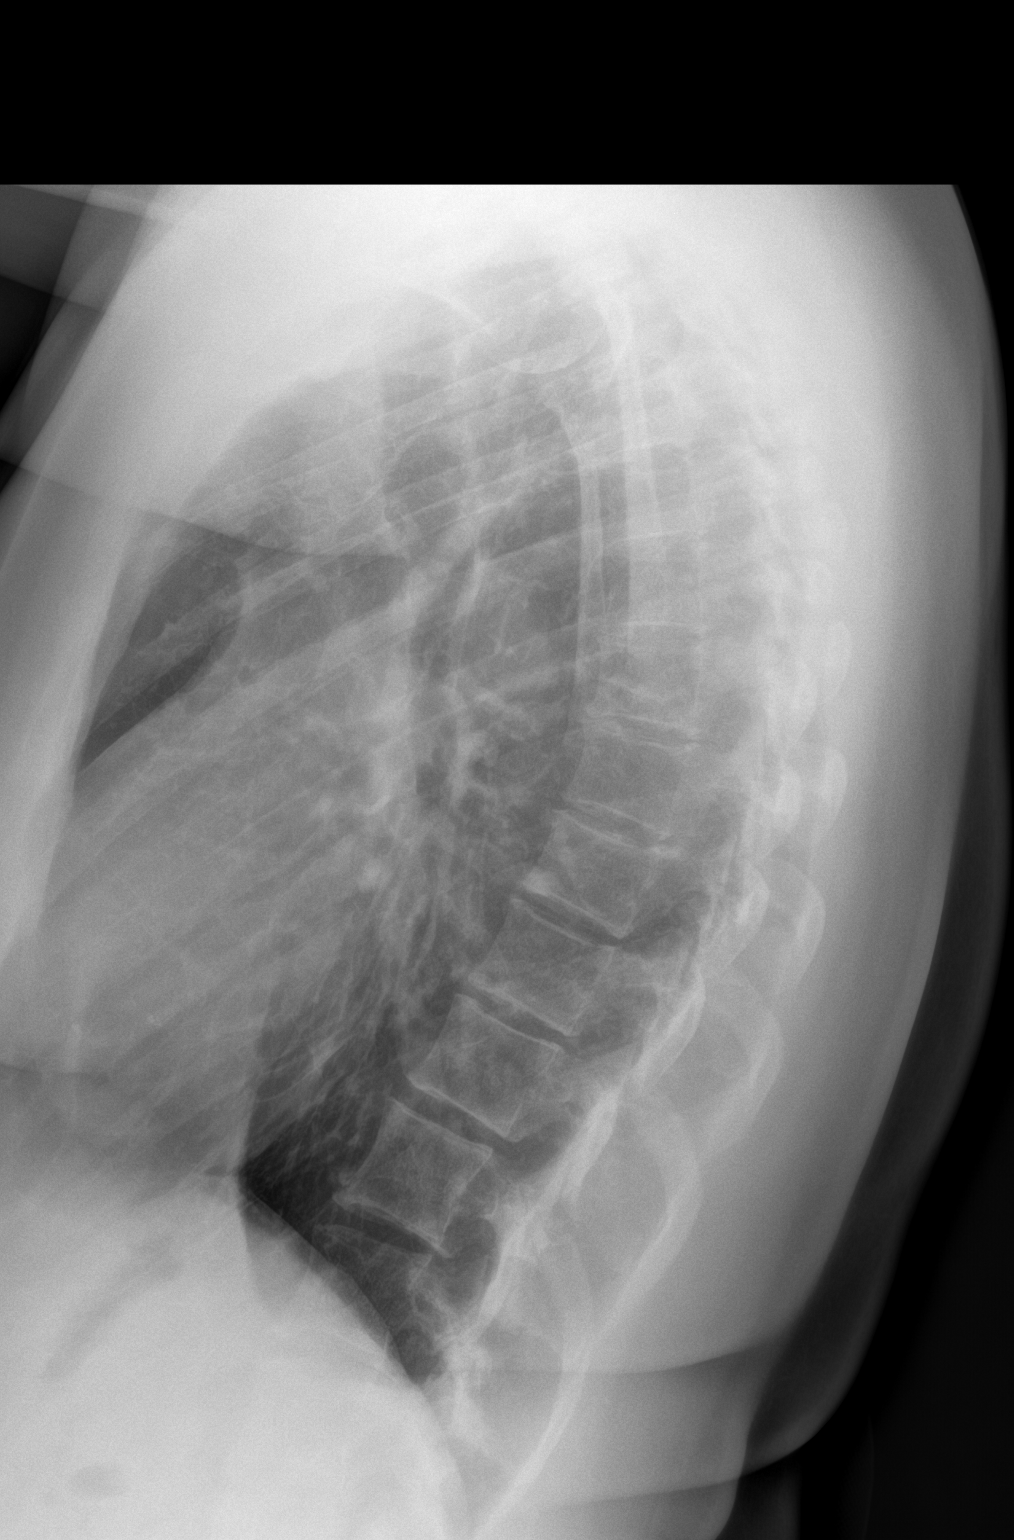

[2 of 2 positions shown; findings below may reference images not displayed]

FINDINGS: Stable scarring and postsurgical change in the left upper lobe. Mild
scarring in the right upper lobe. No acute consolidation or
effusion. Stable cardiomediastinal silhouette. No pneumothorax.
IMPRESSION: No active cardiopulmonary disease. Hyperinflation with postsurgical
changes in the left upper lung and bilateral upper lung scarring

## 2020-03-23 MED ORDER — DICYCLOMINE HCL 20 MG PO TABS
20.0000 mg | ORAL_TABLET | Freq: Four times a day (QID) | ORAL | 1 refills | Status: DC
Start: 2020-03-23 — End: 2020-08-03

## 2020-03-23 MED ORDER — CYCLOBENZAPRINE HCL 5 MG PO TABS: 5.0000 mg | ORAL_TABLET | Freq: Three times a day (TID) | ORAL | 2 refills | Status: DC | PRN

## 2020-03-23 NOTE — Assessment & Plan Note (Signed)
Most likely c/w msk pain - for cxr, t spine films, also flexeril prn,  to f/u any worsening symptoms or concerns

## 2020-03-23 NOTE — Patient Instructions (Signed)
Hannah Barron , Thank you for taking time to come for your Medicare Wellness Visit. I appreciate your ongoing commitment to your health goals. Please review the following plan we discussed and let me know if I can assist you in the future.   Screening recommendations/referrals: Colonoscopy: never done; will get scheduled by Dr. Jenny Reichmann Mammogram: 10/08/2018; need to be scheduled Bone Density: never done Recommended yearly ophthalmology/optometry visit for glaucoma screening and checkup Recommended yearly dental visit for hygiene and checkup  Vaccinations: Influenza vaccine: 10/08/2019 Pneumococcal vaccine: 11/07/2015 (Pneumnovax 23)  Tdap vaccine: can check with local pharmacy  Shingles vaccine: can check with local pharmacy Covid-19: 03/11/2019, 04/01/2019, 01/18/2020  Advanced directives: Advance directive discussed with you today. Even though you declined this today please call our office should you change your mind and we can give you the proper paperwork for you to fill out.  Conditions/risks identified: Yes; Reviewed health maintenance screenings with patient today and relevant education, vaccines, and/or referrals were provided. Please continue to do your personal lifestyle choices by: daily care of teeth and gums, regular physical activity (goal should be 5 days a week for 30 minutes), eat a healthy diet, avoid tobacco and drug use, limiting any alcohol intake, taking a low-dose aspirin (if not allergic or have been advised by your provider otherwise) and taking vitamins and minerals as recommended by your provider. Continue doing brain stimulating activities (puzzles, reading, adult coloring books, staying active) to keep memory sharp. Continue to eat heart healthy diet (full of fruits, vegetables, whole grains, lean protein, water--limit salt, fat, and sugar intake) and increase physical activity as tolerated.  Next appointment: Please schedule your next Medicare Wellness Visit with your Nurse  Health Advisor in 1 year by calling [phone].   Preventive Care 40-64 Years, Female Preventive care refers to lifestyle choices and visits with your health care provider that can promote health and wellness. What does preventive care include?  A yearly physical exam. This is also called an annual well check.  Dental exams once or twice a year.  Routine eye exams. Ask your health care provider how often you should have your eyes checked.  Personal lifestyle choices, including:  Daily care of your teeth and gums.  Regular physical activity.  Eating a healthy diet.  Avoiding tobacco and drug use.  Limiting alcohol use.  Practicing safe sex.  Taking low-dose aspirin daily starting at age 40.  Taking vitamin and mineral supplements as recommended by your health care provider. What happens during an annual well check? The services and screenings done by your health care provider during your annual well check will depend on your age, overall health, lifestyle risk factors, and family history of disease. Counseling  Your health care provider may ask you questions about your:  Alcohol use.  Tobacco use.  Drug use.  Emotional well-being.  Home and relationship well-being.  Sexual activity.  Eating habits.  Work and work Statistician.  Method of birth control.  Menstrual cycle.  Pregnancy history. Screening  You may have the following tests or measurements:  Height, weight, and BMI.  Blood pressure.  Lipid and cholesterol levels. These may be checked every 5 years, or more frequently if you are over 33 years old.  Skin check.  Lung cancer screening. You may have this screening every year starting at age 80 if you have a 30-pack-year history of smoking and currently smoke or have quit within the past 15 years.  Fecal occult blood test (FOBT) of the stool.  You may have this test every year starting at age 26.  Flexible sigmoidoscopy or colonoscopy. You may  have a sigmoidoscopy every 5 years or a colonoscopy every 10 years starting at age 39.  Hepatitis C blood test.  Hepatitis B blood test.  Sexually transmitted disease (STD) testing.  Diabetes screening. This is done by checking your blood sugar (glucose) after you have not eaten for a while (fasting). You may have this done every 1-3 years.  Mammogram. This may be done every 1-2 years. Talk to your health care provider about when you should start having regular mammograms. This may depend on whether you have a family history of breast cancer.  BRCA-related cancer screening. This may be done if you have a family history of breast, ovarian, tubal, or peritoneal cancers.  Pelvic exam and Pap test. This may be done every 3 years starting at age 54. Starting at age 38, this may be done every 5 years if you have a Pap test in combination with an HPV test.  Bone density scan. This is done to screen for osteoporosis. You may have this scan if you are at high risk for osteoporosis. Discuss your test results, treatment options, and if necessary, the need for more tests with your health care provider. Vaccines  Your health care provider may recommend certain vaccines, such as:  Influenza vaccine. This is recommended every year.  Tetanus, diphtheria, and acellular pertussis (Tdap, Td) vaccine. You may need a Td booster every 10 years.  Zoster vaccine. You may need this after age 15.  Pneumococcal 13-valent conjugate (PCV13) vaccine. You may need this if you have certain conditions and were not previously vaccinated.  Pneumococcal polysaccharide (PPSV23) vaccine. You may need one or two doses if you smoke cigarettes or if you have certain conditions. Talk to your health care provider about which screenings and vaccines you need and how often you need them. This information is not intended to replace advice given to you by your health care provider. Make sure you discuss any questions you have with  your health care provider. Document Released: 01/23/2015 Document Revised: 09/16/2015 Document Reviewed: 10/28/2014 Elsevier Interactive Patient Education  2017 [place] Prevention in the Home Falls can cause injuries. They can happen to people of all ages. There are many things you can do to make your home safe and to help prevent falls. What can I do on the outside of my home?  Regularly fix the edges of walkways and driveways and fix any cracks.  Remove anything that might make you trip as you walk through a door, such as a raised step or threshold.  Trim any bushes or trees on the path to your home.  Use bright outdoor lighting.  Clear any walking paths of anything that might make someone trip, such as rocks or tools.  Regularly check to see if handrails are loose or broken. Make sure that both sides of any steps have handrails.  Any raised decks and porches should have guardrails on the edges.  Have any leaves, snow, or ice cleared regularly.  Use sand or salt on walking paths during winter.  Clean up any spills in your garage right away. This includes oil or grease spills. What can I do in the bathroom?  Use night lights.  Install grab bars by the toilet and in the tub and shower. Do not use towel bars as grab bars.  Use non-skid mats or decals in the tub  or shower.  If you need to sit down in the shower, use a plastic, non-slip stool.  Keep the floor dry. Clean up any water that spills on the floor as soon as it happens.  Remove soap buildup in the tub or shower regularly.  Attach bath mats securely with double-sided non-slip rug tape.  Do not have throw rugs and other things on the floor that can make you trip. What can I do in the bedroom?  Use night lights.  Make sure that you have a light by your bed that is easy to reach.  Do not use any sheets or blankets that are too big for your bed. They should not hang down onto the floor.  Have a  firm chair that has side arms. You can use this for support while you get dressed.  Do not have throw rugs and other things on the floor that can make you trip. What can I do in the kitchen?  Clean up any spills right away.  Avoid walking on wet floors.  Keep items that you use a lot in easy-to-reach places.  If you need to reach something above you, use a strong step stool that has a grab bar.  Keep electrical cords out of the way.  Do not use floor polish or wax that makes floors slippery. If you must use wax, use non-skid floor wax.  Do not have throw rugs and other things on the floor that can make you trip. What can I do with my stairs?  Do not leave any items on the stairs.  Make sure that there are handrails on both sides of the stairs and use them. Fix handrails that are broken or loose. Make sure that handrails are as long as the stairways.  Check any carpeting to make sure that it is firmly attached to the stairs. Fix any carpet that is loose or worn.  Avoid having throw rugs at the top or bottom of the stairs. If you do have throw rugs, attach them to the floor with carpet tape.  Make sure that you have a light switch at the top of the stairs and the bottom of the stairs. If you do not have them, ask someone to add them for you. What else can I do to help prevent falls?  Wear shoes that:  Do not have high heels.  Have rubber bottoms.  Are comfortable and fit you well.  Are closed at the toe. Do not wear sandals.  If you use a stepladder:  Make sure that it is fully opened. Do not climb a closed stepladder.  Make sure that both sides of the stepladder are locked into place.  Ask someone to hold it for you, if possible.  Clearly mark and make sure that you can see:  Any grab bars or handrails.  First and last steps.  Where the edge of each step is.  Use tools that help you move around (mobility aids) if they are needed. These  include:  Canes.  Walkers.  Scooters.  Crutches.  Turn on the lights when you go into a dark area. Replace any light bulbs as soon as they burn out.  Set up your furniture so you have a clear path. Avoid moving your furniture around.  If any of your floors are uneven, fix them.  If there are any pets around you, be aware of where they are.  Review your medicines with your doctor. Some medicines can make you  feel dizzy. This can increase your chance of falling. Ask your doctor what other things that you can do to help prevent falls. This information is not intended to replace advice given to you by your health care provider. Make sure you discuss any questions you have with your health care provider. Document Released: 10/23/2008 Document Revised: 06/04/2015 Document Reviewed: 01/31/2014 Elsevier Interactive Patient Education  2017 [employer].

## 2020-03-23 NOTE — Assessment & Plan Note (Signed)
Lab Results  Component Value Date   LDLCALC 61 03/23/2020   Stable, pt to continue current statin lipitor 20 *  Current Outpatient Medications (Cardiovascular):  .  atorvastatin (LIPITOR) 20 MG tablet, TAKE 1 TABLET BY MOUTH EVERY DAY .  metoprolol succinate (TOPROL-XL) 50 MG 24 hr tablet, TAKE 1 TABLET IN MORNING & 1/2 TABLET IN EVENING *TAKE 1/2 TAB AS NEEDED FOR PALPITATIONS/RAPID HR*  Current Outpatient Medications (Respiratory):  .  albuterol (VENTOLIN HFA) 108 (90 Base) MCG/ACT inhaler, INHALE 2 PUFFS INTO THE LUNGS EVERY 6 HOURS AS NEEDED FOR WHEEZING OR SHORTNESS OF BREATH .  budesonide-formoterol (SYMBICORT) 160-4.5 MCG/ACT inhaler, Inhale 2 puffs into the lungs 2 (two) times daily.  Current Outpatient Medications (Analgesics):  .  naproxen (NAPROSYN) 500 MG tablet, TAKE 1 TABLET (500 MG TOTAL) BY MOUTH 2 (TWO) TIMES DAILY WITH A MEAL.   Current Outpatient Medications (Other):  .  clonazePAM (KLONOPIN) 0.5 MG tablet, Take 1 tablet (0.5 mg total) by mouth 2 (two) times daily as needed for anxiety. .  cyclobenzaprine (FLEXERIL) 5 MG tablet, Take 1 tablet (5 mg total) by mouth 3 (three) times daily as needed for muscle spasms. Marland Kitchen  omeprazole (PRILOSEC) 40 MG capsule, TAKE 1 CAPSULE BY MOUTH EVERY DAY .  Respiratory Therapy Supplies (FLUTTER) DEVI, Use as directed. .  RESTASIS 0.05 % ophthalmic emulsion, Instill 1 drop into both eyes two times a day .  dicyclomine (BENTYL) 20 MG tablet, Take 1 tablet (20 mg total) by mouth every 6 (six) hours. .  prednisoLONE acetate (PRED FORTE) 1 % ophthalmic suspension, INSTILL 1 DROP INTO BOTH EYES FOUR TIMES A DAY AS DIRECTED X 1WK THEN TWICE DAILY X 1WK

## 2020-03-23 NOTE — Progress Notes (Signed)
Subjective:   Hannah Barron is a 50 y.o. female who presents for Medicare Annual (Subsequent) preventive examination.  Review of Systems    No ROS. Medicare Wellness Visit. Additional risk factors are reflected in social history. Cardiac Risk Factors include: advanced age (>78men, >58 women);dyslipidemia     Objective:    Today's Vitals   03/23/20 1444  BP: 116/74  Pulse: 66  Temp: 98.2 F (36.8 C)  SpO2: 98%  Weight: 168 lb (76.2 kg)  Height: 5\' 7"  (1.702 m)  PainSc: 8   PainLoc: Back   Body mass index is 26.31 kg/m.  Advanced Directives 03/23/2020 09/24/2019 06/23/2017 02/17/2017 09/12/2016 04/11/2016 04/05/2016  Does Patient Have a Medical Advance Directive? No No No No No No No  Would patient like information on creating a medical advance directive? No - Patient declined - No - Patient declined No - Patient declined - No - Patient declined No - Patient declined    Current Medications (verified) Outpatient Encounter Medications as of 03/23/2020  Medication Sig  . albuterol (VENTOLIN HFA) 108 (90 Base) MCG/ACT inhaler INHALE 2 PUFFS INTO THE LUNGS EVERY 6 HOURS AS NEEDED FOR WHEEZING OR SHORTNESS OF BREATH  . atorvastatin (LIPITOR) 20 MG tablet TAKE 1 TABLET BY MOUTH EVERY DAY  . budesonide-formoterol (SYMBICORT) 160-4.5 MCG/ACT inhaler Inhale 2 puffs into the lungs 2 (two) times daily.  . clonazePAM (KLONOPIN) 0.5 MG tablet Take 1 tablet (0.5 mg total) by mouth 2 (two) times daily as needed for anxiety.  . dicyclomine (BENTYL) 20 MG tablet Take 1 tablet (20 mg total) by mouth every 6 (six) hours.  . metoprolol succinate (TOPROL-XL) 50 MG 24 hr tablet TAKE 1 TABLET IN MORNING & 1/2 TABLET IN EVENING *TAKE 1/2 TAB AS NEEDED FOR PALPITATIONS/RAPID HR*  . naproxen (NAPROSYN) 500 MG tablet TAKE 1 TABLET (500 MG TOTAL) BY MOUTH 2 (TWO) TIMES DAILY WITH A MEAL.  Marland Kitchen omeprazole (PRILOSEC) 40 MG capsule TAKE 1 CAPSULE BY MOUTH EVERY DAY  . prednisoLONE acetate (PRED FORTE) 1 % ophthalmic  suspension INSTILL 1 DROP INTO BOTH EYES FOUR TIMES A DAY AS DIRECTED X 1WK THEN TWICE DAILY X 1WK  . Respiratory Therapy Supplies (FLUTTER) DEVI Use as directed.  . RESTASIS 0.05 % ophthalmic emulsion Instill 1 drop into both eyes two times a day   No facility-administered encounter medications on file as of 03/23/2020.    Allergies (verified) Patient has no known allergies.   History: Past Medical History:  Diagnosis Date  . Anemia   . Bronchiectasis ([place])   . COPD (chronic obstructive pulmonary disease) ([place])   . GERD (gastroesophageal reflux disease)   . Migraines   . Paroxysmal SVT (supraventricular tachycardia) ([place])    a. diagnosed in 11/2015.  . Right leg numbness    Past Surgical History:  Procedure Laterality Date  . CESAREAN SECTION    . CYSTOSCOPY N/A 04/05/2016   Procedure: CYSTOSCOPY;  Surgeon: Lavonia Drafts, MD;  Location: [facility];  Service: Gynecology;  Laterality: N/A;  . LAPAROSCOPIC VAGINAL HYSTERECTOMY WITH SALPINGECTOMY Bilateral 04/05/2016   Procedure: LAPAROSCOPIC ASSISTED VAGINAL HYSTERECTOMY WITH SALPINGECTOMY;  Surgeon: Lavonia Drafts, MD;  Location: [facility];  Service: Gynecology;  Laterality: Bilateral;  . LUNG BIOPSY    . VIDEO BRONCHOSCOPY Bilateral 03/29/2016   Procedure: VIDEO BRONCHOSCOPY WITHOUT FLUORO;  Surgeon: Marshell Garfinkel, MD;  Location: [facility];  Service: Cardiopulmonary;  Laterality: Bilateral;   Family History  Problem Relation Age of Onset  . Healthy Mother   .  Healthy Father    Social History   Socioeconomic History  . Marital status: Married    Spouse name: Not on file  . Number of children: 1  . Years of education: 84  . Highest education level: Not on file  Occupational History  . Occupation: disability  Tobacco Use  . Smoking status: Never Smoker  . Smokeless tobacco: Never Used  Vaping Use  . Vaping Use: Never used  Substance and Sexual Activity  . Alcohol use: No    Alcohol/week: 0.0 standard  drinks  . Drug use: No  . Sexual activity: Yes    Birth control/protection: Surgical  Other Topics Concern  . Not on file  Social History Narrative   Fun: Watch TV   Social Determinants of Health   Financial Resource Strain: Low Risk   . Difficulty of Paying Living Expenses: Not very hard  Food Insecurity: No Food Insecurity  . Worried About Charity fundraiser in the Last Year: Never true  . Ran Out of Food in the Last Year: Never true  Transportation Needs: No Transportation Needs  . Lack of Transportation (Medical): No  . Lack of Transportation (Non-Medical): No  Physical Activity: Not on file  Stress: No Stress Concern Present  . Feeling of Stress : Not at all  Social Connections: Moderately Integrated  . Frequency of Communication with Friends and Family: More than three times a week  . Frequency of Social Gatherings with Friends and Family: Once a week  . Attends Religious Services: 1 to 4 times per year  . Active Member of Clubs or Organizations: No  . Attends Archivist Meetings: 1 to 4 times per year  . Marital Status: Separated    Tobacco Counseling Counseling given: Not Answered   Clinical Intake:  Pre-visit preparation completed: Yes  Pain : 0-10 Pain Score: 8  Pain Type: Acute pain Pain Location: Back Pain Orientation: Right,Left Pain Descriptors / Indicators: Aching,Tightness,Constant,Discomfort,Throbbing Pain Onset: 1 to 4 weeks ago Pain Frequency: Constant Pain Relieving Factors: none; having xrays done today Effect of Pain on Daily Activities: Pain produces disability and affects the quality of life.  Pain Relieving Factors: none; having xrays done today  BMI - recorded: 26.31 Nutritional Status: BMI 25 -29 Overweight Nutritional Risks: None Diabetes: No  How often do you need to have someone help you when you read instructions, pamphlets, or other written materials from your doctor or pharmacy?: 1 - Never What is the last grade  level you completed in school?: High School Graduate  Diabetic? no  Interpreter Needed?: No  Information entered by :: Lisette Abu, LPN.   Activities of Daily Living In your present state of health, do you have any difficulty performing the following activities: 03/23/2020  Hearing? N  Vision? Y  Comment issues with dry eyes; uses eye drops  Difficulty concentrating or making decisions? N  Walking or climbing stairs? N  Dressing or bathing? N  Doing errands, shopping? N  Preparing Food and eating ? N  Using the Toilet? N  In the past six months, have you accidently leaked urine? N  Do you have problems with loss of bowel control? N  Managing your Medications? N  Managing your Finances? N  Housekeeping or managing your Housekeeping? N  Some recent data might be hidden    Patient Care Team: Biagio Borg, MD as PCP - General (Internal Medicine) [place], Peter M, MD as PCP - Cardiology (Cardiology)  Indicate any recent Medical  Services you may have received from other than [facility] providers in the past year (date may be approximate).     Assessment:   This is a routine wellness examination for Hannah Barron.  Hearing/Vision screen No exam data present  Dietary issues and exercise activities discussed: Current Exercise Habits: Home exercise routine, Type of exercise: walking;stretching;strength training/weights;treadmill;Other - see comments (goes to the gym at her apartment complex), Time (Minutes): 30, Frequency (Times/Week): 5, Weekly Exercise (Minutes/Week): 150, Intensity: Mild, Exercise limited by: psychological condition(s);respiratory conditions(s);orthopedic condition(s)  Goals    . Patient Stated     Use relaxation techniques to help with breathing. Enjoy life and family.      Depression Screen PHQ 2/9 Scores 03/23/2020 04/30/2019 04/30/2019 10/01/2018 09/05/2017 07/25/2017 07/25/2017  PHQ - 2 Score 0 0 2 0 0 0 0  PHQ- 9 Score - - 4 - 2 2 0    Fall Risk Fall Risk   03/23/2020 04/30/2019 04/30/2019 10/01/2018 06/23/2017  Falls in the past year? 0 0 0 0 No  Number falls in past yr: 0 - 0 - -  Injury with Fall? 0 - 0 - -  Risk for fall due to : No Fall Risks - No Fall Risks - -  Follow up Falls evaluation completed - - - -    FALL RISK PREVENTION PERTAINING TO THE HOME:  Any stairs in or around the home? Yes  If so, are there any without handrails? No  Home free of loose throw rugs in walkways, pet beds, electrical cords, etc? Yes  Adequate lighting in your home to reduce risk of falls? Yes   ASSISTIVE DEVICES UTILIZED TO PREVENT FALLS:  Life alert? No  Use of a cane, walker or w/c? No  Grab bars in the bathroom? No  Shower chair or bench in shower? No  Elevated toilet seat or a handicapped toilet? No   TIMED UP AND GO:  Was the test performed? No .  Length of time to ambulate 10 feet: 0 sec.   Gait steady and fast without use of assistive device  Cognitive Function: Normal cognitive status assessed by direct observation by this Nurse Health Advisor. No abnormalities found.          Immunizations Immunization History  Administered Date(s) Administered  . Influenza,inj,Quad PF,6+ Mos 11/07/2015, 10/26/2016, 09/20/2017, 09/03/2018, 10/08/2019  . PFIZER(Purple Top)SARS-COV-2 Vaccination 03/11/2019, 04/01/2019, 01/18/2020  . Pneumococcal Polysaccharide-23 11/07/2015    TDAP status: Due, Education has been provided regarding the importance of this vaccine. Advised may receive this vaccine at local pharmacy or Health Dept. Aware to provide a copy of the vaccination record if obtained from local pharmacy or Health Dept. Verbalized acceptance and understanding.  Flu Vaccine status: Up to date  Pneumococcal vaccine status: Due, Education has been provided regarding the importance of this vaccine. Advised may receive this vaccine at local pharmacy or Health Dept. Aware to provide a copy of the vaccination record if obtained from local pharmacy or  Health Dept. Verbalized acceptance and understanding.  Covid-19 vaccine status: Completed vaccines  Qualifies for Shingles Vaccine? Yes   Zostavax completed No   Shingrix Completed?: No.    Education has been provided regarding the importance of this vaccine. Patient has been advised to call insurance company to determine out of pocket expense if they have not yet received this vaccine. Advised may also receive vaccine at local pharmacy or Health Dept. Verbalized acceptance and understanding.  Screening Tests Health Maintenance  Topic Date Due  . Hepatitis  C Screening  Never done  . COLONOSCOPY (Pts 45-22yrs Insurance coverage will need to be confirmed)  Never done  . PAP SMEAR-Modifier  03/23/2020 (Originally 04/02/2019)  . MAMMOGRAM  10/07/2020  . TETANUS/TDAP  11/10/2024  . INFLUENZA VACCINE  Completed  . COVID-19 Vaccine  Completed  . HIV Screening  Completed  . HPV VACCINES  Aged Out    Health Maintenance  Health Maintenance Due  Topic Date Due  . Hepatitis C Screening  Never done  . COLONOSCOPY (Pts 45-44yrs Insurance coverage will need to be confirmed)  Never done    Colorectal cancer screening: Referral to GI placed 03/23/2020. Pt aware the office will call re: appt.  Mammogram status: Completed 10/08/2018. Repeat every year  Bone density status: never done  Lung Cancer Screening: (Low Dose CT Chest recommended if Age 36-80 years, 30 pack-year currently smoking OR have quit w/in 15years.) does not qualify.   Lung Cancer Screening Referral: no  Additional Screening:  Hepatitis C Screening: does not qualify; Completed no  Vision Screening: Recommended annual ophthalmology exams for early detection of glaucoma and other disorders of the eye. Is the patient up to date with their annual eye exam?  Yes  Who is the provider or what is the name of the office in which the patient attends annual eye exams? [facility] If pt is not established with a provider, would they  like to be referred to a provider to establish care? No .   Dental Screening: Recommended annual dental exams for proper oral hygiene  Community Resource Referral / Chronic Care Management: CRR required this visit?  No   CCM required this visit?  No      Plan:     I have personally reviewed and noted the following in the patient's chart:   . Medical and social history . Use of alcohol, tobacco or illicit drugs  . Current medications and supplements . Functional ability and status . Nutritional status . Physical activity . Advanced directives . List of other physicians . Hospitalizations, surgeries, and ER visits in previous 12 months . Vitals . Screenings to include cognitive, depression, and falls . Referrals and appointments  In addition, I have reviewed and discussed with patient certain preventive protocols, quality metrics, and best practice recommendations. A written personalized care plan for preventive services as well as general preventive health recommendations were provided to patient.     Sheral Flow, LPN   [id number]/57/[id number]   Nurse Notes:  Medications reviewed with patient; no opioid use noted.

## 2020-03-23 NOTE — Assessment & Plan Note (Signed)
Age and sex appropriate education and counseling updated with regular exercise and diet Referrals for preventative services - none needed except for hep c screen, refer colonoscopy, and pt to call for gyn/pap Immunizations addressed - none needed Smoking counseling  - none needed Evidence for depression or other mood disorder - none significant Most recent labs reviewed. I have personally reviewed and have noted: 1) the patient's medical and social history 2) The patient's current medications and supplements 3) The patient's height, weight, and BMI have been recorded in the chart

## 2020-03-23 NOTE — Patient Instructions (Signed)
Please take all new medication as prescribed  - the generic muscle relaxer as needed  Please continue all other medications as before, and refills have been done if requested.  Please have the pharmacy call with any other refills you may need.  Please continue your efforts at being more active, low cholesterol diet, and weight control.  You are otherwise up to date with prevention measures today.  Please keep your appointments with your specialists as you may have planned  You will be contacted regarding the referral for: MRI for the lumbar back, and Neurosurgury referral  Please go to the XRAY Department in the first floor for the x-ray testing  Please go to the LAB at the blood drawing area for the tests to be done  You will be contacted by phone if any changes need to be made immediately.  Otherwise, you will receive a letter about your results with an explanation, but please check with MyChart first.  Please remember to sign up for MyChart if you have not done so, as this will be important to you in the future with finding out test results, communicating by private email, and scheduling acute appointments online when needed.  Please make an Appointment to return in 6 months, or sooner if needed

## 2020-03-23 NOTE — Assessment & Plan Note (Signed)
Lab Results  Component Value Date   HGBA1C 5.8 03/23/2020   Stable, pt to continue current medical treatment  - diet and wt control

## 2020-03-23 NOTE — Progress Notes (Signed)
Patient ID: Hannah Barron, female   DOB: 01/05/1971, 50 y.o.   MRN: [id number]         Chief Complaint:: wellness exam and Back Pain   IBS, right lumbar radiculopathy, hld, brain cyst       HPI:  Hannah Barron is a 50 y.o. female here for wellness exam; due for hep c screen, and ok for colonoscopy; pt to call for her GYN for pap                        Also with c/o 2 mo intermittent mild to mod bilateral mid back pain but not midline mid back pain, mild to mod, stabbing like at times, worse with moving and twisting about sometimes, but not clearly pleuritic, positional or exertional; and usually seems to be the worst after sitting in a chair after 1 hr such as with working at a desk.  Denies worsening depressive symptoms, suicidal ideation, or panic; has ongoing anxiety.  Pt denies other chest pain, increased sob or doe, wheezing, orthopnea, PND, increased LE swelling, palpitations, dizziness or syncope.   Pt denies polydipsia, polyuria.  Trying to follow a lower cholesterol diet.   Also almost as an aside, pt c/o mild worsening acute on chronic midline lumbar pain worse to the right with now 2 mo ongoing right leg numbness such that she cant feel when she pinches it.  Also Denies worsening reflux, abd pain, dysphagia, n/v, bowel change or blood, and asks for bentyl refill.  Also mentions a known brain cyst but then declines MRI or neurology f/u for now.  Denies other focal neuro s/s.      Wt Readings from Last 3 Encounters:  03/23/20 168 lb (76.2 kg)  03/23/20 168 lb (76.2 kg)  02/24/20 163 lb (73.9 kg)   BP Readings from Last 3 Encounters:  03/23/20 116/74  03/23/20 116/74  02/24/20 118/76   Immunization History  Administered Date(s) Administered  . Influenza,inj,Quad PF,6+ Mos 11/07/2015, 10/26/2016, 09/20/2017, 09/03/2018, 10/08/2019  . PFIZER(Purple Top)SARS-COV-2 Vaccination 03/11/2019, 04/01/2019, 01/18/2020  . Pneumococcal Polysaccharide-23 11/07/2015   Health Maintenance Due  Topic  Date Due  . Hepatitis C Screening  Never done  . COLONOSCOPY (Pts 45-64yrs Insurance coverage will need to be confirmed)  Never done      Past Medical History:  Diagnosis Date  . Anemia   . Bronchiectasis ([place])   . COPD (chronic obstructive pulmonary disease) ([place])   . GERD (gastroesophageal reflux disease)   . Migraines   . Paroxysmal SVT (supraventricular tachycardia) ([place])    a. diagnosed in 11/2015.  . Right leg numbness    Past Surgical History:  Procedure Laterality Date  . CESAREAN SECTION    . CYSTOSCOPY N/A 04/05/2016   Procedure: CYSTOSCOPY;  Surgeon: Lavonia Drafts, MD;  Location: [facility];  Service: Gynecology;  Laterality: N/A;  . LAPAROSCOPIC VAGINAL HYSTERECTOMY WITH SALPINGECTOMY Bilateral 04/05/2016   Procedure: LAPAROSCOPIC ASSISTED VAGINAL HYSTERECTOMY WITH SALPINGECTOMY;  Surgeon: Lavonia Drafts, MD;  Location: [facility];  Service: Gynecology;  Laterality: Bilateral;  . LUNG BIOPSY    . VIDEO BRONCHOSCOPY Bilateral 03/29/2016   Procedure: VIDEO BRONCHOSCOPY WITHOUT FLUORO;  Surgeon: Marshell Garfinkel, MD;  Location: [facility];  Service: Cardiopulmonary;  Laterality: Bilateral;    reports that she has never smoked. She has never used smokeless tobacco. She reports that she does not drink alcohol and does not use drugs. family history includes Healthy in her father and mother. No Known Allergies  Current Outpatient Medications on File Prior to Visit  Medication Sig Dispense Refill  . albuterol (VENTOLIN HFA) 108 (90 Base) MCG/ACT inhaler INHALE 2 PUFFS INTO THE LUNGS EVERY 6 HOURS AS NEEDED FOR WHEEZING OR SHORTNESS OF BREATH 54 each 3  . atorvastatin (LIPITOR) 20 MG tablet TAKE 1 TABLET BY MOUTH EVERY DAY 90 tablet 3  . budesonide-formoterol (SYMBICORT) 160-4.5 MCG/ACT inhaler Inhale 2 puffs into the lungs 2 (two) times daily. 1 each 5  . clonazePAM (KLONOPIN) 0.5 MG tablet Take 1 tablet (0.5 mg total) by mouth 2 (two) times daily as needed for anxiety.  20 tablet 1  . metoprolol succinate (TOPROL-XL) 50 MG 24 hr tablet TAKE 1 TABLET IN MORNING & 1/2 TABLET IN EVENING *TAKE 1/2 TAB AS NEEDED FOR PALPITATIONS/RAPID HR* 180 tablet 3  . naproxen (NAPROSYN) 500 MG tablet TAKE 1 TABLET (500 MG TOTAL) BY MOUTH 2 (TWO) TIMES DAILY WITH A MEAL. 60 tablet 3  . omeprazole (PRILOSEC) 40 MG capsule TAKE 1 CAPSULE BY MOUTH EVERY DAY 90 capsule 3  . Respiratory Therapy Supplies (FLUTTER) DEVI Use as directed. 1 each 0  . RESTASIS 0.05 % ophthalmic emulsion Instill 1 drop into both eyes two times a day  2  . prednisoLONE acetate (PRED FORTE) 1 % ophthalmic suspension INSTILL 1 DROP INTO BOTH EYES FOUR TIMES A DAY AS DIRECTED X 1WK THEN TWICE DAILY X 1WK     No current facility-administered medications on file prior to visit.        ROS:  All others reviewed and negative.  Objective        PE:  BP 116/74   Pulse 66   Temp 98.2 F (36.8 C) (Oral)   Ht 5\' 7"  (1.702 m)   Wt 168 lb (76.2 kg)   LMP 03/31/2016   SpO2 98%   BMI 26.31 kg/m                 Constitutional: Pt appears in NAD               HENT: Head: NCAT.                Right Ear: External ear normal.                 Left Ear: External ear normal.                Eyes: . Pupils are equal, round, and reactive to light. Conjunctivae and EOM are normal               Nose: without d/c or deformity               Neck: Neck supple. Gross normal ROM               Cardiovascular: Normal rate and regular rhythm.                 Pulmonary/Chest: Effort normal and breath sounds without rales or wheezing.                Abd:  Soft, NT, ND, + BS, no organomegaly               Neurological: Pt is alert. At baseline orientation, motor grossly intact, and RLE decreased sens to LT               Skin: Skin is warm. No rashes, no other new lesions, LE edema - none  Psychiatric: Pt behavior is normal without agitation   Micro: none  Cardiac tracings I have personally interpreted today:   none  Pertinent Radiological findings (summarize): June 28, 2019 Ls spine films IMPRESSION: Negative radiographs of the lumbar spine.   Lab Results  Component Value Date   WBC 5.9 03/23/2020   HGB 12.7 03/23/2020   HCT 36.9 03/23/2020   PLT 250.0 03/23/2020   GLUCOSE 82 03/23/2020   CHOL 136 03/23/2020   TRIG 54.0 03/23/2020   HDL 63.90 03/23/2020   LDLCALC 61 03/23/2020   ALT 21 03/23/2020   AST 22 03/23/2020   NA 138 03/23/2020   K 3.8 03/23/2020   CL 103 03/23/2020   CREATININE 0.60 03/23/2020   BUN 9 03/23/2020   CO2 28 03/23/2020   TSH 0.70 03/23/2020   INR 1.1 10/27/2014   HGBA1C 5.8 03/23/2020   Assessment/Plan:  Hannah Barron is a 50 y.o. Other or two or more races [6] female with  has a past medical history of Anemia, Bronchiectasis ([place]), COPD (chronic obstructive pulmonary disease) ([place]), GERD (gastroesophageal reflux disease), Migraines, Paroxysmal SVT (supraventricular tachycardia) ([place]), and Right leg numbness.  Encounter for well adult exam with abnormal findings Age and sex appropriate education and counseling updated with regular exercise and diet Referrals for preventative services - none needed except for hep c screen, refer colonoscopy, and pt to call for gyn/pap Immunizations addressed - none needed Smoking counseling  - none needed Evidence for depression or other mood disorder - none significant Most recent labs reviewed. I have personally reviewed and have noted: 1) the patient's medical and social history 2) The patient's current medications and supplements 3) The patient's height, weight, and BMI have been recorded in the chart   Brain cyst Declines f/u head MRI or neurology for now given other issues  HLD (hyperlipidemia) Lab Results  Component Value Date   [place] 61 03/23/2020   Stable, pt to continue current statin lipitor 20 *  Current Outpatient Medications (Cardiovascular):  .  atorvastatin (LIPITOR) 20 MG tablet, TAKE 1 TABLET  BY MOUTH EVERY DAY .  metoprolol succinate (TOPROL-XL) 50 MG 24 hr tablet, TAKE 1 TABLET IN MORNING & 1/2 TABLET IN EVENING *TAKE 1/2 TAB AS NEEDED FOR PALPITATIONS/RAPID HR*  Current Outpatient Medications (Respiratory):  .  albuterol (VENTOLIN HFA) 108 (90 Base) MCG/ACT inhaler, INHALE 2 PUFFS INTO THE LUNGS EVERY 6 HOURS AS NEEDED FOR WHEEZING OR SHORTNESS OF BREATH .  budesonide-formoterol (SYMBICORT) 160-4.5 MCG/ACT inhaler, Inhale 2 puffs into the lungs 2 (two) times daily.  Current Outpatient Medications (Analgesics):  .  naproxen (NAPROSYN) 500 MG tablet, TAKE 1 TABLET (500 MG TOTAL) BY MOUTH 2 (TWO) TIMES DAILY WITH A MEAL.   Current Outpatient Medications (Other):  .  clonazePAM (KLONOPIN) 0.5 MG tablet, Take 1 tablet (0.5 mg total) by mouth 2 (two) times daily as needed for anxiety. .  cyclobenzaprine (FLEXERIL) 5 MG tablet, Take 1 tablet (5 mg total) by mouth 3 (three) times daily as needed for muscle spasms. Marland Kitchen  omeprazole (PRILOSEC) 40 MG capsule, TAKE 1 CAPSULE BY MOUTH EVERY DAY .  Respiratory Therapy Supplies (FLUTTER) DEVI, Use as directed. .  RESTASIS 0.05 % ophthalmic emulsion, Instill 1 drop into both eyes two times a day .  dicyclomine (BENTYL) 20 MG tablet, Take 1 tablet (20 mg total) by mouth every 6 (six) hours. .  prednisoLONE acetate (PRED FORTE) 1 % ophthalmic suspension, INSTILL 1 DROP INTO BOTH EYES FOUR TIMES A DAY AS  DIRECTED X 1WK THEN TWICE DAILY X 1WK   Hyperglycemia Lab Results  Component Value Date   HGBA1C 5.8 03/23/2020   Stable, pt to continue current medical treatment  - diet and wt control   Right lumbar radiculopathy With 2 mo recent worsening, now for MRI LS spine, NS referral  Vitamin D deficiency Last vitamin D Lab Results  Component Value Date   VD25OH 30.81 03/23/2020   Low normal, to start 2000 u oral replacement  Non-traumatic mid back pain Most likely c/w msk pain - for cxr, t spine films, also flexeril prn,  to f/u any  worsening symptoms or concerns  IBS (irritable bowel syndrome) Ok for bentyl refill prn  Followup: Return in about 6 months (around 09/23/2020).  Cathlean Cower, MD 03/23/2020 9:19 PM [facility]

## 2020-03-23 NOTE — Assessment & Plan Note (Signed)
Ok for bentyl refill prn

## 2020-03-23 NOTE — Assessment & Plan Note (Signed)
Declines f/u head MRI or neurology for now given other issues

## 2020-03-23 NOTE — Assessment & Plan Note (Signed)
Last vitamin D Lab Results  Component Value Date   VD25OH 30.81 03/23/2020   Low normal, to start 2000 u oral replacement

## 2020-03-23 NOTE — Assessment & Plan Note (Signed)
With 2 mo recent worsening, now for MRI LS spine, NS referral

## 2020-03-24 ENCOUNTER — Encounter: Payer: Self-pay | Admitting: Internal Medicine

## 2020-03-24 LAB — HEPATITIS C ANTIBODY
Hepatitis C Ab: NONREACTIVE
SIGNAL TO CUT-OFF: 0.01 (ref <1.00)

## 2020-03-30 ENCOUNTER — Telehealth: Payer: Self-pay | Admitting: Internal Medicine

## 2020-03-30 DIAGNOSIS — M5416 Radiculopathy, lumbar region: Secondary | ICD-10-CM

## 2020-03-30 NOTE — Telephone Encounter (Signed)
Noted today  Cleta Alberts, MD Dr Leodis [place] denied MRI   Imaging requires six weeks of provider directed treatment to be completed. Supported  treatments include (but are not limited to) drugs for swelling or pain, an in office workout  (physical therapy), and/or oral or injected steroids. This must have been completed in the  past three months without improved symptoms. Contact (via office visit, phone, email, or  messaging) must occur after the treatment is completed. This has not been met because:  The provider directed treatment did not occur within the last three months.

## 2020-04-06 ENCOUNTER — Ambulatory Visit (HOSPITAL_BASED_OUTPATIENT_CLINIC_OR_DEPARTMENT_OTHER): Payer: Medicare HMO | Admitting: Radiology

## 2020-04-07 NOTE — Telephone Encounter (Signed)
   Patient calling to discuss  Imaging results and next plan of care since MRI denied

## 2020-04-07 NOTE — Telephone Encounter (Signed)
Ok to refer to sport medicine for further evaluation - I will refer

## 2020-04-07 NOTE — Addendum Note (Signed)
Addended by: Biagio Borg on: 04/07/2020 08:50 PM   Modules accepted: Orders

## 2020-04-08 NOTE — Telephone Encounter (Signed)
Pt notified via voicemail that the referral for sports meds was put in

## 2020-04-09 ENCOUNTER — Other Ambulatory Visit: Payer: Medicare HMO

## 2020-04-13 ENCOUNTER — Ambulatory Visit (HOSPITAL_BASED_OUTPATIENT_CLINIC_OR_DEPARTMENT_OTHER)
Admission: RE | Admit: 2020-04-13 | Discharge: 2020-04-13 | Disposition: A | Discharge status: Home / Self Care | Payer: Medicare HMO | Source: Ambulatory Visit | Attending: Internal Medicine | Admitting: Internal Medicine

## 2020-04-13 ENCOUNTER — Ambulatory Visit: Payer: Medicare HMO | Admitting: Family Medicine

## 2020-04-13 ENCOUNTER — Ambulatory Visit (HOSPITAL_BASED_OUTPATIENT_CLINIC_OR_DEPARTMENT_OTHER): Payer: Medicare HMO | Admitting: Radiology

## 2020-04-13 ENCOUNTER — Other Ambulatory Visit: Payer: Self-pay

## 2020-04-13 ENCOUNTER — Encounter: Payer: Self-pay | Admitting: Family Medicine

## 2020-04-13 VITALS — BP 97/61 | HR 67 | Ht 67.0 in | Wt 164.8 lb

## 2020-04-13 DIAGNOSIS — M546 Pain in thoracic spine: Secondary | ICD-10-CM

## 2020-04-13 DIAGNOSIS — Z1231 Encounter for screening mammogram for malignant neoplasm of breast: Secondary | ICD-10-CM

## 2020-04-13 DIAGNOSIS — G8929 Other chronic pain: Secondary | ICD-10-CM

## 2020-04-13 DIAGNOSIS — M5442 Lumbago with sciatica, left side: Secondary | ICD-10-CM

## 2020-04-13 DIAGNOSIS — M5441 Lumbago with sciatica, right side: Secondary | ICD-10-CM

## 2020-04-13 IMAGING — MG MM DIGITAL SCREENING BILAT W/ TOMO AND CAD
8 series · 9 of 24 positions shown · non-contrast
Comparison: Previous exam(s).

CLINICAL DATA: Screening.

EXAM:
DIGITAL SCREENING BILATERAL MAMMOGRAM WITH TOMOSYNTHESIS AND CAD
TECHNIQUE: Bilateral screening digital craniocaudal and mediolateral oblique
mammograms were obtained. Bilateral screening digital breast
tomosynthesis was performed. The images were evaluated with
computer-aided detection.

[R MLO synth-2D]
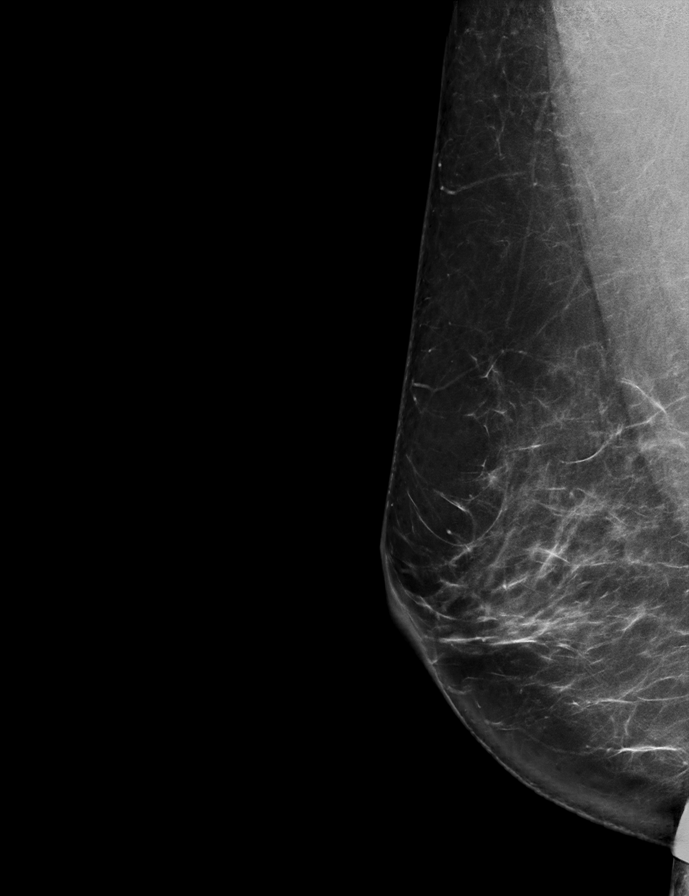

[R CC synth-2D]
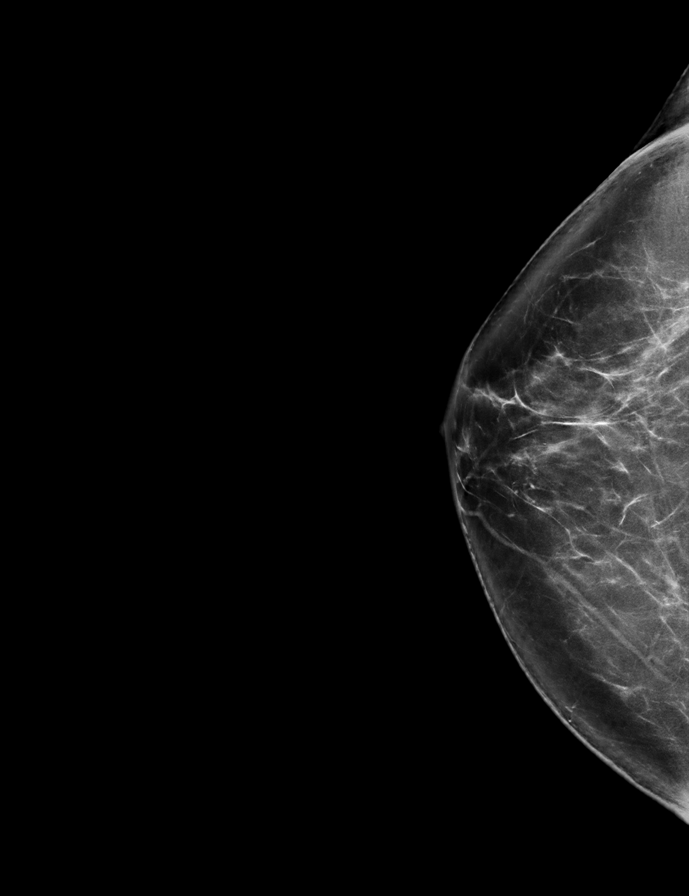

[L CC synth-2D]
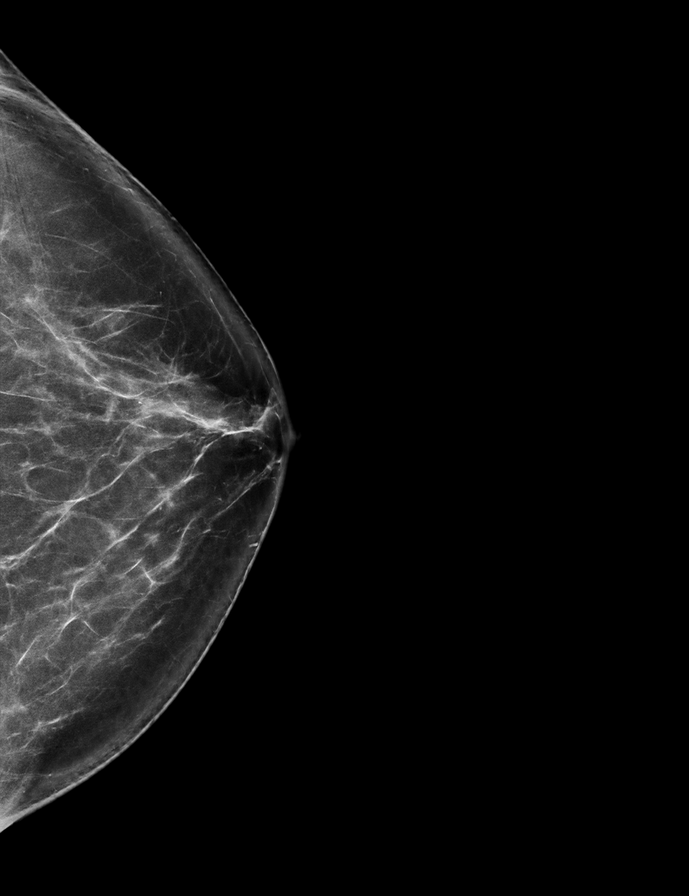

[L MLO synth-2D]
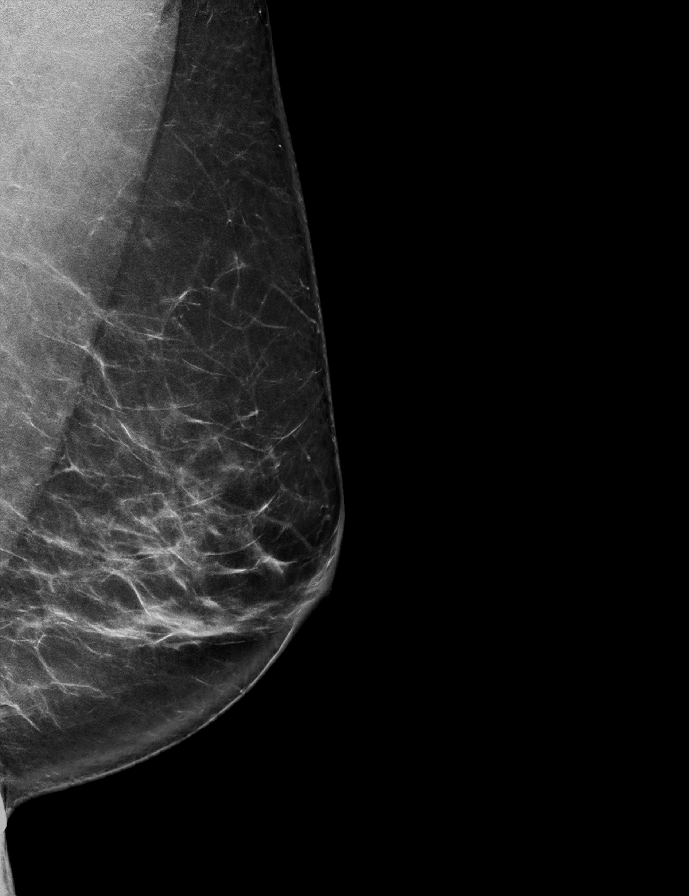

[R CC tomo · 2 of 78 frames shown]
[frame 26/78]
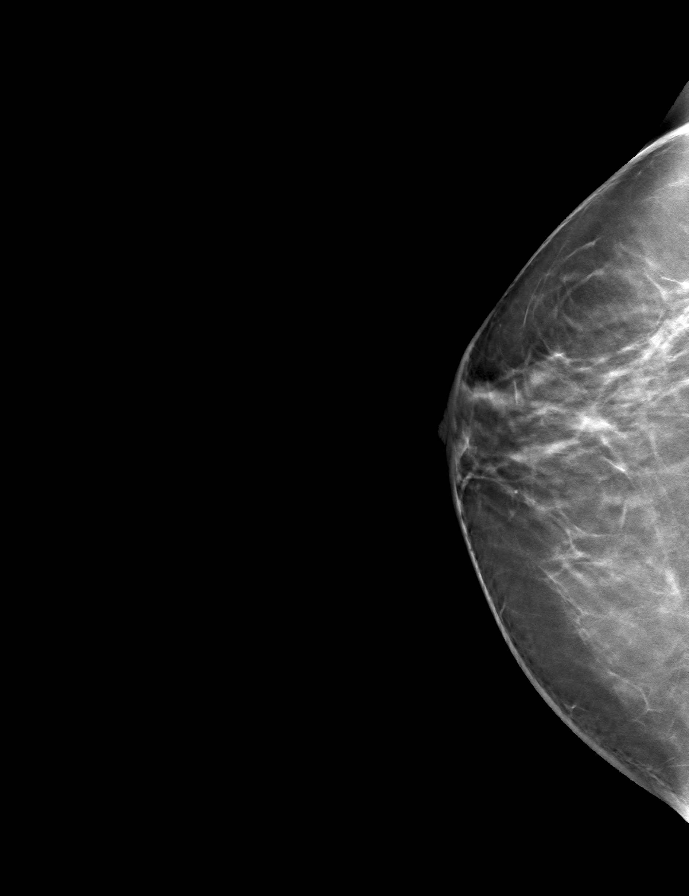
[frame 39/78]
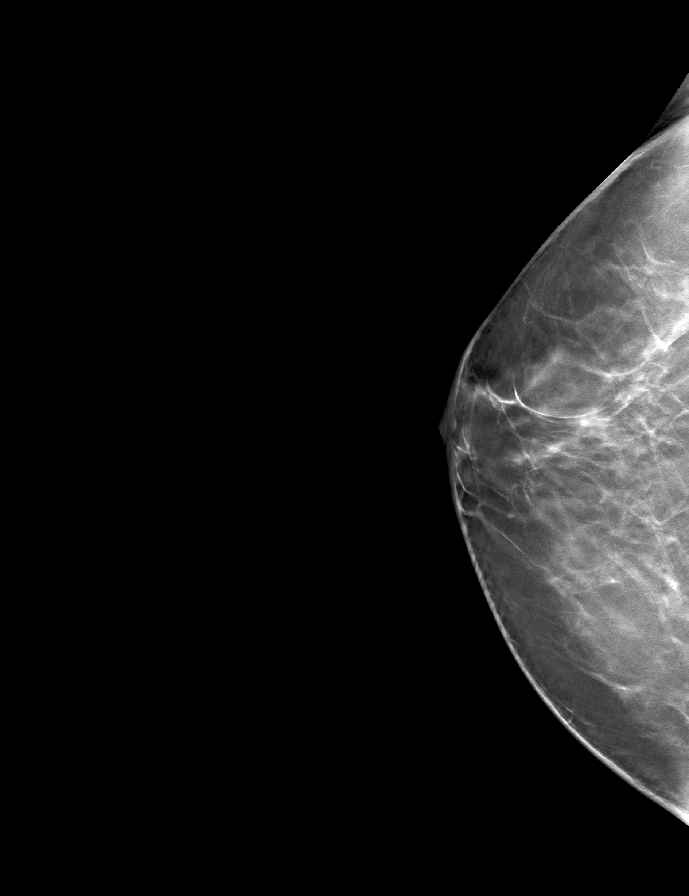

[L CC tomo · tomo slice 37/73.0]
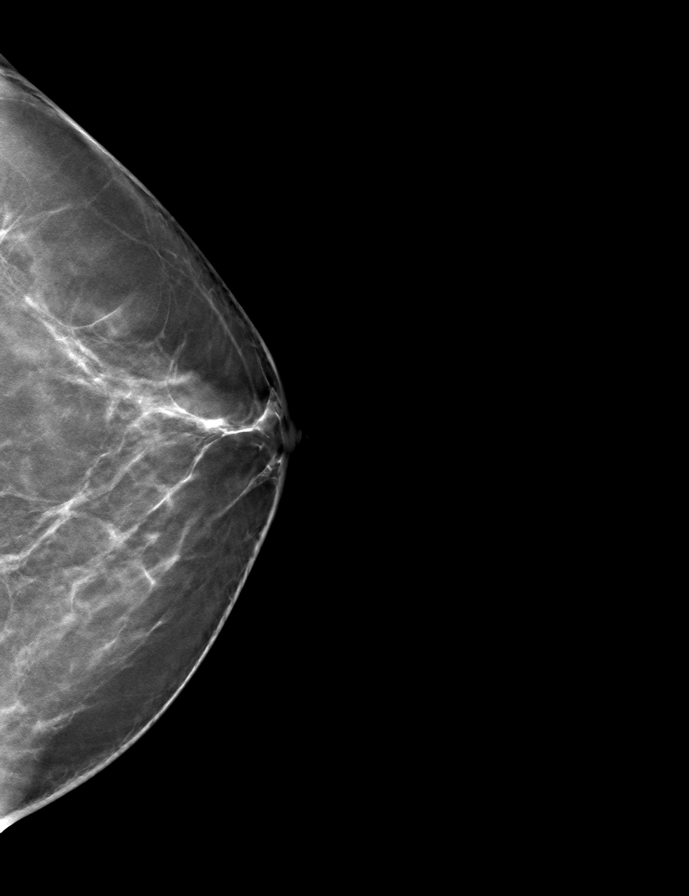

[L MLO tomo · tomo slice 39/78.0]
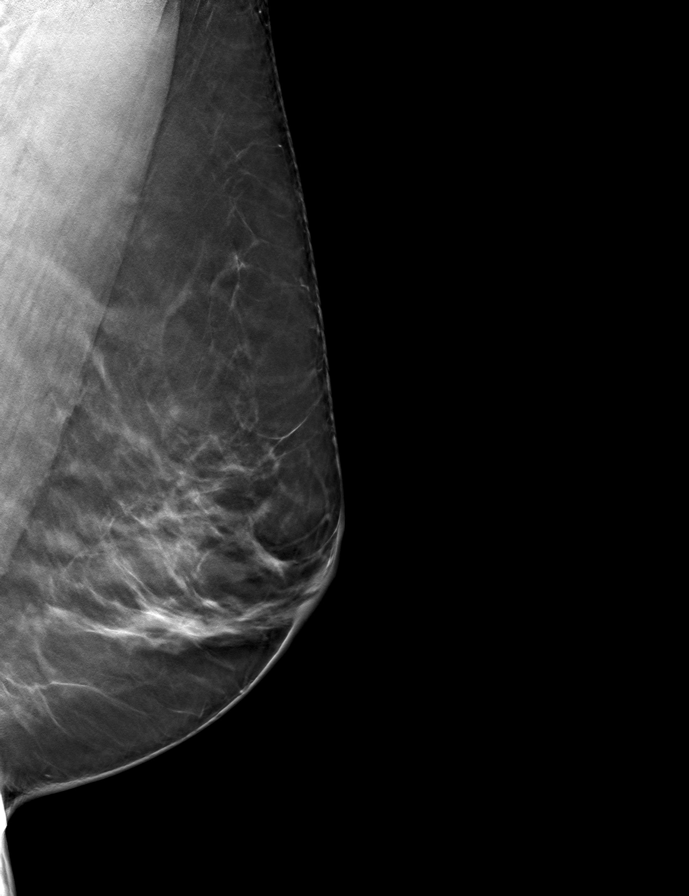

[R MLO tomo · tomo slice 40/79.0]
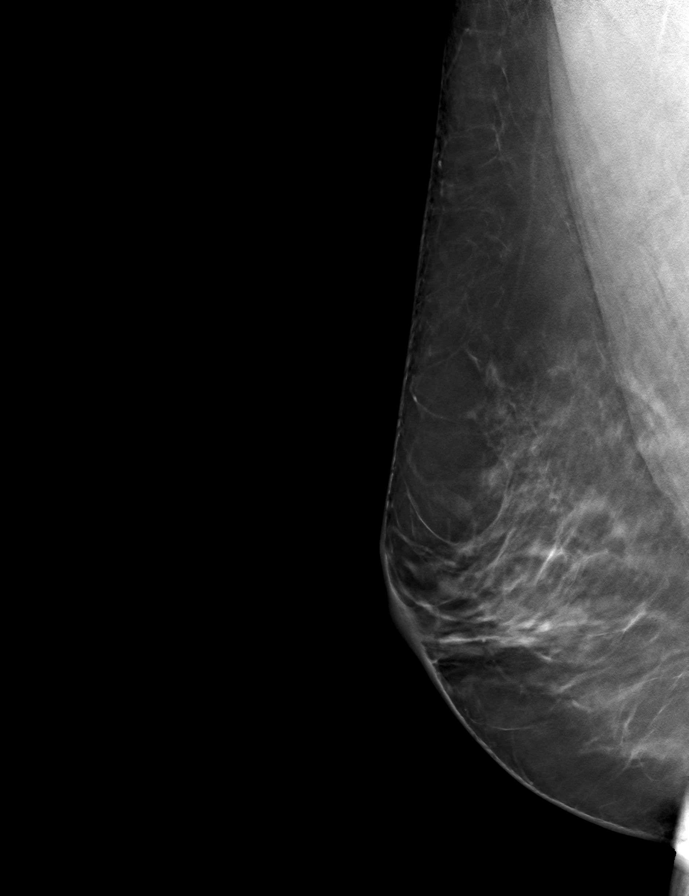

[9 of 24 positions shown; findings below may reference images not displayed]

ACR Breast Density Category b: There are scattered areas of
fibroglandular density.
FINDINGS: There are no findings suspicious for malignancy. The images were
evaluated with computer-aided detection.
IMPRESSION: No mammographic evidence of malignancy. A result letter of this
screening mammogram will be mailed directly to the patient.

RECOMMENDATION:
Screening mammogram in one year. (Code:[OD])

BI-RADS CATEGORY  1: Negative.

## 2020-04-13 MED ORDER — PREDNISONE 50 MG PO TABS: 50.0000 mg | ORAL_TABLET | Freq: Every day | ORAL | 0 refills | Status: DC

## 2020-04-13 MED ORDER — GABAPENTIN 300 MG PO CAPS: 300.0000 mg | ORAL_CAPSULE | Freq: Every evening | ORAL | 3 refills | Status: DC | PRN

## 2020-04-13 NOTE — Progress Notes (Signed)
I, [name], LAT, ATC acting as a scribe for Lynne Leader, MD.  Subjective:    I'm seeing this patient as a consultation for Dr. Cathlean Cower. Note will be routed back to referring provider/PCP.  CC: Low back pain  HPI: Pt is a 50 y/o female c/o acute-on-chronic low back pain ongoing for about 2 months w/ R leg numbness down to R foot Pt had an L-spine MRI ordered by PCP on 03/23/20. Pt locates pain to bilat low back pain.  She additionally has some pain radiating down the left leg to a lesser degree.  She notes the numbness and pain are bothersome and interfere with sleep.  She also notes significant pain in her thoracic paraspinal musculature area bilaterally.  This is quite bothersome to her.  This is bothersome when she is up and active.  She uses heat which has helped to an extent.  Additionally she takes ibuprofen Aleve and Tylenol which helps some to.  Radiates: yes LE Numbness/tingling: yes- R leg LE Weakness: yes- R leg Aggravates: sitting for extended periods, trunk rotation Treatments tried: flexeril  Dx imaging: ordered- L spine MRI - insurance denied  03/23/20 T-spine XR  06/28/19 L-spine XR  Past medical history, Surgical history, Family history, Social history, Allergies, and medications have been entered into the medical record, reviewed.   Review of Systems: No new headache, visual changes, nausea, vomiting, diarrhea, constipation, dizziness, abdominal pain, skin rash, fevers, chills, night sweats, weight loss, swollen lymph nodes, body aches, joint swelling, muscle aches, chest pain, shortness of breath, mood changes, visual or auditory hallucinations.   Objective:    Vitals:   04/13/20 1404  BP: 97/61  Pulse: 67  SpO2: 99%   General: Well Developed, well nourished, and in no acute distress.  Neuro/Psych: Alert and oriented x3, extra-ocular muscles intact, able to move all 4 extremities, sensation grossly intact. Skin: Warm and dry, no rashes noted.   Respiratory: Not using accessory muscles, speaking in full sentences, trachea midline.  Cardiovascular: Pulses palpable, no extremity edema. Abdomen: Does not appear distended. MSK: T-spine normal-appearing Nontender midline. Mildly tender palpation paraspinal musculature.  L-spine normal-appearing nontender midline.  Minimally tender paraspinal musculature. Decreased lumbar motion pain with extension. Lower extremity strength is intact. Negative Spurling's test.  Lab and Radiology Results  EXAM: THORACIC SPINE 2 VIEWS  COMPARISON:  06/28/2019  FINDINGS: Mild scoliosis. Sagittal alignment within normal limits. Vertebral body heights are maintained. Minimal degenerative osteophytes.  IMPRESSION: Mild scoliosis and degenerative change.   Electronically Signed   By: Donavan Foil M.D.   On: 03/23/2020 23:53  EXAM: LUMBAR SPINE - COMPLETE 4+ VIEW  COMPARISON:  Lumbar radiograph 12/31/2018  FINDINGS: The alignment is maintained. Vertebral body heights are normal. There is no listhesis. The posterior elements are intact. Disc spaces are preserved. No fracture. No evidence of focal bone lesion. Sacroiliac joints are symmetric and normal.  IMPRESSION: Negative radiographs of the lumbar spine.   Electronically Signed   By: Keith Rake M.D.   On: 06/29/2019 21:46  I, Lynne Leader, personally (independently) visualized and performed the interpretation of the images attached in this note.   Impression and Recommendations:    Assessment and Plan: 50 y.o. female with lumbar radiculopathy bilaterally.  This has been ongoing for months.  She has had some conservative management already.  We will maximize conservative management with physical therapy trial.  Additionally we will proceed with short course of prednisone and gabapentin and recheck back in about a  month.  Additionally she has thoracic back pain around the thoracic paraspinal musculature and  thought to be rhomboids as well.  Physical therapy should be beneficial.  Plan to refer to PT and again recheck back in about a month.  PDMP not reviewed this encounter. Orders Placed This Encounter  Procedures  . Ambulatory referral to Physical Therapy    Referral Priority:   Routine    Referral Type:   Physical Medicine    Referral Reason:   Specialty Services Required    Requested Specialty:   Physical Therapy   Meds ordered this encounter  Medications  . predniSONE (DELTASONE) 50 MG tablet    Sig: Take 1 tablet (50 mg total) by mouth daily.    Dispense:  5 tablet    Refill:  0  . gabapentin (NEURONTIN) 300 MG capsule    Sig: Take 1 capsule (300 mg total) by mouth at bedtime as needed (nerve pain).    Dispense:  90 capsule    Refill:  3    Discussed warning signs or symptoms. Please see discharge instructions. Patient expresses understanding.   The above documentation has been reviewed and is accurate and complete Lynne Leader, M.D.

## 2020-04-13 NOTE — Patient Instructions (Signed)
Thank you for coming in today.  I've referred you to Physical Therapy.  Let us know if you don't hear from them in one week.  Take the gabapentin at night.   Take the prednisone for 5 days.   Recheck with me in 1 month.   If not better we can do MRI.    Radicular Pain Radicular pain is a type of pain that spreads from your back or neck along a spinal nerve. Spinal nerves are nerves that leave the spinal cord and go to the muscles. Radicular pain is sometimes called radiculopathy, radiculitis, or a pinched nerve. When you have this type of pain, you may also have weakness, numbness, or tingling in the area of your body that is supplied by the nerve. The pain may feel sharp and burning. Depending on which spinal nerve is affected, the pain may occur in the:  Neck area (cervical radicular pain). You may also feel pain, numbness, weakness, or tingling in the arms.  Mid-spine area (thoracic radicular pain). You would feel this pain in the back and chest. This type is rare.  Lower back area (lumbar radicular pain). You would feel this pain as low back pain. You may feel pain, numbness, weakness, or tingling in the buttocks or legs. Sciatica is a type of lumbar radicular pain that shoots down the back of the leg. Radicular pain occurs when one of the spinal nerves becomes irritated or squeezed (compressed). It is often caused by something pushing on a spinal nerve, such as one of the bones of the spine (vertebrae) or one of the round cushions between vertebrae (intervertebral disks). This can result from:  An injury.  Wear and tear or aging of a disk.  The growth of a bone spur that pushes on the nerve. Radicular pain often goes away when you follow instructions from your health care provider for relieving pain at home. Follow these instructions at home: Managing pain  If directed, put ice on the affected area: ? Put ice in a plastic bag. ? Place a towel between your skin and the  bag. ? Leave the ice on for 20 minutes, 2-3 times a day.  If directed, apply heat to the affected area as often as told by your health care provider. Use the heat source that your health care provider recommends, such as a moist heat pack or a heating pad. ? Place a towel between your skin and the heat source. ? Leave the heat on for 20-30 minutes. ? Remove the heat if your skin turns bright red. This is especially important if you are unable to feel pain, heat, or cold. You may have a greater risk of getting burned.      Activity  Do not sit or rest in bed for long periods of time.  Try to stay as active as possible. Ask your health care provider what type of exercise or activity is best for you.  Avoid activities that make your pain worse, such as bending and lifting.  Do not lift anything that is heavier than 10 lb (4.5 kg), or the limit that you are told, until your health care provider says that it is safe.  Practice using proper technique when lifting items. Proper lifting technique involves bending your knees and rising up.  Do strength and range-of-motion exercises only as told by your health care provider or physical therapist.   General instructions  Take over-the-counter and prescription medicines only as told by your health care  provider.  Pay attention to any changes in your symptoms.  Keep all follow-up visits as told by your health care provider. This is important. ? Your health care provider may send you to a physical therapist to help with this pain. Contact a health care provider if:  Your pain and other symptoms get worse.  Your pain medicine is not helping.  Your pain has not improved after a few weeks of home care.  You have a fever. Get help right away if:  You have severe pain, weakness, or numbness.  You have difficulty with bladder or bowel control. Summary  Radicular pain is a type of pain that spreads from your back or neck along a spinal  nerve.  When you have radicular pain, you may also have weakness, numbness, or tingling in the area of your body that is supplied by the nerve.  The pain may feel sharp or burning.  Radicular pain may be treated with ice, heat, medicines, or physical therapy. This information is not intended to replace advice given to you by your health care provider. Make sure you discuss any questions you have with your health care provider. Document Revised: 07/11/2017 Document Reviewed: 07/11/2017 Elsevier Patient Education  2021 [employer].

## 2020-05-04 ENCOUNTER — Other Ambulatory Visit: Payer: Self-pay

## 2020-05-04 ENCOUNTER — Encounter: Payer: Self-pay | Admitting: Physical Therapy

## 2020-05-04 ENCOUNTER — Ambulatory Visit: Payer: Medicare HMO | Attending: Family Medicine | Admitting: Physical Therapy

## 2020-05-04 DIAGNOSIS — M545 Low back pain, unspecified: Secondary | ICD-10-CM

## 2020-05-04 DIAGNOSIS — M542 Cervicalgia: Secondary | ICD-10-CM | POA: Insufficient documentation

## 2020-05-04 DIAGNOSIS — R293 Abnormal posture: Secondary | ICD-10-CM | POA: Insufficient documentation

## 2020-05-04 DIAGNOSIS — M62830 Muscle spasm of back: Secondary | ICD-10-CM | POA: Insufficient documentation

## 2020-05-04 DIAGNOSIS — M546 Pain in thoracic spine: Secondary | ICD-10-CM | POA: Diagnosis not present

## 2020-05-04 NOTE — Patient Instructions (Signed)

## 2020-05-04 NOTE — Therapy (Signed)
[place]. [place], [place], [address] Phone: [phone]   Fax:  [phone]  Physical Therapy Evaluation  Patient Details  Name: Hannah Barron MRN: [id number] Date of Birth: 1970-05-23 Referring Provider (PT): Marikay Alar Date: 05/04/2020   PT End of Session - 05/04/20 1633    Visit Number 1    Date for PT Re-Evaluation 08/03/20    Authorization Type Aetna    PT Start Time 1604    PT Stop Time 1650    PT Time Calculation (min) 46 min    Activity Tolerance Patient limited by pain    Behavior During Therapy [facility] for tasks assessed/performed           Past Medical History:  Diagnosis Date  . Anemia   . Bronchiectasis ([place])   . COPD (chronic obstructive pulmonary disease) ([place])   . GERD (gastroesophageal reflux disease)   . Migraines   . Paroxysmal SVT (supraventricular tachycardia) ([place])    a. diagnosed in 11/2015.  . Right leg numbness     Past Surgical History:  Procedure Laterality Date  . CESAREAN SECTION    . CYSTOSCOPY N/A 04/05/2016   Procedure: CYSTOSCOPY;  Surgeon: Lavonia Drafts, MD;  Location: [facility];  Service: Gynecology;  Laterality: N/A;  . LAPAROSCOPIC VAGINAL HYSTERECTOMY WITH SALPINGECTOMY Bilateral 04/05/2016   Procedure: LAPAROSCOPIC ASSISTED VAGINAL HYSTERECTOMY WITH SALPINGECTOMY;  Surgeon: Lavonia Drafts, MD;  Location: [facility];  Service: Gynecology;  Laterality: Bilateral;  . LUNG BIOPSY    . VIDEO BRONCHOSCOPY Bilateral 03/29/2016   Procedure: VIDEO BRONCHOSCOPY WITHOUT FLUORO;  Surgeon: Marshell Garfinkel, MD;  Location: [facility];  Service: Cardiopulmonary;  Laterality: Bilateral;    There were no vitals filed for this visit.    Subjective Assessment - 05/04/20 1611    Subjective Reports that she has been having neck and back pain for about 3 months, she reports that her back, ribs and neck become very tight especially in a cold place.  MRI and X-ray showed  mild degenerative  changes    Limitations Sitting;Lifting;Standing;Walking;House hold activities    Patient Stated Goals have less pain and tightness    Currently in Pain? Yes    Pain Score 5     Pain Location Back    Pain Orientation Upper;Mid;Lower    Pain Descriptors / Indicators Aching;Spasm;Tightness    Pain Type Acute pain    Pain Radiating Towards reports numbness in the right thigh at night    Pain Onset More than a month ago    Pain Frequency Intermittent    Aggravating Factors  cold place, "I become very tense and tight and a lot of pain up to 8/10    Pain Relieving Factors warm place no problem    Effect of Pain on Daily Activities reports difficulty due to pain              [facility] PT Assessment - 05/04/20 0001      Assessment   Medical Diagnosis neck and back pain    Referring Provider (PT) Georgina Snell    Onset Date/Surgical Date 04/03/20    Prior Therapy 18 months ago with good relief for neck pain      Precautions   Precautions None      Balance Screen   Has the patient fallen in the past 6 months No    Has the patient had a decrease in activity level because of a fear of falling?  No  Is the patient reluctant to leave their home because of a fear of falling?  No      Home Environment   Additional Comments has stairs, does housework      Prior Function   Level of Independence Independent    Vocation Full time employment    Vocation Requirements mostly standing    Leisure no exercise      Posture/Postural Control   Posture Comments fwd head, rounded shoulders, slouched posture      ROM / Strength   AROM / PROM / Strength AROM;Strength      AROM   Overall AROM  --    Overall AROM Comments cervical ROM decreased 50% with pain in the neck, Lumbar ROM is decreased 75% with pain in the "ribs and back"      Strength   Overall Strength Comments UE and LE strength 4+/5 with some pain in the neck and the low back      Palpation   Palpation comment she is  very tight in the cervical area, the thoracic and lumbar area, easy compression of the ribs was painful                      Objective measurements completed on examination: See above findings.       [facility] Adult PT Treatment/Exercise - 05/04/20 0001      Modalities   Modalities Electrical Stimulation;Moist Heat      Moist Heat Therapy   Number Minutes Moist Heat 12 Minutes    Moist Heat Location Cervical;Lumbar Spine      Electrical Stimulation   Electrical Stimulation Location T/L area    Electrical Stimulation Action IFC    Electrical Stimulation Parameters supine    Electrical Stimulation Goals Pain                    PT Short Term Goals - 05/04/20 1642      PT SHORT TERM GOAL #1   Title Pt will be independent with her initial HEP    Time 2    Period Weeks    Status New             PT Long Term Goals - 05/04/20 1642      PT LONG TERM GOAL #1   Title Pt will report pain 4/10 or less in all situations temperature wise in order to improve quality of life.    Time 12    Period Weeks    Status New      PT LONG TERM GOAL #2   Title increase lumbar ROM 25%    Time 12    Period Weeks    Status New      PT LONG TERM GOAL #3   Title understand posture and body mechanics    Time 12    Period Weeks    Status New      PT LONG TERM GOAL #4   Title indepednent with advanced HEP    Time 12    Period Weeks    Status New                  Plan - 05/04/20 1633    Clinical Impression Statement Patient was seen here for neck pain about 2 years ago, with good results, she reports that over the past 3 months she has been having significant spasms and pain i the neck, ribs and lumbar area, reports that this is mostly  due to cold air or cold places, reports that the spasms make her miserable.  Reports that warm places she will not have issues, her mms from the upper traps, through the thoracic and lumbar mms are very tight and tender, gentle  compression of the ribs also caused pain.  x-rays were negative except for som emild degenerative changes, her ROM is very limited in teh neck and low back    Stability/Clinical Decision Making Evolving/Moderate complexity    Clinical Decision Making Moderate    PT Frequency 2x / week    PT Duration 12 weeks    PT Treatment/Interventions ADLs/Self Care Home Management;Electrical Stimulation;Moist Heat;Traction;Ultrasound;Functional mobility training;Therapeutic activities;Therapeutic exercise;Balance training;Neuromuscular re-education;Manual techniques;Dry needling;Patient/family education    PT Next Visit Plan work on strength and trunk ROM, rib mobility, could try DN    Consulted and Agree with Plan of Care Patient           Patient will benefit from skilled therapeutic intervention in order to improve the following deficits and impairments:  Decreased range of motion,Difficulty walking,Impaired tone,Increased muscle spasms,Pain,Improper body mechanics,Impaired flexibility,Decreased strength,Postural dysfunction  Visit Diagnosis: Acute bilateral low back pain without sciatica - Plan: PT plan of care cert/re-cert  Abnormal posture - Plan: PT plan of care cert/re-cert  Muscle spasm of back - Plan: PT plan of care cert/re-cert  Cervicalgia - Plan: PT plan of care cert/re-cert  Pain in thoracic spine - Plan: PT plan of care cert/re-cert     Problem List Patient Active Problem List   Diagnosis Date Noted  . Encounter for well adult exam with abnormal findings 03/23/2020  . 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 Non-traumatic mid back pain 03/23/2020  . IBS (irritable bowel syndrome) 03/23/2020  . Costochondritis 10/13/2019  . Anxiety 10/13/2019  . Urinary frequency 06/29/2019  . Vitamin D deficiency 10/01/2018  . HLD (hyperlipidemia) 10/01/2018  . Right cervical radiculopathy 10/01/2018  . Trapezius muscle strain, right, initial encounter 10/01/2018  . Right carpal tunnel syndrome  10/01/2018  . Cerumen impaction 09/07/2017  . Acne vulgaris 12/28/2016  . Bronchiectasis with acute exacerbation ([place]) 12/22/2016  . Medicare annual wellness visit, subsequent 04/28/2016  . Preventative health care 04/28/2016  . Lung nodules 03/16/2016  . Palpitations   . GERD (gastroesophageal reflux disease) 07/30/2015  . Bronchiectasis without acute exacerbation ([place]) 07/08/2015  . COPD (chronic obstructive pulmonary disease) ([place]) 06/04/2015  . Fatigue 06/04/2015    Sumner Boast., PT 05/04/2020, 4:46 PM  [place]. [place], [place], [address] Phone: [phone]   Fax:  [phone]  Name: Addisen Chappelle MRN: [id number] Date of Birth: [birth year]

## 2020-05-08 ENCOUNTER — Other Ambulatory Visit: Payer: Self-pay

## 2020-05-08 ENCOUNTER — Ambulatory Visit: Payer: Medicare HMO | Admitting: Family Medicine

## 2020-05-08 VITALS — BP 110/78 | HR 77 | Ht 67.0 in | Wt 166.8 lb

## 2020-05-08 DIAGNOSIS — M5441 Lumbago with sciatica, right side: Secondary | ICD-10-CM | POA: Diagnosis not present

## 2020-05-08 DIAGNOSIS — G8929 Other chronic pain: Secondary | ICD-10-CM

## 2020-05-08 DIAGNOSIS — M5442 Lumbago with sciatica, left side: Secondary | ICD-10-CM | POA: Diagnosis not present

## 2020-05-08 DIAGNOSIS — M546 Pain in thoracic spine: Secondary | ICD-10-CM | POA: Diagnosis not present

## 2020-05-08 MED ORDER — GABAPENTIN 100 MG PO CAPS: 100.0000 mg | ORAL_CAPSULE | Freq: Three times a day (TID) | ORAL | 3 refills | Status: DC | PRN

## 2020-05-08 NOTE — Patient Instructions (Signed)
Thank you for coming in today.   Try thermacare wraps.   Try heat.   Try the lower dose of gabapentin.   Recheck with me as needed.   Consider more PT in the future.

## 2020-05-08 NOTE — Progress Notes (Signed)
   I, [name], LAT, ATC acting as a scribe for Lynne Leader, MD.  Hannah Barron is a 50 y.o. female who presents to [facility] today for f/u bilat low back and thoracic back pain. Pt was last seen by Dr. Georgina Snell on 04/13/20 and was prescribed a short course of prednisone and gabapentin. Pt was also referred to PT of which she's completed 1 visit. Today, pt reports a lot of relief from the prednisone. Pt has additional PT sessions scheduled. Pt starts a new job on Monday at the [employer]. Pt reports 60% improvement in pain. Pt notes the gabapentin is "too strong" and she is very groggy and head is spinning the following day.   Dx imaging: ordered- L spine MRI - insurance denied             03/23/20 T-spine XR             06/28/19 L-spine XR  Pertinent review of systems: No fevers or chills  Relevant historical information: COPD   Exam:  BP 110/78 (BP Location: Left Arm, Patient Position: Sitting, Cuff Size: Normal)   Pulse 77   Ht 5\' 7"  (1.702 m)   Wt 166 lb 12.8 oz (75.7 kg)   LMP 03/31/2016   SpO2 98%   BMI 26.12 kg/m  General: Well Developed, well nourished, and in no acute distress.   MSK: Spine: Normal motion   Assessment and Plan: 50 y.o. female with low back and thoracic back pain improving with course of oral prednisone.  Additionally patient has had 1 physical therapy session.  Encouraged continued PT and home exercise program.  Patient found that gabapentin although was helpful was intolerable at 300 mg.  Plan to reduce dose of gabapentin down to 100 mg and see if she will tolerate that a little bit better.  Recheck back in 6 weeks.   PDMP not reviewed this encounter. No orders of the defined types were placed in this encounter.  Meds ordered this encounter  Medications  . gabapentin (NEURONTIN) 100 MG capsule    Sig: Take 1 capsule (100 mg total) by mouth 3 (three) times daily as needed.    Dispense:  90 capsule    Refill:  3      Discussed warning signs or symptoms. Please see discharge instructions. Patient expresses understanding.   The above documentation has been reviewed and is accurate and complete Lynne Leader, M.D.  Total encounter time 20 minutes including face-to-face time with the patient and, reviewing past medical record, and charting on the date of service.   Treatment plan and options

## 2020-05-11 ENCOUNTER — Ambulatory Visit: Payer: Medicare HMO | Admitting: Physical Therapy

## 2020-05-11 ENCOUNTER — Ambulatory Visit: Payer: Medicare HMO | Admitting: Family Medicine

## 2020-05-14 ENCOUNTER — Other Ambulatory Visit: Payer: Self-pay

## 2020-05-14 ENCOUNTER — Ambulatory Visit (INDEPENDENT_AMBULATORY_CARE_PROVIDER_SITE_OTHER)
Admission: RE | Admit: 2020-05-14 | Discharge: 2020-05-14 | Disposition: A | Discharge status: Home / Self Care | Payer: Medicare HMO | Source: Ambulatory Visit | Attending: Pulmonary Disease | Admitting: Pulmonary Disease

## 2020-05-14 ENCOUNTER — Inpatient Hospital Stay: Admission: RE | Admit: 2020-05-14 | Payer: Medicare HMO | Source: Ambulatory Visit

## 2020-05-14 DIAGNOSIS — R0602 Shortness of breath: Secondary | ICD-10-CM | POA: Diagnosis not present

## 2020-05-14 DIAGNOSIS — J471 Bronchiectasis with (acute) exacerbation: Secondary | ICD-10-CM

## 2020-05-14 DIAGNOSIS — R918 Other nonspecific abnormal finding of lung field: Secondary | ICD-10-CM | POA: Diagnosis not present

## 2020-05-14 DIAGNOSIS — J479 Bronchiectasis, uncomplicated: Secondary | ICD-10-CM | POA: Diagnosis not present

## 2020-05-14 DIAGNOSIS — J929 Pleural plaque without asbestos: Secondary | ICD-10-CM | POA: Diagnosis not present

## 2020-05-14 DIAGNOSIS — J984 Other disorders of lung: Secondary | ICD-10-CM | POA: Diagnosis not present

## 2020-05-14 IMAGING — CT CT CHEST W/O CM
2 of 4 series · 15 of 36 positions shown, 18 images · non-contrast
Comparison: CT chest [DATE]

CLINICAL DATA: Bronchiectasis.

EXAM:
CT CHEST WITHOUT CONTRAST
TECHNIQUE: Multidetector CT imaging of the chest was performed following the
standard protocol without IV contrast.

[Series 2: thorax · axial · 0.67mm/px · z∈[-193,+71]mm · 12 of 157 slices shown, 15 images]
[im 13/157  mediastinal]
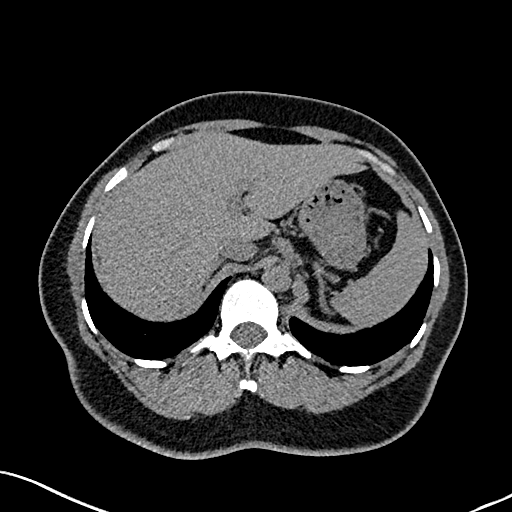
[im 13/157  lung]
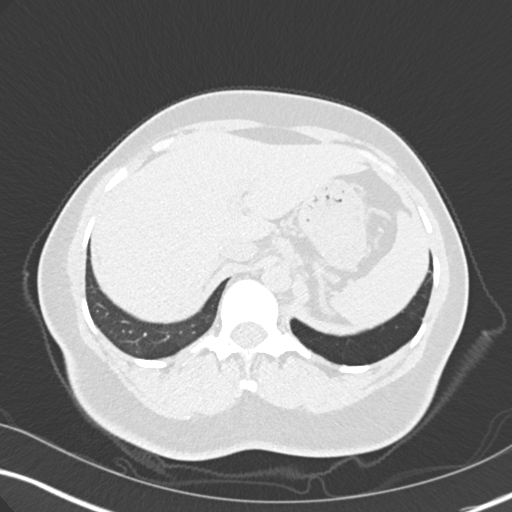
[im 25/157  lung]
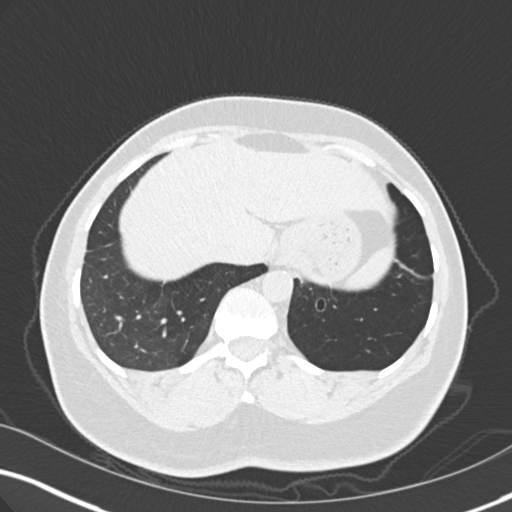
[im 37/157  lung]
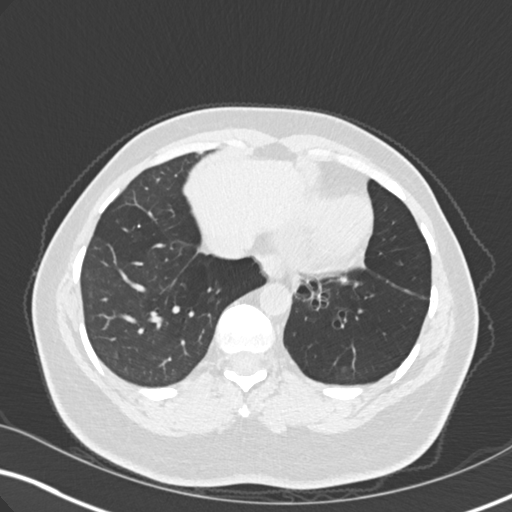
[im 49/157  lung]
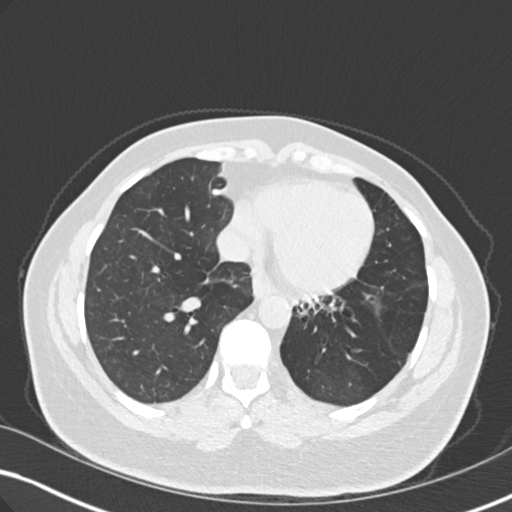
[im 61/157  mediastinal]
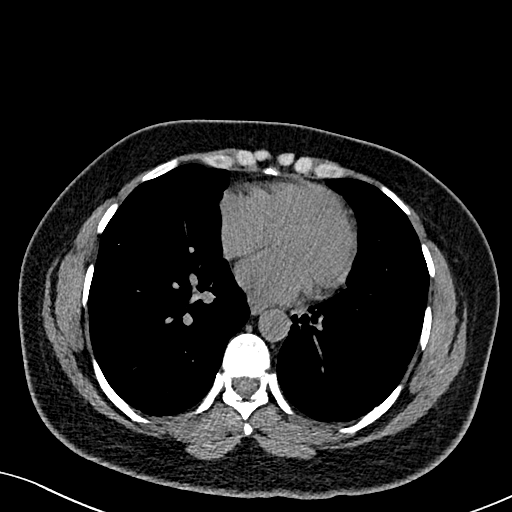
[im 61/157  lung]
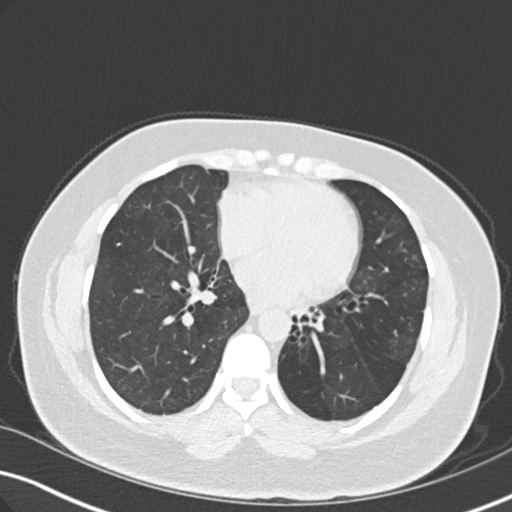
[im 73/157  lung]
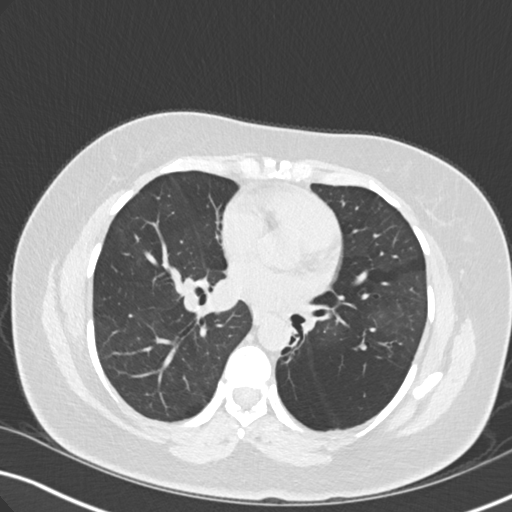
[im 85/157  lung]
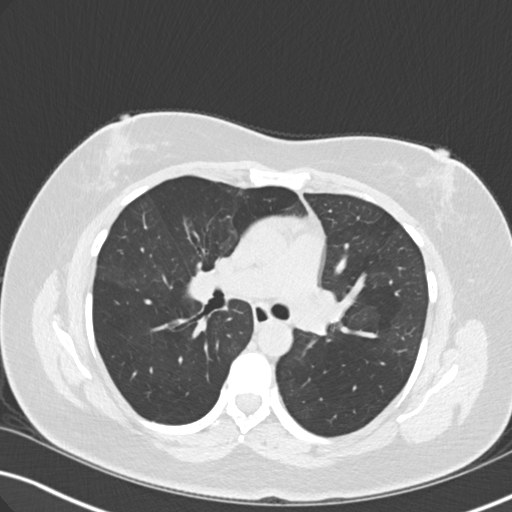
[im 97/157  lung]
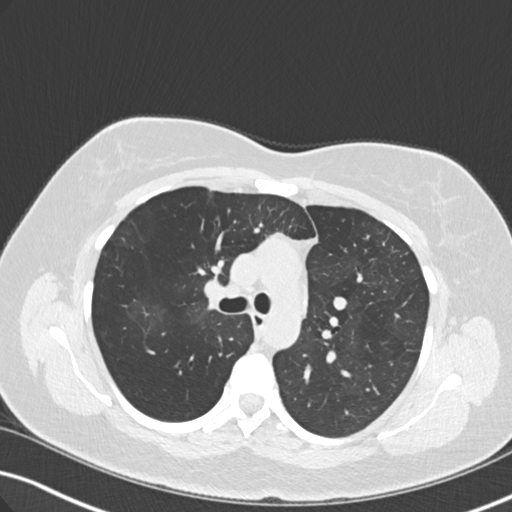
[im 109/157  mediastinal]
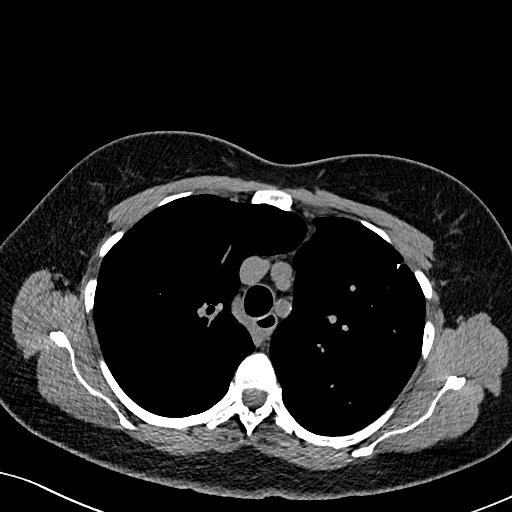
[im 109/157  lung]
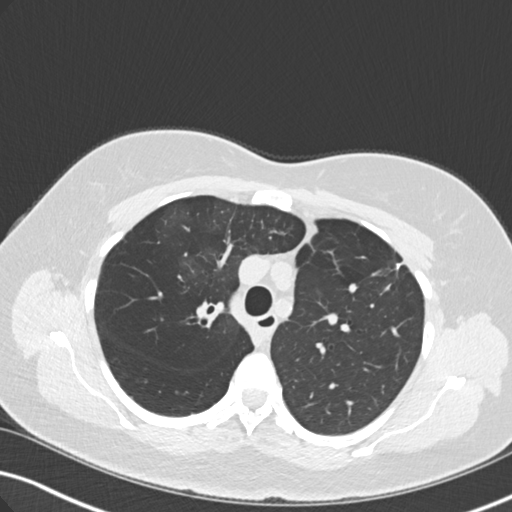
[im 121/157  lung]
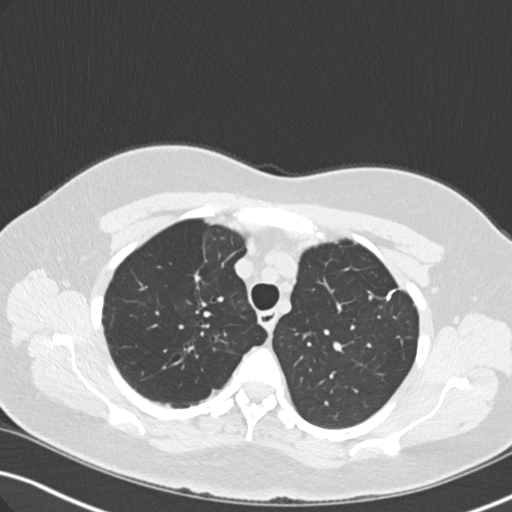
[im 133/157  lung]
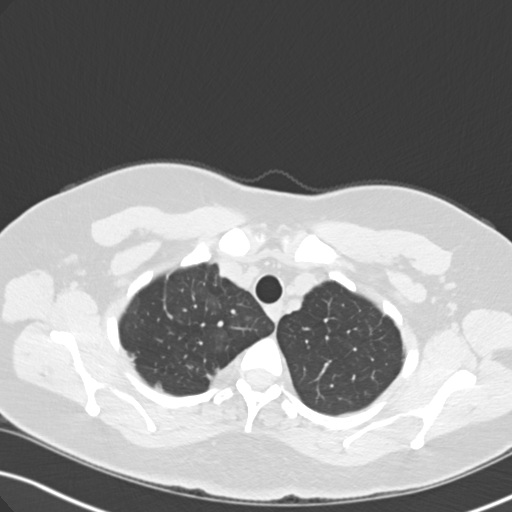
[im 145/157  lung]
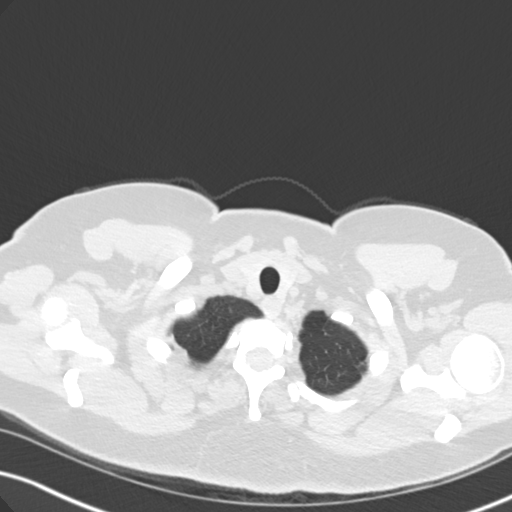

[Series 5: coronal · coronal · 0.61mm/px · 3 of 130 slices shown]
[im 26/130  lung]
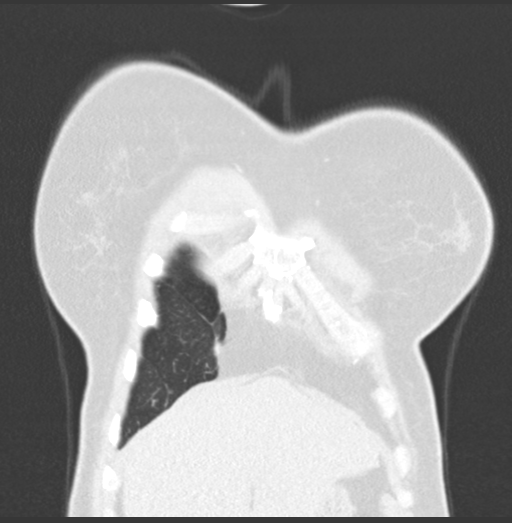
[im 52/130  lung]
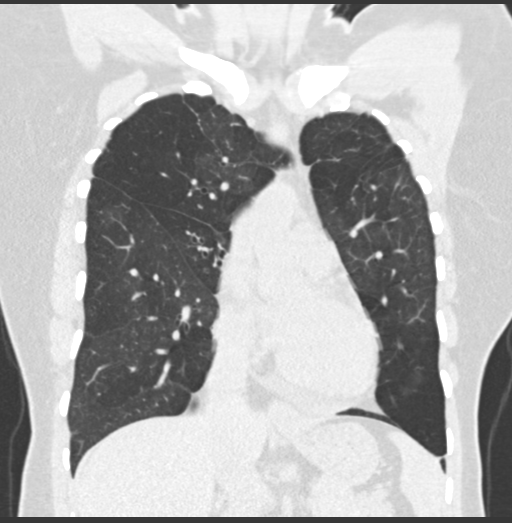
[im 78/130  lung]
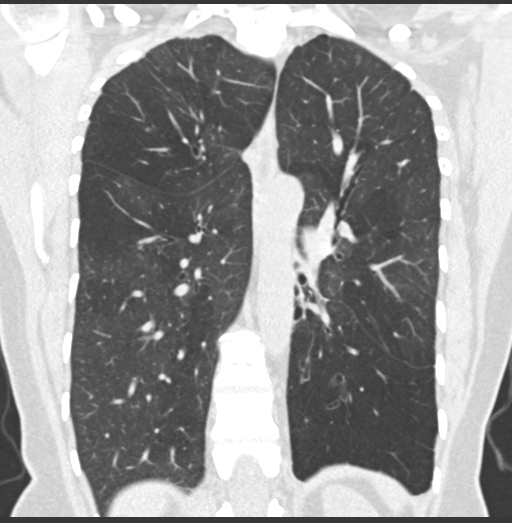

[15 of 36 positions shown; findings below may reference images not displayed]

FINDINGS: Cardiovascular: No significant vascular findings. Normal heart size.
No pericardial effusion.

Mediastinum/Nodes: No axillary or supraclavicular adenopathy. No
mediastinal or hilar adenopathy. No pericardial fluid. Esophagus
normal.

Lungs/Pleura: Cylindrical bronchiectasis in the medial aspect of the
LEFT lower lobe is not changed significantly from CT [DATE].

Mild apical thickening at the RIGHT lung apex is unchanged. Mild
bronchiectasis with mucoid impaction in the RIGHT middle lobe (image
66/3) is slightly increased from comparison exam.

Subpleural nodule in the RIGHT lower lobe measuring 6 mm (image
92/series 3) compares to 6 mm on prior.

Linear scarring in the LEFT upper lobe associated with the resection
margin is unchanged.

Discrete nodule in the LEFT lower lobe (image 59/3) measures 5 mm
unchanged.

Upper Abdomen: Limited view of the liver, kidneys, pancreas are
unremarkable. Normal adrenal glands.

Musculoskeletal: No aggressive osseous lesion.
IMPRESSION: 1. Stable cylindrical bronchiectasis in LEFT lower lobe compared to
2. Mild progression of minimal bronchiectasis in the RIGHT middle
lobe.
3. Additional pulmonary nodules unchanged from comparison exam.

## 2020-05-15 ENCOUNTER — Other Ambulatory Visit: Payer: Medicare HMO

## 2020-05-18 ENCOUNTER — Ambulatory Visit: Payer: Medicare HMO | Admitting: Physical Therapy

## 2020-05-18 ENCOUNTER — Other Ambulatory Visit: Payer: Medicare HMO

## 2020-05-25 ENCOUNTER — Ambulatory Visit: Payer: Medicare HMO | Admitting: Physical Therapy

## 2020-05-27 ENCOUNTER — Telehealth: Payer: Self-pay | Admitting: Pulmonary Disease

## 2020-05-27 MED ORDER — ALBUTEROL SULFATE HFA 108 (90 BASE) MCG/ACT IN AERS: INHALATION_SPRAY | RESPIRATORY_TRACT | 2 refills | Status: DC

## 2020-05-27 NOTE — Progress Notes (Signed)
I spoke with the pt and notified of results. She verbalized understanding.

## 2020-05-27 NOTE — Telephone Encounter (Signed)
ATC patient LMTCB   Marshell Garfinkel, MD  05/25/2020  4:52 PM EDT      CT looks mostly stable with mild increase of bronchiectasis in the right side.  Continue current therapy Ensure that she is using the percussion vest for mucus clearance   Result encounter will need to be closed under Lattie Haw once we talk to patient as well

## 2020-05-27 NOTE — Telephone Encounter (Signed)
Spoke with pt and notified of results per Dr. Mannam.  Pt verbalized understanding and denied any questions. 

## 2020-06-01 ENCOUNTER — Ambulatory Visit: Payer: Medicare HMO | Admitting: Physical Therapy

## 2020-06-17 ENCOUNTER — Other Ambulatory Visit: Payer: Self-pay

## 2020-06-17 ENCOUNTER — Encounter: Payer: Self-pay | Admitting: Physical Therapy

## 2020-06-17 ENCOUNTER — Ambulatory Visit: Payer: Medicare HMO | Attending: Family Medicine | Admitting: Physical Therapy

## 2020-06-17 DIAGNOSIS — M546 Pain in thoracic spine: Secondary | ICD-10-CM | POA: Diagnosis not present

## 2020-06-17 DIAGNOSIS — R293 Abnormal posture: Secondary | ICD-10-CM | POA: Diagnosis not present

## 2020-06-17 DIAGNOSIS — M62830 Muscle spasm of back: Secondary | ICD-10-CM | POA: Insufficient documentation

## 2020-06-17 DIAGNOSIS — M542 Cervicalgia: Secondary | ICD-10-CM | POA: Insufficient documentation

## 2020-06-17 DIAGNOSIS — M545 Low back pain, unspecified: Secondary | ICD-10-CM | POA: Diagnosis not present

## 2020-06-17 DIAGNOSIS — M5412 Radiculopathy, cervical region: Secondary | ICD-10-CM | POA: Diagnosis not present

## 2020-06-17 NOTE — Therapy (Signed)
[facility] [address]. [place], [place], [address] Phone: [phone]   Fax:  [phone]  Physical Therapy Treatment  Patient Details  Name: Hannah Barron MRN: [id number] Date of Birth: 08/20/1970 Referring Provider (PT): Cheron Every Date: 06/17/2020   PT End of Session - 06/17/20 1733    Visit Number 2    Date for PT Re-Evaluation 08/03/20    Authorization Type Aetna    PT Start Time 1655    PT Stop Time 1744    PT Time Calculation (min) 49 min    Activity Tolerance Patient limited by pain    Behavior During Therapy [facility] [place] for tasks assessed/performed           Past Medical History:  Diagnosis Date  . Anemia   . Bronchiectasis ([facility])   . COPD (chronic obstructive pulmonary disease) ([facility])   . GERD (gastroesophageal reflux disease)   . Migraines   . Paroxysmal SVT (supraventricular tachycardia) ([facility])    a. diagnosed in 11/2015.  . Right leg numbness     Past Surgical History:  Procedure Laterality Date  . CESAREAN SECTION    . CYSTOSCOPY N/A 04/05/2016   Procedure: CYSTOSCOPY;  Surgeon: Willodean Rosenthal, MD;  Location: [facility];  Service: Gynecology;  Laterality: N/A;  . LAPAROSCOPIC VAGINAL HYSTERECTOMY WITH SALPINGECTOMY Bilateral 04/05/2016   Procedure: LAPAROSCOPIC ASSISTED VAGINAL HYSTERECTOMY WITH SALPINGECTOMY;  Surgeon: Willodean Rosenthal, MD;  Location: [facility];  Service: Gynecology;  Laterality: Bilateral;  . LUNG BIOPSY    . VIDEO BRONCHOSCOPY Bilateral 03/29/2016   Procedure: VIDEO BRONCHOSCOPY WITHOUT FLUORO;  Surgeon: Chilton Greathouse, MD;  Location: [facility];  Service: Cardiopulmonary;  Laterality: Bilateral;    There were no vitals filed for this visit.   Subjective Assessment - 06/17/20 1701    Subjective Patient reports that she has a new job and it is going well, she is having less overall pain but is still very tight and tender in the upper traps and the lumbar pspinals    Currently  in Pain? Yes    Pain Score 3     Pain Location Back    Pain Orientation Upper;Mid;Lower    Pain Descriptors / Indicators Spasm;Tightness    Pain Relieving Factors I think the warm weather and new job are better, the stuff you did helped                             [facility] Adult PT Treatment/Exercise - 06/17/20 0001      Modalities   Modalities Electrical Stimulation;Moist Heat      Moist Heat Therapy   Number Minutes Moist Heat 12 Minutes    Moist Heat Location Cervical;Lumbar Spine      Electrical Stimulation   Electrical Stimulation Location C/T area    Electrical Stimulation Action IFC    Electrical Stimulation Parameters supine    Electrical Stimulation Goals Pain      Manual Therapy   Manual Therapy Soft tissue mobilization    Soft tissue mobilization worked on the upper traps, the low back and the cervical area, petrissage and efflurage            Trigger Point Dry Needling - 06/17/20 0001    Consent Given? Yes    Education Handout Provided Yes    Muscles Treated Head and Neck Upper trapezius;Levator scapulae    Upper Trapezius Response Twitch reponse elicited;Palpable increased muscle length  Levator Scapulae Response Twitch response elicited;Palpable increased muscle length                  PT Short Term Goals - 06/17/20 1736      PT SHORT TERM GOAL #1   Title Pt will be independent with her initial HEP    Status Partially Met             PT Long Term Goals - 05/04/20 1642      PT LONG TERM GOAL #1   Title Pt will report pain 4/10 or less in all situations temperature wise in order to improve quality of life.    Time 12    Period Weeks    Status New      PT LONG TERM GOAL #2   Title increase lumbar ROM 25%    Time 12    Period Weeks    Status New      PT LONG TERM GOAL #3   Title understand posture and body mechanics    Time 12    Period Weeks    Status New      PT LONG TERM GOAL #4   Title indepednent with  advanced HEP    Time 12    Period Weeks    Status New                 Plan - 06/17/20 1734    Clinical Impression Statement Patient feels that her new job is going well, she reports that it is warm there and she is less tense and having some less pain. she felt like the estim helped her last time, I worked a lot on the upper traps and lumbar area with STM, did upper trap DN with a lot of LTR's,    PT Next Visit Plan try some rib mobility and or segmental mobility    Consulted and Agree with Plan of Care Patient           Patient will benefit from skilled therapeutic intervention in order to improve the following deficits and impairments:  Decreased range of motion,Difficulty walking,Impaired tone,Increased muscle spasms,Pain,Improper body mechanics,Impaired flexibility,Decreased strength,Postural dysfunction  Visit Diagnosis: Acute bilateral low back pain without sciatica  Abnormal posture  Muscle spasm of back  Cervicalgia  Pain in thoracic spine  Right cervical radiculopathy     Problem List Patient Active Problem List   Diagnosis Date Noted  . Encounter for well adult exam with abnormal findings 03/23/2020  . 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 Non-traumatic mid back pain 03/23/2020  . IBS (irritable bowel syndrome) 03/23/2020  . Costochondritis 10/13/2019  . Anxiety 10/13/2019  . Urinary frequency 06/29/2019  . Vitamin D deficiency 10/01/2018  . HLD (hyperlipidemia) 10/01/2018  . Right cervical radiculopathy 10/01/2018  . Trapezius muscle strain, right, initial encounter 10/01/2018  . Right carpal tunnel syndrome 10/01/2018  . Cerumen impaction 09/07/2017  . Acne vulgaris 12/28/2016  . Bronchiectasis with acute exacerbation ([place]) 12/22/2016  . Medicare annual wellness visit, subsequent 04/28/2016  . Preventative health care 04/28/2016  . Lung nodules 03/16/2016  . Palpitations   . GERD (gastroesophageal reflux disease) 07/30/2015  . Bronchiectasis without  acute exacerbation ([place]) 07/08/2015  . COPD (chronic obstructive pulmonary disease) ([place]) 06/04/2015  . Fatigue 06/04/2015    Sumner Boast., PT 06/17/2020, 5:36 PM  [place]. [place], [place], [address] Phone: [phone]   Fax:  [phone]  Name: Hannah Barron MRN:  [id number] Date of Birth: [birth year]

## 2020-06-17 NOTE — Patient Instructions (Signed)

## 2020-06-22 ENCOUNTER — Telehealth: Payer: Self-pay | Admitting: Pulmonary Disease

## 2020-06-22 MED ORDER — PREDNISONE 10 MG PO TABS: 40.0000 mg | ORAL_TABLET | Freq: Every day | ORAL | 0 refills | Status: AC

## 2020-06-22 NOTE — Telephone Encounter (Signed)
Spoke with pt and verified pharmacy. Reviewed Dr. Matilde Bash recommendations and placed Predisone order. Pt stated understanding. Nothing further needed at this time.

## 2020-06-22 NOTE — Telephone Encounter (Signed)
Prednisone 40 mg a day for 5 days

## 2020-06-22 NOTE — Telephone Encounter (Signed)
Called and left message on voicemail to please return phone call. Contact number provided. 

## 2020-06-22 NOTE — Telephone Encounter (Signed)
Called and spoke with patient. She stated that she developed a productive cough on Friday night. So far her phlegm has been clear but it is very thick. She denied any increased SOB, chills, body aches or fevers. Cough tends to get worse at night when she is attempting to sleep. Also denied being around anyone who has been sick recently.   She said when this normally happens, she is prescribed prednisone for a few days and the cough goes away.   Pharm[facility] [address].   Dr. Vaughan Browner, can you please advise? Thanks!

## 2020-06-25 ENCOUNTER — Encounter: Payer: Self-pay | Admitting: Gastroenterology

## 2020-07-07 ENCOUNTER — Ambulatory Visit: Payer: Medicare HMO | Admitting: Physical Therapy

## 2020-07-07 ENCOUNTER — Encounter: Payer: Self-pay | Admitting: Physical Therapy

## 2020-07-07 ENCOUNTER — Other Ambulatory Visit: Payer: Self-pay

## 2020-07-07 DIAGNOSIS — M542 Cervicalgia: Secondary | ICD-10-CM

## 2020-07-07 DIAGNOSIS — M5412 Radiculopathy, cervical region: Secondary | ICD-10-CM

## 2020-07-07 DIAGNOSIS — M62830 Muscle spasm of back: Secondary | ICD-10-CM

## 2020-07-07 DIAGNOSIS — R293 Abnormal posture: Secondary | ICD-10-CM

## 2020-07-07 DIAGNOSIS — M545 Low back pain, unspecified: Secondary | ICD-10-CM

## 2020-07-07 DIAGNOSIS — M546 Pain in thoracic spine: Secondary | ICD-10-CM | POA: Diagnosis not present

## 2020-07-07 NOTE — Therapy (Signed)
[facility] [address] [place], [place], [address] Phone: [phone]   Fax:  [phone]  Physical Therapy Treatment  Patient Details  Name: Hannah Barron MRN: [id number] Date of Birth: 05-Sep-1970 Referring Provider (PT): Denyse Amass   Encounter Date: 07/07/2020   PT End of Session - 07/07/20 1738     Visit Number 3    Date for PT Re-Evaluation 08/03/20    Authorization Type Aetna    PT Start Time 1655    PT Stop Time 1745    PT Time Calculation (min) 50 min    Activity Tolerance Patient tolerated treatment well    Behavior During Therapy [facility] for tasks assessed/performed             Past Medical History:  Diagnosis Date   Anemia    Bronchiectasis (HCC)    COPD (chronic obstructive pulmonary disease) (HCC)    GERD (gastroesophageal reflux disease)    Migraines    Paroxysmal SVT (supraventricular tachycardia) (HCC)    a. diagnosed in 11/2015.   Right leg numbness     Past Surgical History:  Procedure Laterality Date   CESAREAN SECTION     CYSTOSCOPY N/A 04/05/2016   Procedure: CYSTOSCOPY;  Surgeon: Willodean Rosenthal, MD;  Location: [facility];  Service: Gynecology;  Laterality: N/A;   LAPAROSCOPIC VAGINAL HYSTERECTOMY WITH SALPINGECTOMY Bilateral 04/05/2016   Procedure: LAPAROSCOPIC ASSISTED VAGINAL HYSTERECTOMY WITH SALPINGECTOMY;  Surgeon: Willodean Rosenthal, MD;  Location: [facility];  Service: Gynecology;  Laterality: Bilateral;   LUNG BIOPSY     VIDEO BRONCHOSCOPY Bilateral 03/29/2016   Procedure: VIDEO BRONCHOSCOPY WITHOUT FLUORO;  Surgeon: Chilton Greathouse, MD;  Location: [facility];  Service: Cardiopulmonary;  Laterality: Bilateral;    There were no vitals filed for this visit.   Subjective Assessment - 07/07/20 1735     Subjective Reports that she feels much better after the last treatment, less pain    Currently in Pain? Yes    Pain Score 3     Pain Location Back    Pain Relieving Factors the treatment  really helped                               [facility] Adult PT Treatment/Exercise - 07/07/20 0001       Moist Heat Therapy   Number Minutes Moist Heat 12 Minutes    Moist Heat Location Cervical;Lumbar Spine      Electrical Stimulation   Electrical Stimulation Location C/T and lumbar area    Electrical Stimulation Action premod    Electrical Stimulation Parameters supine    Electrical Stimulation Goals Pain      Manual Therapy   Manual Therapy Soft tissue mobilization    Manual therapy comments some PROM of the cervical spine, HS, piriformis and ITB    Soft tissue mobilization worked on the upper traps, the low back and the cervical area, petrissage and efflurage              Trigger Point Dry Needling - 07/07/20 0001     Consent Given? Yes    Upper Trapezius Response Twitch reponse elicited;Palpable increased muscle length    Levator Scapulae Response Twitch response elicited;Palpable increased muscle length                    PT Short Term Goals - 06/17/20 1736       PT SHORT TERM GOAL #1  Title Pt will be independent with her initial HEP    Status Partially Met               PT Long Term Goals - 07/07/20 1740       PT LONG TERM GOAL #1   Title Pt will report pain 4/10 or less in all situations temperature wise in order to improve quality of life.    Status Partially Met      PT LONG TERM GOAL #2   Title increase lumbar ROM 25%    Status Partially Met      PT LONG TERM GOAL #3   Title understand posture and body mechanics    Status On-going      PT LONG TERM GOAL #4   Title indepednent with advanced HEP    Status On-going                   Plan - 07/07/20 1739     Clinical Impression Statement Patient report sthat she feel that the last treatment really helped, I expanded on this with some flexibility. she seemed to tolerate well, she is very tight in the thoracic area and all of her mms    PT Next Visit  Plan continue with the current plan, may add some exercises    Consulted and Agree with Plan of Care Patient             Patient will benefit from skilled therapeutic intervention in order to improve the following deficits and impairments:  Decreased range of motion, Difficulty walking, Impaired tone, Increased muscle spasms, Pain, Improper body mechanics, Impaired flexibility, Decreased strength, Postural dysfunction  Visit Diagnosis: Acute bilateral low back pain without sciatica  Abnormal posture  Muscle spasm of back  Cervicalgia  Pain in thoracic spine  Right cervical radiculopathy     Problem List Patient Active Problem List   Diagnosis Date Noted   Encounter for well adult exam with abnormal findings 03/23/2020   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   Non-traumatic mid back pain 03/23/2020   IBS (irritable bowel syndrome) 03/23/2020   Costochondritis 10/13/2019   Anxiety 10/13/2019   Urinary frequency 06/29/2019   Vitamin D deficiency 10/01/2018   HLD (hyperlipidemia) 10/01/2018   Right cervical radiculopathy 10/01/2018   Trapezius muscle strain, right, initial encounter 10/01/2018   Right carpal tunnel syndrome 10/01/2018   Cerumen impaction 09/07/2017   Acne vulgaris 12/28/2016   Bronchiectasis with acute exacerbation ([place]) 12/22/2016   Medicare annual wellness visit, subsequent 04/28/2016   Preventative health care 04/28/2016   Lung nodules 03/16/2016   Palpitations    GERD (gastroesophageal reflux disease) 07/30/2015   Bronchiectasis without acute exacerbation ([place]) 07/08/2015   COPD (chronic obstructive pulmonary disease) ([place]) 06/04/2015   Fatigue 06/04/2015    Sumner Boast., PT 07/07/2020, 5:41 PM  [place]. [place], [place], [address] Phone: [phone]   Fax:  [phone]  Name: Hannah Barron MRN: [id number] Date of Birth: [birth year]

## 2020-07-14 ENCOUNTER — Encounter: Payer: Medicare HMO | Admitting: Physical Therapy

## 2020-07-17 ENCOUNTER — Encounter: Payer: Self-pay | Admitting: Physical Therapy

## 2020-07-17 ENCOUNTER — Other Ambulatory Visit: Payer: Self-pay

## 2020-07-17 ENCOUNTER — Ambulatory Visit: Payer: Medicare HMO | Attending: Family Medicine | Admitting: Physical Therapy

## 2020-07-17 DIAGNOSIS — M5412 Radiculopathy, cervical region: Secondary | ICD-10-CM

## 2020-07-17 DIAGNOSIS — M62830 Muscle spasm of back: Secondary | ICD-10-CM

## 2020-07-17 DIAGNOSIS — M545 Low back pain, unspecified: Secondary | ICD-10-CM | POA: Diagnosis not present

## 2020-07-17 DIAGNOSIS — M542 Cervicalgia: Secondary | ICD-10-CM | POA: Diagnosis not present

## 2020-07-17 DIAGNOSIS — M546 Pain in thoracic spine: Secondary | ICD-10-CM | POA: Diagnosis not present

## 2020-07-17 DIAGNOSIS — R293 Abnormal posture: Secondary | ICD-10-CM

## 2020-07-17 NOTE — Therapy (Signed)
[place] [place]. [place], [place], [address] Phone: [phone]   Fax:  [phone]  Physical Therapy Treatment  Patient Details  Name: Flynn Lininger MRN: [id number] Date of Birth: [birth year] Referring Provider (PT): Georgina Snell   Encounter Date: 07/17/2020   PT End of Session - 07/17/20 0956     Visit Number 4    Date for PT Re-Evaluation 08/03/20    Authorization Type Aetna    PT Start Time [phone]    PT Stop Time 1010    PT Time Calculation (min) 42 min    Activity Tolerance Patient tolerated treatment well    Behavior During Therapy [facility] for tasks assessed/performed             Past Medical History:  Diagnosis Date   Anemia    Bronchiectasis ([place])    COPD (chronic obstructive pulmonary disease) ([place])    GERD (gastroesophageal reflux disease)    Migraines    Paroxysmal SVT (supraventricular tachycardia) ([place])    a. diagnosed in 11/2015.   Right leg numbness     Past Surgical History:  Procedure Laterality Date   CESAREAN SECTION     CYSTOSCOPY N/A 04/05/2016   Procedure: CYSTOSCOPY;  Surgeon: Lavonia Drafts, MD;  Location: [facility];  Service: Gynecology;  Laterality: N/A;   LAPAROSCOPIC VAGINAL HYSTERECTOMY WITH SALPINGECTOMY Bilateral 04/05/2016   Procedure: LAPAROSCOPIC ASSISTED VAGINAL HYSTERECTOMY WITH SALPINGECTOMY;  Surgeon: Lavonia Drafts, MD;  Location: [facility];  Service: Gynecology;  Laterality: Bilateral;   LUNG BIOPSY     VIDEO BRONCHOSCOPY Bilateral 03/29/2016   Procedure: VIDEO BRONCHOSCOPY WITHOUT FLUORO;  Surgeon: Marshell Garfinkel, MD;  [facility]: [facility];  Service: Cardiopulmonary;  Laterality: Bilateral;    There were no vitals filed for this visit.   Subjective Assessment - 07/17/20 0954     Subjective Patient reports continues to improve, she does report some right rib area pain, she is very tender here    Currently in Pain? Yes    Pain Score 2     Pain Location Neck    Pain  Descriptors / Indicators Tightness;Spasm    Pain Relieving Factors reports that she really feels like the treatment helps                               [facility] Adult PT Treatment/Exercise - 07/17/20 0001       Exercises   Exercises Other Exercises    Other Exercises  did some deep breathing exercises with some resisted breathing and lat stretches to try to help with the ribs pain, did shrugs and scapular retraction      Moist Heat Therapy   Number Minutes Moist Heat 12 Minutes    Moist Heat Location Cervical;Lumbar Spine      Electrical Stimulation   Electrical Stimulation Location C/T area    Electrical Stimulation Action IFC    Electrical Stimulation Parameters supine    Electrical Stimulation Goals Pain      Manual Therapy   Manual Therapy Soft tissue mobilization    Manual therapy comments some PROM of the cervical spine, HS, piriformis and ITB    Soft tissue mobilization worked on the upper traps, the low back and the cervical area, petrissage and efflurage                      PT Short Term Goals - 06/17/20 1736  PT SHORT TERM GOAL #1   Title Pt will be independent with her initial HEP    Status Partially Met               PT Long Term Goals - 07/17/20 1002       PT LONG TERM GOAL #1   Title Pt will report pain 4/10 or less in all situations temperature wise in order to improve quality of life.    Status Partially Met      PT LONG TERM GOAL #2   Title increase lumbar ROM 25%    Status Partially Met      PT LONG TERM GOAL #3   Title understand posture and body mechanics    Status Partially Met      PT LONG TERM GOAL #4   Title indepednent with advanced HEP    Status On-going                   Plan - 07/17/20 1001     Clinical Impression Statement She reports that she has not had much back pain since the last visit, still pain in the upper traps and some right rib area pain, she is tender where the  serratus anterior is, had some pain with deep breathing, I did some breathing and resisted breathing exercises with her to see if this would hlp this area    PT Next Visit Plan continue with the current plan, may add some exercises    Consulted and Agree with Plan of Care Patient             Patient will benefit from skilled therapeutic intervention in order to improve the following deficits and impairments:  Decreased range of motion, Difficulty walking, Impaired tone, Increased muscle spasms, Pain, Improper body mechanics, Impaired flexibility, Decreased strength, Postural dysfunction  Visit Diagnosis: Acute bilateral low back pain without sciatica  Abnormal posture  Muscle spasm of back  Cervicalgia  Pain in thoracic spine  Right cervical radiculopathy     Problem List Patient Active Problem List   Diagnosis Date Noted   Encounter for well adult exam with abnormal findings 03/23/2020   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   Non-traumatic mid back pain 03/23/2020   IBS (irritable bowel syndrome) 03/23/2020   Costochondritis 10/13/2019   Anxiety 10/13/2019   Urinary frequency 06/29/2019   Vitamin D deficiency 10/01/2018   HLD (hyperlipidemia) 10/01/2018   Right cervical radiculopathy 10/01/2018   Trapezius muscle strain, right, initial encounter 10/01/2018   Right carpal tunnel syndrome 10/01/2018   Cerumen impaction 09/07/2017   Acne vulgaris 12/28/2016   Bronchiectasis with acute exacerbation ([place]) 12/22/2016   Medicare annual wellness visit, subsequent 04/28/2016   Preventative health care 04/28/2016   Lung nodules 03/16/2016   Palpitations    GERD (gastroesophageal reflux disease) 07/30/2015   Bronchiectasis without acute exacerbation ([place]) 07/08/2015   COPD (chronic obstructive pulmonary disease) ([place]) 06/04/2015   Fatigue 06/04/2015    Sumner Boast., PT 07/17/2020, 10:03 AM  [place]. [place], [place], [address] Phone: [phone]   Fax:  [phone]  Name: Lenita Peregrina MRN: [id number] Date of Birth: [birth year]

## 2020-07-22 ENCOUNTER — Ambulatory Visit: Payer: Medicare HMO | Admitting: Physical Therapy

## 2020-07-29 ENCOUNTER — Other Ambulatory Visit: Payer: Self-pay

## 2020-07-29 ENCOUNTER — Ambulatory Visit: Payer: Medicare HMO | Admitting: Physical Therapy

## 2020-07-29 ENCOUNTER — Encounter: Payer: Self-pay | Admitting: Physical Therapy

## 2020-07-29 DIAGNOSIS — M545 Low back pain, unspecified: Secondary | ICD-10-CM | POA: Diagnosis not present

## 2020-07-29 DIAGNOSIS — R293 Abnormal posture: Secondary | ICD-10-CM

## 2020-07-29 DIAGNOSIS — M546 Pain in thoracic spine: Secondary | ICD-10-CM

## 2020-07-29 DIAGNOSIS — M542 Cervicalgia: Secondary | ICD-10-CM

## 2020-07-29 DIAGNOSIS — M5412 Radiculopathy, cervical region: Secondary | ICD-10-CM | POA: Diagnosis not present

## 2020-07-29 DIAGNOSIS — M62830 Muscle spasm of back: Secondary | ICD-10-CM

## 2020-07-29 NOTE — Therapy (Signed)
[address]. [place], [place], [address] Phone: [phone]   Fax:  [phone]  Physical Therapy Treatment  Patient Details  Name: Hannah Barron MRN: [id number] Date of Birth: 1970/03/13 Referring Provider (PT): Georgina Snell   Encounter Date: 07/29/2020   PT End of Session - 07/29/20 1738     Visit Number 5    Date for PT Re-Evaluation 08/03/20    Authorization Type Aetna    PT Start Time 1700    PT Stop Time 2706    PT Time Calculation (min) 45 min    Activity Tolerance Patient tolerated treatment well    Behavior During Therapy [facility] for tasks assessed/performed             Past Medical History:  Diagnosis Date   Anemia    Bronchiectasis ([place])    COPD (chronic obstructive pulmonary disease) ([place])    GERD (gastroesophageal reflux disease)    Migraines    Paroxysmal SVT (supraventricular tachycardia) ([place])    a. diagnosed in 11/2015.   Right leg numbness     Past Surgical History:  Procedure Laterality Date   CESAREAN SECTION     CYSTOSCOPY N/A 04/05/2016   Procedure: CYSTOSCOPY;  Surgeon: Lavonia Drafts, MD;  Location: [facility];  Service: Gynecology;  Laterality: N/A;   LAPAROSCOPIC VAGINAL HYSTERECTOMY WITH SALPINGECTOMY Bilateral 04/05/2016   Procedure: LAPAROSCOPIC ASSISTED VAGINAL HYSTERECTOMY WITH SALPINGECTOMY;  Surgeon: Lavonia Drafts, MD;  Location: [facility];  Service: Gynecology;  Laterality: Bilateral;   LUNG BIOPSY     VIDEO BRONCHOSCOPY Bilateral 03/29/2016   Procedure: VIDEO BRONCHOSCOPY WITHOUT FLUORO;  Surgeon: Marshell Garfinkel, MD;  [facility];  Service: Cardiopulmonary;  Laterality: Bilateral;    There were no vitals filed for this visit.   Subjective Assessment - 07/29/20 1737     Subjective Patients father passed away unexpectedly last week, she reports a lot of stress and increased shoulder tightness and pain    Currently in Pain? Yes    Pain Score 5     Pain  Location Neck    Pain Descriptors / Indicators Tightness;Spasm    Aggravating Factors  stress                               [facility] Adult PT Treatment/Exercise - 07/29/20 0001       Moist Heat Therapy   Number Minutes Moist Heat 12 Minutes    Moist Heat Location Cervical;Lumbar Spine      Electrical Stimulation   Electrical Stimulation Location C/T area    Electrical Stimulation Action IFC    Electrical Stimulation Parameters supine    Electrical Stimulation Goals Pain      Manual Therapy   Manual Therapy Soft tissue mobilization    Manual therapy comments some PROM of the cervical spine, HS, piriformis and ITB    Soft tissue mobilization worked on the upper traps, the low back and the cervical area, petrissage and efflurage                      PT Short Term Goals - 06/17/20 1736       PT SHORT TERM GOAL #1   Title Pt will be independent with her initial HEP    Status Partially Met               PT Long Term Goals - 07/29/20 1740  PT LONG TERM GOAL #1   Title Pt will report pain 4/10 or less in all situations temperature wise in order to improve quality of life.    Status Partially Met      PT LONG TERM GOAL #2   Title increase lumbar ROM 25%    Status Partially Met      PT LONG TERM GOAL #3   Title understand posture and body mechanics    Status Partially Met                   Plan - 07/29/20 1738     Clinical Impression Statement Patient was doing much better until last week, her father passed away unexpectedly, she reports a lot of stress and worries, she reports increased pain and spasms in the neck and shoulder area.  She was extremely tight in the upper traps and very tender.    PT Next Visit Plan continue with the current plan, may add some exercises    Consulted and Agree with Plan of Care Patient             Patient will benefit from skilled therapeutic intervention in order to improve the  following deficits and impairments:  Decreased range of motion, Difficulty walking, Impaired tone, Increased muscle spasms, Pain, Improper body mechanics, Impaired flexibility, Decreased strength, Postural dysfunction  Visit Diagnosis: Acute bilateral low back pain without sciatica  Abnormal posture  Muscle spasm of back  Cervicalgia  Pain in thoracic spine  Right cervical radiculopathy     Problem List Patient Active Problem List   Diagnosis Date Noted   Encounter for well adult exam with abnormal findings 03/23/2020   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   Non-traumatic mid back pain 03/23/2020   IBS (irritable bowel syndrome) 03/23/2020   Costochondritis 10/13/2019   Anxiety 10/13/2019   Urinary frequency 06/29/2019   Vitamin D deficiency 10/01/2018   HLD (hyperlipidemia) 10/01/2018   Right cervical radiculopathy 10/01/2018   Trapezius muscle strain, right, initial encounter 10/01/2018   Right carpal tunnel syndrome 10/01/2018   Cerumen impaction 09/07/2017   Acne vulgaris 12/28/2016   Bronchiectasis with acute exacerbation ([place]) 12/22/2016   Medicare annual wellness visit, subsequent 04/28/2016   Preventative health care 04/28/2016   Lung nodules 03/16/2016   Palpitations    GERD (gastroesophageal reflux disease) 07/30/2015   Bronchiectasis without acute exacerbation ([place]) 07/08/2015   COPD (chronic obstructive pulmonary disease) ([place]) 06/04/2015   Fatigue 06/04/2015    Sumner Boast., PT 07/29/2020, 5:40 PM  [place]. [place], [place], [address] Phone: [phone]   Fax:  [phone]  Name: Hannah Barron MRN: [id number] Date of Birth: 1970-12-08

## 2020-08-03 ENCOUNTER — Ambulatory Visit (INDEPENDENT_AMBULATORY_CARE_PROVIDER_SITE_OTHER): Payer: Medicare HMO | Admitting: Internal Medicine

## 2020-08-03 ENCOUNTER — Encounter: Payer: Self-pay | Admitting: Internal Medicine

## 2020-08-03 ENCOUNTER — Other Ambulatory Visit: Payer: Self-pay

## 2020-08-03 VITALS — BP 118/68 | HR 76 | Ht 67.0 in | Wt 170.0 lb

## 2020-08-03 DIAGNOSIS — R1013 Epigastric pain: Secondary | ICD-10-CM | POA: Insufficient documentation

## 2020-08-03 DIAGNOSIS — F419 Anxiety disorder, unspecified: Secondary | ICD-10-CM | POA: Diagnosis not present

## 2020-08-03 DIAGNOSIS — Z7184 Encounter for health counseling related to travel: Secondary | ICD-10-CM | POA: Diagnosis not present

## 2020-08-03 DIAGNOSIS — F4321 Adjustment disorder with depressed mood: Secondary | ICD-10-CM

## 2020-08-03 DIAGNOSIS — R69 Illness, unspecified: Secondary | ICD-10-CM | POA: Diagnosis not present

## 2020-08-03 MED ORDER — OMEPRAZOLE 40 MG PO CPDR: 40.0000 mg | DELAYED_RELEASE_CAPSULE | Freq: Every day | ORAL | 3 refills | Status: DC

## 2020-08-03 MED ORDER — CLONAZEPAM 0.5 MG PO TABS: 0.5000 mg | ORAL_TABLET | Freq: Two times a day (BID) | ORAL | 0 refills | Status: AC | PRN

## 2020-08-03 MED ORDER — NAPROXEN 500 MG PO TABS: 500.0000 mg | ORAL_TABLET | Freq: Two times a day (BID) | ORAL | 3 refills | Status: DC

## 2020-08-03 MED ORDER — DICYCLOMINE HCL 20 MG PO TABS: 20.0000 mg | ORAL_TABLET | Freq: Four times a day (QID) | ORAL | 5 refills | Status: DC

## 2020-08-03 MED ORDER — ATORVASTATIN CALCIUM 20 MG PO TABS: 20.0000 mg | ORAL_TABLET | Freq: Every day | ORAL | 3 refills | Status: DC

## 2020-08-03 MED ORDER — MEFLOQUINE HCL 250 MG PO TABS: 250.0000 mg | ORAL_TABLET | ORAL | 0 refills | Status: AC

## 2020-08-03 NOTE — Patient Instructions (Addendum)
Please take all new medication as prescribed - the larium (mefloquin) weekly starting now for 6 wks  Please continue all other medications as before, and refills have been done if requested including the clonazepam for nerves  Please have the pharmacy call with any other refills you may need.  Please continue your efforts at being more active, low cholesterol diet, and weight control.  Please keep your appointments with your specialists as you may have planned  You will be contacted regarding the referral for: Gastroenterology  Please make an Appointment to return in 6 months, or sooner if needed

## 2020-08-03 NOTE — Assessment & Plan Note (Signed)
D/w pt, sudden onset, at least mild to mod, declines counseling for now

## 2020-08-03 NOTE — Progress Notes (Signed)
Chief Complaint: follow up grieving, travel to [place] soon, anxiety and epigastric pain       HPI:  Hannah Barron is a 50 y.o. female here after father died in his sleep in [place] 4 days ago.  Need to leave for [place] herself on Sunday july 31, need mefloquine prevention; also grieving but Denies worsening depressive symptoms, suicidal ideation, or panic; has ongoing anxiety, some increased recently even before her father's passing. Has also had mild worsening reflux and epigastric pain mild intemrittent dull without radation, but no dysphagia, n/v, bowel change or blood.  Nothinig seems to make better or worse.  Asks for GI referral.  Pt denies chest pain, increased sob or doe, wheezing, orthopnea, PND, increased LE swelling, palpitations, dizziness or syncope.   Pt denies polydipsia, polyuria, or new focal neuro s/s.        Wt Readings from Last 3 Encounters:  08/03/20 170 lb (77.1 kg)  05/08/20 166 lb 12.8 oz (75.7 kg)  04/13/20 164 lb 12.8 oz (74.8 kg)   BP Readings from Last 3 Encounters:  08/03/20 118/68  05/08/20 110/78  04/13/20 97/61         Past Medical History:  Diagnosis Date   Anemia    Bronchiectasis (HCC)    COPD (chronic obstructive pulmonary disease) (HCC)    GERD (gastroesophageal reflux disease)    Migraines    Paroxysmal SVT (supraventricular tachycardia) ([place])    a. diagnosed in 11/2015.   Right leg numbness    Past Surgical History:  Procedure Laterality Date   CESAREAN SECTION     CYSTOSCOPY N/A 04/05/2016   Procedure: CYSTOSCOPY;  Surgeon: Lavonia Drafts, MD;  Location: [facility];  Service: Gynecology;  Laterality: N/A;   LAPAROSCOPIC VAGINAL HYSTERECTOMY WITH SALPINGECTOMY Bilateral 04/05/2016   Procedure: LAPAROSCOPIC ASSISTED VAGINAL HYSTERECTOMY WITH SALPINGECTOMY;  Surgeon: Lavonia Drafts, MD;  Location: [facility];  Service: Gynecology;  Laterality: Bilateral;   LUNG BIOPSY     VIDEO BRONCHOSCOPY Bilateral 03/29/2016   Procedure: VIDEO  BRONCHOSCOPY WITHOUT FLUORO;  Surgeon: Marshell Garfinkel, MD;  [facility];  Service: Cardiopulmonary;  Laterality: Bilateral;    reports that she has never smoked. She has never used smokeless tobacco. She reports that she does not drink alcohol and does not use drugs. family history includes Healthy in her father and mother. No Known Allergies Current Outpatient Medications on File Prior to Visit  Medication Sig Dispense Refill   albuterol (VENTOLIN HFA) 108 (90 Base) MCG/ACT inhaler INHALE 2 PUFFS INTO THE LUNGS EVERY 6 HOURS AS NEEDED FOR WHEEZING OR SHORTNESS OF BREATH 8 each 2   budesonide-formoterol (SYMBICORT) 160-4.5 MCG/ACT inhaler Inhale 2 puffs into the lungs 2 (two) times daily. 1 each 5   gabapentin (NEURONTIN) 100 MG capsule Take 1 capsule (100 mg total) by mouth 3 (three) times daily as needed. 90 capsule 3   metoprolol succinate (TOPROL-XL) 50 MG 24 hr tablet TAKE 1 TABLET IN MORNING & 1/2 TABLET IN EVENING *TAKE 1/2 TAB AS NEEDED FOR PALPITATIONS/RAPID HR* 180 tablet 3   prednisoLONE acetate (PRED FORTE) 1 % ophthalmic suspension INSTILL 1 DROP INTO BOTH EYES FOUR TIMES A DAY AS DIRECTED X 1WK THEN TWICE DAILY X 1WK     Respiratory Therapy Supplies (FLUTTER) DEVI Use as directed. 1 each 0   RESTASIS 0.05 % ophthalmic emulsion Instill 1 drop into both eyes two times a day  2   cyclobenzaprine (FLEXERIL) 5 MG tablet Take 1 tablet (5  mg total) by mouth 3 (three) times daily as needed for muscle spasms. (Patient not taking: No sig reported) 60 tablet 2   No current facility-administered medications on file prior to visit.        ROS:  All others reviewed and negative.  Objective        PE:  BP 118/68   Pulse 76   Ht '5\' 7"'$  (1.702 m)   Wt 170 lb (77.1 kg)   LMP 03/31/2016   BMI 26.63 kg/m                 Constitutional: Pt appears in NAD               HENT: Head: NCAT.                Right Ear: External ear normal.                 Left Ear: External ear normal.                 Eyes: . Pupils are equal, round, and reactive to light. Conjunctivae and EOM are normal               Nose: without d/c or deformity               Neck: Neck supple. Gross normal ROM               Cardiovascular: Normal rate and regular rhythm.                 Pulmonary/Chest: Effort normal and breath sounds without rales or wheezing.                Abd:  Soft, NT, ND, + BS, no organomegaly               Neurological: Pt is alert. At baseline orientation, motor grossly intact               Skin: Skin is warm. No rashes, no other new lesions, LE edema - none               Psychiatric: Pt behavior is normal without agitation , + depressed nervous affect  Micro: none  Cardiac tracings I have personally interpreted today:  none  Pertinent Radiological findings (summarize): none   Lab Results  Component Value Date   WBC 5.9 03/23/2020   HGB 12.7 03/23/2020   HCT 36.9 03/23/2020   PLT 250.0 03/23/2020   GLUCOSE 82 03/23/2020   CHOL 136 03/23/2020   TRIG 54.0 03/23/2020   HDL 63.90 03/23/2020   LDLCALC 61 03/23/2020   ALT 21 03/23/2020   AST 22 03/23/2020   NA 138 03/23/2020   K 3.8 03/23/2020   CL 103 03/23/2020   CREATININE 0.60 03/23/2020   BUN 9 03/23/2020   CO2 28 03/23/2020   TSH 0.70 03/23/2020   INR 1.1 10/27/2014   HGBA1C 5.8 03/23/2020   Assessment/Plan:  Hannah Barron is a 50 y.o. Other or two or more races [6] female with  has a past medical history of Anemia, Bronchiectasis ([place]), COPD (chronic obstructive pulmonary disease) ([place]), GERD (gastroesophageal reflux disease), Migraines, Paroxysmal SVT (supraventricular tachycardia) ([place]), and Right leg numbness.  Grief reaction D/w pt, sudden onset, at least mild to mod, declines counseling for now   Epigastric pain Etiology unclear, continue PPI and refer GI per pt request  Anxiety Mild to mod, for clonazepam prn,  to  f/u any worsening symptoms or concerns  Travel advice encounter Pt plans 3 wks in  [place]; now for larium prophylaxis,  to f/u any worsening symptoms or concerns  Followup: Return in about 6 months (around 02/03/2021).  Cathlean Cower, MD 08/03/2020 9:23 PM [facility]

## 2020-08-03 NOTE — Assessment & Plan Note (Signed)
Pt plans 3 wks in [place]; now for larium prophylaxis,  to f/u any worsening symptoms or concerns

## 2020-08-03 NOTE — Assessment & Plan Note (Signed)
Etiology unclear, continue PPI and refer GI per pt request

## 2020-08-03 NOTE — Assessment & Plan Note (Signed)
Mild to mod, for clonazepam prn,  to f/u any worsening symptoms or concerns

## 2020-08-05 ENCOUNTER — Encounter: Payer: Self-pay | Admitting: Physical Therapy

## 2020-08-05 ENCOUNTER — Other Ambulatory Visit: Payer: Self-pay

## 2020-08-05 ENCOUNTER — Ambulatory Visit: Payer: Medicare HMO | Admitting: Physical Therapy

## 2020-08-05 DIAGNOSIS — M542 Cervicalgia: Secondary | ICD-10-CM

## 2020-08-05 DIAGNOSIS — M545 Low back pain, unspecified: Secondary | ICD-10-CM

## 2020-08-05 DIAGNOSIS — M5412 Radiculopathy, cervical region: Secondary | ICD-10-CM

## 2020-08-05 DIAGNOSIS — M62830 Muscle spasm of back: Secondary | ICD-10-CM | POA: Diagnosis not present

## 2020-08-05 DIAGNOSIS — M546 Pain in thoracic spine: Secondary | ICD-10-CM | POA: Diagnosis not present

## 2020-08-05 DIAGNOSIS — R293 Abnormal posture: Secondary | ICD-10-CM

## 2020-08-05 NOTE — Therapy (Signed)
[facility] [place], [place], [address] Phone: [phone]   Fax:  [phone]  Physical Therapy Treatment  Patient Details  Name: Hannah Barron MRN: [id number] Date of Birth: 23-Nov-1970 Referring Provider (PT): Georgina Snell   Encounter Date: 08/05/2020   PT End of Session - 08/05/20 1719     Visit Number 6    Date for PT Re-Evaluation 09/05/20    Authorization Type [employer]    PT Start Time 1650    PT Stop Time 6767    PT Time Calculation (min) 45 min    Activity Tolerance Patient tolerated treatment well    Behavior During Therapy [facility] for tasks assessed/performed             Past Medical History:  Diagnosis Date   Anemia    Bronchiectasis ([place])    COPD (chronic obstructive pulmonary disease) ([place])    GERD (gastroesophageal reflux disease)    Migraines    Paroxysmal SVT (supraventricular tachycardia) ([place])    a. diagnosed in 11/2015.   Right leg numbness     Past Surgical History:  Procedure Laterality Date   CESAREAN SECTION     CYSTOSCOPY N/A 04/05/2016   Procedure: CYSTOSCOPY;  Surgeon: Lavonia Drafts, MD;  Location: [facility];  Service: Gynecology;  Laterality: N/A;   LAPAROSCOPIC VAGINAL HYSTERECTOMY WITH SALPINGECTOMY Bilateral 04/05/2016   Procedure: LAPAROSCOPIC ASSISTED VAGINAL HYSTERECTOMY WITH SALPINGECTOMY;  Surgeon: Lavonia Drafts, MD;  Location: [facility];  Service: Gynecology;  Laterality: Bilateral;   LUNG BIOPSY     VIDEO BRONCHOSCOPY Bilateral 03/29/2016   Procedure: VIDEO BRONCHOSCOPY WITHOUT FLUORO;  Surgeon: Marshell Garfinkel, MD;  Location: [facility];  Service: Cardiopulmonary;  Laterality: Bilateral;    There were no vitals filed for this visit.   Subjective Assessment - 08/05/20 1717     Subjective Patient reports that she is very sore, "working too much"    Currently in Pain? Yes    Pain Score 5     Pain Location Neck    Pain Descriptors / Indicators Sore;Tightness;Spasm     Aggravating Factors  stress                               [facility] Adult PT Treatment/Exercise - 08/05/20 0001       Moist Heat Therapy   Number Minutes Moist Heat 12 Minutes    Moist Heat Location Cervical;Lumbar Spine      Electrical Stimulation   Electrical Stimulation Location C/T area    Electrical Stimulation Action IFC    Electrical Stimulation Parameters supine    Electrical Stimulation Goals Pain      Manual Therapy   Manual Therapy Soft tissue mobilization;Manual Traction    Manual therapy comments some PROM of the cervical spine, HS, piriformis and ITB    Soft tissue mobilization worked on the upper traps, the low back and the cervical area, petrissage and efflurage    Manual Traction occipital release                      PT Short Term Goals - 06/17/20 1736       PT SHORT TERM GOAL #1   Title Pt will be independent with her initial HEP    Status Partially Met               PT Long Term Goals - 08/05/20 1722  PT LONG TERM GOAL #1   Title Pt will report pain 4/10 or less in all situations temperature wise in order to improve quality of life.    Status Partially Met      PT LONG TERM GOAL #2   Title increase lumbar ROM 25%    Status Partially Met      PT LONG TERM GOAL #3   Title understand posture and body mechanics    Status Partially Met      PT LONG TERM GOAL #4   Title indepednent with advanced HEP    Status On-going                   Plan - 08/05/20 1720     Clinical Impression Statement Patient had her father pass away two weeks ago, she will be flying to [place] this weekend, she is working all this week, she reports a lot of stress and bickering with her family in [place].  She is having increased pain, she has tremendous tightness in the upper trpas and neck area, she seems to be more tender today    PT Frequency 2x / week    PT Duration 4 weeks    PT Treatment/Interventions ADLs/Self Care  Home Management;Electrical Stimulation;Moist Heat;Traction;Ultrasound;Functional mobility training;Therapeutic activities;Therapeutic exercise;Balance training;Neuromuscular re-education;Manual techniques;Dry needling;Patient/family education    PT Next Visit Plan she will be in [place] for the next month attending her dad's funeral, when she returns would love to see how she is doing    Consulted and Agree with Plan of Care Patient             Patient will benefit from skilled therapeutic intervention in order to improve the following deficits and impairments:  Decreased range of motion, Difficulty walking, Impaired tone, Increased muscle spasms, Pain, Improper body mechanics, Impaired flexibility, Decreased strength, Postural dysfunction  Visit Diagnosis: Acute bilateral low back pain without sciatica  Abnormal posture  Muscle spasm of back  Cervicalgia  Pain in thoracic spine  Right cervical radiculopathy     Problem List Patient Active Problem List   Diagnosis Date Noted   Grief reaction 08/03/2020   Epigastric pain 08/03/2020   Travel advice encounter 08/03/2020   Encounter for well adult exam with abnormal findings 03/23/2020   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   Non-traumatic mid back pain 03/23/2020   IBS (irritable bowel syndrome) 03/23/2020   Costochondritis 10/13/2019   Anxiety 10/13/2019   Urinary frequency 06/29/2019   Vitamin D deficiency 10/01/2018   HLD (hyperlipidemia) 10/01/2018   Right cervical radiculopathy 10/01/2018   Trapezius muscle strain, right, initial encounter 10/01/2018   Right carpal tunnel syndrome 10/01/2018   Cerumen impaction 09/07/2017   Acne vulgaris 12/28/2016   Bronchiectasis with acute exacerbation ([place]) 12/22/2016   Medicare annual wellness visit, subsequent 04/28/2016   Preventative health care 04/28/2016   Lung nodules 03/16/2016   Palpitations    GERD (gastroesophageal reflux disease) 07/30/2015   Bronchiectasis without acute  exacerbation ([place]) 07/08/2015   COPD (chronic obstructive pulmonary disease) ([place]) 06/04/2015   Fatigue 06/04/2015    Sumner Boast., PT 08/05/2020, 5:23 PM  [facility]. [place], [place], [address] Phone: [phone]   Fax:  [phone]  Name: Hannah Barron MRN: [id number] Date of Birth: 12/21/1970

## 2020-08-12 ENCOUNTER — Ambulatory Visit: Payer: Medicare HMO | Admitting: Physical Therapy

## 2020-10-27 ENCOUNTER — Encounter: Payer: Self-pay | Admitting: Pulmonary Disease

## 2020-10-27 ENCOUNTER — Ambulatory Visit (INDEPENDENT_AMBULATORY_CARE_PROVIDER_SITE_OTHER): Payer: Medicare HMO | Admitting: Internal Medicine

## 2020-10-27 ENCOUNTER — Ambulatory Visit: Payer: Medicare HMO | Admitting: Pulmonary Disease

## 2020-10-27 ENCOUNTER — Ambulatory Visit (INDEPENDENT_AMBULATORY_CARE_PROVIDER_SITE_OTHER): Payer: Medicare HMO

## 2020-10-27 ENCOUNTER — Other Ambulatory Visit: Payer: Self-pay

## 2020-10-27 ENCOUNTER — Encounter: Payer: Self-pay | Admitting: Internal Medicine

## 2020-10-27 VITALS — BP 120/68 | HR 93 | Ht 67.0 in | Wt 168.6 lb

## 2020-10-27 VITALS — BP 122/66 | HR 91 | Temp 98.2°F | Ht 67.0 in | Wt 167.8 lb

## 2020-10-27 DIAGNOSIS — F5101 Primary insomnia: Secondary | ICD-10-CM | POA: Diagnosis not present

## 2020-10-27 DIAGNOSIS — H16223 Keratoconjunctivitis sicca, not specified as Sjogren's, bilateral: Secondary | ICD-10-CM | POA: Diagnosis not present

## 2020-10-27 DIAGNOSIS — G4700 Insomnia, unspecified: Secondary | ICD-10-CM | POA: Insufficient documentation

## 2020-10-27 DIAGNOSIS — M5416 Radiculopathy, lumbar region: Secondary | ICD-10-CM | POA: Insufficient documentation

## 2020-10-27 DIAGNOSIS — Z23 Encounter for immunization: Secondary | ICD-10-CM

## 2020-10-27 DIAGNOSIS — J479 Bronchiectasis, uncomplicated: Secondary | ICD-10-CM

## 2020-10-27 DIAGNOSIS — R079 Chest pain, unspecified: Secondary | ICD-10-CM | POA: Diagnosis not present

## 2020-10-27 DIAGNOSIS — R0789 Other chest pain: Secondary | ICD-10-CM | POA: Diagnosis not present

## 2020-10-27 DIAGNOSIS — R0781 Pleurodynia: Secondary | ICD-10-CM | POA: Diagnosis not present

## 2020-10-27 DIAGNOSIS — R69 Illness, unspecified: Secondary | ICD-10-CM | POA: Diagnosis not present

## 2020-10-27 IMAGING — DX DG RIBS 2V*L*
2 series · 2 of 2 positions shown · non-contrast
Comparison: [DATE]

CLINICAL DATA: Left-sided rib pain for 3 weeks, no known injury,
initial encounter

EXAM:
LEFT RIBS - 2 VIEW

[rib obl]
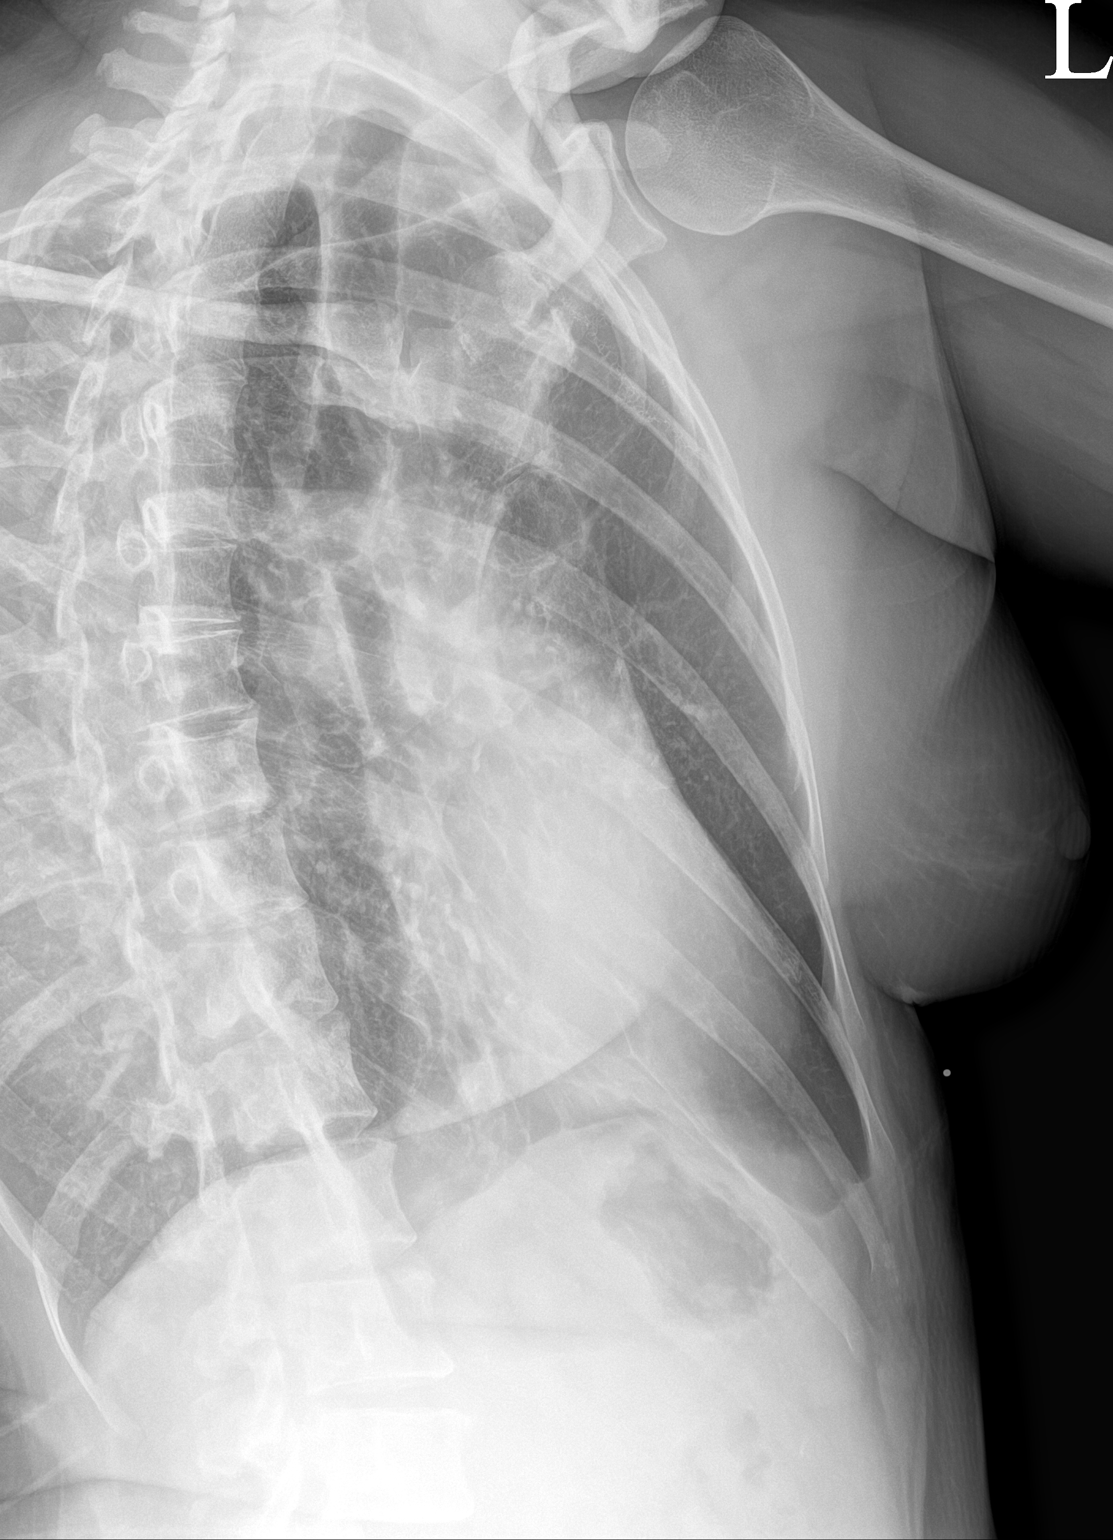

[rib pa]
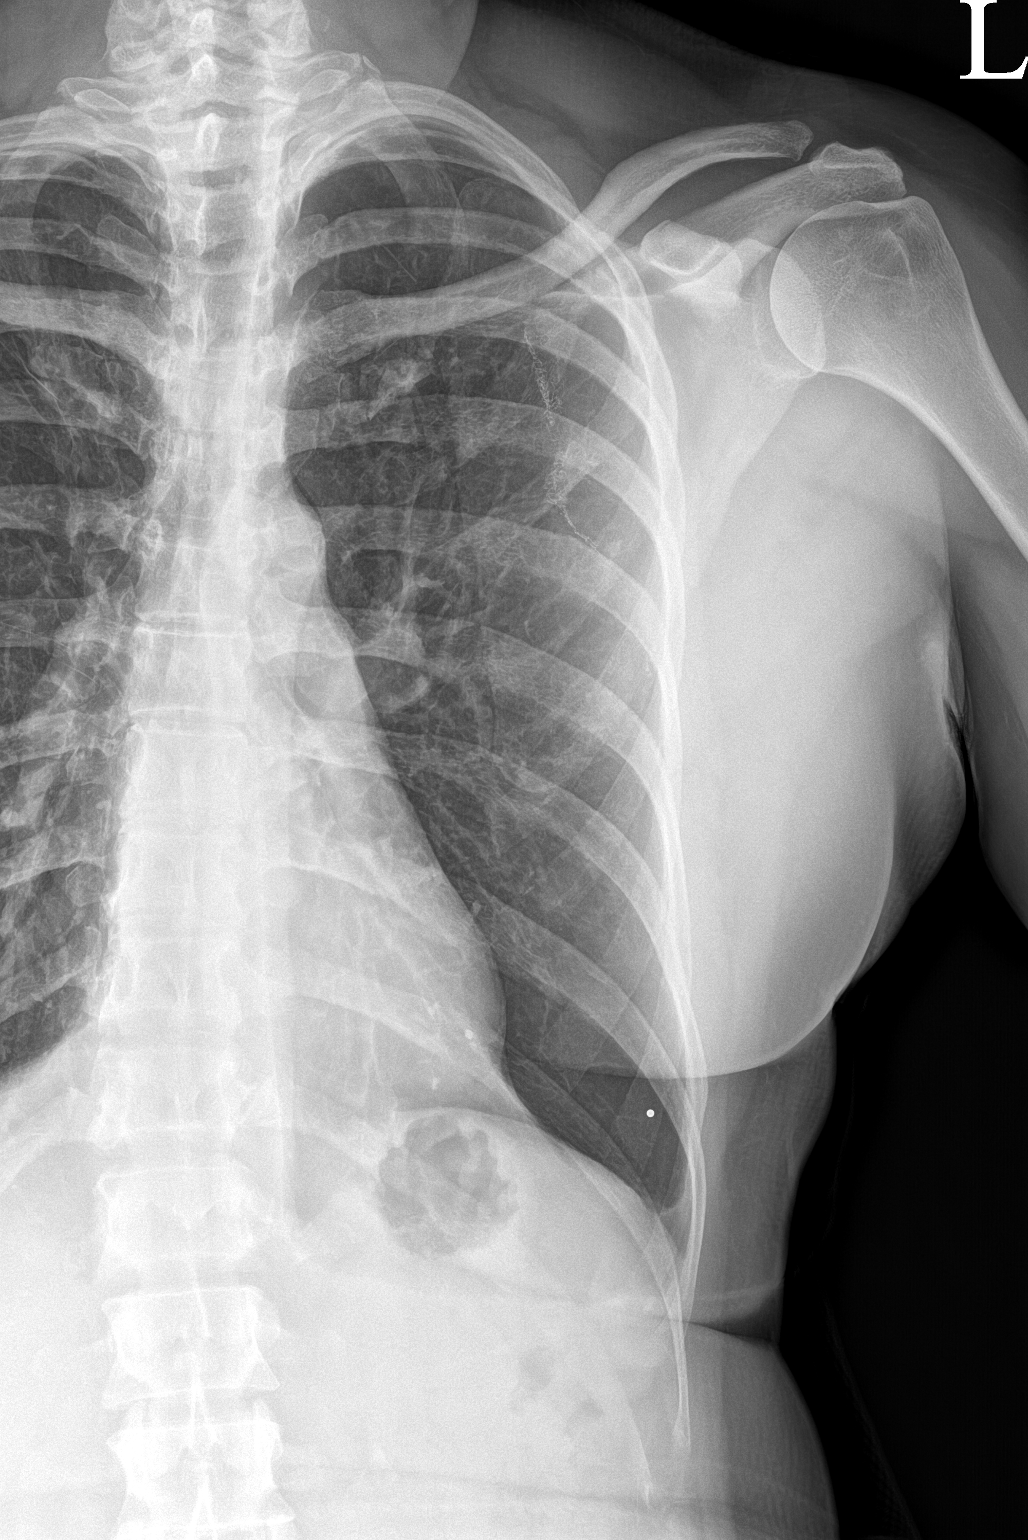

[2 of 2 positions shown; findings below may reference images not displayed]

FINDINGS: Postsurgical changes are noted in the left upper lobe. No acute rib
abnormality is seen. No soft tissue abnormality is noted.
IMPRESSION: Postsurgical changes in the left upper lobe. No acute rib
abnormality noted.

## 2020-10-27 IMAGING — DX DG CHEST 2V
2 series · 2 of 2 positions shown · non-contrast
Comparison: [DATE]

CLINICAL DATA: Left-sided chest pain, no known injury, initial
encounter

EXAM:
CHEST - 2 VIEW

[chest pa]
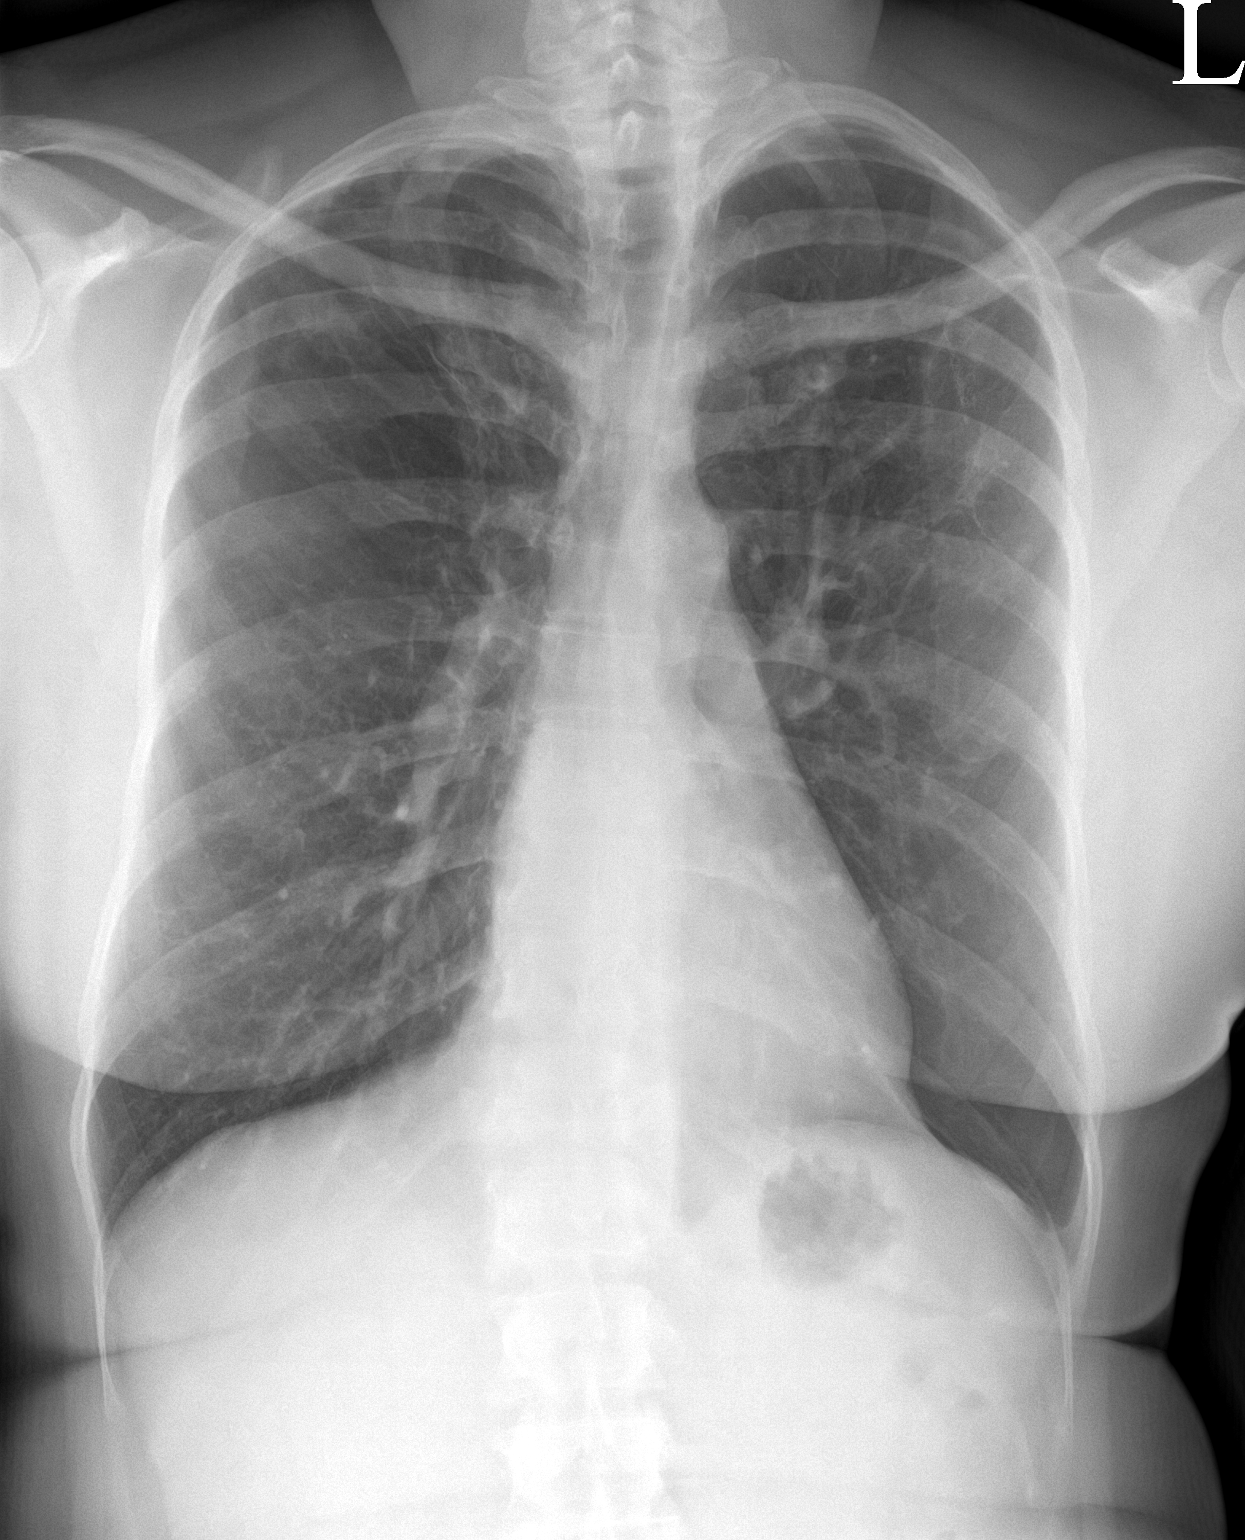

[chest lat]
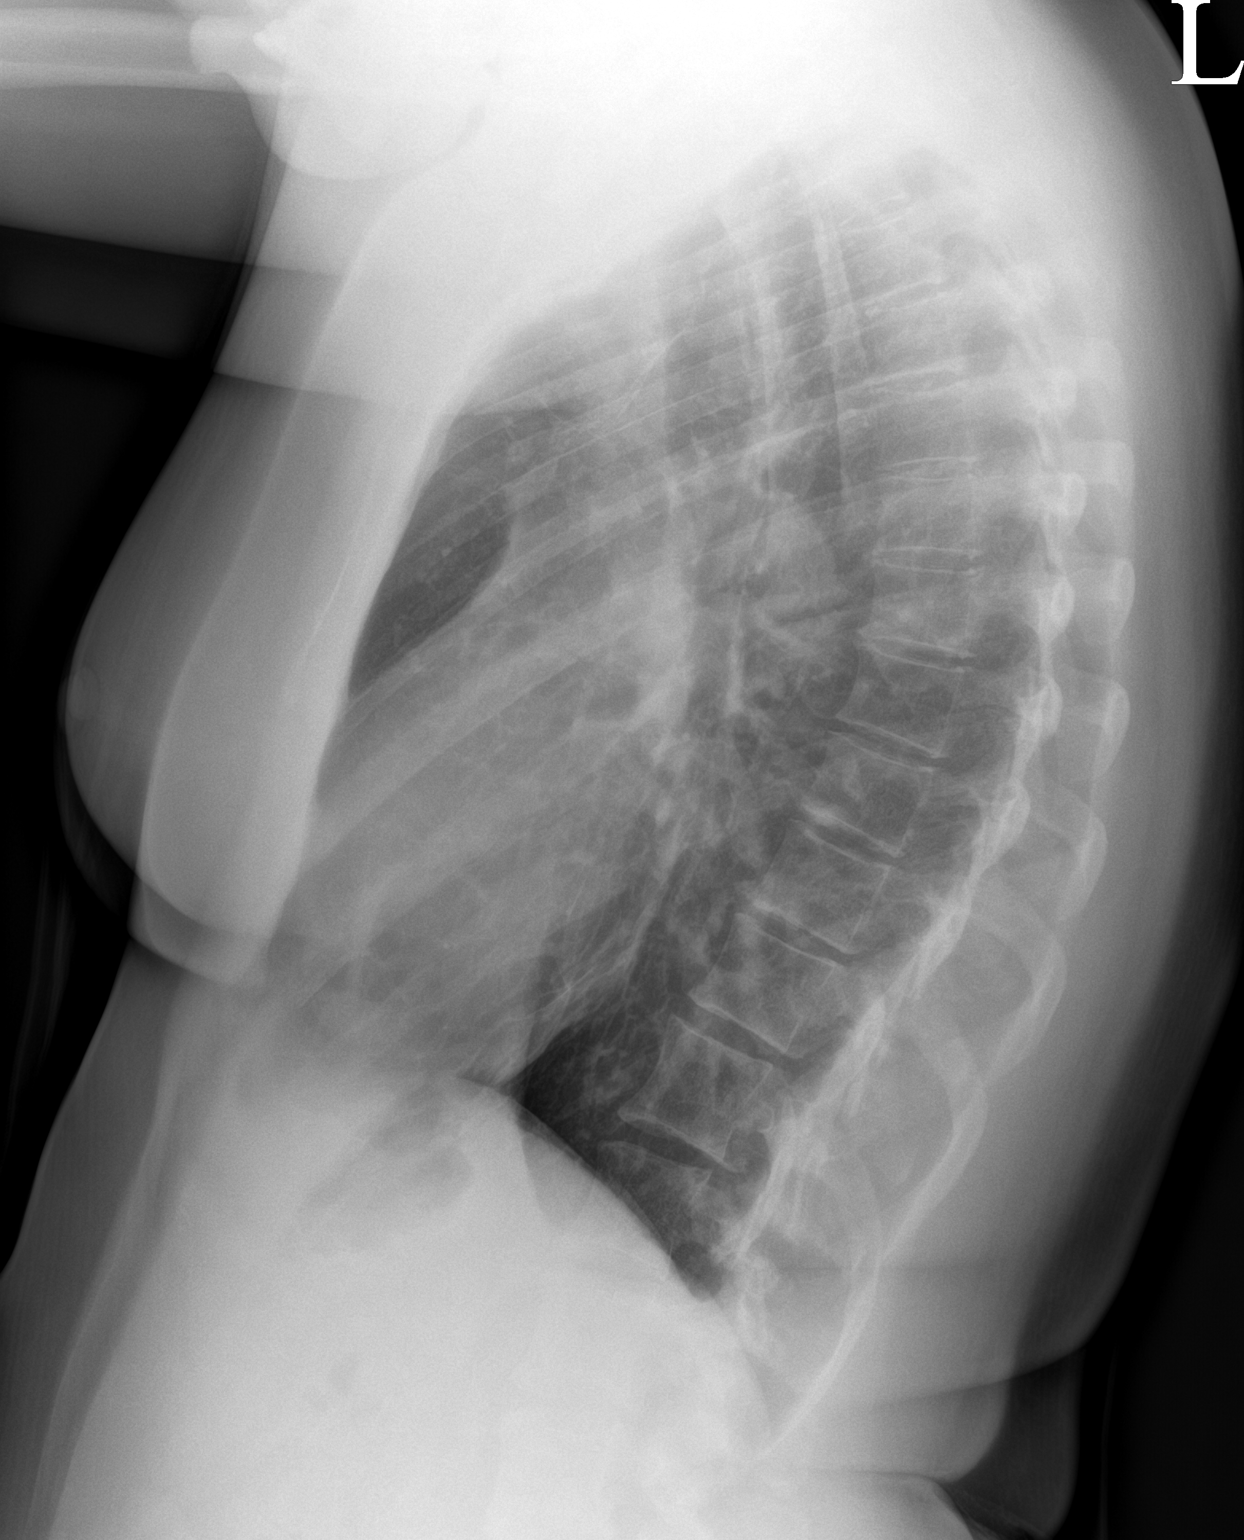

[2 of 2 positions shown; findings below may reference images not displayed]

FINDINGS: Cardiac shadow is within normal limits. Postsurgical changes are
noted in left upper lobe and stable. No focal infiltrate or effusion
is seen. No pneumothorax is noted. No acute bony abnormality is
seen.
IMPRESSION: Postsurgical changes without acute abnormality.

## 2020-10-27 MED ORDER — PREDNISONE 10 MG PO TABS: ORAL_TABLET | ORAL | 0 refills | Status: DC

## 2020-10-27 MED ORDER — TRAZODONE HCL 50 MG PO TABS: 25.0000 mg | ORAL_TABLET | Freq: Every evening | ORAL | 1 refills | Status: DC | PRN

## 2020-10-27 MED ORDER — TRAMADOL HCL 50 MG PO TABS: 50.0000 mg | ORAL_TABLET | Freq: Four times a day (QID) | ORAL | 0 refills | Status: DC | PRN

## 2020-10-27 MED ORDER — GABAPENTIN 100 MG PO CAPS: 100.0000 mg | ORAL_CAPSULE | Freq: Three times a day (TID) | ORAL | 5 refills | Status: DC | PRN

## 2020-10-27 MED ORDER — SODIUM CHLORIDE 3 % IN NEBU: INHALATION_SOLUTION | Freq: Two times a day (BID) | RESPIRATORY_TRACT | 11 refills | Status: DC

## 2020-10-27 NOTE — Patient Instructions (Signed)
Please take all new medication as prescribed - the pain medication, prednisone, gabapentin, and trazodone for sleep a as needed  Please continue all other medications as before, and refills have been done if requested.  Please have the pharmacy call with any other refills you may need.  Please keep your appointments with your specialists as you may have planned  You will be contacted regarding the referral for: MRI for the lower back, and Neurosurgury  Please go to the XRAY Department in the first floor for the x-ray testing  You will be contacted by phone if any changes need to be made immediately.  Otherwise, you will receive a letter about your results with an explanation, but please check with MyChart first.  Please remember to sign up for MyChart if you have not done so, as this will be important to you in the future with finding out test results, communicating by private email, and scheduling acute appointments online when needed.

## 2020-10-27 NOTE — Progress Notes (Signed)
Hannah Barron    [id number]    1970/08/23  Primary Care Physician:John, Hunt Oris, MD  Referring Physician: Biagio Borg, MD [address] [place],  [place] [address]  Chief complaint:  Follow up for  Cystic bronchiectasis  HPI: 50 year old with chronic obstructive lung disease, bronchiectasis. She was previously followed at [place] with a VATS biopsy in 2003 and was told she had cystic bronchiectasis. She had followed up with a pulmonologist at [facility] but has moved to [place] on 2017. We have been unable to get records from [place] in spite of multiple attempts.  She is an immigrant from [place] in 1998 and does not report any exposure to tuberculosis. She does not recall getting a BCG vaccine as a child. Underwent a bronchoscopy to evaluate for MAI infection and a positive quantiferon as she was unable to give a good sputum sample. All results are negative except for positive PJP on DFA. She underwent further evaluation with negative HIV and beta glucan tests.  The PJP test was thought to be false positive She had a hysterectomy for uterine fibroids  On 3/27  Last bronchiectasis exacerbation was in July 2020 which was treated with Z-Pak and prednisone She has separated from her husband in 2021 and reports significant improvement in stress and breathing issues since then  Interim history: Doing well with no issues Breathing is stable She has chronic cough with whitish mucus Required 5 days of prednisone in June for minor exacerbation and is back to normal now  Continues to use the percussion vest every day along with flutter valve  She is back to work now full-time   Outpatient Encounter Medications as of 10/27/2020  Medication Sig   albuterol (VENTOLIN HFA) 108 (90 Base) MCG/ACT inhaler INHALE 2 PUFFS INTO THE LUNGS EVERY 6 HOURS AS NEEDED FOR WHEEZING OR SHORTNESS OF BREATH   atorvastatin (LIPITOR) 20 MG tablet Take 1 tablet (20 mg total) by mouth  daily.   budesonide-formoterol (SYMBICORT) 160-4.5 MCG/ACT inhaler Inhale 2 puffs into the lungs 2 (two) times daily.   clonazePAM (KLONOPIN) 0.5 MG tablet Take 1 tablet (0.5 mg total) by mouth 2 (two) times daily as needed for anxiety.   dicyclomine (BENTYL) 20 MG tablet Take 1 tablet (20 mg total) by mouth every 6 (six) hours.   gabapentin (NEURONTIN) 100 MG capsule Take 1 capsule (100 mg total) by mouth 3 (three) times daily as needed.   metoprolol succinate (TOPROL-XL) 50 MG 24 hr tablet TAKE 1 TABLET IN MORNING & 1/2 TABLET IN EVENING *TAKE 1/2 TAB AS NEEDED FOR PALPITATIONS/RAPID HR*   naproxen (NAPROSYN) 500 MG tablet Take 1 tablet (500 mg total) by mouth 2 (two) times daily with a meal.   omeprazole (PRILOSEC) 40 MG capsule Take 1 capsule (40 mg total) by mouth daily.   prednisoLONE acetate (PRED FORTE) 1 % ophthalmic suspension INSTILL 1 DROP INTO BOTH EYES FOUR TIMES A DAY AS DIRECTED X 1WK THEN TWICE DAILY X 1WK   Respiratory Therapy Supplies (FLUTTER) DEVI Use as directed.   RESTASIS 0.05 % ophthalmic emulsion Instill 1 drop into both eyes two times a day   [DISCONTINUED] cyclobenzaprine (FLEXERIL) 5 MG tablet Take 1 tablet (5 mg total) by mouth 3 (three) times daily as needed for muscle spasms.   No facility-administered encounter medications on file as of 10/27/2020.   Physical Exam: Blood pressure 122/66, pulse 91, temperature 98.2 F (36.8 C), temperature source Oral,  height 5\' 7"  (1.702 m), weight 167 lb 12.8 oz (76.1 kg), last menstrual period 03/31/2016, SpO2 100 %. Gen:      No acute distress HEENT:  EOMI, sclera anicteric Neck:     No masses; no thyromegaly Lungs:    Clear to auscultation bilaterally; normal respiratory effort CV:         Regular rate and rhythm; no murmurs Abd:      + bowel sounds; soft, non-tender; no palpable masses, no distension Ext:    No edema; adequate peripheral perfusion Skin:      Warm and dry; no rash Neuro: alert and oriented x 3 Psych:  normal mood and affect   Data Reviewed: Imaging CT high resolution 07/16/15-areas of cystic and varicose bronchiectasis, extensive air trapping, multiple pulmonary nodules. CT chest 03/21/16-areas of bronchiectasis, air trapping and pulmonary nodules. Unchanged compared to 2017 CT angiogram 12/23/16-stable areas of bronchiectasis, air trapping and pulmonary nodules.  No pulmonary embolism. CT chest 05/14/2020-mild progression of right middle lobe bronchiectasis.  Stable pulmonary nodules I have reviewed the images personally.  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 Discussed INH therapy but defer as per pt wishes CF panel 03/25/15- negative for 97 mutations analyzed (report scanned)  Cardiac Myocardial perfusion study 12/2015 >neg  Ischemia , EF 55% Echo 10/2015 >EF 65%.  Echo 11/2018 > EF 65%, normal valves, no wall motion abnormalities   Bronchoscopy 03/29/16 Cultures, AFB, fungal cultures-negative to date PJP DFA-positive Cytology-no malignant cells, CD4: CD8 ratio-1.62 Cell count WBC-26, 26% lymphs, 50% neutrophils, 24% monocyte macrophage  Assessment:  Cystic bronchiectasis She's had workup which is negative for alpha-1 antitrypsin, immunoglobulin deficiency, autoimmune disease, cystic fibrosis and mycobacterial infections.    Overall she reports she is doing well Continues on Symbicort, albuterol as needed. Mucociliary clearance with percussion vest, flutter valve.  Can use Mucinex over-the-counter as needed CT from earlier this year reviewed with mild progression in the right middle lobe but  clinically she has been stable  Add hypertonic saline nebs twice daily  Plan/Recommendations: - Continue symbicort, albuterol - Continue Flutter valve,  Mucinex, percussion vest - Start hypertonic saline nebs  Marshell Garfinkel MD [facility] 10/27/2020, 9:39 AM   CC: Biagio Borg, MD

## 2020-10-27 NOTE — Addendum Note (Signed)
Addended by: Elton Sin on: 10/27/2020 10:05 AM   Modules accepted: Orders

## 2020-10-27 NOTE — Progress Notes (Signed)
Patient ID: Hannah Barron, female   DOB: 11/27/1970, 50 y.o.   MRN: [id number]        Chief Complaint: follow up left low back and leg pain, chest pain, insomnia       HPI:  Hannah Barron is a 50 y.o. female here with c/o 2 wks onset left low back pain moderate to severe, constant, assoc with LLE pain, nubmness and weakness; no falls, fever or bowel and bladder symptoms.  Pt denies chest pain, increased sob or doe, wheezing, orthopnea, PND, increased LE swelling, palpitations, dizziness or syncope.   Pt denies polydipsia, polyuria.  Does also have left lateral rib pain midl to mod, non exertional or postional, but has been pleuritic, but no skin change or bruise, swelling , cough or fever or trauma.  Also has several months worsening not being able to get to sleep most nights, just cant turn off the brain.  Denies worsening depressive symptoms, suicidal ideation, or panic          Wt Readings from Last 3 Encounters:  10/27/20 168 lb 9.6 oz (76.5 kg)  10/27/20 167 lb 12.8 oz (76.1 kg)  08/03/20 170 lb (77.1 kg)   BP Readings from Last 3 Encounters:  10/27/20 120/68  10/27/20 122/66  08/03/20 118/68         Past Medical History:  Diagnosis Date   Anemia    Bronchiectasis ([facility])    COPD (chronic obstructive pulmonary disease) ([facility])    GERD (gastroesophageal reflux disease)    Migraines    Paroxysmal SVT (supraventricular tachycardia) ([place])    a. diagnosed in 11/2015.   Right leg numbness    Past Surgical History:  Procedure Laterality Date   CESAREAN SECTION     CYSTOSCOPY N/A 04/05/2016   Procedure: CYSTOSCOPY;  Surgeon: Lavonia Drafts, MD;  Location: [facility];  Service: Gynecology;  Laterality: N/A;   LAPAROSCOPIC VAGINAL HYSTERECTOMY WITH SALPINGECTOMY Bilateral 04/05/2016   Procedure: LAPAROSCOPIC ASSISTED VAGINAL HYSTERECTOMY WITH SALPINGECTOMY;  Surgeon: Lavonia Drafts, MD;  Location: [facility];  Service: Gynecology;  Laterality: Bilateral;   LUNG BIOPSY     VIDEO  BRONCHOSCOPY Bilateral 03/29/2016   Procedure: VIDEO BRONCHOSCOPY WITHOUT FLUORO;  Surgeon: Marshell Garfinkel, MD;  [facility];  Service: Cardiopulmonary;  Laterality: Bilateral;    reports that she has never smoked. She has never used smokeless tobacco. She reports that she does not drink alcohol and does not use drugs. family history includes Healthy in her father and mother. No Known Allergies Current Outpatient Medications on File Prior to Visit  Medication Sig Dispense Refill   albuterol (VENTOLIN HFA) 108 (90 Base) MCG/ACT inhaler INHALE 2 PUFFS INTO THE LUNGS EVERY 6 HOURS AS NEEDED FOR WHEEZING OR SHORTNESS OF BREATH 8 each 2   atorvastatin (LIPITOR) 20 MG tablet Take 1 tablet (20 mg total) by mouth daily. 90 tablet 3   budesonide-formoterol (SYMBICORT) 160-4.5 MCG/ACT inhaler Inhale 2 puffs into the lungs 2 (two) times daily. 1 each 5   clonazePAM (KLONOPIN) 0.5 MG tablet Take 1 tablet (0.5 mg total) by mouth 2 (two) times daily as needed for anxiety. 60 tablet 0   dicyclomine (BENTYL) 20 MG tablet Take 1 tablet (20 mg total) by mouth every 6 (six) hours. 60 tablet 5   metoprolol succinate (TOPROL-XL) 50 MG 24 hr tablet TAKE 1 TABLET IN MORNING & 1/2 TABLET IN EVENING *TAKE 1/2 TAB AS NEEDED FOR PALPITATIONS/RAPID HR* 180 tablet 3   naproxen (NAPROSYN) 500 MG tablet Take 1  tablet (500 mg total) by mouth 2 (two) times daily with a meal. 60 tablet 3   omeprazole (PRILOSEC) 40 MG capsule Take 1 capsule (40 mg total) by mouth daily. 90 capsule 3   prednisoLONE acetate (PRED FORTE) 1 % ophthalmic suspension INSTILL 1 DROP INTO BOTH EYES FOUR TIMES A DAY AS DIRECTED X 1WK THEN TWICE DAILY X 1WK     Respiratory Therapy Supplies (FLUTTER) DEVI Use as directed. 1 each 0   RESTASIS 0.05 % ophthalmic emulsion Instill 1 drop into both eyes two times a day  2   sodium chloride HYPERTONIC 3 % nebulizer solution Take by nebulization in the morning and at bedtime. J47.9 750 mL 11   No current  facility-administered medications on file prior to visit.        ROS:  All others reviewed and negative.  Objective        PE:  BP 120/68 (BP Location: Right Arm, Patient Position: Sitting, Cuff Size: Large)   Pulse 93   Ht 5\' 7"  (1.702 m)   Wt 168 lb 9.6 oz (76.5 kg)   LMP 03/31/2016   SpO2 99%   BMI 26.41 kg/m                 Constitutional: Pt appears in NAD               HENT: Head: NCAT.                Right Ear: External ear normal.                 Left Ear: External ear normal.                Eyes: . Pupils are equal, round, and reactive to light. Conjunctivae and EOM are normal               Nose: without d/c or deformity               Neck: Neck supple. Gross normal ROM               Cardiovascular: Normal rate and regular rhythm.                 Pulmonary/Chest: Effort normal and breath sounds without rales or wheezing.                Abd:  Soft, NT, ND, + BS, no organomegaly               Neurological: Pt is alert. At baseline orientation, motor 3+/5 LLE motor weakness and decreased sensation to LT               Skin: Skin is warm. No rashes, no other new lesions, LE edema - none               Psychiatric: Pt behavior is normal without agitation   Micro: none  Cardiac tracings I have personally interpreted today:  none  Pertinent Radiological findings (summarize): none   Lab Results  Component Value Date   WBC 5.9 03/23/2020   HGB 12.7 03/23/2020   HCT 36.9 03/23/2020   PLT 250.0 03/23/2020   GLUCOSE 82 03/23/2020   CHOL 136 03/23/2020   TRIG 54.0 03/23/2020   HDL 63.90 03/23/2020   LDLCALC 61 03/23/2020   ALT 21 03/23/2020   AST 22 03/23/2020   NA 138 03/23/2020   K 3.8 03/23/2020   CL 103 03/23/2020  CREATININE 0.60 03/23/2020   BUN 9 03/23/2020   CO2 28 03/23/2020   TSH 0.70 03/23/2020   INR 1.1 10/27/2014   HGBA1C 5.8 03/23/2020   Assessment/Plan:  Hannah Barron is a 50 y.o. Other or two or more races [6] female with  has a past medical history  of Anemia, Bronchiectasis ([place]), COPD (chronic obstructive pulmonary disease) ([place]), GERD (gastroesophageal reflux disease), Migraines, Paroxysmal SVT (supraventricular tachycardia) ([place]), and Right leg numbness.  Bronchiectasis without acute exacerbation ([facility]) For cxr and rib pain films,  to f/u any worsening symptoms or concerns  Chest pain Suspect msk, for xray as above,  to f/u any worsening symptoms or concerns  Insomnia Mild to mod for trazodone qhs prn,  to f/u any worsening symptoms or concerns  Left lumbar radiculopathy At least mod to severe with neuro change for 2 wks, for tramadol prn, predpac asd, gabapentin, MRI and Neurosurgury referral  Followup: Return if symptoms worsen or fail to improve.  Cathlean Cower, MD 11/01/2020 9:46 PM [facility]

## 2020-10-27 NOTE — Patient Instructions (Signed)
I am glad you are doing well with your breathing Continue Symbicort Continue percussion vest We will order hypertonic saline nebs twice daily and order nebulizer machine for you  Follow-up in 6 months

## 2020-10-28 ENCOUNTER — Encounter: Payer: Self-pay | Admitting: Internal Medicine

## 2020-11-01 ENCOUNTER — Encounter: Payer: Self-pay | Admitting: Internal Medicine

## 2020-11-01 NOTE — Assessment & Plan Note (Signed)
Mild to mod for trazodone qhs prn,  to f/u any worsening symptoms or concerns

## 2020-11-01 NOTE — Assessment & Plan Note (Signed)
At least mod to severe with neuro change for 2 wks, for tramadol prn, predpac asd, gabapentin, MRI and Neurosurgury referral

## 2020-11-01 NOTE — Assessment & Plan Note (Signed)
For cxr and rib pain films,  to f/u any worsening symptoms or concerns

## 2020-11-01 NOTE — Assessment & Plan Note (Signed)
Suspect msk, for xray as above,  to f/u any worsening symptoms or concerns

## 2020-11-04 DIAGNOSIS — M5416 Radiculopathy, lumbar region: Secondary | ICD-10-CM | POA: Diagnosis not present

## 2020-11-04 DIAGNOSIS — Z6826 Body mass index (BMI) 26.0-26.9, adult: Secondary | ICD-10-CM | POA: Diagnosis not present

## 2020-11-22 ENCOUNTER — Ambulatory Visit
Admission: RE | Admit: 2020-11-22 | Discharge: 2020-11-22 | Disposition: A | Discharge status: Home / Self Care | Payer: Medicare HMO | Source: Ambulatory Visit | Attending: Internal Medicine | Admitting: Internal Medicine

## 2020-11-22 ENCOUNTER — Other Ambulatory Visit: Payer: Self-pay

## 2020-11-22 DIAGNOSIS — M545 Low back pain, unspecified: Secondary | ICD-10-CM | POA: Diagnosis not present

## 2020-11-22 DIAGNOSIS — M5416 Radiculopathy, lumbar region: Secondary | ICD-10-CM

## 2020-11-22 IMAGING — MR MR LUMBAR SPINE W/O CM
4 of 5 series · 26 of 48 positions shown · non-contrast
Comparison: None.

CLINICAL DATA: Low back pain radiating to left foot. Ongoing for 2
months.

EXAM:
MRI LUMBAR SPINE WITHOUT CONTRAST
TECHNIQUE: Multiplanar, multisequence MR imaging of the lumbar spine was
performed. No intravenous contrast was administered.

[Series 3: T2 · sagittal · 4.0mm · 1.09mm/px · 6 of 15 slices shown (1 of 2)]
[im 1/15]
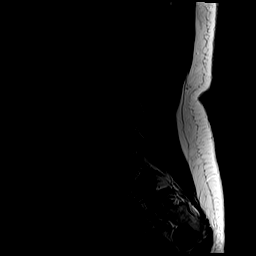
[im 3/15]
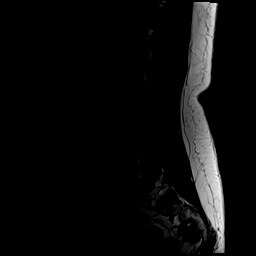
[im 6/15]
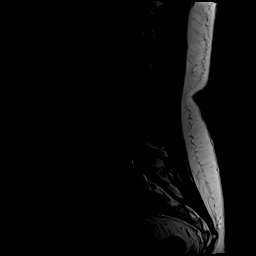
[im 9/15]
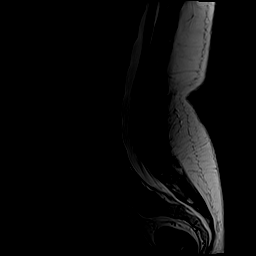
[im 12/15]
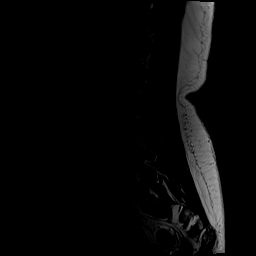
[im 15/15]
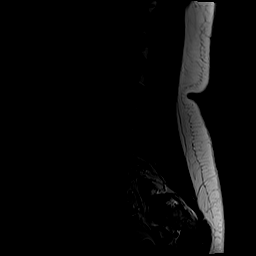

[Series 5: T1 · sagittal · 4.0mm · 1.09mm/px · 5 of 15 slices shown (1 of 2)]
[im 1/15]
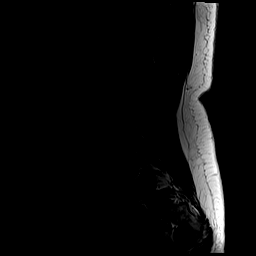
[im 4/15]
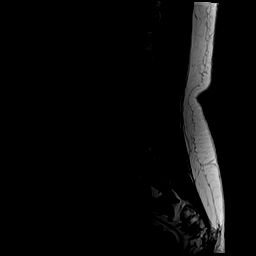
[im 8/15]
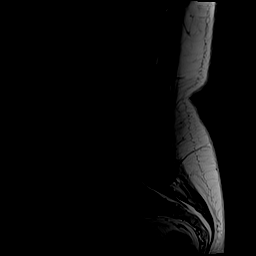
[im 11/15]
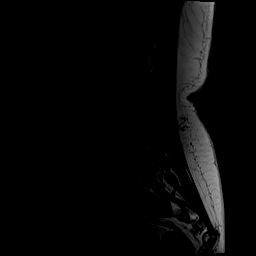
[im 15/15]
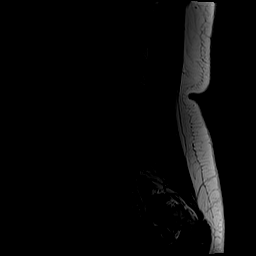

[Series 6: T2 · axial · 4.0mm · 0.39mm/px · z∈[-80,+176]mm · 10 of 44 slices shown (2 of 2)]
[im 3/44]
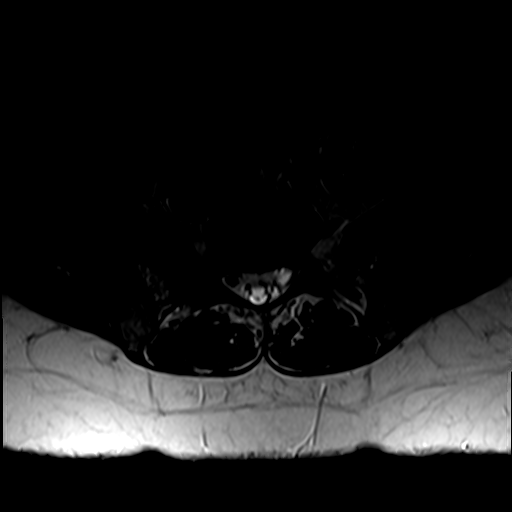
[im 6/44]
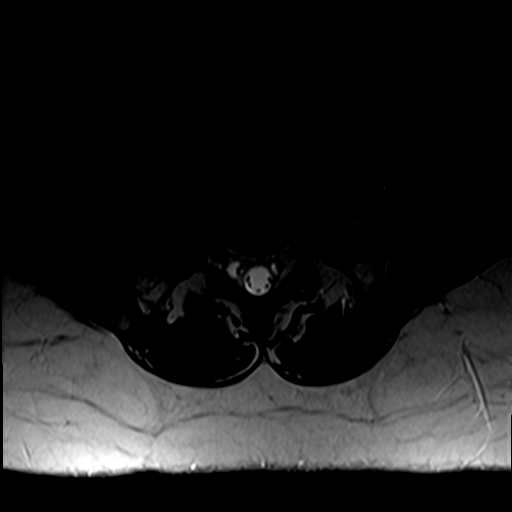
[im 9/44]
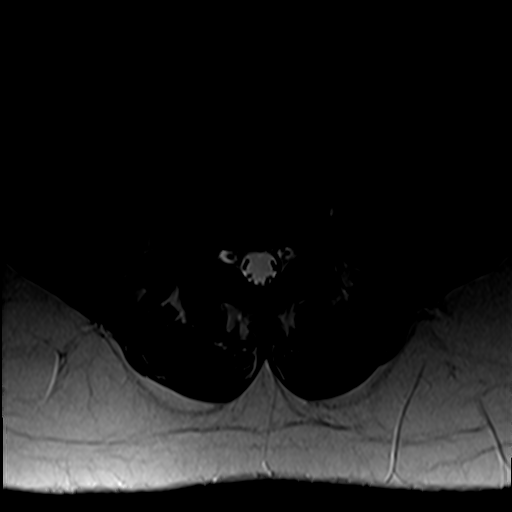
[im 15/44]
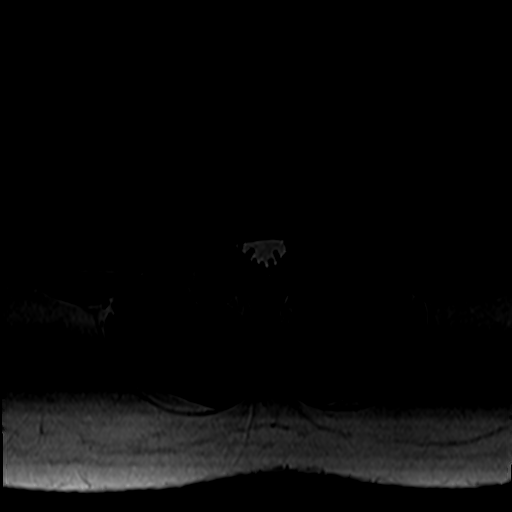
[im 21/44]
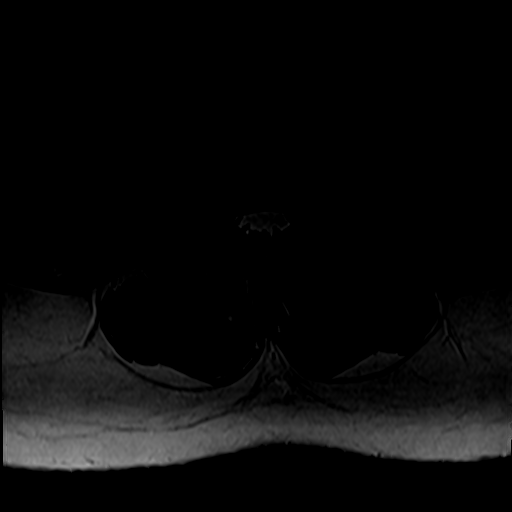
[im 23/44]
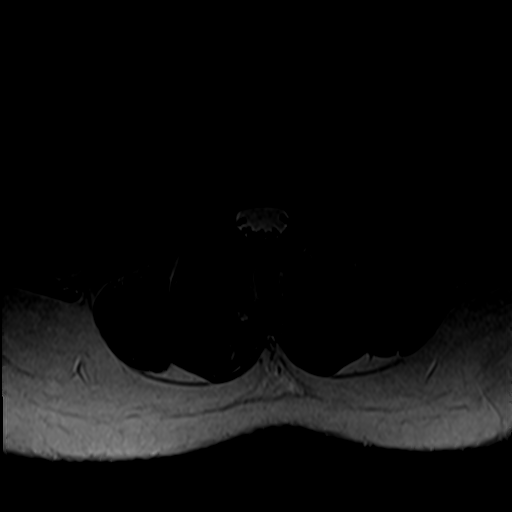
[im 26/44]
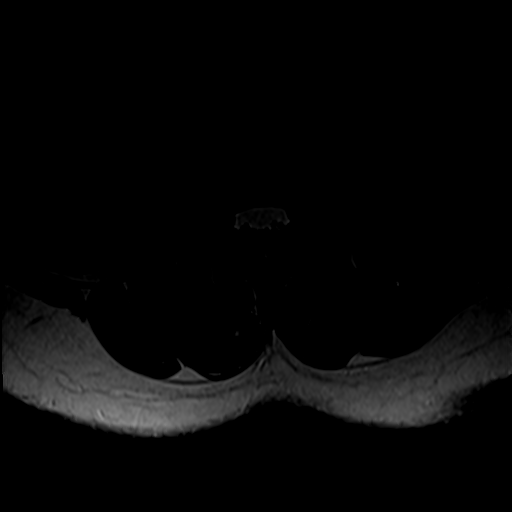
[im 32/44]
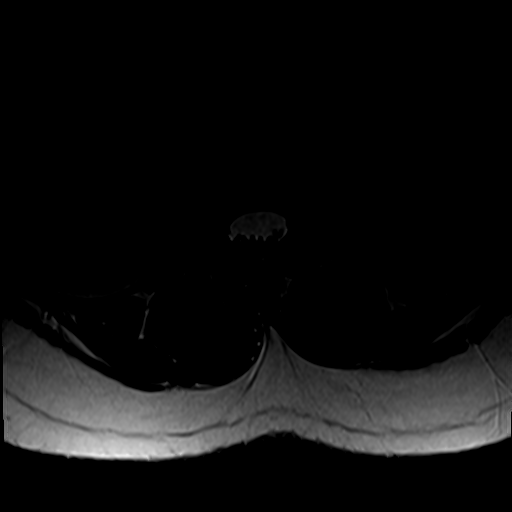
[im 38/44]
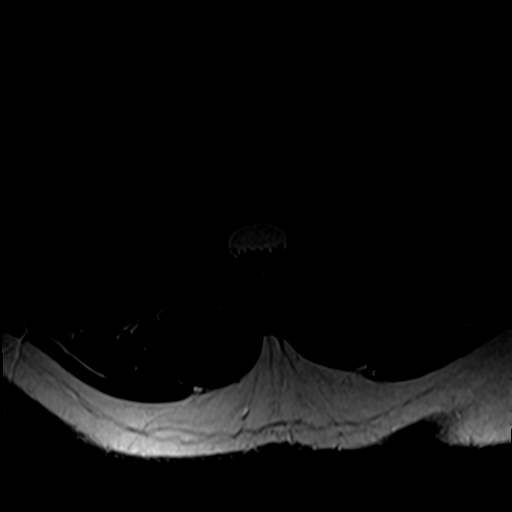
[im 44/44]
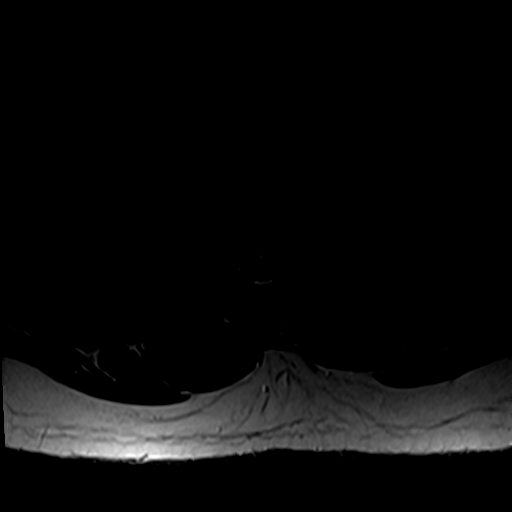

[Series 7: T1 · axial · 4.0mm · 0.39mm/px · z∈[-80,+147]mm · 5 of 44 slices shown (2 of 2)]
[im 3/44]
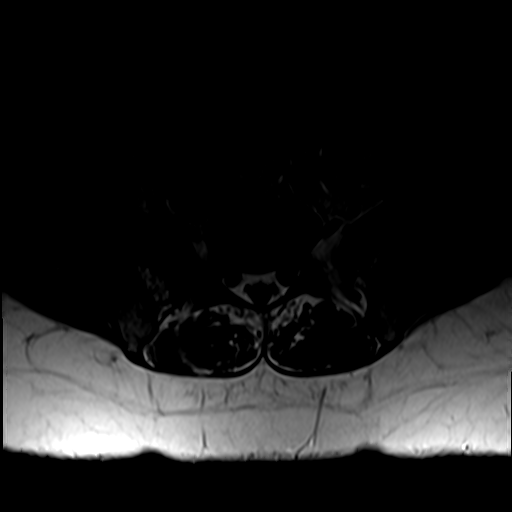
[im 6/44]
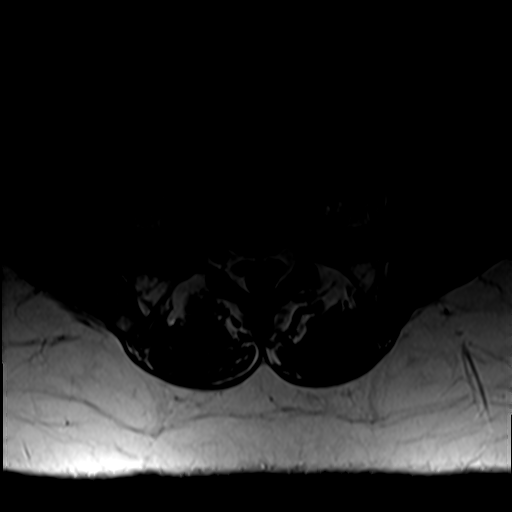
[im 9/44]
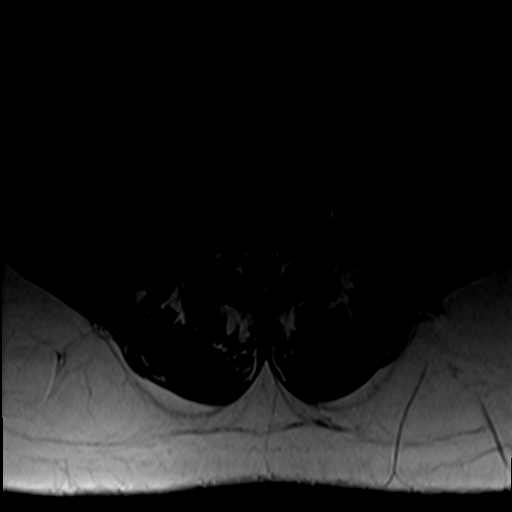
[im 23/44]
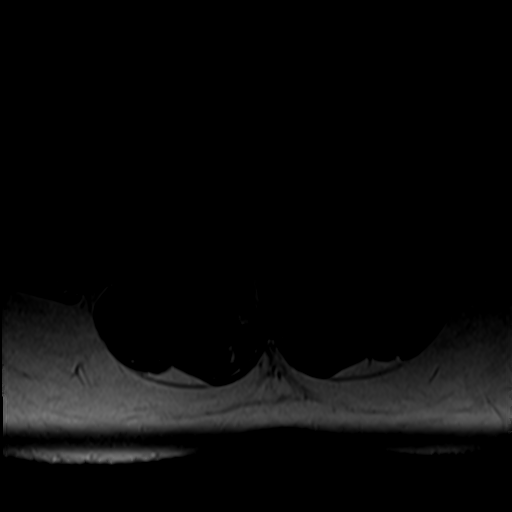
[im 38/44]
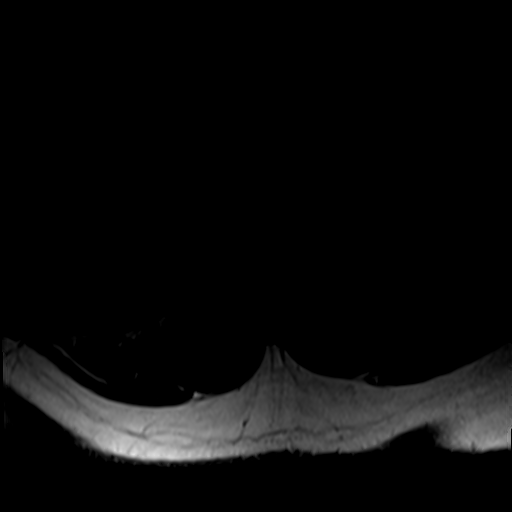

[26 of 48 positions shown; findings below may reference images not displayed]

FINDINGS: Segmentation:  Standard.

Alignment:  Physiologic.

Vertebrae: No acute fracture, evidence of discitis, or aggressive
bone lesion.

Conus medullaris and cauda equina: Conus extends to the T11 level.
Conus and cauda equina appear normal.

Paraspinal and other soft tissues: No acute paraspinal abnormality.

Disc levels:

Disc spaces: Disc spaces are maintained.

T12-L1: No significant disc bulge. No neural foraminal stenosis. No
central canal stenosis.

L1-L2: No significant disc bulge. No neural foraminal stenosis. No
central canal stenosis.

L2-L3: No significant disc bulge. No neural foraminal stenosis. No
central canal stenosis.

L3-L4: No significant disc bulge. No neural foraminal stenosis. No
central canal stenosis.

L4-L5: No significant disc bulge. No neural foraminal stenosis. No
central canal stenosis. Mild bilateral facet arthropathy.

L5-S1: No significant disc bulge. No neural foraminal stenosis. No
central canal stenosis.
IMPRESSION: 1. Mild bilateral facet arthropathy at L4-5.
2.  No acute osseous injury of the lumbar spine.
3. No significant lumbar spine disc protrusion, foraminal stenosis
or central canal stenosis.

## 2020-11-23 ENCOUNTER — Encounter: Payer: Self-pay | Admitting: Internal Medicine

## 2020-11-25 ENCOUNTER — Other Ambulatory Visit: Payer: Self-pay

## 2020-11-25 ENCOUNTER — Ambulatory Visit (INDEPENDENT_AMBULATORY_CARE_PROVIDER_SITE_OTHER): Payer: Medicare HMO | Admitting: Obstetrics & Gynecology

## 2020-11-25 ENCOUNTER — Encounter: Payer: Self-pay | Admitting: Obstetrics & Gynecology

## 2020-11-25 VITALS — BP 106/60 | HR 92 | Wt 169.0 lb

## 2020-11-25 DIAGNOSIS — Z01419 Encounter for gynecological examination (general) (routine) without abnormal findings: Secondary | ICD-10-CM

## 2020-11-25 DIAGNOSIS — N951 Menopausal and female climacteric states: Secondary | ICD-10-CM

## 2020-11-25 DIAGNOSIS — Z1211 Encounter for screening for malignant neoplasm of colon: Secondary | ICD-10-CM | POA: Diagnosis not present

## 2020-11-25 MED ORDER — ESTRADIOL 0.05 MG/24HR TD PTWK: 0.0500 mg | MEDICATED_PATCH | TRANSDERMAL | 12 refills | Status: DC

## 2020-11-25 NOTE — Patient Instructions (Signed)

## 2020-11-25 NOTE — Progress Notes (Signed)
Subjective:     Hannah Barron is a 50 y.o. female here for a routine exam.  Current complaints: pt reports severe hot flushes and insomnia. She was prev on the EES patch and it worked but, she ran out. She and her spouse have divorced (not legally). He has moved back to [place]. She reports that she is content. She works at [employer]. Pt is not currently sexually active.     Gynecologic History Patient's last menstrual period was 03/31/2016. Contraception: status post hysterectomy Last Pap: s/p hysterectomy . Last mammogram: 04/13/2020. Results were: normal  Obstetric History OB History  Gravida Para Term Preterm AB Living  1 1 1     1   SAB IAB Ectopic Multiple Live Births          1    # Outcome Date GA Lbr Len/2nd Weight Sex Delivery Anes PTL Lv  1 Term     F CS-Unspec   LIV   The following portions of the patient's history were reviewed and updated as appropriate: allergies, current medications, past family history, past medical history, past social history, past surgical history, and problem list.  Review of Systems Pertinent items are noted in HPI.    Objective:  BP 106/60   Pulse 92   Wt 169 lb (76.7 kg)   LMP 03/31/2016   BMI 26.47 kg/m   General Appearance:    Alert, cooperative, no distress, appears stated age  Head:    Normocephalic, without obvious abnormality, atraumatic  Eyes:    conjunctiva/corneas clear, EOM's intact, both eyes  Ears:    Normal external ear canals, both ears  Nose:   Nares normal, septum midline, mucosa normal, no drainage    or sinus tenderness  Throat:   Lips, mucosa, and tongue normal; teeth and gums normal  Neck:   Supple, symmetrical, trachea midline, no adenopathy;    thyroid:  no enlargement/tenderness/nodules  Back:     Symmetric, no curvature, ROM normal, no CVA tenderness  Lungs:     respirations unlabored  Chest Wall:    No tenderness or deformity   Heart:    Regular rate and rhythm  Breast Exam:    No tenderness, masses, or nipple  abnormality  Abdomen:     Soft, non-tender, bowel sounds active all four quadrants,    no masses, no organomegaly  Genitalia:    Normal female without lesion, discharge or tenderness   Uterus surgically absent  Extremities:   Extremities normal, atraumatic, no cyanosis or edema  Pulses:   2+ and symmetric all extremities  Skin:   Skin color, texture, turgor normal, no rashes or lesions     Assessment:    Healthy female exam.  Sx menopausal state. Prev on EES with good relief of sx per pt. Would like to restart patch.    Plan:   Duyen was seen today for annual exam.  Diagnoses and all orders for this visit:  Colon cancer screening -     Ambulatory referral to Gastroenterology  Menopausal syndrome (hot flushes) -     estradiol (CLIMARA) 0.05 mg/24hr patch; Place 1 patch (0.05 mg total) onto the skin once a week.  Menopausal symptoms -     estradiol (CLIMARA) 0.05 mg/24hr patch; Place 1 patch (0.05 mg total) onto the skin once a week.   F/u in 3 months if sx not improved with the patch.  O/w f/u in 1 year for annual   Binyamin Nelis L. Harraway-Smith, M.D., Cherlynn June

## 2021-01-05 ENCOUNTER — Ambulatory Visit: Payer: Medicare HMO | Admitting: Gastroenterology

## 2021-01-05 ENCOUNTER — Encounter: Payer: Self-pay | Admitting: Gastroenterology

## 2021-01-05 VITALS — BP 122/58 | HR 95 | Ht 67.0 in | Wt 168.2 lb

## 2021-01-05 DIAGNOSIS — Z1211 Encounter for screening for malignant neoplasm of colon: Secondary | ICD-10-CM | POA: Diagnosis not present

## 2021-01-05 DIAGNOSIS — R0789 Other chest pain: Secondary | ICD-10-CM | POA: Diagnosis not present

## 2021-01-05 DIAGNOSIS — R1013 Epigastric pain: Secondary | ICD-10-CM

## 2021-01-05 NOTE — Patient Instructions (Signed)
You have been scheduled for an endoscopy and colonoscopy. Please follow the written instructions given to you at your visit today. Please pick up your prep supplies at the pharmacy within the next 1-3 days. If you use inhalers (even only as needed), please bring them with you on the day of your procedure.  HOLD OMEPRAZOLE 5 DAYS PRIOR TO YOUR PROCEDURE.

## 2021-01-05 NOTE — Progress Notes (Signed)
Hannah Barron GI Progress Note  Chief Complaint: Epigastric and chest pain  Subjective  History: Hannah Barron was seen in 2019 for longstanding functional dyspepsia with bloating and early satiety, previous work-up in [place].  She has had variable relief from dicyclomine.  Trial of rifaximin ordered in 2019, insurance would not cover, so treated with metronidazole and cephalexin.  She was last seen September 2021 for acute musculoskeletal chest wall pain after an ED visit. Primary care visit in July for grief reaction after death of her father, also reporting epigastric pain.  Seen by gynecology November 16, perimenopausal symptoms treated and referred for colon cancer screening.  Hannah Barron as similar symptoms to before with upper abdominal bloating and early satiety, but also over this last 6 to 9 months has had epigastric and lower sternal discomfort most prominent when sitting for prolonged periods and often with a burning quality.  She also notices it after eating sometimes.  Denies dysphagia as near as I can tell, though says she drinks plenty of water "to make sure food passes".  She denies vomiting or weight loss.  She is also still struggling with the sudden loss of her father over the summer, and was tearful during the visit discussing it. Bowel habits regular without rectal bleeding, no known family history of colorectal cancer, though knows little of family's medical history. ROS: Cardiovascular:  no chest pain Respiratory: no dyspnea Intermittent depression and anxiety Remainder systems negative except as above The patient's Past Medical, Family and Social History were reviewed and are on file in the EMR.  Objective:  Med list reviewed  Current Outpatient Medications:    albuterol (VENTOLIN HFA) 108 (90 Base) MCG/ACT inhaler, INHALE 2 PUFFS INTO THE LUNGS EVERY 6 HOURS AS NEEDED FOR WHEEZING OR SHORTNESS OF BREATH, Disp: 8 each, Rfl: 2   atorvastatin (LIPITOR) 20 MG tablet, Take  1 tablet (20 mg total) by mouth daily., Disp: 90 tablet, Rfl: 3   budesonide-formoterol (SYMBICORT) 160-4.5 MCG/ACT inhaler, Inhale 2 puffs into the lungs 2 (two) times daily., Disp: 1 each, Rfl: 5   clonazePAM (KLONOPIN) 0.5 MG tablet, Take 1 tablet (0.5 mg total) by mouth 2 (two) times daily as needed for anxiety., Disp: 60 tablet, Rfl: 0   dicyclomine (BENTYL) 20 MG tablet, Take 1 tablet (20 mg total) by mouth every 6 (six) hours., Disp: 60 tablet, Rfl: 5   estradiol (CLIMARA) 0.05 mg/24hr patch, Place 1 patch (0.05 mg total) onto the skin once a week., Disp: 4 patch, Rfl: 12   gabapentin (NEURONTIN) 100 MG capsule, Take 1 capsule (100 mg total) by mouth 3 (three) times daily as needed., Disp: 90 capsule, Rfl: 5   metoprolol succinate (TOPROL-XL) 50 MG 24 hr tablet, TAKE 1 TABLET IN MORNING & 1/2 TABLET IN EVENING *TAKE 1/2 TAB AS NEEDED FOR PALPITATIONS/RAPID HR*, Disp: 180 tablet, Rfl: 3   naproxen (NAPROSYN) 500 MG tablet, Take 1 tablet (500 mg total) by mouth 2 (two) times daily with a meal., Disp: 60 tablet, Rfl: 3   omeprazole (PRILOSEC) 40 MG capsule, Take 1 capsule (40 mg total) by mouth daily., Disp: 90 capsule, Rfl: 3   prednisoLONE acetate (PRED FORTE) 1 % ophthalmic suspension, INSTILL 1 DROP INTO BOTH EYES FOUR TIMES A DAY AS DIRECTED X 1WK THEN TWICE DAILY X 1WK, Disp: , Rfl:    predniSONE (DELTASONE) 10 MG tablet, 3 tabs by mouth per day for 3 days,2tabs per day for 3 days,1tab per day for 3 days, Disp:  18 tablet, Rfl: 0   Respiratory Therapy Supplies (FLUTTER) DEVI, Use as directed., Disp: 1 each, Rfl: 0   RESTASIS 0.05 % ophthalmic emulsion, Instill 1 drop into both eyes two times a day, Disp: , Rfl: 2   sodium chloride HYPERTONIC 3 % nebulizer solution, Take by nebulization in the morning and at bedtime. J47.9, Disp: 750 mL, Rfl: 11   traMADol (ULTRAM) 50 MG tablet, Take 1 tablet (50 mg total) by mouth every 6 (six) hours as needed., Disp: 30 tablet, Rfl: 0   traZODone (DESYREL)  50 MG tablet, Take 0.5-1 tablets (25-50 mg total) by mouth at bedtime as needed for sleep., Disp: 90 tablet, Rfl: 1   Vital signs in last 24 hrs: Vitals:   01/05/21 1032  BP: (!) 122/58  Pulse: 95   Wt Readings from Last 3 Encounters:  01/05/21 168 lb 4 oz (76.3 kg)  11/25/20 169 lb (76.7 kg)  10/27/20 168 lb 9.6 oz (76.5 kg)    Physical Exam  Well-appearing, pleasant and conversational as always HEENT: sclera anicteric, oral mucosa moist without lesions Neck: supple, no thyromegaly, JVD or lymphadenopathy Cardiac: RRR without murmurs, S1S2 heard, no peripheral edema Pulm: clear to auscultation bilaterally, normal RR and effort noted Abdomen: soft, lower sternal and epigastric tenderness, with active bowel sounds. No guarding or palpable hepatosplenomegaly. Skin; warm and dry, no jaundice or rash  Labs:   ___________________________________________ Radiologic studies:   ____________________________________________ Other:   _____________________________________________ Assessment & Plan  Assessment: Encounter Diagnoses  Name Primary?   Epigastric pain Yes   Other chest pain    Special screening for malignant neoplasms, colon    While this pain has a burning quality and sometimes occurs after meals, most of this favors a musculoskeletal abdominal/chest wall because of pain.  Inconsistent improvement on PPI.  She has had musculoskeletal chest pain in the past.  Also history of nonulcer dyspepsia.  Average risk colorectal cancer  I offered her the following:  Upper endoscopy with placement of a wireless pH device while off PPI 5 days.  If she has no more than physiologic amount of reflux,, then GERD would not explain her pain and I think she could stop PPI and focus on treatment for musculoskeletal pain.  She reports often trying a heating pad or other local remedies, perhaps some Voltaren gel, lidocaine patch, heat patch or other such therapies would be helpful.  Will  defer to primary care on this.  Screening colonoscopy.   She was agreeable to both procedures after discussion of risks and benefits.  The benefits and risks of the planned procedure were described in detail with the patient or (when appropriate) their health care proxy.  Risks were outlined as including, but not limited to, bleeding, infection, perforation, adverse medication reaction leading to cardiac or pulmonary decompensation, pancreatitis (if ERCP).  The limitation of incomplete mucosal visualization was also discussed.  No guarantees or warranties were given.   33 minutes were spent on this encounter (including chart review, history/exam, counseling/coordination of care, and documentation) > 50% of that time was spent on counseling and coordination of care.   Nelida Meuse III

## 2021-01-07 ENCOUNTER — Telehealth: Payer: Self-pay | Admitting: Gastroenterology

## 2021-01-07 MED ORDER — PLENVU 140 G PO SOLR: 1.0000 | ORAL | 0 refills | Status: DC

## 2021-01-07 NOTE — Telephone Encounter (Signed)
Patient went by the [facility] on [facility] to pick up her prep for the colonoscopy scheduled for 02/19/21 and said the prescription was not there.  Please re-send to the pharmacy.  If there is an issue, please reach out to patient.  Thank you.

## 2021-01-07 NOTE — Telephone Encounter (Signed)
Plenvu has been sent in to the [facility].

## 2021-02-08 ENCOUNTER — Telehealth: Payer: Self-pay | Admitting: Pulmonary Disease

## 2021-02-09 MED ORDER — AZITHROMYCIN 250 MG PO TABS: ORAL_TABLET | ORAL | 0 refills | Status: AC

## 2021-02-09 MED ORDER — PREDNISONE 20 MG PO TABS: 40.0000 mg | ORAL_TABLET | Freq: Every day | ORAL | 0 refills | Status: DC

## 2021-02-09 NOTE — Telephone Encounter (Signed)
Patient is aware of recommendations and voiced her understanding. Zpak and prednisone sent to preferred pharmacy.  Nothing further needed.

## 2021-02-09 NOTE — Telephone Encounter (Signed)
Please send in order for Z pack and prednisone 40mg /day for 5 days

## 2021-02-09 NOTE — Telephone Encounter (Signed)
Primary Pulmonologist: Mannam  Last office visit and with whom: 10/27/20 What do we see them for (pulmonary problems):  Bronchiectasis without acute exacerbation ([place]) Last OV assessment/plan: Assessment:  Cystic bronchiectasis She's had workup which is negative for alpha-1 antitrypsin, immunoglobulin deficiency, autoimmune disease, cystic fibrosis and mycobacterial infections.     Overall she reports she is doing well Continues on Symbicort, albuterol as needed. Mucociliary clearance with percussion vest, flutter valve.  Can use Mucinex over-the-counter as needed CT from earlier this year reviewed with mild progression in the right middle lobe but clinically she has been stable  Add hypertonic saline nebs twice daily   Plan/Recommendations: - Continue symbicort, albuterol - Continue Flutter valve,  Mucinex, percussion vest - Start hypertonic saline nebs     Reason for call: Pt states she has had a productive cough since Saturday. Pt stated she did have wheezing 2 days ago but it has gotten better. Pt denies f/c or GI upset. Pt states using PRN albuterol inhaler which has helped some but she is still coughing. Pt is using Symbicort as directed. Dr. Vaughan Browner please advise.   (examples of things to ask: : When did symptoms start? Fever? Cough? Productive? Color to sputum? More sputum than usual? Wheezing? Have you needed increased oxygen? Are you taking your respiratory medications? What over the counter measures have you tried?)  No Known Allergies  Immunization History  Administered Date(s) Administered   Influenza,inj,Quad PF,6+ Mos 11/07/2015, 10/26/2016, 09/20/2017, 09/03/2018, 10/08/2019, 10/27/2020   PFIZER(Purple Top)SARS-COV-2 Vaccination 03/11/2019, 04/01/2019, 01/18/2020   Pneumococcal Polysaccharide-23 11/07/2015

## 2021-02-19 ENCOUNTER — Ambulatory Visit (AMBULATORY_SURGERY_CENTER): Payer: Medicare HMO | Admitting: Gastroenterology

## 2021-02-19 ENCOUNTER — Encounter: Payer: Self-pay | Admitting: Gastroenterology

## 2021-02-19 ENCOUNTER — Ambulatory Visit: Payer: Medicare HMO | Admitting: Gastroenterology

## 2021-02-19 ENCOUNTER — Encounter: Payer: Medicare HMO | Admitting: Gastroenterology

## 2021-02-19 VITALS — BP 118/76 | HR 71 | Temp 96.8°F | Resp 12 | Ht 67.0 in | Wt 168.0 lb

## 2021-02-19 DIAGNOSIS — Z1211 Encounter for screening for malignant neoplasm of colon: Secondary | ICD-10-CM | POA: Diagnosis not present

## 2021-02-19 DIAGNOSIS — R0789 Other chest pain: Secondary | ICD-10-CM | POA: Diagnosis not present

## 2021-02-19 DIAGNOSIS — K635 Polyp of colon: Secondary | ICD-10-CM | POA: Diagnosis not present

## 2021-02-19 DIAGNOSIS — D122 Benign neoplasm of ascending colon: Secondary | ICD-10-CM | POA: Diagnosis not present

## 2021-02-19 DIAGNOSIS — K219 Gastro-esophageal reflux disease without esophagitis: Secondary | ICD-10-CM

## 2021-02-19 DIAGNOSIS — R1013 Epigastric pain: Secondary | ICD-10-CM

## 2021-02-19 MED ORDER — SODIUM CHLORIDE 0.9 % IV SOLN: 500.0000 mL | Freq: Once | INTRAVENOUS | Status: DC

## 2021-02-19 NOTE — Progress Notes (Signed)
History and Physical:  This patient presents for endoscopic testing for: Encounter Diagnoses  Name Primary?   Epigastric pain Yes   Special screening for malignant neoplasms, colon    Other chest pain    Clinical details in 01/05/21 office note. Similar symptoms continue  ROS: Patient denies chest pain or shortness of breath   Past Medical History: Past Medical History:  Diagnosis Date   Anemia    Bronchiectasis (HCC)    COPD (chronic obstructive pulmonary disease) (HCC)    GERD (gastroesophageal reflux disease)    Hyperlipidemia    Migraines    Paroxysmal SVT (supraventricular tachycardia) ([place])    a. diagnosed in 11/2015.   Right leg numbness      Past Surgical History: Past Surgical History:  Procedure Laterality Date   CESAREAN SECTION     CYSTOSCOPY N/A 04/05/2016   Procedure: CYSTOSCOPY;  Surgeon: Lavonia Drafts, MD;  Location: [facility];  Service: Gynecology;  Laterality: N/A;   LAPAROSCOPIC VAGINAL HYSTERECTOMY WITH SALPINGECTOMY Bilateral 04/05/2016   Procedure: LAPAROSCOPIC ASSISTED VAGINAL HYSTERECTOMY WITH SALPINGECTOMY;  Surgeon: Lavonia Drafts, MD;  Location: [facility];  Service: Gynecology;  Laterality: Bilateral;   LUNG BIOPSY     VIDEO BRONCHOSCOPY Bilateral 03/29/2016   Procedure: VIDEO BRONCHOSCOPY WITHOUT FLUORO;  Surgeon: Marshell Garfinkel, MD;  Location: [facility];  Service: Cardiopulmonary;  Laterality: Bilateral;    Allergies: No Known Allergies  Outpatient Meds: Current Outpatient Medications  Medication Sig Dispense Refill   albuterol (VENTOLIN HFA) 108 (90 Base) MCG/ACT inhaler INHALE 2 PUFFS INTO THE LUNGS EVERY 6 HOURS AS NEEDED FOR WHEEZING OR SHORTNESS OF BREATH 8 each 2   atorvastatin (LIPITOR) 20 MG tablet Take 1 tablet (20 mg total) by mouth daily. 90 tablet 3   budesonide-formoterol (SYMBICORT) 160-4.5 MCG/ACT inhaler Inhale 2 puffs into the lungs 2 (two) times daily. 1 each 5   clonazePAM (KLONOPIN) 0.5 MG tablet Take 1  tablet (0.5 mg total) by mouth 2 (two) times daily as needed for anxiety. 60 tablet 0   dicyclomine (BENTYL) 20 MG tablet Take 1 tablet (20 mg total) by mouth every 6 (six) hours. 60 tablet 5   estradiol (CLIMARA) 0.05 mg/24hr patch Place 1 patch (0.05 mg total) onto the skin once a week. 4 patch 12   omeprazole (PRILOSEC) 40 MG capsule Take 1 capsule (40 mg total) by mouth daily. 90 capsule 3   Respiratory Therapy Supplies (FLUTTER) DEVI Use as directed. 1 each 0   gabapentin (NEURONTIN) 100 MG capsule Take 1 capsule (100 mg total) by mouth 3 (three) times daily as needed. (Patient not taking: Reported on 02/19/2021) 90 capsule 5   metoprolol succinate (TOPROL-XL) 50 MG 24 hr tablet TAKE 1 TABLET IN MORNING & 1/2 TABLET IN EVENING *TAKE 1/2 TAB AS NEEDED FOR PALPITATIONS/RAPID HR* (Patient not taking: Reported on 02/19/2021) 180 tablet 3   naproxen (NAPROSYN) 500 MG tablet Take 1 tablet (500 mg total) by mouth 2 (two) times daily with a meal. (Patient not taking: Reported on 02/19/2021) 60 tablet 3   prednisoLONE acetate (PRED FORTE) 1 % ophthalmic suspension INSTILL 1 DROP INTO BOTH EYES FOUR TIMES A DAY AS DIRECTED X 1WK THEN TWICE DAILY X 1WK (Patient not taking: Reported on 02/19/2021)     predniSONE (DELTASONE) 10 MG tablet 3 tabs by mouth per day for 3 days,2tabs per day for 3 days,1tab per day for 3 days (Patient not taking: Reported on 02/19/2021) 18 tablet 0   predniSONE (DELTASONE) 20 MG tablet Take  2 tablets (40 mg total) by mouth daily with breakfast. (Patient not taking: Reported on 02/19/2021) 10 tablet 0   RESTASIS 0.05 % ophthalmic emulsion Instill 1 drop into both eyes two times a day (Patient not taking: Reported on 02/19/2021)  2   sodium chloride HYPERTONIC 3 % nebulizer solution Take by nebulization in the morning and at bedtime. J47.9 (Patient not taking: Reported on 02/19/2021) 750 mL 11   traMADol (ULTRAM) 50 MG tablet Take 1 tablet (50 mg total) by mouth every 6 (six) hours as needed.  (Patient not taking: Reported on 02/19/2021) 30 tablet 0   traZODone (DESYREL) 50 MG tablet Take 0.5-1 tablets (25-50 mg total) by mouth at bedtime as needed for sleep. (Patient not taking: Reported on 02/19/2021) 90 tablet 1   Current Facility-Administered Medications  Medication Dose Route Frequency Provider Last Rate Last Admin   0.9 %  sodium chloride infusion  500 mL Intravenous Once Doran Stabler, MD          ___________________________________________________________________ Objective   Exam:  BP 112/70    Pulse 97    Temp (!) 96.8 F (36 C)    Ht 5\' 7"  (1.702 m)    Wt 168 lb (76.2 kg)    LMP 03/31/2016    SpO2 96%    BMI 26.31 kg/m   CV: RRR without murmur, S1/S2 Resp: clear to auscultation bilaterally, normal RR and effort noted GI: soft, no tenderness, with active bowel sounds.   Assessment: Encounter Diagnoses  Name Primary?   Epigastric pain Yes   Special screening for malignant neoplasms, colon    Other chest pain      Plan: Colonoscopy EGD with Bravo pH device placement  The benefits and risks of the planned procedure were described in detail with the patient or (when appropriate) their health care proxy.  Risks were outlined as including, but not limited to, bleeding, infection, perforation, adverse medication reaction leading to cardiac or pulmonary decompensation, pancreatitis (if ERCP).  The limitation of incomplete mucosal visualization was also discussed.  No guarantees or warranties were given.    The patient is appropriate for an endoscopic procedure in the ambulatory setting.   - Wilfrid Lund, MD

## 2021-02-19 NOTE — Patient Instructions (Signed)
Discharge instructions given. Handouts on polyps,Hemorrhoids and Bravo instructions. Resume previous medications. Post-op Bravo pH instructions Once you get home:  Eat normally and go about your daily routine/activities Limit drinking fluids or eating between meals Do not chew gum or eat hard candy DO NOT take any antacid or anti-reflux medications during the 48-hour monitoring time, unless instructed by your physician  Recording events: Events to be recorded are:  Record using event buttons on recorder and write on paper diary form Every time you eat or drink something (other than water) 2.   Periods of lying down/reclining 3.  Symptoms:  may include heartburn, regurgitation, chest pain, cough or specify if other.  A paper diary is also provided to record the times of your reflux symptoms and times for meals and when you lie down.  The recorder needs to remain within 3 feet (arms length) of you during the testing period (48 hours). If you should forget and move outside of a 3-foot radius of the receiver you may hear beeping and you will see a C1 error in the display window on the top of the receiver.  Please pick up the receiver and hold close to you to re-establish the connection and the error message disappears.  You may take a bath/shower during the testing period, but the recorder must not get wet and must remain within 3 feet of you. Please leave the receiver outside of the shower or tub while bathing. The monitoring period will be for 48 hours after placement of the capsule.  At the end of the 48 hours, you will return the recorder, and your diary, to our [facility] front desk.  A nurse will meet you to collect the device and answer any questions you may have.  The device should turn off once the 48 hours is complete.   What to expect after placement of the capsule:  Some patients experience a vague sensation that something is in their esophagus or that they  feel the capsule when they swallow food.  Should you experience this, chewing food carefully or drinking liquids may minimize this sensation.   After the test is complete, the disposable capsule will fall off the wall of your esophagus within 5-10 days and pass naturally with your bowel movement through the digestive tract.  Once the recorder is returned, your provider will review and interpret your recordings and contact you to discuss your results.  This may take up to two weeks.   DO NOT have an MRI for 30 days after your procedure to ensure the capsule is no longer inside your body  It is imperative that you return the recorder on _________________________ by 3:00pm.  Your information must be downloaded at this time to obtain your results.     YOU HAD AN ENDOSCOPIC PROCEDURE TODAY AT [facility]:   Refer to the procedure report that was given to you for any specific questions about what was found during the examination.  If the procedure report does not answer your questions, please call your gastroenterologist to clarify.  If you requested that your care partner not be given the details of your procedure findings, then the procedure report has been included in a sealed envelope for you to review at your convenience later.  YOU SHOULD EXPECT: Some feelings of bloating in the abdomen. Passage of more gas than usual.  Walking can help get rid of the air that was put into your GI tract during the  procedure and reduce the bloating. If you had a lower endoscopy (such as a colonoscopy or flexible sigmoidoscopy) you may notice spotting of blood in your stool or on the toilet paper. If you underwent a bowel prep for your procedure, you may not have a normal bowel movement for a few days.  Please Note:  You might notice some irritation and congestion in your nose or some drainage.  This is from the oxygen used during your procedure.  There is no need for concern and it should clear up in a  day or so.  SYMPTOMS TO REPORT IMMEDIATELY:  Following lower endoscopy (colonoscopy or flexible sigmoidoscopy):  Excessive amounts of blood in the stool  Significant tenderness or worsening of abdominal pains  Swelling of the abdomen that is new, acute  Fever of 100F or higher  Following upper endoscopy (EGD)  Vomiting of blood or coffee ground material  New chest pain or pain under the shoulder blades  Painful or persistently difficult swallowing  New shortness of breath  Fever of 100F or higher  Black, tarry-looking stools  For urgent or emergent issues, a gastroenterologist can be reached at any hour by calling [phone]. Do not use MyChart messaging for urgent concerns.    DIET:  We do recommend a small meal at first, but then you may proceed to your regular diet.  Drink plenty of fluids but you should avoid alcoholic beverages for 24 hours.  ACTIVITY:  You should plan to take it easy for the rest of today and you should NOT DRIVE or use heavy machinery until tomorrow (because of the sedation medicines used during the test).    FOLLOW UP: Our staff will call the number listed on your records 48-72 hours following your procedure to check on you and address any questions or concerns that you may have regarding the information given to you following your procedure. If we do not reach you, we will leave a message.  We will attempt to reach you two times.  During this call, we will ask if you have developed any symptoms of COVID 19. If you develop any symptoms (ie: fever, flu-like symptoms, shortness of breath, cough etc.) before then, please call [phone].  If you test positive for Covid 19 in the 2 weeks post procedure, please call and report this information to [place].    If any biopsies were taken you will be contacted by phone or by letter within the next 1-3 weeks.  Please call us at [phone] if you have not heard about the biopsies in 3 weeks.     SIGNATURES/CONFIDENTIALITY: You and/or your care partner have signed paperwork which will be entered into your electronic medical record.  These signatures attest to the fact that that the information above on your After Visit Summary has been reviewed and is understood.  Full responsibility of the confidentiality of this discharge information lies with you and/or your care-partner.

## 2021-02-19 NOTE — Progress Notes (Signed)
Called to room to assist during endoscopic procedure.  Patient ID and intended procedure confirmed with present staff. Received instructions for my participation in the procedure from the performing physician.   Capsule expiration date- 07/05/21  Capsule ID number [id number]  LES measurement: 37cm (capsule placed 6 cm above LES) 31cm  Time of implant: 1120 am

## 2021-02-19 NOTE — Progress Notes (Signed)
Pt non-responsive, VVS, Report to RN  °

## 2021-02-19 NOTE — Op Note (Signed)
[facility] Patient Name: Hannah Barron Procedure Date: 02/19/2021 10:18 AM MRN: [id number] Endoscopist: [place]. Loletha Carrow , MD Age: 51 Referring MD:  Date of Birth: 20-Jan-1970 Gender: Female Account #: [id number] Procedure:                Upper GI endoscopy Indications:              Epigastric abdominal pain, Chest pain (non cardiac) Medicines:                Monitored Anesthesia Care Procedure:                Pre-Anesthesia Assessment:                           - Prior to the procedure, a History and Physical                            was performed, and patient medications and                            allergies were reviewed. The patient's tolerance of                            previous anesthesia was also reviewed. The risks                            and benefits of the procedure and the sedation                            options and risks were discussed with the patient.                            All questions were answered, and informed consent                            was obtained. Prior Anticoagulants: The patient has                            taken no previous anticoagulant or antiplatelet                            agents. ASA Grade Assessment: III - A patient with                            severe systemic disease. After reviewing the risks                            and benefits, the patient was deemed in                            satisfactory condition to undergo the procedure.                           After obtaining informed consent, the endoscope was  passed under direct vision. Throughout the                            procedure, the patient's blood pressure, pulse, and                            oxygen saturations were monitored continuously. The                            Endoscope was introduced through the mouth, and                            advanced to the second part of duodenum. The upper                            GI  endoscopy was accomplished without difficulty.                            The patient tolerated the procedure well. Scope In: Scope Out: Findings:                 The larynx was normal.                           The examined duodenum was normal.                           The stomach was normal.                           The cardia and gastric fundus were normal on                            retroflexion.                           The examined esophagus was normal. The BRAVO                            capsule with delivery system was introduced through                            the mouth and advanced into the esophagus, such                            that the BRAVO pH capsule was positioned 32 cm from                            the incisors, which was 6 cm proximal to the GE                            junction. The BRAVO pH capsule was then deployed                            and attached to the  esophageal mucosa. The delivery                            system was then withdrawn. Endoscopy was utilized                            for probe placement and diagnostic evaluation. The                            scope was reinserted to evaluate placement of the                            BRAVO capsule. Visualization showed the BRAVO                            capsule to be in an appropriate position. Complications:            No immediate complications. Estimated Blood Loss:     Estimated blood loss: none. Impression:               - Normal larynx.                           - Normal examined duodenum.                           - Normal stomach.                           - Normal esophagus.                           - The BRAVO pH capsule was deployed.                           - No specimens collected. Recommendation:           - Patient has a contact number available for                            emergencies. The signs and symptoms of potential                            delayed  complications were discussed with the                            patient. Return to normal activities tomorrow.                            Written discharge instructions were provided to the                            patient.                           - Resume previous diet.                           -  Continue present medications.                           - See the other procedure note for documentation of                            additional recommendations.                           - Review pH study when data available. [name] L. Loletha Carrow, MD 02/19/2021 11:29:53 AM This report has been signed electronically.

## 2021-02-19 NOTE — Op Note (Signed)
[facility] Patient Name: Hannah Barron Procedure Date: 02/19/2021 10:25 AM MRN: [id number] Endoscopist: [place]. Loletha Carrow , MD Age: 51 Referring MD:  Date of Birth: 11/18/1970 Gender: Female Account #: [id number] Procedure:                Colonoscopy Indications:              Screening for colorectal malignant neoplasm, This                            is the patient's first colonoscopy Medicines:                Monitored Anesthesia Care Procedure:                Pre-Anesthesia Assessment:                           - Prior to the procedure, a History and Physical                            was performed, and patient medications and                            allergies were reviewed. The patient's tolerance of                            previous anesthesia was also reviewed. The risks                            and benefits of the procedure and the sedation                            options and risks were discussed with the patient.                            All questions were answered, and informed consent                            was obtained. Prior Anticoagulants: The patient has                            taken no previous anticoagulant or antiplatelet                            agents. ASA Grade Assessment: III - A patient with                            severe systemic disease. After reviewing the risks                            and benefits, the patient was deemed in                            satisfactory condition to undergo the procedure.  After obtaining informed consent, the colonoscope                            was passed under direct vision. Throughout the                            procedure, the patient's blood pressure, pulse, and                            oxygen saturations were monitored continuously. The                            Colonoscope was introduced through the anus and                            advanced to the the cecum,  identified by                            appendiceal orifice and ileocecal valve. The                            colonoscopy was performed without difficulty. The                            patient tolerated the procedure well. The quality                            of the bowel preparation was excellent. The                            ileocecal valve, appendiceal orifice, and rectum                            were photographed. The bowel preparation used was                            Plenvu. Scope In: 10:56:33 AM Scope Out: 11:09:47 AM Scope Withdrawal Time: 0 hours 10 minutes 21 seconds  Total Procedure Duration: 0 hours 13 minutes 14 seconds  Findings:                 The perianal and digital rectal examinations were                            normal.                           A diminutive polyp was found in the proximal                            ascending colon. The polyp was semi-sessile. The                            polyp was removed with a cold biopsy forceps.  Resection and retrieval were complete.                           Repeat examination of right colon under NBI                            performed.                           Internal hemorrhoids were found. The hemorrhoids                            were small.                           The exam was otherwise without abnormality on                            direct and retroflexion views. Complications:            No immediate complications. Estimated Blood Loss:     Estimated blood loss was minimal. Impression:               - One diminutive polyp in the proximal ascending                            colon, removed with a cold biopsy forceps. Resected                            and retrieved.                           - Internal hemorrhoids.                           - The examination was otherwise normal on direct                            and retroflexion views. Recommendation:           -  Patient has a contact number available for                            emergencies. The signs and symptoms of potential                            delayed complications were discussed with the                            patient. Return to normal activities tomorrow.                            Written discharge instructions were provided to the                            patient.                           -  Resume previous diet.                           - Continue present medications.                           - Await pathology results.                           - Repeat colonoscopy is recommended for                            surveillance. The colonoscopy date will be                            determined after pathology results from today's                            exam become available for review.                           - See the other procedure note for documentation of                            additional recommendations. Charisse Wendell L. Loletha Carrow, MD 02/19/2021 11:14:43 AM This report has been signed electronically.

## 2021-02-23 ENCOUNTER — Telehealth: Payer: Self-pay

## 2021-02-23 NOTE — Telephone Encounter (Signed)
°  Follow up Call-  Call back number 02/19/2021  Post procedure Call Back phone  # [phone]  Permission to leave phone message Yes  Some recent data might be hidden     Patient questions:  Do you have a fever, pain , or abdominal swelling? No. Pain Score  0 *  Have you tolerated food without any problems? Yes.    Have you been able to return to your normal activities? Yes.    Do you have any questions about your discharge instructions: Diet   No. Medications  No. Follow up visit  No.  Do you have questions or concerns about your Care? No.  Actions: * If pain score is 4 or above: No action needed, pain <4.

## 2021-03-01 ENCOUNTER — Encounter: Payer: Self-pay | Admitting: Gastroenterology

## 2021-03-09 ENCOUNTER — Telehealth: Payer: Self-pay | Admitting: Internal Medicine

## 2021-03-09 NOTE — Telephone Encounter (Signed)
Left message for patient to call back to schedule Medicare Annual Wellness Visit   Last AWV  03/23/20  Please schedule at anytime with [facility] if patient calls the office back.    40 Minutes appointment   Any questions, please call me at [phone]

## 2021-03-10 ENCOUNTER — Telehealth: Payer: Self-pay | Admitting: Gastroenterology

## 2021-03-10 NOTE — Telephone Encounter (Signed)
Lm on vm for patient to return call 

## 2021-03-10 NOTE — Telephone Encounter (Signed)
Pt returned call. We have reviewed her pH study results and Dr. Loletha Carrow' recommendations. Pt has been advised to continue Omeprazole 40 mg 30-60 minutes before breakfast. I reviewed the antireflux diet and lifestyle changes with the patient. She requested a late afternoon appt because of work. She has been scheduled for a f/u appt with Dr. Loletha Carrow on Friday, 04/02/21 at 3:40 pm. Pt verbalized understanding and had no concerns at the end of the call. ? ?Letter mailed to pt with appt information. ?

## 2021-03-10 NOTE — Telephone Encounter (Signed)
pH study report from recent upper endoscopy indicates that she has significant gastroesophageal reflux.  ?It is not yet clear that fully explains the chest/epigastric pain that has troubled her. ? ?I would like her to continue omeprazole 40 mg daily and focus on usual diet and lifestyle antireflux measures. ? ?Please arrange clinic follow-up with me. ? ?HD ?

## 2021-03-25 ENCOUNTER — Encounter: Payer: Self-pay | Admitting: Internal Medicine

## 2021-03-25 ENCOUNTER — Ambulatory Visit (INDEPENDENT_AMBULATORY_CARE_PROVIDER_SITE_OTHER): Payer: Medicare HMO | Admitting: Internal Medicine

## 2021-03-25 VITALS — BP 120/68 | HR 77 | Temp 98.1°F | Wt 157.0 lb

## 2021-03-25 DIAGNOSIS — Z0001 Encounter for general adult medical examination with abnormal findings: Secondary | ICD-10-CM | POA: Diagnosis not present

## 2021-03-25 DIAGNOSIS — E538 Deficiency of other specified B group vitamins: Secondary | ICD-10-CM

## 2021-03-25 DIAGNOSIS — M5416 Radiculopathy, lumbar region: Secondary | ICD-10-CM | POA: Diagnosis not present

## 2021-03-25 DIAGNOSIS — E7800 Pure hypercholesterolemia, unspecified: Secondary | ICD-10-CM

## 2021-03-25 DIAGNOSIS — R739 Hyperglycemia, unspecified: Secondary | ICD-10-CM | POA: Diagnosis not present

## 2021-03-25 DIAGNOSIS — R1030 Lower abdominal pain, unspecified: Secondary | ICD-10-CM

## 2021-03-25 DIAGNOSIS — R42 Dizziness and giddiness: Secondary | ICD-10-CM

## 2021-03-25 DIAGNOSIS — E559 Vitamin D deficiency, unspecified: Secondary | ICD-10-CM | POA: Diagnosis not present

## 2021-03-25 DIAGNOSIS — R350 Frequency of micturition: Secondary | ICD-10-CM | POA: Diagnosis not present

## 2021-03-25 LAB — CBC WITH DIFFERENTIAL/PLATELET
Basophils Absolute: 0 10*3/uL (ref 0.0–0.1)
Basophils Relative: 0.7 % (ref 0.0–3.0)
Eosinophils Absolute: 0 10*3/uL (ref 0.0–0.7)
Eosinophils Relative: 0.8 % (ref 0.0–5.0)
HCT: 40.6 % (ref 36.0–46.0)
Hemoglobin: 13.4 g/dL (ref 12.0–15.0)
Lymphocytes Relative: 39.1 % (ref 12.0–46.0)
Lymphs Abs: 2.3 10*3/uL (ref 0.7–4.0)
MCHC: 33 g/dL (ref 30.0–36.0)
MCV: 95.1 fl (ref 78.0–100.0)
Monocytes Absolute: 0.4 10*3/uL (ref 0.1–1.0)
Monocytes Relative: 6.9 % (ref 3.0–12.0)
Neutro Abs: 3.1 10*3/uL (ref 1.4–7.7)
Neutrophils Relative %: 52.5 % (ref 43.0–77.0)
Platelets: 245 10*3/uL (ref 150.0–400.0)
RBC: 4.27 Mil/uL (ref 3.87–5.11)
RDW: 13.2 % (ref 11.5–15.5)
WBC: 5.9 10*3/uL (ref 4.0–10.5)

## 2021-03-25 LAB — BASIC METABOLIC PANEL
BUN: 9 mg/dL (ref 6–23)
CO2: 29 mEq/L (ref 19–32)
Calcium: 10.2 mg/dL (ref 8.4–10.5)
Chloride: 101 mEq/L (ref 96–112)
Creatinine, Ser: 0.68 mg/dL (ref 0.40–1.20)
GFR: 101.11 mL/min (ref >60.00)
Glucose, Bld: 93 mg/dL (ref 70–99)
Potassium: 3.9 mEq/L (ref 3.5–5.1)
Sodium: 139 mEq/L (ref 135–145)

## 2021-03-25 LAB — URINALYSIS, ROUTINE W REFLEX MICROSCOPIC
Bilirubin Urine: NEGATIVE
Hgb urine dipstick: NEGATIVE
Ketones, ur: NEGATIVE
Leukocytes,Ua: NEGATIVE
Nitrite: NEGATIVE
RBC / HPF: NONE SEEN (ref 0–?)
Specific Gravity, Urine: 1.01 (ref 1.000–1.030)
Total Protein, Urine: NEGATIVE
Urine Glucose: NEGATIVE
Urobilinogen, UA: 0.2 (ref 0.0–1.0)
pH: 6 (ref 5.0–8.0)

## 2021-03-25 LAB — LIPID PANEL
Cholesterol: 160 mg/dL (ref 0–200)
HDL: 70.9 mg/dL (ref >39.00)
LDL Cholesterol: 76 mg/dL (ref 0–99)
NonHDL: 89.12
Total CHOL/HDL Ratio: 2
Triglycerides: 67 mg/dL (ref 0.0–149.0)
VLDL: 13.4 mg/dL (ref 0.0–40.0)

## 2021-03-25 LAB — HEPATIC FUNCTION PANEL
ALT: 17 U/L (ref 0–35)
AST: 19 U/L (ref 0–37)
Albumin: 4.7 g/dL (ref 3.5–5.2)
Alkaline Phosphatase: 74 U/L (ref 39–117)
Bilirubin, Direct: 0.1 mg/dL (ref 0.0–0.3)
Total Bilirubin: 0.3 mg/dL (ref 0.2–1.2)
Total Protein: 8.4 g/dL — ABNORMAL HIGH (ref 6.0–8.3)

## 2021-03-25 LAB — VITAMIN D 25 HYDROXY (VIT D DEFICIENCY, FRACTURES): VITD: 27.61 ng/mL — ABNORMAL LOW (ref 30.00–100.00)

## 2021-03-25 LAB — HEMOGLOBIN A1C: Hgb A1c MFr Bld: 6.5 % (ref 4.6–6.5)

## 2021-03-25 LAB — TSH: TSH: 0.76 u[IU]/mL (ref 0.35–5.50)

## 2021-03-25 LAB — VITAMIN B12: Vitamin B-12: 1217 pg/mL — ABNORMAL HIGH (ref 211–911)

## 2021-03-25 MED ORDER — CHOLECALCIFEROL 50 MCG (2000 UT) PO TABS: ORAL_TABLET | ORAL | 99 refills | Status: DC

## 2021-03-25 MED ORDER — CYCLOBENZAPRINE HCL 5 MG PO TABS: 5.0000 mg | ORAL_TABLET | Freq: Three times a day (TID) | ORAL | 1 refills | Status: DC | PRN

## 2021-03-25 MED ORDER — MECLIZINE HCL 12.5 MG PO TABS: 12.5000 mg | ORAL_TABLET | Freq: Three times a day (TID) | ORAL | 1 refills | Status: AC | PRN

## 2021-03-25 MED ORDER — MELOXICAM 15 MG PO TABS: 15.0000 mg | ORAL_TABLET | Freq: Every day | ORAL | 2 refills | Status: DC

## 2021-03-25 NOTE — Progress Notes (Signed)
Patient ID: Hannah Barron, female   DOB: [birth year], 51 y.o.   MRN: [id number] ? ? ? ?     Chief Complaint:: wellness exam and Office Visit (Lightheaded, headache, and elevated BP) ? ,, low back pain, lower abd pain/soreness, left leg pain, low vit d ? ?     HPI:  Hannah Barron is a 51 y.o. female here for wellness exam; declines shingrix and covid booster, o/w up to date  ?         ?              Also Pt continues to have recurring LBP without 2 wks overall worsening pain to the right lower back and lower back and leg, but no bowel or bladder change, fever, wt loss,  worsening LE weakness, gait change or falls, especially on the left where she has had increases frequency and severity of left leg radicaular pain.  Pt denies chest pain, increased sob or doe, wheezing, orthopnea, PND, increased LE swelling, palpitations, dizziness or syncope, but has had increased vertigo dizziness with head and body position change with generalized weakness resulting as well, mild, intermittent in the past 2 wks.  Not taking Vit D.  Also has incidental 2 days lower abd pain and soreness more to the right and mid lower aspect with ? Urinary freqency, mild, intermittent, nothing else makes better or worse.  ?  ?Wt Readings from Last 3 Encounters:  ?03/25/21 157 lb (71.2 kg)  ?02/19/21 168 lb (76.2 kg)  ?01/05/21 168 lb 4 oz (76.3 kg)  ? ?BP Readings from Last 3 Encounters:  ?03/25/21 120/68  ?02/19/21 118/76  ?01/05/21 (!) 122/58  ? ?Immunization History  ?Administered Date(s) Administered  ? Influenza,inj,Quad PF,6+ Mos 11/07/2015, 10/26/2016, 09/20/2017, 09/03/2018, 10/08/2019, 10/27/2020  ? PFIZER(Purple Top)SARS-COV-2 Vaccination 03/11/2019, 04/01/2019, 01/18/2020  ? Pneumococcal Polysaccharide-23 11/07/2015  ? ?There are no preventive care reminders to display for this patient. ? ?  ? ?Past Medical History:  ?Diagnosis Date  ? Anemia   ? Bronchiectasis ([place])   ? COPD (chronic obstructive pulmonary disease) ([place])   ? GERD  (gastroesophageal reflux disease)   ? Hyperlipidemia   ? Migraines   ? Paroxysmal SVT (supraventricular tachycardia) ([facility])   ? a. diagnosed in 11/2015.  ? Right leg numbness   ? ?Past Surgical History:  ?Procedure Laterality Date  ? CESAREAN SECTION    ? CYSTOSCOPY N/A 04/05/2016  ? Procedure: CYSTOSCOPY;  Surgeon: Lavonia Drafts, MD;  Location: [facility];  Service: Gynecology;  Laterality: N/A;  ? LAPAROSCOPIC VAGINAL HYSTERECTOMY WITH SALPINGECTOMY Bilateral 04/05/2016  ? Procedure: LAPAROSCOPIC ASSISTED VAGINAL HYSTERECTOMY WITH SALPINGECTOMY;  Surgeon: Lavonia Drafts, MD;  Location: [facility];  Service: Gynecology;  Laterality: Bilateral;  ? LUNG BIOPSY    ? VIDEO BRONCHOSCOPY Bilateral 03/29/2016  ? Procedure: VIDEO BRONCHOSCOPY WITHOUT FLUORO;  Surgeon: Marshell Garfinkel, MD;  Location: [facility];  Service: Cardiopulmonary;  Laterality: Bilateral;  ? ? reports that she has never smoked. She has never used smokeless tobacco. She reports that she does not drink alcohol and does not use drugs. ?family history includes Healthy in her father and mother. ?No Known Allergies ?Current Outpatient Medications on File Prior to Visit  ?Medication Sig Dispense Refill  ? albuterol (VENTOLIN HFA) 108 (90 Base) MCG/ACT inhaler INHALE 2 PUFFS INTO THE LUNGS EVERY 6 HOURS AS NEEDED FOR WHEEZING OR SHORTNESS OF BREATH 8 each 2  ? atorvastatin (LIPITOR) 20 MG tablet Take 1 tablet (20 mg total) by mouth daily. [place]  tablet 3  ? budesonide-formoterol (SYMBICORT) 160-4.5 MCG/ACT inhaler Inhale 2 puffs into the lungs 2 (two) times daily. 1 each 5  ? clonazePAM (KLONOPIN) 0.5 MG tablet Take 1 tablet (0.5 mg total) by mouth 2 (two) times daily as needed for anxiety. 60 tablet 0  ? dicyclomine (BENTYL) 20 MG tablet Take 1 tablet (20 mg total) by mouth every 6 (six) hours. 60 tablet 5  ? estradiol (CLIMARA) 0.05 mg/24hr patch Place 1 patch (0.05 mg total) onto the skin once a week. 4 patch 12  ? gabapentin (NEURONTIN) 100 MG capsule  Take 1 capsule (100 mg total) by mouth 3 (three) times daily as needed. 90 capsule 5  ? metoprolol succinate (TOPROL-XL) 50 MG 24 hr tablet TAKE 1 TABLET IN MORNING & 1/2 TABLET IN EVENING *TAKE 1/2 TAB AS NEEDED FOR PALPITATIONS/RAPID HR* 180 tablet 3  ? naproxen (NAPROSYN) 500 MG tablet Take 1 tablet (500 mg total) by mouth 2 (two) times daily with a meal. 60 tablet 3  ? omeprazole (PRILOSEC) 40 MG capsule Take 1 capsule (40 mg total) by mouth daily. 90 capsule 3  ? prednisoLONE acetate (PRED FORTE) 1 % ophthalmic suspension     ? predniSONE (DELTASONE) 20 MG tablet Take 2 tablets (40 mg total) by mouth daily with breakfast. 10 tablet 0  ? Respiratory Therapy Supplies (FLUTTER) DEVI Use as directed. 1 each 0  ? RESTASIS 0.05 % ophthalmic emulsion   2  ? sodium chloride HYPERTONIC 3 % nebulizer solution Take by nebulization in the morning and at bedtime. J47.9 750 mL 11  ? traMADol (ULTRAM) 50 MG tablet Take 1 tablet (50 mg total) by mouth every 6 (six) hours as needed. 30 tablet 0  ? traZODone (DESYREL) 50 MG tablet Take 0.5-1 tablets (25-50 mg total) by mouth at bedtime as needed for sleep. 90 tablet 1  ? ?No current facility-administered medications on file prior to visit.  ? ?     ROS:  All others reviewed and negative. ? ?Objective  ? ?     PE:  BP 120/68 (BP Location: Right Arm, Patient Position: Sitting, Cuff Size: Large)   Pulse 77   Temp 98.1 ?F (36.7 ?C) (Oral)   Wt 157 lb (71.2 kg)   LMP 03/31/2016   SpO2 99%   BMI 24.59 kg/m?  ? ?              Constitutional: Pt appears in NAD ?              HENT: Head: NCAT.  ?              Right Ear: External ear normal.   ?              Left Ear: External ear normal.  ?              Eyes: . Pupils are equal, round, and reactive to light. Conjunctivae and EOM are normal ?              Nose: without d/c or deformity ?              Neck: Neck supple. Gross normal ROM ?              Cardiovascular: Normal rate and regular rhythm.   ?              Pulmonary/Chest:  Effort normal and breath sounds without rales or wheezing.  ?  Spine nontender in midline; has some tender to left SI joint area, also right lumbar muscular spasm tender noted ?              Abd:  Soft, mild tender low mid and right suprapubic tender without guarding or rebound, ND, + BS, no organomegaly ?              Neurological: Pt is alert. At baseline orientation, motor 5/5 intact to both LE's ?              Skin: Skin is warm. No rashes, no other new lesions, LE edema - none ?              Psychiatric: Pt behavior is normal without agitation  ? ?Micro: none ? ?Cardiac tracings I have personally interpreted today:  none ? ?Pertinent Radiological findings (summarize): none  ? ?Lab Results  ?Component Value Date  ? WBC 5.9 03/25/2021  ? HGB 13.4 03/25/2021  ? HCT 40.6 03/25/2021  ? PLT 245.0 03/25/2021  ? GLUCOSE 93 03/25/2021  ? CHOL 160 03/25/2021  ? TRIG 67.0 03/25/2021  ? HDL 70.90 03/25/2021  ? [place] 76 03/25/2021  ? ALT 17 03/25/2021  ? AST 19 03/25/2021  ? NA 139 03/25/2021  ? K 3.9 03/25/2021  ? CL 101 03/25/2021  ? CREATININE 0.68 03/25/2021  ? BUN 9 03/25/2021  ? CO2 29 03/25/2021  ? TSH 0.76 03/25/2021  ? INR 1.1 10/27/2014  ? HGBA1C 6.5 03/25/2021  ? ?Assessment/Plan:  ?Hannah Barron is a 51 y.o. Other or two or more races [6] female with  has a past medical history of Anemia, Bronchiectasis ([place]), COPD (chronic obstructive pulmonary disease) ([place]), GERD (gastroesophageal reflux disease), Hyperlipidemia, Migraines, Paroxysmal SVT (supraventricular tachycardia) ([place]), and Right leg numbness. ? ?Vitamin D deficiency ?Last vitamin D ?Lab Results  ?Component Value Date  ? VD25OH 30.81 03/23/2020  ? ?Low, to start oral replacement ? ? ?Encounter for well adult exam with abnormal findings ?Age and sex appropriate education and counseling updated with regular exercise and diet ?Referrals for preventative services - none needed ?Immunizations addressed - declines shiingrix and covid  boster ?Smoking counseling  - none needed ?Evidence for depression or other mood disorder - none significant ?Most recent labs reviewed. ?I have personally reviewed and have noted: ?1) the patient's medical and social history ?2) The pa

## 2021-03-25 NOTE — Assessment & Plan Note (Signed)
Last vitamin D ?Lab Results  ?Component Value Date  ? VD25OH 30.81 03/23/2020  ? ?Low, to start oral replacement ? ?

## 2021-03-25 NOTE — Patient Instructions (Addendum)
Please take all new medication as prescribed  - the meclizine as needed for vertigo, as well as the cyclobenzaprine muscle relaxer, and mobic (anti-inflammatory) as needed for the lower back pain ? ?You will be contacted regarding the referral for: sports medicine for the back and left leg pain ? ?Please take OTC Vitamin D3 at 2000 units per day, indefinitely ? ?Please continue all other medications as before, and refills have been done if requested. ? ?Please have the pharmacy call with any other refills you may need. ? ?Please continue your efforts at being more active, low cholesterol diet, and weight control. ? ?You are otherwise up to date with prevention measures today. ? ?Please keep your appointments with your specialists as you may have planned ? ?Please go to the LAB at the blood drawing area for the tests to be done ?You will be contacted by phone if any changes need to be made immediately.  Otherwise, you will receive a letter about your results with an explanation, but please check with MyChart first. ? ?Please remember to sign up for MyChart if you have not done so, as this will be important to you in the future with finding out test results, communicating by private email, and scheduling acute appointments online when needed. ? ?Please make an Appointment to return for your 1 year visit, or sooner if needed ?

## 2021-03-29 ENCOUNTER — Encounter: Payer: Self-pay | Admitting: Internal Medicine

## 2021-03-29 DIAGNOSIS — R42 Dizziness and giddiness: Secondary | ICD-10-CM | POA: Insufficient documentation

## 2021-03-29 NOTE — Assessment & Plan Note (Addendum)
With recent flare mild to mod, pt requests referral sport medicine, also has right lumbar msk spasm but declines muscle relaxer for now ?

## 2021-03-29 NOTE — Assessment & Plan Note (Signed)
Lab Results  ?Component Value Date  ? [place] 76 03/25/2021  ? ?Stable, pt to continue current statin lipitor 20 ? ?

## 2021-03-29 NOTE — Assessment & Plan Note (Signed)
With mild lower abd discomfort , for urine culture ?

## 2021-03-29 NOTE — Assessment & Plan Note (Signed)
Age and sex appropriate education and counseling updated with regular exercise and diet ?Referrals for preventative services - none needed ?Immunizations addressed - declines shiingrix and covid boster ?Smoking counseling  - none needed ?Evidence for depression or other mood disorder - none significant ?Most recent labs reviewed. ?I have personally reviewed and have noted: ?1) the patient's medical and social history ?2) The patient's current medications and supplements ?3) The patient's height, weight, and BMI have been recorded in the chart ? ?

## 2021-03-29 NOTE — Assessment & Plan Note (Signed)
Mild worsening recent, for melcizine prn ?

## 2021-04-02 ENCOUNTER — Ambulatory Visit: Payer: Medicare HMO | Admitting: Gastroenterology

## 2021-04-02 ENCOUNTER — Encounter: Payer: Self-pay | Admitting: Gastroenterology

## 2021-04-02 ENCOUNTER — Ambulatory Visit: Payer: Medicare HMO

## 2021-04-02 ENCOUNTER — Ambulatory Visit: Payer: Medicare HMO | Admitting: Family Medicine

## 2021-04-02 VITALS — BP 118/74 | HR 93 | Ht 67.0 in | Wt 159.8 lb

## 2021-04-02 DIAGNOSIS — R0789 Other chest pain: Secondary | ICD-10-CM | POA: Diagnosis not present

## 2021-04-02 DIAGNOSIS — K5909 Other constipation: Secondary | ICD-10-CM

## 2021-04-02 DIAGNOSIS — K219 Gastro-esophageal reflux disease without esophagitis: Secondary | ICD-10-CM

## 2021-04-02 DIAGNOSIS — R1013 Epigastric pain: Secondary | ICD-10-CM | POA: Diagnosis not present

## 2021-04-02 NOTE — Progress Notes (Signed)
? ? ? ?[facility] Progress Note ? ?Chief Complaint: Chest pain ? ?Subjective  ?History: ?From my last office note 01/05/2021: ?"While this pain has a burning quality and sometimes occurs after meals, most of this favors a musculoskeletal abdominal/chest wall because of pain.  Inconsistent improvement on PPI.  She has had musculoskeletal chest pain in the past.  Also history of nonulcer dyspepsia.   " ? ?On 02/19/2021, she underwent colonoscopy and EGD with wireless pH device placement. ?Colonoscopy had a diminutive adenoma (7-year recall), and internal hemorrhoids. ?EGD normal study, pH study revealed an elevated DeMeester score indicating reflux, with episodes occurring both during the daytime and overnight. ? ?Of note, she had inconsistent and at times no improvement on PPI prior to the study. ? ?She remains about the same, with some intermittent chest pain that is largely with position and movement, she cannot sit long before it starts to bother her.  It is often difficult to get a comfortable position for sleep.  It is the same anterior lower sternal chest wall tenderness I have examined in the past.  Lately she has had some intermittent constipation with bloating. ? ? ?ROS: ?Cardiovascular:   ?Respiratory: no dyspnea ?Appetite good, weight stable ?Intermittent anxiety ?The patient's Past Medical, Family and Social History were reviewed and are on file in the EMR. ? ?Objective: ? ?Med list reviewed ? ?Current Outpatient Medications:  ?  albuterol (VENTOLIN HFA) 108 (90 Base) MCG/ACT inhaler, INHALE 2 PUFFS INTO THE LUNGS EVERY 6 HOURS AS NEEDED FOR WHEEZING OR SHORTNESS OF BREATH, Disp: 8 each, Rfl: 2 ?  atorvastatin (LIPITOR) 20 MG tablet, Take 1 tablet (20 mg total) by mouth daily., Disp: 90 tablet, Rfl: 3 ?  budesonide-formoterol (SYMBICORT) 160-4.5 MCG/ACT inhaler, Inhale 2 puffs into the lungs 2 (two) times daily., Disp: 1 each, Rfl: 5 ?  Cholecalciferol 50 MCG (2000 UT) TABS, 1 tab by mouth once  daily, Disp: 30 tablet, Rfl: 99 ?  clonazePAM (KLONOPIN) 0.5 MG tablet, Take 1 tablet (0.5 mg total) by mouth 2 (two) times daily as needed for anxiety., Disp: 60 tablet, Rfl: 0 ?  cyclobenzaprine (FLEXERIL) 5 MG tablet, Take 1 tablet (5 mg total) by mouth 3 (three) times daily as needed for muscle spasms., Disp: 40 tablet, Rfl: 1 ?  dicyclomine (BENTYL) 20 MG tablet, Take 1 tablet (20 mg total) by mouth every 6 (six) hours., Disp: 60 tablet, Rfl: 5 ?  estradiol (CLIMARA) 0.05 mg/24hr patch, Place 1 patch (0.05 mg total) onto the skin once a week., Disp: 4 patch, Rfl: 12 ?  meclizine (ANTIVERT) 12.5 MG tablet, Take 1 tablet (12.5 mg total) by mouth 3 (three) times daily as needed for dizziness., Disp: 40 tablet, Rfl: 1 ?  meloxicam (MOBIC) 15 MG tablet, Take 1 tablet (15 mg total) by mouth daily., Disp: 90 tablet, Rfl: 2 ?  metoprolol succinate (TOPROL-XL) 50 MG 24 hr tablet, TAKE 1 TABLET IN MORNING & 1/2 TABLET IN EVENING *TAKE 1/2 TAB AS NEEDED FOR PALPITATIONS/RAPID HR*, Disp: 180 tablet, Rfl: 3 ?  naproxen (NAPROSYN) 500 MG tablet, Take 1 tablet (500 mg total) by mouth 2 (two) times daily with a meal., Disp: 60 tablet, Rfl: 3 ?  omeprazole (PRILOSEC) 40 MG capsule, Take 1 capsule (40 mg total) by mouth daily., Disp: 90 capsule, Rfl: 3 ?  prednisoLONE acetate (PRED FORTE) 1 % ophthalmic suspension, , Disp: , Rfl:  ?  predniSONE (DELTASONE) 20 MG tablet, Take 2 tablets (40 mg total) by mouth  daily with breakfast., Disp: 10 tablet, Rfl: 0 ?  Respiratory Therapy Supplies (FLUTTER) DEVI, Use as directed., Disp: 1 each, Rfl: 0 ?  RESTASIS 0.05 % ophthalmic emulsion, , Disp: , Rfl: 2 ?  sodium chloride HYPERTONIC 3 % nebulizer solution, Take by nebulization in the morning and at bedtime. J47.9 (Patient not taking: Reported on 04/02/2021), Disp: 750 mL, Rfl: 11 ?  traMADol (ULTRAM) 50 MG tablet, Take 1 tablet (50 mg total) by mouth every 6 (six) hours as needed., Disp: 30 tablet, Rfl: 0 ?  traZODone (DESYREL) 50 MG  tablet, Take 0.5-1 tablets (25-50 mg total) by mouth at bedtime as needed for sleep., Disp: 90 tablet, Rfl: 1 ? ? ?Vital signs in last 24 hrs: ?Vitals:  ? 04/02/21 1534  ?BP: 118/74  ?Pulse: 93  ?SpO2: 96%  ? ?Wt Readings from Last 3 Encounters:  ?04/02/21 159 lb 12.8 oz (72.5 kg)  ?03/25/21 157 lb (71.2 kg)  ?02/19/21 168 lb (76.2 kg)  ?  ?Physical Exam ? ?Well-appearing ?HEENT: sclera anicteric, oral mucosa moist without lesions ?Neck: supple, no thyromegaly, JVD or lymphadenopathy ?Cardiac: RRR without murmurs, S1S2 heard, no peripheral edema.  Lower chest wall tenderness as before ?Pulm: clear to auscultation bilaterally, normal RR and effort noted ?Abdomen: soft,  with active bowel sounds. No guarding or palpable hepatosplenomegaly. ?Skin; warm and dry, no jaundice or rash ? ?Labs: ? ? ?___________________________________________ ?Radiologic studies: ? ? ?____________________________________________ ?Other: ? ? ?_____________________________________________ ?Assessment & Plan  ?Assessment: ?Encounter Diagnoses  ?Name Primary?  ? Epigastric pain Yes  ? Other chest pain   ? Gastroesophageal reflux disease without esophagitis   ? Chronic constipation   ? ?Epigastric/chest pain that is musculoskeletal and longstanding.  I believe she is been tried on different therapies without much success.  I wonder if sports medicine, some kind of therapy or topical treatment like Voltaren gel might be helpful.  I will forward my note to primary care for their consideration. ? ?She has reflux demonstrated on pH testing, though inconsistent symptoms to correlate with that.  She is also had inconsistent improvement on PPI over many years, and I think that is because her chest pain is largely this musculoskeletal phenomenon. ?I recommended she decrease the omeprazole to 1 tablet every other day for 8 to 10 days and then stop it and see if anything changes. ? ?Colace 200 mg every night, 1/2-1 cap of MiraLAX as needed to relieve  constipation. ?Increase dietary fiber and exercise ? ?She was reassured that her recent endoscopic testing turned out well as she had been increasingly concerned there may be some worrisome digestive condition. ?Laquita will come to see me as needed. ? ?30 minutes were spent on this encounter (including chart review, history/exam, counseling/coordination of care, and documentation) > 50% of that time was spent on counseling and coordination of care.  ? ?Nelida Meuse III ? ?

## 2021-04-02 NOTE — Patient Instructions (Addendum)
We have sent the following medications to your pharmacy for you to pick up at your convenience: ?colace 200 mg every evening ?Miralax 1/2-1 capful daily as needed for constipation ? ?Please follow up with Dr Loletha Carrow as needed. ? ?If you are age 51 or older, your body mass index should be between 23-30. Your Body mass index is 25.03 kg/m?Marland Kitchen If this is out of the aforementioned range listed, please consider follow up with your Primary Care Provider. ? ?If you are age 23 or younger, your body mass index should be between 19-25. Your Body mass index is 25.03 kg/m?Marland Kitchen If this is out of the aformentioned range listed, please consider follow up with your Primary Care Provider.  ? ?________________________________________________________ ? ?The [facility] providers would like to encourage you to use [facility] to communicate with providers for non-urgent requests or questions.  Due to long hold times on the telephone, sending your provider a message by [facility] may be a faster and more efficient way to get a response.  Please allow 48 business hours for a response.  Please remember that this is for non-urgent requests.  ?_______________________________________________________ ? ?Due to recent changes in healthcare laws, you may see the results of your imaging and laboratory studies on MyChart before your provider has had a chance to review them.  We understand that in some cases there may be results that are confusing or concerning to you. Not all laboratory results come back in the same time frame and the provider may be waiting for multiple results in order to interpret others.  Please give [place] 48 hours in order for your provider to thoroughly review all the results before contacting the office for clarification of your results.  ? ?It was a pleasure to see you today! ? ?Thank you for trusting me with your gastrointestinal care!   ? ? ?

## 2021-04-03 ENCOUNTER — Telehealth: Payer: Self-pay | Admitting: Internal Medicine

## 2021-04-03 DIAGNOSIS — R0789 Other chest pain: Secondary | ICD-10-CM

## 2021-04-03 NOTE — Telephone Encounter (Signed)
-----   Message from Doran Stabler, MD sent at 04/02/2021  4:21 PM EDT ----- ?Dr. Jenny Reichmann, ? ?This nice lady is still troubled by longstanding anterior lower chest wall pain and tenderness. ?Not sure what more he may have to offer for this, though may be something in the realm of sports medicine or other therapy may be helpful.  Not sure if she is ever used Voltaren gel, but I will leave it to your discretion. ? ? -Wilfrid Lund ?[facility] ?

## 2021-04-03 NOTE — Telephone Encounter (Signed)
Please to contact pt  ? ?I have referred to sports medicine first floor for her persistent chest wall pain suggested per Dr Loletha Carrow ?

## 2021-04-05 NOTE — Telephone Encounter (Signed)
Left voicemail for patient to notify of referral.  ?

## 2021-04-06 ENCOUNTER — Ambulatory Visit: Payer: Medicare HMO

## 2021-04-28 ENCOUNTER — Ambulatory Visit: Payer: Medicare HMO | Admitting: Pulmonary Disease

## 2021-04-28 ENCOUNTER — Encounter: Payer: Self-pay | Admitting: Pulmonary Disease

## 2021-04-28 ENCOUNTER — Ambulatory Visit: Payer: Medicare HMO

## 2021-04-28 ENCOUNTER — Ambulatory Visit (INDEPENDENT_AMBULATORY_CARE_PROVIDER_SITE_OTHER): Payer: Medicare HMO | Admitting: Family Medicine

## 2021-04-28 ENCOUNTER — Ambulatory Visit (INDEPENDENT_AMBULATORY_CARE_PROVIDER_SITE_OTHER): Payer: Medicare HMO

## 2021-04-28 VITALS — BP 114/68 | HR 96 | Temp 98.2°F | Ht 67.0 in | Wt 159.0 lb

## 2021-04-28 VITALS — BP 130/76 | HR 110 | Ht 67.0 in | Wt 159.4 lb

## 2021-04-28 DIAGNOSIS — G8929 Other chronic pain: Secondary | ICD-10-CM

## 2021-04-28 DIAGNOSIS — M25552 Pain in left hip: Secondary | ICD-10-CM

## 2021-04-28 DIAGNOSIS — M5441 Lumbago with sciatica, right side: Secondary | ICD-10-CM

## 2021-04-28 DIAGNOSIS — M5442 Lumbago with sciatica, left side: Secondary | ICD-10-CM | POA: Diagnosis not present

## 2021-04-28 DIAGNOSIS — R0609 Other forms of dyspnea: Secondary | ICD-10-CM | POA: Diagnosis not present

## 2021-04-28 DIAGNOSIS — R0789 Other chest pain: Secondary | ICD-10-CM | POA: Diagnosis not present

## 2021-04-28 MED ORDER — SODIUM CHLORIDE 3 % IN NEBU: INHALATION_SOLUTION | Freq: Two times a day (BID) | RESPIRATORY_TRACT | 11 refills | Status: DC

## 2021-04-28 MED ORDER — BUDESONIDE-FORMOTEROL FUMARATE 160-4.5 MCG/ACT IN AERO: 2.0000 | INHALATION_SPRAY | Freq: Two times a day (BID) | RESPIRATORY_TRACT | 5 refills | Status: DC

## 2021-04-28 MED ORDER — ALBUTEROL SULFATE HFA 108 (90 BASE) MCG/ACT IN AERS: INHALATION_SPRAY | RESPIRATORY_TRACT | 5 refills | Status: DC

## 2021-04-28 NOTE — Patient Instructions (Signed)
I am glad you are doing well with your breathing ?Will reorder Symbicort and albuterol inhaler ?We will reorder the hypertonic saline nebs ?Order high-res CT in the next 3 months and follow-up in clinic after ? ?

## 2021-04-28 NOTE — Progress Notes (Signed)
? ?      Hannah Barron    [id number]    [birth year] ? ?Primary Care Physician:John, Hunt Oris, MD ? ?Referring Physician: Biagio Borg, MD ?[place] [address] ? ?Chief complaint:  Follow up for  ?Cystic bronchiectasis ? ?HPI: ?51 year old with chronic obstructive lung disease, bronchiectasis. She was previously followed at [place] with a VATS biopsy in 2003 and was told she had cystic bronchiectasis. She had followed up with a pulmonologist at [facility] but has moved to [place] on 2017. We have been unable to get records from [place] in spite of multiple attempts. ? ?She is an immigrant from [place] in 1998 and does not report any exposure to tuberculosis. She does not recall getting a BCG vaccine as a child. Underwent a bronchoscopy to evaluate for MAI infection and a positive quantiferon as she was unable to give a good sputum sample. All results are negative except for positive PJP on DFA. She underwent further evaluation with negative HIV and beta glucan tests.  The PJP test was thought to be false positive ?She had a hysterectomy for uterine fibroids  On 3/27 ? ?Last bronchiectasis exacerbation was in July 2020 which was treated with Z-Pak and prednisone ?She has separated from her husband in 2021 and reports significant improvement in stress and breathing issues since then ? ?Interim history: ?Doing well with no issues ?Breathing is stable ?She has chronic cough with whitish mucus ? ?Continues to use the percussion vest every day along with flutter valve ? ?She is back to work now full-time with [employer]  Complains of some epigastric discomfort when she works for a long time.  EGD last month was normal ? ? ?Outpatient Encounter Medications as of 04/28/2021  ?Medication Sig  ? albuterol (VENTOLIN HFA) 108 (90 Base) MCG/ACT inhaler INHALE 2 PUFFS INTO THE LUNGS EVERY 6 HOURS AS NEEDED FOR WHEEZING OR SHORTNESS OF BREATH  ? atorvastatin (LIPITOR) 20 MG tablet Take 1 tablet (20  mg total) by mouth daily.  ? budesonide-formoterol (SYMBICORT) 160-4.5 MCG/ACT inhaler Inhale 2 puffs into the lungs 2 (two) times daily.  ? Cholecalciferol 50 MCG (2000 UT) TABS 1 tab by mouth once daily  ? clonazePAM (KLONOPIN) 0.5 MG tablet Take 1 tablet (0.5 mg total) by mouth 2 (two) times daily as needed for anxiety.  ? cyclobenzaprine (FLEXERIL) 5 MG tablet Take 1 tablet (5 mg total) by mouth 3 (three) times daily as needed for muscle spasms.  ? dicyclomine (BENTYL) 20 MG tablet Take 1 tablet (20 mg total) by mouth every 6 (six) hours.  ? estradiol (CLIMARA) 0.05 mg/24hr patch Place 1 patch (0.05 mg total) onto the skin once a week.  ? meclizine (ANTIVERT) 12.5 MG tablet Take 1 tablet (12.5 mg total) by mouth 3 (three) times daily as needed for dizziness.  ? meloxicam (MOBIC) 15 MG tablet Take 1 tablet (15 mg total) by mouth daily.  ? metoprolol succinate (TOPROL-XL) 50 MG 24 hr tablet TAKE 1 TABLET IN MORNING & 1/2 TABLET IN EVENING *TAKE 1/2 TAB AS NEEDED FOR PALPITATIONS/RAPID HR*  ? naproxen (NAPROSYN) 500 MG tablet Take 1 tablet (500 mg total) by mouth 2 (two) times daily with a meal.  ? omeprazole (PRILOSEC) 40 MG capsule Take 1 capsule (40 mg total) by mouth daily.  ? prednisoLONE acetate (PRED FORTE) 1 % ophthalmic suspension   ? Respiratory Therapy Supplies (FLUTTER) DEVI Use as directed.  ? RESTASIS 0.05 % ophthalmic emulsion   ?  traMADol (ULTRAM) 50 MG tablet Take 1 tablet (50 mg total) by mouth every 6 (six) hours as needed.  ? traZODone (DESYREL) 50 MG tablet Take 0.5-1 tablets (25-50 mg total) by mouth at bedtime as needed for sleep.  ? sodium chloride HYPERTONIC 3 % nebulizer solution Take by nebulization in the morning and at bedtime. J47.9 (Patient not taking: Reported on 04/02/2021)  ? [DISCONTINUED] predniSONE (DELTASONE) 20 MG tablet Take 2 tablets (40 mg total) by mouth daily with breakfast. (Patient not taking: Reported on 04/28/2021)  ? ?No facility-administered encounter medications on  file as of 04/28/2021.  ? ?Physical Exam: ?Blood pressure 114/68, pulse 96, temperature 98.2 ?F (36.8 ?C), temperature source Oral, height '5\' 7"'$  (1.702 m), weight 159 lb (72.1 kg), last menstrual period 03/31/2016, SpO2 100 %. ?Gen:      No acute distress ?HEENT:  EOMI, sclera anicteric ?Neck:     No masses; no thyromegaly ?Lungs:    Clear to auscultation bilaterally; normal respiratory effort ?CV:         Regular rate and rhythm; no murmurs ?Abd:      + bowel sounds; soft, non-tender; no palpable masses, no distension ?Ext:    No edema; adequate peripheral perfusion ?Skin:      Warm and dry; no rash ?Neuro: alert and oriented x 3 ?Psych: normal mood and affect  ? ?Data Reviewed: ?Imaging ?CT high resolution 07/16/15-areas of cystic and varicose bronchiectasis, extensive air trapping, multiple pulmonary nodules. ?CT chest 03/21/16-areas of bronchiectasis, air trapping and pulmonary nodules. Unchanged compared to 2017 ?CT angiogram 12/23/16-stable areas of bronchiectasis, air trapping and pulmonary nodules.  No pulmonary embolism. ?CT chest 05/14/2020-mild progression of right middle lobe bronchiectasis.  Stable pulmonary nodules ?I have reviewed the images personally. ? ?PFT  ?06/2015  ?FVC 2.45 (70%), FEV1 1.54 [56%), F/F 63, TLC 78% ? ?03/16/16 ?FVC 2.29 (69%), FEV1 1.46 (54%), F/F 64 ? ?11/27/2017 ?FVC 2.37 [59%), FEV1 1.53 [48%), F/F 64, TLC 92% ?Severe obstruction ?  ?Labs 03/24/16 ?IgG 1477 ?IgA 134 ?IgM 168 ?IgE 6 ?ANA, CCP, RA- negative ? ?HIV 04/01/16- Negative ?Beta D glucan 04/01/16- Negative ? 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 Discussed INH therapy but defer as per pt wishes ?CF panel 03/25/15- negative for 97 mutations analyzed (report scanned) ? ?Cardiac ?Myocardial perfusion study 12/2015 >neg  Ischemia , EF 55% ?Echo 10/2015 >EF 65%.  ?Echo 11/2018 > EF 65%, normal valves, no wall motion abnormalities  ? ?Bronchoscopy 03/29/16 ?Cultures, AFB, fungal  cultures-negative to date ?PJP DFA-positive ?Cytology-no malignant cells, CD4: CD8 ratio-1.62 ?Cell count WBC-26, 26% lymphs, 50% neutrophils, 24% monocyte macrophage ? ?Assessment:  ?Cystic bronchiectasis ?She's had workup which is negative for alpha-1 antitrypsin, immunoglobulin deficiency, autoimmune disease, cystic fibrosis and mycobacterial infections.   ? ?Overall she reports she is doing well ?Continues on Symbicort, albuterol as needed. ?Mucociliary clearance with percussion vest, flutter valve.  Can use Mucinex over-the-counter as needed ?CT from earlier 2022 reviewed with mild progression in the right middle lobe but clinically she has been stable ? ?Hypertonic saline nebs were ordered at last visit but she never got the delivery.  Will need to reorder ? ?Plan/Recommendations: ?- Continue symbicort, albuterol ?- Continue Flutter valve,  Mucinex, percussion vest ?- Reorder hypertonic saline nebs ?- Follow-up high-res CT ? ?Marshell Garfinkel MD ?[facility] ?04/28/2021, 2:54 PM ? ? ?CC: Biagio Borg, MD ? ? ?

## 2021-04-28 NOTE — Patient Instructions (Addendum)
Thank you for coming in today.   Please get an Xray today before you leave   I've referred you to Physical Therapy.  Let us know if you don't hear from them in one week.   Recheck back in 6 weeks. 

## 2021-04-28 NOTE — Progress Notes (Signed)
? ?I, Hannah Barron, LAT, ATC acting as a scribe for Hannah Leader, MD. ? ?Subjective:   ? ?CC: Low back and L leg pain ? ?HPI: Pt is a 51 y/o female c/o low back and L leg pain ongoing since Oct. When ain first occurred shewas seen by PCP and was prescribed tramadol, prednisone, gabapentin, and was referred to [facility]. Pt locates pain to L side of the low back w/ radiating pain along the lateral aspect of the L hip, L thigh, and lower leg. ? ?Radiating pain: yes ?LE numbness/tingling: yes- at night ?LE weakness: yes ?Aggravates: standing, L-side lying,  ?Treatments tried: prior prednisone, gabapentin, tramadol ? ?Pt also c/o chest wall pain that's been ongoing for years. Pt had been seen for this by [facility] on 04/02/21. Pt locates pain the center of chest, around the xyphoid process and radiates under L breast. ? ?Dx imaging: 11/22/20 L-spine MRI ? 03/23/20 T-spine XR ? 06/28/19 L-spine XR ? 12/31/18 L-spine XR ? ?Pertinent review of Systems: No fevers or chills ? ?Relevant historical information: COPD.  GERD. ? ? ?Objective:   ? ?Vitals:  ? 04/28/21 1232  ?BP: 130/76  ?Pulse: (!) 110  ?SpO2: 97%  ? ?General: Well Developed, well nourished, and in no acute distress.  ? ?MSK: Chest wall: Mildly tender palpation along sternum.  Normal shoulder and chest wall motion. ? ?L-spine: Nontender midline.  Tender palpation lumbar paraspinal musculature. ?Decreased lumbar motion. ? ?Left hip: Normal. ?Decreased range of motion hip flexion and internal rotation both producing pain. ?Strength reduced to flexion and abduction 4/5. ? ?Lower extremity strength otherwise intact. ? ? ?Lab and Radiology Results ? ?X-ray images left hip obtained today personally and independently interpreted. ?No severe DJD.  Minimal changes suggestive of femoral acetabular impingement are present. ?Await formal radiology review ? ?CT scan chest images obtained on May 14, 2020 personally independently interpreted. ?No  significant abnormalities around the sternum or anterior ribs at area of pain visible. ? ? ?EXAM: ?MRI LUMBAR SPINE WITHOUT CONTRAST ?  ?TECHNIQUE: ?Multiplanar, multisequence MR imaging of the lumbar spine was ?performed. No intravenous contrast was administered. ?  ?COMPARISON:  None. ?  ?FINDINGS: ?Segmentation:  Standard. ?  ?Alignment:  Physiologic. ?  ?Vertebrae: No acute fracture, evidence of discitis, or aggressive ?bone lesion. ?  ?Conus medullaris and cauda equina: Conus extends to the T11 level. ?Conus and cauda equina appear normal. ?  ?Paraspinal and other soft tissues: No acute paraspinal abnormality. ?  ?Disc levels: ?  ?Disc spaces: Disc spaces are maintained. ?  ?T12-L1: No significant disc bulge. No neural foraminal stenosis. No ?central canal stenosis. ?  ?L1-L2: No significant disc bulge. No neural foraminal stenosis. No ?central canal stenosis. ?  ?L2-L3: No significant disc bulge. No neural foraminal stenosis. No ?central canal stenosis. ?  ?L3-L4: No significant disc bulge. No neural foraminal stenosis. No ?central canal stenosis. ?  ?L4-L5: No significant disc bulge. No neural foraminal stenosis. No ?central canal stenosis. Mild bilateral facet arthropathy. ?  ?L5-S1: No significant disc bulge. No neural foraminal stenosis. No ?central canal stenosis. ?  ?IMPRESSION: ?1. Mild bilateral facet arthropathy at L4-5. ?2.  No acute osseous injury of the lumbar spine. ?3. No significant lumbar spine disc protrusion, foraminal stenosis ?or central canal stenosis. ?  ?  ?Electronically Signed ?  By: Kathreen Devoid M.D. ?  On: 11/23/2020 07:14 ?I, Hannah Barron, personally (independently) visualized and performed the interpretation of the images attached in this  note. ? ? ?Impression and Recommendations:   ? ?Assessment and Plan: ?51 y.o. female with  ?Chest wall pain.  Etiology is unclear. Tammra has had a relatively extensive work-up with pulmonology and gastroenterology without clear explanation.  At this  point there is concern that it is a MSK issue which I think is pretty likely.  Plan for trial of PT to address potential chest wall musculoskeletal issue.  Recheck in 6 weeks. ? ?Left anterior thigh pain.  Lumbar radiculopathy very unlikely based on lumbar MRI from November not showing evidence of radiculopathy.  Most likely explanation is hip impingement.  She does have pain and dysfunction and lack of range of motion with hip flexion and internal rotation.  X-ray per my read relatively normal-appearing plan for trial of PT and if not improved MRI arthrogram of the hip. ? ?Left low back pain: Spasm and dysfunction most likely explanation.  Plan for PT. ? ?Recheck 6 weeks.. ? ?PDMP not reviewed this encounter. ?Orders Placed This Encounter  ?Procedures  ? DG HIP UNILAT W OR W/O PELVIS 2-3 VIEWS LEFT  ?  Standing Status:   Future  ?  Number of Occurrences:   1  ?  Standing Expiration Date:   05/28/2021  ?  Order Specific Question:   Reason for Exam (SYMPTOM  OR DIAGNOSIS REQUIRED)  ?  Answer:   left hip pain  ?  Order Specific Question:   Preferred imaging location?  ?  Answer:   [facility]  ?  Order Specific Question:   Is patient pregnant?  ?  Answer:   No  ? Ambulatory referral to Physical Therapy  ?  Referral Priority:   Routine  ?  Referral Type:   Physical Medicine  ?  Referral Reason:   Specialty Services Required  ?  Requested Specialty:   Physical Therapy  ?  Number of Visits Requested:   1  ? ?No orders of the defined types were placed in this encounter. ? ? ?Discussed warning signs or symptoms. Please see discharge instructions. Patient expresses understanding. ? ? ?The above documentation has been reviewed and is accurate and complete Hannah Barron, M.D. ? ?

## 2021-05-03 ENCOUNTER — Ambulatory Visit (INDEPENDENT_AMBULATORY_CARE_PROVIDER_SITE_OTHER): Payer: Medicare HMO

## 2021-05-03 DIAGNOSIS — Z0000 Encounter for general adult medical examination without abnormal findings: Secondary | ICD-10-CM | POA: Diagnosis not present

## 2021-05-03 NOTE — Progress Notes (Signed)
?I connected with Hannah Barron today by telephone and verified that I am speaking with the correct person using two identifiers. ?Location patient: home ?Location provider: work ?Persons participating in the virtual visit: patient, provider. ?  ?I discussed the limitations, risks, security and privacy concerns of performing an evaluation and management service by telephone and the availability of in person appointments. I also discussed with the patient that there may be a patient responsible charge related to this service. The patient expressed understanding and verbally consented to this telephonic visit.  ?  ?Interactive audio and video telecommunications were attempted between this provider and patient, however failed, due to patient having technical difficulties OR patient did not have access to video capability.  We continued and completed visit with audio only. ? ?Some vital signs may be absent or patient reported.  ? ?Time Spent with patient on telephone encounter: 30 minutes ? ?Subjective:  ? Hannah Barron is a 51 y.o. female who presents for Medicare Annual (Subsequent) preventive examination. ? ?Review of Systems    ? ?Cardiac Risk Factors include: dyslipidemia ? ?   ?Objective:  ?  ?There were no vitals filed for this visit. ?There is no height or weight on file to calculate BMI. ? ? ?  05/03/2021  ?  4:11 PM 05/04/2020  ?  4:32 PM 03/23/2020  ?  3:10 PM 09/24/2019  ?  8:09 PM 06/23/2017  ?  3:10 PM 02/17/2017  ? 11:01 AM 09/12/2016  ?  2:36 PM  ?Advanced Directives  ?Does Patient Have a Medical Advance Directive? No No No No No No No  ?Would patient like information on creating a medical advance directive? No - Patient declined No - Patient declined No - Patient declined  No - Patient declined No - Patient declined   ? ? ?Current Medications (verified) ?Outpatient Encounter Medications as of 05/03/2021  ?Medication Sig  ? albuterol (VENTOLIN HFA) 108 (90 Base) MCG/ACT inhaler INHALE 2 PUFFS INTO THE LUNGS EVERY 6  HOURS AS NEEDED FOR WHEEZING OR SHORTNESS OF BREATH  ? atorvastatin (LIPITOR) 20 MG tablet Take 1 tablet (20 mg total) by mouth daily.  ? budesonide-formoterol (SYMBICORT) 160-4.5 MCG/ACT inhaler Inhale 2 puffs into the lungs 2 (two) times daily.  ? Cholecalciferol 50 MCG (2000 UT) TABS 1 tab by mouth once daily  ? clonazePAM (KLONOPIN) 0.5 MG tablet Take 1 tablet (0.5 mg total) by mouth 2 (two) times daily as needed for anxiety.  ? cyclobenzaprine (FLEXERIL) 5 MG tablet Take 1 tablet (5 mg total) by mouth 3 (three) times daily as needed for muscle spasms.  ? dicyclomine (BENTYL) 20 MG tablet Take 1 tablet (20 mg total) by mouth every 6 (six) hours.  ? estradiol (CLIMARA) 0.05 mg/24hr patch Place 1 patch (0.05 mg total) onto the skin once a week.  ? meclizine (ANTIVERT) 12.5 MG tablet Take 1 tablet (12.5 mg total) by mouth 3 (three) times daily as needed for dizziness.  ? meloxicam (MOBIC) 15 MG tablet Take 1 tablet (15 mg total) by mouth daily.  ? metoprolol succinate (TOPROL-XL) 50 MG 24 hr tablet TAKE 1 TABLET IN MORNING & 1/2 TABLET IN EVENING *TAKE 1/2 TAB AS NEEDED FOR PALPITATIONS/RAPID HR*  ? naproxen (NAPROSYN) 500 MG tablet Take 1 tablet (500 mg total) by mouth 2 (two) times daily with a meal.  ? omeprazole (PRILOSEC) 40 MG capsule Take 1 capsule (40 mg total) by mouth daily.  ? prednisoLONE acetate (PRED FORTE) 1 % ophthalmic suspension   ?  Respiratory Therapy Supplies (FLUTTER) DEVI Use as directed.  ? RESTASIS 0.05 % ophthalmic emulsion   ? sodium chloride HYPERTONIC 3 % nebulizer solution Take by nebulization in the morning and at bedtime. J47.9  ? traMADol (ULTRAM) 50 MG tablet Take 1 tablet (50 mg total) by mouth every 6 (six) hours as needed.  ? traZODone (DESYREL) 50 MG tablet Take 0.5-1 tablets (25-50 mg total) by mouth at bedtime as needed for sleep.  ? ?No facility-administered encounter medications on file as of 05/03/2021.  ? ? ?Allergies (verified) ?Patient has no known allergies.   ? ?History: ?Past Medical History:  ?Diagnosis Date  ? Anemia   ? Bronchiectasis ([facility])   ? COPD (chronic obstructive pulmonary disease) ([facility])   ? GERD (gastroesophageal reflux disease)   ? Hyperlipidemia   ? Migraines   ? Paroxysmal SVT (supraventricular tachycardia) ([facility])   ? a. diagnosed in 11/2015.  ? Right leg numbness   ? ?Past Surgical History:  ?Procedure Laterality Date  ? CESAREAN SECTION    ? CYSTOSCOPY N/A 04/05/2016  ? Procedure: CYSTOSCOPY;  Surgeon: Lavonia Drafts, MD;  Location: [facility];  Service: Gynecology;  Laterality: N/A;  ? LAPAROSCOPIC VAGINAL HYSTERECTOMY WITH SALPINGECTOMY Bilateral 04/05/2016  ? Procedure: LAPAROSCOPIC ASSISTED VAGINAL HYSTERECTOMY WITH SALPINGECTOMY;  Surgeon: Lavonia Drafts, MD;  Location: [facility];  Service: Gynecology;  Laterality: Bilateral;  ? LUNG BIOPSY    ? VIDEO BRONCHOSCOPY Bilateral 03/29/2016  ? Procedure: VIDEO BRONCHOSCOPY WITHOUT FLUORO;  Surgeon: Marshell Garfinkel, MD;  Location: [facility];  Service: Cardiopulmonary;  Laterality: Bilateral;  ? ?Family History  ?Problem Relation Age of Onset  ? Healthy Mother   ? Healthy Father   ? Colon cancer Neg Hx   ? Stomach cancer Neg Hx   ? Rectal cancer Neg Hx   ? Esophageal cancer Neg Hx   ? ?Social History  ? ?Socioeconomic History  ? Marital status: Married  ?  Spouse name: Not on file  ? Number of children: 1  ? Years of education: 37  ? Highest education level: Not on file  ?Occupational History  ? Occupation: disability  ?Tobacco Use  ? Smoking status: Never  ? Smokeless tobacco: Never  ?Vaping Use  ? Vaping Use: Never used  ?Substance and Sexual Activity  ? Alcohol use: No  ?  Alcohol/week: 0.0 standard drinks  ? Drug use: No  ? Sexual activity: Yes  ?  Birth control/protection: Surgical  ?Other Topics Concern  ? Not on file  ?Social History Narrative  ? Fun: Watch TV  ? ?Social Determinants of Health  ? ?Financial Resource Strain: Low Risk   ? Difficulty of Paying Living Expenses: Not very hard   ?Food Insecurity: No Food Insecurity  ? Worried About Charity fundraiser in the Last Year: Never true  ? Ran Out of Food in the Last Year: Never true  ?Transportation Needs: No Transportation Needs  ? Lack of Transportation (Medical): No  ? Lack of Transportation (Non-Medical): No  ?Physical Activity: Inactive  ? Days of Exercise per Week: 0 days  ? Minutes of Exercise per Session: 0 min  ?Stress: No Stress Concern Present  ? Feeling of Stress : Not at all  ?Social Connections: Moderately Integrated  ? Frequency of Communication with Friends and Family: More than three times a week  ? Frequency of Social Gatherings with Friends and Family: Once a week  ? Attends Religious Services: 1 to 4 times per year  ? Active Member of Clubs  or Organizations: No  ? Attends Archivist Meetings: 1 to 4 times per year  ? Marital Status: Separated  ? ? ?Tobacco Counseling ?Counseling given: Not Answered ? ? ?Clinical Intake: ? ?Pre-visit preparation completed: Yes ? ?Pain : No/denies pain ? ?  ? ?Nutritional Risks: None ?Diabetes: No ? ?How often do you need to have someone help you when you read instructions, pamphlets, or other written materials from your doctor or pharmacy?: 1 - Never ?What is the last grade level you completed in school?: HSG ? ?Diabetic? no ? ?Interpreter Needed?: No ? ?Information entered by :: Lisette Abu, LPN ? ? ?Activities of Daily Living ? ?  05/03/2021  ?  4:25 PM  ?In your present state of health, do you have any difficulty performing the following activities:  ?Hearing? 0  ?Vision? 0  ?Difficulty concentrating or making decisions? 0  ?Walking or climbing stairs? 0  ?Dressing or bathing? 0  ?Doing errands, shopping? 0  ?Preparing Food and eating ? N  ?Using the Toilet? N  ?In the past six months, have you accidently leaked urine? N  ?Do you have problems with loss of bowel control? N  ?Managing your Medications? N  ?Managing your Finances? N  ?Housekeeping or managing your Housekeeping?  N  ? ? ?Patient Care Team: ?Biagio Borg, MD as PCP - General (Internal Medicine) ?Martinique, Peter M, MD as PCP - Cardiology (Cardiology) ? ?Indicate any recent Medical Services you may have received from other than [facility]

## 2021-05-03 NOTE — Patient Instructions (Signed)
Ms. Dominski , ?Thank you for taking time to come for your Medicare Wellness Visit. I appreciate your ongoing commitment to your health goals. Please review the following plan we discussed and let me know if I can assist you in the future.  ? ?Screening recommendations/referrals: ?Colonoscopy: 02/19/2021; due every 7 years ?Mammogram: 04/13/2020; due every year ?Bone Density: never done ?Recommended yearly ophthalmology/optometry visit for glaucoma screening and checkup ?Recommended yearly dental visit for hygiene and checkup ? ?Vaccinations: ?Influenza vaccine: 10/27/2020; due every Fall Season ?Pneumococcal vaccine: 11/07/2015 ?Tdap vaccine: 11/11/2014; due every 10 years ?Shingles vaccine: never done  ?Covid-19: 03/11/2019, 04/01/2019, 01/18/2020 ? ?Advanced directives: No ? ?Conditions/risks identified: Yes ? ?Next appointment: Please schedule your next Annual Wellness Visit with your Health Coach within 1 year by calling [phone]. ? ?Preventive Care 40-64 Years, Female ?Preventive care refers to lifestyle choices and visits with your health care provider that can promote health and wellness. ? ?What does preventive care include? ? ?A yearly physical exam. This is also called an annual well check. ?Dental exams once or twice a year. ?Routine eye exams. Ask your health care provider how often you should have your eyes checked. ?Personal lifestyle choices, including: ?Daily care of your teeth and gums. ?Regular physical activity. ?Eating a healthy diet. ?Avoiding tobacco and drug use. ?Limiting alcohol use. ?Practicing safe sex. ?Taking low-dose aspirin daily starting at age 53. ?Taking vitamin and mineral supplements as recommended by your health care provider. ?What happens during an annual well check? ?The services and screenings done by your health care provider during your annual well check will depend on your age, overall health, lifestyle risk factors, and family history of disease. ?Counseling  ?Your health care  provider may ask you questions about your: ?Alcohol use. ?Tobacco use. ?Drug use. ?Emotional well-being. ?Home and relationship well-being. ?Sexual activity. ?Eating habits. ?Work and work Statistician. ?Method of birth control. ?Menstrual cycle. ?Pregnancy history. ?Screening  ?You may have the following tests or measurements: ?Height, weight, and BMI. ?Blood pressure. ?Lipid and cholesterol levels. These may be checked every 5 years, or more frequently if you are over 50 years old. ?Skin check. ?Lung cancer screening. You may have this screening every year starting at age 59 if you have a 30-pack-year history of smoking and currently smoke or have quit within the past 15 years. ?Fecal occult blood test (FOBT) of the stool. You may have this test every year starting at age 22. ?Flexible sigmoidoscopy or colonoscopy. You may have a sigmoidoscopy every 5 years or a colonoscopy every 10 years starting at age 65. ?Hepatitis C blood test. ?Hepatitis B blood test. ?Sexually transmitted disease (STD) testing. ?Diabetes screening. This is done by checking your blood sugar (glucose) after you have not eaten for a while (fasting). You may have this done every 1-3 years. ?Mammogram. This may be done every 1-2 years. Talk to your health care provider about when you should start having regular mammograms. This may depend on whether you have a family history of breast cancer. ?BRCA-related cancer screening. This may be done if you have a family history of breast, ovarian, tubal, or peritoneal cancers. ?Pelvic exam and Pap test. This may be done every 3 years starting at age 18. Starting at age 79, this may be done every 5 years if you have a Pap test in combination with an HPV test. ?Bone density scan. This is done to screen for osteoporosis. You may have this scan if you are at high risk for osteoporosis. ?  Discuss your test results, treatment options, and if necessary, the need for more tests with your health care  provider. ?Vaccines  ?Your health care provider may recommend certain vaccines, such as: ?Influenza vaccine. This is recommended every year. ?Tetanus, diphtheria, and acellular pertussis (Tdap, Td) vaccine. You may need a Td booster every 10 years. ?Zoster vaccine. You may need this after age 43. ?Pneumococcal 13-valent conjugate (PCV13) vaccine. You may need this if you have certain conditions and were not previously vaccinated. ?Pneumococcal polysaccharide (PPSV23) vaccine. You may need one or two doses if you smoke cigarettes or if you have certain conditions. ?Talk to your health care provider about which screenings and vaccines you need and how often you need them. ?This information is not intended to replace advice given to you by your health care provider. Make sure you discuss any questions you have with your health care provider. ?Document Released: 01/23/2015 Document Revised: 09/16/2015 Document Reviewed: 10/28/2014 ?Elsevier Interactive Patient Education ? 2017 [employer] ? ? ? ?Fall Prevention in the Home ?Falls can cause injuries. They can happen to people of all ages. There are many things you can do to make your home safe and to help prevent falls. ?What can I do on the outside of my home? ?Regularly fix the edges of walkways and driveways and fix any cracks. ?Remove anything that might make you trip as you walk through a door, such as a raised step or threshold. ?Trim any bushes or trees on the path to your home. ?Use bright outdoor lighting. ?Clear any walking paths of anything that might make someone trip, such as rocks or tools. ?Regularly check to see if handrails are loose or broken. Make sure that both sides of any steps have handrails. ?Any raised decks and porches should have guardrails on the edges. ?Have any leaves, snow, or ice cleared regularly. ?Use sand or salt on walking paths during winter. ?Clean up any spills in your garage right away. This includes oil or grease spills. ?What  can I do in the bathroom? ?Use night lights. ?Install grab bars by the toilet and in the tub and shower. Do not use towel bars as grab bars. ?Use non-skid mats or decals in the tub or shower. ?If you need to sit down in the shower, use a plastic, non-slip stool. ?Keep the floor dry. Clean up any water that spills on the floor as soon as it happens. ?Remove soap buildup in the tub or shower regularly. ?Attach bath mats securely with double-sided non-slip rug tape. ?Do not have throw rugs and other things on the floor that can make you trip. ?What can I do in the bedroom? ?Use night lights. ?Make sure that you have a light by your bed that is easy to reach. ?Do not use any sheets or blankets that are too big for your bed. They should not hang down onto the floor. ?Have a firm chair that has side arms. You can use this for support while you get dressed. ?Do not have throw rugs and other things on the floor that can make you trip. ?What can I do in the kitchen? ?Clean up any spills right away. ?Avoid walking on wet floors. ?Keep items that you use a lot in easy-to-reach places. ?If you need to reach something above you, use a strong step stool that has a grab bar. ?Keep electrical cords out of the way. ?Do not use floor polish or wax that makes floors slippery. If you must use wax,  use non-skid floor wax. ?Do not have throw rugs and other things on the floor that can make you trip. ?What can I do with my stairs? ?Do not leave any items on the stairs. ?Make sure that there are handrails on both sides of the stairs and use them. Fix handrails that are broken or loose. Make sure that handrails are as long as the stairways. ?Check any carpeting to make sure that it is firmly attached to the stairs. Fix any carpet that is loose or worn. ?Avoid having throw rugs at the top or bottom of the stairs. If you do have throw rugs, attach them to the floor with carpet tape. ?Make sure that you have a light switch at the top of the  stairs and the bottom of the stairs. If you do not have them, ask someone to add them for you. ?What else can I do to help prevent falls? ?Wear shoes that: ?Do not have high heels. ?Have rubber bottoms. ?Are comfortabl

## 2021-05-03 NOTE — Progress Notes (Signed)
Left hip x-ray looks normal to radiology.

## 2021-05-12 ENCOUNTER — Encounter: Payer: Self-pay | Admitting: Physical Therapy

## 2021-05-12 ENCOUNTER — Ambulatory Visit: Payer: Medicare HMO | Attending: Family Medicine | Admitting: Physical Therapy

## 2021-05-12 DIAGNOSIS — M25552 Pain in left hip: Secondary | ICD-10-CM | POA: Diagnosis not present

## 2021-05-12 DIAGNOSIS — M546 Pain in thoracic spine: Secondary | ICD-10-CM | POA: Insufficient documentation

## 2021-05-12 DIAGNOSIS — M5459 Other low back pain: Secondary | ICD-10-CM | POA: Diagnosis not present

## 2021-05-12 DIAGNOSIS — R0789 Other chest pain: Secondary | ICD-10-CM | POA: Insufficient documentation

## 2021-05-12 DIAGNOSIS — M5441 Lumbago with sciatica, right side: Secondary | ICD-10-CM | POA: Insufficient documentation

## 2021-05-12 DIAGNOSIS — M62830 Muscle spasm of back: Secondary | ICD-10-CM | POA: Diagnosis not present

## 2021-05-12 DIAGNOSIS — M5442 Lumbago with sciatica, left side: Secondary | ICD-10-CM | POA: Insufficient documentation

## 2021-05-12 DIAGNOSIS — M5432 Sciatica, left side: Secondary | ICD-10-CM | POA: Insufficient documentation

## 2021-05-12 DIAGNOSIS — R293 Abnormal posture: Secondary | ICD-10-CM | POA: Insufficient documentation

## 2021-05-12 DIAGNOSIS — G8929 Other chronic pain: Secondary | ICD-10-CM | POA: Diagnosis not present

## 2021-05-12 NOTE — Therapy (Signed)
[facility] ?[place], [place], [address] ?Phone: [phone]   Fax:  [phone] ? ?Physical Therapy Evaluation ? ?Patient Details  ?Name: Hannah Barron ?MRN: [id number] ?Date of Birth: 1970-12-10 ?Referring Provider (PT): Georgina Snell ? ? ?Encounter Date: 05/12/2021 ? ? PT End of Session - 05/12/21 1723   ? ? Visit Number 1   ? Date for PT Re-Evaluation 08/12/21   ? Authorization Type [employer] Medicare   ? PT Start Time 1194   ? PT Stop Time 1740   ? PT Time Calculation (min) 53 min   ? Activity Tolerance Patient tolerated treatment well   ? Behavior During Therapy [facility] for tasks assessed/performed   ? ?  ?  ? ?  ? ? ?Past Medical History:  ?Diagnosis Date  ? Anemia   ? Bronchiectasis ([place])   ? COPD (chronic obstructive pulmonary disease) ([place])   ? GERD (gastroesophageal reflux disease)   ? Hyperlipidemia   ? Migraines   ? Paroxysmal SVT (supraventricular tachycardia) ([facility])   ? a. diagnosed in 11/2015.  ? Right leg numbness   ? ? ?Past Surgical History:  ?Procedure Laterality Date  ? CESAREAN SECTION    ? CYSTOSCOPY N/A 04/05/2016  ? Procedure: CYSTOSCOPY;  Surgeon: Lavonia Drafts, MD;  Location: [facility];  Service: Gynecology;  Laterality: N/A;  ? LAPAROSCOPIC VAGINAL HYSTERECTOMY WITH SALPINGECTOMY Bilateral 04/05/2016  ? Procedure: LAPAROSCOPIC ASSISTED VAGINAL HYSTERECTOMY WITH SALPINGECTOMY;  Surgeon: Lavonia Drafts, MD;  Location: [facility];  Service: Gynecology;  Laterality: Bilateral;  ? LUNG BIOPSY    ? VIDEO BRONCHOSCOPY Bilateral 03/29/2016  ? Procedure: VIDEO BRONCHOSCOPY WITHOUT FLUORO;  Surgeon: Marshell Garfinkel, MD;  Location: [facility];  Service: Cardiopulmonary;  Laterality: Bilateral;  ? ? ?There were no vitals filed for this visit. ? ? ? Subjective Assessment - 05/12/21 1655   ? ? Subjective Patient was seen here last year about a year ago, she did well, she had to travel to [place] and we did not see her after this.  She returns with dx of  the LBP and sciatica, and chest wall pain.  X-rays showed DDD and facet arthropathy in the lumbar area.  She reports that pain has been constant, endoscopy was negative   ? Limitations Sitting;Lifting;Standing;Walking;House hold activities   ? Patient Stated Goals have less pain and tightness   ? Currently in Pain? Yes   ? Pain Score 5    ? Pain Location Back   also chest wall steranl pain with sitting  ? Pain Orientation Lower;Left   ? Pain Descriptors / Indicators Tingling;Burning   ? Pain Radiating Towards numbness in the left thigh   ? Pain Onset More than a month ago   ? Pain Frequency Constant   ? Aggravating Factors  working ,standing pain is worse in the back and the leg pain up to 8/10 , with sitting the chest wall will hurt up to 9/10   ? Pain Relieving Factors standing helps the chest wall, rest will help the back and the leg   ? Effect of Pain on Daily Activities "really sufers with the pain"   ? ?  ?  ? ?  ? ? ? ? ? [facility] PT Assessment - 05/12/21 0001   ? ?  ? Assessment  ? Medical Diagnosis back pain, sciatica and chest wall pain   ? Referring Provider (PT) Georgina Snell   ? Onset Date/Surgical Date 04/21/21   ? Hand Dominance Left   ?  Prior Therapy last year with good results   ?  ? Precautions  ? Precautions None   ?  ? Balance Screen  ? Has the patient fallen in the past 6 months No   ? Has the patient had a decrease in activity level because of a fear of falling?  No   ? Is the patient reluctant to leave their home because of a fear of falling?  No   ?  ? Home Environment  ? Additional Comments has stairs, does housework   ?  ? Prior Function  ? Level of Independence Independent   ? Vocation Full time employment   ? Vocation Requirements mostly standing   ? Leisure no exercise   ?  ? Posture/Postural Control  ? Posture Comments fwd head, rounded shoulders, slouched posture   ?  ? AROM  ? Overall AROM Comments lumbar ROM decreased 50% for flexion , decreased 75% for extension and side bending very stiff and  c/o pain in the left low back and the chest wall   ?  ? Strength  ? Overall Strength Comments Hips and knees 4/5 with some shaking and pain in the back   ?  ? Flexibility  ? Soft Tissue Assessment /Muscle Length yes   ? Hamstrings very tight SLR 50 degrees   ? ITB very tight   ? Piriformis very tight and painful   ?  ? Palpation  ? Palpation comment tight and tender in the lumbar area, the rhomboids , she is very tender at the xyphoid process area  pressure here sends pain to the left rib area, resisted breathing made this pain worse   ? ?  ?  ? ?  ? ? ? ? ? ? ? ? ? ? ? ? ? ?Objective measurements completed on examination: See above findings.  ? ? ? ? ? ? ? ? ? ? ? ? ? ? ? ? PT Short Term Goals - 05/12/21 1727   ? ?  ? PT SHORT TERM GOAL #1  ? Title Pt will be independent with her initial HEP   ? Time 2   ? Period Weeks   ? Status New   ? ?  ?  ? ?  ? ? ? ? PT Long Term Goals - 05/12/21 1728   ? ?  ? PT LONG TERM GOAL #1  ? Title Pt will report pain 4/10 or less in all situations temperature wise in order to improve quality of life.   ? Time 12   ? Period Weeks   ? Status New   ?  ? PT LONG TERM GOAL #2  ? Title increase lumbar ROM 25%   ? Time 12   ? Period Weeks   ? Status New   ?  ? PT LONG TERM GOAL #3  ? Title understand posture and body mechanics   ? Time 12   ? Period Weeks   ? Status New   ?  ? PT LONG TERM GOAL #4  ? Title increase LE strength to 4+/5   ? Time 12   ? Period Weeks   ? Status New   ?  ? PT LONG TERM GOAL #5  ? Title demo SLR to 75 degrees   ? Time 12   ? Period Weeks   ? Status New   ? ?  ?  ? ?  ? ? ? ? ? ? ? ? ? Plan - 05/12/21 1724   ? ?  Clinical Impression Statement Patient was seen here last year for neck and back pain, she did well with PT and the pain got less, she did have to travel to [place] and we did not see her after that.  She returns with left low back pain with sciatica of the left leg, she has a lot of tension and tightnes sin the rhomboids and the upper traps, she also has a  c/o severe chest wall pain mostly at the xyphoid process and some radiating pain into the left rib area.  She is very tender at the xyphoid.  She reports that and endoscopy was negative.  Her trunk ROM is very limited and painful in the back and the chest, her LE's are very tight and painful with any flexibility tests   ? Stability/Clinical Decision Making Stable/Uncomplicated   ? Clinical Decision Making Low   ? Rehab Potential Good   ? PT Frequency 2x / week   ? PT Duration 12 weeks   ? PT Treatment/Interventions ADLs/Self Care Home Management;Electrical Stimulation;Moist Heat;Traction;Ultrasound;Functional mobility training;Therapeutic activities;Therapeutic exercise;Balance training;Neuromuscular re-education;Manual techniques;Dry needling;Patient/family education   ? PT Next Visit Plan could try traction, some resisted breathing, needs felxibility   ? Consulted and Agree with Plan of Care Patient   ? ?  ?  ? ?  ? ? ?Patient will benefit from skilled therapeutic intervention in order to improve the following deficits and impairments:  Decreased range of motion, Difficulty walking, Impaired tone, Increased muscle spasms, Pain, Improper body mechanics, Impaired flexibility, Decreased strength, Postural dysfunction, Decreased mobility, Decreased activity tolerance ? ?Visit Diagnosis: ?Other low back pain - Plan: PT plan of care cert/re-cert ? ?Sciatica, left side - Plan: PT plan of care cert/re-cert ? ?Pain in thoracic spine - Plan: PT plan of care cert/re-cert ? ?Muscle spasm of back - Plan: PT plan of care cert/re-cert ? ?Abnormal posture - Plan: PT plan of care cert/re-cert ? ? ? ? ?Problem List ?Patient Active Problem List  ? Diagnosis Date Noted  ? Vertigo 03/29/2021  ? Chest pain 10/27/2020  ? Left lumbar radiculopathy 10/27/2020  ? Insomnia 10/27/2020  ? Grief reaction 08/03/2020  ? Epigastric pain 08/03/2020  ? Travel advice encounter 08/03/2020  ? Encounter for well adult exam with abnormal findings  03/23/2020  ? 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 Non-traumatic mid back pain 03/23/2020  ? IBS (irritable bowel syndrome) 03/23/2020  ? Costochondritis 10/13/2019  ? Anxiety 10/13/2019  ? Urinary frequency 06/19/202

## 2021-05-25 ENCOUNTER — Other Ambulatory Visit (HOSPITAL_BASED_OUTPATIENT_CLINIC_OR_DEPARTMENT_OTHER): Payer: Self-pay | Admitting: Internal Medicine

## 2021-05-25 DIAGNOSIS — Z1231 Encounter for screening mammogram for malignant neoplasm of breast: Secondary | ICD-10-CM

## 2021-06-03 ENCOUNTER — Telehealth: Payer: Self-pay | Admitting: Pulmonary Disease

## 2021-06-03 MED ORDER — AZITHROMYCIN 250 MG PO TABS: ORAL_TABLET | ORAL | 0 refills | Status: DC

## 2021-06-03 MED ORDER — PREDNISONE 20 MG PO TABS: 40.0000 mg | ORAL_TABLET | Freq: Every day | ORAL | 0 refills | Status: DC

## 2021-06-03 NOTE — Telephone Encounter (Signed)
Called and spoke with patient. She stated that she has been coughing more since this past Monday. It has been a productive cough with thick clear phlegm. She has also had an increase in SOB and wheezing. She lost her voice yesterday due to the cough. Denied any fevers or any sick exposures.   She confirmed that she has been using her Symbicort, albuterol and sodium chloride.   Pharmacy is [facility] on [address].   Dr. Vaughan Browner, can you please advise? Thanks!

## 2021-06-03 NOTE — Telephone Encounter (Signed)
Spoke with the pt and notified of response per Dr Vaughan Browner. Pt verbalized understanding. Rxs were sent to pharm. She is aware to call if not improving.

## 2021-06-03 NOTE — Telephone Encounter (Signed)
Order Z pack and prednisone '40mg'$ /day for 5 days

## 2021-06-08 ENCOUNTER — Ambulatory Visit: Payer: Medicare HMO | Admitting: Family Medicine

## 2021-06-10 ENCOUNTER — Ambulatory Visit: Payer: Medicare HMO | Admitting: Physical Therapy

## 2021-06-16 ENCOUNTER — Ambulatory Visit: Payer: Medicare HMO | Admitting: Physical Therapy

## 2021-06-23 ENCOUNTER — Ambulatory Visit: Payer: Medicare HMO | Admitting: Physical Therapy

## 2021-06-30 ENCOUNTER — Ambulatory Visit: Payer: Medicare HMO | Admitting: Physical Therapy

## 2021-07-04 ENCOUNTER — Ambulatory Visit (HOSPITAL_BASED_OUTPATIENT_CLINIC_OR_DEPARTMENT_OTHER)
Admission: RE | Admit: 2021-07-04 | Discharge: 2021-07-04 | Disposition: A | Discharge status: Home / Self Care | Payer: Medicare HMO | Source: Ambulatory Visit | Attending: Internal Medicine | Admitting: Internal Medicine

## 2021-07-04 DIAGNOSIS — Z1231 Encounter for screening mammogram for malignant neoplasm of breast: Secondary | ICD-10-CM | POA: Diagnosis not present

## 2021-07-12 ENCOUNTER — Encounter: Payer: Medicare HMO | Admitting: Internal Medicine

## 2021-07-14 ENCOUNTER — Encounter: Payer: Self-pay | Admitting: Internal Medicine

## 2021-07-14 ENCOUNTER — Ambulatory Visit (INDEPENDENT_AMBULATORY_CARE_PROVIDER_SITE_OTHER): Payer: Medicare HMO | Admitting: Internal Medicine

## 2021-07-14 VITALS — BP 108/62 | HR 87 | Temp 98.8°F | Ht 67.0 in | Wt 158.4 lb

## 2021-07-14 DIAGNOSIS — J309 Allergic rhinitis, unspecified: Secondary | ICD-10-CM

## 2021-07-14 DIAGNOSIS — H6123 Impacted cerumen, bilateral: Secondary | ICD-10-CM | POA: Diagnosis not present

## 2021-07-14 DIAGNOSIS — E559 Vitamin D deficiency, unspecified: Secondary | ICD-10-CM | POA: Diagnosis not present

## 2021-07-14 DIAGNOSIS — R1013 Epigastric pain: Secondary | ICD-10-CM

## 2021-07-14 DIAGNOSIS — H16223 Keratoconjunctivitis sicca, not specified as Sjogren's, bilateral: Secondary | ICD-10-CM | POA: Diagnosis not present

## 2021-07-14 MED ORDER — MELOXICAM 15 MG PO TABS
15.0000 mg | ORAL_TABLET | Freq: Every day | ORAL | 3 refills | Status: DC | PRN
Start: 2021-07-14 — End: 2022-02-16

## 2021-07-14 MED ORDER — METHYLPREDNISOLONE ACETATE 80 MG/ML IJ SUSP
80.0000 mg | Freq: Once | INTRAMUSCULAR | Status: AC
  Administered 2021-07-14: 80 mg via INTRAMUSCULAR

## 2021-07-14 NOTE — Progress Notes (Signed)
Patient ID: Hannah Barron, female   DOB: 1970-05-02, 51 y.o.   MRN: [id number]        Chief Complaint: follow up low vit d, allergies, epigastric pain and bilateral hearing loss       HPI:  Hannah Barron is a 51 y.o. female here overall doing ok, but Does have several wks ongoing nasal allergy symptoms with clearish congestion, itch and sneezing, without fever, pain, ST, cough, swelling or wheezing.  Not taking Vit D.  Also has mod pain to the xyphoid area of the epigastrium, sharp tedner, and worse to bend forward as she sits for work each day.  Also with reduced hearing bilateral ears in the past wk, without HA. Pt denies chest pain, increased sob or doe, wheezing, orthopnea, PND, increased LE swelling, palpitations, dizziness or syncope.   Pt denies polydipsia, polyuria, or new focal neuro s/s.    Pt denies fever, wt loss, night sweats, loss of appetite, or other constitutional symptoms  Denies worsening depressive symptoms, suicidal ideation, or panic; has ongoing anxiety       Wt Readings from Last 3 Encounters:  07/14/21 158 lb 6 oz (71.8 kg)  04/28/21 159 lb (72.1 kg)  04/28/21 159 lb 6.4 oz (72.3 kg)   BP Readings from Last 3 Encounters:  07/14/21 108/62  04/28/21 114/68  04/28/21 130/76         Past Medical History:  Diagnosis Date   Anemia    Bronchiectasis (HCC)    COPD (chronic obstructive pulmonary disease) (HCC)    GERD (gastroesophageal reflux disease)    Hyperlipidemia    Migraines    Paroxysmal SVT (supraventricular tachycardia) ([place])    a. diagnosed in 11/2015.   Right leg numbness    Past Surgical History:  Procedure Laterality Date   CESAREAN SECTION     CYSTOSCOPY N/A 04/05/2016   Procedure: CYSTOSCOPY;  Surgeon: Lavonia Drafts, MD;  Location: [facility];  Service: Gynecology;  Laterality: N/A;   LAPAROSCOPIC VAGINAL HYSTERECTOMY WITH SALPINGECTOMY Bilateral 04/05/2016   Procedure: LAPAROSCOPIC ASSISTED VAGINAL HYSTERECTOMY WITH SALPINGECTOMY;  Surgeon: Lavonia Drafts, MD;  Location: [facility];  Service: Gynecology;  Laterality: Bilateral;   LUNG BIOPSY     VIDEO BRONCHOSCOPY Bilateral 03/29/2016   Procedure: VIDEO BRONCHOSCOPY WITHOUT FLUORO;  Surgeon: Marshell Garfinkel, MD;  Location: [facility];  Service: Cardiopulmonary;  Laterality: Bilateral;    reports that she has never smoked. She has never used smokeless tobacco. She reports that she does not drink alcohol and does not use drugs. family history includes Healthy in her father and mother. No Known Allergies Current Outpatient Medications on File Prior to Visit  Medication Sig Dispense Refill   albuterol (VENTOLIN HFA) 108 (90 Base) MCG/ACT inhaler INHALE 2 PUFFS INTO THE LUNGS EVERY 6 HOURS AS NEEDED FOR WHEEZING OR SHORTNESS OF BREATH 8 g 5   atorvastatin (LIPITOR) 20 MG tablet Take 1 tablet (20 mg total) by mouth daily. 90 tablet 3   azithromycin (ZITHROMAX Z-PAK) 250 MG tablet Take as directed 6 each 0   budesonide-formoterol (SYMBICORT) 160-4.5 MCG/ACT inhaler Inhale 2 puffs into the lungs 2 (two) times daily. 10.2 g 5   Cholecalciferol 50 MCG (2000 UT) TABS 1 tab by mouth once daily 30 tablet 99   clonazePAM (KLONOPIN) 0.5 MG tablet Take 1 tablet (0.5 mg total) by mouth 2 (two) times daily as needed for anxiety. 60 tablet 0   cyclobenzaprine (FLEXERIL) 5 MG tablet Take 1 tablet (5 mg total) by mouth 3 (  three) times daily as needed for muscle spasms. 40 tablet 1   dicyclomine (BENTYL) 20 MG tablet Take 1 tablet (20 mg total) by mouth every 6 (six) hours. 60 tablet 5   estradiol (CLIMARA) 0.05 mg/24hr patch Place 1 patch (0.05 mg total) onto the skin once a week. 4 patch 12   meclizine (ANTIVERT) 12.5 MG tablet Take 1 tablet (12.5 mg total) by mouth 3 (three) times daily as needed for dizziness. 40 tablet 1   metoprolol succinate (TOPROL-XL) 50 MG 24 hr tablet TAKE 1 TABLET IN MORNING & 1/2 TABLET IN EVENING *TAKE 1/2 TAB AS NEEDED FOR PALPITATIONS/RAPID HR* 180 tablet 3   naproxen  (NAPROSYN) 500 MG tablet Take 1 tablet (500 mg total) by mouth 2 (two) times daily with a meal. 60 tablet 3   omeprazole (PRILOSEC) 40 MG capsule Take 1 capsule (40 mg total) by mouth daily. 90 capsule 3   prednisoLONE acetate (PRED FORTE) 1 % ophthalmic suspension      predniSONE (DELTASONE) 20 MG tablet Take 2 tablets (40 mg total) by mouth daily with breakfast. 10 tablet 0   Respiratory Therapy Supplies (FLUTTER) DEVI Use as directed. 1 each 0   RESTASIS 0.05 % ophthalmic emulsion   2   sodium chloride HYPERTONIC 3 % nebulizer solution Take by nebulization in the morning and at bedtime. J47.9 750 mL 11   traMADol (ULTRAM) 50 MG tablet Take 1 tablet (50 mg total) by mouth every 6 (six) hours as needed. 30 tablet 0   traZODone (DESYREL) 50 MG tablet Take 0.5-1 tablets (25-50 mg total) by mouth at bedtime as needed for sleep. 90 tablet 1   No current facility-administered medications on file prior to visit.        ROS:  All others reviewed and negative.  Objective        PE:  BP 108/62   Pulse 87   Temp 98.8 F (37.1 C) (Oral)   Ht '5\' 7"'$  (1.702 m)   Wt 158 lb 6 oz (71.8 kg)   LMP 03/31/2016   SpO2 98%   BMI 24.81 kg/m                 Constitutional: Pt appears in NAD, non toxic               HENT: Head: NCAT.                Right Ear: External ear normal.  Bilateral TMs with wax impactions resolved with irrigation and hearing improved               Left Ear: External ear normal. Bilat tm's with mild erythema.  Max sinus areas non tender.  Pharynx with mild erythema, no exudate               Eyes: . Pupils are equal, round, and reactive to light. Conjunctivae and EOM are normal               Nose: without d/c or deformity               Neck: Neck supple. Gross normal ROM               Cardiovascular: Normal rate and regular rhythm.                 Pulmonary/Chest: Effort normal and breath sounds without rales or wheezing.  Abd:  Soft, NT, ND, + BS, no organomegaly but  tender xyphoid               Neurological: Pt is alert. At baseline orientation, motor grossly intact               Skin: Skin is warm. No rashes, no other new lesions, LE edema - none               Psychiatric: Pt behavior is normal without agitation . 2+ nervous  Micro: none  Cardiac tracings I have personally interpreted today:  none  Pertinent Radiological findings (summarize): none   Lab Results  Component Value Date   WBC 5.9 03/25/2021   HGB 13.4 03/25/2021   HCT 40.6 03/25/2021   PLT 245.0 03/25/2021   GLUCOSE 93 03/25/2021   CHOL 160 03/25/2021   TRIG 67.0 03/25/2021   HDL 70.90 03/25/2021   LDLCALC 76 03/25/2021   ALT 17 03/25/2021   AST 19 03/25/2021   NA 139 03/25/2021   K 3.9 03/25/2021   CL 101 03/25/2021   CREATININE 0.68 03/25/2021   BUN 9 03/25/2021   CO2 29 03/25/2021   TSH 0.76 03/25/2021   INR 1.1 10/27/2014   HGBA1C 6.5 03/25/2021   Assessment/Plan:  Hannah Barron is a 51 y.o. Other or two or more races [6] female with  has a past medical history of Anemia, Bronchiectasis ([place]), COPD (chronic obstructive pulmonary disease) ([place]), GERD (gastroesophageal reflux disease), Hyperlipidemia, Migraines, Paroxysmal SVT (supraventricular tachycardia) ([place]), and Right leg numbness.  Vitamin D deficiency Last vitamin D Lab Results  Component Value Date   VD25OH 27.61 (L) 03/25/2021   Low, to start oral replacement   Cerumen impaction  Bilateral TMs with wax impactions resolved with irrigation and hearing improved  Ceruminosis is noted.  Wax is removed by syringing and manual debridement. Instructions for home care to prevent wax buildup are given.   Allergic rhinitis Mild to mod, for depomedrol I'm 80 mg,  to f/u any worsening symptoms or concerns  Epigastric pain Exam today c/w xyphoid tenderness, for mobic 15 mg prn,  to f/u any worsening symptoms or concerns  Followup: Return in about 6 months (around 01/14/2022).  Cathlean Cower, MD 07/14/2021 7:01  PM [facility]

## 2021-07-14 NOTE — Assessment & Plan Note (Signed)
Mild to mod, for depomedrol I'm 80 mg,  to f/u any worsening symptoms or concerns 

## 2021-07-14 NOTE — Patient Instructions (Signed)
Please take all new medication as prescribed - meloxicam for pain as needed  Please take OTC Vitamin D3 at 2000 units per day, indefinitely  You had the steroid shot today for allergies  Your ears were irrigated of wax today  Please continue all other medications as before, and refills have been done if requested.  Please have the pharmacy call with any other refills you may need.  Please keep your appointments with your specialists as you may have planned  Please make an Appointment to return in 6 months, or sooner if needed

## 2021-07-14 NOTE — Assessment & Plan Note (Signed)
Last vitamin D Lab Results  Component Value Date   VD25OH 27.61 (L) 03/25/2021   Low, to start oral replacement

## 2021-07-14 NOTE — Assessment & Plan Note (Signed)
Exam today c/w xyphoid tenderness, for mobic 15 mg prn,  to f/u any worsening symptoms or concerns

## 2021-07-14 NOTE — Assessment & Plan Note (Signed)
Bilateral TMs with wax impactions resolved with irrigation and hearing improved  Ceruminosis is noted.  Wax is removed by syringing and manual debridement. Instructions for home care to prevent wax buildup are given.

## 2021-07-16 ENCOUNTER — Ambulatory Visit (HOSPITAL_BASED_OUTPATIENT_CLINIC_OR_DEPARTMENT_OTHER): Payer: Medicare HMO | Admitting: Radiology

## 2021-07-19 NOTE — Progress Notes (Deleted)
   I, Wendy Poet, LAT, ATC, am serving as scribe for Dr. Lynne Leader.  Hannah Barron is a 51 y.o. female who presents to [place] [facility] today for f/u of L-sided LBP and L lateral hip/thigh pain likely due to hip impingement.  She was last seen by Dr. Georgina Snell on 04/28/21 and was referred to PT but never completed any visits.  Today, pt reports   Diagnostic testing: L hip XR- 04/28/21; L-spine MRI- 11/22/20  Pertinent review of systems: ***  Relevant historical information: ***   Exam:  LMP 03/31/2016  General: Well Developed, well nourished, and in no acute distress.   MSK: ***    Lab and Radiology Results No results found for this or any previous visit (from the past 72 hour(s)). No results found.     Assessment and Plan: 51 y.o. female with ***   PDMP not reviewed this encounter. No orders of the defined types were placed in this encounter.  No orders of the defined types were placed in this encounter.    Discussed warning signs or symptoms. Please see discharge instructions. Patient expresses understanding.   ***

## 2021-07-20 ENCOUNTER — Ambulatory Visit: Payer: Medicare HMO | Admitting: Family Medicine

## 2021-07-21 ENCOUNTER — Ambulatory Visit (HOSPITAL_COMMUNITY): Payer: Medicare HMO

## 2021-07-31 ENCOUNTER — Other Ambulatory Visit: Payer: Self-pay | Admitting: Internal Medicine

## 2021-07-31 NOTE — Telephone Encounter (Signed)
Please refill as per office routine med refill policy (all routine meds to be refilled for 3 mo or monthly (per pt preference) up to one year from last visit, then month to month grace period for 3 mo, then further med refills will have to be denied) ? ?

## 2021-09-22 ENCOUNTER — Encounter: Payer: Self-pay | Admitting: General Practice

## 2021-09-29 DIAGNOSIS — H16223 Keratoconjunctivitis sicca, not specified as Sjogren's, bilateral: Secondary | ICD-10-CM | POA: Diagnosis not present

## 2021-10-28 ENCOUNTER — Ambulatory Visit (INDEPENDENT_AMBULATORY_CARE_PROVIDER_SITE_OTHER): Payer: Medicare HMO | Admitting: Family Medicine

## 2021-10-28 ENCOUNTER — Encounter: Payer: Self-pay | Admitting: Family Medicine

## 2021-10-28 VITALS — BP 136/86 | HR 77 | Temp 97.8°F | Wt 165.0 lb

## 2021-10-28 DIAGNOSIS — R002 Palpitations: Secondary | ICD-10-CM | POA: Diagnosis not present

## 2021-10-28 DIAGNOSIS — R399 Unspecified symptoms and signs involving the genitourinary system: Secondary | ICD-10-CM | POA: Diagnosis not present

## 2021-10-28 DIAGNOSIS — R109 Unspecified abdominal pain: Secondary | ICD-10-CM | POA: Diagnosis not present

## 2021-10-28 LAB — POCT URINALYSIS DIPSTICK
Bilirubin, UA: 1.005
Blood, UA: NEGATIVE
Glucose, UA: NEGATIVE
Ketones, UA: NEGATIVE
Leukocytes, UA: NEGATIVE
Nitrite, UA: NEGATIVE
Protein, UA: NEGATIVE
Spec Grav, UA: <=1.005 — AB (ref 1.010–1.025)
Urobilinogen, UA: 0.2 E.U./dL
pH, UA: 7.5 (ref 5.0–8.0)

## 2021-10-28 MED ORDER — PHENAZOPYRIDINE HCL 97.2 MG PO TABS: 97.0000 mg | ORAL_TABLET | Freq: Three times a day (TID) | ORAL | 0 refills | Status: AC | PRN

## 2021-10-28 MED ORDER — CEPHALEXIN 500 MG PO CAPS: 500.0000 mg | ORAL_CAPSULE | Freq: Four times a day (QID) | ORAL | 0 refills | Status: AC

## 2021-10-28 MED ORDER — DICYCLOMINE HCL 20 MG PO TABS: 20.0000 mg | ORAL_TABLET | Freq: Four times a day (QID) | ORAL | 5 refills | Status: DC

## 2021-10-28 MED ORDER — METOPROLOL SUCCINATE ER 50 MG PO TB24: ORAL_TABLET | ORAL | 3 refills | Status: DC

## 2021-10-28 NOTE — Progress Notes (Signed)
Assessment/Plan:   Problem List Items Addressed This Visit       Other   Palpitations   Relevant Medications   metoprolol succinate (TOPROL-XL) 50 MG 24 hr tablet   Other Visit Diagnoses     UTI symptoms    -  Primary   Relevant Medications   cephALEXin (KEFLEX) 500 MG capsule   phenazopyridine (PYRIDIUM) 97 MG tablet   Other Relevant Orders   POCT Urinalysis Dipstick (Completed)   Urine Culture   Abdominal cramping       Relevant Medications   dicyclomine (BENTYL) 20 MG tablet     UA negative, however symptoms and presentation during for possible cystitis versus mild pyelonephritis Treat empirically with cephalexin Pyridium for dysuria Urine culture Return and ED precautions discussed     Subjective:  HPI:  Hannah Barron is a 51 y.o. female who has COPD (chronic obstructive pulmonary disease) ([place]); Fatigue; Bronchiectasis without acute exacerbation ([place]); GERD (gastroesophageal reflux disease); Palpitations; Lung nodules; Medicare annual wellness visit, subsequent; Bronchiectasis with acute exacerbation ([place]); Acne vulgaris; Cerumen impaction; Vitamin D deficiency; HLD (hyperlipidemia); Right cervical radiculopathy; Trapezius muscle strain, right, initial encounter; Right carpal tunnel syndrome; Urinary frequency; Costochondritis; Anxiety; Encounter for well adult exam with abnormal findings; Brain cyst; Non-traumatic mid back pain; IBS (irritable bowel syndrome); Grief reaction; Epigastric pain; Travel advice encounter; Chest pain; Left lumbar radiculopathy; Insomnia; Vertigo; and Allergic rhinitis on their problem list..   She  has a past medical history of Anemia, Bronchiectasis ([place]), COPD (chronic obstructive pulmonary disease) ([place]), GERD (gastroesophageal reflux disease), Hyperlipidemia, Migraines, Paroxysmal SVT (supraventricular tachycardia), and Right leg numbness..   She presents with chief complaint of UTI SYMPTOMS (Lower abdominal and left flank pain, urine  smell. Rx refill bentyl and metoprolol) .   Urinary symptoms  She reports new onset cloudy malodorous urine, dysuria, flank pain, urinary frequency, and urinary urgency. The current episode started  4 days ago and is worsening. Patient states symptoms are mild in intensity, occurring intermittently. She  has not been recently treated for similar symptoms.    Associated symptoms: Yespelvic pressure Yes back pain, history of chronic back pain but says feels different  Yes chills, improved with tylenol No constipation  No cramping No diarrhea  No discharge Yes fever, subjective  No hematuria No nausea  No vomiting    --------------------------------------------------------------------------------------- Patient has chronic abdominal cramping.  This is controlled with Bentyl.  Requesting refill  Patient has history of palpitations.  Symptoms controlled on metoprolol.  Requesting refill Past Surgical History:  Procedure Laterality Date   CESAREAN SECTION     CYSTOSCOPY N/A 04/05/2016   Procedure: CYSTOSCOPY;  Surgeon: Lavonia Drafts, MD;  Location: [facility];  Service: Gynecology;  Laterality: N/A;   LAPAROSCOPIC VAGINAL HYSTERECTOMY WITH SALPINGECTOMY Bilateral 04/05/2016   Procedure: LAPAROSCOPIC ASSISTED VAGINAL HYSTERECTOMY WITH SALPINGECTOMY;  Surgeon: Lavonia Drafts, MD;  Location: [facility];  Service: Gynecology;  Laterality: Bilateral;   LUNG BIOPSY     VIDEO BRONCHOSCOPY Bilateral 03/29/2016   Procedure: VIDEO BRONCHOSCOPY WITHOUT FLUORO;  Surgeon: Marshell Garfinkel, MD;  Location: [facility];  Service: Cardiopulmonary;  Laterality: Bilateral;    Outpatient Medications Prior to Visit  Medication Sig Dispense Refill   albuterol (VENTOLIN HFA) 108 (90 Base) MCG/ACT inhaler INHALE 2 PUFFS INTO THE LUNGS EVERY 6 HOURS AS NEEDED FOR WHEEZING OR SHORTNESS OF BREATH 8 g 5   atorvastatin (LIPITOR) 20 MG tablet Take 1 tablet (20 mg total) by mouth daily. 90 tablet 3   azithromycin  (  ZITHROMAX Z-PAK) 250 MG tablet Take as directed 6 each 0   budesonide-formoterol (SYMBICORT) 160-4.5 MCG/ACT inhaler Inhale 2 puffs into the lungs 2 (two) times daily. 10.2 g 5   Cholecalciferol 50 MCG (2000 UT) TABS 1 tab by mouth once daily 30 tablet 99   clonazePAM (KLONOPIN) 0.5 MG tablet Take 1 tablet (0.5 mg total) by mouth 2 (two) times daily as needed for anxiety. 60 tablet 0   cyclobenzaprine (FLEXERIL) 5 MG tablet Take 1 tablet (5 mg total) by mouth 3 (three) times daily as needed for muscle spasms. 40 tablet 1   estradiol (CLIMARA) 0.05 mg/24hr patch Place 1 patch (0.05 mg total) onto the skin once a week. 4 patch 12   meclizine (ANTIVERT) 12.5 MG tablet Take 1 tablet (12.5 mg total) by mouth 3 (three) times daily as needed for dizziness. 40 tablet 1   meloxicam (MOBIC) 15 MG tablet Take 1 tablet (15 mg total) by mouth daily as needed for pain. 90 tablet 3   naproxen (NAPROSYN) 500 MG tablet Take 1 tablet (500 mg total) by mouth 2 (two) times daily with a meal. 60 tablet 3   omeprazole (PRILOSEC) 40 MG capsule Take 1 capsule by mouth once daily 90 capsule 2   prednisoLONE acetate (PRED FORTE) 1 % ophthalmic suspension      predniSONE (DELTASONE) 20 MG tablet Take 2 tablets (40 mg total) by mouth daily with breakfast. 10 tablet 0   Respiratory Therapy Supplies (FLUTTER) DEVI Use as directed. 1 each 0   RESTASIS 0.05 % ophthalmic emulsion   2   sodium chloride HYPERTONIC 3 % nebulizer solution Take by nebulization in the morning and at bedtime. J47.9 750 mL 11   traMADol (ULTRAM) 50 MG tablet Take 1 tablet (50 mg total) by mouth every 6 (six) hours as needed. 30 tablet 0   traZODone (DESYREL) 50 MG tablet Take 0.5-1 tablets (25-50 mg total) by mouth at bedtime as needed for sleep. 90 tablet 1   dicyclomine (BENTYL) 20 MG tablet Take 1 tablet (20 mg total) by mouth every 6 (six) hours. 60 tablet 5   metoprolol succinate (TOPROL-XL) 50 MG 24 hr tablet TAKE 1 TABLET IN MORNING & 1/2 TABLET  IN EVENING *TAKE 1/2 TAB AS NEEDED FOR PALPITATIONS/RAPID HR* 180 tablet 3   No facility-administered medications prior to visit.    Family History  Problem Relation Age of Onset   Healthy Mother    Healthy Father    Colon cancer Neg Hx    Stomach cancer Neg Hx    Rectal cancer Neg Hx    Esophageal cancer Neg Hx     Social History   Socioeconomic History   Marital status: Married    Spouse name: Not on file   Number of children: 1   Years of education: 12   Highest education level: Not on file  Occupational History   Occupation: disability  Tobacco Use   Smoking status: Never   Smokeless tobacco: Never  Vaping Use   Vaping Use: Never used  Substance and Sexual Activity   Alcohol use: No    Alcohol/week: 0.0 standard drinks of alcohol   Drug use: No   Sexual activity: Yes    Birth control/protection: Surgical  Other Topics Concern   Not on file  Social History Narrative   Fun: Watch TV   Social Determinants of Health   Financial Resource Strain: Low Risk  (05/03/2021)   Overall Financial Resource Strain (CARDIA)  Difficulty of Paying Living Expenses: Not very hard  Food Insecurity: No Food Insecurity (05/03/2021)   Hunger Vital Sign    Worried About Running Out of Food in the Last Year: Never true    Ran Out of Food in the Last Year: Never true  Transportation Needs: No Transportation Needs (05/03/2021)   PRAPARE - Hydrologist (Medical): No    Lack of Transportation (Non-Medical): No  Physical Activity: Inactive (05/03/2021)   Exercise Vital Sign    Days of Exercise per Week: 0 days    Minutes of Exercise per Session: 0 min  Stress: No Stress Concern Present (05/03/2021)   [place]    Feeling of Stress : Not at all  Social Connections: Moderately Integrated (05/03/2021)   Social Connection and Isolation Panel [NHANES]    Frequency of Communication with Friends and  Family: More than three times a week    Frequency of Social Gatherings with Friends and Family: Once a week    Attends Religious Services: 1 to 4 times per year    Active Member of [employer] or Organizations: No    Attends Archivist Meetings: 1 to 4 times per year    Marital Status: Separated  Intimate Partner Violence: Not At Risk (05/03/2021)   Humiliation, Afraid, Rape, and Kick questionnaire    Fear of Current or Ex-Partner: No    Emotionally Abused: No    Physically Abused: No    Sexually Abused: No                                                                                                 Objective:  Physical Exam: BP 136/86 (BP Location: Left Arm, Patient Position: Sitting, Cuff Size: Large)   Pulse 77   Temp 97.8 F (36.6 C) (Temporal)   Wt 165 lb (74.8 kg)   LMP 03/31/2016   SpO2 98%   BMI 25.84 kg/m    General: No acute distress. Awake and conversant.  Eyes: Normal conjunctiva, anicteric. Round symmetric pupils.  ENT: Hearing grossly intact. No nasal discharge.  Neck: Neck is supple. No masses or thyromegaly.  Respiratory: Respirations are non-labored. No auditory wheezing.  Skin: Warm. No rashes or ulcers.  Psych: Alert and oriented. Cooperative, Appropriate mood and affect, Normal judgment.  CV: No cyanosis or JVD ABD.  Mild suprabupic tenderness, no rebound or guarding GU: Mild left CVA tenderness MSK: Normal ambulation. No clubbing  Neuro: Sensation and CN II-XII grossly normal.        Alesia Banda, MD, MS

## 2021-10-28 NOTE — Patient Instructions (Signed)
For possible UTI, we are prescribing cephalexin and AZO. Come back if no improvement, go to ED if severe pain, fevers, or other worrisome symptoms  We refilled your bentyl and metoprolol.

## 2021-11-04 ENCOUNTER — Other Ambulatory Visit: Payer: Self-pay | Admitting: Internal Medicine

## 2021-11-04 NOTE — Telephone Encounter (Signed)
Please refill as per office routine med refill policy (all routine meds to be refilled for 3 mo or monthly (per pt preference) up to one year from last visit, then month to month grace period for 3 mo, then further med refills will have to be denied) ? ?

## 2021-12-16 ENCOUNTER — Ambulatory Visit (INDEPENDENT_AMBULATORY_CARE_PROVIDER_SITE_OTHER): Payer: Medicare HMO | Admitting: Pulmonary Disease

## 2021-12-16 ENCOUNTER — Encounter: Payer: Self-pay | Admitting: Pulmonary Disease

## 2021-12-16 VITALS — BP 110/58 | HR 77 | Temp 98.0°F | Ht 67.0 in | Wt 167.0 lb

## 2021-12-16 DIAGNOSIS — J479 Bronchiectasis, uncomplicated: Secondary | ICD-10-CM

## 2021-12-16 MED ORDER — AZITHROMYCIN 250 MG PO TABS: ORAL_TABLET | ORAL | 0 refills | Status: DC

## 2021-12-16 NOTE — Progress Notes (Signed)
Hannah Barron    [id number]    1970/02/05  Primary Care Physician:John, Hunt Oris, MD  Referring Physician: Biagio Borg, MD [address] [place],  [place] [address]  Chief complaint:  Follow up for  Cystic bronchiectasis  HPI: 51 year old with chronic obstructive lung disease, bronchiectasis. She was previously followed at [place] with a VATS biopsy in 2003 and was told she had cystic bronchiectasis. She had followed up with a pulmonologist at [facility] but has moved to [place] on 2017. We have been unable to get records from [place] in spite of multiple attempts.  She is an immigrant from [place] in 1998 and does not report any exposure to tuberculosis. She does not recall getting a BCG vaccine as a child. Underwent a bronchoscopy to evaluate for MAI infection and a positive quantiferon as she was unable to give a good sputum sample. All results are negative except for positive PJP on DFA. She underwent further evaluation with negative HIV and beta glucan tests.  The PJP test was thought to be false positive She had a hysterectomy for uterine fibroids  On 3/27  Last bronchiectasis exacerbation was in July 2020 which was treated with Z-Pak and prednisone She has separated from her husband in 2021 and reports significant improvement in stress and breathing issues since then. She is back to work now full-time with [employer]    Interim history: Complains of increased fatigue, cough with yellow mucus, mild increase in dyspnea. No fevers or chills.  Outpatient Encounter Medications as of 12/16/2021  Medication Sig   albuterol (VENTOLIN HFA) 108 (90 Base) MCG/ACT inhaler INHALE 2 PUFFS INTO THE LUNGS EVERY 6 HOURS AS NEEDED FOR WHEEZING OR SHORTNESS OF BREATH   atorvastatin (LIPITOR) 20 MG tablet Take 1 tablet by mouth once daily   azithromycin (ZITHROMAX Z-PAK) 250 MG tablet Take as directed   budesonide-formoterol (SYMBICORT) 160-4.5 MCG/ACT inhaler Inhale 2 puffs into  the lungs 2 (two) times daily.   Cholecalciferol 50 MCG (2000 UT) TABS 1 tab by mouth once daily   clonazePAM (KLONOPIN) 0.5 MG tablet Take 1 tablet (0.5 mg total) by mouth 2 (two) times daily as needed for anxiety.   cyclobenzaprine (FLEXERIL) 5 MG tablet Take 1 tablet (5 mg total) by mouth 3 (three) times daily as needed for muscle spasms.   dicyclomine (BENTYL) 20 MG tablet Take 1 tablet (20 mg total) by mouth every 6 (six) hours.   estradiol (CLIMARA) 0.05 mg/24hr patch Place 1 patch (0.05 mg total) onto the skin once a week.   meclizine (ANTIVERT) 12.5 MG tablet Take 1 tablet (12.5 mg total) by mouth 3 (three) times daily as needed for dizziness.   meloxicam (MOBIC) 15 MG tablet Take 1 tablet (15 mg total) by mouth daily as needed for pain.   metoprolol succinate (TOPROL-XL) 50 MG 24 hr tablet Take with or immediately following a meal.   naproxen (NAPROSYN) 500 MG tablet Take 1 tablet (500 mg total) by mouth 2 (two) times daily with a meal.   omeprazole (PRILOSEC) 40 MG capsule Take 1 capsule by mouth once daily   phenazopyridine (PYRIDIUM) 97 MG tablet Take 1 tablet (97 mg total) by mouth 3 (three) times daily as needed for pain.   prednisoLONE acetate (PRED FORTE) 1 % ophthalmic suspension    predniSONE (DELTASONE) 20 MG tablet Take 2 tablets (40 mg total) by mouth daily with breakfast.   Respiratory Therapy Supplies (FLUTTER) DEVI Use  as directed.   RESTASIS 0.05 % ophthalmic emulsion    sodium chloride HYPERTONIC 3 % nebulizer solution Take by nebulization in the morning and at bedtime. J47.9   traMADol (ULTRAM) 50 MG tablet Take 1 tablet (50 mg total) by mouth every 6 (six) hours as needed.   traZODone (DESYREL) 50 MG tablet Take 0.5-1 tablets (25-50 mg total) by mouth at bedtime as needed for sleep.   No facility-administered encounter medications on file as of 12/16/2021.   Physical Exam: Blood pressure (!) 110/58, pulse 77, temperature 98 F (36.7 C), temperature source Oral,  height '5\' 7"'$  (1.702 m), weight 167 lb (75.8 kg), last menstrual period 03/31/2016, SpO2 99 %. Gen:      No acute distress HEENT:  EOMI, sclera anicteric Neck:     No masses; no thyromegaly Lungs:    Clear to auscultation bilaterally; normal respiratory effort CV:         Regular rate and rhythm; no murmurs Abd:      + bowel sounds; soft, non-tender; no palpable masses, no distension Ext:    No edema; adequate peripheral perfusion Skin:      Warm and dry; no rash Neuro: alert and oriented x 3 Psych: normal mood and affect   Data Reviewed: Imaging CT high resolution 07/16/15-areas of cystic and varicose bronchiectasis, extensive air trapping, multiple pulmonary nodules. CT chest 03/21/16-areas of bronchiectasis, air trapping and pulmonary nodules. Unchanged compared to 2017 CT angiogram 12/23/16-stable areas of bronchiectasis, air trapping and pulmonary nodules.  No pulmonary embolism. CT chest 05/14/2020-mild progression of right middle lobe bronchiectasis.  Stable pulmonary nodules I have reviewed the images personally. 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 Discussed INH therapy but defer as per pt wishes CF panel 03/25/15- negative for 97 mutations analyzed (report scanned)  Cardiac Myocardial perfusion study 12/2015 >neg  Ischemia , EF 55% Echo 10/2015 >EF 65%.  Echo 11/2018 > EF 65%, normal valves, no wall motion abnormalities   Bronchoscopy 03/29/16 Cultures, AFB, fungal cultures-negative to date PJP DFA-positive Cytology-no malignant cells, CD4: CD8 ratio-1.62 Cell count WBC-26, 26% lymphs,  50% neutrophils, 24% monocyte macrophage  Assessment:  Cystic bronchiectasis She's had workup which is negative for alpha-1 antitrypsin, immunoglobulin deficiency, autoimmune disease, cystic fibrosis and mycobacterial infections.    Prescribe Z-Pak for mild exacerbation.  Does not need prednisone as there is no wheezing on examination. Continues on Symbicort, albuterol as needed. Mucociliary clearance with percussion vest, flutter valve.  Can use Mucinex over-the-counter as needed CT from earlier 2022 reviewed with mild progression in the right middle lobe but clinically she has been stable  Hypertonic saline nebs were ordered at last visit but she never got the delivery.  Will need to reorder  Plan/Recommendations: - Continue symbicort, albuterol - Continue Flutter valve,  Mucinex, percussion vest - Hypertonic saline nebs - Z pack  Marshell Garfinkel MD [facility] 12/16/2021, 4:21 PM   CC: Biagio Borg, MD

## 2021-12-16 NOTE — Patient Instructions (Signed)
We will call in a z pack Schedule high res CT in 6 months. Follow up after CT in 6 months

## 2021-12-21 DIAGNOSIS — J479 Bronchiectasis, uncomplicated: Secondary | ICD-10-CM | POA: Diagnosis not present

## 2021-12-22 ENCOUNTER — Telehealth: Payer: Self-pay | Admitting: Pulmonary Disease

## 2021-12-22 MED ORDER — PREDNISONE 10 MG PO TABS: ORAL_TABLET | ORAL | 0 refills | Status: DC

## 2021-12-22 NOTE — Telephone Encounter (Signed)
Called patient but she did not answer. Left message for her to call us back.  

## 2021-12-22 NOTE — Telephone Encounter (Signed)
Called and spoke with patient. She verbalized understanding. She will call back for an appt as she was at work when I called her. RX has been sent to her pharmacy.   Nothing further needed at time of call.

## 2021-12-22 NOTE — Telephone Encounter (Signed)
Called and spoke with patient. She stated that she has finished the zpak that Dr. Vaughan Browner prescribed on 12/7. She is not feeling any better. She has been experiencing some chest tightness as well as wheezing. She has productive cough with clear phlegm. She denied having any fevers but has felt warm and clammy during the night. Denied any recent sick exposures. Recent covid test was negative.   Confirmed that she is still using her Symbicort, albuterol and chest vest.   She wanted to know if something else could be called in for her since the zpak did not work.   Pharm[facility] [address].  Judson Roch, can you please advise since Dr. Vaughan Browner is not available today? Thanks!

## 2021-12-22 NOTE — Telephone Encounter (Signed)
Shortness of breath. Got a Triage CB but missed it earlier.Pls CB at [phone]

## 2022-01-22 ENCOUNTER — Encounter (HOSPITAL_BASED_OUTPATIENT_CLINIC_OR_DEPARTMENT_OTHER): Payer: Self-pay

## 2022-01-22 ENCOUNTER — Emergency Department (HOSPITAL_BASED_OUTPATIENT_CLINIC_OR_DEPARTMENT_OTHER)
Admission: EM | Admit: 2022-01-22 | Discharge: 2022-01-22 | Disposition: A | Discharge status: Home / Self Care | Payer: Medicare HMO | Attending: Emergency Medicine | Admitting: Emergency Medicine

## 2022-01-22 ENCOUNTER — Emergency Department (HOSPITAL_BASED_OUTPATIENT_CLINIC_OR_DEPARTMENT_OTHER): Payer: Medicare HMO | Admitting: Radiology

## 2022-01-22 DIAGNOSIS — M79601 Pain in right arm: Secondary | ICD-10-CM | POA: Diagnosis not present

## 2022-01-22 DIAGNOSIS — M25511 Pain in right shoulder: Secondary | ICD-10-CM | POA: Diagnosis not present

## 2022-01-22 MED ORDER — DICLOFENAC SODIUM 75 MG PO TBEC: 75.0000 mg | DELAYED_RELEASE_TABLET | Freq: Once | ORAL | Status: DC

## 2022-01-22 MED ORDER — NAPROXEN 250 MG PO TABS
500.0000 mg | ORAL_TABLET | Freq: Once | ORAL | Status: AC
  Administered 2022-01-22: 500 mg via ORAL
  Filled 2022-01-22: qty 2

## 2022-01-22 MED ORDER — DICLOFENAC SODIUM 75 MG PO TBEC: 75.0000 mg | DELAYED_RELEASE_TABLET | Freq: Two times a day (BID) | ORAL | 0 refills | Status: DC

## 2022-01-22 NOTE — ED Notes (Signed)
Ice Pack given. Peanut Crackers and ginger ale given at d/c home.

## 2022-01-22 NOTE — ED Provider Notes (Signed)
[facility] EMERGENCY DEPT Provider Note   CSN: [id number] Arrival date & time: 01/22/22  1706     History  Chief Complaint  Patient presents with   Arm Pain    Hannah Barron is a 52 y.o. female.  Pt reports she has had discomfort in her right arm for the past week.  Pt reports she noticed the pain after sleeping on her arm.  Pt reports numbness in her arm.  Pt reports some discomfort with moving her arm.   The history is provided by the patient. No language interpreter was used.  Arm Pain This is a new problem. The problem occurs constantly. The problem has been gradually worsening. Nothing aggravates the symptoms. Nothing relieves the symptoms. She has tried nothing for the symptoms. The treatment provided no relief.       Home Medications Prior to Admission medications   Medication Sig Start Date End Date Taking? Authorizing Provider  albuterol (VENTOLIN HFA) 108 (90 Base) MCG/ACT inhaler INHALE 2 PUFFS INTO THE LUNGS EVERY 6 HOURS AS NEEDED FOR WHEEZING OR SHORTNESS OF BREATH 04/28/21   Mannam, Hart Robinsons, MD  atorvastatin (LIPITOR) 20 MG tablet Take 1 tablet by mouth once daily 11/09/21   Biagio Borg, MD  azithromycin (ZITHROMAX) 250 MG tablet Take as directed 12/16/21   Marshell Garfinkel, MD  budesonide-formoterol (SYMBICORT) 160-4.5 MCG/ACT inhaler Inhale 2 puffs into the lungs 2 (two) times daily. 04/28/21   Marshell Garfinkel, MD  Cholecalciferol 50 MCG (2000 UT) TABS 1 tab by mouth once daily 03/25/21   Biagio Borg, MD  clonazePAM (KLONOPIN) 0.5 MG tablet Take 1 tablet (0.5 mg total) by mouth 2 (two) times daily as needed for anxiety. 08/03/20   Biagio Borg, MD  cyclobenzaprine (FLEXERIL) 5 MG tablet Take 1 tablet (5 mg total) by mouth 3 (three) times daily as needed for muscle spasms. 03/25/21   Biagio Borg, MD  dicyclomine (BENTYL) 20 MG tablet Take 1 tablet (20 mg total) by mouth every 6 (six) hours. 10/28/21   Bonnita Hollow, MD  estradiol (CLIMARA) 0.05  mg/24hr patch Place 1 patch (0.05 mg total) onto the skin once a week. 11/25/20   Lavonia Drafts, MD  meclizine (ANTIVERT) 12.5 MG tablet Take 1 tablet (12.5 mg total) by mouth 3 (three) times daily as needed for dizziness. 03/25/21 03/25/22  Biagio Borg, MD  meloxicam (MOBIC) 15 MG tablet Take 1 tablet (15 mg total) by mouth daily as needed for pain. 07/14/21   Biagio Borg, MD  metoprolol succinate (TOPROL-XL) 50 MG 24 hr tablet Take with or immediately following a meal. 10/28/21   Bonnita Hollow, MD  naproxen (NAPROSYN) 500 MG tablet Take 1 tablet (500 mg total) by mouth 2 (two) times daily with a meal. 08/03/20   Biagio Borg, MD  omeprazole (PRILOSEC) 40 MG capsule Take 1 capsule by mouth once daily 08/02/21   Biagio Borg, MD  phenazopyridine (PYRIDIUM) 97 MG tablet Take 1 tablet (97 mg total) by mouth 3 (three) times daily as needed for pain. 10/28/21   Bonnita Hollow, MD  prednisoLONE acetate (PRED FORTE) 1 % ophthalmic suspension  03/09/20   [provider]  predniSONE (DELTASONE) 10 MG tablet Take 4 tabs for 2 days, then 3 tabs for 2 days, 2 tabs for 2 days, then 1 tab for 2 days, then stop. 12/22/21   Magdalen Spatz, NP  Respiratory Therapy Supplies (FLUTTER) DEVI Use as directed. 03/16/16  Parrett, Tammy S, NP  RESTASIS 0.05 % ophthalmic emulsion  04/25/15   [provider]  sodium chloride HYPERTONIC 3 % nebulizer solution Take by nebulization in the morning and at bedtime. J47.9 04/28/21   Mannam, Hart Robinsons, MD  traMADol (ULTRAM) 50 MG tablet Take 1 tablet (50 mg total) by mouth every 6 (six) hours as needed. 10/27/20   Biagio Borg, MD  traZODone (DESYREL) 50 MG tablet Take 0.5-1 tablets (25-50 mg total) by mouth at bedtime as needed for sleep. 10/27/20   Biagio Borg, MD      Allergies    Patient has no known allergies.    Review of Systems   Review of Systems  All other systems reviewed and are negative.   Physical Exam Updated Vital Signs BP  126/77 (BP Location: Left Arm)   Pulse 86   Temp 98.1 F (36.7 C) (Oral)   Resp 18   Ht '5\' 7"'$  (1.702 m)   Wt 75.8 kg   LMP 03/31/2016   SpO2 99%   BMI 26.17 kg/m  Physical Exam Vitals and nursing note reviewed.  Constitutional:      Appearance: She is well-developed.  HENT:     Head: Normocephalic.  Cardiovascular:     Rate and Rhythm: Normal rate.  Pulmonary:     Effort: Pulmonary effort is normal.  Abdominal:     General: There is no distension.  Musculoskeletal:        General: Tenderness present. No swelling. Normal range of motion.     Cervical back: Normal range of motion.     Comments: Tender right arm, diffusely  good cap refill,  tender right shoulder to palpation,    Skin:    General: Skin is warm.  Neurological:     General: No focal deficit present.     Mental Status: She is alert and oriented to person, place, and time.  Psychiatric:        Mood and Affect: Mood normal.     ED Results / Procedures / Treatments   Labs (all labs ordered are listed, but only abnormal results are displayed) Labs Reviewed - No data to display  EKG None  Radiology DG Shoulder Right  Result Date: 01/22/2022 CLINICAL DATA:  Radiating right shoulder pain EXAM: RIGHT SHOULDER - 2+ VIEW COMPARISON:  None Available. FINDINGS: There is no evidence of fracture or dislocation. There is no evidence of arthropathy or other focal bone abnormality. Soft tissues are unremarkable. IMPRESSION: No acute osseous finding in the right shoulder. Electronically Signed   By: Audie Pinto M.D.   On: 01/22/2022 19:42    Procedures Procedures    Medications Ordered in ED Medications - No data to display  ED Course/ Medical Decision Making/ A&P                             Medical Decision Making Pt reports discomfort in her right arm after sleeping on her arm.    Amount and/or Complexity of Data Reviewed Radiology: ordered and independent interpretation performed. Decision-making  details documented in ED Course.    Details: Xray  right shoulder  no acute abnormality  ECG/medicine tests: ordered and independent interpretation performed. Decision-making details documented in ED Course.    Details: EKg shows no acute abnormality    Risk Prescription drug management. Risk Details: Pt counseled on results. Pt most likely has symptoms from radial nerve compression.  I will treat  with voltaren.  Pt has seen Dr. Georgina Snell in the past.  I advised pt to see him for recheck this week            Final Clinical Impression(s) / ED Diagnoses Final diagnoses:  Right arm pain    Rx / DC Orders ED Discharge Orders          Ordered    diclofenac (VOLTAREN) 75 MG EC tablet  2 times daily        01/22/22 2020           An After Visit Summary was printed and given to the patient.    Fransico Meadow, Hershal Coria 01/22/22 2020    Tretha Sciara, MD 01/23/22 [phone]

## 2022-01-22 NOTE — ED Notes (Signed)
Pt c/o of pain at d/c home. Spoke with PA and orders received.

## 2022-01-22 NOTE — ED Triage Notes (Signed)
Patient here POV from Home.  Endorses Pain to Right Humerus that radiates to Right Hand for approximately 1 week. Worsening since it began. No Acute Trauma or Injury.   NAD Noted during Triage. A&Ox4. GCS 15. Ambulatory.

## 2022-01-26 ENCOUNTER — Ambulatory Visit: Payer: Medicare HMO | Admitting: Obstetrics & Gynecology

## 2022-02-16 ENCOUNTER — Telehealth (INDEPENDENT_AMBULATORY_CARE_PROVIDER_SITE_OTHER): Payer: Medicare HMO | Admitting: Family Medicine

## 2022-02-16 ENCOUNTER — Encounter: Payer: Self-pay | Admitting: Family Medicine

## 2022-02-16 DIAGNOSIS — M79601 Pain in right arm: Secondary | ICD-10-CM

## 2022-02-16 MED ORDER — TRAMADOL HCL 50 MG PO TABS: 50.0000 mg | ORAL_TABLET | Freq: Three times a day (TID) | ORAL | 0 refills | Status: AC | PRN

## 2022-02-16 NOTE — Progress Notes (Signed)
MyChart Video Visit    Virtual Visit via Video Note   This visit type was conducted due to national recommendations for restrictions regarding the COVID-19 Pandemic (e.g. social distancing) in an effort to limit this patient's exposure and mitigate transmission in our community. This patient is at least at moderate risk for complications without adequate follow up. This format is felt to be most appropriate for this patient at this time. Physical exam was limited by quality of the video and audio technology used for the visit. CMA was able to get the patient set up on a video visit.  Patient location: Home. Patient and provider in visit Provider location: Office  I discussed the limitations of evaluation and management by telemedicine and the availability of in person appointments. The patient expressed understanding and agreed to proceed.  Visit Date: 02/16/2022  Today's healthcare provider: Harland Dingwall, NP-C     Subjective:    Patient ID: Hannah Barron, female    DOB: 1970/02/19, 52 y.o.   MRN: [id number]  Chief Complaint  Patient presents with   Arm Pain    Right arm pain going on for a long time, went to ED. Voltaren did not help. Could not lift hand, pain goes down all the way to finger    HPI  Right arm pain-chronic. states she has tried Aleve and Tylenol and no relief.  States she took oral diclofenac without any relief.  No numbness or tingling. Questionable right hand weakness but she is not dropping things.  No injury.   Recent visit to the ED for the same.  Negative X ray.  She is advised to follow-up with sports medicine and has not yet.  No fever, chills, dizziness, chest pain, shortness of breath, nausea, vomiting.      Past Medical History:  Diagnosis Date   Anemia    Bronchiectasis (HCC)    COPD (chronic obstructive pulmonary disease) (HCC)    GERD (gastroesophageal reflux disease)    Hyperlipidemia    Migraines    Paroxysmal SVT  (supraventricular tachycardia)    a. diagnosed in 11/2015.   Right leg numbness     Past Surgical History:  Procedure Laterality Date   CESAREAN SECTION     CYSTOSCOPY N/A 04/05/2016   Procedure: CYSTOSCOPY;  Surgeon: Lavonia Drafts, MD;  Location: [facility];  Service: Gynecology;  Laterality: N/A;   LAPAROSCOPIC VAGINAL HYSTERECTOMY WITH SALPINGECTOMY Bilateral 04/05/2016   Procedure: LAPAROSCOPIC ASSISTED VAGINAL HYSTERECTOMY WITH SALPINGECTOMY;  Surgeon: Lavonia Drafts, MD;  Location: [facility];  Service: Gynecology;  Laterality: Bilateral;   LUNG BIOPSY     VIDEO BRONCHOSCOPY Bilateral 03/29/2016   Procedure: VIDEO BRONCHOSCOPY WITHOUT FLUORO;  Surgeon: Marshell Garfinkel, MD;  Location: [facility];  Service: Cardiopulmonary;  Laterality: Bilateral;    Family History  Problem Relation Age of Onset   Healthy Mother    Healthy Father    Colon cancer Neg Hx    Stomach cancer Neg Hx    Rectal cancer Neg Hx    Esophageal cancer Neg Hx     Social History   Socioeconomic History   Marital status: Married    Spouse name: Not on file   Number of children: 1   Years of education: 12   Highest education level: Not on file  Occupational History   Occupation: disability  Tobacco Use   Smoking status: Never   Smokeless tobacco: Never  Vaping Use   Vaping Use: Never used  Substance and Sexual Activity  Alcohol use: No    Alcohol/week: 0.0 standard drinks of alcohol   Drug use: No   Sexual activity: Yes    Birth control/protection: Surgical  Other Topics Concern   Not on file  Social History Narrative   Fun: Watch TV   Social Determinants of Health   Financial Resource Strain: Low Risk  (05/03/2021)   Overall Financial Resource Strain (CARDIA)    Difficulty of Paying Living Expenses: Not very hard  Food Insecurity: No Food Insecurity (05/03/2021)   Hunger Vital Sign    Worried About Running Out of Food in the Last Year: Never true    Ran Out of Food in the Last  Year: Never true  Transportation Needs: No Transportation Needs (05/03/2021)   PRAPARE - Hydrologist (Medical): No    Lack of Transportation (Non-Medical): No  Physical Activity: Inactive (05/03/2021)   Exercise Vital Sign    Days of Exercise per Week: 0 days    Minutes of Exercise per Session: 0 min  Stress: No Stress Concern Present (05/03/2021)   [place]    Feeling of Stress : Not at all  Social Connections: Moderately Integrated (05/03/2021)   Social Connection and Isolation Panel [NHANES]    Frequency of Communication with Friends and Family: More than three times a week    Frequency of Social Gatherings with Friends and Family: Once a week    Attends Religious Services: 1 to 4 times per year    Active Member of [employer] or Organizations: No    Attends Archivist Meetings: 1 to 4 times per year    Marital Status: Separated  Intimate Partner Violence: Not At Risk (05/03/2021)   Humiliation, Afraid, Rape, and Kick questionnaire    Fear of Current or Ex-Partner: No    Emotionally Abused: No    Physically Abused: No    Sexually Abused: No    Outpatient Medications Prior to Visit  Medication Sig Dispense Refill   albuterol (VENTOLIN HFA) 108 (90 Base) MCG/ACT inhaler INHALE 2 PUFFS INTO THE LUNGS EVERY 6 HOURS AS NEEDED FOR WHEEZING OR SHORTNESS OF BREATH 8 g 5   atorvastatin (LIPITOR) 20 MG tablet Take 1 tablet by mouth once daily 90 tablet 0   budesonide-formoterol (SYMBICORT) 160-4.5 MCG/ACT inhaler Inhale 2 puffs into the lungs 2 (two) times daily. 10.2 g 5   dicyclomine (BENTYL) 20 MG tablet Take 1 tablet (20 mg total) by mouth every 6 (six) hours. 60 tablet 5   estradiol (CLIMARA) 0.05 mg/24hr patch Place 1 patch (0.05 mg total) onto the skin once a week. 4 patch 12   meclizine (ANTIVERT) 12.5 MG tablet Take 1 tablet (12.5 mg total) by mouth 3 (three) times daily as needed for  dizziness. 40 tablet 1   Cholecalciferol 50 MCG (2000 UT) TABS 1 tab by mouth once daily 30 tablet 99   clonazePAM (KLONOPIN) 0.5 MG tablet Take 1 tablet (0.5 mg total) by mouth 2 (two) times daily as needed for anxiety. (Patient not taking: Reported on 02/16/2022) 60 tablet 0   metoprolol succinate (TOPROL-XL) 50 MG 24 hr tablet Take with or immediately following a meal. (Patient not taking: Reported on 02/16/2022) 180 tablet 3   naproxen (NAPROSYN) 500 MG tablet Take 1 tablet (500 mg total) by mouth 2 (two) times daily with a meal. 60 tablet 3   omeprazole (PRILOSEC) 40 MG capsule Take 1 capsule by mouth once daily  90 capsule 2   phenazopyridine (PYRIDIUM) 97 MG tablet Take 1 tablet (97 mg total) by mouth 3 (three) times daily as needed for pain. 10 tablet 0   prednisoLONE acetate (PRED FORTE) 1 % ophthalmic suspension      Respiratory Therapy Supplies (FLUTTER) DEVI Use as directed. 1 each 0   RESTASIS 0.05 % ophthalmic emulsion   2   sodium chloride HYPERTONIC 3 % nebulizer solution Take by nebulization in the morning and at bedtime. J47.9 750 mL 11   traZODone (DESYREL) 50 MG tablet Take 0.5-1 tablets (25-50 mg total) by mouth at bedtime as needed for sleep. (Patient not taking: Reported on 02/16/2022) 90 tablet 1   azithromycin (ZITHROMAX) 250 MG tablet Take as directed (Patient not taking: Reported on 02/16/2022) 6 tablet 0   cyclobenzaprine (FLEXERIL) 5 MG tablet Take 1 tablet (5 mg total) by mouth 3 (three) times daily as needed for muscle spasms. (Patient not taking: Reported on 02/16/2022) 40 tablet 1   diclofenac (VOLTAREN) 75 MG EC tablet Take 1 tablet (75 mg total) by mouth 2 (two) times daily. (Patient not taking: Reported on 02/16/2022) 20 tablet 0   meloxicam (MOBIC) 15 MG tablet Take 1 tablet (15 mg total) by mouth daily as needed for pain. (Patient not taking: Reported on 02/16/2022) 90 tablet 3   predniSONE (DELTASONE) 10 MG tablet Take 4 tabs for 2 days, then 3 tabs for 2 days, 2 tabs for 2  days, then 1 tab for 2 days, then stop. (Patient not taking: Reported on 02/16/2022) 20 tablet 0   traMADol (ULTRAM) 50 MG tablet Take 1 tablet (50 mg total) by mouth every 6 (six) hours as needed. (Patient not taking: Reported on 02/16/2022) 30 tablet 0   No facility-administered medications prior to visit.    No Known Allergies  ROS     Objective:    Physical Exam  LMP 03/31/2016  Wt Readings from Last 3 Encounters:  01/22/22 167 lb 1.7 oz (75.8 kg)  12/16/21 167 lb (75.8 kg)  10/28/21 165 lb (74.8 kg)   Alert and oriented and in no acute distress.  Respirations unlabored.  She is moving right arm and hand in all directions without difficulty.    Assessment & Plan:   Problem List Items Addressed This Visit   None Visit Diagnoses     Right arm pain    -  Primary   Relevant Medications   traMADol (ULTRAM) 50 MG tablet      No red flag symptoms. Reviewed ED note and results from 01/22/2022 related to right arm pain.  Reports needing pain medication.  She has taken tramadol in the past.  States Aleve, Tylenol and diclofenac have not been helpful.  She was advised to follow-up with Dr. Georgina Snell but has not yet scheduled an appointment.  Advised her to call and schedule an appointment.  I have discontinued Roann Linthicum's traMADol, cyclobenzaprine, meloxicam, azithromycin, predniSONE, and diclofenac. I am also having her start on traMADol. Additionally, I am having her maintain her Restasis, Flutter, prednisoLONE acetate, clonazePAM, naproxen, traZODone, estradiol, meclizine, Cholecalciferol, albuterol, budesonide-formoterol, sodium chloride HYPERTONIC, omeprazole, dicyclomine, metoprolol succinate, phenazopyridine, and atorvastatin.  Meds ordered this encounter  Medications   traMADol (ULTRAM) 50 MG tablet    Sig: Take 1 tablet (50 mg total) by mouth every 8 (eight) hours as needed for up to 3 days.    Dispense:  9 tablet    Refill:  0    Order Specific Question:  Supervising  Provider    Answer:   Pricilla Holm A [[id number]]    I discussed the assessment and treatment plan with the patient. The patient was provided an opportunity to ask questions and all were answered. The patient agreed with the plan and demonstrated an understanding of the instructions.   The patient was advised to call back or seek an in-person evaluation if the symptoms worsen or if the condition fails to improve as anticipated.  I provided 18 minutes of face-to-face time during this encounter.   Harland Dingwall, NP-C [facility] [place] [phone] (phone) [phone] (fax)  [place]

## 2022-02-17 ENCOUNTER — Ambulatory Visit (INDEPENDENT_AMBULATORY_CARE_PROVIDER_SITE_OTHER): Payer: Medicare HMO | Admitting: Family Medicine

## 2022-02-17 ENCOUNTER — Ambulatory Visit: Payer: Self-pay

## 2022-02-17 VITALS — BP 128/76 | HR 84 | Ht 67.0 in | Wt 168.0 lb

## 2022-02-17 DIAGNOSIS — M25511 Pain in right shoulder: Secondary | ICD-10-CM | POA: Diagnosis not present

## 2022-02-17 NOTE — Patient Instructions (Addendum)
Thank you for coming in today.   You received an injection today. Seek immediate medical attention if the joint becomes red, extremely painful, or is oozing fluid.   I've referred you to Physical Therapy.  Let us know if you don't hear from them in one week.   Recheck in 6 weeks.

## 2022-02-17 NOTE — Progress Notes (Signed)
I, [name], LAT, ATC acting as a scribe for Lynne Leader, MD.  Hannah Barron is a 52 y.o. female who presents to [facility] today for RIGHT arm pain. Pt locates pain to the mid-upper arm w/ radiating pain intermittently to R forearm, R hand, and sometimes even into R shoulder and R trapz. Pt is L-hand dominate.  Neck pain: yes- R Numbness/tingling: yes- R hand, mostly 2nd-3rd fingers Onset: Seen in the ED 01/22/22 for RIGHT arm pain x 1 week. Sx present now x 5.5 weeks.  MOI: pain started after sleeping on the arm. Reports: numbness in the arm, discomfort with movement, TTP. Sx constant, gradually worsening.  Denies:  Treatment modalities: prescribed oral Diclofenac at the ED, vicks, ice Aggravates: shoulder ext and IR DI: XR right shoulder 01/22/22 - no acute abnormalities. Normal ECG.   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   Exam:  BP 128/76   Pulse 84   Ht '5\' 7"'$  (1.702 m)   Wt 168 lb (76.2 kg)   LMP 03/31/2016   SpO2 96%   BMI 26.31 kg/m  General: Well Developed, well nourished, and in no acute distress.   MSK: Right shoulder: Normal-appearing Mildly tender palpation anterior lateral shoulder. Decreased range of motion. Abduction limited to 120 degrees.  Functional internal rotation limited to posterior iliac crest.  External rotation is full. Strength abduction 4/5 external rotation 4/5 internal rotation 5/5. Positive Hawkins and Neer's test.  Positive empty can test. Negative Yergason's and speeds test. Pulses capillary fill and sensation are intact distally.  C-spine: Normal appearing Nontender to palpation spinal midline normal cervical motion.    Lab and Radiology Results  Procedure: Real-time Ultrasound Guided Injection of right shoulder subacromial bursa Device: Philips Affiniti 50G Images permanently  stored and available for review in PACS Ultrasound evaluation prior to injection reveals intact rotator cuff tendons with no large retracted rotator cuff tear. Moderate subacromial and subdeltoid bursitis is present. Verbal informed consent obtained.  Discussed risks and benefits of procedure. Warned about infection, bleeding, hyperglycemia damage to structures among others. Patient expresses understanding and agreement Time-out conducted.   Noted no overlying erythema, induration, or other signs of local infection.   Skin prepped in a sterile fashion.   Local anesthesia: Topical Ethyl chloride.   With sterile technique and under real time ultrasound guidance: 40 mg of Kenalog and 2 mL of Marcaine injected into subacromial bursa. Fluid seen entering the bursa.   Completed without difficulty   Pain immediately resolved suggesting accurate placement of the medication.   Advised to call if fevers/chills, erythema, induration, drainage, or persistent bleeding.   Images permanently stored and available for review in the ultrasound unit.  Impression: Technically successful ultrasound guided injection.    EXAM: RIGHT SHOULDER - 2+ VIEW   COMPARISON:  None Available.   FINDINGS: There is no evidence of fracture or dislocation. There is no evidence of arthropathy or other focal bone abnormality. Soft tissues are unremarkable.   IMPRESSION: No acute osseous finding in the right shoulder.     Electronically Signed   By: Audie Pinto M.D.   On: 01/22/2022 19:42 I, Lynne Leader, personally (independently) visualized and performed the interpretation of the images attached in this note.      Assessment and Plan: 52 y.o. female with right shoulder and arm pain.  The vast majority the pain I  believe is due to rotator cuff shoulder impingement and bursitis etiology.  Plan for subacromial injection and physical therapy referral.  She does have some paresthesias distally that are less well  understood.  I am hopeful they will resolve with the injection and physical therapy.  If not certainly could further evaluate this on recheck.  Check back in 6 weeks. The shoulder pain does not become chronic and ongoing.  This is the third visit to healthcare for this.  PDMP not reviewed this encounter. Orders Placed This Encounter  Procedures   [place] LIMITED JOINT SPACE STRUCTURES UP RIGHT(NO LINKED CHARGES)    Order Specific Question:   Reason for Exam (SYMPTOM  OR DIAGNOSIS REQUIRED)    Answer:   rt shoulder    Order Specific Question:   Preferred imaging location?    Answer:   [place]   Ambulatory referral to Physical Therapy    Referral Priority:   Routine    Referral Type:   Physical Medicine    Referral Reason:   Specialty Services Required    Requested Specialty:   Physical Therapy   No orders of the defined types were placed in this encounter.    Discussed warning signs or symptoms. Please see discharge instructions. Patient expresses understanding.   The above documentation has been reviewed and is accurate and complete Lynne Leader, M.D.

## 2022-02-23 ENCOUNTER — Ambulatory Visit (HOSPITAL_COMMUNITY)
Admission: EM | Admit: 2022-02-23 | Discharge: 2022-02-23 | Disposition: A | Discharge status: Home / Self Care | Payer: Medicare HMO | Attending: Physician Assistant | Admitting: Physician Assistant

## 2022-02-23 ENCOUNTER — Encounter (HOSPITAL_COMMUNITY): Payer: Self-pay

## 2022-02-23 ENCOUNTER — Other Ambulatory Visit: Payer: Self-pay | Admitting: Internal Medicine

## 2022-02-23 DIAGNOSIS — I10 Essential (primary) hypertension: Secondary | ICD-10-CM | POA: Diagnosis not present

## 2022-02-23 DIAGNOSIS — G43109 Migraine with aura, not intractable, without status migrainosus: Secondary | ICD-10-CM

## 2022-02-23 HISTORY — DX: Essential (primary) hypertension: I10

## 2022-02-23 MED ORDER — AMLODIPINE BESYLATE 2.5 MG PO TABS: 2.5000 mg | ORAL_TABLET | Freq: Every day | ORAL | 0 refills | Status: DC

## 2022-02-23 MED ORDER — BACLOFEN 10 MG PO TABS: 10.0000 mg | ORAL_TABLET | Freq: Every evening | ORAL | 0 refills | Status: DC | PRN

## 2022-02-23 NOTE — Discharge Instructions (Addendum)
Take baclofen at night to help with your headaches.  This will make you sleepy so do not drive or drink alcohol with taking it.  I have also started you on another blood pressure medication.  Take amlodipine 2.5 mg daily.  This should be in addition to your metoprolol.  Follow-up with your primary care for thing tomorrow.  Avoid decongestants, caffeine, sodium, NSAIDs including aspirin, ibuprofen/Advil, naproxen/Aleve.  If you develop any severe headache, blurred vision, nausea/vomiting, weakness, confusion you need to go to the emergency room immediately.

## 2022-02-23 NOTE — Telephone Encounter (Signed)
Please refill as per office routine med refill policy (all routine meds to be refilled for 3 mo or monthly (per pt preference) up to one year from last visit, then month to month grace period for 3 mo, then further med refills will have to be denied) ? ?

## 2022-02-23 NOTE — ED Triage Notes (Signed)
Patient states she began having a headache yesterday and she checked her BP at [facility]. BP was 151/70. Patient states she has intermittent blurred vision as well.  Patient states she took her Metoprolol 1 tab an hour prior to come to the UC.

## 2022-02-23 NOTE — ED Provider Notes (Signed)
[place]    CSN: [id number] Arrival date & time: 02/23/22  1948      History   Chief Complaint Chief Complaint  Patient presents with   Hypertension   Headache    HPI Hannah Barron is a 52 y.o. female.   Patient presents today with a 2 to 3-day history of headache.  Reports that her headache is currently rated 7 on a 0-10 pain scale, localized to right lateral portion of her head, described as pressure, no relieving factors identified.  Previously this has been as high as a 10 but she believes it is improved after drinking some cinnamon tea.  She does report some associated blurred vision but states this is common with her migraines.  She does not currently have her glasses but feels that her blurred vision might have been increased from baseline.  She denies any recent head injury or medication changes.  She denies any associated dysarthria, focal weakness, nausea, vomiting, photophobia, phonophobia.  She did check her blood pressure at home and was noted to be elevated so she took an extra dose of her metoprolol.  She is concerned that her blood pressure could be contributing to her headache and interested in starting another medication.  This is not the worst headache of her life.    Past Medical History:  Diagnosis Date   Anemia    Bronchiectasis (HCC)    COPD (chronic obstructive pulmonary disease) (HCC)    GERD (gastroesophageal reflux disease)    Hyperlipidemia    Hypertension    Migraines    Paroxysmal SVT (supraventricular tachycardia)    a. diagnosed in 11/2015.   Right leg numbness     Patient Active Problem List   Diagnosis Date Noted   Allergic rhinitis 07/14/2021   Vertigo 03/29/2021   Chest pain 10/27/2020   Left lumbar radiculopathy 10/27/2020   Insomnia 10/27/2020   Grief reaction 08/03/2020   Epigastric pain 08/03/2020   Travel advice encounter 08/03/2020   Encounter for well adult exam with abnormal findings 03/23/2020   Brain cyst  03/23/2020   Non-traumatic mid back pain 03/23/2020   IBS (irritable bowel syndrome) 03/23/2020   Costochondritis 10/13/2019   Anxiety 10/13/2019   Urinary frequency 06/29/2019   Vitamin D deficiency 10/01/2018   HLD (hyperlipidemia) 10/01/2018   Right cervical radiculopathy 10/01/2018   Trapezius muscle strain, right, initial encounter 10/01/2018   Right carpal tunnel syndrome 10/01/2018   Cerumen impaction 09/07/2017   Acne vulgaris 12/28/2016   Bronchiectasis with acute exacerbation ([place]) 12/22/2016   Medicare annual wellness visit, subsequent 04/28/2016   Lung nodules 03/16/2016   Palpitations    GERD (gastroesophageal reflux disease) 07/30/2015   Bronchiectasis without acute exacerbation ([place]) 07/08/2015   COPD (chronic obstructive pulmonary disease) ([place]) 06/04/2015   Fatigue 06/04/2015    Past Surgical History:  Procedure Laterality Date   CESAREAN SECTION     CYSTOSCOPY N/A 04/05/2016   Procedure: CYSTOSCOPY;  Surgeon: Lavonia Drafts, MD;  Location: [facility];  Service: Gynecology;  Laterality: N/A;   LAPAROSCOPIC VAGINAL HYSTERECTOMY WITH SALPINGECTOMY Bilateral 04/05/2016   Procedure: LAPAROSCOPIC ASSISTED VAGINAL HYSTERECTOMY WITH SALPINGECTOMY;  Surgeon: Lavonia Drafts, MD;  Location: [facility];  Service: Gynecology;  Laterality: Bilateral;   LUNG BIOPSY     VIDEO BRONCHOSCOPY Bilateral 03/29/2016   Procedure: VIDEO BRONCHOSCOPY WITHOUT FLUORO;  Surgeon: Marshell Garfinkel, MD;  Location: [facility];  Service: Cardiopulmonary;  Laterality: Bilateral;    OB History     Gravida  1  Para  1   Term  1   Preterm      AB      Living  1      SAB      IAB      Ectopic      Multiple      Live Births  1            Home Medications    Prior to Admission medications   Medication Sig Start Date End Date Taking? Authorizing Provider  amLODipine (NORVASC) 2.5 MG tablet Take 1 tablet (2.5 mg total) by mouth daily. 02/23/22  Yes Salem Lembke K,  PA-C  baclofen (LIORESAL) 10 MG tablet Take 1 tablet (10 mg total) by mouth at bedtime as needed for muscle spasms. 02/23/22  Yes Melissaann Dizdarevic K, PA-C  albuterol (VENTOLIN HFA) 108 (90 Base) MCG/ACT inhaler INHALE 2 PUFFS INTO THE LUNGS EVERY 6 HOURS AS NEEDED FOR WHEEZING OR SHORTNESS OF BREATH 04/28/21   Mannam, Hart Robinsons, MD  atorvastatin (LIPITOR) 20 MG tablet Take 1 tablet by mouth once daily 11/09/21   Biagio Borg, MD  budesonide-formoterol [facility]) 160-4.5 MCG/ACT inhaler Inhale 2 puffs into the lungs 2 (two) times daily. 04/28/21   Marshell Garfinkel, MD  Cholecalciferol 50 MCG (2000 UT) TABS 1 tab by mouth once daily 03/25/21   Biagio Borg, MD  clonazePAM (KLONOPIN) 0.5 MG tablet Take 1 tablet (0.5 mg total) by mouth 2 (two) times daily as needed for anxiety. Patient not taking: Reported on 02/16/2022 08/03/20   Biagio Borg, MD  dicyclomine (BENTYL) 20 MG tablet Take 1 tablet (20 mg total) by mouth every 6 (six) hours. 10/28/21   Bonnita Hollow, MD  estradiol (CLIMARA) 0.05 mg/24hr patch Place 1 patch (0.05 mg total) onto the skin once a week. 11/25/20   Lavonia Drafts, MD  meclizine (ANTIVERT) 12.5 MG tablet Take 1 tablet (12.5 mg total) by mouth 3 (three) times daily as needed for dizziness. 03/25/21 03/25/22  Biagio Borg, MD  metoprolol succinate (TOPROL-XL) 50 MG 24 hr tablet Take with or immediately following a meal. Patient not taking: Reported on 02/16/2022 10/28/21   Bonnita Hollow, MD  naproxen (NAPROSYN) 500 MG tablet Take 1 tablet (500 mg total) by mouth 2 (two) times daily with a meal. 08/03/20   Biagio Borg, MD  omeprazole (PRILOSEC) 40 MG capsule Take 1 capsule by mouth once daily 08/02/21   Biagio Borg, MD  phenazopyridine (PYRIDIUM) 97 MG tablet Take 1 tablet (97 mg total) by mouth 3 (three) times daily as needed for pain. 10/28/21   Bonnita Hollow, MD  prednisoLONE acetate (PRED FORTE) 1 % ophthalmic suspension  03/09/20   [provider]  Respiratory  Therapy Supplies (FLUTTER) DEVI Use as directed. 03/16/16   Parrett, Fonnie Mu, NP  RESTASIS 0.05 % ophthalmic emulsion  04/25/15   [provider]  sodium chloride HYPERTONIC 3 % nebulizer solution Take by nebulization in the morning and at bedtime. J47.9 04/28/21   Mannam, Hart Robinsons, MD  traZODone (DESYREL) 50 MG tablet Take 0.5-1 tablets (25-50 mg total) by mouth at bedtime as needed for sleep. Patient not taking: Reported on 02/16/2022 10/27/20   Biagio Borg, MD    Family History Family History  Problem Relation Age of Onset   Healthy Mother    Healthy Father    Colon cancer Neg Hx    Stomach cancer Neg Hx    Rectal cancer Neg Hx  Esophageal cancer Neg Hx     Social History Social History   Tobacco Use   Smoking status: Never   Smokeless tobacco: Never  Vaping Use   Vaping Use: Never used  Substance Use Topics   Alcohol use: No    Alcohol/week: 0.0 standard drinks of alcohol   Drug use: No     Allergies   Patient has no known allergies.   Review of Systems Review of Systems  Constitutional:  Positive for activity change. Negative for appetite change, fatigue and fever.  Eyes:  Positive for visual disturbance. Negative for photophobia.  Respiratory:  Negative for shortness of breath.   Cardiovascular:  Negative for chest pain.  Gastrointestinal:  Negative for abdominal pain, diarrhea, nausea and vomiting.  Neurological:  Positive for headaches. Negative for dizziness, seizures, syncope, facial asymmetry, speech difficulty, weakness, light-headedness and numbness.     Physical Exam Triage Vital Signs ED Triage Vitals  Enc Vitals Group     BP 02/23/22 2000 (!) 150/78     Pulse Rate 02/23/22 2000 75     Resp 02/23/22 2000 16     Temp 02/23/22 2000 98.2 F (36.8 C)     Temp Source 02/23/22 2000 Oral     SpO2 02/23/22 2000 95 %     Weight --      Height --      Head Circumference --      Peak Flow --      Pain Score 02/23/22 2001 7     Pain Loc --       Pain Edu? --      Excl. in [place]? --    No data found.  Updated Vital Signs BP (!) 143/86 (BP Location: Left Arm)   Pulse 75   Temp 98.2 F (36.8 C) (Oral)   Resp 16   LMP 03/31/2016   SpO2 95%   Visual Acuity Right Eye Distance:   Left Eye Distance:   Bilateral Distance:    Right Eye Near:   Left Eye Near:    Bilateral Near:     Physical Exam Vitals reviewed.  Constitutional:      General: She is awake. She is not in acute distress.    Appearance: Normal appearance. She is well-developed. She is not ill-appearing.     Comments: Very pleasant female appears stated age in no acute distress sitting comfortably in exam room  HENT:     Head: Normocephalic and atraumatic. No raccoon eyes, Battle's sign or contusion.     Right Ear: Tympanic membrane, ear canal and external ear normal. No hemotympanum.     Left Ear: Tympanic membrane, ear canal and external ear normal. No hemotympanum.     Mouth/Throat:     Tongue: Tongue does not deviate from midline.     Pharynx: Uvula midline. No oropharyngeal exudate or posterior oropharyngeal erythema.  Eyes:     Extraocular Movements: Extraocular movements intact.     Conjunctiva/sclera: Conjunctivae normal.     Pupils: Pupils are equal, round, and reactive to light.  Cardiovascular:     Rate and Rhythm: Normal rate and regular rhythm.     Heart sounds: Normal heart sounds, S1 normal and S2 normal. No murmur heard. Pulmonary:     Effort: Pulmonary effort is normal.     Breath sounds: Normal breath sounds. No wheezing, rhonchi or rales.     Comments: Clear to auscultation bilaterally Abdominal:     General: Bowel sounds are normal.  Palpations: Abdomen is soft.     Tenderness: There is no abdominal tenderness.  Musculoskeletal:     Cervical back: Normal range of motion and neck supple. No spinous process tenderness or muscular tenderness.     Comments: Strength 5/5 bilateral upper and lower extremities  Neurological:     General:  No focal deficit present.     Cranial Nerves: Cranial nerves 2-12 are intact.     Motor: Motor function is intact.     Coordination: Coordination is intact.     Gait: Gait is intact.     Comments: Cranial nerves II through XII grossly intact.  No focal neurological defect on exam.  Psychiatric:        Behavior: Behavior is cooperative.      UC Treatments / Results  Labs (all labs ordered are listed, but only abnormal results are displayed) Labs Reviewed - No data to display  EKG   Radiology No results found.  Procedures Procedures (including critical care time)  Medications Ordered in UC Medications - No data to display  Initial Impression / Assessment and Plan / UC Course  I have reviewed the triage vital signs and the nursing notes.  Pertinent labs & imaging results that were available during my care of the patient were reviewed by me and considered in my medical decision making (see chart for details).     Patient is well-appearing, afebrile, nontoxic, nontachycardic.  She is mildly hypertensive on intake.  We initially discussed potentially to leave going to the emergency room for headache and hypertension particularly given she has had some blurred vision, however, patient declined this and on further discussion she reports that she has a history of migraines and current symptoms are similar to previous episodes of this condition.  Will treat her migraine with baclofen and over-the-counter analgesics.  Discussed that baclofen is sedating and she should not drive or drink alcohol while taking it.  We will also add a low-dose blood pressure medication of amlodipine 2.5 mg daily.  Recommend that she follow-up closely with her primary care.  She is to avoid NSAIDs, decongestants, caffeine, sodium.  Discussed that if she has any persistent or worsening symptoms including severe/worsening headache, vision change, shortness of breath, chest pain, focal weakness, dysarthria she needs  to go to the emergency room immediately.  Patient is agreeable and will go directly to the emergency room with any persistent or worsening symptoms.  She will follow-up with her primary care tomorrow.  Strict return precautions given.  All questions answered to patient satisfaction.  Final Clinical Impressions(s) / UC Diagnoses   Final diagnoses:  Migraine with aura and without status migrainosus, not intractable  Elevated blood pressure reading with diagnosis of hypertension     Discharge Instructions      Take baclofen at night to help with your headaches.  This will make you sleepy so do not drive or drink alcohol with taking it.  I have also started you on another blood pressure medication.  Take amlodipine 2.5 mg daily.  This should be in addition to your metoprolol.  Follow-up with your primary care for thing tomorrow.  Avoid decongestants, caffeine, sodium, NSAIDs including aspirin, ibuprofen/Advil, naproxen/Aleve.  If you develop any severe headache, blurred vision, nausea/vomiting, weakness, confusion you need to go to the emergency room immediately.     ED Prescriptions     Medication Sig Dispense Auth. Provider   baclofen (LIORESAL) 10 MG tablet Take 1 tablet (10 mg total) by mouth  at bedtime as needed for muscle spasms. 10 each Lili Harts K, PA-C   amLODipine (NORVASC) 2.5 MG tablet Take 1 tablet (2.5 mg total) by mouth daily. 30 tablet Korey Arroyo, Derry Skill, PA-C      PDMP not reviewed this encounter.   Terrilee Croak, PA-C 02/23/22 2027

## 2022-02-25 ENCOUNTER — Ambulatory Visit (INDEPENDENT_AMBULATORY_CARE_PROVIDER_SITE_OTHER): Payer: Medicare HMO | Admitting: Internal Medicine

## 2022-02-25 ENCOUNTER — Ambulatory Visit (INDEPENDENT_AMBULATORY_CARE_PROVIDER_SITE_OTHER): Payer: Medicare HMO

## 2022-02-25 VITALS — BP 130/78 | HR 97 | Temp 98.5°F | Ht 67.0 in | Wt 162.0 lb

## 2022-02-25 DIAGNOSIS — Z0001 Encounter for general adult medical examination with abnormal findings: Secondary | ICD-10-CM | POA: Diagnosis not present

## 2022-02-25 DIAGNOSIS — H6123 Impacted cerumen, bilateral: Secondary | ICD-10-CM | POA: Diagnosis not present

## 2022-02-25 DIAGNOSIS — G4489 Other headache syndrome: Secondary | ICD-10-CM | POA: Diagnosis not present

## 2022-02-25 DIAGNOSIS — R079 Chest pain, unspecified: Secondary | ICD-10-CM

## 2022-02-25 DIAGNOSIS — E7800 Pure hypercholesterolemia, unspecified: Secondary | ICD-10-CM | POA: Diagnosis not present

## 2022-02-25 DIAGNOSIS — E559 Vitamin D deficiency, unspecified: Secondary | ICD-10-CM

## 2022-02-25 DIAGNOSIS — E538 Deficiency of other specified B group vitamins: Secondary | ICD-10-CM

## 2022-02-25 DIAGNOSIS — R69 Illness, unspecified: Secondary | ICD-10-CM | POA: Diagnosis not present

## 2022-02-25 DIAGNOSIS — J439 Emphysema, unspecified: Secondary | ICD-10-CM | POA: Diagnosis not present

## 2022-02-25 DIAGNOSIS — F419 Anxiety disorder, unspecified: Secondary | ICD-10-CM

## 2022-02-25 DIAGNOSIS — R739 Hyperglycemia, unspecified: Secondary | ICD-10-CM | POA: Diagnosis not present

## 2022-02-25 MED ORDER — TRAMADOL HCL 50 MG PO TABS: 50.0000 mg | ORAL_TABLET | Freq: Four times a day (QID) | ORAL | 0 refills | Status: DC | PRN

## 2022-02-25 NOTE — Progress Notes (Unsigned)
Patient ID: Hannah Barron, female   DOB: 1970/11/24, 52 y.o.   MRN: [id number]         Chief Complaint:: wellness exam and bilateral ear wax impactions, chest pain,  anxiety, headaache       HPI:  Hannah Barron is a 52 y.o. female here for wellness exam; declines covid booster, flu shot, shingrix o/w up to date                        Also c/o bialteral ear wax impactions, just can't seem to clear at home.  Also c/o tender soreness sharp intermittent but mod to severe for unclear reason the lower sternal area without rash or swelling.  Pt denies other chest pain, increased sob or doe, wheezing, orthopnea, PND, increased LE swelling, palpitations, dizziness or syncope. Denies worsening depressive symptoms, suicidal ideation, or panic; has ongoing anxiety,  Pt denies polydipsia, polyuria, or new focal neuro s/s, except for severe pain at the crown of the scalp without scalp changes, swelling, or trauma.     Wt Readings from Last 3 Encounters:  02/25/22 162 lb (73.5 kg)  02/17/22 168 lb (76.2 kg)  01/22/22 167 lb 1.7 oz (75.8 kg)   BP Readings from Last 3 Encounters:  02/25/22 130/78  02/23/22 (!) 143/86  02/17/22 128/76   Immunization History  Administered Date(s) Administered   Influenza,inj,Quad PF,6+ Mos 11/07/2015, 10/26/2016, 09/20/2017, 09/03/2018, 10/08/2019, 10/27/2020   PFIZER(Purple Top)SARS-COV-2 Vaccination 03/11/2019, 04/01/2019, 01/18/2020   Pneumococcal Polysaccharide-23 11/07/2015  There are no preventive care reminders to display for this patient.    Past Medical History:  Diagnosis Date   Anemia    Bronchiectasis (HCC)    COPD (chronic obstructive pulmonary disease) (HCC)    GERD (gastroesophageal reflux disease)    Hyperlipidemia    Hypertension    Migraines    Paroxysmal SVT (supraventricular tachycardia)    a. diagnosed in 11/2015.   Right leg numbness    Past Surgical History:  Procedure Laterality Date   CESAREAN SECTION     CYSTOSCOPY N/A 04/05/2016    Procedure: CYSTOSCOPY;  Surgeon: Lavonia Drafts, MD;  Location: [facility];  Service: Gynecology;  Laterality: N/A;   LAPAROSCOPIC VAGINAL HYSTERECTOMY WITH SALPINGECTOMY Bilateral 04/05/2016   Procedure: LAPAROSCOPIC ASSISTED VAGINAL HYSTERECTOMY WITH SALPINGECTOMY;  Surgeon: Lavonia Drafts, MD;  Location: [facility];  Service: Gynecology;  Laterality: Bilateral;   LUNG BIOPSY     VIDEO BRONCHOSCOPY Bilateral 03/29/2016   Procedure: VIDEO BRONCHOSCOPY WITHOUT FLUORO;  Surgeon: Marshell Garfinkel, MD;  Location: [facility];  Service: Cardiopulmonary;  Laterality: Bilateral;    reports that she has never smoked. She has never used smokeless tobacco. She reports that she does not drink alcohol and does not use drugs. family history includes Healthy in her father and mother. No Known Allergies Current Outpatient Medications on File Prior to Visit  Medication Sig Dispense Refill   albuterol (VENTOLIN HFA) 108 (90 Base) MCG/ACT inhaler INHALE 2 PUFFS INTO THE LUNGS EVERY 6 HOURS AS NEEDED FOR WHEEZING OR SHORTNESS OF BREATH 8 g 5   amLODipine (NORVASC) 2.5 MG tablet Take 1 tablet (2.5 mg total) by mouth daily. 30 tablet 0   atorvastatin (LIPITOR) 20 MG tablet Take 1 tablet (20 mg total) by mouth daily. Annual appt due in March must see provider for future refills 90 tablet 0   baclofen (LIORESAL) 10 MG tablet Take 1 tablet (10 mg total) by mouth at bedtime as needed for muscle spasms. 10  each 0   budesonide-formoterol (SYMBICORT) 160-4.5 MCG/ACT inhaler Inhale 2 puffs into the lungs 2 (two) times daily. 10.2 g 5   Cholecalciferol 50 MCG (2000 UT) TABS 1 tab by mouth once daily 30 tablet 99   dicyclomine (BENTYL) 20 MG tablet Take 1 tablet (20 mg total) by mouth every 6 (six) hours. 60 tablet 5   estradiol (CLIMARA) 0.05 mg/24hr patch Place 1 patch (0.05 mg total) onto the skin once a week. 4 patch 12   meclizine (ANTIVERT) 12.5 MG tablet Take 1 tablet (12.5 mg total) by mouth 3 (three) times daily  as needed for dizziness. 40 tablet 1   naproxen (NAPROSYN) 500 MG tablet Take 1 tablet (500 mg total) by mouth 2 (two) times daily with a meal. 60 tablet 3   omeprazole (PRILOSEC) 40 MG capsule Take 1 capsule by mouth once daily 90 capsule 2   phenazopyridine (PYRIDIUM) 97 MG tablet Take 1 tablet (97 mg total) by mouth 3 (three) times daily as needed for pain. 10 tablet 0   prednisoLONE acetate (PRED FORTE) 1 % ophthalmic suspension      Respiratory Therapy Supplies (FLUTTER) DEVI Use as directed. 1 each 0   RESTASIS 0.05 % ophthalmic emulsion   2   sodium chloride HYPERTONIC 3 % nebulizer solution Take by nebulization in the morning and at bedtime. J47.9 750 mL 11   clonazePAM (KLONOPIN) 0.5 MG tablet Take 1 tablet (0.5 mg total) by mouth 2 (two) times daily as needed for anxiety. (Patient not taking: Reported on 02/16/2022) 60 tablet 0   metoprolol succinate (TOPROL-XL) 50 MG 24 hr tablet Take with or immediately following a meal. (Patient not taking: Reported on 02/16/2022) 180 tablet 3   traZODone (DESYREL) 50 MG tablet Take 0.5-1 tablets (25-50 mg total) by mouth at bedtime as needed for sleep. (Patient not taking: Reported on 02/16/2022) 90 tablet 1   No current facility-administered medications on file prior to visit.        ROS:  All others reviewed and negative.  Objective        PE:  BP 130/78 (BP Location: Right Arm, Patient Position: Sitting, Cuff Size: Large)   Pulse 97   Temp 98.5 F (36.9 C) (Oral)   Ht 5' 7"$  (1.702 m)   Wt 162 lb (73.5 kg)   LMP 03/31/2016   SpO2 96%   BMI 25.37 kg/m                 Constitutional: Pt appears in NAD               HENT: Head: NCAT.                Right Ear: External ear normal.                 Left Ear: External ear normal.                Eyes: . Pupils are equal, round, and reactive to light. Conjunctivae and EOM are normal               Nose: without d/c or deformity               Neck: Neck supple. Gross normal ROM                Cardiovascular: Normal rate and regular rhythm.                 Pulmonary/Chest: Effort normal and  breath sounds without rales or wheezing.                Abd:  Soft, NT, ND, + BS, no organomegaly               Neurological: Pt is alert. At baseline orientation, motor grossly intact               Skin: Skin is warm. No rashes, no other new lesions, LE edema - none               Marked tender to mid low sternal area, marked crown scalp tender               Psychiatric: Pt behavior is normal without agitation ,, marked nervous  Micro: none  Cardiac tracings I have personally interpreted today:  none  Pertinent Radiological findings (summarize): none   Lab Results  Component Value Date   WBC 5.9 03/25/2021   HGB 13.4 03/25/2021   HCT 40.6 03/25/2021   PLT 245.0 03/25/2021   GLUCOSE 93 03/25/2021   CHOL 160 03/25/2021   TRIG 67.0 03/25/2021   HDL 70.90 03/25/2021   LDLCALC 76 03/25/2021   ALT 17 03/25/2021   AST 19 03/25/2021   NA 139 03/25/2021   K 3.9 03/25/2021   CL 101 03/25/2021   CREATININE 0.68 03/25/2021   BUN 9 03/25/2021   CO2 29 03/25/2021   TSH 0.76 03/25/2021   INR 1.1 10/27/2014   HGBA1C 6.5 03/25/2021   Assessment/Plan:  Hannah Barron is a 52 y.o. Other or two or more races [6] female with  has a past medical history of Anemia, Bronchiectasis ([place]), COPD (chronic obstructive pulmonary disease) ([place]), GERD (gastroesophageal reflux disease), Hyperlipidemia, Hypertension, Migraines, Paroxysmal SVT (supraventricular tachycardia), and Right leg numbness.  Encounter for well adult exam with abnormal findings Age and sex appropriate education and counseling updated with regular exercise and diet Referrals for preventative services - none needed Immunizations addressed - declines covid booster, flu shot, shingrix Smoking counseling  - none needed Evidence for depression or other mood disorder - none significant Most recent labs reviewed. I have personally reviewed and  have noted: 1) the patient's medical and social history 2) The patient's current medications and supplements 3) The patient's height, weight, and BMI have been recorded in the chart   Anxiety Mod to severe today, declines need for change in tx or counseling  HLD (hyperlipidemia) Lab Results  Component Value Date   LDLCALC 76 03/25/2021   Stable, pt to continue current statin liptior 20 mg qd   Vitamin D deficiency Last vitamin D Lab Results  Component Value Date   VD25OH 27.61 (L) 03/25/2021   Low to start oral replacement   Cerumen impaction Ceruminosis is noted.  Wax is removed by syringing and manual debridement. Instructions for home care to prevent wax buildup are given.   Chest pain C/w costochondritis but severe, for tramadol prn, cxr,  to f/u any worsening symptoms or concerns  Other headache syndrome Etiology unclear, with severe pain recent onset, no scalp change, for CT head  Followup: Return in about 6 months (around 08/26/2022).  Cathlean Cower, MD 02/26/2022 9:21 PM [facility]

## 2022-02-25 NOTE — Patient Instructions (Signed)
Your ears were irrigated of wax today  Please take all new medication as prescribed - the tramadol for pain as needed  Please continue all other medications as before, and refills have been done if requested.  Please have the pharmacy call with any other refills you may need.  Please continue your efforts at being more active, low cholesterol diet, and weight control.  Please keep your appointments with your specialists as you may have planned  You will be contacted regarding the referral for: CT scan for head  Please go to the XRAY Department in the first floor for the x-ray testing  Please go to the LAB at the blood drawing area for the tests to be done  You will be contacted by phone if any changes need to be made immediately.  Otherwise, you will receive a letter about your results with an explanation, but please check with MyChart first.  Please remember to sign up for MyChart if you have not done so, as this will be important to you in the future with finding out test results, communicating by private email, and scheduling acute appointments online when needed.  Please make an Appointment to return in 6 months, or sooner if needed

## 2022-02-26 ENCOUNTER — Encounter: Payer: Self-pay | Admitting: Internal Medicine

## 2022-02-26 DIAGNOSIS — G4489 Other headache syndrome: Secondary | ICD-10-CM | POA: Insufficient documentation

## 2022-02-26 NOTE — Assessment & Plan Note (Signed)
Age and sex appropriate education and counseling updated with regular exercise and diet Referrals for preventative services - none needed Immunizations addressed - declines covid booster, flu shot, shingrix Smoking counseling  - none needed Evidence for depression or other mood disorder - none significant Most recent labs reviewed. I have personally reviewed and have noted: 1) the patient's medical and social history 2) The patient's current medications and supplements 3) The patient's height, weight, and BMI have been recorded in the chart

## 2022-02-26 NOTE — Assessment & Plan Note (Signed)
Lab Results  Component Value Date   LDLCALC 76 03/25/2021   Stable, pt to continue current statin liptior 20 mg qd

## 2022-02-26 NOTE — Assessment & Plan Note (Signed)
Last vitamin D Lab Results  Component Value Date   VD25OH 27.61 (L) 03/25/2021   Low to start oral replacement

## 2022-02-26 NOTE — Assessment & Plan Note (Signed)
Ceruminosis is noted.  Wax is removed by syringing and manual debridement. Instructions for home care to prevent wax buildup are given.  

## 2022-02-26 NOTE — Assessment & Plan Note (Signed)
Mod to severe today, declines need for change in tx or counseling

## 2022-02-26 NOTE — Assessment & Plan Note (Signed)
C/w costochondritis but severe, for tramadol prn, cxr,  to f/u any worsening symptoms or concerns

## 2022-02-26 NOTE — Assessment & Plan Note (Signed)
Etiology unclear, with severe pain recent onset, no scalp change, for CT head

## 2022-02-28 LAB — CBC WITH DIFFERENTIAL/PLATELET
Absolute Monocytes: 548 cells/uL (ref 200–950)
Basophils Absolute: 22 cells/uL (ref 0–200)
Basophils Relative: 0.3 %
Eosinophils Absolute: 22 cells/uL (ref 15–500)
Eosinophils Relative: 0.3 %
HCT: 39.6 % (ref 35.0–45.0)
Hemoglobin: 13.6 g/dL (ref 11.7–15.5)
Lymphs Abs: 2526 cells/uL (ref 850–3900)
MCH: 32.8 pg (ref 27.0–33.0)
MCHC: 34.3 g/dL (ref 32.0–36.0)
MCV: 95.4 fL (ref 80.0–100.0)
MPV: 11.8 fL (ref 7.5–12.5)
Monocytes Relative: 7.5 %
Neutro Abs: 4183 cells/uL (ref 1500–7800)
Neutrophils Relative %: 57.3 %
Platelets: 308 10*3/uL (ref 140–400)
RBC: 4.15 10*6/uL (ref 3.80–5.10)
RDW: 12.1 % (ref 11.0–15.0)
Total Lymphocyte: 34.6 %
WBC: 7.3 10*3/uL (ref 3.8–10.8)

## 2022-02-28 LAB — HEPATIC FUNCTION PANEL
AG Ratio: 1.3 (calc) (ref 1.0–2.5)
ALT: 29 U/L (ref 6–29)
AST: 20 U/L (ref 10–35)
Albumin: 4.4 g/dL (ref 3.6–5.1)
Alkaline phosphatase (APISO): 69 U/L (ref 37–153)
Bilirubin, Direct: 0.1 mg/dL (ref 0.0–0.2)
Globulin: 3.5 g/dL (calc) (ref 1.9–3.7)
Indirect Bilirubin: 0.3 mg/dL (calc) (ref 0.2–1.2)
Total Bilirubin: 0.4 mg/dL (ref 0.2–1.2)
Total Protein: 7.9 g/dL (ref 6.1–8.1)

## 2022-02-28 LAB — BASIC METABOLIC PANEL
BUN: 9 mg/dL (ref 7–25)
CO2: 25 mmol/L (ref 20–32)
Calcium: 9.7 mg/dL (ref 8.6–10.4)
Chloride: 105 mmol/L (ref 98–110)
Creat: 0.7 mg/dL (ref 0.50–1.03)
Glucose, Bld: 109 mg/dL — ABNORMAL HIGH (ref 65–99)
Potassium: 3.7 mmol/L (ref 3.5–5.3)
Sodium: 141 mmol/L (ref 135–146)

## 2022-02-28 LAB — LIPID PANEL
Cholesterol: 197 mg/dL (ref <200)
HDL: 75 mg/dL (ref >=50)
LDL Cholesterol (Calc): 107 mg/dL (calc) — ABNORMAL HIGH
Non-HDL Cholesterol (Calc): 122 mg/dL (calc) (ref <130)
Total CHOL/HDL Ratio: 2.6 (calc) (ref <5.0)
Triglycerides: 67 mg/dL (ref <150)

## 2022-02-28 LAB — TSH: TSH: 0.92 mIU/L

## 2022-02-28 LAB — HEMOGLOBIN A1C
Hgb A1c MFr Bld: 6.1 % of total Hgb — ABNORMAL HIGH (ref <5.7)
Mean Plasma Glucose: 128 mg/dL
eAG (mmol/L): 7.1 mmol/L

## 2022-02-28 LAB — VITAMIN D 25 HYDROXY (VIT D DEFICIENCY, FRACTURES): Vit D, 25-Hydroxy: 35 ng/mL (ref 30–100)

## 2022-02-28 LAB — EXTRA URINE SPECIMEN

## 2022-02-28 LAB — URINALYSIS, ROUTINE W REFLEX MICROSCOPIC

## 2022-02-28 LAB — VITAMIN B12: Vitamin B-12: 1143 pg/mL — ABNORMAL HIGH (ref 200–1100)

## 2022-03-02 ENCOUNTER — Ambulatory Visit: Payer: Medicare HMO

## 2022-03-02 ENCOUNTER — Telehealth: Payer: Self-pay | Admitting: Internal Medicine

## 2022-03-02 NOTE — Telephone Encounter (Signed)
Informed patient of results, no further questions at this time

## 2022-03-02 NOTE — Telephone Encounter (Signed)
PT visits today as a follow up from their last visit. While they were here they were inquiring on their last set of labs and was wanting to know if someone could reach out to them to go over the results from those labs.  CB: [phone]

## 2022-03-02 NOTE — Progress Notes (Signed)
PRE-PROCEDURE EXAM: Left and right TM cannot be visualized due to total occlusion/impaction of the ear canal.  PROCEDURE INDICATION: remove wax to visualize ear drum & relieve discomfort  CONSENT:  Verbal     PROCEDURE NOTE:     LEFT amd RIGHT EAR:  I used warm water irrigation under direct visualization with the otoscope to free the wax bolus from the ear canal.    POST- PROCEDURE EXAM: TMs successfully visualized and found to have no erythema     The patient tolerated the procedure well.

## 2022-03-03 ENCOUNTER — Other Ambulatory Visit: Payer: Medicare HMO

## 2022-03-07 ENCOUNTER — Ambulatory Visit: Payer: Medicare HMO | Admitting: Physical Therapy

## 2022-03-16 DIAGNOSIS — H524 Presbyopia: Secondary | ICD-10-CM | POA: Diagnosis not present

## 2022-03-23 ENCOUNTER — Ambulatory Visit (INDEPENDENT_AMBULATORY_CARE_PROVIDER_SITE_OTHER): Payer: Medicare HMO | Admitting: Obstetrics & Gynecology

## 2022-03-23 ENCOUNTER — Encounter: Payer: Self-pay | Admitting: Obstetrics & Gynecology

## 2022-03-23 VITALS — BP 105/68 | HR 80 | Ht 67.0 in | Wt 162.0 lb

## 2022-03-23 DIAGNOSIS — Z1211 Encounter for screening for malignant neoplasm of colon: Secondary | ICD-10-CM

## 2022-03-23 DIAGNOSIS — Z1231 Encounter for screening mammogram for malignant neoplasm of breast: Secondary | ICD-10-CM

## 2022-03-23 DIAGNOSIS — R1010 Upper abdominal pain, unspecified: Secondary | ICD-10-CM | POA: Diagnosis not present

## 2022-03-23 DIAGNOSIS — Z01419 Encounter for gynecological examination (general) (routine) without abnormal findings: Secondary | ICD-10-CM | POA: Diagnosis not present

## 2022-03-23 NOTE — Progress Notes (Signed)
Subjective:     Hannah Barron is a 52 y.o. female here for a routine exam.  Current complaints: Pt reports 3 months of pain on her left side/mid abdomen. She notes it most when she bends over to pray. She reports that it does not limit her dialy activities and she takes no meds for this however, it is persistent and concerns her. She is UTD on her colon cancer screening. She denies constipation or other bowel issues. She is s/p hysterectomy.     Gynecologic History Patient's last menstrual period was 03/31/2016. Contraception: status post hysterectomy Last Pap: 04/01/2016. Results were: +hrHPV Last mammogram: 07/04/2021. Results were: normal  Obstetric History OB History  Gravida Para Term Preterm AB Living  '1 1 1     1  '$ SAB IAB Ectopic Multiple Live Births          1    # Outcome Date GA Lbr Len/2nd Weight Sex Delivery Anes PTL Lv  1 Term     F CS-Unspec   LIV    The following portions of the patient's history were reviewed and updated as appropriate: allergies, current medications, past family history, past medical history, past social history, past surgical history, and problem list.  Review of Systems Pertinent items are noted in HPI.    Objective:  BP 105/68   Pulse 80   Ht '5\' 7"'$  (1.702 m)   Wt 162 lb (73.5 kg)   LMP 03/31/2016   BMI 25.37 kg/m   General Appearance:    Alert, cooperative, no distress, appears stated age  Head:    Normocephalic, without obvious abnormality, atraumatic  Eyes:    conjunctiva/corneas clear, EOM's intact, both eyes  Ears:    Normal external ear canals, both ears  Nose:   Nares normal, septum midline, mucosa normal, no drainage    or sinus tenderness  Throat:   Lips, mucosa, and tongue normal; teeth and gums normal  Neck:   Supple, symmetrical, trachea midline, no adenopathy;    thyroid:  no enlargement/tenderness/nodules  Back:     Symmetric, no curvature, ROM normal, no CVA tenderness  Lungs:     respirations unlabored  Chest Wall:    No  tenderness or deformity   Heart:    Regular rate and rhythm  Breast Exam:    No tenderness, masses, or nipple abnormality  Abdomen:     Soft, + tenderness in the left side- mid abd. + bowel sounds active all four quadrants,  no masses, no organomegaly.   Genitalia:    Normal female without lesion, discharge or tenderness   Cuff well healed.      Extremities:   Extremities normal, atraumatic, no cyanosis or edema  Pulses:   2+ and symmetric all extremities  Skin:   Skin color, texture, turgor normal, no rashes or lesions     Assessment:    Healthy female exam.  Left Mid/abd    Plan:   Hannah Barron was seen today for gynecologic exam.  Diagnoses and all orders for this visit:  Pain of upper abdomen -     CT ABDOMEN PELVIS W WO CONTRAST; Future  Screening mammogram, encounter for -     MM 3D SCREENING MAMMOGRAM BILATERAL BREAST; Future  Screening for colon cancer -     Ambulatory referral to Gastroenterology  Well female exam with routine gynecological exam   F/u in 1 year or sooner prn  Pt is planning to start working in [place]. She will keep her care  here.   Hannah Barron. Hannah Barron, M.D., Hannah Barron

## 2022-03-23 NOTE — Progress Notes (Signed)
Patient presents for annual exam. Patient has had hysterectomy. Larsen Zettel RN  

## 2022-03-24 ENCOUNTER — Telehealth: Payer: Self-pay | Admitting: General Practice

## 2022-03-24 NOTE — Telephone Encounter (Signed)
Left message on VM for pt to contact our office to schedule CT scan with Radiology.  Radiology number given to patient as well.

## 2022-03-25 ENCOUNTER — Ambulatory Visit: Payer: Medicare HMO

## 2022-03-31 ENCOUNTER — Ambulatory Visit: Payer: Medicare HMO | Admitting: Family Medicine

## 2022-03-31 ENCOUNTER — Other Ambulatory Visit: Payer: Self-pay

## 2022-03-31 VITALS — BP 108/74 | HR 77 | Ht 67.0 in | Wt 164.0 lb

## 2022-03-31 DIAGNOSIS — G5601 Carpal tunnel syndrome, right upper limb: Secondary | ICD-10-CM

## 2022-03-31 DIAGNOSIS — M25531 Pain in right wrist: Secondary | ICD-10-CM

## 2022-03-31 DIAGNOSIS — M25511 Pain in right shoulder: Secondary | ICD-10-CM | POA: Diagnosis not present

## 2022-03-31 NOTE — Patient Instructions (Addendum)
Thank you for coming in today.   Call or go to the ER if you develop a large red swollen joint with extreme pain or oozing puss.    Return on or after May 8 and consider another shoulder injection.   Continue carpal tunnel brace at night .   We could use gabapentin for nerve pain.  Let me know.

## 2022-03-31 NOTE — Progress Notes (Signed)
Shirlyn Goltz, PhD, LAT, ATC acting as a scribe for Lynne Leader, MD.  Hannah Barron is a 52 y.o. female who presents to [place] at [facility] today for f/u R arm pain. Pt was last seen by Dr. Georgina Snell on 02/17/22 and was given a R subacromial steroid injection and was referred to PT, canceled 2 visits. Today, pt reports she had conflicts w/ her PT visits w/ work.  She notes pain in the right lateral upper arm related to shoulder and arm motion.  The injection helped but the pain is starting to return now.  She also notes paresthesias and pain especially into her hand especially at bedtime.  She notes this has been ongoing for years.  She is using carpal tunnel wrist brace at night which seems to be very helpful.  Dx imaging: 01/22/22 R shoulder XR  Pertinent review of systems: No fevers or chills  Relevant historical information: COPD   Exam:  BP 108/74   Pulse 77   Ht 5\' 7"  (1.702 m)   Wt 164 lb (74.4 kg)   LMP 03/31/2016   SpO2 97%   BMI 25.69 kg/m  General: Well Developed, well nourished, and in no acute distress.   MSK: Right shoulder: Normal-appearing motion pain with abduction.  Intact strength.  Right hand and wrist normal appearing Positive Tinel's and Phalen's test carpal tunnel.  Grip strength is intact.    Lab and Radiology Results  Procedure: Real-time Ultrasound Guided median nerve hydrodissection at carpal tunnel right side Device: Philips Affiniti 50G Images permanently stored and available for review in PACS Verbal informed consent obtained.  Discussed risks and benefits of procedure. Warned about infection, bleeding, hyperglycemia damage to structures among others. Patient expresses understanding and agreement Time-out conducted.   Noted no overlying erythema, induration, or other signs of local infection.   Skin prepped in a sterile fashion.   Local anesthesia: Topical Ethyl chloride.   With sterile technique and under real time  ultrasound guidance: 40 mg of Kenalog and 1 mL of lidocaine injected into the carpal tunnel around right median nerve. Fluid seen entering the carpal tunnel.   Completed without difficulty   Pain immediately resolved suggesting accurate placement of the medication.   Advised to call if fevers/chills, erythema, induration, drainage, or persistent bleeding.   Images permanently stored and available for review in the ultrasound unit.  Impression: Technically successful ultrasound guided injection.        Assessment and Plan: 52 y.o. female with right shoulder pain improving with subacromial injection and a little bit of physical therapy and home exercise program.  Plan to continue home exercise program and watchful waiting.  Consider repeat injection at the 39-month mark which would be in early May.  Right hand paresthesias thought to be carpal tunnel syndrome.  She is already using night splints which is a great idea.  Plan for carpal tunnel injection/median nerve hydrodissection.   PDMP not reviewed this encounter. Orders Placed This Encounter  Procedures   [place] LIMITED JOINT SPACE STRUCTURES UP RIGHT(NO LINKED CHARGES)    Order Specific Question:   Reason for Exam (SYMPTOM  OR DIAGNOSIS REQUIRED)    Answer:   right wrist pain    Order Specific Question:   Preferred imaging location?    Answer:   [facility]   No orders of the defined types were placed in this encounter.    Discussed warning signs or symptoms. Please see discharge instructions. Patient expresses  understanding.   The above documentation has been reviewed and is accurate and complete Lynne Leader, M.D.

## 2022-04-01 ENCOUNTER — Other Ambulatory Visit: Payer: Self-pay | Admitting: Internal Medicine

## 2022-04-02 ENCOUNTER — Ambulatory Visit (HOSPITAL_BASED_OUTPATIENT_CLINIC_OR_DEPARTMENT_OTHER): Payer: Medicare HMO

## 2022-04-11 ENCOUNTER — Other Ambulatory Visit: Payer: Self-pay | Admitting: Family Medicine

## 2022-04-11 DIAGNOSIS — R109 Unspecified abdominal pain: Secondary | ICD-10-CM

## 2022-04-23 ENCOUNTER — Ambulatory Visit (HOSPITAL_BASED_OUTPATIENT_CLINIC_OR_DEPARTMENT_OTHER): Payer: Medicare HMO

## 2022-04-25 ENCOUNTER — Telehealth (HOSPITAL_BASED_OUTPATIENT_CLINIC_OR_DEPARTMENT_OTHER): Payer: Self-pay

## 2022-04-26 ENCOUNTER — Telehealth: Payer: Self-pay

## 2022-04-26 NOTE — Telephone Encounter (Signed)
Called patient to schedule Medicare Annual Wellness Visit (AWV). No voicemail available to leave a message.  Last date of AWV: 05/04/22  Please schedule an appointment at any time after 05/04/22 with [facility] on Annual Wellness Schedule.    [name], CMA (AAMA)  [facility]- AWV Program [phone]

## 2022-05-18 ENCOUNTER — Ambulatory Visit: Payer: Medicare HMO | Admitting: Family Medicine

## 2022-06-17 ENCOUNTER — Telehealth (HOSPITAL_BASED_OUTPATIENT_CLINIC_OR_DEPARTMENT_OTHER): Payer: Self-pay

## 2022-06-20 ENCOUNTER — Other Ambulatory Visit: Payer: Medicare HMO

## 2022-06-22 ENCOUNTER — Telehealth (HOSPITAL_BASED_OUTPATIENT_CLINIC_OR_DEPARTMENT_OTHER): Payer: Self-pay

## 2022-07-19 ENCOUNTER — Ambulatory Visit
Admission: RE | Admit: 2022-07-19 | Discharge: 2022-07-19 | Disposition: A | Discharge status: Home / Self Care | Payer: Medicare HMO | Source: Ambulatory Visit | Attending: Pulmonary Disease | Admitting: Pulmonary Disease

## 2022-07-19 ENCOUNTER — Encounter: Payer: Self-pay | Admitting: Internal Medicine

## 2022-07-19 ENCOUNTER — Ambulatory Visit (INDEPENDENT_AMBULATORY_CARE_PROVIDER_SITE_OTHER): Payer: Medicare HMO | Admitting: Internal Medicine

## 2022-07-19 VITALS — BP 128/80 | HR 75 | Temp 98.9°F | Ht 67.0 in | Wt 158.0 lb

## 2022-07-19 DIAGNOSIS — J479 Bronchiectasis, uncomplicated: Secondary | ICD-10-CM

## 2022-07-19 DIAGNOSIS — E559 Vitamin D deficiency, unspecified: Secondary | ICD-10-CM

## 2022-07-19 DIAGNOSIS — E7800 Pure hypercholesterolemia, unspecified: Secondary | ICD-10-CM | POA: Diagnosis not present

## 2022-07-19 DIAGNOSIS — R928 Other abnormal and inconclusive findings on diagnostic imaging of breast: Secondary | ICD-10-CM | POA: Diagnosis not present

## 2022-07-19 DIAGNOSIS — H6123 Impacted cerumen, bilateral: Secondary | ICD-10-CM

## 2022-07-19 DIAGNOSIS — M5416 Radiculopathy, lumbar region: Secondary | ICD-10-CM

## 2022-07-19 DIAGNOSIS — J309 Allergic rhinitis, unspecified: Secondary | ICD-10-CM | POA: Diagnosis not present

## 2022-07-19 DIAGNOSIS — R0789 Other chest pain: Secondary | ICD-10-CM

## 2022-07-19 DIAGNOSIS — R109 Unspecified abdominal pain: Secondary | ICD-10-CM

## 2022-07-19 DIAGNOSIS — J449 Chronic obstructive pulmonary disease, unspecified: Secondary | ICD-10-CM | POA: Diagnosis not present

## 2022-07-19 DIAGNOSIS — I700 Atherosclerosis of aorta: Secondary | ICD-10-CM | POA: Diagnosis not present

## 2022-07-19 MED ORDER — MONTELUKAST SODIUM 10 MG PO TABS
10.0000 mg | ORAL_TABLET | Freq: Every day | ORAL | 3 refills | Status: DC
Start: 2022-07-19 — End: 2023-11-22

## 2022-07-19 MED ORDER — OMEPRAZOLE 40 MG PO CPDR: 40.0000 mg | DELAYED_RELEASE_CAPSULE | Freq: Every day | ORAL | 3 refills | Status: DC

## 2022-07-19 MED ORDER — IBUPROFEN 800 MG PO TABS: 800.0000 mg | ORAL_TABLET | Freq: Three times a day (TID) | ORAL | 2 refills | Status: DC | PRN

## 2022-07-19 MED ORDER — ATORVASTATIN CALCIUM 40 MG PO TABS
40.0000 mg | ORAL_TABLET | Freq: Every day | ORAL | 3 refills | Status: DC
Start: 2022-07-19 — End: 2023-09-12

## 2022-07-19 MED ORDER — DICYCLOMINE HCL 20 MG PO TABS: 20.0000 mg | ORAL_TABLET | Freq: Four times a day (QID) | ORAL | 2 refills | Status: DC

## 2022-07-19 NOTE — Progress Notes (Unsigned)
PRE-PROCEDURE EXAM: bilateral TM cannot be visualized due to total occlusion/impaction of the ear canal.  PROCEDURE INDICATION: remove wax to visualize ear drum & relieve discomfort  CONSENT:  Verbal     PROCEDURE NOTE:     bilateral EAR:  I used warm water irrigation under direct visualization with the otoscope to free the wax bolus from the ear canal.     POST- PROCEDURE EXAM:  bilateral TMs successfully visualized and found to have no erythema     The patient tolerated the procedure well.

## 2022-07-19 NOTE — Patient Instructions (Addendum)
Please have your Shingrix (shingles) shots done at your local pharmacy.  Please take all new medication as prescribed - the singulair 10 mg for allergies  You had the steroid shot today  Ok to increase the lipitor to 40mg  per day  Ok to use the OTC Voltaren Gel as needed for chest wall pain, and right lower back pain as needed  Please continue all other medications as before, and refills have been done if requested.  Please have the pharmacy call with any other refills you may need.  Please continue your efforts at being more active, low cholesterol diet, and weight control.  You are otherwise up to date with prevention measures today.  Please keep your appointments with your specialists as you may have planned - Sports Medicine for the right lower back pain  No further lab work needed today  Please make an Appointment to return in 6 months, or sooner if needed, also with Lab Appointment for testing done 3-5 days before at the FIRST FLOOR Lab (so this is for TWO appointments - please see the scheduling desk as you leave)

## 2022-07-19 NOTE — Progress Notes (Unsigned)
Patient ID: Hannah Barron, female   DOB: October 26, 1970, 52 y.o.   MRN: [id number]        Chief Complaint: follow up allergies, hld, chest wall pain, right lumbar radiculitis.. copd, low vit d       HPI:  Hannah Barron is a 52 y.o. female Does have several wks ongoing nasal allergy symptoms with clearish congestion, itch and sneezing, without fever, pain, ST, cough, swelling or wheezing.  Pt denies chest pain, increased sob or doe, wheezing, orthopnea, PND, increased LE swelling, palpitations, dizziness or syncope, except for soreness to the mid chest wall keloid.   Pt denies polydipsia, polyuria, or new focal neuro s/s, except for mild recurrent right lumbar pain with radiation to the right upper leg, mild to mod intermittent for > 6 mo.         Wt Readings from Last 3 Encounters:  07/20/22 156 lb (70.8 kg)  07/20/22 155 lb 12.8 oz (70.7 kg)  07/19/22 158 lb (71.7 kg)   BP Readings from Last 3 Encounters:  07/20/22 116/62  07/20/22 116/62  07/19/22 128/80         Past Medical History:  Diagnosis Date   Anemia    Bronchiectasis (HCC)    COPD (chronic obstructive pulmonary disease) (HCC)    GERD (gastroesophageal reflux disease)    Hyperlipidemia    Hypertension    Migraines    Paroxysmal SVT (supraventricular tachycardia)    a. diagnosed in 11/2015.   Right leg numbness    Past Surgical History:  Procedure Laterality Date   CESAREAN SECTION     CYSTOSCOPY N/A 04/05/2016   Procedure: CYSTOSCOPY;  Surgeon: Willodean Rosenthal, MD;  Location: [facility];  Service: Gynecology;  Laterality: N/A;   LAPAROSCOPIC VAGINAL HYSTERECTOMY WITH SALPINGECTOMY Bilateral 04/05/2016   Procedure: LAPAROSCOPIC ASSISTED VAGINAL HYSTERECTOMY WITH SALPINGECTOMY;  Surgeon: Willodean Rosenthal, MD;  Location: [facility]S;  Service: Gynecology;  Laterality: Bilateral;   LUNG BIOPSY     VIDEO BRONCHOSCOPY Bilateral 03/29/2016   Procedure: VIDEO BRONCHOSCOPY WITHOUT FLUORO;  Surgeon: Chilton Greathouse, MD;  Location: [facility]  ENDOSCOPY;  Service: Cardiopulmonary;  Laterality: Bilateral;    reports that she has never smoked. She has never used smokeless tobacco. She reports that she does not drink alcohol and does not use drugs. family history includes Healthy in her father and mother. No Known Allergies Current Outpatient Medications on File Prior to Visit  Medication Sig Dispense Refill   amLODipine (NORVASC) 2.5 MG tablet Take 1 tablet (2.5 mg total) by mouth daily. 30 tablet 0   Cholecalciferol 50 MCG (2000 UT) TABS 1 tab by mouth once daily 30 tablet 99   clonazePAM (KLONOPIN) 0.5 MG tablet Take 1 tablet (0.5 mg total) by mouth 2 (two) times daily as needed for anxiety. 60 tablet 0   estradiol (CLIMARA) 0.05 mg/24hr patch Place 1 patch (0.05 mg total) onto the skin once a week. 4 patch 12   metoprolol succinate (TOPROL-XL) 50 MG 24 hr tablet Take with or immediately following a meal. 180 tablet 3   naproxen (NAPROSYN) 500 MG tablet Take 1 tablet (500 mg total) by mouth 2 (two) times daily with a meal. 60 tablet 3   phenazopyridine (PYRIDIUM) 97 MG tablet Take 1 tablet (97 mg total) by mouth 3 (three) times daily as needed for pain. 10 tablet 0   prednisoLONE acetate (PRED FORTE) 1 % ophthalmic suspension      Respiratory Therapy Supplies (FLUTTER) DEVI Use as directed. 1 each 0  RESTASIS 0.05 % ophthalmic emulsion   2   traMADol (ULTRAM) 50 MG tablet Take 1 tablet (50 mg total) by mouth every 6 (six) hours as needed. 30 tablet 0   traZODone (DESYREL) 50 MG tablet Take 0.5-1 tablets (25-50 mg total) by mouth at bedtime as needed for sleep. 90 tablet 1   No current facility-administered medications on file prior to visit.        ROS:  All others reviewed and negative.  Objective        PE:  BP 128/80 (BP Location: Right Arm, Patient Position: Sitting, Cuff Size: Normal)   Pulse 75   Temp 98.9 F (37.2 C) (Oral)   Ht 5\' 7"  (1.702 m)   Wt 158 lb (71.7 kg)   LMP 03/31/2016   SpO2 98%   BMI 24.75 kg/m                  Constitutional: Pt appears in NAD               HENT: Head: NCAT.                Right Ear: External ear normal.                 Left Ear: External ear normal. Bilat tm's with mild erythema.  Max sinus areas non tender.  Pharynx with mild erythema, no exudate               Eyes: . Pupils are equal, round, and reactive to light. Conjunctivae and EOM are normal               Nose: without d/c or deformity               Neck: Neck supple. Gross normal ROM               Cardiovascular: Normal rate and regular rhythm.                 Pulmonary/Chest: Effort normal and breath sounds without rales or wheezing.                Abd:  Soft, NT, ND, + BS, no organomegaly               Neurological: Pt is alert. At baseline orientation, motor grossly intact               Skin: Skin is warm. No rashes, no other new lesions, LE edema - none               Psychiatric: Pt behavior is normal without agitation   Micro: none  Cardiac tracings I have personally interpreted today:  none  Pertinent Radiological findings (summarize): none   Lab Results  Component Value Date   WBC 7.3 02/25/2022   HGB 13.6 02/25/2022   HCT 39.6 02/25/2022   PLT 308 02/25/2022   GLUCOSE 109 (H) 02/25/2022   CHOL 197 02/25/2022   TRIG 67 02/25/2022   HDL 75 02/25/2022   LDLCALC 107 (H) 02/25/2022   ALT 29 02/25/2022   AST 20 02/25/2022   NA 141 02/25/2022   K 3.7 02/25/2022   CL 105 02/25/2022   CREATININE 0.70 02/25/2022   BUN 9 02/25/2022   CO2 25 02/25/2022   TSH 0.92 02/25/2022   INR 1.1 10/27/2014   HGBA1C 6.1 (H) 02/25/2022   Assessment/Plan:  Hannah Barron is a 52 y.o. Other or two or more races [6]  female with  has a past medical history of Anemia, Bronchiectasis (HCC), COPD (chronic obstructive pulmonary disease) (HCC), GERD (gastroesophageal reflux disease), Hyperlipidemia, Hypertension, Migraines, Paroxysmal SVT (supraventricular tachycardia), and Right leg numbness.  COPD (chronic  obstructive pulmonary disease) (HCC) Stable overall, to continue the inhaler prn  HLD (hyperlipidemia) Lab Results  Component Value Date   LDLCALC 107 (H) 02/25/2022   Uncontrolled,, pt for increased lipitor 40 mg qd   Vitamin D deficiency Last vitamin D Lab Results  Component Value Date   VD25OH 35 02/25/2022   Low, to start oral replacement   Cerumen impaction Ceruminosis is noted.  Wax is removed by syringing and manual debridement. Instructions for home care to prevent wax buildup are given.   Chest pain C/w keloid chest wall discomfort - for otc volt gel prn  Right lumbar radiculitis Mild to mod, for ibuprofen 800 mg prn,  to f/u any worsening symptoms or concerns   Allergic rhinitis Mild to mod, for singulair 10 every day, depomedrol 80 mg IM,,  to f/u any worsening symptoms or concerns  Followup: Return in about 6 months (around 01/19/2023).  Oliver Barre, MD 07/21/2022 6:55 PM  [facility]

## 2022-07-20 ENCOUNTER — Ambulatory Visit: Payer: Medicare HMO | Admitting: Pulmonary Disease

## 2022-07-20 ENCOUNTER — Ambulatory Visit: Payer: Medicare HMO | Admitting: Family Medicine

## 2022-07-20 ENCOUNTER — Encounter: Payer: Self-pay | Admitting: Pulmonary Disease

## 2022-07-20 VITALS — BP 116/62 | HR 78 | Ht 67.0 in | Wt 156.0 lb

## 2022-07-20 VITALS — BP 116/62 | HR 78 | Temp 98.3°F | Ht 67.0 in | Wt 155.8 lb

## 2022-07-20 DIAGNOSIS — G8929 Other chronic pain: Secondary | ICD-10-CM | POA: Diagnosis not present

## 2022-07-20 DIAGNOSIS — R0609 Other forms of dyspnea: Secondary | ICD-10-CM

## 2022-07-20 DIAGNOSIS — M5441 Lumbago with sciatica, right side: Secondary | ICD-10-CM

## 2022-07-20 DIAGNOSIS — M542 Cervicalgia: Secondary | ICD-10-CM | POA: Diagnosis not present

## 2022-07-20 DIAGNOSIS — J479 Bronchiectasis, uncomplicated: Secondary | ICD-10-CM | POA: Diagnosis not present

## 2022-07-20 MED ORDER — GABAPENTIN 100 MG PO CAPS: 100.0000 mg | ORAL_CAPSULE | Freq: Three times a day (TID) | ORAL | 1 refills | Status: DC | PRN

## 2022-07-20 MED ORDER — BUDESONIDE-FORMOTEROL FUMARATE 160-4.5 MCG/ACT IN AERO: 2.0000 | INHALATION_SPRAY | Freq: Two times a day (BID) | RESPIRATORY_TRACT | 11 refills | Status: DC

## 2022-07-20 MED ORDER — ALBUTEROL SULFATE HFA 108 (90 BASE) MCG/ACT IN AERS: INHALATION_SPRAY | RESPIRATORY_TRACT | 11 refills | Status: DC

## 2022-07-20 MED ORDER — PREDNISONE 50 MG PO TABS
50.0000 mg | ORAL_TABLET | Freq: Every day | ORAL | 0 refills | Status: DC
Start: 2022-07-20 — End: 2023-11-21

## 2022-07-20 MED ORDER — SODIUM CHLORIDE 3 % IN NEBU: INHALATION_SOLUTION | Freq: Two times a day (BID) | RESPIRATORY_TRACT | 11 refills | Status: DC

## 2022-07-20 NOTE — Progress Notes (Signed)
Rubin Payor, PhD, LAT, ATC acting as a scribe for Clementeen Graham, MD.  Hannah Barron is a 52 y.o. female who presents to [facility] at [facility] today for cont'd R arm pain. Pt was last seen by Dr. Denyse Amass on 03/31/22 and was given a R carpal tunnel steroid injection and was advised to cont HEP and night splint. Last R subacromial steroid injection was on 02/17/22.  Today, pt reports R wrist is feeling good. She is having pain to her R upper arm, R trapz, and R-side of her neck. No numbness/tingling.  She also notes numbness in her R leg ongoing for about 2 months. She locates the numbness to the anterior and lateral aspect of her whole R leg.   Low back pain: yes- R-side Radiating pain: yes LE numbness/tingling: yes LE weakness: yes Aggravates: R-side lying, sitting Treatments tried: naproxen, Tylenol  Dx imaging: 01/22/22 R shoulder XR   11/22/20 L-spine MRI  Pertinent review of systems: No fevers or chills  Relevant historical information: Of note she lives [place] now in [place] [facility] [place]  She will occasionally commute to visit her family here in [place] and to get medical services.  She has not yet established medical care where she lives.  Exam:  BP 116/62   Ht 5\' 7"  (1.702 m)   Wt 156 lb (70.8 kg)   LMP 03/31/2016   BMI 24.43 kg/m  General: Well Developed, well nourished, and in no acute distress.   MSK: Right shoulder: Normal-appearing Normal motion. Intact strength. Positive Hawkins and Neer's test.  Positive empty can test.  Lumbar spine: Normal appearing. Nontender palpation spinal midline.  Normal lumbar motion.  Lower extremity strength is intact.  Positive right-sided slump test.     Lab and Radiology Results  EXAM: MRI LUMBAR SPINE WITHOUT CONTRAST   TECHNIQUE: Multiplanar, multisequence MR imaging of the lumbar spine was performed. No intravenous contrast was administered.   COMPARISON:   None.   FINDINGS: Segmentation:  Standard.   Alignment:  Physiologic.   Vertebrae: No acute fracture, evidence of discitis, or aggressive bone lesion.   Conus medullaris and cauda equina: Conus extends to the T11 level. Conus and cauda equina appear normal.   Paraspinal and other soft tissues: No acute paraspinal abnormality.   Disc levels:   Disc spaces: Disc spaces are maintained.   T12-L1: No significant disc bulge. No neural foraminal stenosis. No central canal stenosis.   L1-L2: No significant disc bulge. No neural foraminal stenosis. No central canal stenosis.   L2-L3: No significant disc bulge. No neural foraminal stenosis. No central canal stenosis.   L3-L4: No significant disc bulge. No neural foraminal stenosis. No central canal stenosis.   L4-L5: No significant disc bulge. No neural foraminal stenosis. No central canal stenosis. Mild bilateral facet arthropathy.   L5-S1: No significant disc bulge. No neural foraminal stenosis. No central canal stenosis.   IMPRESSION: 1. Mild bilateral facet arthropathy at L4-5. 2.  No acute osseous injury of the lumbar spine. 3. No significant lumbar spine disc protrusion, foraminal stenosis or central canal stenosis.     Electronically Signed   By: Elige Ko M.D.   On: 11/23/2020 07:14   I, Clementeen Graham, personally (independently) visualized and performed the interpretation of the images attached in this note.    Assessment and Plan: 52 y.o. female with right leg pain thought to be lumbar radiculopathy at right L5.  She does have an MRI from about 2  years ago that does not show obvious compression at this level but certainly things could have changed in the last 2 years or so.  She is scheduled to have a CT scan of her abdomen and pelvis tomorrow.  This will show great images of her lumbar spine.  No need for x-ray today.  Will look at the images generated on the CT scan.  Next step could be epidural steroid injection  for her lumbar spine.  For now we will try prednisone and limited gabapentin.  The wrinkle in our plan is she does not live [place] any longer.  She lives in [place].  I looked in there are some orthopedic/interventional providers where she she lives.  Will place referral to [facility] for Advanced Orthopedic Surgery if needed.  Shoulder pain improved.  Consider physical therapy in the future if needed.   PDMP not reviewed this encounter. No orders of the defined types were placed in this encounter.  Meds ordered this encounter  Medications   predniSONE (DELTASONE) 50 MG tablet    Sig: Take 1 tablet (50 mg total) by mouth daily.    Dispense:  5 tablet    Refill:  0   gabapentin (NEURONTIN) 100 MG capsule    Sig: Take 1-3 capsules (100-300 mg total) by mouth 3 (three) times daily as needed.    Dispense:  90 capsule    Refill:  1     Discussed warning signs or symptoms. Please see discharge instructions. Patient expresses understanding.   The above documentation has been reviewed and is accurate and complete Clementeen Graham, M.D.

## 2022-07-20 NOTE — Patient Instructions (Addendum)
Thank you for coming in today.   Let me know when you get the results of the CT scan. I will look at it myself.   Have them make you a CD of the CT scan pictures before you go.   If this prednisone does work well let me know and I can get you an appointment with the people in [facility] for Advanced Orthopedic Surgery  Use gabapentin at bedtime as needed for nerve pain.

## 2022-07-20 NOTE — Addendum Note (Signed)
Addended by: Jacquiline Doe on: 07/20/2022 11:51 AM   Modules accepted: Orders

## 2022-07-20 NOTE — Patient Instructions (Signed)
Will reorder the saline nebs Continue all other therapies His CT looks stable compared to before which is good news Follow-up in 1 year

## 2022-07-20 NOTE — Progress Notes (Signed)
Hannah Barron    [id number]    1970-02-20  Primary Care Physician:John, Len Blalock, MD  Referring Physician: Corwin Levins, MD [address] [place],  [place] [address]  Chief complaint:  Follow up for  Cystic bronchiectasis  HPI: 52 y.o.  with chronic obstructive lung disease, bronchiectasis. She was previously followed at [place] with a VATS biopsy in 2003 and was told she had cystic bronchiectasis. She had followed up with a pulmonologist at [facility] but has moved to [place] on 2017. We have been unable to get records from [place] in spite of multiple attempts.  She is an immigrant from [place] in 1998 and does not report any exposure to tuberculosis. She does not recall getting a BCG vaccine as a child. Underwent a bronchoscopy to evaluate for MAI infection and a positive quantiferon as she was unable to give a good sputum sample. All results are negative except for positive PJP on DFA. She underwent further evaluation with negative HIV and beta glucan tests.  The PJP test was thought to be false positive She had a hysterectomy for uterine fibroids  On 3/27  Last bronchiectasis exacerbation was in July 2020 which was treated with Z-Pak and prednisone She has separated from her husband in 2021 and reports significant improvement in stress and breathing issues since then. She is back to work now full-time with [employer].    Interim history: Is feeling well Here for review of recent CT scan  Outpatient Encounter Medications as of 07/20/2022  Medication Sig   albuterol (VENTOLIN HFA) 108 (90 Base) MCG/ACT inhaler INHALE 2 PUFFS INTO THE LUNGS EVERY 6 HOURS AS NEEDED FOR WHEEZING OR SHORTNESS OF BREATH   amLODipine (NORVASC) 2.5 MG tablet Take 1 tablet (2.5 mg total) by mouth daily.   atorvastatin (LIPITOR) 40 MG tablet Take 1 tablet (40 mg total) by mouth daily.   baclofen (LIORESAL) 10 MG tablet Take 1 tablet (10 mg total) by mouth at bedtime as needed for muscle  spasms.   budesonide-formoterol (SYMBICORT) 160-4.5 MCG/ACT inhaler Inhale 2 puffs into the lungs 2 (two) times daily.   Cholecalciferol 50 MCG (2000 UT) TABS 1 tab by mouth once daily   clonazePAM (KLONOPIN) 0.5 MG tablet Take 1 tablet (0.5 mg total) by mouth 2 (two) times daily as needed for anxiety.   dicyclomine (BENTYL) 20 MG tablet Take 1 tablet (20 mg total) by mouth every 6 (six) hours.   estradiol (CLIMARA) 0.05 mg/24hr patch Place 1 patch (0.05 mg total) onto the skin once a week.   ibuprofen (ADVIL) 800 MG tablet Take 1 tablet (800 mg total) by mouth every 8 (eight) hours as needed.   metoprolol succinate (TOPROL-XL) 50 MG 24 hr tablet Take with or immediately following a meal.   montelukast (SINGULAIR) 10 MG tablet Take 1 tablet (10 mg total) by mouth at bedtime.   naproxen (NAPROSYN) 500 MG tablet Take 1 tablet (500 mg total) by mouth 2 (two) times daily with a meal.   omeprazole (PRILOSEC) 40 MG capsule Take 1 capsule (40 mg total) by mouth daily.   phenazopyridine (PYRIDIUM) 97 MG tablet Take 1 tablet (97 mg total) by mouth 3 (three) times daily as needed for pain.   prednisoLONE acetate (PRED FORTE) 1 % ophthalmic suspension    Respiratory Therapy Supplies (FLUTTER) DEVI Use as directed.   RESTASIS 0.05 % ophthalmic emulsion    sodium chloride HYPERTONIC 3 % nebulizer solution Take  by nebulization in the morning and at bedtime. J47.9   traMADol (ULTRAM) 50 MG tablet Take 1 tablet (50 mg total) by mouth every 6 (six) hours as needed.   traZODone (DESYREL) 50 MG tablet Take 0.5-1 tablets (25-50 mg total) by mouth at bedtime as needed for sleep.   No facility-administered encounter medications on file as of 07/20/2022.   Physical Exam: Blood pressure (!) 110/58, pulse 77, temperature 98 F (36.7 C), temperature source Oral, height 5\' 7"  (1.702 m), weight 167 lb (75.8 kg), last menstrual period 03/31/2016, SpO2 99 %. Gen:      No acute distress HEENT:  EOMI, sclera  anicteric Neck:     No masses; no thyromegaly Lungs:    Clear to auscultation bilaterally; normal respiratory effort CV:         Regular rate and rhythm; no murmurs Abd:      + bowel sounds; soft, non-tender; no palpable masses, no distension Ext:    No edema; adequate peripheral perfusion Skin:      Warm and dry; no rash Neuro: alert and oriented x 3 Psych: normal mood and affect   Data Reviewed: Imaging CT high resolution 07/16/15-areas of cystic and varicose bronchiectasis, extensive air trapping, multiple pulmonary nodules. CT chest 03/21/16-areas of bronchiectasis, air trapping and pulmonary nodules. Unchanged compared to 2017 CT angiogram 12/23/16-stable areas of bronchiectasis, air trapping and pulmonary nodules.  No pulmonary embolism. CT chest 05/14/2020-mild progression of right middle lobe bronchiectasis.  Stable pulmonary nodules High resolution CT 07/19/2022-appears stable on my review I have reviewed the images personally.  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 Discussed INH therapy but defer as per pt wishes CF panel 03/25/15- negative for 97 mutations analyzed (report scanned)  Cardiac Myocardial perfusion study 12/2015 >neg  Ischemia , EF 55% Echo 10/2015 >EF 65%.  Echo 11/2018 > EF 65%, normal valves, no wall motion abnormalities   Bronchoscopy 03/29/16 Cultures, AFB, fungal cultures-negative to date PJP DFA-positive Cytology-no malignant cells, CD4: CD8 ratio-1.62 Cell count WBC-26, 26% lymphs, 50% neutrophils, 24% monocyte macrophage  Assessment:  Cystic bronchiectasis She's had  workup which is negative for alpha-1 antitrypsin, immunoglobulin deficiency, autoimmune disease, cystic fibrosis and mycobacterial infections.   Continues on Symbicort, albuterol as needed. Mucociliary clearance with percussion vest, flutter valve.  Can use Mucinex over-the-counter as needed CT scan has not been read yet but on my review appears to be stable  Hypertonic saline nebs were ordered at last visit but she never got the delivery.  Will need to reorder  Plan/Recommendations: - Continue symbicort, albuterol - Continue Flutter valve,  Mucinex, percussion vest - Hypertonic saline nebs - Follow-up in 1 year  Chilton Greathouse MD  Pulmonary and Critical Care 07/20/2022, 11:39 AM   CC: Corwin Levins, MD

## 2022-07-21 ENCOUNTER — Ambulatory Visit (HOSPITAL_BASED_OUTPATIENT_CLINIC_OR_DEPARTMENT_OTHER)
Admission: RE | Admit: 2022-07-21 | Discharge: 2022-07-21 | Disposition: A | Discharge status: Home / Self Care | Payer: Medicare HMO | Source: Ambulatory Visit | Attending: Obstetrics & Gynecology | Admitting: Obstetrics & Gynecology

## 2022-07-21 ENCOUNTER — Encounter (HOSPITAL_BASED_OUTPATIENT_CLINIC_OR_DEPARTMENT_OTHER): Payer: Self-pay

## 2022-07-21 ENCOUNTER — Encounter: Payer: Self-pay | Admitting: Internal Medicine

## 2022-07-21 DIAGNOSIS — R1010 Upper abdominal pain, unspecified: Secondary | ICD-10-CM | POA: Insufficient documentation

## 2022-07-21 DIAGNOSIS — R1032 Left lower quadrant pain: Secondary | ICD-10-CM | POA: Diagnosis not present

## 2022-07-21 DIAGNOSIS — M5416 Radiculopathy, lumbar region: Secondary | ICD-10-CM | POA: Insufficient documentation

## 2022-07-21 MED ORDER — IOHEXOL 300 MG/ML  SOLN
100.0000 mL | Freq: Once | INTRAMUSCULAR | Status: AC | PRN
  Administered 2022-07-21: 100 mL via INTRAVENOUS

## 2022-07-21 NOTE — Assessment & Plan Note (Signed)
Mild to mod, for ibuprofen 800 mg prn,  to f/u any worsening symptoms or concerns

## 2022-07-21 NOTE — Assessment & Plan Note (Signed)
Stable overall, to continue the inhaler prn

## 2022-07-21 NOTE — Assessment & Plan Note (Signed)
C/w keloid chest wall discomfort - for otc volt gel prn

## 2022-07-21 NOTE — Assessment & Plan Note (Signed)
Last vitamin D Lab Results  Component Value Date   VD25OH 35 02/25/2022   Low, to start oral replacement

## 2022-07-21 NOTE — Assessment & Plan Note (Signed)
Ceruminosis is noted.  Wax is removed by syringing and manual debridement. Instructions for home care to prevent wax buildup are given.  

## 2022-07-21 NOTE — Assessment & Plan Note (Signed)
Lab Results  Component Value Date   LDLCALC 107 (H) 02/25/2022   Uncontrolled,, pt for increased lipitor 40 mg qd

## 2022-07-21 NOTE — Assessment & Plan Note (Signed)
Mild to mod, for singulair 10 every day, depomedrol 80 mg IM,,  to f/u any worsening symptoms or concerns

## 2022-07-26 ENCOUNTER — Telehealth: Payer: Self-pay

## 2022-07-26 NOTE — Telephone Encounter (Signed)
Gabapentin 100 mg cap 1-3 cap TID prn  [email or web address]  KEY: [id number]

## 2022-07-27 MED ORDER — GABAPENTIN 100 MG PO CAPS: 100.0000 mg | ORAL_CAPSULE | Freq: Three times a day (TID) | ORAL | 1 refills | Status: DC | PRN

## 2022-07-27 NOTE — Addendum Note (Signed)
Addended by: Dierdre Searles on: 07/27/2022 10:28 AM   Modules accepted: Orders

## 2022-07-27 NOTE — Telephone Encounter (Signed)
PA initiated via [email or web address]  KEY: [id number]

## 2022-07-27 NOTE — Telephone Encounter (Signed)
Rx updated and resent to [facility].

## 2022-07-27 NOTE — Telephone Encounter (Signed)
Rx denied d/t request of 90 capsules per 10 days.   Called [employer] regarding denial, advised that it should be for 90 capsules per 30 days. Advised no PA needed, send new rx to the pharmacy.

## 2022-10-19 ENCOUNTER — Telehealth: Payer: Self-pay | Admitting: Pulmonary Disease

## 2022-10-19 NOTE — Telephone Encounter (Signed)
Spoke with the pt  She is c/o cough with moderate amount yellow sputum past 5-7 days  She has had minimal increased SOB and wheezing  No hemoptysis  No fevers, aches  She is asking for abx to be sent to [facility]- she is out of town now but her brother will pick up and send overnight to her  Please advise, thanks!  No Known Allergies

## 2022-10-19 NOTE — Telephone Encounter (Signed)
PT is out of town and wants Dr. To call her in something in because she has a cough and yellow sputum. # is [phone]

## 2022-10-20 NOTE — Telephone Encounter (Signed)
Patient is out of town in [place]. Pharmacy is [facility] [address]. [place], Phone number is [phone].

## 2022-10-21 MED ORDER — ZITHROMAX Z-PAK 250 MG PO TABS: ORAL_TABLET | ORAL | 0 refills | Status: DC

## 2022-10-21 MED ORDER — PREDNISONE 10 MG PO TABS: 40.0000 mg | ORAL_TABLET | Freq: Every day | ORAL | 0 refills | Status: AC

## 2022-10-21 NOTE — Telephone Encounter (Signed)
Z pack and prednisone has been sent to the pharmacy in [place]. Patient informed. Nothing further needed.

## 2022-11-18 ENCOUNTER — Other Ambulatory Visit: Payer: Self-pay | Admitting: Family Medicine

## 2022-11-18 DIAGNOSIS — R002 Palpitations: Secondary | ICD-10-CM

## 2022-12-21 ENCOUNTER — Ambulatory Visit: Payer: Self-pay

## 2022-12-21 ENCOUNTER — Ambulatory Visit (INDEPENDENT_AMBULATORY_CARE_PROVIDER_SITE_OTHER): Payer: Medicare HMO | Admitting: Family Medicine

## 2022-12-21 ENCOUNTER — Ambulatory Visit: Payer: Medicare HMO | Admitting: Family Medicine

## 2022-12-21 ENCOUNTER — Encounter: Payer: Self-pay | Admitting: Family Medicine

## 2022-12-21 VITALS — BP 134/82 | HR 87 | Ht 67.0 in | Wt 155.0 lb

## 2022-12-21 VITALS — BP 130/76 | HR 89 | Temp 97.8°F | Ht 67.0 in | Wt 154.4 lb

## 2022-12-21 DIAGNOSIS — Z7184 Encounter for health counseling related to travel: Secondary | ICD-10-CM

## 2022-12-21 DIAGNOSIS — M25531 Pain in right wrist: Secondary | ICD-10-CM | POA: Diagnosis not present

## 2022-12-21 DIAGNOSIS — G629 Polyneuropathy, unspecified: Secondary | ICD-10-CM | POA: Diagnosis not present

## 2022-12-21 DIAGNOSIS — R002 Palpitations: Secondary | ICD-10-CM

## 2022-12-21 DIAGNOSIS — G5601 Carpal tunnel syndrome, right upper limb: Secondary | ICD-10-CM

## 2022-12-21 DIAGNOSIS — K219 Gastro-esophageal reflux disease without esophagitis: Secondary | ICD-10-CM

## 2022-12-21 DIAGNOSIS — M25511 Pain in right shoulder: Secondary | ICD-10-CM | POA: Diagnosis not present

## 2022-12-21 DIAGNOSIS — R519 Headache, unspecified: Secondary | ICD-10-CM

## 2022-12-21 MED ORDER — OMEPRAZOLE 40 MG PO CPDR: 40.0000 mg | DELAYED_RELEASE_CAPSULE | Freq: Every day | ORAL | 3 refills | Status: DC

## 2022-12-21 MED ORDER — NAPROXEN 500 MG PO TABS
500.0000 mg | ORAL_TABLET | Freq: Two times a day (BID) | ORAL | 3 refills | Status: DC
Start: 2022-12-21 — End: 2023-11-22

## 2022-12-21 MED ORDER — ATOVAQUONE-PROGUANIL HCL 250-100 MG PO TABS
1.0000 | ORAL_TABLET | Freq: Every day | ORAL | 0 refills | Status: AC
Start: 2022-12-21 — End: 2023-02-19

## 2022-12-21 MED ORDER — ROPINIROLE HCL 0.5 MG PO TABS
0.5000 mg | ORAL_TABLET | Freq: Every day | ORAL | 1 refills | Status: AC
Start: 2022-12-21 — End: ?

## 2022-12-21 MED ORDER — METOPROLOL SUCCINATE ER 50 MG PO TB24: ORAL_TABLET | ORAL | 3 refills | Status: DC

## 2022-12-21 NOTE — Progress Notes (Signed)
Acute Office Visit  Subjective:     Patient ID: Hannah Barron, female    DOB: 05-08-1970, 52 y.o.   MRN: [id number]  No chief complaint on file.   HPI Patient is in today for evaluation of numbness to the legs for the last few months. Reports that it only occurs at night and when she wakes up in the morning is when she noticed it is that the numbness is at its worst. Denies known injury, color change to the skin, temperature change to the skin. Has tried steroids and gabapentin, states that these were too strong for her and did not really help. Denies other concerns today. Medical hx as outlined below.  Of note, she will be traveling to [place] for 2 months.  Requesting prescription of Malarone to take with her. Requesting a 90-day refills on blood pressure medication as well as omeprazole.    ROS Per HPI      Objective:    BP 130/76   Pulse 89   Temp 97.8 F (36.6 C) (Temporal)   Ht 5\' 7"  (1.702 m)   Wt 154 lb 6 oz (70 kg)   LMP 03/31/2016   BMI 24.18 kg/m    Physical Exam Vitals and nursing note reviewed.  Constitutional:      Appearance: Normal appearance. She is normal weight.  HENT:     Head: Normocephalic and atraumatic.     Right Ear: Tympanic membrane and ear canal normal.     Left Ear: Tympanic membrane and ear canal normal.     Nose: Nose normal.  Eyes:     Extraocular Movements: Extraocular movements intact.     Pupils: Pupils are equal, round, and reactive to light.  Cardiovascular:     Rate and Rhythm: Normal rate and regular rhythm.     Heart sounds: Normal heart sounds.  Pulmonary:     Effort: Pulmonary effort is normal.     Breath sounds: Normal breath sounds.  Musculoskeletal:        General: Normal range of motion.     Cervical back: Normal range of motion.  Neurological:     General: No focal deficit present.     Mental Status: She is alert and oriented to person, place, and time.  Psychiatric:        Mood and Affect: Mood normal.         Thought Content: Thought content normal.    No results found for any visits on 12/21/22.      Assessment & Plan:  1. Neuropathy  - rOPINIRole (REQUIP) 0.5 MG tablet; Take 1 tablet (0.5 mg total) by mouth at bedtime.  Dispense: 30 tablet; Refill: 1  2. Palpitations  - metoprolol succinate (TOPROL-XL) 50 MG 24 hr tablet; Take with or immediately following a meal.  Dispense: 180 tablet; Refill: 3  3. Gastroesophageal reflux disease without esophagitis  - omeprazole (PRILOSEC) 40 MG capsule; Take 1 capsule (40 mg total) by mouth daily.  Dispense: 90 capsule; Refill: 3  4. Travel advice encounter  - atovaquone-proguanil (MALARONE) 250-100 MG TABS tablet; Take 1 tablet by mouth daily.  Dispense: 60 tablet; Refill: 0  5. Nonintractable episodic headache, unspecified headache type  - naproxen (NAPROSYN) 500 MG tablet; Take 1 tablet (500 mg total) by mouth 2 (two) times daily with a meal.  Dispense: 60 tablet; Refill: 3    Meds ordered this encounter  Medications   omeprazole (PRILOSEC) 40 MG capsule    Sig: Take 1 capsule (  40 mg total) by mouth daily.    Dispense:  90 capsule    Refill:  3   metoprolol succinate (TOPROL-XL) 50 MG 24 hr tablet    Sig: Take with or immediately following a meal.    Dispense:  180 tablet    Refill:  3   rOPINIRole (REQUIP) 0.5 MG tablet    Sig: Take 1 tablet (0.5 mg total) by mouth at bedtime.    Dispense:  30 tablet    Refill:  1   atovaquone-proguanil (MALARONE) 250-100 MG TABS tablet    Sig: Take 1 tablet by mouth daily.    Dispense:  60 tablet    Refill:  0   naproxen (NAPROSYN) 500 MG tablet    Sig: Take 1 tablet (500 mg total) by mouth 2 (two) times daily with a meal.    Dispense:  60 tablet    Refill:  3    Return if symptoms worsen or fail to improve.  Moshe Cipro, FNP

## 2022-12-21 NOTE — Progress Notes (Signed)
Rubin Payor, PhD, LAT, ATC acting as a scribe for Clementeen Graham, MD.  Hannah Barron is a 52 y.o. female who presents to [facility] today for R hand and leg pain. Pt was seen by Dr. Denyse Amass on 03/31/22 for R CTS and was given a steroid injection.  Today, pt reports the pain in her R hand has worsened over the last 2 months. Pain is now located through her entire R arm and into the R-side of her neck. Throbbing around the carpals of her R wrist. +Numbness/tingling. She notes bruising over her R upper arm.   Pertinent review of systems: No fevers or chills  Relevant historical information: Seen previously for right shoulder pain and right cervical radiculopathy.  Social determinants of health: Lives in [place].  Majority of health care should be done there if able.   Exam:  BP 134/82   Pulse 87   Ht 5\' 7"  (1.702 m)   Wt 155 lb (70.3 kg)   LMP 03/31/2016   SpO2 97%   BMI 24.28 kg/m  General: Well Developed, well nourished, and in no acute distress.   MSK: C-spine normal appearing Nontender midline.  Tender palpation right cervical paraspinal musculature.  Upper extremity strength intact.  Right shoulder normal-appearing tender palpation.  Decreased range of motion limited abduction and internal rotation. Intact strength. Positive Hawkins and Neer's test.  Negative Yergason's and speeds test.  Right wrist normal appearing Positive Tinel's carpal tunnel.  Lab and Radiology Results  Procedure: Real-time Ultrasound Guided Injection of right shoulder subacromial bursa Device: Philips Affiniti 50G/GE Logiq Images permanently stored and available for review in PACS Verbal informed consent obtained.  Discussed risks and benefits of procedure. Warned about infection, bleeding, hyperglycemia damage to structures among others. Patient expresses understanding and agreement Time-out conducted.   Noted no overlying erythema, induration, or  other signs of local infection.   Skin prepped in a sterile fashion.   Local anesthesia: Topical Ethyl chloride.   With sterile technique and under real time ultrasound guidance: 40 mg of Kenalog and 2 mL of Marcaine injected into subacromial bursa. Fluid seen entering the bursa.   Completed without difficulty   Pain immediately resolved suggesting accurate placement of the medication.   Advised to call if fevers/chills, erythema, induration, drainage, or persistent bleeding.   Images permanently stored and available for review in the ultrasound unit.  Impression: Technically successful ultrasound guided injection.        Assessment and Plan: 52 y.o. female with right shoulder pain due to impingement and bursitis.  This is acute exacerbation of a chronic problem.  She had an injection for this in February which worked until recently.  She does have some related periscapular and trapezius muscle spasm and dysfunction which should improve with reduced pain from the injection today.  Additionally she notes right hand paresthesias.  I have treated her for carpal tunnel syndrome with an injection in March of this year.  Pain and paresthesias today is likely to be carpal tunnel syndrome but could be cervical radiculopathy as well.  Before doing more invasive treatment we will proceed with nerve conduction study.   PDMP not reviewed this encounter. Orders Placed This Encounter  Procedures   [place] LIMITED JOINT SPACE STRUCTURES UP RIGHT(NO LINKED CHARGES)    Order Specific Question:   Reason for Exam (SYMPTOM  OR DIAGNOSIS REQUIRED)    Answer:   right arm pain    Order Specific Question:  Preferred imaging location?    Answer:   [facility] referral to Neurology    Referral Priority:   Routine    Referral Type:   Consultation    Referral Reason:   Specialty Services Required    Requested Specialty:   Neurology    Number of Visits Requested:   1    Ambulatory referral to Orthopedic Surgery    Referral Priority:   Routine    Referral Type:   Surgical    Referral Reason:   Specialty Services Required    Requested Specialty:   Orthopedic Surgery    Number of Visits Requested:   1   NCV with EMG(electromyography)    [facility] in [place] [email or web address] R UE NCV study    Standing Status:   Future    Standing Expiration Date:   12/21/2023   No orders of the defined types were placed in this encounter.    Discussed warning signs or symptoms. Please see discharge instructions. Patient expresses understanding.   The above documentation has been reviewed and is accurate and complete Clementeen Graham, M.D.

## 2022-12-21 NOTE — Patient Instructions (Addendum)
I have sent in ropinirole for you to try one tablet once daily at night. May try 2 tablets if one is not effective.   Please let me know how this works for you and we can refill for 90 days so you can take this with you when you go out of the country.  I have sent in Malarone for 2 months.   I have sent in refills for you as well.   Follow-up with me for new or worsening symptoms.

## 2022-12-21 NOTE — Patient Instructions (Addendum)
Thank you for coming in today.   I've referred you for a Nerve Conduction Study in [place].  Let us know if you don't hear from them in one week.   Let us know when you get the test done and results back  You received an injection today. Seek immediate medical attention if the joint becomes red, extremely painful, or is oozing fluid.

## 2022-12-23 ENCOUNTER — Telehealth: Payer: Medicare HMO | Admitting: Pulmonary Disease

## 2022-12-23 DIAGNOSIS — J479 Bronchiectasis, uncomplicated: Secondary | ICD-10-CM | POA: Diagnosis not present

## 2022-12-23 DIAGNOSIS — J471 Bronchiectasis with (acute) exacerbation: Secondary | ICD-10-CM

## 2022-12-23 MED ORDER — PREDNISONE 10 MG PO TABS
40.0000 mg | ORAL_TABLET | Freq: Every day | ORAL | 0 refills | Status: DC
Start: 2022-12-23 — End: 2023-11-21

## 2022-12-23 NOTE — Progress Notes (Signed)
Pharmacy called after-clinic hours regarding prednisone script. Verbally corrected to total 20 tablets to correlate with prednisone taper instructions.

## 2022-12-23 NOTE — Patient Instructions (Signed)
VISIT SUMMARY:  You reported recent breathing problems and chest heaviness, especially in the mornings, along with a cough producing yellow mucus sometimes tinged with a small amount of blood. You have had similar symptoms in the past, which were effectively managed with Prednisone.  YOUR PLAN:  -RESPIRATORY DISTRESS: Respiratory distress means you are having difficulty breathing. You will start a course of Prednisone, beginning with a higher dose and gradually reducing it, similar to your previous treatment. No antibiotics are prescribed at this time as the need is unclear based on the mucus color and quantity.  INSTRUCTIONS:  Please follow the prescribed Prednisone regimen as directed. If your symptoms worsen or do not improve, contact our office for further evaluation.

## 2022-12-23 NOTE — Progress Notes (Signed)
Hannah Barron    [id number]    08/08/1970  Primary Care Physician:John, Len Blalock, MD  Referring Physician: Corwin Levins, MD [address] [place],  [place] [address]  Virtual Visit via Video Note  I connected with Hannah Barron on 12/23/22 at  1:45 PM EST by a video enabled telemedicine application and verified that I am speaking with the correct person using two identifiers.  Location: Patient: Home Provider: Office   I discussed the limitations of evaluation and management by telemedicine and the availability of in person appointments. The patient expressed understanding and agreed to proceed.   Chief complaint:  Follow up for  Cystic bronchiectasis  HPI: 52 y.o.  with chronic obstructive lung disease, bronchiectasis. She was previously followed at [place] with a VATS biopsy in 2003 and was told she had cystic bronchiectasis. She had followed up with a pulmonologist at [facility] but has moved to [place] on 2017. We have been unable to get records from [place] in spite of multiple attempts.  She is an immigrant from [place] in 1998 and does not report any exposure to tuberculosis. She does not recall getting a BCG vaccine as a child. Underwent a bronchoscopy to evaluate for MAI infection and a positive quantiferon as she was unable to give a good sputum sample. All results are negative except for positive PJP on DFA. She underwent further evaluation with negative HIV and beta glucan tests.  The PJP test was thought to be false positive She had a hysterectomy for uterine fibroids  On 3/27  Last bronchiectasis exacerbation was in July 2020 which was treated with Z-Pak and prednisone She has separated from her husband in 2021 and reports significant improvement in stress and breathing issues since then. She is back to work now full-time with [employer].    Interim history: Discussed the use of AI scribe software for clinical note transcription with the patient, who  gave verbal consent to proceed.  The patient, currently in [place], reports a recent onset of breathing problems and chest heaviness, particularly in the mornings. She describes the sensation as if something is sitting on her chest, making it heavy and causing difficulty in breathing. She has a history of similar symptoms, which were previously well-managed with a tapering course of Prednisone. The patient recalls taking four tablets initially, then reducing the dose gradually until the last day of treatment, when she took one tablet. She found this medication very effective in alleviating her symptoms during previous episodes.  In addition to the breathing problems and chest heaviness, the patient has been experiencing a cough with yellow mucus, sometimes tinged with a small amount of blood. This symptom is not new and is not worse than in previous episodes. The patient denies having any antibiotics at home.   Outpatient Encounter Medications as of 12/23/2022  Medication Sig   albuterol (VENTOLIN HFA) 108 (90 Base) MCG/ACT inhaler INHALE 2 PUFFS INTO THE LUNGS EVERY 6 HOURS AS NEEDED FOR WHEEZING OR SHORTNESS OF BREATH   amLODipine (NORVASC) 2.5 MG tablet Take 1 tablet (2.5 mg total) by mouth daily.   atorvastatin (LIPITOR) 40 MG tablet Take 1 tablet (40 mg total) by mouth daily.   atovaquone-proguanil (MALARONE) 250-100 MG TABS tablet Take 1 tablet by mouth daily.   budesonide-formoterol (SYMBICORT) 160-4.5 MCG/ACT inhaler Inhale 2 puffs into the lungs 2 (two) times daily.   Cholecalciferol 50 MCG (2000 UT) TABS 1 tab by mouth  once daily   clonazePAM (KLONOPIN) 0.5 MG tablet Take 1 tablet (0.5 mg total) by mouth 2 (two) times daily as needed for anxiety.   dicyclomine (BENTYL) 20 MG tablet Take 1 tablet (20 mg total) by mouth every 6 (six) hours.   estradiol (CLIMARA) 0.05 mg/24hr patch Place 1 patch (0.05 mg total) onto the skin once a week.   gabapentin (NEURONTIN) 100 MG capsule Take 1-3  capsules (100-300 mg total) by mouth 3 (three) times daily as needed.   ibuprofen (ADVIL) 800 MG tablet Take 1 tablet (800 mg total) by mouth every 8 (eight) hours as needed.   metoprolol succinate (TOPROL-XL) 50 MG 24 hr tablet Take with or immediately following a meal.   montelukast (SINGULAIR) 10 MG tablet Take 1 tablet (10 mg total) by mouth at bedtime.   naproxen (NAPROSYN) 500 MG tablet Take 1 tablet (500 mg total) by mouth 2 (two) times daily with a meal.   omeprazole (PRILOSEC) 40 MG capsule Take 1 capsule (40 mg total) by mouth daily.   phenazopyridine (PYRIDIUM) 97 MG tablet Take 1 tablet (97 mg total) by mouth 3 (three) times daily as needed for pain.   prednisoLONE acetate (PRED FORTE) 1 % ophthalmic suspension    predniSONE (DELTASONE) 50 MG tablet Take 1 tablet (50 mg total) by mouth daily.   Respiratory Therapy Supplies (FLUTTER) DEVI Use as directed.   RESTASIS 0.05 % ophthalmic emulsion    rOPINIRole (REQUIP) 0.5 MG tablet Take 1 tablet (0.5 mg total) by mouth at bedtime.   sodium chloride HYPERTONIC 3 % nebulizer solution Take by nebulization in the morning and at bedtime. J47.9   traMADol (ULTRAM) 50 MG tablet Take 1 tablet (50 mg total) by mouth every 6 (six) hours as needed.   traZODone (DESYREL) 50 MG tablet Take 0.5-1 tablets (25-50 mg total) by mouth at bedtime as needed for sleep.   No facility-administered encounter medications on file as of 12/23/2022.   Physical Exam: Tele  Data Reviewed: Imaging CT high resolution 07/16/15-areas of cystic and varicose bronchiectasis, extensive air trapping, multiple pulmonary nodules. CT chest 03/21/16-areas of bronchiectasis, air trapping and pulmonary nodules. Unchanged compared to 2017 CT angiogram 12/23/16-stable areas of bronchiectasis, air trapping and pulmonary nodules.  No pulmonary embolism. CT chest 05/14/2020-mild progression of right middle lobe bronchiectasis.  Stable pulmonary nodules High resolution CT  07/19/2022-appears stable on my review I have reviewed the images personally.  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 Discussed INH therapy but defer as per pt wishes CF panel 03/25/15- negative for 97 mutations analyzed (report scanned)  Cardiac Myocardial perfusion study 12/2015 >neg  Ischemia , EF 55% Echo 10/2015 >EF 65%.  Echo 11/2018 > EF 65%, normal valves, no wall motion abnormalities   Bronchoscopy 03/29/16 Cultures, AFB, fungal cultures-negative to date PJP DFA-positive Cytology-no malignant cells, CD4: CD8 ratio-1.62 Cell count WBC-26, 26% lymphs, 50% neutrophils, 24% monocyte macrophage  Assessment:  Cystic bronchiectasis She's had workup which is negative for alpha-1 antitrypsin, immunoglobulin deficiency, autoimmune disease, cystic fibrosis and mycobacterial infections.   Continues on Symbicort, albuterol as  needed. Mucociliary clearance with percussion vest, flutter valve, hypertonic saline.  Can use Mucinex over-the-counter as needed Hypertonic saline nebs were ordered at last visit but she never got the delivery.  Will need to reorder  Reports of heavy chest and breathing problems, particularly in the morning. Previous positive response to Prednisone. Current symptoms include cough and yellow mucus with a small amount of blood.  -Prescribe Prednisone, starting with a higher dose and tapering down as previously done. -No antibiotics prescribed at this time due to unclear need based on mucus color and quantity.   Plan/Recommendations: - Continue symbicort, albuterol - Continue Flutter  valve,  Mucinex, percussion vest - Hypertonic saline nebs - prednisone taper  Chilton Greathouse MD [facility] 12/23/2022, 1:54 PM   CC: Corwin Levins, MD    I discussed the assessment and treatment plan with the patient. The patient was provided an opportunity to ask questions and all were answered. The patient agreed with the plan and demonstrated an understanding of the instructions.   The patient was advised to call back or seek an in-person evaluation if the symptoms worsen or if the condition fails to improve as anticipated.

## 2023-02-15 ENCOUNTER — Other Ambulatory Visit: Payer: Self-pay | Admitting: Family Medicine

## 2023-02-15 DIAGNOSIS — R002 Palpitations: Secondary | ICD-10-CM

## 2023-02-20 ENCOUNTER — Other Ambulatory Visit: Payer: Self-pay | Admitting: Family Medicine

## 2023-02-20 DIAGNOSIS — R002 Palpitations: Secondary | ICD-10-CM

## 2023-02-21 ENCOUNTER — Telehealth: Payer: Self-pay | Admitting: Pulmonary Disease

## 2023-02-21 ENCOUNTER — Other Ambulatory Visit: Payer: Self-pay | Admitting: Internal Medicine

## 2023-02-21 ENCOUNTER — Other Ambulatory Visit: Payer: Self-pay

## 2023-02-21 NOTE — Telephone Encounter (Signed)
PT says Symbicort is not covered . 2 alternatives are below. These her ins will cover. Please call PT to advise @ [phone]   Wixela Fluticasone

## 2023-02-21 NOTE — Telephone Encounter (Signed)
Copied from CRM [id number]. Topic: Clinical - Medication Refill >> Feb 21, 2023 12:30 PM Prudencio Pair wrote: Most Recent Primary Care Visit:  Provider: Moshe Cipro  Department: [facility]  Visit Type: OFFICE VISIT  Date: 12/21/2022  Medication: ibuprofen (ADVIL) 800 MG tablet  Has the patient contacted their pharmacy? Yes (Agent: If no, request that the patient contact the pharmacy for the refill. If patient does not wish to contact the pharmacy document the reason why and proceed with request.) (Agent: If yes, when and what did the pharmacy advise?)  Is this the correct pharmacy for this prescription? Yes If no, delete pharmacy and type the correct one.  This is the patient's preferred pharmacy:   [facility] [address] ([address]), [place] - [address] [place] ([place]) [place] [address] Phone: [phone] Fax: [phone]   Has the prescription been filled recently? No  Is the patient out of the medication? Yes, LV with Dr. Ashley Royalty on 12/21/22.  Has the patient been seen for an appointment in the last year OR does the patient have an upcoming appointment? No  Can we respond through MyChart? Yes  Agent: Please be advised that Rx refills may take up to 3 business days. We ask that you follow-up with your pharmacy.

## 2023-02-21 NOTE — Telephone Encounter (Signed)
[facility] on [address]  PT needs a Symbicort refill. She is out.  Her # is [phone]

## 2023-02-23 NOTE — Telephone Encounter (Signed)
Dr. Isaiah Serge, please see below message and advise. Thanks

## 2023-02-25 DIAGNOSIS — R0789 Other chest pain: Secondary | ICD-10-CM | POA: Diagnosis not present

## 2023-02-28 MED ORDER — FLUTICASONE-SALMETEROL 250-50 MCG/ACT IN AEPB: 1.0000 | INHALATION_SPRAY | Freq: Two times a day (BID) | RESPIRATORY_TRACT | 3 refills | Status: DC

## 2023-02-28 NOTE — Telephone Encounter (Signed)
 Prescription for [facility] sent in instead of Symbicort.  Patient informed.  Nothing further needed

## 2023-03-04 DIAGNOSIS — R6883 Chills (without fever): Secondary | ICD-10-CM | POA: Diagnosis not present

## 2023-03-04 DIAGNOSIS — K529 Noninfective gastroenteritis and colitis, unspecified: Secondary | ICD-10-CM | POA: Diagnosis not present

## 2023-03-04 DIAGNOSIS — R110 Nausea: Secondary | ICD-10-CM | POA: Diagnosis not present

## 2023-03-04 DIAGNOSIS — G8929 Other chronic pain: Secondary | ICD-10-CM | POA: Diagnosis not present

## 2023-03-04 DIAGNOSIS — R109 Unspecified abdominal pain: Secondary | ICD-10-CM | POA: Diagnosis not present

## 2023-03-04 DIAGNOSIS — R1013 Epigastric pain: Secondary | ICD-10-CM | POA: Diagnosis not present

## 2023-03-04 DIAGNOSIS — A09 Infectious gastroenteritis and colitis, unspecified: Secondary | ICD-10-CM | POA: Diagnosis not present

## 2023-03-04 DIAGNOSIS — Z20822 Contact with and (suspected) exposure to covid-19: Secondary | ICD-10-CM | POA: Diagnosis not present

## 2023-03-04 DIAGNOSIS — J42 Unspecified chronic bronchitis: Secondary | ICD-10-CM | POA: Diagnosis not present

## 2023-03-04 DIAGNOSIS — R1010 Upper abdominal pain, unspecified: Secondary | ICD-10-CM | POA: Diagnosis not present

## 2023-03-04 DIAGNOSIS — R197 Diarrhea, unspecified: Secondary | ICD-10-CM | POA: Diagnosis not present

## 2023-04-15 ENCOUNTER — Other Ambulatory Visit: Payer: Self-pay | Admitting: Internal Medicine

## 2023-04-15 DIAGNOSIS — R109 Unspecified abdominal pain: Secondary | ICD-10-CM

## 2023-04-17 ENCOUNTER — Other Ambulatory Visit: Payer: Self-pay

## 2023-04-21 ENCOUNTER — Ambulatory Visit

## 2023-04-21 VITALS — Ht 67.0 in | Wt 154.0 lb

## 2023-04-21 DIAGNOSIS — Z0000 Encounter for general adult medical examination without abnormal findings: Secondary | ICD-10-CM

## 2023-04-21 NOTE — Progress Notes (Signed)
 Subjective:    Raneem Locascio is a 53 y.o. who presents for a Medicare Wellness preventive visit.  Visit Complete: Virtual I connected with  Ricketta Attar on 04/21/23 by a audio enabled telemedicine application and verified that I am speaking with the correct person using two identifiers.  Patient Location: Home  Provider Location: Office/Clinic  I discussed the limitations of evaluation and management by telemedicine. The patient expressed understanding and agreed to proceed.  Vital Signs: Because this visit was a virtual/telehealth visit, some criteria may be missing or patient reported. Any vitals not documented were not able to be obtained and vitals that have been documented are patient reported.  VideoDeclined- This patient declined Librarian, academic. Therefore the visit was completed with audio only.  Persons Participating in Visit: Patient.  AWV Questionnaire: No: Patient Medicare AWV questionnaire was not completed prior to this visit.  Cardiac Risk Factors include: advanced age (>46men, >24 women);dyslipidemia     Objective:    Today's Vitals   04/21/23 1056  Weight: 154 lb (69.9 kg)  Height: 5\' 7"  (1.702 m)   Body mass index is 24.12 kg/m.     04/21/2023   10:54 AM 01/22/2022    5:16 PM 05/03/2021    4:11 PM 05/04/2020    4:32 PM 03/23/2020    3:10 PM 09/24/2019    8:09 PM 06/23/2017    3:10 PM  Advanced Directives  Does Patient Have a Medical Advance Directive? No No No No No No No  Would patient like information on creating a medical advance directive? Yes (MAU/Ambulatory/Procedural Areas - Information given) No - Patient declined No - Patient declined No - Patient declined No - Patient declined  No - Patient declined    Current Medications (verified) Outpatient Encounter Medications as of 04/21/2023  Medication Sig   albuterol (VENTOLIN HFA) 108 (90 Base) MCG/ACT inhaler INHALE 2 PUFFS INTO THE LUNGS EVERY 6 HOURS AS NEEDED FOR  WHEEZING OR SHORTNESS OF BREATH   amLODipine (NORVASC) 2.5 MG tablet Take 1 tablet (2.5 mg total) by mouth daily.   atorvastatin (LIPITOR) 40 MG tablet Take 1 tablet (40 mg total) by mouth daily.   Cholecalciferol 50 MCG (2000 UT) TABS 1 tab by mouth once daily   clonazePAM (KLONOPIN) 0.5 MG tablet Take 1 tablet (0.5 mg total) by mouth 2 (two) times daily as needed for anxiety.   dicyclomine (BENTYL) 20 MG tablet TAKE 1 TABLET BY MOUTH EVERY 6 HOURS   estradiol (CLIMARA) 0.05 mg/24hr patch Place 1 patch (0.05 mg total) onto the skin once a week.   fluticasone-salmeterol (WIXELA INHUB) 250-50 MCG/ACT AEPB Inhale 1 puff into the lungs in the morning and at bedtime.   ibuprofen (ADVIL) 800 MG tablet TAKE 1 TABLET BY MOUTH EVERY 8 HOURS AS NEEDED   metoprolol succinate (TOPROL-XL) 50 MG 24 hr tablet TAKE 1 TABLET BY MOUTH WITH OR IMMEDIATELY FOLLOWING A MEAL   montelukast (SINGULAIR) 10 MG tablet Take 1 tablet (10 mg total) by mouth at bedtime.   naproxen (NAPROSYN) 500 MG tablet Take 1 tablet (500 mg total) by mouth 2 (two) times daily with a meal.   omeprazole (PRILOSEC) 40 MG capsule Take 1 capsule (40 mg total) by mouth daily.   phenazopyridine (PYRIDIUM) 97 MG tablet Take 1 tablet (97 mg total) by mouth 3 (three) times daily as needed for pain.   prednisoLONE acetate (PRED FORTE) 1 % ophthalmic suspension    predniSONE (DELTASONE) 10 MG tablet Take 4  tablets (40 mg total) by mouth daily with breakfast. Take 40 mg/day on day 1 and reduce dose by 10 mg every 2 days until taper is complete   predniSONE (DELTASONE) 50 MG tablet Take 1 tablet (50 mg total) by mouth daily.   Respiratory Therapy Supplies (FLUTTER) DEVI Use as directed.   RESTASIS 0.05 % ophthalmic emulsion    rOPINIRole (REQUIP) 0.5 MG tablet Take 1 tablet (0.5 mg total) by mouth at bedtime.   sodium chloride HYPERTONIC 3 % nebulizer solution Take by nebulization in the morning and at bedtime. J47.9   traMADol (ULTRAM) 50 MG tablet  Take 1 tablet (50 mg total) by mouth every 6 (six) hours as needed.   traZODone (DESYREL) 50 MG tablet Take 0.5-1 tablets (25-50 mg total) by mouth at bedtime as needed for sleep.   gabapentin (NEURONTIN) 100 MG capsule Take 1-3 capsules (100-300 mg total) by mouth 3 (three) times daily as needed.   No facility-administered encounter medications on file as of 04/21/2023.    Allergies (verified) Patient has no known allergies.   History: Past Medical History:  Diagnosis Date   Anemia    Bronchiectasis (HCC)    COPD (chronic obstructive pulmonary disease) (HCC)    GERD (gastroesophageal reflux disease)    Hyperlipidemia    Hypertension    Migraines    Paroxysmal SVT (supraventricular tachycardia) (HCC)    a. diagnosed in 11/2015.   Right leg numbness    Past Surgical History:  Procedure Laterality Date   CESAREAN SECTION     CYSTOSCOPY N/A 04/05/2016   Procedure: CYSTOSCOPY;  Surgeon: Willodean Rosenthal, MD;  Location: [facility];  Service: Gynecology;  Laterality: N/A;   LAPAROSCOPIC VAGINAL HYSTERECTOMY WITH SALPINGECTOMY Bilateral 04/05/2016   Procedure: LAPAROSCOPIC ASSISTED VAGINAL HYSTERECTOMY WITH SALPINGECTOMY;  Surgeon: Willodean Rosenthal, MD;  Location: [facility];  Service: Gynecology;  Laterality: Bilateral;   LUNG BIOPSY     VIDEO BRONCHOSCOPY Bilateral 03/29/2016   Procedure: VIDEO BRONCHOSCOPY WITHOUT FLUORO;  Surgeon: Chilton Greathouse, MD;  Location: [facility];  Service: Cardiopulmonary;  Laterality: Bilateral;   Family History  Problem Relation Age of Onset   Healthy Mother    Healthy Father    Colon cancer Neg Hx    Stomach cancer Neg Hx    Rectal cancer Neg Hx    Esophageal cancer Neg Hx    Social History   Socioeconomic History   Marital status: Married    Spouse name: Not on file   Number of children: 1   Years of education: 12   Highest education level: Not on file  Occupational History   Occupation: disability  Tobacco Use   Smoking status:  Never    Passive exposure: Never   Smokeless tobacco: Never  Vaping Use   Vaping status: Never Used  Substance and Sexual Activity   Alcohol use: No    Alcohol/week: 0.0 standard drinks of alcohol   Drug use: No   Sexual activity: Yes    Birth control/protection: Surgical  Other Topics Concern   Not on file  Social History Narrative   Fun: Watch TV   Social Drivers of Health   Financial Resource Strain: Low Risk  (04/21/2023)   Overall Financial Resource Strain (CARDIA)    Difficulty of Paying Living Expenses: Not hard at all  Food Insecurity: No Food Insecurity (04/21/2023)   Hunger Vital Sign    Worried About Running Out of Food in the Last Year: Never true    Ran Out of  Food in the Last Year: Never true  Transportation Needs: No Transportation Needs (04/21/2023)   PRAPARE - Administrator, Civil Service (Medical): No    Lack of Transportation (Non-Medical): No  Physical Activity: Insufficiently Active (04/21/2023)   Exercise Vital Sign    Days of Exercise per Week: 2 days    Minutes of Exercise per Session: 30 min  Stress: No Stress Concern Present (04/21/2023)   [employer] of Occupational Health - Occupational Stress Questionnaire    Feeling of Stress : Not at all  Social Connections: Socially Integrated (04/21/2023)   Social Connection and Isolation Panel [NHANES]    Frequency of Communication with Friends and Family: More than three times a week    Frequency of Social Gatherings with Friends and Family: More than three times a week    Attends Religious Services: More than 4 times per year    Active Member of [employer] or Organizations: Yes    Attends Engineer, structural: More than 4 times per year    Marital Status: Married    Tobacco Counseling Counseling given: No    Clinical Intake:  Pre-visit preparation completed: Yes  Pain : No/denies pain     BMI - recorded: 24.12 Nutritional Status: BMI of 19-24  Normal Nutritional Risks:  None Diabetes: No  Lab Results  Component Value Date   HGBA1C 6.1 (H) 02/25/2022   HGBA1C 6.5 03/25/2021   HGBA1C 5.8 03/23/2020     How often do you need to have someone help you when you read instructions, pamphlets, or other written materials from your doctor or pharmacy?: 1 - Never  Interpreter Needed?: No  Information entered by :: Hassell Halim, CMA   Activities of Daily Living     04/21/2023   10:59 AM  In your present state of health, do you have any difficulty performing the following activities:  Hearing? 0  Vision? 0  Difficulty concentrating or making decisions? 0  Walking or climbing stairs? 0  Dressing or bathing? 0  Doing errands, shopping? 0  Preparing Food and eating ? N  Using the Toilet? N  In the past six months, have you accidently leaked urine? N  Do you have problems with loss of bowel control? N  Managing your Medications? N  Managing your Finances? N  Housekeeping or managing your Housekeeping? N    Patient Care Team: Corwin Levins, MD as PCP - General (Internal Medicine) [place], Peter M, MD as PCP - Cardiology (Cardiology) [facility] [place], [place] (Ophthalmology) Lind Covert, MD as Referring Physician (Gynecology)  Indicate any recent Medical Services you may have received from other than [facility] providers in the past year (date may be approximate).     Assessment:   This is a routine wellness examination for Vannia.  Hearing/Vision screen Hearing Screening - Comments:: Denies hearing difficulties   Vision Screening - Comments:: Wears rx glasses - up to date with routine eye exams with [facility]   Goals Addressed               This Visit's Progress     Patient Stated (pt-stated)        Patient stated she plans to continue walking.       Depression Screen     04/21/2023   11:08 AM 07/19/2022    2:31 PM 02/25/2022    4:39 PM 05/03/2021    4:13 PM 03/25/2021    2:12 PM 03/25/2021    1:21  PM 03/23/2020    3:09 PM  PHQ 2/9  Scores  PHQ - 2 Score 0 0 1 0 0 0 0  PHQ- 9 Score 0          Fall Risk     04/21/2023   11:00 AM 07/19/2022    2:31 PM 02/25/2022    4:39 PM 07/14/2021    3:49 PM 03/25/2021    2:12 PM  Fall Risk   Falls in the past year? 0 0 0 0 0  Number falls in past yr: 0 0 0  0  Injury with Fall? 0 0 0 0 0  Risk for fall due to : No Fall Risks No Fall Risks     Follow up Falls prevention discussed;Falls evaluation completed Falls evaluation completed       MEDICARE RISK AT HOME:  Medicare Risk at Home Any stairs in or around the home?: No If so, are there any without handrails?: No Home free of loose throw rugs in walkways, pet beds, electrical cords, etc?: Yes Adequate lighting in your home to reduce risk of falls?: Yes Life alert?: No Use of a cane, walker or w/c?: No Grab bars in the bathroom?: No Shower chair or bench in shower?: No Elevated toilet seat or a handicapped toilet?: No  TIMED UP AND GO:  Was the test performed?  No  Cognitive Function: 6CIT completed        04/21/2023   11:00 AM 05/03/2021    4:28 PM  6CIT Screen  What Year? 0 points 0 points  What month? 0 points 0 points  What time? 0 points 0 points  Count back from 20 0 points 0 points  Months in reverse 0 points 0 points  Repeat phrase 0 points 0 points  Total Score 0 points 0 points    Immunizations Immunization History  Administered Date(s) Administered   Influenza,inj,Quad PF,6+ Mos 11/07/2015, 10/26/2016, 09/20/2017, 09/03/2018, 10/08/2019, 10/27/2020   PFIZER(Purple Top)SARS-COV-2 Vaccination 03/11/2019, 04/01/2019, 01/18/2020   Pneumococcal Polysaccharide-23 11/07/2015    Screening Tests Health Maintenance  Topic Date Due   Pneumococcal Vaccine 41-27 Years old (2 of 2 - PCV) 11/06/2016   Zoster Vaccines- Shingrix (1 of 2) Never done   Cervical Cancer Screening (HPV/Pap Cotest)  04/01/2021   COVID-19 Vaccine (4 - 2024-25 season) 09/11/2022   MAMMOGRAM  07/05/2023   INFLUENZA VACCINE   08/11/2023   Medicare Annual Wellness (AWV)  04/20/2024   Colonoscopy  02/20/2028   Hepatitis C Screening  Completed   HIV Screening  Completed   HPV VACCINES  Aged Out   Meningococcal B Vaccine  Aged Out   DTaP/Tdap/Td  Discontinued    Health Maintenance  Health Maintenance Due  Topic Date Due   Pneumococcal Vaccine 18-46 Years old (2 of 2 - PCV) 11/06/2016   Zoster Vaccines- Shingrix (1 of 2) Never done   Cervical Cancer Screening (HPV/Pap Cotest)  04/01/2021   COVID-19 Vaccine (4 - 2024-25 season) 09/11/2022   Health Maintenance Items Addressed: 04/21/2023  Cervical Cancer status: Completed 04/01/2016 ; pt stated will contact Dr Mendel Ryder (Gyn) to schedule an appt for repeat pap smear.   Additional Screening:  Vision Screening: Recommended annual ophthalmology exams for early detection of glaucoma and other disorders of the eye.  Dental Screening: Recommended annual dental exams for proper oral hygiene  Community Resource Referral / Chronic Care Management: CRR required this visit?  No   CCM required this visit?  No  Plan:     I have personally reviewed and noted the following in the patient's chart:   Medical and social history Use of alcohol, tobacco or illicit drugs  Current medications and supplements including opioid prescriptions. Patient is currently taking opioid prescriptions. Information provided to patient regarding non-opioid alternatives. Patient advised to discuss non-opioid treatment plan with their provider. Functional ability and status Nutritional status Physical activity Advanced directives List of other physicians Hospitalizations, surgeries, and ER visits in previous 12 months Vitals Screenings to include cognitive, depression, and falls Referrals and appointments  In addition, I have reviewed and discussed with patient certain preventive protocols, quality metrics, and best practice recommendations. A written personalized care plan for  preventive services as well as general preventive health recommendations were provided to patient.     Darreld Mclean, CMA   04/21/2023   After Visit Summary: (MyChart) Due to this being a telephonic visit, the after visit summary with patients personalized plan was offered to patient via MyChart   Notes: Nothing significant to report at this time.

## 2023-04-21 NOTE — Patient Instructions (Addendum)
 Ms. Takaki , Thank you for taking time to come for your Medicare Wellness Visit. I appreciate your ongoing commitment to your health goals. Please review the following plan we discussed and let me know if I can assist you in the future.   Referrals/Orders/Follow-Ups/Clinician Recommendations: Aim for 30 minutes of exercise or brisk walking, 6-8 glasses of water, and 5 servings of fruits and vegetables each day.   This is a list of the screening recommended for you and due dates:  Health Maintenance  Topic Date Due   Pneumococcal Vaccination (2 of 2 - PCV) 11/06/2016   Zoster (Shingles) Vaccine (1 of 2) Never done   Pap with HPV screening  04/01/2021   COVID-19 Vaccine (4 - 2024-25 season) 09/11/2022   Mammogram  07/05/2023   Flu Shot  08/11/2023   Medicare Annual Wellness Visit  04/20/2024   Colon Cancer Screening  02/20/2028   Hepatitis C Screening  Completed   HIV Screening  Completed   HPV Vaccine  Aged Out   Meningitis B Vaccine  Aged Out   DTaP/Tdap/Td vaccine  Discontinued    Advanced directives: (Provided) Advance directive discussed with you today. I have provided a copy for you to complete at home and have notarized. Once this is complete, please bring a copy in to our office so we can scan it into your chart.   Next Medicare Annual Wellness Visit scheduled for next year: Yes  Managing Pain Without Opioids Opioids are strong medicines used to treat moderate to severe pain. For some people, especially those who have long-term (chronic) pain, opioids may not be the best choice for pain management due to: Side effects like nausea, constipation, and sleepiness. The risk of addiction (opioid use disorder). The longer you take opioids, the greater your risk of addiction. Pain that lasts for more than 3 months is called chronic pain. Managing chronic pain usually requires more than one approach and is often provided by a team of health care providers working together  (multidisciplinary approach). Pain management may be done at a pain management center or pain clinic. How to manage pain without the use of opioids Use non-opioid medicines Non-opioid medicines for pain may include: Over-the-counter or prescription non-steroidal anti-inflammatory drugs (NSAIDs). These may be the first medicines used for pain. They work well for muscle and bone pain, and they reduce swelling. Acetaminophen. This over-the-counter medicine may work well for milder pain but not swelling. Antidepressants. These may be used to treat chronic pain. A certain type of antidepressant (tricyclics) is often used. These medicines are given in lower doses for pain than when used for depression. Anticonvulsants. These are usually used to treat seizures but may also reduce nerve (neuropathic) pain. Muscle relaxants. These relieve pain caused by sudden muscle tightening (spasms). You may also use a pain medicine that is applied to the skin as a patch, cream, or gel (topical analgesic), such as a numbing medicine. These may cause fewer side effects than medicines taken by mouth. Do certain therapies as directed Some therapies can help with pain management. They include: Physical therapy. You will do exercises to gain strength and flexibility. A physical therapist may teach you exercises to move and stretch parts of your body that are weak, stiff, or painful. You can learn these exercises at physical therapy visits and practice them at home. Physical therapy may also involve: Massage. Heat wraps or applying heat or cold to affected areas. Electrical signals that interrupt pain signals (transcutaneous electrical nerve stimulation, TENS).  Weak lasers that reduce pain and swelling (low-level laser therapy). Signals from your body that help you learn to regulate pain (biofeedback). Occupational therapy. This helps you to learn ways to function at home and work with less pain. Recreational therapy. This  involves trying new activities or hobbies, such as a physical activity or drawing. Mental health therapy, including: Cognitive behavioral therapy (CBT). This helps you learn coping skills for dealing with pain. Acceptance and commitment therapy (ACT) to change the way you think and react to pain. Relaxation therapies, including muscle relaxation exercises and mindfulness-based stress reduction. Pain management counseling. This may be individual, family, or group counseling.  Receive medical treatments Medical treatments for pain management include: Nerve block injections. These may include a pain blocker and anti-inflammatory medicines. You may have injections: Near the spine to relieve chronic back or neck pain. Into joints to relieve back or joint pain. Into nerve areas that supply a painful area to relieve body pain. Into muscles (trigger point injections) to relieve some painful muscle conditions. A medical device placed near your spine to help block pain signals and relieve nerve pain or chronic back pain (spinal cord stimulation device). Acupuncture. Follow these instructions at home Medicines Take over-the-counter and prescription medicines only as told by your health care provider. If you are taking pain medicine, ask your health care providers about possible side effects to watch out for. Do not drive or use heavy machinery while taking prescription opioid pain medicine. Lifestyle  Do not use drugs or alcohol to reduce pain. If you drink alcohol, limit how much you have to: 0-1 drink a day for women who are not pregnant. 0-2 drinks a day for men. Know how much alcohol is in a drink. In the [place] one drink equals one 12 oz bottle of beer (355 mL), one 5 oz glass of wine (148 mL), or one 1 oz glass of hard liquor (44 mL). Do not use any products that contain nicotine or tobacco. These products include cigarettes, chewing tobacco, and vaping devices, such as e-cigarettes. If you  need help quitting, ask your health care provider. Eat a healthy diet and maintain a healthy weight. Poor diet and excess weight may make pain worse. Eat foods that are high in fiber. These include fresh fruits and vegetables, whole grains, and beans. Limit foods that are high in fat and processed sugars, such as fried and sweet foods. Exercise regularly. Exercise lowers stress and may help relieve pain. Ask your health care provider what activities and exercises are safe for you. If your health care provider approves, join an exercise class that combines movement and stress reduction. Examples include yoga and tai chi. Get enough sleep. Lack of sleep may make pain worse. Lower stress as much as possible. Practice stress reduction techniques as told by your therapist. General instructions Work with all your pain management providers to find the treatments that work best for you. You are an important member of your pain management team. There are many things you can do to reduce pain on your own. Consider joining an online or in-person support group for people who have chronic pain. Keep all follow-up visits. This is important. Where to find more information You can find more information about managing pain without opioids from: American Academy of Pain Medicine: painmed.org Institute for Chronic Pain: instituteforchronicpain.org American Chronic Pain Association: theacpa.org Contact a health care provider if: You have side effects from pain medicine. Your pain gets worse or does not get better  with treatments or home therapy. You are struggling with anxiety or depression. Summary Many types of pain can be managed without opioids. Chronic pain may respond better to pain management without opioids. Pain is best managed when you and a team of health care providers work together. Pain management without opioids may include non-opioid medicines, medical treatments, physical therapy, mental health  therapy, and lifestyle changes. Tell your health care providers if your pain gets worse or is not being managed well enough. This information is not intended to replace advice given to you by your health care provider. Make sure you discuss any questions you have with your health care provider. Document Revised: 04/08/2020 Document Reviewed: 04/08/2020 Elsevier Patient Education  2024 [employer].

## 2023-06-01 ENCOUNTER — Other Ambulatory Visit: Payer: Self-pay | Admitting: Internal Medicine

## 2023-06-01 ENCOUNTER — Other Ambulatory Visit: Payer: Self-pay

## 2023-06-01 DIAGNOSIS — R002 Palpitations: Secondary | ICD-10-CM

## 2023-06-01 MED ORDER — METOPROLOL SUCCINATE ER 50 MG PO TB24
ORAL_TABLET | ORAL | 0 refills | Status: DC
Start: 2023-06-01 — End: 2023-09-12

## 2023-06-01 NOTE — Telephone Encounter (Unsigned)
 Copied from CRM [id number]. Topic: Clinical - Medication Refill >> Jun 01, 2023  2:48 PM Adrionna Y wrote: Medication:  metoprolol  succinate (TOPROL -XL) 50 MG 24 hr tablet   Has the patient contacted their pharmacy? Yes (Agent: If no, request that the patient contact the pharmacy for the refill. If patient does not wish to contact the pharmacy document the reason why and proceed with request.) (Agent: If yes, when and what did the pharmacy advise?)  This is the patient's preferred pharmacy:  [id number] - [place] - [address] [place] ([place]) [place] [address] Phone: [phone] Fax: [phone]   Is this the correct pharmacy for this prescription? Yes If no, delete pharmacy and type the correct one.   Has the prescription been filled recently? No  Is the patient out of the medication? Yes  Has the patient been seen for an appointment in the last year OR does the patient have an upcoming appointment? Yes  Can we respond through MyChart? Yes  Agent: Please be advised that Rx refills may take up to 3 business days. We ask that you follow-up with your pharmacy.

## 2023-09-08 ENCOUNTER — Other Ambulatory Visit: Payer: Self-pay | Admitting: Internal Medicine

## 2023-09-08 DIAGNOSIS — R002 Palpitations: Secondary | ICD-10-CM

## 2023-09-15 ENCOUNTER — Other Ambulatory Visit: Payer: Self-pay | Admitting: Internal Medicine

## 2023-10-06 ENCOUNTER — Other Ambulatory Visit: Payer: Self-pay | Admitting: Internal Medicine

## 2023-10-06 DIAGNOSIS — R109 Unspecified abdominal pain: Secondary | ICD-10-CM

## 2023-11-20 ENCOUNTER — Ambulatory Visit (INDEPENDENT_AMBULATORY_CARE_PROVIDER_SITE_OTHER): Admitting: Obstetrics and Gynecology

## 2023-11-20 VITALS — BP 114/69 | HR 82 | Ht 66.0 in | Wt 151.1 lb

## 2023-11-20 DIAGNOSIS — Z01419 Encounter for gynecological examination (general) (routine) without abnormal findings: Secondary | ICD-10-CM | POA: Diagnosis not present

## 2023-11-20 DIAGNOSIS — L304 Erythema intertrigo: Secondary | ICD-10-CM | POA: Diagnosis not present

## 2023-11-20 DIAGNOSIS — Z1231 Encounter for screening mammogram for malignant neoplasm of breast: Secondary | ICD-10-CM | POA: Diagnosis not present

## 2023-11-20 DIAGNOSIS — Z23 Encounter for immunization: Secondary | ICD-10-CM | POA: Diagnosis not present

## 2023-11-20 MED ORDER — CLOTRIMAZOLE 1 % EX CREA: 1.0000 | TOPICAL_CREAM | Freq: Two times a day (BID) | CUTANEOUS | 0 refills | Status: AC

## 2023-11-20 NOTE — Progress Notes (Signed)
 ANNUAL GYNECOLOGY VISIT Chief Complaint  Patient presents with   Gynecologic Exam    Annual exam     Subjective:  Hannah Barron is a 53 y.o. G1P1001 who presents for annual exam.  Reports itching in her groin on/off with irritation. No vaginal itching. Hx hysterectomy for fibroids.   Gyn History: Patient's last menstrual period was 03/31/2016. Last pap:  Lab Results  Component Value Date   DIAGPAP  04/01/2016    NEGATIVE FOR INTRAEPITHELIAL LESIONS OR MALIGNANCY.   HPV DETECTED (A) 04/01/2016  History of abnormal pap: denies abnormal pap procedures Last mammogram: 2024 Last colonoscopy: 2023 Last DEXA: never, denies risk factors   The pregnancy intention screening data noted above was reviewed. Potential methods of contraception were discussed. The patient elected to proceed with No data recorded.       04/21/2023   11:08 AM 07/19/2022    2:31 PM 02/25/2022    4:39 PM 05/03/2021    4:13 PM 03/25/2021    2:12 PM  Depression screen PHQ 2/9  Decreased Interest 0 0 0 0 0  Down, Depressed, Hopeless 0 0 1 0 0  PHQ - 2 Score 0 0 1 0 0  Altered sleeping 0      Tired, decreased energy 0      Change in appetite 0      Feeling bad or failure about yourself  0      Trouble concentrating 0      Moving slowly or fidgety/restless 0      Suicidal thoughts 0      PHQ-9 Score 0       Difficult doing work/chores Not difficult at all         Data saved with a previous flowsheet row definition        09/05/2017   10:46 AM 07/25/2017   11:03 AM 12/29/2016   12:58 PM 04/21/2016    8:13 AM  GAD 7 : Generalized Anxiety Score  Nervous, Anxious, on Edge 0 0  0  Control/stop worrying 0 0  0  Worry too much - different things 0 0 0 0  Trouble relaxing 1 1 3  0  Restless 0 0 0 0  Easily annoyed or irritable 0 0 0 0  Afraid - awful might happen 0 0 0 0  Total GAD 7 Score 1 1  0      OB History     Gravida  1   Para  1   Term  1   Preterm      AB      Living  1       SAB      IAB      Ectopic      Multiple      Live Births  1           Past Medical History:  Diagnosis Date   Anemia    Bronchiectasis (HCC)    COPD (chronic obstructive pulmonary disease) (HCC)    GERD (gastroesophageal reflux disease)    Hyperlipidemia    Hypertension    Migraines    Paroxysmal SVT (supraventricular tachycardia)    a. diagnosed in 11/2015.   Right leg numbness     Past Surgical History:  Procedure Laterality Date   CESAREAN SECTION     CYSTOSCOPY N/A 04/05/2016   Procedure: CYSTOSCOPY;  Surgeon: Elveria Mungo, MD;  Location: [facility];  Service: Gynecology;  Laterality: N/A;   LAPAROSCOPIC VAGINAL HYSTERECTOMY WITH  SALPINGECTOMY Bilateral 04/05/2016   Procedure: LAPAROSCOPIC ASSISTED VAGINAL HYSTERECTOMY WITH SALPINGECTOMY;  Surgeon: Elveria Mungo, MD;  Location: [facility];  Service: Gynecology;  Laterality: Bilateral;   LUNG BIOPSY     VIDEO BRONCHOSCOPY Bilateral 03/29/2016   Procedure: VIDEO BRONCHOSCOPY WITHOUT FLUORO;  Surgeon: Lonna Coder, MD;  Location: [facility];  Service: Cardiopulmonary;  Laterality: Bilateral;    Social History   Socioeconomic History   Marital status: Married    Spouse name: Not on file   Number of children: 1   Years of education: 12   Highest education level: Not on file  Occupational History   Occupation: disability  Tobacco Use   Smoking status: Never    Passive exposure: Never   Smokeless tobacco: Never  Vaping Use   Vaping status: Never Used  Substance and Sexual Activity   Alcohol use: No    Alcohol/week: 0.0 standard drinks of alcohol   Drug use: No   Sexual activity: Yes    Birth control/protection: Surgical  Other Topics Concern   Not on file  Social History Narrative   Fun: Watch TV   Social Drivers of Health   Financial Resource Strain: Low Risk  (04/21/2023)   Overall Financial Resource Strain (CARDIA)    Difficulty of Paying Living Expenses: Not hard at all  Food  Insecurity: No Food Insecurity (04/21/2023)   Hunger Vital Sign    Worried About Running Out of Food in the Last Year: Never true    Ran Out of Food in the Last Year: Never true  Transportation Needs: No Transportation Needs (04/21/2023)   PRAPARE - Administrator, Civil Service (Medical): No    Lack of Transportation (Non-Medical): No  Physical Activity: Insufficiently Active (04/21/2023)   Exercise Vital Sign    Days of Exercise per Week: 2 days    Minutes of Exercise per Session: 30 min  Stress: No Stress Concern Present (04/21/2023)   [employer] of Occupational Health - Occupational Stress Questionnaire    Feeling of Stress : Not at all  Social Connections: Socially Integrated (04/21/2023)   Social Connection and Isolation Panel    Frequency of Communication with Friends and Family: More than three times a week    Frequency of Social Gatherings with Friends and Family: More than three times a week    Attends Religious Services: More than 4 times per year    Active Member of [employer] or Organizations: Yes    Attends Engineer, Structural: More than 4 times per year    Marital Status: Married    Family History  Problem Relation Age of Onset   Healthy Mother    Healthy Father    Colon cancer Neg Hx    Stomach cancer Neg Hx    Rectal cancer Neg Hx    Esophageal cancer Neg Hx     Current Outpatient Medications on File Prior to Visit  Medication Sig Dispense Refill   albuterol  (VENTOLIN  HFA) 108 (90 Base) MCG/ACT inhaler INHALE 2 PUFFS INTO THE LUNGS EVERY 6 HOURS AS NEEDED FOR WHEEZING OR SHORTNESS OF BREATH 8 g 11   amLODipine  (NORVASC ) 2.5 MG tablet Take 1 tablet (2.5 mg total) by mouth daily. 30 tablet 0   atorvastatin  (LIPITOR) 40 MG tablet Take 1 tablet by mouth once daily 90 tablet 0   Cholecalciferol  50 MCG (2000 UT) TABS 1 tab by mouth once daily 30 tablet 99   clonazePAM  (KLONOPIN ) 0.5 MG tablet Take 1 tablet (0.5  mg total) by mouth 2 (two) times  daily as needed for anxiety. 60 tablet 0   dicyclomine  (BENTYL ) 20 MG tablet TAKE 1 TABLET BY MOUTH EVERY 6 HOURS 240 tablet 0   fluticasone -salmeterol (WIXELA INHUB) 250-50 MCG/ACT AEPB Inhale 1 puff into the lungs in the morning and at bedtime. 180 each 3   ibuprofen  (ADVIL ) 800 MG tablet TAKE 1 TABLET BY MOUTH EVERY 8 HOURS AS NEEDED 40 tablet 0   metoprolol  succinate (TOPROL -XL) 50 MG 24 hr tablet TAKE 1 TABLET BY MOUTH WITH OR IMMEDIATELY FOLLOWING A MEAL. 90 tablet 0   omeprazole  (PRILOSEC) 40 MG capsule Take 1 capsule (40 mg total) by mouth daily. 90 capsule 3   estradiol  (CLIMARA ) 0.05 mg/24hr patch Place 1 patch (0.05 mg total) onto the skin once a week. (Patient not taking: Reported on 11/20/2023) 4 patch 12   gabapentin  (NEURONTIN ) 100 MG capsule Take 1-3 capsules (100-300 mg total) by mouth 3 (three) times daily as needed. 90 capsule 1   montelukast  (SINGULAIR ) 10 MG tablet Take 1 tablet (10 mg total) by mouth at bedtime. (Patient not taking: Reported on 11/20/2023) 30 tablet 3   naproxen  (NAPROSYN ) 500 MG tablet Take 1 tablet (500 mg total) by mouth 2 (two) times daily with a meal. (Patient not taking: Reported on 11/20/2023) 60 tablet 3   phenazopyridine  (PYRIDIUM ) 97 MG tablet Take 1 tablet (97 mg total) by mouth 3 (three) times daily as needed for pain. 10 tablet 0   prednisoLONE acetate (PRED FORTE) 1 % ophthalmic suspension  (Patient not taking: Reported on 11/20/2023)     predniSONE  (DELTASONE ) 10 MG tablet Take 4 tablets (40 mg total) by mouth daily with breakfast. Take 40 mg/day on day 1 and reduce dose by 10 mg every 2 days until taper is complete (Patient not taking: Reported on 11/20/2023) 50 tablet 0   predniSONE  (DELTASONE ) 50 MG tablet Take 1 tablet (50 mg total) by mouth daily. (Patient not taking: Reported on 11/20/2023) 5 tablet 0   Respiratory Therapy Supplies (FLUTTER) DEVI Use as directed. 1 each 0   RESTASIS 0.05 % ophthalmic emulsion   2   rOPINIRole  (REQUIP ) 0.5 MG  tablet Take 1 tablet (0.5 mg total) by mouth at bedtime. 30 tablet 1   sodium chloride  HYPERTONIC 3 % nebulizer solution Take by nebulization in the morning and at bedtime. J47.9 750 mL 11   traMADol  (ULTRAM ) 50 MG tablet Take 1 tablet (50 mg total) by mouth every 6 (six) hours as needed. (Patient not taking: Reported on 11/20/2023) 30 tablet 0   traZODone  (DESYREL ) 50 MG tablet Take 0.5-1 tablets (25-50 mg total) by mouth at bedtime as needed for sleep. 90 tablet 1   No current facility-administered medications on file prior to visit.    No Known Allergies   Objective:   Vitals:   11/20/23 1544  BP: 114/69  Pulse: 82  Weight: 151 lb 1.3 oz (68.5 kg)  Height: 5' 6 (1.676 m)   Physical Examination:   General appearance - well appearing, and in no distress  Mental status - alert, oriented to person, place, and time  Psych:  normal mood and affect  Skin - warm and dry, normal color, groin with irritation/hyperpigmentation and scant white exudate  Breasts - breasts appear normal, no suspicious masses, no skin or nipple changes or  axillary nodes  Abdomen - soft, nontender, nondistended, no masses or organomegaly  Pelvic -  VULVA: normal appearing vulva with no masses, tenderness  or lesions   VAGINA: normal appearing vagina with normal color and discharge, no lesions   CERVIX: surgically absent  UTERUS: surgically absent  ADNEXA: No adnexal masses or tenderness noted.  Extremities:  No swelling or varicosities noted  Chaperone present for exam  Assessment and Plan:  1. Encounter for well woman exam with routine gynecological exam (Primary) No pap indicated Mammo ordered Colonoscopy up to date DEXA age 21  2. Encounter for screening mammogram for breast cancer - MM 3D SCREENING MAMMOGRAM BILATERAL BREAST; Future  3. Intertrigo Groin itching likely intertrigo, will try cream, if not improving, f/u with PCP - clotrimazole  (LOTRIMIN ) 1 % cream; Apply 1 Application topically 2  (two) times daily for 14 days.  Dispense: 28 g; Refill: 0   No follow-ups on file.  Future Appointments  Date Time Provider Department Center  11/21/2023  9:15 AM Mannam, Praveen, MD [facility] [address]  11/21/2023  2:20 PM Norleen Lynwood ORN, MD [facility]  11/22/2023  1:45 PM Leonce Katz, DO [facility] None  04/22/2024 11:30 AM [facility]NUAL WELLNESS VISIT 2 [facility] [address]o ONEIDA Abigail, MD, FACOG Obstetrician & Gynecologist, [facility] for [facility], [facility]

## 2023-11-20 NOTE — Progress Notes (Signed)
 Patient presents for Annual.  LMP: Patient's last menstrual period was 03/31/2016.  Last pap: Date: 04/01/2016 abnormal Contraception: None Mammogram: Due, last mammogram: 07/04/2021 STD Screening: Accepts Flu Vaccine : Accepts  CC: Annual and mammo needed   Fun Fact: going to [place] in feb to get married  Shawnee Fleet, CMA

## 2023-11-20 NOTE — Addendum Note (Signed)
 Addended by: JOMARIE SKIPPER D on: 11/20/2023 04:46 PM   Modules accepted: Orders

## 2023-11-21 ENCOUNTER — Ambulatory Visit: Payer: Self-pay | Admitting: Internal Medicine

## 2023-11-21 ENCOUNTER — Ambulatory Visit (INDEPENDENT_AMBULATORY_CARE_PROVIDER_SITE_OTHER): Admitting: Pulmonary Disease

## 2023-11-21 ENCOUNTER — Ambulatory Visit: Admitting: Internal Medicine

## 2023-11-21 ENCOUNTER — Encounter: Payer: Self-pay | Admitting: Pulmonary Disease

## 2023-11-21 ENCOUNTER — Encounter: Payer: Self-pay | Admitting: Internal Medicine

## 2023-11-21 VITALS — BP 96/63 | HR 66 | Temp 98.3°F | Ht 66.0 in | Wt 152.0 lb

## 2023-11-21 VITALS — BP 110/62 | HR 70 | Temp 98.9°F | Ht 66.0 in | Wt 152.0 lb

## 2023-11-21 DIAGNOSIS — R10816 Epigastric abdominal tenderness: Secondary | ICD-10-CM

## 2023-11-21 DIAGNOSIS — E559 Vitamin D deficiency, unspecified: Secondary | ICD-10-CM

## 2023-11-21 DIAGNOSIS — R1012 Left upper quadrant pain: Secondary | ICD-10-CM | POA: Diagnosis not present

## 2023-11-21 DIAGNOSIS — M542 Cervicalgia: Secondary | ICD-10-CM

## 2023-11-21 DIAGNOSIS — J209 Acute bronchitis, unspecified: Secondary | ICD-10-CM

## 2023-11-21 DIAGNOSIS — J479 Bronchiectasis, uncomplicated: Secondary | ICD-10-CM | POA: Diagnosis not present

## 2023-11-21 DIAGNOSIS — J471 Bronchiectasis with (acute) exacerbation: Secondary | ICD-10-CM

## 2023-11-21 LAB — CBC WITH DIFFERENTIAL/PLATELET
Basophils Absolute: 0 K/uL (ref 0.0–0.1)
Basophils Relative: 0.7 % (ref 0.0–3.0)
Eosinophils Absolute: 0.1 K/uL (ref 0.0–0.7)
Eosinophils Relative: 2 % (ref 0.0–5.0)
HCT: 37.8 % (ref 36.0–46.0)
Hemoglobin: 12.8 g/dL (ref 12.0–15.0)
Lymphocytes Relative: 39 % (ref 12.0–46.0)
Lymphs Abs: 1.8 K/uL (ref 0.7–4.0)
MCHC: 33.8 g/dL (ref 30.0–36.0)
MCV: 96.6 fl (ref 78.0–100.0)
Monocytes Absolute: 0.4 K/uL (ref 0.1–1.0)
Monocytes Relative: 9.8 % (ref 3.0–12.0)
Neutro Abs: 2.2 K/uL (ref 1.4–7.7)
Neutrophils Relative %: 48.5 % (ref 43.0–77.0)
Platelets: 236 K/uL (ref 150.0–400.0)
RBC: 3.91 Mil/uL (ref 3.87–5.11)
RDW: 12.5 % (ref 11.5–15.5)
WBC: 4.5 K/uL (ref 4.0–10.5)

## 2023-11-21 LAB — BASIC METABOLIC PANEL WITH GFR
BUN: 12 mg/dL (ref 6–23)
CO2: 28 meq/L (ref 19–32)
Calcium: 9.4 mg/dL (ref 8.4–10.5)
Chloride: 103 meq/L (ref 96–112)
Creatinine, Ser: 0.73 mg/dL (ref 0.40–1.20)
GFR: 93.71 mL/min (ref >60.00)
Glucose, Bld: 85 mg/dL (ref 70–99)
Potassium: 3.6 meq/L (ref 3.5–5.1)
Sodium: 141 meq/L (ref 135–145)

## 2023-11-21 LAB — URINALYSIS, ROUTINE W REFLEX MICROSCOPIC
Bilirubin Urine: NEGATIVE
Hgb urine dipstick: NEGATIVE
Ketones, ur: NEGATIVE
Leukocytes,Ua: NEGATIVE
Nitrite: NEGATIVE
Specific Gravity, Urine: 1.02 (ref 1.000–1.030)
Total Protein, Urine: NEGATIVE
Urine Glucose: NEGATIVE
Urobilinogen, UA: 0.2 (ref 0.0–1.0)
pH: 6 (ref 5.0–8.0)

## 2023-11-21 LAB — HEPATIC FUNCTION PANEL
ALT: 21 U/L (ref 0–35)
AST: 23 U/L (ref 0–37)
Albumin: 4.1 g/dL (ref 3.5–5.2)
Alkaline Phosphatase: 86 U/L (ref 39–117)
Bilirubin, Direct: 0.1 mg/dL (ref 0.0–0.3)
Total Bilirubin: 0.2 mg/dL (ref 0.2–1.2)
Total Protein: 7.4 g/dL (ref 6.0–8.3)

## 2023-11-21 LAB — LIPASE: Lipase: 34 U/L (ref 11.0–59.0)

## 2023-11-21 MED ORDER — FLUTICASONE-SALMETEROL 250-50 MCG/ACT IN AEPB: 1.0000 | INHALATION_SPRAY | Freq: Two times a day (BID) | RESPIRATORY_TRACT | 3 refills | Status: AC

## 2023-11-21 MED ORDER — CYCLOBENZAPRINE HCL 5 MG PO TABS
5.0000 mg | ORAL_TABLET | Freq: Three times a day (TID) | ORAL | 1 refills | Status: AC | PRN
Start: 2023-11-21 — End: ?

## 2023-11-21 MED ORDER — ALBUTEROL SULFATE HFA 108 (90 BASE) MCG/ACT IN AERS: INHALATION_SPRAY | RESPIRATORY_TRACT | 11 refills | Status: AC

## 2023-11-21 MED ORDER — TRAMADOL HCL 50 MG PO TABS: 50.0000 mg | ORAL_TABLET | Freq: Four times a day (QID) | ORAL | 0 refills | Status: AC | PRN

## 2023-11-21 MED ORDER — PREDNISONE 10 MG PO TABS: 40.0000 mg | ORAL_TABLET | Freq: Every day | ORAL | 0 refills | Status: AC

## 2023-11-21 MED ORDER — SODIUM CHLORIDE 3 % IN NEBU: INHALATION_SOLUTION | Freq: Two times a day (BID) | RESPIRATORY_TRACT | 11 refills | Status: AC

## 2023-11-21 MED ORDER — AZITHROMYCIN 250 MG PO TABS
ORAL_TABLET | ORAL | 0 refills | Status: AC
Start: 2023-11-21 — End: ?

## 2023-11-21 MED ORDER — PANTOPRAZOLE SODIUM 40 MG PO TBEC: 40.0000 mg | DELAYED_RELEASE_TABLET | Freq: Every day | ORAL | 3 refills | Status: AC

## 2023-11-21 MED ORDER — MEFLOQUINE HCL 250 MG PO TABS: ORAL_TABLET | ORAL | 0 refills | Status: AC

## 2023-11-21 NOTE — Patient Instructions (Signed)
 Please take all new medication as prescribed  - the protonix  for stomach acid, tramadol  as needed for pain, flexeril  as needed for muscle relaxer  Please continue all other medications as before, and refills have been done for mefloquin  Please have the pharmacy call with any other refills you may need.  Please continue your efforts at being more active, low cholesterol diet, and weight control  Please keep your appointments with your specialists as you may have planned  You will be contacted regarding the referral for: CT scan  Please go to the LAB at the blood drawing area for the tests to be done  You will be contacted by phone if any changes need to be made immediately.  Otherwise, you will receive a letter about your results with an explanation, but please check with MyChart first.  Please make an Appointment to return in 6 months, or sooner if needed

## 2023-11-21 NOTE — Progress Notes (Signed)
 The test results show that your current treatment is OK, as the tests are stable.  Please continue the same plan.  There is no other need for change of treatment or further evaluation based on these results, at this time.  thanks

## 2023-11-21 NOTE — Progress Notes (Unsigned)
 Patient ID: Hannah Barron, female   DOB: 07/05/1970, 53 y.o.   MRN: [id number]        Chief Complaint: follow up left flank pain, epigastric tender, right post lateral neck pain, low vit d        HPI:  Hannah Barron is a 53 y.o. female here with c/o 2 days onset left flank pain with radiation to the LUQ but without n/v, fever, chills, GI or GU symptoms or blood.  Has occasional epigastric discomfort.  Also incidentally also with right post lat neck pain and sore tenderness mild to mod constant, worse to move the head left.  Denies worsening dysphagia, n/v, bowel change or blood.        Wt Readings from Last 3 Encounters:  11/22/23 152 lb (68.9 kg)  11/21/23 152 lb (68.9 kg)  11/21/23 152 lb (68.9 kg)   BP Readings from Last 3 Encounters:  11/21/23 110/62  11/21/23 96/63  11/20/23 114/69         Past Medical History:  Diagnosis Date   Anemia    Bronchiectasis (HCC)    COPD (chronic obstructive pulmonary disease) (HCC)    GERD (gastroesophageal reflux disease)    Hyperlipidemia    Hypertension    Migraines    Paroxysmal SVT (supraventricular tachycardia)    a. diagnosed in 11/2015.   Right leg numbness    Past Surgical History:  Procedure Laterality Date   CESAREAN SECTION     CYSTOSCOPY N/A 04/05/2016   Procedure: CYSTOSCOPY;  Surgeon: Elveria Mungo, MD;  Location: [facility];  Service: Gynecology;  Laterality: N/A;   LAPAROSCOPIC VAGINAL HYSTERECTOMY WITH SALPINGECTOMY Bilateral 04/05/2016   Procedure: LAPAROSCOPIC ASSISTED VAGINAL HYSTERECTOMY WITH SALPINGECTOMY;  Surgeon: Elveria Mungo, MD;  Location: [facility];  Service: Gynecology;  Laterality: Bilateral;   LUNG BIOPSY     VIDEO BRONCHOSCOPY Bilateral 03/29/2016   Procedure: VIDEO BRONCHOSCOPY WITHOUT FLUORO;  Surgeon: Lonna Coder, MD;  Location: [facility];  Service: Cardiopulmonary;  Laterality: Bilateral;    reports that she has never smoked. She has never been exposed to tobacco smoke. She has never used  smokeless tobacco. She reports that she does not drink alcohol and does not use drugs. family history includes Healthy in her father and mother. No Known Allergies Current Outpatient Medications on File Prior to Visit  Medication Sig Dispense Refill   albuterol  (VENTOLIN  HFA) 108 (90 Base) MCG/ACT inhaler INHALE 2 PUFFS INTO THE LUNGS EVERY 6 HOURS AS NEEDED FOR WHEEZING OR SHORTNESS OF BREATH 8 g 11   atorvastatin  (LIPITOR) 40 MG tablet Take 1 tablet by mouth once daily 90 tablet 0   azithromycin  (ZITHROMAX ) 250 MG tablet Take 500 mg on day 1 and then 250 mg/day for the next 4 days 6 tablet 0   clonazePAM  (KLONOPIN ) 0.5 MG tablet Take 1 tablet (0.5 mg total) by mouth 2 (two) times daily as needed for anxiety. 60 tablet 0   clotrimazole  (LOTRIMIN ) 1 % cream Apply 1 Application topically 2 (two) times daily for 14 days. 28 g 0   dicyclomine  (BENTYL ) 20 MG tablet TAKE 1 TABLET BY MOUTH EVERY 6 HOURS 240 tablet 0   fluticasone -salmeterol (WIXELA INHUB) 250-50 MCG/ACT AEPB Inhale 1 puff into the lungs in the morning and at bedtime. 180 each 3   ibuprofen  (ADVIL ) 800 MG tablet TAKE 1 TABLET BY MOUTH EVERY 8 HOURS AS NEEDED 40 tablet 0   metoprolol  succinate (TOPROL -XL) 50 MG 24 hr tablet TAKE 1 TABLET BY MOUTH WITH OR  IMMEDIATELY FOLLOWING A MEAL. 90 tablet 0   omeprazole  (PRILOSEC) 40 MG capsule Take 1 capsule (40 mg total) by mouth daily. 90 capsule 3   phenazopyridine  (PYRIDIUM ) 97 MG tablet Take 1 tablet (97 mg total) by mouth 3 (three) times daily as needed for pain. 10 tablet 0   predniSONE  (DELTASONE ) 10 MG tablet Take 4 tablets (40 mg total) by mouth daily with breakfast for 5 days. 20 tablet 0   Respiratory Therapy Supplies (FLUTTER) DEVI Use as directed. 1 each 0   RESTASIS 0.05 % ophthalmic emulsion   2   rOPINIRole  (REQUIP ) 0.5 MG tablet Take 1 tablet (0.5 mg total) by mouth at bedtime. 30 tablet 1   sodium chloride  HYPERTONIC 3 % nebulizer solution Take by nebulization in the morning and  at bedtime. J47.9 750 mL 11   No current facility-administered medications on file prior to visit.        ROS:  All others reviewed and negative.  Objective        PE:  BP 110/62 (BP Location: Right Arm, Patient Position: Sitting, Cuff Size: Normal)   Pulse 70   Temp 98.9 F (37.2 C) (Oral)   Ht 5' 6 (1.676 m)   Wt 152 lb (68.9 kg)   LMP 03/31/2016   SpO2 99%   BMI 24.53 kg/m                 Constitutional: Pt appears in NAD               HENT: Head: NCAT.                Right Ear: External ear normal.                 Left Ear: External ear normal.                Eyes: . Pupils are equal, round, and reactive to light. Conjunctivae and EOM are normal               Nose: without d/c or deformity               Neck: Neck supple. Gross normal ROM               Cardiovascular: Normal rate and regular rhythm.                 Pulmonary/Chest: Effort normal and breath sounds without rales or wheezing.                Abd:  Soft, mild epigastric tender, ND, + BS, no organomegaly               Neurological: Pt is alert. At baseline orientation, motor grossly intact               Skin: Skin is warm. No rashes, no other new lesions, LE edema - none               Psychiatric: Pt behavior is normal without agitation   Micro: none  Cardiac tracings I have personally interpreted today:  none  Pertinent Radiological findings (summarize): none   Lab Results  Component Value Date   WBC 4.5 11/21/2023   HGB 12.8 11/21/2023   HCT 37.8 11/21/2023   PLT 236.0 11/21/2023   GLUCOSE 85 11/21/2023   CHOL 197 02/25/2022   TRIG 67 02/25/2022   HDL 75 02/25/2022   LDLCALC 107 (H) 02/25/2022   ALT 21  11/21/2023   AST 23 11/21/2023   NA 141 11/21/2023   K 3.6 11/21/2023   CL 103 11/21/2023   CREATININE 0.73 11/21/2023   BUN 12 11/21/2023   CO2 28 11/21/2023   TSH 0.92 02/25/2022   INR 1.1 10/27/2014   HGBA1C 6.1 (H) 02/25/2022   Assessment/Plan:  Hannah Barron is a 53 y.o. Other or two  or more races [6] female with  has a past medical history of Anemia, Bronchiectasis (HCC), COPD (chronic obstructive pulmonary disease) (HCC), GERD (gastroesophageal reflux disease), Hyperlipidemia, Hypertension, Migraines, Paroxysmal SVT (supraventricular tachycardia), and Right leg numbness.  Vitamin D  deficiency Last vitamin D  Lab Results  Component Value Date   VD25OH 35 02/25/2022   Low, to start oral replacement   Neck pain on right side Cw msk strain - for flexeril  5 tid prn  Abdominal tenderness, epigastric Etiology unclear but suspect gastritis - for protonix  40 mg qd  Left upper quadrant abdominal pain Left flank with radiation to the luq - can't r/o renal stone, for renal cT, lab including cbc and ua  Followup: Return in about 6 months (around 05/20/2024).  Lynwood Rush, MD 11/23/2023 8:22 PM [facility]

## 2023-11-21 NOTE — Progress Notes (Unsigned)
 Ben Jackson D.[facility] [address] [place] [address] Phone: [phone]   Assessment and Plan:     1. Chronic right shoulder pain (Primary) -Chronic with exacerbation, subsequent visit - Consistent with recurrent right shoulder pain leading to reduced ROM, pain radiating into arm, intermittent radicular symptoms.  I suspect rotator cuff pathology is most likely based on pain with shoulder motions, pain with side sleeping, patient's physically active job in caregiving - Patient has had some relief with PT and subacromial CSI in the past.  Elected for repeat CSI today.  Tolerated well per note below - Start meloxicam  15 mg daily x2 weeks.  If still having pain after 2 weeks, complete 3rd-week of NSAID. May use remaining NSAID as needed once daily for pain control.  Do not to use additional over-the-counter NSAIDs (ibuprofen , naproxen , Advil , Aleve , etc.) while taking prescription NSAIDs.  May use Tylenol  [phone] mg 2 to 3 times a day for breakthrough pain.-Start HEP for shoulder and rotator cuff  Procedure: Subacromial Injection Side: Right  Risks explained and consent was given verbally. The site was cleaned with alcohol prep. A steroid injection was performed from posterior approach using 2mL of 1% lidocaine  without epinephrine and 1mL of kenalog 40mg /ml. This was well tolerated.  Needle was removed, hemostasis achieved, and post injection instructions were explained.   Pt was advised to call or return to clinic if these symptoms worsen or fail to improve as anticipated.    15 additional minutes spent for educating Therapeutic Home Exercise Program.  This included exercises focusing on stretching, strengthening, with focus on eccentric aspects.   Long term goals include an improvement in range of motion, strength, endurance as well as avoiding reinjury. Patient's frequency would include in 1-2 times a day, 3-5 times a week for a duration of 6-12  weeks. Proper technique shown and discussed handout in great detail with ATC.  All questions were discussed and answered.    Pertinent previous records reviewed include none   Follow Up: As needed.  Patient typically leaves for 6 months for her work and then returns to [place]  for 1 week for doctors appointments.  May follow-up as needed for further evaluation and could consider additional CSI   Subjective:   I, Chestine Reeves, am serving as a neurosurgeon for Doctor Morene Mace  Chief Complaint: arm pain   HPI:   11/22/2023 Patient is a 53 year old female with arm pain. Patient states right arm pain. Decreased ROM. Pain starts in the shoulder and down to the bicep. Ibu 800mg  does not help. Isnt able to lay on her right side. Notes she feels weak on the right side. She does not drop things. Does not some intermittent numbness and tingling.    Relevant Historical Information: COPD, GERD  Additional pertinent review of systems negative.   Current Outpatient Medications:    meloxicam  (MOBIC ) 15 MG tablet, Take 1 tablet daily for 2 weeks.  If still in pain after 2 weeks, take 1 tablet daily for an additional 1 week., Disp: 30 tablet, Rfl: 0   albuterol  (VENTOLIN  HFA) 108 (90 Base) MCG/ACT inhaler, INHALE 2 PUFFS INTO THE LUNGS EVERY 6 HOURS AS NEEDED FOR WHEEZING OR SHORTNESS OF BREATH, Disp: 8 g, Rfl: 11   atorvastatin  (LIPITOR) 40 MG tablet, Take 1 tablet by mouth once daily, Disp: 90 tablet, Rfl: 0   azithromycin  (ZITHROMAX ) 250 MG tablet, Take 500 mg on day 1 and then 250 mg/day  for the next 4 days, Disp: 6 tablet, Rfl: 0   clonazePAM  (KLONOPIN ) 0.5 MG tablet, Take 1 tablet (0.5 mg total) by mouth 2 (two) times daily as needed for anxiety., Disp: 60 tablet, Rfl: 0   clotrimazole  (LOTRIMIN ) 1 % cream, Apply 1 Application topically 2 (two) times daily for 14 days., Disp: 28 g, Rfl: 0   cyclobenzaprine  (FLEXERIL ) 5 MG tablet, Take 1 tablet (5 mg total) by mouth 3 (three) times daily  as needed., Disp: 40 tablet, Rfl: 1   dicyclomine  (BENTYL ) 20 MG tablet, TAKE 1 TABLET BY MOUTH EVERY 6 HOURS, Disp: 240 tablet, Rfl: 0   fluticasone -salmeterol (WIXELA INHUB) 250-50 MCG/ACT AEPB, Inhale 1 puff into the lungs in the morning and at bedtime., Disp: 180 each, Rfl: 3   ibuprofen  (ADVIL ) 800 MG tablet, TAKE 1 TABLET BY MOUTH EVERY 8 HOURS AS NEEDED, Disp: 40 tablet, Rfl: 0   mefloquine  (LARIAM ) 250 MG tablet, Take one tab by mouth once weekly starting 1 week prior to travel, Disp: 11 tablet, Rfl: 0   metoprolol  succinate (TOPROL -XL) 50 MG 24 hr tablet, TAKE 1 TABLET BY MOUTH WITH OR IMMEDIATELY FOLLOWING A MEAL., Disp: 90 tablet, Rfl: 0   omeprazole  (PRILOSEC) 40 MG capsule, Take 1 capsule (40 mg total) by mouth daily., Disp: 90 capsule, Rfl: 3   pantoprazole  (PROTONIX ) 40 MG tablet, Take 1 tablet (40 mg total) by mouth daily., Disp: 90 tablet, Rfl: 3   phenazopyridine  (PYRIDIUM ) 97 MG tablet, Take 1 tablet (97 mg total) by mouth 3 (three) times daily as needed for pain., Disp: 10 tablet, Rfl: 0   predniSONE  (DELTASONE ) 10 MG tablet, Take 4 tablets (40 mg total) by mouth daily with breakfast for 5 days., Disp: 20 tablet, Rfl: 0   Respiratory Therapy Supplies (FLUTTER) DEVI, Use as directed., Disp: 1 each, Rfl: 0   RESTASIS 0.05 % ophthalmic emulsion, , Disp: , Rfl: 2   rOPINIRole  (REQUIP ) 0.5 MG tablet, Take 1 tablet (0.5 mg total) by mouth at bedtime., Disp: 30 tablet, Rfl: 1   sodium chloride  HYPERTONIC 3 % nebulizer solution, Take by nebulization in the morning and at bedtime. J47.9, Disp: 750 mL, Rfl: 11   traMADol  (ULTRAM ) 50 MG tablet, Take 1 tablet (50 mg total) by mouth every 6 (six) hours as needed., Disp: 30 tablet, Rfl: 0   Objective:     Vitals:   11/22/23 1327  Pulse: 86  SpO2: 97%  Weight: 152 lb (68.9 kg)  Height: 5' 6 (1.676 m)      Body mass index is 24.53 kg/m.    Physical Exam:    Gen: Appears well, nad, nontoxic and pleasant Neuro:sensation intact,  strength is 5/5, muscle tone wnl Skin: no suspicious lesion or defmority Psych: A&O, appropriate mood and affect  Right shoulder:  No deformity, swelling or muscle wasting No scapular winging FF 160, abd 160, int 20, ext 90 TTP deltoid, biceps NTTP over the [place], clavicle, ac, coracoid, humerus,  , trapezius, cervical spine Positive neer, hawkins, empty can, obriens, crossarm Negative, subscap liftoff, speeds Neg ant drawer, sulcus sign, apprehension Negative Spurling's test bilat FROM of neck    Electronically signed by:  Odis Mace D[facility] 2:04 PM 11/22/23

## 2023-11-21 NOTE — Progress Notes (Signed)
 Hannah Barron    [id number]    01-03-1971  Primary Care Physician:John, Lynwood ORN, MD  Referring Physician: Norleen Lynwood ORN, MD [address] [place],  [place] [address]  Chief complaint:  Follow up for  Cystic bronchiectasis  HPI: 53 y.o.  with chronic obstructive lung disease, bronchiectasis. She was previously followed at [place]  with a VATS biopsy in 2003 and was told she had cystic bronchiectasis. She had followed up with a pulmonologist at [place]  but has moved to [place] on 2017. We have been unable to get records from [place]  in spite of multiple attempts.  She is an immigrant from [place] in 1998 and does not report any exposure to tuberculosis. She does not recall getting a BCG vaccine as a child. Underwent a bronchoscopy to evaluate for MAI infection and a positive quantiferon as she was unable to give a good sputum sample. All results are negative except for positive PJP on DFA. She underwent further evaluation with negative HIV and beta glucan tests.  The PJP test was thought to be false positive She had a hysterectomy for uterine fibroids  On 3/27  She has separated from her husband in 2021 and reports significant improvement in stress and breathing issues since then. She is back to work now full-time with [employer].    Interim history: She is doing well.  Breathing is stable though she reports increased cough with congestion, yellow mucus for the past week.  No fevers or chills Working as a facilities manager for an elderly couple in [place] .  She is taking care of herself and exercising daily. Symbicort  changed to Wixela due to insurance reasons.   Outpatient Encounter Medications as of 11/21/2023  Medication Sig   albuterol  (VENTOLIN  HFA) 108 (90 Base) MCG/ACT inhaler INHALE 2 PUFFS INTO THE LUNGS EVERY 6 HOURS AS NEEDED FOR WHEEZING OR SHORTNESS OF BREATH   atorvastatin  (LIPITOR) 40 MG tablet Take 1 tablet by mouth once daily   fluticasone -salmeterol (WIXELA  INHUB) 250-50 MCG/ACT AEPB Inhale 1 puff into the lungs in the morning and at bedtime.   ibuprofen  (ADVIL ) 800 MG tablet TAKE 1 TABLET BY MOUTH EVERY 8 HOURS AS NEEDED   metoprolol  succinate (TOPROL -XL) 50 MG 24 hr tablet TAKE 1 TABLET BY MOUTH WITH OR IMMEDIATELY FOLLOWING A MEAL.   omeprazole  (PRILOSEC) 40 MG capsule Take 1 capsule (40 mg total) by mouth daily.   phenazopyridine  (PYRIDIUM ) 97 MG tablet Take 1 tablet (97 mg total) by mouth 3 (three) times daily as needed for pain.   Respiratory Therapy Supplies (FLUTTER) DEVI Use as directed.   RESTASIS 0.05 % ophthalmic emulsion    rOPINIRole  (REQUIP ) 0.5 MG tablet Take 1 tablet (0.5 mg total) by mouth at bedtime.   sodium chloride  HYPERTONIC 3 % nebulizer solution Take by nebulization in the morning and at bedtime. J47.9   amLODipine  (NORVASC ) 2.5 MG tablet Take 1 tablet (2.5 mg total) by mouth daily. (Patient not taking: Reported on 11/21/2023)   Cholecalciferol  50 MCG (2000 UT) TABS 1 tab by mouth once daily (Patient not taking: Reported on 11/21/2023)   clonazePAM  (KLONOPIN ) 0.5 MG tablet Take 1 tablet (0.5 mg total) by mouth 2 (two) times daily as needed for anxiety.   clotrimazole  (LOTRIMIN ) 1 % cream Apply 1 Application topically 2 (two) times daily for 14 days.   dicyclomine  (BENTYL ) 20 MG tablet TAKE 1 TABLET BY MOUTH EVERY 6 HOURS   estradiol  (CLIMARA ) 0.05 mg/24hr patch  Place 1 patch (0.05 mg total) onto the skin once a week. (Patient not taking: Reported on 11/21/2023)   gabapentin  (NEURONTIN ) 100 MG capsule Take 1-3 capsules (100-300 mg total) by mouth 3 (three) times daily as needed. (Patient not taking: Reported on 11/21/2023)   montelukast  (SINGULAIR ) 10 MG tablet Take 1 tablet (10 mg total) by mouth at bedtime. (Patient not taking: Reported on 11/21/2023)   naproxen  (NAPROSYN ) 500 MG tablet Take 1 tablet (500 mg total) by mouth 2 (two) times daily with a meal. (Patient not taking: Reported on 11/21/2023)   prednisoLONE acetate  (PRED FORTE) 1 % ophthalmic suspension  (Patient not taking: Reported on 11/21/2023)   predniSONE  (DELTASONE ) 10 MG tablet Take 4 tablets (40 mg total) by mouth daily with breakfast. Take 40 mg/day on day 1 and reduce dose by 10 mg every 2 days until taper is complete (Patient not taking: Reported on 11/21/2023)   predniSONE  (DELTASONE ) 50 MG tablet Take 1 tablet (50 mg total) by mouth daily. (Patient not taking: Reported on 11/21/2023)   traMADol  (ULTRAM ) 50 MG tablet Take 1 tablet (50 mg total) by mouth every 6 (six) hours as needed. (Patient not taking: Reported on 11/21/2023)   traZODone  (DESYREL ) 50 MG tablet Take 0.5-1 tablets (25-50 mg total) by mouth at bedtime as needed for sleep. (Patient not taking: Reported on 11/21/2023)   No facility-administered encounter medications on file as of 11/21/2023.   Physical Exam: Blood pressure 96/63, pulse 66, temperature 98.3 F (36.8 C), temperature source Oral, height 5' 6 (1.676 m), weight 152 lb (68.9 kg), last menstrual period 03/31/2016, SpO2 98%. Gen:      No acute distress HEENT:  EOMI, sclera anicteric Neck:     No masses; no thyromegaly Lungs:    Clear to auscultation bilaterally; normal respiratory effort CV:         Regular rate and rhythm; no murmurs Abd:      + bowel sounds; soft, non-tender; no palpable masses, no distension Ext:    No edema; adequate peripheral perfusion Neuro: alert and oriented x 3 Psych: normal mood and affect   Data Reviewed: Imaging CT high resolution 07/16/15-areas of cystic and varicose bronchiectasis, extensive air trapping, multiple pulmonary nodules. CT chest 03/21/16-areas of bronchiectasis, air trapping and pulmonary nodules. Unchanged compared to 2017 CT angiogram 12/23/16-stable areas of bronchiectasis, air trapping and pulmonary nodules.  No pulmonary embolism. CT chest 05/14/2020-mild progression of right middle lobe bronchiectasis.  Stable pulmonary nodules High resolution CT 07/19/2022-appears  stable on my review I have reviewed the images personally.  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 Discussed INH therapy but defer as per pt wishes CF panel 03/25/15- negative for 97 mutations analyzed (report scanned)  Cardiac Myocardial perfusion study 12/2015 >neg  Ischemia , EF 55% Echo 10/2015 >EF 65%.  Echo 11/2018 > EF 65%, normal valves, no wall motion abnormalities   Bronchoscopy 03/29/16 Cultures, AFB, fungal cultures-negative to date PJP DFA-positive Cytology-no malignant cells, CD4: CD8 ratio-1.62 Cell count WBC-26, 26% lymphs, 50% neutrophils, 24% monocyte macrophage  Assessment:  Cystic bronchiectasis Acute bronchitis  She's had workup which is negative for alpha-1 antitrypsin, immunoglobulin deficiency, autoimmune disease, cystic fibrosis and mycobacterial infections.   Continues on [place], albuterol  as needed. Mucociliary clearance with percussion vest, flutter valve, hypertonic saline.  Can use Mucinex over-the-counter as needed Hypertonic saline nebs  Give Z-Pak and prednisone  40 mg a day for 5 days for acute bronchitis  Plan/Recommendations: - Continue Wixela, albuterol  - Continue Flutter valve,  Mucinex, percussion vest - Hypertonic saline nebs - Z-Pak, prednisone   Lendy Dittrich MD [facility] 11/21/2023, 9:12 AM   CC: Norleen Lynwood ORN, MD

## 2023-11-21 NOTE — Patient Instructions (Addendum)
 I am glad you are doing well with your breathing Will order Z-Pak and prednisone  for bronchitis Will also renew your inhaler, hypertonic saline nebs Continue to use a flutter valve, percussion vest Follow-up in 6 months

## 2023-11-22 ENCOUNTER — Ambulatory Visit: Admitting: Sports Medicine

## 2023-11-22 ENCOUNTER — Other Ambulatory Visit (HOSPITAL_COMMUNITY): Payer: Self-pay

## 2023-11-22 ENCOUNTER — Telehealth: Payer: Self-pay

## 2023-11-22 VITALS — HR 86 | Ht 66.0 in | Wt 152.0 lb

## 2023-11-22 DIAGNOSIS — G8929 Other chronic pain: Secondary | ICD-10-CM

## 2023-11-22 DIAGNOSIS — M25511 Pain in right shoulder: Secondary | ICD-10-CM

## 2023-11-22 MED ORDER — MELOXICAM 15 MG PO TABS: ORAL_TABLET | ORAL | 0 refills | Status: AC

## 2023-11-22 NOTE — Telephone Encounter (Signed)
*  Pulm  Pharmacy Patient Advocate Encounter   Received notification from CoverMyMeds that prior authorization for Sodium Chloride  3% nebulizer solution  is required/requested.   Insurance verification completed.   The patient is insured through [employer].   Per test claim: Medication is not eligible for pharmacy benefits and must be billed through medical insurance. As our team only handles pharmacy related prior auths, medical PA's must be submitted by the clinic. Thank you

## 2023-11-22 NOTE — Patient Instructions (Signed)
-   Start meloxicam  15 mg daily x2 weeks.  If still having pain after 2 weeks, complete 3rd-week of NSAID. May use remaining NSAID as needed once daily for pain control.  Do not to use additional over-the-counter NSAIDs (ibuprofen , naproxen, Advil , Aleve, etc.) while taking prescription NSAIDs.  May use Tylenol  [phone] mg 2 to 3 times a day for breakthrough pain. Shoulder HEP  As needed follow up

## 2023-11-23 ENCOUNTER — Ambulatory Visit
Admission: RE | Admit: 2023-11-23 | Discharge: 2023-11-23 | Disposition: A | Discharge status: Home / Self Care | Source: Ambulatory Visit | Attending: Internal Medicine | Admitting: Internal Medicine

## 2023-11-23 ENCOUNTER — Encounter: Payer: Self-pay | Admitting: Internal Medicine

## 2023-11-23 ENCOUNTER — Encounter (HOSPITAL_BASED_OUTPATIENT_CLINIC_OR_DEPARTMENT_OTHER): Payer: Self-pay

## 2023-11-23 ENCOUNTER — Ambulatory Visit (HOSPITAL_BASED_OUTPATIENT_CLINIC_OR_DEPARTMENT_OTHER)
Admission: RE | Admit: 2023-11-23 | Discharge: 2023-11-23 | Disposition: A | Discharge status: Home / Self Care | Source: Ambulatory Visit | Attending: Obstetrics and Gynecology | Admitting: Obstetrics and Gynecology

## 2023-11-23 DIAGNOSIS — M542 Cervicalgia: Secondary | ICD-10-CM | POA: Insufficient documentation

## 2023-11-23 DIAGNOSIS — R109 Unspecified abdominal pain: Secondary | ICD-10-CM | POA: Diagnosis not present

## 2023-11-23 DIAGNOSIS — R10816 Epigastric abdominal tenderness: Secondary | ICD-10-CM | POA: Insufficient documentation

## 2023-11-23 DIAGNOSIS — H524 Presbyopia: Secondary | ICD-10-CM | POA: Diagnosis not present

## 2023-11-23 DIAGNOSIS — H52223 Regular astigmatism, bilateral: Secondary | ICD-10-CM | POA: Diagnosis not present

## 2023-11-23 DIAGNOSIS — R1012 Left upper quadrant pain: Secondary | ICD-10-CM | POA: Insufficient documentation

## 2023-11-23 DIAGNOSIS — Z1231 Encounter for screening mammogram for malignant neoplasm of breast: Secondary | ICD-10-CM | POA: Insufficient documentation

## 2023-11-23 NOTE — Assessment & Plan Note (Signed)
Last vitamin D Lab Results  Component Value Date   VD25OH 35 02/25/2022   Low, to start oral replacement

## 2023-11-23 NOTE — Assessment & Plan Note (Signed)
C/w msk strain - for flexeril 5 tid prn

## 2023-11-23 NOTE — Assessment & Plan Note (Signed)
 Etiology unclear but suspect gastritis - for protonix  40 mg qd

## 2023-11-23 NOTE — Assessment & Plan Note (Signed)
 Left flank with radiation to the luq - can't r/o renal stone, for renal cT, lab including cbc and ua

## 2023-11-24 NOTE — Telephone Encounter (Signed)
 Patient aware out of pocket expense. NFN

## 2023-12-13 NOTE — Telephone Encounter (Signed)
 I was able to speak with the pt and inform her of PC advise as follows The test results show that your current treatment is OK, as the tests are stable.  Please continue the same plan.   There is no other need for change of treatment or further evaluation based on these results, at this time.  Thanks  Pt has stated understanding and has no questions or concerns at this time.

## 2023-12-14 ENCOUNTER — Ambulatory Visit: Admitting: Pulmonary Disease

## 2023-12-26 ENCOUNTER — Other Ambulatory Visit: Payer: Self-pay

## 2023-12-26 ENCOUNTER — Other Ambulatory Visit: Payer: Self-pay | Admitting: Internal Medicine

## 2023-12-26 DIAGNOSIS — R002 Palpitations: Secondary | ICD-10-CM

## 2023-12-26 NOTE — Telephone Encounter (Unsigned)
 Copied from CRM #[id number]. Topic: Clinical - Medication Refill >> Dec 26, 2023  2:21 PM Rosina BIRCH wrote: Medication: metoprolol  succinate, ibuprofen , atorvastatin  and dicyclomine   Has the patient contacted their pharmacy? Yes (Agent: If no, request that the patient contact the pharmacy for the refill. If patient does not wish to contact the pharmacy document the reason why and proceed with request.) (Agent: If yes, when and what did the pharmacy advise?)  This is the patient's preferred pharmacy:  [id number] - [place] - [address] [place] ([place]) [place] [address] Phone: [phone] Fax: [phone]  Is this the correct pharmacy for this prescription? Yes If no, delete pharmacy and type the correct one.   Has the prescription been filled recently? No  Is the patient out of the medication? Yes  Has the patient been seen for an appointment in the last year OR does the patient have an upcoming appointment? Yes  Can we respond through MyChart? Yes  Agent: Please be advised that Rx refills may take up to 3 business days. We ask that you follow-up with your pharmacy.

## 2024-02-01 ENCOUNTER — Ambulatory Visit: Payer: Self-pay | Admitting: *Deleted

## 2024-02-01 ENCOUNTER — Other Ambulatory Visit: Payer: Self-pay | Admitting: Internal Medicine

## 2024-02-01 DIAGNOSIS — K219 Gastro-esophageal reflux disease without esophagitis: Secondary | ICD-10-CM

## 2024-02-01 DIAGNOSIS — R109 Unspecified abdominal pain: Secondary | ICD-10-CM

## 2024-02-01 DIAGNOSIS — R002 Palpitations: Secondary | ICD-10-CM

## 2024-02-01 MED ORDER — IBUPROFEN 800 MG PO TABS: 800.0000 mg | ORAL_TABLET | Freq: Three times a day (TID) | ORAL | 0 refills | Status: AC | PRN

## 2024-02-01 MED ORDER — METOPROLOL SUCCINATE ER 50 MG PO TB24: ORAL_TABLET | ORAL | 0 refills | Status: AC

## 2024-02-01 MED ORDER — OMEPRAZOLE 40 MG PO CPDR: 40.0000 mg | DELAYED_RELEASE_CAPSULE | Freq: Every day | ORAL | 3 refills | Status: AC

## 2024-02-01 MED ORDER — ATORVASTATIN CALCIUM 40 MG PO TABS: 40.0000 mg | ORAL_TABLET | Freq: Every day | ORAL | 0 refills | Status: AC

## 2024-02-01 MED ORDER — DICYCLOMINE HCL 20 MG PO TABS: 20.0000 mg | ORAL_TABLET | Freq: Four times a day (QID) | ORAL | 0 refills | Status: AC

## 2024-02-01 NOTE — Telephone Encounter (Signed)
" °  FYI Only or Action Required?: FYI only for provider: UC advised .  Patient was last seen in primary care on 11/21/2023 by Norleen Lynwood ORN, MD.  Called Nurse Triage reporting Sinusitis.  Symptoms began several weeks ago.  Interventions attempted: Rest, hydration, or home remedies.  Symptoms are: gradually worsening.  Triage Disposition: See HCP Within 4 Hours (Or PCP Triage)  Patient/caregiver understands and will follow disposition?: Yes  Message from [facility] sent at 02/01/2024  9:59 AM EST  Reason for Triage: When patient blows nose has a little blood in mucus, bad headache pressure in head and in forehead, pain is 7 took pain medication but won't go away   Reason for Disposition  [1] SEVERE sinus pain (e.g., excruciating) AND [2] not improved 2 hours after pain medicine  Answer Assessment - Initial Assessment Questions [place]- unable to do VV with provider due to restrictions- patient advised UC for sinus symptoms.   1. LOCATION: Where does it hurt?      Pain in forehead- pressure present 2. ONSET: When did the sinus pain start?  (e.g., hours, days)      3 weeks ago 3. SEVERITY: How bad is the pain?   (Scale 0-10; or none, mild, moderate or severe)     7/10 4. RECURRENT SYMPTOM: Have you ever had sinus problems before? If Yes, ask: When was the last time? and What happened that time?      no 5. NASAL CONGESTION: Is the nose blocked? If Yes, ask: Can you open it or must you breathe through your mouth?     Able to clear nose 6. NASAL DISCHARGE: Do you have discharge from your nose? If so ask, What color?     Yes- no color- sometimes red 7. FEVER: Do you have a fever? If Yes, ask: What is it, how was it measured, and when did it start?      No 8. OTHER SYMPTOMS: Do you have any other symptoms? (e.g., sore throat, cough, earache, difficulty breathing)     no  Protocols used: Sinus Pain or Congestion-A-AH  "

## 2024-02-01 NOTE — Telephone Encounter (Signed)
 Patient was triaged for sinus symptoms earlier today- she is working out of state now and was advised UC. Patient states she is having trouble getting her refills for some of her medications. Patient would like PCP to review- she was in office 11/21/23 and advised return in 6 months. Patient states she always comes to her appointments. Please review for refill.

## 2024-04-22 ENCOUNTER — Ambulatory Visit

## 2024-05-22 ENCOUNTER — Ambulatory Visit
